# Patient Record
Sex: Male | Born: 1960 | Hispanic: No | Marital: Single | Smoking: Never smoker
Health system: Southern US, Community
[De-identification: ages and names within clinical notes are randomized; demographics above are authoritative.]

## PROBLEM LIST (undated history)

## (undated) DIAGNOSIS — I10 Essential (primary) hypertension: Secondary | ICD-10-CM

## (undated) DIAGNOSIS — F319 Bipolar disorder, unspecified: Secondary | ICD-10-CM

## (undated) DIAGNOSIS — F250 Schizoaffective disorder, bipolar type: Secondary | ICD-10-CM

## (undated) DIAGNOSIS — K759 Inflammatory liver disease, unspecified: Secondary | ICD-10-CM

## (undated) DIAGNOSIS — F32A Depression, unspecified: Secondary | ICD-10-CM

## (undated) DIAGNOSIS — F259 Schizoaffective disorder, unspecified: Secondary | ICD-10-CM

## (undated) DIAGNOSIS — F419 Anxiety disorder, unspecified: Secondary | ICD-10-CM

## (undated) DIAGNOSIS — F329 Major depressive disorder, single episode, unspecified: Secondary | ICD-10-CM

## (undated) DIAGNOSIS — E119 Type 2 diabetes mellitus without complications: Secondary | ICD-10-CM

## (undated) HISTORY — PX: WISDOM TOOTH EXTRACTION: SHX21

## (undated) HISTORY — PX: FINGER SURGERY: SHX640

---

## 2006-05-23 ENCOUNTER — Inpatient Hospital Stay (HOSPITAL_COMMUNITY): Admission: EM | Admit: 2006-05-23 | Discharge: 2006-06-01 | Payer: Self-pay | Admitting: *Deleted

## 2006-05-23 ENCOUNTER — Ambulatory Visit: Payer: Self-pay | Admitting: *Deleted

## 2010-06-06 ENCOUNTER — Inpatient Hospital Stay (HOSPITAL_COMMUNITY)
Admission: AD | Admit: 2010-06-06 | Discharge: 2010-06-18 | Payer: Self-pay | Source: Home / Self Care | Admitting: Psychiatry

## 2010-06-06 ENCOUNTER — Ambulatory Visit: Payer: Self-pay | Admitting: Psychiatry

## 2010-06-06 ENCOUNTER — Emergency Department (HOSPITAL_COMMUNITY): Admission: EM | Admit: 2010-06-06 | Discharge: 2010-06-06 | Payer: Self-pay | Admitting: Emergency Medicine

## 2010-09-21 ENCOUNTER — Inpatient Hospital Stay (HOSPITAL_COMMUNITY)
Admission: RE | Admit: 2010-09-21 | Discharge: 2010-09-30 | DRG: 885 | Disposition: A | Discharge status: Home / Self Care | Payer: PRIVATE HEALTH INSURANCE | Source: Ambulatory Visit | Attending: Psychiatry | Admitting: Psychiatry

## 2010-09-21 ENCOUNTER — Emergency Department (HOSPITAL_COMMUNITY)
Admission: EM | Admit: 2010-09-21 | Discharge: 2010-09-21 | Disposition: A | Discharge status: Other Acute Inpatient Hospital | Payer: Self-pay | Attending: Emergency Medicine | Admitting: Emergency Medicine

## 2010-09-21 DIAGNOSIS — Z818 Family history of other mental and behavioral disorders: Secondary | ICD-10-CM

## 2010-09-21 DIAGNOSIS — F3289 Other specified depressive episodes: Secondary | ICD-10-CM

## 2010-09-21 DIAGNOSIS — Z79899 Other long term (current) drug therapy: Secondary | ICD-10-CM | POA: Insufficient documentation

## 2010-09-21 DIAGNOSIS — R45851 Suicidal ideations: Secondary | ICD-10-CM | POA: Insufficient documentation

## 2010-09-21 DIAGNOSIS — F1910 Other psychoactive substance abuse, uncomplicated: Secondary | ICD-10-CM

## 2010-09-21 DIAGNOSIS — E119 Type 2 diabetes mellitus without complications: Secondary | ICD-10-CM | POA: Insufficient documentation

## 2010-09-21 DIAGNOSIS — R197 Diarrhea, unspecified: Secondary | ICD-10-CM

## 2010-09-21 DIAGNOSIS — Z5900 Homelessness unspecified: Secondary | ICD-10-CM

## 2010-09-21 DIAGNOSIS — E1149 Type 2 diabetes mellitus with other diabetic neurological complication: Secondary | ICD-10-CM

## 2010-09-21 DIAGNOSIS — R110 Nausea: Secondary | ICD-10-CM

## 2010-09-21 DIAGNOSIS — F29 Unspecified psychosis not due to a substance or known physiological condition: Secondary | ICD-10-CM

## 2010-09-21 DIAGNOSIS — B1920 Unspecified viral hepatitis C without hepatic coma: Secondary | ICD-10-CM

## 2010-09-21 DIAGNOSIS — E669 Obesity, unspecified: Secondary | ICD-10-CM

## 2010-09-21 DIAGNOSIS — Z8659 Personal history of other mental and behavioral disorders: Secondary | ICD-10-CM | POA: Insufficient documentation

## 2010-09-21 DIAGNOSIS — E1142 Type 2 diabetes mellitus with diabetic polyneuropathy: Secondary | ICD-10-CM

## 2010-09-21 DIAGNOSIS — F319 Bipolar disorder, unspecified: Secondary | ICD-10-CM | POA: Insufficient documentation

## 2010-09-21 DIAGNOSIS — F329 Major depressive disorder, single episode, unspecified: Secondary | ICD-10-CM

## 2010-09-21 DIAGNOSIS — F259 Schizoaffective disorder, unspecified: Principal | ICD-10-CM

## 2010-09-21 LAB — RAPID URINE DRUG SCREEN, HOSP PERFORMED
Benzodiazepines: NOT DETECTED
Tetrahydrocannabinol: POSITIVE — AB

## 2010-09-21 LAB — DIFFERENTIAL
Basophils Relative: 0 % (ref 0–1)
Lymphs Abs: 1.3 10*3/uL (ref 0.7–4.0)
Monocytes Relative: 9 % (ref 3–12)
Neutro Abs: 3.9 10*3/uL (ref 1.7–7.7)
Neutrophils Relative %: 68 % (ref 43–77)

## 2010-09-21 LAB — BASIC METABOLIC PANEL
BUN: 12 mg/dL (ref 6–23)
Chloride: 103 mEq/L (ref 96–112)
GFR calc non Af Amer: >60 mL/min (ref >60)
Potassium: 4 mEq/L (ref 3.5–5.1)
Sodium: 137 mEq/L (ref 135–145)

## 2010-09-21 LAB — CBC
Hemoglobin: 12.6 g/dL — ABNORMAL LOW (ref 13.0–17.0)
MCH: 29.2 pg (ref 26.0–34.0)
RBC: 4.32 MIL/uL (ref 4.22–5.81)
WBC: 5.8 10*3/uL (ref 4.0–10.5)

## 2010-09-21 LAB — ETHANOL: Alcohol, Ethyl (B): <5 mg/dL (ref 0–10)

## 2010-09-22 DIAGNOSIS — F329 Major depressive disorder, single episode, unspecified: Secondary | ICD-10-CM

## 2010-09-22 DIAGNOSIS — F3289 Other specified depressive episodes: Secondary | ICD-10-CM

## 2010-09-22 LAB — GLUCOSE, CAPILLARY
Glucose-Capillary: 223 mg/dL — ABNORMAL HIGH (ref 70–99)
Glucose-Capillary: 224 mg/dL — ABNORMAL HIGH (ref 70–99)
Glucose-Capillary: 227 mg/dL — ABNORMAL HIGH (ref 70–99)

## 2010-09-23 LAB — GLUCOSE, CAPILLARY
Glucose-Capillary: 246 mg/dL — ABNORMAL HIGH (ref 70–99)
Glucose-Capillary: 320 mg/dL — ABNORMAL HIGH (ref 70–99)

## 2010-09-23 LAB — HEMOGLOBIN A1C
Hgb A1c MFr Bld: 10.3 % — ABNORMAL HIGH (ref <5.7)
Mean Plasma Glucose: 249 mg/dL — ABNORMAL HIGH (ref <117)

## 2010-09-24 LAB — GLUCOSE, CAPILLARY
Glucose-Capillary: 227 mg/dL — ABNORMAL HIGH (ref 70–99)
Glucose-Capillary: 207 mg/dL — ABNORMAL HIGH (ref 70–99)
Glucose-Capillary: 187 mg/dL — ABNORMAL HIGH (ref 70–99)

## 2010-09-25 LAB — GLUCOSE, CAPILLARY: Glucose-Capillary: 122 mg/dL — ABNORMAL HIGH (ref 70–99)

## 2010-09-26 DIAGNOSIS — F329 Major depressive disorder, single episode, unspecified: Secondary | ICD-10-CM

## 2010-09-26 DIAGNOSIS — F3289 Other specified depressive episodes: Secondary | ICD-10-CM

## 2010-09-26 LAB — CBC
Hemoglobin: 12.5 g/dL — ABNORMAL LOW (ref 13.0–17.0)
MCH: 28.7 pg (ref 26.0–34.0)
MCHC: 33.4 g/dL (ref 30.0–36.0)
MCV: 86 fL (ref 78.0–100.0)
Platelets: 209 10*3/uL (ref 150–400)

## 2010-09-26 LAB — GLUCOSE, CAPILLARY
Glucose-Capillary: 189 mg/dL — ABNORMAL HIGH (ref 70–99)
Glucose-Capillary: 260 mg/dL — ABNORMAL HIGH (ref 70–99)

## 2010-09-26 LAB — BASIC METABOLIC PANEL
BUN: 9 mg/dL (ref 6–23)
CO2: 26 mEq/L (ref 19–32)
Calcium: 9 mg/dL (ref 8.4–10.5)
Creatinine, Ser: 0.95 mg/dL (ref 0.4–1.5)
GFR calc Af Amer: >60 mL/min (ref >60)

## 2010-09-27 LAB — GLUCOSE, CAPILLARY
Glucose-Capillary: 180 mg/dL — ABNORMAL HIGH (ref 70–99)
Glucose-Capillary: 165 mg/dL — ABNORMAL HIGH (ref 70–99)

## 2010-09-28 LAB — GLUCOSE, CAPILLARY: Glucose-Capillary: 80 mg/dL (ref 70–99)

## 2010-09-29 LAB — GLUCOSE, CAPILLARY: Glucose-Capillary: 165 mg/dL — ABNORMAL HIGH (ref 70–99)

## 2010-09-30 LAB — GLUCOSE, CAPILLARY
Glucose-Capillary: 196 mg/dL — ABNORMAL HIGH (ref 70–99)
Glucose-Capillary: 157 mg/dL — ABNORMAL HIGH (ref 70–99)

## 2010-10-03 NOTE — Discharge Summary (Signed)
[name]              ACCOUNT NO.:  [id number]  MEDICAL RECORD NO.:  [id number]           PATIENT TYPE:  I  LOCATION:  0504                          FACILITY:  [facility]  PHYSICIAN:  Marlis Edelson, DO        DATE OF BIRTH:  [birth year]  DATE OF ADMISSION:  09/21/2010 DATE OF DISCHARGE:  09/30/2010                              DISCHARGE SUMMARY   REASON FOR HOSPITALIZATION:  This was a 50 year old male that presented with depression, suicidal thoughts and psychotic symptoms.  Had had past multiple admissions for psychiatric illnesses.  FINAL DIAGNOSES:  Axis I:  Schizoaffective disorder, depressed, with psychosis.  Polysubstance abuse, in remission. Axis II:  Negative. Axis III:  A history of diabetes type 2 uncontrolled, hepatitis C untreated, diabetic neuropathy. Axis IV: Homelessness, financial problems and medical problems and access to care. Axis V: GAF is 55-60.  LABORATORY DATA:  CBC and differential was unremarkable, white count of 12.6, hematocrit 36.3.  Alcohol level was less than 5.  Glucose was elevated at 267.  SIGNIFICANT FINDINGS:  This is an alert, oriented male in x3, obese, endorsing suicidality with a plan to shoot himself.  Means were unavailable.  No history of any homicidality.  Speech was clear, coherent, goal-directed.  Mood was depressed.  Endorsed command and denigrating auditory hallucinations.  He appears to be of at least average intelligence.  HOSPITAL COURSE:  Our plan was to admit him to the adult milieu for 3-5 days for stabilization and monitor his blood sugars.  He continued to endorse hearing voices that were telling him he was no good and hurt yourself.  We had ordered Risperdal to control his psychotic symptoms and had trazodone for sleep.  The patient developed some GI symptoms, problems with nausea, vomiting, which he felt was the change in medications.  His blood sugars were elevated and we were monitoring his blood sugars with  sliding scale insulin.  Endorsing auditory hallucinations of command type to hurt himself.  He promised safety on the unit.  Lantus was initiated to monitor his blood sugars and we had Zofran and Imodium for his nausea and diarrhea.  The patient improved, was less nauseated, diarrhea had subsided.  His blood sugars were under better control.  He was doing well with his medications.  Denied any psychotic symptoms.  We were addressing placement and we continue with the Risperdal and trazodone.  He was beginning to feel better, attending groups, denying any suicidal or homicidal thoughts.  He was hoping to catch a train and go to [place] to stay with family.  CONDITION ON DISCHARGE:  The patient's GI symptoms had improved.  His diarrhea had stopped.  His heartburn was better with Protonix.  He denied any suicidal or homicidal thoughts or psychotic symptoms.  Sleep had improved.  His blood sugars were stabilizing.  Tolerating his medications and felt they were effective.  He was again feeling great. His appetite had improved.  The patient was fully alert and cooperative with casual dress, good eye contact and making futuristic plans for himself, asking appropriate questions.  Had plans for medical follow-up in  [place].  DISCHARGE MEDICATIONS: 1. Prozac 20 mg 2 tablets daily. 2. Protonix 40 mg daily. 3. Risperdal 1 mg taking 1 in the morning and 1-1/2 pills at bedtime. 4. Cogentin 1 mg daily. 5. Glipizide 10 mg daily. 6. Metformin 1000 mg 1 tablet the twice daily. 7. Trazodone 100 mg for sleep.  His follow-up appointments were at [facility], phone number [phone].  The patient had appointment on Monday at 5 p.m.     [name], N.P.   ______________________________ Marlis Edelson, DO    JO/MEDQ  D:  10/01/2010  T:  10/01/2010  Job:  [id number]  Electronically Signed by Limmie PatriciaP. on 10/02/2010 11:35:57 AM Electronically Signed by Marlis Edelson MD on  10/02/2010 08:30:30 PM

## 2010-10-08 LAB — GLUCOSE, CAPILLARY
Glucose-Capillary: 172 mg/dL — ABNORMAL HIGH (ref 70–99)
Glucose-Capillary: 196 mg/dL — ABNORMAL HIGH (ref 70–99)
Glucose-Capillary: 203 mg/dL — ABNORMAL HIGH (ref 70–99)
Glucose-Capillary: 204 mg/dL — ABNORMAL HIGH (ref 70–99)
Glucose-Capillary: 206 mg/dL — ABNORMAL HIGH (ref 70–99)
Glucose-Capillary: 216 mg/dL — ABNORMAL HIGH (ref 70–99)
Glucose-Capillary: 217 mg/dL — ABNORMAL HIGH (ref 70–99)
Glucose-Capillary: 220 mg/dL — ABNORMAL HIGH (ref 70–99)
Glucose-Capillary: 221 mg/dL — ABNORMAL HIGH (ref 70–99)
Glucose-Capillary: 232 mg/dL — ABNORMAL HIGH (ref 70–99)
Glucose-Capillary: 233 mg/dL — ABNORMAL HIGH (ref 70–99)
Glucose-Capillary: 237 mg/dL — ABNORMAL HIGH (ref 70–99)
Glucose-Capillary: 253 mg/dL — ABNORMAL HIGH (ref 70–99)
Glucose-Capillary: 258 mg/dL — ABNORMAL HIGH (ref 70–99)
Glucose-Capillary: 261 mg/dL — ABNORMAL HIGH (ref 70–99)
Glucose-Capillary: 282 mg/dL — ABNORMAL HIGH (ref 70–99)
Glucose-Capillary: 286 mg/dL — ABNORMAL HIGH (ref 70–99)
Glucose-Capillary: 309 mg/dL — ABNORMAL HIGH (ref 70–99)
Glucose-Capillary: 309 mg/dL — ABNORMAL HIGH (ref 70–99)
Glucose-Capillary: 311 mg/dL — ABNORMAL HIGH (ref 70–99)
Glucose-Capillary: 318 mg/dL — ABNORMAL HIGH (ref 70–99)
Glucose-Capillary: 318 mg/dL — ABNORMAL HIGH (ref 70–99)
Glucose-Capillary: 320 mg/dL — ABNORMAL HIGH (ref 70–99)
Glucose-Capillary: 336 mg/dL — ABNORMAL HIGH (ref 70–99)
Glucose-Capillary: 353 mg/dL — ABNORMAL HIGH (ref 70–99)
Glucose-Capillary: 271 mg/dL — ABNORMAL HIGH (ref 70–99)
Glucose-Capillary: 79 mg/dL (ref 70–99)

## 2010-10-08 LAB — DIFFERENTIAL
Basophils Absolute: 0 10*3/uL (ref 0.0–0.1)
Basophils Relative: 1 % (ref 0–1)
Eosinophils Absolute: 0.1 10*3/uL (ref 0.0–0.7)
Eosinophils Relative: 2 % (ref 0–5)
Monocytes Absolute: 0.4 10*3/uL (ref 0.1–1.0)
Neutro Abs: 2.8 10*3/uL (ref 1.7–7.7)

## 2010-10-08 LAB — CBC
HCT: 37.9 % — ABNORMAL LOW (ref 39.0–52.0)
MCH: 28.8 pg (ref 26.0–34.0)
MCHC: 33.5 g/dL (ref 30.0–36.0)
RDW: 12.6 % (ref 11.5–15.5)

## 2010-10-08 LAB — URINALYSIS, ROUTINE W REFLEX MICROSCOPIC
Bilirubin Urine: NEGATIVE
Hgb urine dipstick: NEGATIVE
Nitrite: NEGATIVE
Specific Gravity, Urine: 1.027 (ref 1.005–1.030)
pH: 5 (ref 5.0–8.0)

## 2010-10-08 LAB — HIV ANTIBODY (ROUTINE TESTING W REFLEX): HIV: NONREACTIVE

## 2010-10-08 LAB — BASIC METABOLIC PANEL
Chloride: 98 mEq/L (ref 96–112)
Creatinine, Ser: 0.89 mg/dL (ref 0.4–1.5)
GFR calc Af Amer: >60 mL/min (ref >60)

## 2010-10-08 LAB — HEPATITIS PANEL, ACUTE: Hep B C IgM: NEGATIVE

## 2010-10-08 LAB — LIPID PANEL
Cholesterol: 234 mg/dL — ABNORMAL HIGH (ref 0–200)
LDL Cholesterol: 155 mg/dL — ABNORMAL HIGH (ref 0–99)
Triglycerides: 118 mg/dL (ref <150)
VLDL: 24 mg/dL (ref 0–40)

## 2010-10-08 LAB — HEMOGLOBIN A1C
Hgb A1c MFr Bld: 8.7 % — ABNORMAL HIGH (ref <5.7)
Mean Plasma Glucose: 203 mg/dL — ABNORMAL HIGH (ref <117)

## 2010-10-08 LAB — HEPATIC FUNCTION PANEL
AST: 15 U/L (ref 0–37)
Albumin: 3.4 g/dL — ABNORMAL LOW (ref 3.5–5.2)
Alkaline Phosphatase: 59 U/L (ref 39–117)
Total Protein: 7.4 g/dL (ref 6.0–8.3)

## 2010-10-08 LAB — ETHANOL: Alcohol, Ethyl (B): <5 mg/dL (ref 0–10)

## 2010-11-05 NOTE — H&P (Signed)
NAMECALLOWAY, ANDRUS              ACCOUNT NO.:  [id number]  MEDICAL RECORD NO.:  [id number]           PATIENT TYPE:  I  LOCATION:  0505                          FACILITY:  [facility]  PHYSICIAN:  Verne Spurr, PA      DATE OF BIRTH:  07-24-1961  DATE OF ADMISSION:  09/21/2010 DATE OF DISCHARGE:                      PSYCHIATRIC ADMISSION ASSESSMENT   The patient is a 50 year old African American male who presented to the [facility] emergency room complaining of depression with suicidal ideation and psychosis. The patient's past medical history is significant for multiple hospital admissions for psychiatric issues including admission for substance abuse and depression, overdose with Flexeril, attempting to shoot himself as well as cutting.  He has  been seen primarily at  [facility] in [place], [place] as well as [facility] as recently as in November of this year.  SOCIAL HISTORY:  He reports himself as married, a high school graduate and is currently trying to pursue getting disability.  He was living with a cousin here in [place], but says he can no longer return there as they are using drugs.  He has a history of jail time, last in 2005 including violation of a protective order then.  Currently has no legal issues.  FAMILY HISTORY:  Significant for bipolar in a paternal aunt; a half- sister with schizoaffective disorder.  ALCOHOL AND DRUG HISTORY:  He denies any alcohol use in the last 15 years.  He says he last smoked pot 3 weeks ago.  He says his last use of cocaine was 3 months ago.  Medical care provider is the diabetic clinic at [facility] in [place].  CURRENT MEDICAL PROBLEMS:  Include diabetes, hepatitis C and diabetic neuropathy.  MEDICATIONS:  Were reported as: 1. Prozac 60 mg once a day. 2. Trazodone 100 mg at bedtime. 3. Vistaril 1 mg once a day. 4. Cogentin  1 mg once a day. 5. Metformin 1000 mg twice a  day. 6. Actos 45 mg q.a.m. 7. Glipizide 10 mg q.a.m.   Physical examination was done in the emergency room and noted to be allergic to Wellbutrin which causes swelling and hives.  PHYSICAL EXAMINATION:  Reveals that he was slightly hypertensive but is otherwise unremarkable.  Significant laboratory results included a positive urine drug screen that was positive for cannabis.  CBC and differential was unremarkable with a white count of 12.6, hematocrit 36.3, alcohol level less than 5. Metabolic profile showed a glucose of 267 and the patient endorsed that he had taken all of his medication yesterday but did not overdose.  MENTAL STATUS EXAM:  The patient is alert and oriented x3, obese African American male who endorses suicidality with a plan to shoot himself; means are unavailable at this time.  No history of homicidality.  Speech is clear, coherent and goal directed.  Mood is depressed.  He does endorse command and denigrating  auditory hallucinations and no visual hallucinations.  Cognition:  He appears to be of at least average intelligence.  ASSESSMENT:  AXIS I:  Schizoaffective disorder, depressed with psychosis and polysubstance abuse in remission. AXIS II:  Negative.  AXIS III:  Diabetes type 2, uncontrolled,  hepatitis C untreated, diabetic neuropathy. AXIS IV:  Homelessness, financial problems and problems with access to care. AXIS V:  Current global assessment of functioning is 40.  PLAN:  The patient will be admitted for 3-5 days for stabilization, restart medication.          ______________________________ Verne Spurr, PA     NM/MEDQ  D:  09/22/2010  T:  09/22/2010  Job:  [id number]  Electronically Signed by Verne Spurr  on 11/05/2010 09:49:20 AM
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
## 2010-12-13 NOTE — H&P (Signed)
NAMEBREAKER, SPRINGER              ACCOUNT NO.:  [id number]   MEDICAL RECORD NO.:  [id number]          PATIENT TYPE:  IPS   LOCATION:  0405                          FACILITY:  [facility]   PHYSICIAN:  Jasmine Pang, M.D. DATE OF BIRTH:  27-May-1961   DATE OF ADMISSION:  05/23/2006  DATE OF DISCHARGE:                         PSYCHIATRIC ADMISSION ASSESSMENT   IDENTIFYING INFORMATION:  This is an involuntary admission to the services  of Dr. Milford Cage.  However, the involuntary status is basically due for  transportation issues.  This is a 50 year old African-American male.  He  presented to [facility] yesterday around 3:30 in the afternoon  complaining that he wanted to hurt himself.  He states that he has  depression over a relationship.  He has suicidal ideation with a plan to  shoot himself.  He stated that the symptoms occurred gradually three days  ago and became worse just prior to arrival.  He was found at home by a  relative with a gun in hand, threatening to shoot himself.  He is a poor  historian.  Today, he states that his depression kicked back in  approximately four months ago when he left [place].  He left [place] due to  the heat, although he had gotten his old job back down there.  His UDS was  positive for cocaine in the emergency room.  He states that, although he  began using cocaine when he was 18, he has had several periods where he has  been able to completely stop.  He states that he relapsed eight days ago.  He does not understand why he still has cocaine in his urine and he states  that, prior to his relapse eight days ago, he had been clean for 28 months.   PAST PSYCHIATRIC HISTORY:  He states he was admitted to [facility] in  2005, [facility] in 2000 and 2003.   SOCIAL HISTORY:  He graduated high school in 1980.  He works in Air traffic controller.  He is currently employed at [employer].  He has never married.  He does  have a daughter who is  54.  He is currently living with his mother since he  moved back from [place] four months ago.   FAMILY HISTORY:  He states he has a half-sister on his father's side who  lives in [place] but who is in and out of the psychiatric ward quite a bit  and, on his mother's side, his maternal aunt was bipolar.  She has passed.   ALCOHOL/DRUG HISTORY:  He denies using cocaine except for a brief relapse  eight days ago.   PRIMARY CARE PHYSICIAN:  Dr. Scherrie Gerlach in [place].   MEDICAL HISTORY:  He is followed for diabetes mellitus.   MEDICATIONS:  He is currently prescribed Prozac 20 mg p.o. q.d., Seroquel 50  mg q.h.s., metformin 500 mg p.o. b.i.d., Actos 15 mg p.o. q.d. and glipizide  10 mg p.o. q.d.   ALLERGIES:  No known drug allergies.   PHYSICAL EXAMINATION:  He was medically cleared at [facility].  Essentially, this  is a  well-developed, well-nourished, African-American male in no acute  distress.   LABORATORY DATA:  His UDS was positive for cocaine.  His glucose was  elevated at 317 on admission.  His urinalysis was negative for leukocyte  esterase.   The remainder of his physical examination was unremarkable.  Well-documented  at [facility].  On admission to our unit, his vital signs showed that he is 68.5  inches tall.  He weighs 218 pounds.  Temperature is 96.8, blood pressure  125/80, pulse 61, respirations 16.   MENTAL STATUS EXAM:  Today, he is alert and oriented x3.  He is casually  dressed but appropriately groomed and nourished.  He has fair eye contact.  His speech is a normal rate, rhythm and tone.  His mood is somewhat  irritable.  His affect has a normal range.  Thought processes are clear,  rational and goal-oriented.  Concentration and memory are intact.  Judgment  and insight are fair.  Intelligence is at least average.  He is till  purporting to have suicidal and homicidal ideation.  He states that this is  due to auditory hallucinations telling him to hurt himself  and his  girlfriend.   DIAGNOSES:  AXIS I:  Cocaine abuse.  Depressive disorder not otherwise  specified.  Rule out bipolar disorder.  AXIS II:  Deferred.  AXIS III:  Diabetes.  AXIS IV:  Problems with primary support group.  AXIS V:  30.   PLAN:  To admit for safety and stabilization.  Will adjust his medications.  Will add Wellbutrin.  Continue the Prozac and the low dose Seroquel.  He  reports that his tongue felt thick when he was given 150 mg one time.  We  will identify counseling in [place].      [name], P.A.-C.      Jasmine Pang, M.D.  Electronically Signed    MD/MEDQ  D:  05/24/2006  T:  05/25/2006  Job:  [id number]

## 2011-01-07 ENCOUNTER — Emergency Department (HOSPITAL_COMMUNITY)
Admission: EM | Admit: 2011-01-07 | Discharge: 2011-01-08 | Disposition: A | Discharge status: Home / Self Care | Payer: Self-pay | Attending: Emergency Medicine | Admitting: Emergency Medicine

## 2011-01-07 DIAGNOSIS — R45850 Homicidal ideations: Secondary | ICD-10-CM | POA: Insufficient documentation

## 2011-01-07 DIAGNOSIS — F319 Bipolar disorder, unspecified: Secondary | ICD-10-CM | POA: Insufficient documentation

## 2011-01-07 DIAGNOSIS — F341 Dysthymic disorder: Secondary | ICD-10-CM | POA: Insufficient documentation

## 2011-01-07 DIAGNOSIS — R45851 Suicidal ideations: Secondary | ICD-10-CM | POA: Insufficient documentation

## 2011-01-07 DIAGNOSIS — Z8659 Personal history of other mental and behavioral disorders: Secondary | ICD-10-CM | POA: Insufficient documentation

## 2011-01-07 DIAGNOSIS — Z79899 Other long term (current) drug therapy: Secondary | ICD-10-CM | POA: Insufficient documentation

## 2011-01-07 DIAGNOSIS — E119 Type 2 diabetes mellitus without complications: Secondary | ICD-10-CM | POA: Insufficient documentation

## 2011-01-07 DIAGNOSIS — F1410 Cocaine abuse, uncomplicated: Secondary | ICD-10-CM | POA: Insufficient documentation

## 2011-01-07 LAB — COMPREHENSIVE METABOLIC PANEL
ALT: 19 U/L (ref 0–53)
AST: 19 U/L (ref 0–37)
Alkaline Phosphatase: 58 U/L (ref 39–117)
CO2: 28 mEq/L (ref 19–32)
GFR calc Af Amer: >60 mL/min (ref >60)
GFR calc non Af Amer: >60 mL/min (ref >60)
Glucose, Bld: 176 mg/dL — ABNORMAL HIGH (ref 70–99)
Potassium: 4.1 mEq/L (ref 3.5–5.1)
Sodium: 138 mEq/L (ref 135–145)

## 2011-01-07 LAB — RAPID URINE DRUG SCREEN, HOSP PERFORMED
Barbiturates: NOT DETECTED
Benzodiazepines: NOT DETECTED
Tetrahydrocannabinol: NOT DETECTED

## 2011-01-07 LAB — CBC
Hemoglobin: 12.6 g/dL — ABNORMAL LOW (ref 13.0–17.0)
Platelets: 233 10*3/uL (ref 150–400)
RBC: 4.41 MIL/uL (ref 4.22–5.81)
WBC: 9.7 10*3/uL (ref 4.0–10.5)

## 2011-01-08 ENCOUNTER — Inpatient Hospital Stay (HOSPITAL_COMMUNITY)
Admission: AD | Admit: 2011-01-08 | Discharge: 2011-01-13 | DRG: 885 | Disposition: A | Discharge status: Home / Self Care | Payer: PRIVATE HEALTH INSURANCE | Source: Ambulatory Visit | Attending: Psychiatry | Admitting: Psychiatry

## 2011-01-08 DIAGNOSIS — F1410 Cocaine abuse, uncomplicated: Secondary | ICD-10-CM

## 2011-01-08 DIAGNOSIS — B1920 Unspecified viral hepatitis C without hepatic coma: Secondary | ICD-10-CM

## 2011-01-08 DIAGNOSIS — F259 Schizoaffective disorder, unspecified: Secondary | ICD-10-CM

## 2011-01-08 DIAGNOSIS — F1910 Other psychoactive substance abuse, uncomplicated: Secondary | ICD-10-CM

## 2011-01-08 DIAGNOSIS — E1142 Type 2 diabetes mellitus with diabetic polyneuropathy: Secondary | ICD-10-CM

## 2011-01-08 DIAGNOSIS — Z6379 Other stressful life events affecting family and household: Secondary | ICD-10-CM

## 2011-01-08 DIAGNOSIS — R45851 Suicidal ideations: Secondary | ICD-10-CM

## 2011-01-08 DIAGNOSIS — Z5900 Homelessness unspecified: Secondary | ICD-10-CM

## 2011-01-08 DIAGNOSIS — F3289 Other specified depressive episodes: Secondary | ICD-10-CM

## 2011-01-08 DIAGNOSIS — F29 Unspecified psychosis not due to a substance or known physiological condition: Secondary | ICD-10-CM

## 2011-01-08 DIAGNOSIS — E1149 Type 2 diabetes mellitus with other diabetic neurological complication: Secondary | ICD-10-CM

## 2011-01-08 DIAGNOSIS — F1110 Opioid abuse, uncomplicated: Secondary | ICD-10-CM

## 2011-01-08 DIAGNOSIS — R45850 Homicidal ideations: Secondary | ICD-10-CM

## 2011-01-08 DIAGNOSIS — F329 Major depressive disorder, single episode, unspecified: Secondary | ICD-10-CM

## 2011-01-08 DIAGNOSIS — Z9283 Personal history of failed moderate sedation: Secondary | ICD-10-CM

## 2011-01-08 LAB — GLUCOSE, CAPILLARY: Glucose-Capillary: 150 mg/dL — ABNORMAL HIGH (ref 70–99)

## 2011-01-10 LAB — GLUCOSE, CAPILLARY
Glucose-Capillary: 234 mg/dL — ABNORMAL HIGH (ref 70–99)
Glucose-Capillary: 180 mg/dL — ABNORMAL HIGH (ref 70–99)

## 2011-01-11 LAB — GLUCOSE, CAPILLARY
Glucose-Capillary: 214 mg/dL — ABNORMAL HIGH (ref 70–99)
Glucose-Capillary: 188 mg/dL — ABNORMAL HIGH (ref 70–99)

## 2011-01-12 LAB — GLUCOSE, CAPILLARY
Glucose-Capillary: 195 mg/dL — ABNORMAL HIGH (ref 70–99)
Glucose-Capillary: 220 mg/dL — ABNORMAL HIGH (ref 70–99)

## 2011-02-04 NOTE — H&P (Signed)
NAMEGIANCARLO, [name]              ACCOUNT NO.:  [id number]  MEDICAL RECORD NO.:  [id number]  LOCATION:  0302                          FACILITY:  [facility]  PHYSICIAN:  Eulogio Ditch, MD DATE OF BIRTH:  April 22, 1961  DATE OF ADMISSION:  01/08/2011 DATE OF DISCHARGE:                      PSYCHIATRIC ADMISSION ASSESSMENT   This is a voluntary admission to the services of Dr. Rogers Blocker.  HISTORY OF PRESENT ILLNESS:  This is a 50 year old, married [place] American male.  He presented to the [facility] ED reporting that the last couple of days have been real bad for him, and he was thinking about hurting himself.  He stated that he wanted to shoot himself and states his father took the gun away.  He denied homicidal ideation. However, later on in the assessment, this changes to that he was going to shoot a guy with whom he was mad and kill him for messing with his family and then shoot himself in the head.  He states he has been struggling with vertigo and some other "hard things" lately.  He stated that his brother-in-law took the gun away from him, and he no longer has access to it.  His urine drug screen is positive for cocaine and opiates.  He states this was a one-time relapse.  He had no measurable alcohol.  His glucose was 176.  [facility].  He was most recently with [name] February 25 to September 30, 2010, and also back in November, November 10 to November 22.  SOCIAL HISTORY:  He is a high Garment/textile technologist in 1980.  He has been married once.  He has one daughter, age 58.  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  FAMILY HISTORY:  He states his sister abuses alcohol.  ALCOHOL AND DRUG HISTORY:  He states he has been using cocaine since his 40s, once every 3 months, 1-2 lines.  PRIMARY CARE PROVIDER:  None.  PSYCHIATRIC CARE:  None.  MEDICAL  PROBLEMS:  He has type 2 diabetes, and he currently has an ear infection.  MEDICATIONS: 1. He was started on amoxicillin 500 mg p.o. t.i.d. in the emergency     room. 2. He was prescribed Vicodin 5/500 two tablets twice a day as needed     p.r.n. for vertigo and ear pain.  That, again, was today. 3. Actos 45 mg 1 tablet p.o. daily. 4. Trazodone 100 mg at h.s. 5. Metformin 1000 mg b.i.d. 6. Glipizide XL 10 mg p.o. q.a.m. 7. Fluoxetine 40 mg p.o. daily. 8. Cogentin 1 mg p.o. daily. 9. He was discharged on Risperdal.  He does not mention that, but I     will reorder 2 mg at h.s.  DRUG ALLERGIES:  He says he is allergic to [facility].  He says that his tongue swells.  POSITIVE PHYSICAL FINDINGS:  He was medically cleared in the ED at [facility] as already stated.  His vital signs showed that he was afebrile, 97.4.  His blood pressure ranged from 111/68 to 124/89.  His pulse was 79-100, and respirations were 16-18.  UDS was positive for opiates and cocaine.  His glucose was elevated at 178.  He had no other remarkable findings.  Apparently, during his evaluation, he was noted to have an ear infections, especially the left side, and he was started on amoxicillin.  MENTAL STATUS EXAM:  He was examined in his room in bed.  It was 7:00 p.m.  He appears to be appropriately groomed, dressed and nourished. His speech is not pressured.  His mood is irritable.  Thought processes are clear, rational and goal-oriented.  He states he is not here for detox, that he is here because of his suicidal ideation.  When asked if he was still homicidal, he said, "yes."  He is not having any auditory or visual hallucinations.  His judgment and insight are fair.  His concentration and memory are superficially intact.  His intelligence is at least average.  DIAGNOSES:  Axis I:  Schizoaffective disorder, depressed with psychosis; polysubstance abuse; cocaine and perhaps the opiates. Axis II:   Deferred. Axis III:  Type 2 diabetes, uncontrolled; history for hepatitis C and diabetic neuropathy. Axis IV:  Issues with family discord, primary support and financial.  He may indeed be homeless again. Axis V:  40.  PLAN:  Admit for safety and stabilization.  His meds will be adjusted as indicated.  As already stated, we will restart his Risperdal.  The case manager will get to the bottom of his placement issues and employment issues.     [name], P.A.-C.   ______________________________ Eulogio Ditch, MD    MD/MEDQ  D:  01/08/2011  T:  01/08/2011  Job:  [id number]  Electronically Signed by Jaci Lazier ADAMS P.A.-C. on 02/01/2011 09:48:01 AM Electronically Signed by Eulogio Ditch  on 02/04/2011 11:20:50 AM

## 2011-02-18 NOTE — Discharge Summary (Signed)
[name], [name]              ACCOUNT NO.:  [id number]  MEDICAL RECORD NO.:  [id number]  LOCATION:  0302                          FACILITY:  [facility]  PHYSICIAN:  Eulogio Ditch, MD DATE OF BIRTH:  06-18-1961  DATE OF ADMISSION:  01/08/2011 DATE OF DISCHARGE:  01/13/2011                              DISCHARGE SUMMARY   IDENTIFYING INFORMATION:  This is a 50 year old African American male. This is a voluntary admission.  HISTORY OF PRESENT ILLNESS:  [name] presented with suicidal thoughts and plan to shoot himself.  Denied homicidal thoughts initially and later said he was also having thoughts of shooting a guy with whom he was quite angry and had thoughts of killing him and then subsequently shooting himself in the head.  He reported struggling with vertigo and admitted to relapsing on cocaine and opiates.  MEDICAL EVALUATION AND DIAGNOSTIC STUDIES:  He was medically evaluated in the emergency room where he presented afebrile with blood pressure 111/68, pulse 79 and respirations 16.  Normally-developed African American male with no acute neurologic findings.  Urine drug screen positive for opiates and cocaine.  CBC normal.  CHRONIC MEDICAL CONDITIONS:  Included type 2 diabetes uncontrolled for which he received irregular medical care.  History of hepatitis C and endorsed diabetic neuropathy in his lower extremities.  Was diagnosed with a ear infection in the emergency room and started on amoxicillin there.  COURSE OF HOSPITALIZATION:  He was admitted to our dual diagnosis unit and initially presented appropriately groomed and dressed with non pressured speech, irritable mood but clear, rational and goal-directed thinking.  He did endorse having homicidal thoughts.  Denied any hallucinations.  Concentration and memory were intact.  Intelligence average and cognition appeared intact.  He was initially given a working diagnosis of schizoaffective disorder, depressed type  with psychosis and polysubstance abuse.  He was initially started on Risperdal which he had taken previously at 2 mg p.o. q.h.s. and he was also started on metformin 500 mg b.i.d., placed on a diabetic diet and CBG's were checked regularly.  CBG's ranged during his stay from 130 mg/dL to 213 mg/dL throughout his stay.  He was provided with trazodone 100 mg p.o. q.h.s. and he slept well, tolerated the Risperdal well and did not require detox from alcohol or other substances.  He was started on Claritin 10 mg daily for perennial allergies symptoms.  By the 14th he was participating well in group.  He was interested in going to the [facility] which our case manager offered him and was accepted ultimately at [facility] in [facility] for residential treatment.  By the 18th he was in full contact with reality with no dangerous ideas and his sleep was good, appetite good.  Gauging his depression, moderate at a level of 5, clearly and convincingly denying any homicidal thoughts and felt ready for discharge.  He was discharged to [facility].  He continued to be mildly dizzy throughout his stay which was attributed to the vertigo which was also believed to be related to his ear infection.  This gradually improved. There were no falls and he required no assistance with gait or daily activities.  DISCHARGE/PLAN:  He was  picked up by [facility] on June 18 at 7:30 a.m. and was given instructions about a carbohydrate controlled diet.  DISCHARGE DIAGNOSIS:  AXIS I: Schizoaffective disorder depressed, polysubstance abuse. AXIS II:  No diagnosis. AXIS III:  Diabetes mellitus type 2, stabilized; hepatitis C by history and ear infection NOS. AXIS IV: Issues with family discord and homelessness. AXIS V: Current 55, past year not known.  DISCHARGE MEDICATIONS: 1. Loratadine 10 mg daily. 2. Risperdal 2 mg q.h.s. 3. Actos 45 mg daily. 4. Cogentin 1 mg daily. 5.  Fluoxetine 20 mg daily. 6. Glipizide 10 mg XL q.a.m. with morning meal. 7. Metformin 1000 mg twice daily. 8. He was instructed to discontinue the trazodone. 9. Finished an adequate course of amoxicillin. 10.Was instructed to stop hydrocodone.     Margaret A. Lorin Picket, N.P.   ______________________________ Eulogio Ditch, MD    MAS/MEDQ  D:  02/06/2011  T:  02/06/2011  Job:  [id number]  Electronically Signed by Kari Baars N.P. on 02/07/2011 09:05:42 AM Electronically Signed by Eulogio Ditch  on 02/18/2011 09:34:16 AM

## 2012-03-08 ENCOUNTER — Emergency Department (HOSPITAL_COMMUNITY)
Admission: EM | Admit: 2012-03-08 | Discharge: 2012-03-10 | Disposition: A | Discharge status: Other Acute Inpatient Hospital | Payer: Medicaid Other | Attending: Emergency Medicine | Admitting: Emergency Medicine

## 2012-03-08 ENCOUNTER — Encounter (HOSPITAL_COMMUNITY): Payer: Self-pay | Admitting: Emergency Medicine

## 2012-03-08 DIAGNOSIS — R51 Headache: Secondary | ICD-10-CM | POA: Insufficient documentation

## 2012-03-08 DIAGNOSIS — F319 Bipolar disorder, unspecified: Secondary | ICD-10-CM | POA: Insufficient documentation

## 2012-03-08 DIAGNOSIS — I10 Essential (primary) hypertension: Secondary | ICD-10-CM | POA: Insufficient documentation

## 2012-03-08 DIAGNOSIS — R45851 Suicidal ideations: Secondary | ICD-10-CM | POA: Insufficient documentation

## 2012-03-08 DIAGNOSIS — F209 Schizophrenia, unspecified: Secondary | ICD-10-CM | POA: Insufficient documentation

## 2012-03-08 DIAGNOSIS — E119 Type 2 diabetes mellitus without complications: Secondary | ICD-10-CM | POA: Insufficient documentation

## 2012-03-08 DIAGNOSIS — F17200 Nicotine dependence, unspecified, uncomplicated: Secondary | ICD-10-CM | POA: Insufficient documentation

## 2012-03-08 HISTORY — DX: Bipolar disorder, unspecified: F31.9

## 2012-03-08 HISTORY — DX: Schizoaffective disorder, unspecified: F25.9

## 2012-03-08 HISTORY — DX: Schizoaffective disorder, bipolar type: F25.0

## 2012-03-08 HISTORY — DX: Essential (primary) hypertension: I10

## 2012-03-08 LAB — GLUCOSE, CAPILLARY
Glucose-Capillary: 526 mg/dL — ABNORMAL HIGH (ref 70–99)
Glucose-Capillary: 336 mg/dL — ABNORMAL HIGH (ref 70–99)
Glucose-Capillary: 233 mg/dL — ABNORMAL HIGH (ref 70–99)
Glucose-Capillary: 262 mg/dL — ABNORMAL HIGH (ref 70–99)

## 2012-03-08 LAB — RAPID URINE DRUG SCREEN, HOSP PERFORMED
Amphetamines: NOT DETECTED
Benzodiazepines: NOT DETECTED
Cocaine: NOT DETECTED
Opiates: NOT DETECTED
Tetrahydrocannabinol: NOT DETECTED

## 2012-03-08 LAB — COMPREHENSIVE METABOLIC PANEL
ALT: 15 U/L (ref 0–53)
CO2: 26 mEq/L (ref 19–32)
Calcium: 9.1 mg/dL (ref 8.4–10.5)
Creatinine, Ser: 0.8 mg/dL (ref 0.50–1.35)
GFR calc Af Amer: >90 mL/min (ref >90)
GFR calc non Af Amer: >90 mL/min (ref >90)
Glucose, Bld: 574 mg/dL (ref 70–99)
Sodium: 132 mEq/L — ABNORMAL LOW (ref 135–145)
Total Protein: 6.9 g/dL (ref 6.0–8.3)

## 2012-03-08 LAB — CBC
HCT: 37 % — ABNORMAL LOW (ref 39.0–52.0)
Hemoglobin: 12.5 g/dL — ABNORMAL LOW (ref 13.0–17.0)
RBC: 4.33 MIL/uL (ref 4.22–5.81)
WBC: 4.5 10*3/uL (ref 4.0–10.5)

## 2012-03-08 LAB — ETHANOL: Alcohol, Ethyl (B): <11 mg/dL (ref 0–11)

## 2012-03-08 MED ORDER — INSULIN ASPART 100 UNIT/ML ~~LOC~~ SOLN: 7.0000 [IU] | Freq: Three times a day (TID) | SUBCUTANEOUS | Status: DC

## 2012-03-08 MED ORDER — SODIUM CHLORIDE 0.9 % IV BOLUS (SEPSIS)
1000.0000 mL | Freq: Once | INTRAVENOUS | Status: AC
  Administered 2012-03-08: 1000 mL via INTRAVENOUS

## 2012-03-08 MED ORDER — TRAZODONE HCL 100 MG PO TABS
100.0000 mg | ORAL_TABLET | Freq: Every day | ORAL | Status: DC
  Administered 2012-03-08 – 2012-03-09 (×2): 100 mg via ORAL
  Filled 2012-03-08 (×2): qty 1

## 2012-03-08 MED ORDER — INSULIN GLARGINE 100 UNIT/ML ~~LOC~~ SOLN
20.0000 [IU] | Freq: Every day | SUBCUTANEOUS | Status: DC
  Administered 2012-03-08 – 2012-03-09 (×2): 20 [IU] via SUBCUTANEOUS
  Filled 2012-03-08 (×2): qty 1

## 2012-03-08 MED ORDER — METFORMIN HCL 500 MG PO TABS
1000.0000 mg | ORAL_TABLET | Freq: Two times a day (BID) | ORAL | Status: DC
  Administered 2012-03-08 – 2012-03-10 (×5): 1000 mg via ORAL
  Filled 2012-03-08 (×4): qty 2
  Filled 2012-03-08: qty 1
  Filled 2012-03-08: qty 2

## 2012-03-08 MED ORDER — RISPERIDONE 0.5 MG PO TABS
2.0000 mg | ORAL_TABLET | Freq: Once | ORAL | Status: AC
  Administered 2012-03-08: 2 mg via ORAL
  Filled 2012-03-08: qty 4

## 2012-03-08 MED ORDER — FLUOXETINE HCL 20 MG PO CAPS: 80.0000 mg | ORAL_CAPSULE | Freq: Every day | ORAL | Status: DC

## 2012-03-08 MED ORDER — INSULIN ASPART 100 UNIT/ML ~~LOC~~ SOLN
16.0000 [IU] | Freq: Once | SUBCUTANEOUS | Status: AC
  Administered 2012-03-08: 16 [IU] via SUBCUTANEOUS
  Filled 2012-03-08: qty 1

## 2012-03-08 MED ORDER — FLUOXETINE HCL 20 MG PO CAPS
60.0000 mg | ORAL_CAPSULE | Freq: Every day | ORAL | Status: DC
  Administered 2012-03-08 – 2012-03-10 (×3): 60 mg via ORAL
  Filled 2012-03-08 (×3): qty 3

## 2012-03-08 MED ORDER — INSULIN ASPART 100 UNIT/ML ~~LOC~~ SOLN
0.0000 [IU] | Freq: Three times a day (TID) | SUBCUTANEOUS | Status: DC
  Administered 2012-03-08 – 2012-03-09 (×3): 5 [IU] via SUBCUTANEOUS
  Administered 2012-03-09: 8 [IU] via SUBCUTANEOUS
  Filled 2012-03-08 (×3): qty 1

## 2012-03-08 MED ORDER — INSULIN ASPART 100 UNIT/ML ~~LOC~~ SOLN: 14.0000 [IU] | Freq: Once | SUBCUTANEOUS | Status: DC

## 2012-03-08 MED ORDER — RISPERIDONE 0.5 MG PO TABS: 3.0000 mg | ORAL_TABLET | Freq: Every day | ORAL | Status: DC

## 2012-03-08 MED ORDER — SODIUM CHLORIDE 0.9 % IV BOLUS (SEPSIS): 1000.0000 mL | Freq: Once | INTRAVENOUS | Status: DC

## 2012-03-08 NOTE — ED Notes (Addendum)
Pt states "I have been dealing with the passing of my grandmother and having a hard time with other family members in moving her stuff. I just feel like hurting myself." Pt states he has been taking his medications daily as prescribed but feels they aren't working. Admits to having SI in past. Pt states "I was going to shoot myself last night but my cousin took the gun" Pt reports he has not been able to sleep in the past 3 days.

## 2012-03-08 NOTE — ED Notes (Signed)
Panic value reported to MD. Report called to [facility] on Pod C

## 2012-03-08 NOTE — ED Notes (Signed)
Notified RN of CBG [id number]

## 2012-03-08 NOTE — BH Assessment (Signed)
Assessment Note   [name] is an 51 y.o. male that presents to [facility] reporting SI with an attempt last night.  Pt stated he got a gun from his cousins house while he was visiting last night and wanted to shoot himself, but his cousin took the gun from ho=im.  Pt had a previous attempt last year this time and was admitted to [facility].  Pt continues to endorse SI and can't contract for safety.  Pt also endorses HI and stated voices tell him to hurt himself and others.  Pt stated last night, he wanted to kill his brother-in-law, but now states the voices tell him to hurt "anyone."  Pt stated the SI and auditory hallucinations have been worsening and he takes his meds as prescribed, but they are not helping.  Pt stated he started feeling worse after the death of his grandmother 3 months ago.  He stated that his family has been arguing over his grandmother's possessions and this upset him.  He was living with his mother and moved to [place] 3 weeks ago and is now in a homeless shelter.  Pt has a history of inpatient hospitalizations for SI and SA (pt denies current SA).  Pt receives his meds from [facility].  Pt endorses sx of depression and anxiety.  Completed assessment, assessment notification and faxed to [facility] to run for possible admission.  Updated ED staff.  Axis I: Schizoaffective Disorder Axis II: Deferred Axis III:  Past Medical History  Diagnosis Date  . Diabetes mellitus   . Bipolar affective disorder   . Schizophrenia, schizo-affective   . Hypertension    Axis IV: economic problems, housing problems, other psychosocial or environmental problems, problems related to social environment and problems with primary support group Axis V: 11-20 some danger of hurting self or others possible OR occasionally fails to maintain minimal personal hygiene OR gross impairment in communication  Past Medical History:  Past Medical History  Diagnosis Date  . Diabetes mellitus   . Bipolar affective disorder    . Schizophrenia, schizo-affective   . Hypertension     No past surgical history on file.  Family History: No family history on file.  Social History:  reports that he has been smoking Cigarettes.  He does not have any smokeless tobacco history on file. He reports that he drinks alcohol. He reports that he uses illicit drugs (Marijuana).  Additional Social History:  Alcohol / Drug Use Pain Medications: na Prescriptions: see list Over the Counter: see list History of alcohol / drug use?: Yes (Denies current use) Longest period of sobriety (when/how long): unknown Negative Consequences of Use:  (pt denies) Withdrawal Symptoms:  (na)  CIWA: CIWA-Ar BP: 139/86 mmHg Pulse Rate: 64  COWS:    Allergies:  Allergies  Allergen Reactions  . Wellbutrin (Bupropion) Swelling    Home Medications:  (Not in a hospital admission)  OB/GYN Status:  No LMP for male patient.  General Assessment Data Location of Assessment: [facility] ED Living Arrangements: Other (Comment) (Homeless shelter) Can pt return to current living arrangement?: Yes Admission Status: Voluntary Is patient capable of signing voluntary admission?: Yes Transfer from: Acute Hospital Referral Source: Self/Family/Friend  Education Status Is patient currently in school?: No  Risk to self Suicidal Ideation: Yes-Currently Present Suicidal Intent: Yes-Currently Present Is patient at risk for suicide?: Yes Suicidal Plan?: Yes-Currently Present Specify Current Suicidal Plan: Had gun to shoot self last night Access to Means: No What has been your use of drugs/alcohol within the  last 12 months?: Denies current use Previous Attempts/Gestures: Yes How many times?: 1  (Had gun to shoot self last year) Other Self Harm Risks: Pt denies Triggers for Past Attempts: Family contact Intentional Self Injurious Behavior: None (Pt denies) Family Suicide History: No Recent stressful life event(s): Conflict (Comment);Other (Comment)  (Grandmother recently passed, conflict with family about her) Persecutory voices/beliefs?: No Depression: Yes Depression Symptoms: Despondent;Insomnia;Loss of interest in usual pleasures;Feeling worthless/self pity;Feeling angry/irritable Substance abuse history and/or treatment for substance abuse?: Yes Suicide prevention information given to non-admitted patients: Not applicable  Risk to Others Homicidal Ideation: Yes-Currently Present Thoughts of Harm to Others: Yes-Currently Present Comment - Thoughts of Harm to Others: Pt reported has thoughts of harming anyone, last night, it was his brother-in-law Current Homicidal Intent: Yes-Currently Present Current Homicidal Plan: No Access to Homicidal Means: No Identified Victim: Brother-in-las last night, "anyone" currently History of harm to others?: No Assessment of Violence: None Noted Violent Behavior Description: na - pt calm, cooperative Does patient have access to weapons?: No Criminal Charges Pending?: No Does patient have a court date: Yes Court Date:  (na)  Psychosis Hallucinations: Auditory;With command (Hears voices telling him to hurt himself and others) Delusions: None noted  Mental Status Report Appear/Hygiene: Disheveled Eye Contact: Fair Motor Activity: Unremarkable Speech: Logical/coherent;Soft Level of Consciousness: Quiet/awake Mood: Depressed Affect: Appropriate to circumstance Anxiety Level: Panic Attacks Panic attack frequency: "Not very often" Most recent panic attack: Yesterday Thought Processes: Coherent;Relevant Judgement: Impaired Orientation: Person;Place;Time;Situation Obsessive Compulsive Thoughts/Behaviors: None  Cognitive Functioning Concentration: Decreased Memory: Recent Intact;Remote Intact IQ: Average Insight: Poor Impulse Control: Fair Appetite: Fair Weight Loss: 0  Weight Gain: 0  Sleep: Decreased Total Hours of Sleep:  (Reports has not slept in several days) Vegetative  Symptoms: None  ADLScreening [facility]) Patient's cognitive ability adequate to safely complete daily activities?: Yes Patient able to express need for assistance with ADLs?: Yes Independently performs ADLs?: Yes  Abuse/Neglect [facility]) Physical Abuse: Denies Verbal Abuse: Denies Sexual Abuse: Denies  Prior Inpatient Therapy Prior Inpatient Therapy: Yes Prior Therapy Dates: 2004, 2008, 2010, 2011, 2012 Prior Therapy Facilty/Provider(s): [facility], [facility] [facility], [place], [facility] Reason for Treatment: SI/depression/SA  Prior Outpatient Therapy Prior Outpatient Therapy: Yes Prior Therapy Dates: 2004, 2012 - Current Prior Therapy Facilty/Provider(s): [employer] Reason for Treatment: SA, med mgnt  ADL Screening (condition at time of admission) Patient's cognitive ability adequate to safely complete daily activities?: Yes Patient able to express need for assistance with ADLs?: Yes Independently performs ADLs?: Yes Weakness of Legs: None Weakness of Arms/Hands: None  Home Assistive Devices/Equipment Home Assistive Devices/Equipment: None    Abuse/Neglect Assessment (Assessment to be complete while patient is alone) Physical Abuse: Denies Verbal Abuse: Denies Sexual Abuse: Denies Exploitation of patient/patient's resources: Denies Self-Neglect: Denies Values / Beliefs Cultural Requests During Hospitalization: None Spiritual Requests During Hospitalization: None Consults Spiritual Care Consult Needed: No Social Work Consult Needed: No Merchant navy officer (For Healthcare) Advance Directive: Patient does not have advance directive;Patient would not like information    Additional Information 1:1 In Past 12 Months?: No CIRT Risk: No Elopement Risk: No Does patient have medical clearance?: Yes     Disposition:  Disposition Disposition of Patient: Referred to;Inpatient treatment program Type of inpatient treatment program:  Adult Patient referred to: Other (Comment) ([facility]ending [facility])  On Site Evaluation by:   Reviewed with Physician:  Valene Bors, Rennis Harding 03/08/2012 4:36 PM

## 2012-03-08 NOTE — ED Provider Notes (Addendum)
History  This chart was scribed for Joseph Roots, MD by Bennett Scrape. This patient was seen in room D35C/D35C and the patient's care was started at 8:27AM.  CSN: [id number]  Arrival date & time 03/08/12  0818   First MD Initiated Contact with Patient 03/08/12 0827      Chief Complaint  Patient presents with  . Medical Clearance     The history is provided by the patient. No language interpreter was used.    [name] is a 51 y.o. male with a h/o schizophrenia and bipolar disorder who presents to the Emergency Department complaining of 4 days of gradual onset, gradually worsening, constant depression with associated SI and 3 days of sleep depravation. He states that started after his grandmother passed away one month ago and his family began to bicker over her possessions. He states that his plan was to shot himself last night but reports that his cousin took the gun away from him. He reports prior episodes of similar depression. He reports that he has been taking psychiatric medications as prescribed without improvement. His last admission for psychiatric  Issues was over 3 months ago. He also c/o HA for the past 4 or 5 days but denies fever, CP, SOB, cough, abdominal pain and sore throat as associated symptoms. He denies any recent marijuana or cocaine use. He has a h/o diabetes and reports that his CBG level has been elevated. He reports that he has been taking insulin and eating and drinking regularly. He is a current everyday smoker and occasional alcohol user.  Moved to [place] 3 weeks ago. From [place], [place].  Dr. Doreatha Martin is PCP.  Past Medical History  Diagnosis Date  . Diabetes mellitus   . Bipolar affective disorder   . Schizophrenia, schizo-affective   . Hypertension     No past surgical history on file.  No family history on file.  History  Substance Use Topics  . Smoking status: Current Everyday Smoker    Types: Cigarettes  . Smokeless tobacco: Not on  file  . Alcohol Use: Yes      Review of Systems  Constitutional: Negative for fever and chills.  HENT: Negative for congestion and sore throat.   Respiratory: Negative for cough and shortness of breath.   Cardiovascular: Negative for chest pain.  Gastrointestinal: Negative for nausea, vomiting, abdominal pain and diarrhea.  Psychiatric/Behavioral: Positive for suicidal ideas.  All other systems reviewed and are negative.    Allergies  Wellbutrin  Home Medications  No current outpatient prescriptions on file.  Triage Vitals: BP 134/70  Pulse 66  Temp 98.1 F (36.7 C) (Oral)  Resp 18  SpO2 99%  Physical Exam  Nursing note and vitals reviewed. Constitutional: He is oriented to person, place, and time. He appears well-developed and well-nourished. No distress.  HENT:  Head: Normocephalic and atraumatic.  Eyes: Conjunctivae and EOM are normal.  Neck: Neck supple. No tracheal deviation present.  Cardiovascular: Normal rate, regular rhythm and normal heart sounds.   Pulmonary/Chest: Effort normal and breath sounds normal. No respiratory distress.  Abdominal: Soft. Bowel sounds are normal. There is no tenderness.  Musculoskeletal: Normal range of motion. He exhibits no edema.  Neurological: He is alert and oriented to person, place, and time. No cranial nerve deficit.  Skin: Skin is warm and dry.  Psychiatric: He has a normal mood and affect. His behavior is normal.    ED Course  Procedures (including critical care time)  DIAGNOSTIC STUDIES: Oxygen Saturation  is 99% on room air, normal by my interpretation.    COORDINATION OF CARE: 8:39AM-Discussed treatment plan which includes ACT evaluation with pt at bedside and pt agreed to plan.  Results for orders placed during the hospital encounter of 03/08/12  COMPREHENSIVE METABOLIC PANEL      Component Value Range   Sodium 132 (*) 135 - 145 mEq/L   Potassium 4.2  3.5 - 5.1 mEq/L   Chloride 96  96 - 112 mEq/L   CO2 26   19 - 32 mEq/L   Glucose, Bld 574 (*) 70 - 99 mg/dL   BUN 14  6 - 23 mg/dL   Creatinine, Ser 1.61  0.50 - 1.35 mg/dL   Calcium 9.1  8.4 - 09.6 mg/dL   Total Protein 6.9  6.0 - 8.3 g/dL   Albumin 3.2 (*) 3.5 - 5.2 g/dL   AST 14  0 - 37 U/L   ALT 15  0 - 53 U/L   Alkaline Phosphatase 75  39 - 117 U/L   Total Bilirubin 0.3  0.3 - 1.2 mg/dL   GFR calc non Af Amer >90  >90 mL/min   GFR calc Af Amer >90  >90 mL/min  CBC      Component Value Range   WBC 4.5  4.0 - 10.5 K/uL   RBC 4.33  4.22 - 5.81 MIL/uL   Hemoglobin 12.5 (*) 13.0 - 17.0 g/dL   HCT 04.5 (*) 40.9 - 81.1 %   MCV 85.5  78.0 - 100.0 fL   MCH 28.9  26.0 - 34.0 pg   MCHC 33.8  30.0 - 36.0 g/dL   RDW 91.4  78.2 - 95.6 %   Platelets 187  150 - 400 K/uL  ETHANOL      Component Value Range   Alcohol, Ethyl (B) <11  0 - 11 mg/dL  URINE RAPID DRUG SCREEN (HOSP PERFORMED)      Component Value Range   Opiates NONE DETECTED  NONE DETECTED   Cocaine NONE DETECTED  NONE DETECTED   Benzodiazepines NONE DETECTED  NONE DETECTED   Amphetamines NONE DETECTED  NONE DETECTED   Tetrahydrocannabinol NONE DETECTED  NONE DETECTED   Barbiturates NONE DETECTED  NONE DETECTED  GLUCOSE, CAPILLARY      Component Value Range   Glucose-Capillary 526 (*) 70 - 99 mg/dL   Comment 1 Notify RN         MDM  I personally performed the services described in this documentation, which was scribed in my presence. The recorded information has been reviewed and considered. Joseph Roots, MD   Reviewed nursing notes and prior charts for additional history.   Labs sent, will get psychiatrist eval/consult.   bs high, hco3 normal.   Iv ns bolus. novolog subcut.        Joseph Roots, MD 03/08/12 2130  Joseph Roots, MD 03/08/12 1009

## 2012-03-08 NOTE — ED Notes (Signed)
Dinner Ordered 

## 2012-03-09 LAB — GLUCOSE, CAPILLARY: Glucose-Capillary: 215 mg/dL — ABNORMAL HIGH (ref 70–99)

## 2012-03-09 MED ORDER — ACETAMINOPHEN 325 MG PO TABS
650.0000 mg | ORAL_TABLET | ORAL | Status: DC | PRN
  Administered 2012-03-09 – 2012-03-10 (×2): 650 mg via ORAL
  Filled 2012-03-09 (×2): qty 2

## 2012-03-09 MED ORDER — INSULIN ASPART 100 UNIT/ML ~~LOC~~ SOLN
5.0000 [IU] | Freq: Three times a day (TID) | SUBCUTANEOUS | Status: DC
  Administered 2012-03-10: 11 [IU] via SUBCUTANEOUS
  Administered 2012-03-10: 7 [IU] via SUBCUTANEOUS
  Administered 2012-03-10: 8 [IU] via SUBCUTANEOUS
  Filled 2012-03-09: qty 1
  Filled 2012-03-09: qty 11
  Filled 2012-03-09: qty 1

## 2012-03-09 MED ORDER — ACETAMINOPHEN 325 MG PO TABS
650.0000 mg | ORAL_TABLET | Freq: Once | ORAL | Status: AC
  Administered 2012-03-09: 650 mg via ORAL
  Filled 2012-03-09: qty 2

## 2012-03-09 NOTE — ED Notes (Signed)
Pt slept well overnight and says he feels better this morning.

## 2012-03-09 NOTE — ED Notes (Signed)
Pt lying in bed.Pt says he is feeling sad and doesn't want to talk.

## 2012-03-09 NOTE — ED Notes (Signed)
Patient lying in bed with eyes open  States he needs something for a headache.

## 2012-03-10 ENCOUNTER — Inpatient Hospital Stay (HOSPITAL_COMMUNITY)
Admission: AD | Admit: 2012-03-10 | Discharge: 2012-03-17 | DRG: 885 | Disposition: A | Discharge status: Home / Self Care | Payer: Medicaid Other | Source: Ambulatory Visit | Attending: Psychiatry | Admitting: Psychiatry

## 2012-03-10 ENCOUNTER — Encounter (HOSPITAL_COMMUNITY): Payer: Self-pay

## 2012-03-10 DIAGNOSIS — F319 Bipolar disorder, unspecified: Secondary | ICD-10-CM

## 2012-03-10 DIAGNOSIS — F1210 Cannabis abuse, uncomplicated: Secondary | ICD-10-CM | POA: Diagnosis present

## 2012-03-10 DIAGNOSIS — E119 Type 2 diabetes mellitus without complications: Secondary | ICD-10-CM | POA: Diagnosis present

## 2012-03-10 DIAGNOSIS — Z79899 Other long term (current) drug therapy: Secondary | ICD-10-CM

## 2012-03-10 DIAGNOSIS — F251 Schizoaffective disorder, depressive type: Secondary | ICD-10-CM

## 2012-03-10 DIAGNOSIS — Z794 Long term (current) use of insulin: Secondary | ICD-10-CM

## 2012-03-10 DIAGNOSIS — F1410 Cocaine abuse, uncomplicated: Secondary | ICD-10-CM | POA: Diagnosis present

## 2012-03-10 DIAGNOSIS — F259 Schizoaffective disorder, unspecified: Principal | ICD-10-CM | POA: Diagnosis present

## 2012-03-10 DIAGNOSIS — I10 Essential (primary) hypertension: Secondary | ICD-10-CM | POA: Diagnosis present

## 2012-03-10 HISTORY — DX: Bipolar disorder, unspecified: F31.9

## 2012-03-10 HISTORY — DX: Type 2 diabetes mellitus without complications: E11.9

## 2012-03-10 LAB — GLUCOSE, CAPILLARY
Glucose-Capillary: 245 mg/dL — ABNORMAL HIGH (ref 70–99)
Glucose-Capillary: 144 mg/dL — ABNORMAL HIGH (ref 70–99)
Glucose-Capillary: 191 mg/dL — ABNORMAL HIGH (ref 70–99)

## 2012-03-10 MED ORDER — MAGNESIUM HYDROXIDE 400 MG/5ML PO SUSP: 30.0000 mL | Freq: Every day | ORAL | Status: DC | PRN

## 2012-03-10 MED ORDER — ALUM & MAG HYDROXIDE-SIMETH 200-200-20 MG/5ML PO SUSP: 30.0000 mL | ORAL | Status: DC | PRN

## 2012-03-10 MED ORDER — ONDANSETRON HCL 8 MG PO TABS
4.0000 mg | ORAL_TABLET | Freq: Three times a day (TID) | ORAL | Status: DC | PRN
  Administered 2012-03-10: 4 mg via ORAL
  Filled 2012-03-10: qty 1

## 2012-03-10 MED ORDER — INSULIN GLARGINE 100 UNIT/ML ~~LOC~~ SOLN
20.0000 [IU] | Freq: Every day | SUBCUTANEOUS | Status: DC
  Administered 2012-03-11: 20 [IU] via SUBCUTANEOUS

## 2012-03-10 MED ORDER — TRAZODONE HCL 100 MG PO TABS
100.0000 mg | ORAL_TABLET | Freq: Every day | ORAL | Status: DC
  Filled 2012-03-10 (×7): qty 1
  Filled 2012-03-10: qty 14
  Filled 2012-03-10: qty 1

## 2012-03-10 MED ORDER — ONDANSETRON HCL 4 MG/2ML IJ SOLN
4.0000 mg | Freq: Three times a day (TID) | INTRAMUSCULAR | Status: DC | PRN
  Filled 2012-03-10: qty 2

## 2012-03-10 MED ORDER — METFORMIN HCL 500 MG PO TABS
1000.0000 mg | ORAL_TABLET | Freq: Two times a day (BID) | ORAL | Status: DC
  Administered 2012-03-11 (×2): 1000 mg via ORAL
  Filled 2012-03-10 (×6): qty 2

## 2012-03-10 MED ORDER — SALINE SPRAY 0.65 % NA SOLN
1.0000 | Freq: Once | NASAL | Status: AC
  Administered 2012-03-10: 1 via NASAL
  Filled 2012-03-10: qty 44

## 2012-03-10 MED ORDER — ACETAMINOPHEN 325 MG PO TABS
650.0000 mg | ORAL_TABLET | Freq: Four times a day (QID) | ORAL | Status: DC | PRN
  Administered 2012-03-11: 650 mg via ORAL

## 2012-03-10 NOTE — BH Assessment (Signed)
[facility] Assessment Progress Note      Pt has been accepted to Dr. Allena Katz, bed 403.02.  All paperwork completed and faxed to [facility].  Dr. And nursing staff notified of disposition and will prepare pt for tranfer.  Bed is available.  Transfer pending.

## 2012-03-10 NOTE — ED Notes (Signed)
Advised by day shift RN that [facility] requested that they will be ready to receive the patient at 2030.

## 2012-03-10 NOTE — BH Assessment (Signed)
Assessment Note   [name] is an 51 y.o. male.  Patient has been accepted to [facility] pending bed availability.  Pt was made aware of this.  He also was asked about whether he was still thinking about shooting himself.  Patient says "I still do off and on."  He still is having command hallucinations to harm others.  He does say that he does not want to harm others at this time.  On-coming clinician will continue to inquire into bed status. Previous Note: [name] is an 51 y.o. male that presents to [facility] reporting SI with an attempt last night. Pt stated he got a gun from his cousins house while he was visiting last night and wanted to shoot himself, but his cousin took the gun from ho=im. Pt had a previous attempt last year this time and was admitted to [facility]. Pt continues to endorse SI and can't contract for safety. Pt also endorses HI and stated voices tell him to hurt himself and others. Pt stated last night, he wanted to kill his brother-in-law, but now states the voices tell him to hurt "anyone." Pt stated the SI and auditory hallucinations have been worsening and he takes his meds as prescribed, but they are not helping. Pt stated he started feeling worse after the death of his grandmother 3 months ago. He stated that his family has been arguing over his grandmother's possessions and this upset him. He was living with his mother and moved to [place] 3 weeks ago and is now in a homeless shelter. Pt has a history of inpatient hospitalizations for SI and SA (pt denies current SA). Pt receives his meds from [facility]. Pt endorses sx of depression and anxiety. Completed assessment, assessment notification and faxed to [facility] to run for possible admission. Updated ED staff.  Axis I: Psychotic Disorder NOS Axis II: Deferred Axis III:  Past Medical History  Diagnosis Date  . Diabetes mellitus   . Bipolar affective disorder   . Schizophrenia, schizo-affective   . Hypertension    Axis IV: economic  problems, housing problems, occupational problems and problems with primary support group Axis V: 31-40 impairment in reality testing Past Medical History:  Past Medical History  Diagnosis Date  . Diabetes mellitus   . Bipolar affective disorder   . Schizophrenia, schizo-affective   . Hypertension     No past surgical history on file.  Family History: No family history on file.  Social History:  reports that he has been smoking Cigarettes.  He does not have any smokeless tobacco history on file. He reports that he drinks alcohol. He reports that he uses illicit drugs (Marijuana).  Additional Social History:  Alcohol / Drug Use Pain Medications: na Prescriptions: see list Over the Counter: see list History of alcohol / drug use?: Yes (Denies current use) Longest period of sobriety (when/how long): unknown Negative Consequences of Use:  (pt denies) Withdrawal Symptoms:  (na)  CIWA: CIWA-Ar BP: 135/81 mmHg Pulse Rate: 68  COWS:    Allergies:  Allergies  Allergen Reactions  . Wellbutrin (Bupropion) Swelling    Home Medications:  (Not in a hospital admission)  OB/GYN Status:  No LMP for male patient.  General Assessment Data Location of Assessment: [facility] ED Living Arrangements: Other (Comment) (Homeless shelter) Can pt return to current living arrangement?: Yes Admission Status: Voluntary Is patient capable of signing voluntary admission?: Yes Transfer from: Acute Hospital Referral Source: Self/Family/Friend  Education Status Is patient currently in school?: No  Risk  to self Suicidal Ideation: Yes-Currently Present Suicidal Intent: Yes-Currently Present Is patient at risk for suicide?: Yes Suicidal Plan?: Yes-Currently Present Specify Current Suicidal Plan: On 08/11 had a gun to shoot himself Access to Means: No What has been your use of drugs/alcohol within the last 12 months?: Denies current use Previous Attempts/Gestures: Yes How many times?: 1  (Had a gun to  shoot self last year) Other Self Harm Risks: Pt denies Triggers for Past Attempts: Family contact Intentional Self Injurious Behavior: None Family Suicide History: No Recent stressful life event(s): Conflict (Comment) (Grandmother died, conflict in family about her belongings) Persecutory voices/beliefs?: No Depression: Yes Depression Symptoms: Despondent;Insomnia;Loss of interest in usual pleasures;Feeling worthless/self pity Substance abuse history and/or treatment for substance abuse?: Yes Suicide prevention information given to non-admitted patients: Not applicable  Risk to Others Homicidal Ideation: Yes-Currently Present Thoughts of Harm to Others: No (Thoughts "not as much as before.") Comment - Thoughts of Harm to Others: Previous thoughts to harm anyone Current Homicidal Intent: Yes-Currently Present Current Homicidal Plan: No Access to Homicidal Means: No Identified Victim: Brother-in-law previously History of harm to others?: No Assessment of Violence: None Noted Violent Behavior Description: Pt calm and cooperative now Does patient have access to weapons?: No Criminal Charges Pending?: No Does patient have a court date: No Court Date:  (N/A)  Psychosis Hallucinations: Auditory (Still having some command hallucinations) Delusions: None noted  Mental Status Report Appear/Hygiene: Improved Eye Contact: Fair Motor Activity: Freedom of movement;Unremarkable Speech: Logical/coherent;Soft Level of Consciousness: Quiet/awake Mood: Depressed;Sad Affect: Appropriate to circumstance Anxiety Level: Panic Attacks Panic attack frequency: "Not often" Most recent panic attack: On 08/11 Thought Processes: Coherent;Relevant Judgement: Impaired Orientation: Person;Place;Time;Situation Obsessive Compulsive Thoughts/Behaviors: None  Cognitive Functioning Concentration: Decreased Memory: Recent Intact;Remote Intact IQ: Average Insight: Poor Impulse Control: Fair Appetite:  Fair Weight Loss: 0  Weight Gain: 0  Sleep: Decreased Total Hours of Sleep: 6  Vegetative Symptoms: None  ADLScreening [facility] Assessment Services) Patient's cognitive ability adequate to safely complete daily activities?: Yes Patient able to express need for assistance with ADLs?: Yes Independently performs ADLs?: Yes (appropriate for developmental age)  Abuse/Neglect [facility]) Physical Abuse: Denies Verbal Abuse: Denies Sexual Abuse: Denies  Prior Inpatient Therapy Prior Inpatient Therapy: Yes Prior Therapy Dates: 2004, 2008, 2010, 2011, 2012 Prior Therapy Facilty/Provider(s): [facility], [facility] , [facility], [place], [facility] Reason for Treatment: SI/depression/SA  Prior Outpatient Therapy Prior Outpatient Therapy: Yes Prior Therapy Dates: 2004, 2012 - Current Prior Therapy Facilty/Provider(s): [employer] [place] Reason for Treatment: SA, med mgnt  ADL Screening (condition at time of admission) Patient's cognitive ability adequate to safely complete daily activities?: Yes Patient able to express need for assistance with ADLs?: Yes Independently performs ADLs?: Yes (appropriate for developmental age) Weakness of Legs: None Weakness of Arms/Hands: None  Home Assistive Devices/Equipment Home Assistive Devices/Equipment: None    Abuse/Neglect Assessment (Assessment to be complete while patient is alone) Physical Abuse: Denies Verbal Abuse: Denies Sexual Abuse: Denies Exploitation of patient/patient's resources: Denies Self-Neglect: Denies Values / Beliefs Cultural Requests During Hospitalization: None Spiritual Requests During Hospitalization: None Consults Spiritual Care Consult Needed: No Social Work Consult Needed: No Merchant navy officer (For Healthcare) Advance Directive: Patient does not have advance directive;Patient would not like information    Additional Information 1:1 In Past 12 Months?: No CIRT Risk: No Elopement Risk: No Does patient have  medical clearance?: Yes     Disposition:  Disposition Disposition of Patient: Referred to;Inpatient treatment program Type of inpatient treatment program: Adult Patient referred  to:  (Pending bed availability at [facility].)  On Site Evaluation by:   Reviewed with Physician:     Beatriz Stallion Ray 03/10/2012 4:06 AM

## 2012-03-11 ENCOUNTER — Encounter (HOSPITAL_COMMUNITY): Payer: Self-pay | Admitting: Internal Medicine

## 2012-03-11 DIAGNOSIS — E119 Type 2 diabetes mellitus without complications: Secondary | ICD-10-CM

## 2012-03-11 DIAGNOSIS — I10 Essential (primary) hypertension: Secondary | ICD-10-CM

## 2012-03-11 DIAGNOSIS — F1410 Cocaine abuse, uncomplicated: Secondary | ICD-10-CM

## 2012-03-11 DIAGNOSIS — F319 Bipolar disorder, unspecified: Secondary | ICD-10-CM

## 2012-03-11 DIAGNOSIS — F259 Schizoaffective disorder, unspecified: Principal | ICD-10-CM

## 2012-03-11 DIAGNOSIS — F1210 Cannabis abuse, uncomplicated: Secondary | ICD-10-CM

## 2012-03-11 HISTORY — DX: Type 2 diabetes mellitus without complications: E11.9

## 2012-03-11 HISTORY — DX: Bipolar disorder, unspecified: F31.9

## 2012-03-11 LAB — URINALYSIS, ROUTINE W REFLEX MICROSCOPIC
Leukocytes, UA: NEGATIVE
Nitrite: NEGATIVE
Protein, ur: NEGATIVE mg/dL
Specific Gravity, Urine: 1.036 — ABNORMAL HIGH (ref 1.005–1.030)
Urobilinogen, UA: 0.2 mg/dL (ref 0.0–1.0)

## 2012-03-11 LAB — BASIC METABOLIC PANEL
Calcium: 10 mg/dL (ref 8.4–10.5)
GFR calc Af Amer: >90 mL/min (ref >90)
GFR calc non Af Amer: >90 mL/min (ref >90)
Glucose, Bld: 257 mg/dL — ABNORMAL HIGH (ref 70–99)
Potassium: 4.2 mEq/L (ref 3.5–5.1)
Sodium: 130 mEq/L — ABNORMAL LOW (ref 135–145)

## 2012-03-11 LAB — URINE MICROSCOPIC-ADD ON

## 2012-03-11 LAB — GLUCOSE, CAPILLARY
Glucose-Capillary: 212 mg/dL — ABNORMAL HIGH (ref 70–99)
Glucose-Capillary: 267 mg/dL — ABNORMAL HIGH (ref 70–99)
Glucose-Capillary: 330 mg/dL — ABNORMAL HIGH (ref 70–99)
Glucose-Capillary: 317 mg/dL — ABNORMAL HIGH (ref 70–99)

## 2012-03-11 MED ORDER — BENZTROPINE MESYLATE 1 MG PO TABS
1.0000 mg | ORAL_TABLET | Freq: Every day | ORAL | Status: DC
  Administered 2012-03-12 – 2012-03-16 (×5): 1 mg via ORAL
  Filled 2012-03-11 (×4): qty 1
  Filled 2012-03-11: qty 14
  Filled 2012-03-11 (×2): qty 1

## 2012-03-11 MED ORDER — METFORMIN HCL 500 MG PO TABS
1500.0000 mg | ORAL_TABLET | Freq: Two times a day (BID) | ORAL | Status: DC
  Administered 2012-03-12 – 2012-03-16 (×10): 1500 mg via ORAL
  Filled 2012-03-11: qty 3
  Filled 2012-03-11: qty 84
  Filled 2012-03-11 (×10): qty 3
  Filled 2012-03-11: qty 84
  Filled 2012-03-11 (×2): qty 3

## 2012-03-11 MED ORDER — INSULIN ASPART 100 UNIT/ML ~~LOC~~ SOLN
0.0000 [IU] | Freq: Three times a day (TID) | SUBCUTANEOUS | Status: DC
  Administered 2012-03-11: 11 [IU] via SUBCUTANEOUS
  Administered 2012-03-12: 5 [IU] via SUBCUTANEOUS
  Administered 2012-03-12: 3 [IU] via SUBCUTANEOUS
  Administered 2012-03-12: 5 [IU] via SUBCUTANEOUS

## 2012-03-11 MED ORDER — FLUOXETINE HCL 20 MG PO CAPS
60.0000 mg | ORAL_CAPSULE | Freq: Every day | ORAL | Status: DC
  Administered 2012-03-11: 60 mg via ORAL
  Filled 2012-03-11 (×3): qty 3

## 2012-03-11 MED ORDER — INSULIN GLARGINE 100 UNIT/ML ~~LOC~~ SOLN
22.0000 [IU] | Freq: Every day | SUBCUTANEOUS | Status: DC
  Administered 2012-03-11 – 2012-03-16 (×6): 22 [IU] via SUBCUTANEOUS
  Filled 2012-03-11: qty 0.22

## 2012-03-11 MED ORDER — INSULIN ASPART 100 UNIT/ML ~~LOC~~ SOLN
0.0000 [IU] | Freq: Three times a day (TID) | SUBCUTANEOUS | Status: DC
  Administered 2012-03-11: 8 [IU] via SUBCUTANEOUS
  Administered 2012-03-11: 5 [IU] via SUBCUTANEOUS

## 2012-03-11 MED ORDER — RISPERIDONE 1 MG PO TABS
1.0000 mg | ORAL_TABLET | Freq: Every day | ORAL | Status: DC
  Administered 2012-03-11 – 2012-03-16 (×6): 1 mg via ORAL
  Filled 2012-03-11 (×2): qty 1
  Filled 2012-03-11: qty 14
  Filled 2012-03-11 (×6): qty 1

## 2012-03-11 MED ORDER — FLUOXETINE HCL 20 MG PO CAPS
40.0000 mg | ORAL_CAPSULE | Freq: Every day | ORAL | Status: DC
  Administered 2012-03-12 – 2012-03-16 (×5): 40 mg via ORAL
  Filled 2012-03-11: qty 28
  Filled 2012-03-11 (×6): qty 2

## 2012-03-11 NOTE — [facility]HH Suicide Risk Assessment (Signed)
Suicide Risk Assessment  Admission Assessment     Demographic factors:  Assessment Details Time of Assessment: Admission Information Obtained From: Patient Current Mental Status:  Current Mental Status:  (denies) Loss Factors:  Loss Factors: Loss of significant relationship;Decline in physical health Historical Factors:  Historical Factors: Prior suicide attempts Risk Reduction Factors:  Risk Reduction Factors: Living with another person, especially a relative  CLINICAL FACTORS:   Alcohol/Substance Abuse/Dependencies More than one psychiatric diagnosis Previous Psychiatric Diagnoses and Treatments Medical Diagnoses and Treatments/Surgeries  COGNITIVE FEATURES THAT CONTRIBUTE TO RISK:  Note Noted.  Diagnosis:  Axis I:  Schizoaffective Disorder - Depressive Type.  Cannabis Abuse.  Cocaine Abuse.  The patient was seen today and reports the following:   ADL's: Intact.  Sleep: The patient reports to having difficulty initiating and maintaining sleep last night. Appetite: The patient reports a decreased appetite.   Mild>(1-10) >Severe  Hopelessness (1-10): 10  Depression (1-10): 9  Anxiety (1-10): 9-10   Suicidal Ideation: The patient reports suicidal ideations today but with no plan or intent.  Plan: No  Intent: No  Means: No   Homicidal Ideation: The patient denies any homicidal ideations today.  Plan: No  Intent: No.  Means: No   General Appearance/Behavior: The patient was friendly and cooperative today with this provider.  Eye Contact: Good.  Speech: Appropriate in rate and volume with no pressuring noted today.  Motor Behavior: wnl.  Level of Consciousness: Alert and Oriented x 3.  Mental Status: Alert and Oriented x 3.  Mood: Appears moderately depressed.  Affect: Moderately Constricted.  Anxiety Level: Severe anxiety reported today.  Thought Process: wnl Thought Content: The patient denies any auditory hallucinations today. He reports visual hallucinations  occuring but denies any delusional thinking.  Perception: wnl  Judgment: Fair.  Insight: Fair.  Cognition: Oriented to person, place and time.   Current Medications:    . benztropine  1 mg Oral Daily  . FLUoxetine  40 mg Oral Daily  . insulin aspart  0-15 Units Subcutaneous TID WC  . insulin glargine  20 Units Subcutaneous QHS  . metFORMIN  1,000 mg Oral BID AC  . risperiDONE  1 mg Oral Daily  . traZODone  100 mg Oral QHS  . DISCONTD: FLUoxetine  60 mg Oral Daily  . DISCONTD: insulin aspart  0-15 Units Subcutaneous TID WC   Review of Systems:  Neurological: The patient does report a headache today. He denies any seizures or dizziness.  G.I.: The patient denies any constipation or G.I. Upset today.  Musculoskeletal: The patient denies any muscle or skeletal difficulties.   Time was spent today discussing with the patient his current symptoms. The patient states that he is having difficulty initiating and maintaining sleep as well as reports a decreased appetite.  The patient reports severe feelings of sadness, anhedonia and depressed mood and reports suicidal ideations today with no plan or intent.  He denies any homicidal ideations. He also reports severe anxiety symptoms today. The patient denies any auditory hallucinations or delusional thinking but does report some visual hallucinations today.  The patient denies any symptoms of substance withdrawal.  Treatment Plan Summary:  1. Daily contact with patient to assess and evaluate symptoms and progress in treatment.  2. Medication management  3. The patient will deny suicidal ideations or homicidal ideations for 48 hours prior to discharge and have a depression and anxiety rating of 3 or less. The patient will also deny any auditory or visual hallucinations or delusional  thinking.  4. The patient will deny any symptoms of substance withdrawal at time of discharge.   Plan:  1. Will restart the medication Risperdal at 1 mgs po q am for  visual hallucinations.  2. Will restart the medication Cogentin at 1 mg po q am for EPS.  3. Will continue but decrease the medication Prozac to 40 mgs po q am for depression and anxiety.  4. Will start the medication Trazodone at 100 mgs po qhs for sleep. 5. Laboratory Studies reviewed.  6. Will continue to monitor.   SUICIDE RISK:   Mild:  Suicidal ideation of limited frequency, intensity, duration, and specificity.  There are no identifiable plans, no associated intent, mild dysphoria and related symptoms, good self-control (both objective and subjective assessment), few other risk factors, and identifiable protective factors, including available and accessible social support.   RANDY READLING 03/11/2012, 4:38 PM

## 2012-03-11 NOTE — Tx Team (Signed)
Interdisciplinary Treatment Plan Update (Adult) Date: 03/11/2012  Time Reviewed: 3:05 PM  Progress in Treatment: Attending groups: Yes Participating in groups: Yes Taking medication as prescribed: Yes Tolerating medication: Yes Family/Significant othe contact made: No Patient understands diagnosis: Yes Discussing patient identified problems/goals with staff: Yes Medical problems stabilized or resolved: Yes Denies suicidal/homicidal ideation: Yes Issues/concerns per patient self-inventory: None identified Other: N/A New problem(s) identified: None Identified Reason for Continuation of Hospitalization: Anxiety Depression Hallucinations Medication stabilization Interventions implemented related to continuation of hospitalization: mood stabilization, medication monitoring and adjustment, group therapy and psycho education, safety checks q 15 mins Additional comments: N/A Estimated length of stay:  Discharge Plan: SW is assessing for appropriate referrals.  New goal(s): N/A Review of initial/current patient goals per problem list:  1. Goal(s): Reduce depressive symptoms  Met: No  Target date: by discharge  As evidenced by: Reducing depression from a 10 to a 3 as reported by pt.  2. Goal (s): Eliminate Suicidal Ideation  Met: No  Target date: by discharge  As evidenced by: Eliminate suicidal ideation.  3. Goal(s): Reduce Psychosis  Met: No  Target date: by discharge  As evidenced by: Reduce psychotic symptoms to baseline, as reported by pt.  4. Goal(s):  Address follow-up with Rehab Facility  Met: Yes  Target date: by discharge  As evidenced by:  Pt scheduled for admission to [facility] on Wednesday 8-21.  Pt to arrive between 8am and 9am. Attendees: Patient: [name]  03/11/2012  Family:    Physician: Franchot Gallo, MD 03/11/2012 3:05 PM   Nursing:    Case Manager: Clarice Pole, LCASA 03/11/2012 3:05 PM   Counselor: Veto Kemps, MT-BC 03/11/2012 3:05 PM    Other:  03/11/2012 3:05 PM   Other:    Other:    Other:    Scribe for Treatment Team:  Clarice Pole, LCASA 03/11/2012 3:05 PM

## 2012-03-11 NOTE — Consult Note (Signed)
[facility] Medical Consultation  [name] WUJ:[id number] DOB: 05/04/1961 DOA: 03/10/2012 PCP: Sheila Oats, MD   Requesting physician: Dr Allena Katz Date of consultation: 03/11/12 Reason for consultation: Diabetes management.  Impression/Recommendations Principal Problem:  *Schizoaffective disorder, depressive type Active Problems:  Cocaine abuse  Cannabis abuse  #1 diabetes mellitus2 Patient's had diabetes for 13 years. States his last hemoglobin A1c was 10 range. CBGs noted have ranged from 109- 330. Will check a hemoglobin A1c. Check a UA with cultures and sensitivities. Check a basic metabolic profile. Patient was on 7 units NovoLog meal coverage which has just been started today. Will increase patient's Lantus to 22 units daily and increase Glucophage to 1500 mg twice daily. Continue SSI. We'll monitor for now. Will also consult with diabetic coordinating for further evaluation and management.  #2 hypertension Well-controlled. Monitor.  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  I will followup again tomorrow. Please contact me if I can be of assistance in the meanwhile. Thank you for this consultation.  Chief Complaint:   HPI:  [name] is a 51 yo AAM with history of type 2 diabetes x13 years, schizophrenia, bipolar disorder, hypertension, heart murmur per patient who was admitted to the behavioral health center on 03/08/2012 secondary to recurrent major depressive disorder with suicidal ideations. We will called to consult on patient for management of his diabetes. Patient denies any fever, no chills, no chest pain, no shortness of breath. Patient does endorse headache a few days ago which has since resolved. Patient states that had 3 semi-soft stools which are mushy in nature. Patient does endorse some nausea which has since resolved. Patient denies any abdominal pain. Patient denies any weakness patient denies any dysuria  and no cough.  Review of Systems:  Time point review of systems none as per history of present illness otherwise negative.  Past Medical History  Diagnosis Date  . Diabetes mellitus   . Bipolar affective disorder   . Schizophrenia, schizo-affective   . Hypertension    History reviewed. No pertinent past surgical history. Social History:  reports that he has been smoking Cigarettes.  He does not have any smokeless tobacco history on file. He reports that he drinks alcohol. He reports that he uses illicit drugs (Marijuana).  Allergies  Allergen Reactions  . Wellbutrin (Bupropion) Swelling   History reviewed. No pertinent family history.  Prior to Admission medications   Medication Sig Start Date End Date Taking? Authorizing Provider  FLUoxetine (PROZAC) 20 MG tablet Take 60 mg by mouth daily.   Yes Historical Provider, MD  insulin aspart (NOVOLOG) 100 UNIT/ML injection Inject 7-14 Units into the skin 3 (three) times daily before meals.   Yes Historical Provider, MD  insulin glargine (LANTUS) 100 UNIT/ML injection Inject 20 Units into the skin at bedtime.   Yes Historical Provider, MD  metFORMIN (GLUCOPHAGE) 500 MG tablet Take 1,000 mg by mouth 2 (two) times daily with a meal.   Yes Historical Provider, MD  risperiDONE (RISPERDAL) 1 MG tablet Take 1 mg by mouth daily.   Yes Historical Provider, MD  traZODone (DESYREL) 100 MG tablet Take 100 mg by mouth at bedtime.   Yes Historical Provider, MD   Physical Exam: Blood pressure 126/85, pulse 79, temperature 98 F (36.7 C), temperature source Oral, resp. rate 16, height 5\' 7"  (1.702 m), weight 97.07 kg (214 lb). Filed Vitals:   03/10/12 2214 03/10/12 2216 03/11/12 0907 03/11/12 0908  BP: 122/81 117/73 119/84 126/85  Pulse: 68  83 65 79  Temp: 98.3 F (36.8 C)  98 F (36.7 C)   TempSrc: Oral     Resp: 16  16   Height: 5\' 7"  (1.702 m)     Weight: 97.07 kg (214 lb)        General:  WDWN in NAD.   Eyes: Pupils equal round and  reactive to light and accommodation. Extraocular movements intact.  ENT: Oropharynx is clear, no lesions, no exudates.  Neck: Supple with no lymphadenopathy  Cardiovascular: Regular rate rhythm no murmurs rubs or gallops  Respiratory: Clear to auscultation bilaterally  Abdomen: Soft, nontender, nondistended, positive bowel sounds  Skin: No rashes noted  Musculoskeletal: 5/5 BUE strength, 5/5 BLE strength  Psychiatric: Depressed mood. Flat affect  Neurologic: Alert and oriented x3. Cranial nerves II through XII are grossly intact. No focal deficits.  Labs on Admission:  Basic Metabolic Panel:  Lab 03/08/12 1610  NA 132*  K 4.2  CL 96  CO2 26  GLUCOSE 574*  BUN 14  CREATININE 0.80  CALCIUM 9.1  MG --  PHOS --   Liver Function Tests:  Lab 03/08/12 0845  AST 14  ALT 15  ALKPHOS 75  BILITOT 0.3  PROT 6.9  ALBUMIN 3.2*   No results found for this basename: LIPASE:5,AMYLASE:5 in the last 168 hours No results found for this basename: AMMONIA:5 in the last 168 hours CBC:  Lab 03/08/12 0845  WBC 4.5  NEUTROABS --  HGB 12.5*  HCT 37.0*  MCV 85.5  PLT 187   Cardiac Enzymes: No results found for this basename: CKTOTAL:5,CKMB:5,CKMBINDEX:5,TROPONINI:5 in the last 168 hours BNP: No components found with this basename: POCBNP:5 CBG:  Lab 03/11/12 1712 03/11/12 1149 03/11/12 0614 03/10/12 2306 03/10/12 1717  GLUCAP 330* 267* 212* 191* 109*    Radiological Exams on Admission: No results found.  EKG: None  Time spent: 60 mins  [facility] Pager [phone]  If 7PM-7AM, please contact night-coverage [email or web address] Password [facility] 03/11/2012, 7:05 PM

## 2012-03-11 NOTE — Discharge Planning (Signed)
03/11/2012  SW met with [name] in discharge planning group.  SW found [name] to need contacts made on their behalf to Rehab programs as he has relapsed June 2013 and wants to get clean.  Pt recalled going to [facility] previously and doing well in their program.  Pt. Report last use of cocaine on March 05, 2012 and marijuana about 5-6 days ago.  SW found [name] has history of going to [facility] and [facility] in the past.  SW will continue to assess for referrals.  Met with pt in treatment team.  Pt stated depression and suicidal thoughts led to coming to hospital.  Pt rated depression at 9/10 had a few suicidal thoughts today.  Pt endorsed feeling hopeless at 10/10. Anxiety is 9-10/10.  Pt. stated earlier this morning he was hearing voices.      Clarice Pole, LCASA 03/11/2012, 3:04 PM
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
## 2012-03-11 NOTE — Progress Notes (Signed)
Psychoeducational Group Note  Date:  03/11/2012 Time:  1100  Group Topic/Focus:  Self Esteem Action Plan:   The focus of this group is to help patients create a plan to continue to build self-esteem after discharge.  Participation Level:  Active  Participation Quality:  Appropriate, Sharing and Supportive  Affect:  Appropriate  Cognitive:  Appropriate  Insight:  Good  Engagement in Group:  Good  Additional Comments:  Pt will be more appropriate on the 300 hall.  Isla Pence M 03/11/2012, 12:13 PM

## 2012-03-11 NOTE — Progress Notes (Signed)
D: Patient declined self inventory; Patient admits to SI off and on and denies HI and Auditory hallucinations but admits to some visual hallucinations of seeing shadows or a reflection on the floor; patient reports that his sleep was poor because he could not stay asleep A: offered emotional support; encouraged patient to attend groups; medication administration/physician orders; monitored q 15 minutes; encouraged to express feeling of SI and/or concerns with staff R: Patient is cooperative but depressed and does attend groups; taking medications as prescribed and tolerating medications; interaction with staff and peers is appropriate; set goal to talk to staff 1:1 when having feelings of SI

## 2012-03-11 NOTE — Progress Notes (Signed)
D: pt. Admits to passive  SI especially when he is alone.Pt contracts for safety. Pt. Attended [name]. Affect blunted & mood sad.Pt refused trazedone @[facility]  -as he stated that he sleeps OK.CBG @ HS =317.A: Pt supported & encouraged. Continues on 15 minute checks.pt safety maintained.

## 2012-03-11 NOTE — Tx Team (Signed)
Initial Interdisciplinary Treatment Plan  PATIENT STRENGTHS: (choose at least two) Ability for insight Average or above average intelligence  PATIENT STRESSORS: Health problems Loss of grandmother*   PROBLEM LIST: Problem List/Patient Goals Date to be addressed Date deferred Reason deferred Estimated date of resolution  Psychosis 03/10/12                                                      DISCHARGE CRITERIA:  Ability to meet basic life and health needs Adequate post-discharge living arrangements Improved stabilization in mood, thinking, and/or behavior Medical problems require only outpatient monitoring Motivation to continue treatment in a less acute level of care Need for constant or close observation no longer present Reduction of life-threatening or endangering symptoms to within safe limits Safe-care adequate arrangements made Verbal commitment to aftercare and medication compliance  PRELIMINARY DISCHARGE PLAN: Referrals indicated:  placement  PATIENT/FAMIILY INVOLVEMENT: This treatment plan has been presented to and reviewed with the patient, [name], and/or family member,.  The patient and family have been given the opportunity to ask questions and make suggestions.  [name] 03/11/2012, 12:17 AM

## 2012-03-11 NOTE — Progress Notes (Signed)
Psychoeducational Group Note  Date:  03/11/2012 Time:  2000  Group Topic/Focus:  karaoke  Participation Level:  Active  Participation Quality:  Appropriate  Affect:  Appropriate  Cognitive:  Alert  Insight:  Good  Engagement in Group:  Good  Additional Comments:  Pt attended and enjoyed karaoke this evening.  Kaleen Odea R 03/11/2012, 8:58 PM

## 2012-03-11 NOTE — Progress Notes (Signed)
D: Patient in room and resting with eyes closed on approach.  Writer woke patient up for trazodone and patient.  Interaction minimal due to patient stating he was tired and would like to go back to sleep.  PAtient denies SI/HI and denies AVH. A: Staff to monitor Q 15 mins for safety.  Lantus administered per orders however medication had to be sent over from [facility] pharmacy. Patient offered encouragement and support R: Patient refused trazodone because he states he was already extremely tired tonight. Patient resting and remains safe on the unit.

## 2012-03-11 NOTE — Progress Notes (Signed)
[facility] Group Notes:  (Counselor/Nursing/MHT/Case Management/Adjunct)  03/11/2012 3:19 PM  Type of Therapy:  Music Therapy  Participation Level:  Did Not Attend   Veto Kemps 03/11/2012, 3:19 PM

## 2012-03-12 LAB — GLUCOSE, CAPILLARY: Glucose-Capillary: 246 mg/dL — ABNORMAL HIGH (ref 70–99)

## 2012-03-12 LAB — URINE CULTURE: Culture: NO GROWTH

## 2012-03-12 LAB — T4, FREE: Free T4: 1.16 ng/dL (ref 0.80–1.80)

## 2012-03-12 LAB — HEMOGLOBIN A1C: Hgb A1c MFr Bld: 10.9 % — ABNORMAL HIGH (ref <5.7)

## 2012-03-12 MED ORDER — INSULIN ASPART 100 UNIT/ML ~~LOC~~ SOLN
0.0000 [IU] | Freq: Three times a day (TID) | SUBCUTANEOUS | Status: DC
  Administered 2012-03-13: 11 [IU] via SUBCUTANEOUS
  Administered 2012-03-13: 7 [IU] via SUBCUTANEOUS
  Administered 2012-03-14: 3 [IU] via SUBCUTANEOUS
  Administered 2012-03-14: 7 [IU] via SUBCUTANEOUS
  Administered 2012-03-14: 4 [IU] via SUBCUTANEOUS
  Administered 2012-03-15: 7 [IU] via SUBCUTANEOUS
  Administered 2012-03-15: 4 [IU] via SUBCUTANEOUS
  Administered 2012-03-15: 3 [IU] via SUBCUTANEOUS
  Administered 2012-03-16: 7 [IU] via SUBCUTANEOUS
  Administered 2012-03-16: 4 [IU] via SUBCUTANEOUS
  Administered 2012-03-16: 7 [IU] via SUBCUTANEOUS

## 2012-03-12 MED ORDER — INSULIN ASPART 100 UNIT/ML ~~LOC~~ SOLN
7.0000 [IU] | Freq: Three times a day (TID) | SUBCUTANEOUS | Status: DC
  Administered 2012-03-12 – 2012-03-13 (×5): 7 [IU] via SUBCUTANEOUS
  Administered 2012-03-14 (×2): via SUBCUTANEOUS
  Administered 2012-03-14 – 2012-03-16 (×7): 7 [IU] via SUBCUTANEOUS
  Filled 2012-03-12: qty 10

## 2012-03-12 NOTE — Progress Notes (Signed)
[facility] MD Progress Note  03/12/2012 6:36 PM  Diagnosis:  Axis I: Schizoaffective Disorder - Depressive Type.  Cannabis Abuse.  Cocaine Abuse.   The patient was seen today and reports the following:   ADL's: Intact.  Sleep: The patient reports to having ongoing difficulty initiating and maintaining sleep last night but according to staff he slept 6.25 hours. Appetite: The patient reports a good appetite today.   Mild>(1-10) >Severe  Hopelessness (1-10): 8  Depression (1-10): 9  Anxiety (1-10): 0   Suicidal Ideation: The patient reports suicidal ideations today as "on and off" but with no plan or intent.  Plan: No  Intent: No  Means: No   Homicidal Ideation: The patient denies any homicidal ideations today.  Plan: No  Intent: No.  Means: No   General Appearance/Behavior: The patient was once again friendly and cooperative today with this provider.  Eye Contact: Good.  Speech: Appropriate in rate and volume with no pressuring noted today.  Motor Behavior: wnl.  Level of Consciousness: Alert and Oriented x 3.  Mental Status: Alert and Oriented x 3.  Mood: Appears moderately depressed.  Affect: Moderately constricted.  Anxiety Level: No anxiety reported today.  Thought Process: wnl  Thought Content:  The patient denies any auditory or visual hallucinations today.  He also denies any delusional thinking.  Perception: wnl  Judgment: Fair to Good.  Insight: Fair to Good.  Cognition: Oriented to person, place and time.  Sleep:  Number of Hours: 6.25    Vital Signs:Blood pressure 131/90, pulse 73, temperature 98 F (36.7 C), temperature source Oral, resp. rate 16, height 5\' 7"  (1.702 m), weight 97.07 kg (214 lb).  Current Medications: Current Facility-Administered Medications  Medication Dose Route Frequency Provider Last Rate Last Dose  . acetaminophen (TYLENOL) tablet 650 mg  650 mg Oral Q6H PRN Jorje Guild, PA-C   650 mg at 03/11/12 0646  . alum & mag hydroxide-simeth  (MAALOX/MYLANTA) 200-200-20 MG/5ML suspension 30 mL  30 mL Oral Q4H PRN Jorje Guild, PA-C      . benztropine (COGENTIN) tablet 1 mg  1 mg Oral Daily Curlene Labrum Aarini Slee, MD   1 mg at 03/12/12 0834  . FLUoxetine (PROZAC) capsule 40 mg  40 mg Oral Daily Curlene Labrum Rainey Kahrs, MD   40 mg at 03/12/12 0834  . insulin aspart (novoLOG) injection 0-20 Units  0-20 Units Subcutaneous TID WC Rodolph Bong, MD      . insulin aspart (novoLOG) injection 7 Units  7 Units Subcutaneous TID WC Rodolph Bong, MD   7 Units at 03/12/12 1707  . insulin glargine (LANTUS) injection 22 Units  22 Units Subcutaneous QHS Rodolph Bong, MD   22 Units at 03/11/12 2152  . magnesium hydroxide (MILK OF MAGNESIA) suspension 30 mL  30 mL Oral Daily PRN Jorje Guild, PA-C      . metFORMIN (GLUCOPHAGE) tablet 1,500 mg  1,500 mg Oral BID AC Rodolph Bong, MD   1,500 mg at 03/12/12 1708  . risperiDONE (RISPERDAL) tablet 1 mg  1 mg Oral Daily Curlene Labrum Teondre Jarosz, MD   1 mg at 03/12/12 0834  . traZODone (DESYREL) tablet 100 mg  100 mg Oral QHS Curlene Labrum Moxie Kalil, MD      . DISCONTD: insulin aspart (novoLOG) injection 0-15 Units  0-15 Units Subcutaneous TID WC Ronny Bacon, MD   3 Units at 03/12/12 1707  . DISCONTD: insulin glargine (LANTUS) injection 20 Units  20 Units Subcutaneous QHS Curlene Labrum  Shambhavi Salley, MD   20 Units at 03/11/12 0111  . DISCONTD: metFORMIN (GLUCOPHAGE) tablet 1,000 mg  1,000 mg Oral BID AC Curlene Labrum Idara Woodside, MD   1,000 mg at 03/11/12 1726   Lab Results:  Results for orders placed during the hospital encounter of 03/10/12 (from the past 48 hour(s))  GLUCOSE, CAPILLARY     Status: Abnormal   Collection Time   03/10/12 11:06 PM      Component Value Range Comment   Glucose-Capillary 191 (*) 70 - 99 mg/dL   GLUCOSE, CAPILLARY     Status: Abnormal   Collection Time   03/11/12  6:14 AM      Component Value Range Comment   Glucose-Capillary 212 (*) 70 - 99 mg/dL   GLUCOSE, CAPILLARY     Status: Abnormal   Collection Time     03/11/12 11:49 AM      Component Value Range Comment   Glucose-Capillary 267 (*) 70 - 99 mg/dL   GLUCOSE, CAPILLARY     Status: Abnormal   Collection Time   03/11/12  5:12 PM      Component Value Range Comment   Glucose-Capillary 330 (*) 70 - 99 mg/dL   URINALYSIS, ROUTINE W REFLEX MICROSCOPIC     Status: Abnormal   Collection Time   03/11/12  7:49 PM      Component Value Range Comment   Color, Urine YELLOW  YELLOW    APPearance CLEAR  CLEAR    Specific Gravity, Urine 1.036 (*) 1.005 - 1.030    pH 5.0  5.0 - 8.0    Glucose, UA >1000 (*) NEGATIVE mg/dL    Hgb urine dipstick NEGATIVE  NEGATIVE    Bilirubin Urine NEGATIVE  NEGATIVE    Ketones, ur NEGATIVE  NEGATIVE mg/dL    Protein, ur NEGATIVE  NEGATIVE mg/dL    Urobilinogen, UA 0.2  0.0 - 1.0 mg/dL    Nitrite NEGATIVE  NEGATIVE    Leukocytes, UA NEGATIVE  NEGATIVE   URINE MICROSCOPIC-ADD ON     Status: Abnormal   Collection Time   03/11/12  7:49 PM      Component Value Range Comment   Squamous Epithelial / LPF RARE  RARE    WBC, UA 0-2  <3 WBC/hpf    RBC / HPF 0-2  <3 RBC/hpf    Bacteria, UA FEW (*) RARE    Urine-Other MUCOUS PRESENT     TSH     Status: Normal   Collection Time   03/11/12  8:04 PM      Component Value Range Comment   TSH 1.896  0.350 - 4.500 uIU/mL   T3, FREE     Status: Normal   Collection Time   03/11/12  8:04 PM      Component Value Range Comment   T3, Free 2.7  2.3 - 4.2 pg/mL   T4, FREE     Status: Normal   Collection Time   03/11/12  8:04 PM      Component Value Range Comment   Free T4 1.16  0.80 - 1.80 ng/dL   HEMOGLOBIN Z6X     Status: Abnormal   Collection Time   03/11/12  8:04 PM      Component Value Range Comment   Hemoglobin A1C 10.9 (*) <5.7 %    Mean Plasma Glucose 266 (*) <117 mg/dL   BASIC METABOLIC PANEL     Status: Abnormal   Collection Time   03/11/12  8:04 PM  Component Value Range Comment   Sodium 130 (*) 135 - 145 mEq/L    Potassium 4.2  3.5 - 5.1 mEq/L    Chloride 91 (*)  96 - 112 mEq/L    CO2 28  19 - 32 mEq/L    Glucose, Bld 257 (*) 70 - 99 mg/dL    BUN 12  6 - 23 mg/dL    Creatinine, Ser 1.61  0.50 - 1.35 mg/dL    Calcium 09.6  8.4 - 10.5 mg/dL    GFR calc non Af Amer >90  >90 mL/min    GFR calc Af Amer >90  >90 mL/min   GLUCOSE, CAPILLARY     Status: Abnormal   Collection Time   03/11/12  9:49 PM      Component Value Range Comment   Glucose-Capillary 317 (*) 70 - 99 mg/dL   GLUCOSE, CAPILLARY     Status: Abnormal   Collection Time   03/12/12  6:38 AM      Component Value Range Comment   Glucose-Capillary 246 (*) 70 - 99 mg/dL   GLUCOSE, CAPILLARY     Status: Abnormal   Collection Time   03/12/12 11:56 AM      Component Value Range Comment   Glucose-Capillary 210 (*) 70 - 99 mg/dL   GLUCOSE, CAPILLARY     Status: Abnormal   Collection Time   03/12/12  5:04 PM      Component Value Range Comment   Glucose-Capillary 189 (*) 70 - 99 mg/dL    Comment 1 Notify RN      Physical Findings: AIMS: Facial and Oral Movements Muscles of Facial Expression: None, normal Lips and Perioral Area: None, normal Jaw: None, normal Tongue: None, normal,Extremity Movements Upper (arms, wrists, hands, fingers): None, normal Lower (legs, knees, ankles, toes): None, normal, Trunk Movements Neck, shoulders, hips: None, normal, Overall Severity Severity of abnormal movements (highest score from questions above): None, normal Incapacitation due to abnormal movements: None, normal Patient's awareness of abnormal movements (rate only patient's report): No Awareness, Dental Status Current problems with teeth and/or dentures?: No Does patient usually wear dentures?: No   Review of Systems:  Neurological: The patient does report another headache today. He denies any seizures or dizziness.  G.I.: The patient denies any constipation or G.I. Upset today.  Musculoskeletal: The patient denies any muscle or skeletal difficulties.   Time was spent today discussing with the  patient his current symptoms. The patient states that he is having ongoing difficulty initiating and maintaining sleep but according to staff he slept 6.25 hours last night.  The patient reports a good appetite. The patient reports severe feelings of sadness, anhedonia and depressed mood and reports suicidal ideations today as "on and off" but with no plan or intent. He denies any homicidal ideations or anxiety symptoms today.  The patient denies any current auditory or visual hallucinations or delusional thinking. The patient denies any symptoms of substance withdrawal today.   The patient's blood sugars are under better control today and are being monitored by the Hospitalist Service.  Treatment Plan Summary:  1. Daily contact with patient to assess and evaluate symptoms and progress in treatment.  2. Medication management  3. The patient will deny suicidal ideations or homicidal ideations for 48 hours prior to discharge and have a depression and anxiety rating of 3 or less. The patient will also deny any auditory or visual hallucinations or delusional thinking.  4. The patient will deny any symptoms of substance  withdrawal at time of discharge.   Plan:  1. Will continue the medication Risperdal at 1 mgs po q am for visual hallucinations.  2. Will continue the medication Cogentin at 1 mg po q am for EPS.  3. Will continue the medication Prozac at 40 mgs po q am for depression and anxiety.  4. Will continue the medication Trazodone at 100 mgs po qhs for sleep.  5. Laboratory Studies reviewed.  6. Will continue to monitor.  7. The patient's diabetes will continue to be monitored by the Hospitalist Service.  Paisley Grajeda 03/12/2012, 6:36 PM

## 2012-03-12 NOTE — B[facility] Counselor (Signed)
Adult Comprehensive Assessment  Patient ID: [name], male   DOB: 01/11/1961, 51 y.o.   MRN: [id number]  Information Source: Information source: Patient  Current Stressors:  Educational / Learning stressors: no issues reported Employment / Job issues: unemployed, appeal for disability Family Relationships: limited  contact with family Surveyor, quantity / Lack of resources (include bankruptcy): recently obtained [employer] / Lack of housing: homeless since leaving [place] 3 weeks ago Physical health (include injuries & life threatening diseases): diabetes Social relationships: lacks positive support Substance abuse: relapsed in June, using cocaine when he can get it or afford it. Bereavement / Loss: no issues reported  Living/Environment/Situation:  Living Arrangements: Other (Comment) Living conditions (as described by patient or guardian): living in homeless shelter [place] house How long has patient lived in current situation?: 3 weeks What is atmosphere in current home:  (temporary)  Family History:  Marital status: Divorced Divorced, when?: 1 year ago What types of issues is patient dealing with in the relationship?: n/a Does patient have children?: Yes How many children?: 1  (daughter age 99) How is patient's relationship with their children?: pretty good, has contact about 1-2 times per week  Childhood History:  By whom was/is the patient raised?: Mother (and grandmother) Description of patient's relationship with caregiver when they were a child: good Patient's description of current relationship with people who raised him/her: grandmother-deceased, mother-good Does patient have siblings?: Yes Number of Siblings: 1  (sister) Description of patient's current relationship with siblings: good Did patient suffer any verbal/emotional/physical/sexual abuse as a child?: No Did patient suffer from severe childhood neglect?: No Has patient ever been sexually  abused/assaulted/raped as an adolescent or adult?: No Was the patient ever a victim of a crime or a disaster?: No Witnessed domestic violence?: No Has patient been effected by domestic violence as an adult?: No  Education:  Highest grade of school patient has completed: graduated high school Currently a Consulting civil engineer?: No Learning disability?: No  Employment/Work Situation:   Employment situation: Unemployed Patient's job has been impacted by current illness: No What is the longest time patient has a held a job?: 13 years Where was the patient employed at that time?: [employer] Has patient ever been in the [employer]?: No Has patient ever served in Buyer, retail?: No  Financial Resources:   Financial resources: Medicaid Does patient have a Lawyer or guardian?: No  Alcohol/Substance Abuse:   What has been your use of drugs/alcohol within the last 12 months?: cocaine, relapsed in June, uses when he can get or can afford If attempted suicide, did drugs/alcohol play a role in this?: No Alcohol/Substance Abuse Treatment Hx: Past Tx, Inpatient If yes, describe treatment: [facility]-Inpatient 30 day Has alcohol/substance abuse ever caused legal problems?: No  Social Support System:   Conservation officer, nature Support System: Passenger transport manager Support System: NA, AA,  family Type of faith/religion: none How does patient's faith help to cope with current illness?: n/a  Leisure/Recreation:   Leisure and Hobbies: watch tv  Strengths/Needs:   What things does the patient do well?: caring person In what areas does patient struggle / problems for patient: using drugs  Discharge Plan:   Does patient have access to transportation?: No Plan for no access to transportation at discharge: bus Will patient be returning to same living situation after discharge?: No Plan for living situation after discharge: wants to go to a long term treatment program Currently receiving community mental  health services: No If no, would patient like referral for services  when discharged?: Yes (What county?) Does patient have financial barriers related to discharge medications?: No  Summary/Recommendations:   Summary and Recommendations (to be completed by the evaluator): Patient is a 51 year old African American male with diagnosis of Affective Disorder. Patient was admitted after getting a gun and threatening to shoot himself. He was also hearing command hallucinations to harm other people. Recent stressor is grandmother's death and family conflict. Patient is currently homeless and using cocaine. Patient would benefit from crisis stabilization, medication evaluation , group therapy and psycho-education groups to work on coping skills, and case manager for referrals.  Sami Roes, Aram Beecham. 03/12/2012

## 2012-03-12 NOTE — Progress Notes (Signed)
[facility] PROGRESS NOTE  [name] ZOX:[id number] DOB: 26-Sep-1960 DOA: 03/10/2012 PCP: Sheila Oats, MD  Assessment/Plan: Principal Problem:  *Schizoaffective disorder, depressive type Active Problems:  Cocaine abuse  Cannabis abuse  Diabetes mellitus  HTN (hypertension)  Bipolar disorder  #1 Uncontrolled diabetes mellitus2  Patient's had diabetes for 13 years. States his last hemoglobin A1c was 10 range. CBGs noted have ranged from 189- 317. Hemoglobin A1c =10.9. UA  Unremarkable, no proteinuria. Patient was on 7 units NovoLog meal coverage which has just been started yesterday however was discontinued around midnight for unknown reason. Will resume meal coverage insulin and change SSI to resistant scale. Continue Lantus to 22 units daily and Glucophage to 1500 mg twice daily.Will monitor for now. Diabetic coordinator ff. #2 hypertension  Well-controlled. Monitor.  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  I will followup again tomorrow. Please contact me if I can be of assistance in the meanwhile. Thank you for this consultation.      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 We will called to consult on patient for management of his diabetes. Patient denies any fever, no chills, no chest pain, no shortness of breath. Patient does endorse headache a few days ago which has since resolved. Patient states that had 3 semi-soft stools which are mushy in nature. Patient does endorse some nausea which has since resolved. Patient denies any abdominal pain. Patient denies any weakness patient  denies any dysuria and no cough.   Consultants:  None  Procedures:  None  Antibiotics:  None  HPI/Subjective: No complaints.  Objective: Filed Vitals:   03/11/12 0907 03/11/12 0908 03/12/12 0700 03/12/12 0701  BP: 119/84 126/85 138/85 131/90  Pulse: 65 79 62 73  Temp: 98 F (36.7 C)  98 F (36.7 C) 98 F (36.7 C)  TempSrc:   Oral Oral  Resp: 16  16 16   Height:      Weight:       No intake or output data in the 24 hours ending 03/12/12 1747 Filed Weights   03/10/12 2214  Weight: 97.07 kg (214 lb)    Exam: General: Alert, awake, oriented x3, in no acute distress. HEENT: No bruits, no goiter. Heart: Regular rate and rhythm, without murmurs, rubs, gallops. Lungs: Clear to auscultation bilaterally. Abdomen: Soft, nontender, nondistended, positive bowel sounds. Extremities: No clubbing cyanosis or edema with positive pedal pulses. Neuro: Grossly intact, nonfocal.  Data Reviewed: Basic Metabolic Panel:  Lab 03/11/12 8119 03/08/12 0845  NA 130* 132*  K 4.2 4.2  CL 91* 96  CO2 28 26  GLUCOSE 257* 574*  BUN 12 14  CREATININE 0.86 0.80  CALCIUM 10.0 9.1  MG -- --  PHOS -- --   Liver Function Tests:  Lab 03/08/12 0845  AST 14  ALT 15  ALKPHOS 75  BILITOT 0.3  PROT 6.9  ALBUMIN 3.2*   No results found for this basename: LIPASE:5,AMYLASE:5 in the last 168 hours No results found for this basename: AMMONIA:5 in the last 168 hours CBC:  Lab 03/08/12 0845  WBC 4.5  NEUTROABS --  HGB 12.5*  HCT 37.0*  MCV 85.5  PLT 187   Cardiac Enzymes:  No results found for this basename: CKTOTAL:5,CKMB:5,CKMBINDEX:5,TROPONINI:5 in the last 168 hours BNP (last 3 results) No results found for this basename: PROBNP:3 in the last 8760 hours CBG:  Lab 03/12/12 1704 03/12/12 1156 03/12/12 0638 03/11/12 2149 03/11/12 1712  GLUCAP 189* 210* 246* 317* 330*    No results found for this or any previous visit (from the past 240 hour(s)).   Studies: No results  found.  Scheduled Meds:   . benztropine  1 mg Oral Daily  . FLUoxetine  40 mg Oral Daily  . insulin aspart  0-20 Units Subcutaneous TID WC  . insulin aspart  7 Units Subcutaneous TID WC  . insulin glargine  22 Units Subcutaneous QHS  . metFORMIN  1,500 mg Oral BID AC  . risperiDONE  1 mg Oral Daily  . traZODone  100 mg Oral QHS  . DISCONTD: insulin aspart  0-15 Units Subcutaneous TID WC  . DISCONTD: insulin glargine  20 Units Subcutaneous QHS  . DISCONTD: metFORMIN  1,000 mg Oral BID AC   Continuous Infusions:   Principal Problem:  *Schizoaffective disorder, depressive type Active Problems:  Cocaine abuse  Cannabis abuse  Diabetes mellitus  HTN (hypertension)  Bipolar disorder    Time spent: > 30 mins    [facility]  [facility] Pager [phone]. If 8PM-8AM, please contact night-coverage at [email or web address], password [facility] 03/12/2012, 5:47 PM  LOS: 2 days

## 2012-03-12 NOTE — Progress Notes (Addendum)
[name] is seen OOB UAL on the [address] hall today....tolerated well. He is cooperative and pleasant and articualtes his thoughts and feelings appropriately. He completed his self inventory  This morning and on it he wrote he cont to have " off and on " feelings of SI, but he verbally contracts with this nurse  For safety. He states his depression and hopelessness are rated " 9/10" and that his DC plan is to " take meds".              A He attends his groups and is engaged in his POC. HE requests his insulin meal coverage 7 units be restarted. Dr. Janee Morn was paged and this nurse received order to restart and this was done.      R Safety is in place and POC moves forward with therapeutic relationship fostered PD RN [facility]

## 2012-03-12 NOTE — Progress Notes (Signed)
[facility] Adult Inpatient Family/Significant Other Suicide Prevention Education  Suicide Prevention Education:  Patient Refusal for Family/Significant Other Suicide Prevention Education: The patient [name] has refused to provide written consent for family/significant other to be provided Family/Significant Other Suicide Prevention Education during admission and/or prior to discharge.  Physician notified.  HartisAram Beecham 03/12/2012, 2:43 PM

## 2012-03-12 NOTE — Progress Notes (Signed)
[facility] Group Notes:  (Counselor/Nursing/MHT/Case Management/Adjunct)  03/12/2012 2:23 PM  Type of Therapy:  Group Therapy  Participation Level:  Did Not Attend    [name] 03/12/2012, 2:23 PM

## 2012-03-12 NOTE — Progress Notes (Signed)
Inpatient Diabetes Program Recommendations  AACE/ADA: New Consensus Statement on Inpatient Glycemic Control (2013)  Target Ranges:  Prepandial:   less than 140 mg/dL      Peak postprandial:   less than 180 mg/dL (1-2 hours)      Critically ill patients:  140 - 180 mg/dL   Reason for Visit: Hyperglycemia - Uncontrolled DM  This is a voluntary admission for [name] who presents to the [facility] reporting a 4 day history of increasing symptoms of depression and suicidal ideation. These symptoms have increased over the last week. He does have a history of schizophrenia and bipolar disorder.He also states that he used 40$ worth of cocaine on 8/9, but states he does not drink. He uses THC 2 x a year  Patient's had diabetes for 13 years. States his last hemoglobin A1c was 10 range. CBGs noted have ranged from 109- 330. Will check a hemoglobin A1c. Patient was on 7 units NovoLog meal coverage which has just been started.    Results for [name], [name] (MRN [id number]) as of 03/12/2012 16:54  Ref. Range 03/11/2012 06:14 03/11/2012 11:49 03/11/2012 17:12 03/11/2012 21:49 03/12/2012 06:38 03/12/2012 11:56  Glucose-Capillary Latest Range: 70-99 mg/dL 409 (H) 811 (H) 914 (H) 317 (H) 246 (H) 210 (H)  Results for [name], [name] (MRN [id number]) as of 03/12/2012 16:54  Ref. Range 03/11/2012 20:04  Hemoglobin A1C Latest Range: <5.7 % 10.9 (H)    Inpatient Diabetes Program Recommendations Insulin - Basal: Titrate up Lantus until am CBG is < 180 mg/dL Correction (SSI): Increase correction insulin to Novolog resistant tidwc and hs Insulin - Meal Coverage: Increase meal coverage insulin to 8 units tidwc if pt is eating well  Will continue to follow.

## 2012-03-12 NOTE — Discharge Planning (Signed)
Pt seen in tx team.  Confirmed that he plans to go to [facility] rehab on Tues.  CM plans to contact Cathleen Fears re: [name]'s belongings at [facility]. Possible transfer to [address] hall.

## 2012-03-12 NOTE — Progress Notes (Signed)
Pt laying in bed resting with eyes closed. Respirations even and unlabored. No distress noted.  

## 2012-03-12 NOTE — Progress Notes (Signed)
03/12/2012         Time: 0930      Group Topic/Focus: The focus of the group is on enhancing the patients' ability to cope with stressors by understanding what coping is, why it is important, the negative effects of stress and developing healthier coping skills. Patients practice [place] and discuss how exercise can be used as a healthy coping strategy.  Participation Level: Minimal  Participation Quality: Drowsy  Affect: Blunted  Cognitive: Alert  Additional Comments: Patient slept through group, reporting his medication makes him drowsy. Patient provided with Friday workbook for unit programming.    Tavita Eastham 03/12/2012 11:41 AM

## 2012-03-13 DIAGNOSIS — F319 Bipolar disorder, unspecified: Secondary | ICD-10-CM

## 2012-03-13 LAB — GLUCOSE, CAPILLARY
Glucose-Capillary: 89 mg/dL (ref 70–99)
Glucose-Capillary: 193 mg/dL — ABNORMAL HIGH (ref 70–99)

## 2012-03-13 NOTE — Progress Notes (Signed)
Psychoeducational Group Note  Date:  03/13/2012 Time:  2000  Group Topic/Focus:  Wrap-Up Group:   The focus of this group is to help patients review their daily goal of treatment and discuss progress on daily workbooks.  Participation Level:  Active  Participation Quality:  Appropriate  Affect:  Appropriate  Cognitive:  Appropriate  Insight:  Limited  Engagement in Group:  Limited  Additional Comments:    Terrill Alperin A 03/13/2012, 5:08 AM

## 2012-03-13 NOTE — Progress Notes (Signed)
Psychoeducational Group Note  Date:  03/13/2012 Time:1000am  Group Topic/Focus:  Identifying Needs:   The focus of this group is to help patients identify their personal needs that have been historically problematic and identify healthy behaviors to address their needs.  Participation Level:  Did Not Attend  Participation Quality:  Affect:   Cognitive: Insight: Additional Comments:   Valente David 03/13/2012,11:08 AM

## 2012-03-13 NOTE — Progress Notes (Signed)
[facility] Group Notes:  (Counselor/Nursing/MHT/Case Management/Adjunct)  03/13/2012 6:30 PM  Type of Therapy:  Group Therapy  Participation Level:  Did Not Attend  Summary of Progress/Problems: Pt did not attend group  Berlin Hun, MSW, LCSW 03/13/2012, 6:30 PM

## 2012-03-13 NOTE — Progress Notes (Signed)
Psychoeducational Group Note  Date:  03/13/2012 Time:  2000  Group Topic/Focus:  Wrap-Up Group:   The focus of this group is to help patients review their daily goal of treatment and discuss progress on daily workbooks.  Participation Level:  Active  Participation Quality:  Appropriate  Affect:  Appropriate  Cognitive:  Appropriate  Insight:  Good  Engagement in Group:  Good  Additional Comments:  Patient attended and participated in group tonight. He reported that he had a good day. He attended group and his doctor increased his diabetic medication today to the level he is accustom to. Patient advised that he cope by eating the right thing.   Lita Mains [facility] 03/13/2012, 10:29 PM

## 2012-03-13 NOTE — Progress Notes (Signed)
Patient's labs and CBGs have been reviewed and patient CBGs seem to be stable and improved on current regimen of Glucophage 1500 mg twice daily and Lantus 22 units daily as well as meal coverage NovoLog of 7 units 3 times daily with meals. Continue sliding scale insulin. Nothing further to add at this time. Will sign off. Call with questions. Thank you for including [name] in the treatment of [name].

## 2012-03-13 NOTE — Progress Notes (Signed)
Patient ID: [name], male   DOB: [birth year], 51 y.o.   MRN: [id number] D. The patient was pleasant but with minimal interaction in the milieu. Spent a lot of time isolating in his room. Observed talking to self in his room and in his bathroom. A. Engaged in 1:1 to assess for auditory hallucinations. Encouraged to attend evening group. Reviewed medications. R. The patient denies hearing voices, although he appears distracted and responding to auditory hallucinations. Attended and minimally participated in group. Refused HS Trazodone. Stated he didn't need it. Administered his own Lantus injection.

## 2012-03-13 NOTE — Progress Notes (Signed)
[facility] MD Progress Note                                         03/13/2012    [name] 09-30-1960    [id number] Hospital day #3  1. Schizoaffective disorder, depressive type   2. Cannabis abuse   3. Cocaine abuse   4. Diabetes mellitus   5. Bipolar disorder   6. HTN (hypertension)   The patient was seen today and reports the following:  Sleep: alright Appetite: increasing  Mild>(1-10) >Severe  Depression (1-10): 9 Anxiety (1-10):  9 Hopelessness (1-10): 8  Suicidal Ideation: the patient reports suicidal ideation "off and on." but can contract for safety on the unit. Plan: None Intent: None Means:  None  Homicidal Ideation: the patient denies homicidal ideation. Plan: None Intent: None Means: None  Eye Contact:  good General Appearance: neat Behavior:  Cooperative  Motor Behavior: normal Speech: clear  Mental Status:  Orientation x 3 Level of Consciousness:   alert Mood: depressed Affect: anxious   Thought Process: linear Thought Content: says the AH/VH are decreasing Perception: intact  Judgment: fair Insight: fair Cognition: at least average  VS: height is 5\' 7"  (1.702 m) and weight is 97.07 kg (214 lb). His oral temperature is 97.9 F (36.6 C). His blood pressure is 146/92 and his pulse is 73. His respiration is 18.  Current Medication:   . benztropine  1 mg Oral Daily  . FLUoxetine  40 mg Oral Daily  . insulin aspart  0-20 Units Subcutaneous TID WC  . insulin aspart  7 Units Subcutaneous TID WC  . insulin glargine  22 Units Subcutaneous QHS  . metFORMIN  1,500 mg Oral BID AC  . risperiDONE  1 mg Oral Daily  . traZODone  100 mg Oral QHS    Lab results: Results for orders placed during the hospital encounter of 03/10/12 (from the past 48 hour(s))  GLUCOSE, CAPILLARY     Status: Abnormal   Collection Time   03/11/12  9:49 PM      Component Value Range Comment   Glucose-Capillary 317 (*) 70 - 99 mg/dL   GLUCOSE, CAPILLARY     Status:  Abnormal   Collection Time   03/12/12  6:38 AM      Component Value Range Comment   Glucose-Capillary 246 (*) 70 - 99 mg/dL   GLUCOSE, CAPILLARY     Status: Abnormal   Collection Time   03/12/12 11:56 AM      Component Value Range Comment   Glucose-Capillary 210 (*) 70 - 99 mg/dL   GLUCOSE, CAPILLARY     Status: Abnormal   Collection Time   03/12/12  5:04 PM      Component Value Range Comment   Glucose-Capillary 189 (*) 70 - 99 mg/dL    Comment 1 Notify RN     GLUCOSE, CAPILLARY     Status: Abnormal   Collection Time   03/12/12  8:32 PM      Component Value Range Comment   Glucose-Capillary 186 (*) 70 - 99 mg/dL   GLUCOSE, CAPILLARY     Status: Abnormal   Collection Time   03/13/12  6:42 AM      Component Value Range Comment   Glucose-Capillary 210 (*) 70 - 99 mg/dL    Comment 1 Notify RN     GLUCOSE, CAPILLARY  Status: Normal   Collection Time   03/13/12 11:47 AM      Component Value Range Comment   Glucose-Capillary 89  70 - 99 mg/dL    Comment 1 Notify RN     GLUCOSE, CAPILLARY     Status: Abnormal   Collection Time   03/13/12  5:05 PM      Component Value Range Comment   Glucose-Capillary 288 (*) 70 - 99 mg/dL    Comment 1 Notify RN     GLUCOSE, CAPILLARY     Status: Abnormal   Collection Time   03/13/12  8:53 PM      Component Value Range Comment   Glucose-Capillary 193 (*) 70 - 99 mg/dL    Comment 1 Notify RN       No results found for this or any previous visit for  Last 48 hours.   ROS:    Constitutional: WDWN Adult in NAD   GI: Negative for N,V,D,C   Neuro: Negative for dizziness, blurred vision, visual changes, headaches   Resp: Negative for wheezing, SOB, cough   Cardio: Negative for CP, diaphoresis, fatigue   MSK: Negative for joint pain, swelling, DROM, or ambulatory difficulties.  Time was spent with the patient discussing the current symptoms and the response to treatment. Micheil states that he heard another patient on the [address] talking about a  residential treatment facility in [place] that will take MCD/MCR.  He is interested in going there. Treatment Plan Summary:  1. Daily contact with patient to assess and evaluate symptoms and progress in treatment.  2. Medication management  3. The patient will deny suicidal ideations or homicidal ideations for 48 hours prior to discharge and have a depression and anxiety rating of 3 or less. The patient will also deny any auditory or visual hallucinations or delusional thinking.  4. The patient will deny any symptoms of substance withdrawal at time of discharge.  Plan:  1. Will continue the medication Risperdal at 1 mgs po q am for visual hallucinations.  2. Will continue the medication Cogentin at 1 mg po q am for EPS.  3. Will continue the medication Prozac at 40 mgs po q am for depression and anxiety.  4. Will continue the medication Trazodone at 100 mgs po qhs for sleep.  5. Laboratory Studies reviewed.  6. Will continue to monitor.  7. The patient's diabetes will continue to be monitored by the Hospitalist Service.  8. He is instructed to speak with the CM about this in the AM to discuss his options upon discharge. Rona Ravens. Sony Schlarb [facility] 03/13/2012

## 2012-03-13 NOTE — Progress Notes (Addendum)
Patient ID: [name], male   DOB: [birth year], 51 y.o.   MRN: [id number] 03-13-12 @ 1206pm: nursing shift note:  D: rn was talking with the pt about medications, particularly his insulin and diabetes. A: had pt review his medications so rn could assess his knowledge. R: pt was very familiar with his medications and his insulin. rn will continue to educate and monitor pt. He remains safe.

## 2012-03-14 LAB — GLUCOSE, CAPILLARY
Glucose-Capillary: 146 mg/dL — ABNORMAL HIGH (ref 70–99)
Glucose-Capillary: 140 mg/dL — ABNORMAL HIGH (ref 70–99)

## 2012-03-14 NOTE — Progress Notes (Signed)
Patient ID: [name], male   DOB: 09-12-1960, 51 y.o.   MRN: [id number] D. The patient spent more time out of his room this evening interacting in the milieu. Stated he went to several groups today and was feeling better. He is trying to concentrated on taking better care of himself and his diabetes. Denied any auditory hallucinations. A. Reviewed medications and diabetic diet with patient. R. Pt. Stated he is refusing his Trazodone because he feels the medication increases his blood sugar at night. Reports he slept well without the trazodone.

## 2012-03-14 NOTE — Progress Notes (Signed)
Patient ID: [name], male   DOB: [birth year], 51 y.o.   MRN: [id number] 03-14-12 nursing shift note: D pt wrote on his inventory sheet he was having some passive si. A: rn discussed this with the pt and he stated the passive si was remote. R he did verbally contract and rn advised him if he needed to talk or discuss any issues to come to the rn. Will monitor pt and pt remains safe.

## 2012-03-14 NOTE — Progress Notes (Signed)
Psychoeducational Group Note  Date:  03/14/2012 Time:  1000  Group Topic/Focus:  Spirituality:   The focus of this group is to discuss how one's spirituality can aide in recovery.  Participation Level:  Active  Participation Quality:  Appropriate and Attentive  Affect:  Appropriate  Cognitive:  Alert, Appropriate and Oriented  Insight:  Limited  Engagement in Group:  Limited  Additional Comments:  Patient participated in group but seemed to focus on only one aspect of the topic (talked repeatedly about "his sins"). Patient wasn't redirectable and didn't have much insight as to how his spirituality could help him but he remained appropriate and attentive throughout the group.  Para March, Yashas Camilli MCCOLLUM 03/14/2012, 11:07 AM

## 2012-03-14 NOTE — Progress Notes (Signed)
[facility] MD Progress Note                                         03/14/2012    [name] 06-15-1961    [id number] Hospital day #4  1. Schizoaffective disorder, depressive type   2. Cannabis abuse   3. Cocaine abuse   4. Diabetes mellitus   5. Bipolar disorder   6. HTN (hypertension)   The patient was seen today and reports the following:  Sleep: pretty alright  Appetite: improving  Mild (1-10) Severe  Depression (1-10): 8 Anxiety (1-10): 7 Hopelessness (1-10): 8  Suicidal Ideation: The patient denies suicidal ideation. Plan: None Intent: None Means: None  Homicidal Ideation: The patient denies homicidal ideation. Plan: None Intent: None Means: None  Eye Contact:  good General Appearance: casual Behavior:  Cooperative  Motor Behavior: normal Speech: clear and coherent  Mental Status:  Orientation x 3 Level of Consciousness:   alert Mood: depressed Affect: congruent   Thought Process: linear Thought Content: denies AH/VJ Perception: intact  Judgment: fair Insight: fair Cognition: average  VS: height is 5\' 7"  (1.702 m) and weight is 97.07 kg (214 lb). His oral temperature is 98.4 F (36.9 C). His blood pressure is 129/95 and his pulse is 84. His respiration is 18.  Current Medication:  . benztropine  1 mg Oral Daily  . FLUoxetine  40 mg Oral Daily  . insulin aspart  0-20 Units Subcutaneous TID WC  . insulin aspart  7 Units Subcutaneous TID WC  . insulin glargine  22 Units Subcutaneous QHS  . metFORMIN  1,500 mg Oral BID AC  . risperiDONE  1 mg Oral Daily  . traZODone  100 mg Oral QHS    Lab results:  Results for orders placed during the hospital encounter of 03/10/12 (from the past 48 hour(s))  GLUCOSE, CAPILLARY     Status: Abnormal   Collection Time   03/12/12  5:04 PM      Component Value Range Comment   Glucose-Capillary 189 (*) 70 - 99 mg/dL    Comment 1 Notify RN     GLUCOSE, CAPILLARY     Status: Abnormal   Collection Time   03/12/12   8:32 PM      Component Value Range Comment   Glucose-Capillary 186 (*) 70 - 99 mg/dL   GLUCOSE, CAPILLARY     Status: Abnormal   Collection Time   03/13/12  6:42 AM      Component Value Range Comment   Glucose-Capillary 210 (*) 70 - 99 mg/dL    Comment 1 Notify RN     GLUCOSE, CAPILLARY     Status: Normal   Collection Time   03/13/12 11:47 AM      Component Value Range Comment   Glucose-Capillary 89  70 - 99 mg/dL    Comment 1 Notify RN     GLUCOSE, CAPILLARY     Status: Abnormal   Collection Time   03/13/12  5:05 PM      Component Value Range Comment   Glucose-Capillary 288 (*) 70 - 99 mg/dL    Comment 1 Notify RN     GLUCOSE, CAPILLARY     Status: Abnormal   Collection Time   03/13/12  8:53 PM      Component Value Range Comment   Glucose-Capillary 193 (*) 70 - 99 mg/dL  Comment 1 Notify RN     GLUCOSE, CAPILLARY     Status: Abnormal   Collection Time   03/14/12  6:32 AM      Component Value Range Comment   Glucose-Capillary 186 (*) 70 - 99 mg/dL    Comment 1 Notify RN     GLUCOSE, CAPILLARY     Status: Abnormal   Collection Time   03/14/12 11:48 AM      Component Value Range Comment   Glucose-Capillary 146 (*) 70 - 99 mg/dL      No results found for this or any previous visit for  Last 48 hours.   ROS:    Constitutional: WDWN Adult in NAD   GI: Negative for N,V,D,C   Neuro: Negative for dizziness, blurred vision, visual changes, headaches   Resp: Negative for wheezing, SOB, cough   Cardio: Negative for CP, diaphoresis, fatigue   MSK: Negative for joint pain, swelling, DROM, or ambulatory difficulties.  Time was spent with the patient discussing the current symptoms and the response to treatment.   Treatment Summary:  Treatment Plan: 1. Continue current plan of care with no changes at this time.  Rona Ravens. Ninah Moccio [facility] 03/14/2012

## 2012-03-14 NOTE — Progress Notes (Signed)
Patient ID: [name], male   DOB: Nov 18, 1960, 51 y.o.   MRN: [id number]   [facility] Group Notes:  (Counselor/Nursing/MHT/Case Management/Adjunct)  03/14/2012 11 AM  Type of Therapy:  Aftercare Planning, Group Therapy, Dance/Movement Therapy   Participation Level:  Active  Participation Quality:  Appropriate  Affect:  Appropriate  Cognitive:  Appropriate  Insight:  Good  Engagement in Group:  Good  Engagement in Therapy:  Good  Modes of Intervention:  Clarification, Problem-solving, Role-play, Socialization and Support  Summary of Progress/Problems: After Care: Pt. attended and participated in aftercare planning group. Pt. accepted information on suicide prevention, warning signs to look for with suicide and crisis line numbers to use. The pt. agreed to call crisis line numbers if having warning signs or having thoughts of suicide. Therapist asked group members to share current mood based on a weather forecast and pt shared that he was feeling "dreary." Pt will attend [facility] on Wednesday but wanted a more open treatment center. Therapist processed options with pt and he concluded that [facility] was the best decision because his current residence is not safe.  Counseling:  Therapist and group members discussed healthy support systems, different types of support, and how to ask for help. Therapists modeled healthy and unhealthy supports and group members discussed the differences.   Cassidi Long 03/14/2012. 11:44 AM

## 2012-03-14 NOTE — Progress Notes (Signed)
Psychoeducational Group Note  Date:  03/14/2012 Time:  2000  Group Topic/Focus:  Goals Group:   The focus of this group is to help patients establish daily goals to achieve during treatment and discuss how the patient can incorporate goal setting into their daily lives to aide in recovery.  Participation Level:  Did Not Attend  Participation Quality:  Appropriate  Affect:  Appropriate  Cognitive:  Appropriate  Insight:  None  Engagement in Group:  None  Additional Comments:  Patient did not attend group and remained in bed. It was later learned that patient had complaints of a headache.  Lyndee Hensen 03/14/2012, 9:03 PM

## 2012-03-15 LAB — GLUCOSE, CAPILLARY: Glucose-Capillary: 159 mg/dL — ABNORMAL HIGH (ref 70–99)

## 2012-03-15 NOTE — Progress Notes (Signed)
Patient ID: [name], male   DOB: 10/29/1960, 51 y.o.   MRN: [id number] D. The patient remained in bed the early part of the evening and did not attend evening wrap up group. Denied any auditory hallucinations. Is a bit guarded and looks preoccupied. May be responding to internal stimuli.  A. Q 15 minute safety checks maintained. CBG and insulin administration reviewed with patient. R. Skilled at giving himself insulin injection. Knowledgeable regarding his diabetic care.

## 2012-03-15 NOTE — Progress Notes (Signed)
Patient is less isolative; sitting in day room.  He has some interaction with others; minimal interaction with staff.  Patient rates his depression as a 5 today; he denies any SI/HI/AVH today.  He attends some groups on the 400 hall, however, he has been encouraged to attend groups on the 300 hall for substance abuse counseling.  He has been accepted at [facility] and will need to be discharged Wednesday by 0600 to meet their van by 0800.  His discharge instructions will be completed Tuesday evening for an early morning departure on Wednesday.  Patient has continues to improve with his treatment here.  Continue to monitor medication management and MD orders.  Collaborate with treatment team members and POC regarding patient.  Safety checks continued every 15 minutes.  Encourage patient to attend groups on the 300 hall.  Reassure and support patient.  He has been pleasant and calm; his behavior has been appropriate.

## 2012-03-15 NOTE — Progress Notes (Signed)
03/15/2012         Time: 0930      Group Topic/Focus: The focus of this group is on promoting emotional and psychological well-being through the process of creative expression, relaxation, socialization, fun and enjoyment.  Participation Level: Active  Participation Quality: Attentive  Affect: Blunted  Cognitive: Alert   Additional Comments: Patient talked about depression and anxiety being a big part of his life, but reports he wouldn't have his life be any other way because it has made him who he is.    Eyvonne Burchfield 03/15/2012 10:09 AM

## 2012-03-15 NOTE — Discharge Planning (Signed)
03/15/2012  SW met with [name] in discharge planning group.  SW found Lekendrick Alpern to have adequate transportation upon discharge via Bus to [facility].  SW found Rykar Lebleu to need contacts made on their behalf to [facility] regarding how to obtain his clothing.  Cathleen Fears [phone], agreed to pick clothing up for pt. From [facility].  SW found Romuald Mccaslin to rate his depression at 5/10 and anxiety at 0/10 today.  Pt expressed no SI, HI, or hopelessness.  Pt. Requested concurrent aftercare plan and expressed interest in [facility] in [place], [place].  SW made referral to [facility] after confirming bedspace available.  SW will continue to assess for referrals.  Clarice Pole, LCASA 03/15/2012, 10:00 AM

## 2012-03-15 NOTE — Progress Notes (Signed)
D: At med window, asking for HS meds on approach. Appears blunted and depressed. Calm, and cooperative with assessment. No acute distress noted. States he has had a good day. Feels like his mood is improved and he denies any AVH for a day now. States he has been attending group and finds them beneficial. Refused Trazodone. Offered no questions or concerns. Denies Si/HI/VH and contracts for safety.  A: Safety has been maintained with Q15 minute observation. Support and encouragement provided. POC and medications for the shift reviewed and understanding verbalized. Meds given per MD order.   R: Pt remains safe. He is blunted and depressedt on approach. He is compliant with meds and treatment goals. He offers no questions or concerns. Will continue Q15 min observation and continue current POC.

## 2012-03-15 NOTE — Progress Notes (Signed)
[facility] MD Progress Note                                         03/15/2012    [name] 12-10-1960    [id number] Hospital day #5  1. Schizoaffective disorder, depressive type   2. Cannabis abuse   3. Cocaine abuse   4. Diabetes mellitus   5. Bipolar disorder   6. HTN (hypertension)    The patient was seen today and reports the following:  Sleep:  Poor sleep as his roommate is up and down not sleeping.  Appetite: no problem  Mild (1-10) Severe  Depression (1-10):   5        Anxiety (1-10):    0 Hopelessness (1-10): 0   Suicidal Ideation: The patient denies suicidal ideation. Plan: None Intent: None Means: None  Homicidal Ideation: The patient denies homicidal ideation. Plan: None Intent: None Means: None  Eye Contact:  good General Appearance: neat  Behavior:  Cooperative   Motor Behavior: normal Speech:  normal  Mental Status:  Orientation x  3 Level of Consciousness:   alert Mood: euthymic Affect: congruent   Thought Process:  normal Thought Content: denies Perception: intact  Judgment: fair Insight: improving Cognition:  average  VS: height is 5\' 7"  (1.702 m) and weight is 97.07 kg (214 lb). His oral temperature is 98.4 F (36.9 C). His blood pressure is 129/95 and his pulse is 84. His respiration is 18.  Current Medication:  . benztropine  1 mg Oral Daily  . FLUoxetine  40 mg Oral Daily  . insulin aspart  0-20 Units Subcutaneous TID WC  . insulin aspart  7 Units Subcutaneous TID WC  . insulin glargine  22 Units Subcutaneous QHS  . metFORMIN  1,500 mg Oral BID AC  . risperiDONE  1 mg Oral Daily  . traZODone  100 mg Oral QHS    Lab results:  Results for orders placed during the hospital encounter of 03/10/12 (from the past 48 hour(s))  GLUCOSE, CAPILLARY     Status: Abnormal   Collection Time   03/13/12  5:05 PM      Component Value Range Comment   Glucose-Capillary 288 (*) 70 - 99 mg/dL    Comment 1 Notify RN     GLUCOSE, CAPILLARY      Status: Abnormal   Collection Time   03/13/12  8:53 PM      Component Value Range Comment   Glucose-Capillary 193 (*) 70 - 99 mg/dL    Comment 1 Notify RN     GLUCOSE, CAPILLARY     Status: Abnormal   Collection Time   03/14/12  6:32 AM      Component Value Range Comment   Glucose-Capillary 186 (*) 70 - 99 mg/dL    Comment 1 Notify RN     GLUCOSE, CAPILLARY     Status: Abnormal   Collection Time   03/14/12 11:48 AM      Component Value Range Comment   Glucose-Capillary 146 (*) 70 - 99 mg/dL   GLUCOSE, CAPILLARY     Status: Abnormal   Collection Time   03/14/12  4:48 PM      Component Value Range Comment   Glucose-Capillary 235 (*) 70 - 99 mg/dL    Comment 1 Notify RN     GLUCOSE, CAPILLARY     Status: Abnormal   Collection  Time   03/14/12  8:35 PM      Component Value Range Comment   Glucose-Capillary 140 (*) 70 - 99 mg/dL    Comment 1 Notify RN     GLUCOSE, CAPILLARY     Status: Abnormal   Collection Time   03/15/12  6:04 AM      Component Value Range Comment   Glucose-Capillary 152 (*) 70 - 99 mg/dL   GLUCOSE, CAPILLARY     Status: Abnormal   Collection Time   03/15/12 11:48 AM      Component Value Range Comment   Glucose-Capillary 138 (*) 70 - 99 mg/dL    Comment 1 Notify RN        No results found for this or any previous visit for  Last 48 hours.   ROS:    Constitutional: WDWN Adult in NAD   GI: Negative for N,V,D,C   Neuro: Negative for dizziness, blurred vision, visual changes, headaches   Resp: Negative for wheezing, SOB, cough   Cardio: Negative for CP, diaphoresis, fatigue   MSK: Negative for joint pain, swelling, DROM, or ambulatory difficulties.  Time was spent with the patient discussing the current symptoms and the response to treatment. The patient and I discussed his desire to look at going into long term residential rehab in [place].  He feels that getting away from this area may be more conducive for him to have a good recovery.   Treatment  Summary: 1. Admit for crisis management and stabilization. 2. Medication management to reduce current symptoms to base line and improve the     patient's overall level of functioning 3. Treat health problems as indicated. 4. Develop treatment plan to decrease risk of relapse upon discharge and the need for     readmission. 5. Psycho-social education regarding relapse prevention and self care. 6. Health care follow up as needed for medical problems. 7. Restart home medications where appropriate.  Treatment Plan: 1. The patient will discuss with the CM his interest in going into [facility] in [place]. 2. He is scheduled to go to [facility] on Wednesday if [facility] is not an option. 3. Continue current plan of care. 4. Will follow.  Rona Ravens. Tamyra Fojtik [facility] 03/15/2012

## 2012-03-16 LAB — GLUCOSE, CAPILLARY

## 2012-03-16 MED ORDER — METFORMIN HCL 500 MG PO TABS: 1500.0000 mg | ORAL_TABLET | Freq: Two times a day (BID) | ORAL | Status: DC

## 2012-03-16 MED ORDER — INSULIN GLARGINE 100 UNIT/ML ~~LOC~~ SOLN: 22.0000 [IU] | Freq: Every day | SUBCUTANEOUS | Status: DC

## 2012-03-16 MED ORDER — INSULIN ASPART 100 UNIT/ML ~~LOC~~ SOLN: 7.0000 [IU] | Freq: Three times a day (TID) | SUBCUTANEOUS | Status: DC

## 2012-03-16 MED ORDER — RISPERIDONE 1 MG PO TABS: 1.0000 mg | ORAL_TABLET | Freq: Every day | ORAL | Status: DC

## 2012-03-16 MED ORDER — TRAZODONE HCL 100 MG PO TABS: 100.0000 mg | ORAL_TABLET | Freq: Every day | ORAL | Status: DC

## 2012-03-16 MED ORDER — FLUOXETINE HCL 40 MG PO CAPS: 40.0000 mg | ORAL_CAPSULE | Freq: Every day | ORAL | Status: DC

## 2012-03-16 MED ORDER — BENZTROPINE MESYLATE 1 MG PO TABS: 1.0000 mg | ORAL_TABLET | Freq: Every day | ORAL | Status: DC

## 2012-03-16 NOTE — Progress Notes (Signed)
Patient ID: [name], male   DOB: [birth year], 51 y.o.   MRN: [id number] D: Pt.denies lethality and A/V/H's or other problems.  Pt. Signed discharge papers, in anticipation of AM discharge. A: Pt. Is at his baseline and is ready for AM discharge. R: Pt. Continues to be monitored through the night, every 15 minutes.

## 2012-03-16 NOTE — Progress Notes (Signed)
Psychoeducational Group Note  Date:  03/16/2012 Time:  1100  Group Topic/Focus:  Recovery Goals:   The focus of this group is to identify appropriate goals for recovery and establish a plan to achieve them.  Participation Level:  Minimal  Participation Quality:  Inattentive  Affect:  Flat  Cognitive:  Appropriate  Insight:  Limited  Engagement in Group:  Limited  Additional Comments:  Pt attended group but appeared to be tired and sleepy, had to be awaken several times.  Jeury Mcnab E 03/16/2012, 2:52 PM

## 2012-03-16 NOTE — Progress Notes (Signed)
[facility] Group Notes:  (Counselor/Nursing/MHT/Case Management/Adjunct)  03/16/2012 8:00PM  Type of Therapy:  Group Therapy  Participation Level:  Active  Participation Quality:  Appropriate  Affect:  Appropriate  Cognitive:  Alert and Oriented  Insight:  Good  Engagement in Group:  Good  Engagement in Therapy:  Good  Modes of Intervention:  Clarification, Problem-solving and Support  Summary of Progress/Problems: Pt reported that he had a good day on today. Pt stated that he he is happy because he will be leaving in the morning. Pt verbalized that he is going to facility where he can help with his mental health issues and 12 steps. Pt verbalized that he has learned to be responsible for his own actions. Pt verbalized that he has a lot of coping strategies in his tool box, he just needs to sharpen them up.   Gumecindo Hopkin, Randal Buba 03/16/2012, 8:58 PM

## 2012-03-16 NOTE — Discharge Planning (Signed)
03/16/2012  SW met with [name] in discharge planning group.  SW found [name] to have adequate transportation upon discharge via bus to [facility] for inpatient rehab.  SW found [name] to rate his depression at 5/10 and anxiety high as he is uncertain of when his things will arrive from [facility] and he is a little anxious because he is going to a recovery program in the morning.  SW will continue to assess for referrals.  Clarice Pole, LCASA 03/16/2012, 2:39 PM

## 2012-03-16 NOTE — [facility] Suicide Risk Assessment (Signed)
Suicide Risk Assessment  Discharge Assessment     Demographic factors:  Male  Current Mental Status Per Nursing Assessment::   On Admission:   (denies) At Discharge: Time was spent today discussing with the patient his current symptoms. The patient states that he is now sleeping well and reports a good appetite. The patient denies any significant feelings of sadness, anhedonia or depressed mood and adamantly denies any suicidal ideations today.  He denies any homicidal ideations or anxiety symptoms today. The patient denies any current auditory or visual hallucinations or delusional thinking. The patient denies any symptoms of substance withdrawal today.   Time was spend discussing with the patient the plan for him to discharge early in the morning to enter the [facility] facility for further treatment of his substance abuse issues.  The patient agreed with this plan and states he feels ready for discharge.    Current Mental Status Per Physician:  Diagnosis:  Axis I: Schizoaffective Disorder - Depressive Type.  Cannabis Abuse.  Cocaine Abuse.   The patient was seen today and reports the following:   ADL's: Intact.  Sleep: The patient reports to having ongoing difficulty initiating and maintaining sleep last night but according to staff he slept 6.25 hours.  Appetite: The patient reports a good appetite today.   Mild>(1-10) >Severe  Hopelessness (1-10): 0  Depression (1-10): 0  Anxiety (1-10): 0   Suicidal Ideation: The patient adamantly denies any suicidal ideations today.  Plan: No  Intent: No  Means: No   Homicidal Ideation: The patient adamantly denies any homicidal ideations today.  Plan: No  Intent: No.  Means: No   General Appearance/Behavior: The patient was once again friendly and cooperative today with this provider.  Eye Contact: Good.  Speech: Appropriate in rate and volume with no pressuring noted today.  Motor Behavior: wnl.  Level of Consciousness: Alert and  Oriented x 3.  Mental Status: Alert and Oriented x 3.  Mood: Appears moderately depressed.  Affect: Moderately constricted.  Anxiety Level: No anxiety reported today.  Thought Process: wnl  Thought Content: The patient denies any auditory or visual hallucinations today. He also denies any delusional thinking.  Perception: wnl  Judgment: Fair to Good.  Insight: Fair to Good.  Cognition: Oriented to person, place and time.   Loss Factors: Loss of significant relationship;Decline in physical health  Historical Factors: Prior suicide attempts  Long History of Mental Illness and Substance Abuse,  Risk Reduction Factors:   Good Access to [employer],  Good Insight into Illnesses.  Continued Clinical Symptoms:  Alcohol/Substance Abuse/Dependencies More than one psychiatric diagnosis Previous Psychiatric Diagnoses and Treatments Medical Diagnoses and Treatments/Surgeries  Discharge Diagnoses:   AXIS I:   Schizoaffective Disorder - Depressive Type.    Cannabis Abuse.    Cocaine Abuse.  AXIS II:   Deferred. AXIS III:   1.  Diabetes Mellitus. AXIS IV:   Chronic Mental Illness.  Long History of Substance Abuse.  Limited Primary Support.  Homelessness.  Corporate treasurer.  AXIS V:   GAF at time of admission approximately 35.  GAF at time of discharge approximately 55.  Cognitive Features That Contribute To Risk:  None Noted.    Physical Findings:  AIMS: Facial and Oral Movements  Muscles of Facial Expression: None, normal  Lips and Perioral Area: None, normal  Jaw: None, normal  Tongue: None, normal,Extremity Movements  Upper (arms, wrists, hands, fingers): None, normal  Lower (legs, knees, ankles, toes): None, normal, Trunk Movements  Neck,  shoulders, hips: None, normal, Overall Severity  Severity of abnormal movements (highest score from questions above): None, normal  Incapacitation due to abnormal movements: None, normal  Patient's awareness of abnormal movements  (rate only patient's report): No Awareness, Dental Status  Current problems with teeth and/or dentures?: No  Does patient usually wear dentures?: No   Current Medications:    . benztropine  1 mg Oral Daily  . FLUoxetine  40 mg Oral Daily  . insulin aspart  0-20 Units Subcutaneous TID WC  . insulin aspart  7 Units Subcutaneous TID WC  . insulin glargine  22 Units Subcutaneous QHS  . metFORMIN  1,500 mg Oral BID AC  . risperiDONE  1 mg Oral Daily  . traZODone  100 mg Oral QHS   Review of Systems:  Neurological: The patient does report another headache today. He denies any seizures or dizziness.  G.I.: The patient denies any constipation or G.I. Upset today.  Musculoskeletal: The patient denies any muscle or skeletal difficulties.   Time was spent today discussing with the patient his current symptoms. The patient states that he is now sleeping well and reports a good appetite. The patient denies any significant feelings of sadness, anhedonia or depressed mood and adamantly denies any suicidal ideations today.  He denies any homicidal ideations or anxiety symptoms today. The patient denies any current auditory or visual hallucinations or delusional thinking. The patient denies any symptoms of substance withdrawal today.   Time was spend discussing with the patient the plan for him to discharge early in the morning to enter the [facility] facility for further treatment of his substance abuse issues.  The patient agreed with this plan and states he feels ready for discharge.  Treatment Plan Summary:  1. Daily contact with patient to assess and evaluate symptoms and progress in treatment.  2. Medication management  3. The patient will deny suicidal ideations or homicidal ideations for 48 hours prior to discharge and have a depression and anxiety rating of 3 or less. The patient will also deny any auditory or visual hallucinations or delusional thinking.  4. The patient will deny any symptoms of  substance withdrawal at time of discharge.   Plan:  1. Will continue the medication Risperdal at 1 mgs po q am for visual hallucinations.  2. Will continue the medication Cogentin at 1 mg po q am for EPS.  3. Will continue the medication Prozac at 40 mgs po q am for depression and anxiety.  4. Will continue the medication Trazodone at 100 mgs po qhs for sleep.  5. Laboratory Studies reviewed.  6. Will continue to monitor.  7. The patient's diabetes will continue to be monitored by the Hospitalist Service. 8. The patient will be discharged in the morning to enter the [facility] for longer term treatment of his substance abuse issues.  Suicide Risk:  Minimal: No identifiable suicidal ideation.  Patients presenting with no risk factors but with morbid ruminations; may be classified as minimal risk based on the severity of the depressive symptoms  Plan Of Care/Follow-up recommendations:  Activity:  As tolerated. Diet:  Diabetic Health Healthy Diet. Other:  Please take all medications only as directed and keep all scheduled follow up appointments.  Please abstain from any use of alcohol or illicit drugs.  Oliviarose Punch 03/16/2012, 4:31 PM

## 2012-03-16 NOTE — Progress Notes (Addendum)
On patient's self inventory sheet, has poor sleep, improving appetite, normal energy level, improving attention span.  Rated depression and hopelessness #5.  Denied withdrawals.  Denied SI.  Has experienced lightheadedness, pain, headaches, blurred vision in past 24 hours.  Pain goal #5, worst pain #5.  After discharge, plans to take meds.  Has placement questions for staff.   Does have discharge plans.  No problems taking meds after discharge. Patient's CBG was 154 at 1200.  Novolog 4 units sliding scale and 7 units meal coverage, total 11 units Novolog patient received at 1200 per MD order.  Patient has attended groups today.  Patient has been cooperative and pleasant. Patient's CBG at 1700 was 245.  Novolog 14 units given to patient per MD order.  Patient has attended groups this afternoon.   Patient has been cooperative and pleasant.

## 2012-03-16 NOTE — Progress Notes (Signed)
Psychoeducational Group Note  Date:  03/16/2012 Time:  2000  Group Topic/Focus:  Wrap-Up Group:   The focus of this group is to help patients review their daily goal of treatment and discuss progress on daily workbooks.  Participation Level:  Active  Participation Quality:  Appropriate  Affect:  Appropriate  Cognitive:  Appropriate  Insight:  Good  Engagement in Group:  Good  Additional Comments:  Patient attended,however, did not participated in group tonight  Scot Dock 03/16/2012, 12:29 AM

## 2012-03-17 NOTE — Progress Notes (Signed)
Pt denies SI/HI/AVH. Pt given discharge instructions, prescriptions, samples, and bus pass (including directions on which bus to take). Pt also given medications that were in his locker. Pt states receipt of all belongings.

## 2012-03-19 NOTE — Progress Notes (Signed)
After Visit Summary (AVS) Faxed to: 03/19/2012 Psychiatric Admission Assessment Note Faxed to: 03/19/2012 Suicide Risk Assessment-Discharge Assessment Faxed to: 03/19/2012  Faxed to: [facility], [phone]  Emilio Aspen, 03/19/2012, 11:52AM

## 2012-04-11 NOTE — Discharge Summary (Signed)
Physician Discharge Summary Note  Patient:  [name] is an 51 y.o., male MRN:  [id number] DOB:  [birth year] Patient phone:  [phone] (home)  Patient address:   [address] [place] [address]   Date of Admission:  03/10/2012 Date of Discharge: 03/17/2012  Discharge Diagnoses: Principal Problem:  *Schizoaffective disorder, depressive type Active Problems:  Cocaine abuse  Cannabis abuse  Diabetes mellitus  HTN (hypertension)  Bipolar disorder  Axis Diagnosis:  AXIS I: Schizoaffective Disorder - Depressive Type.  Cannabis Abuse.  Cocaine Abuse.  AXIS II: Deferred.  AXIS III: 1. Diabetes Mellitus.  AXIS IV: Chronic Mental Illness. Long History of Substance Abuse. Limited Primary Support. Homelessness. Corporate treasurer.  AXIS V: GAF at time of admission approximately 35. GAF at time of discharge approximately 55.   Level of Care:   Inpatient hospitalization.   Reason for Admission:  This is a voluntary admission for [name] who presents to the [facility] reporting a 4 day history of increasing symptoms of depression and suicidal ideation. These symptoms have increased over the last week. He does have a history of schizophrenia and bipolar disorder.He also states that he used 40$ worth of cocaine on 8/9, but states he does not drink. He uses THC 2 x a year.  Hospital Course:   The patient attended treatment team meeting this am and met with treatment team members. The patient's symptoms, treatment plan and response to treatment was discussed. The patient endorsed that their symptoms have improved. The patient also stated that they felt stable for discharge.  They reported that from this hospital stay they had learned many coping skills.  In other to maintain their psychiatric stability, they will continue psychiatric care on an outpatient basis. They will follow-up as outlined below.  In addition they were instructed  to take all your medications as prescribed by their  mental healthcare provider and to report any adverse effects and or reactions from your medicines to their outpatient provider promptly.  The patient is also instructed and cautioned to not engage in alcohol and or illegal drug use while on prescription medicines.  In the event of worsening symptoms the patient is instructed to call the crisis hotline, 911 and or go to the nearest ED for appropriate evaluation and treatment of symptoms.   Also while a patient in this hospital, the patient received medication management for his psychiatric symptoms. They were ordered and received as outlined below:    Medication List     As of 04/11/2012  8:30 PM    STOP taking these medications         FLUoxetine 20 MG tablet   Commonly known as: PROZAC      TAKE these medications      Indication    benztropine 1 MG tablet   Commonly known as: COGENTIN   Take 1 tablet (1 mg total) by mouth daily. For EPS.       FLUoxetine 40 MG capsule   Commonly known as: PROZAC   Take 1 capsule (40 mg total) by mouth daily. For depression and anxiety.       insulin aspart 100 UNIT/ML injection   Commonly known as: novoLOG   Inject 7-14 Units into the skin 3 (three) times daily before meals. For blood sugar control.       insulin glargine 100 UNIT/ML injection   Commonly known as: LANTUS   Inject 22 Units into the skin at bedtime. For blood sugar control.  metFORMIN 500 MG tablet   Commonly known as: GLUCOPHAGE   Take 3 tablets (1,500 mg total) by mouth 2 (two) times daily before a meal. For blood sugar control.       risperiDONE 1 MG tablet   Commonly known as: RISPERDAL   Take 1 tablet (1 mg total) by mouth daily. For psychosis.       traZODone 100 MG tablet   Commonly known as: DESYREL   Take 1 tablet (100 mg total) by mouth at bedtime. For sleep.        They were also enrolled in group counseling sessions and activities in which they participated actively.       Follow-up Information     Follow up with [facility]  on 03/17/2012. (Pt scheduled for admission on 03-17-12 at  8 sharp.)    Contact information:   [address] [place], [place] [address] Phone: [phone] Fax: [phone]         Upon discharge, patient adamantly denies suicidal, homicidal ideations, auditory, visual hallucinations and or delusional thinking. They left [facility] with all personal belongings via personal transportation in no apparent distress.  Consults:  Please see the patient's electronic medical record for more details.   Significant Diagnostic Studies:  Please see the patient's electronic medical record for more details.   Discharge Vitals:   Blood pressure 136/87, pulse 71, temperature 97.9 F (36.6 C), temperature source Oral, resp. rate 20, height 5\' 7"  (1.702 m), weight 97.07 kg (214 lb)..  Mental Status Exam: Demographic factors:  Male  Current Mental Status Per Nursing Assessment::  On Admission: (denies)  At Discharge: Time was spent today discussing with the patient his current symptoms. The patient states that he is now sleeping well and reports a good appetite. The patient denies any significant feelings of sadness, anhedonia or depressed mood and adamantly denies any suicidal ideations today. He denies any homicidal ideations or anxiety symptoms today. The patient denies any current auditory or visual hallucinations or delusional thinking. The patient denies any symptoms of substance withdrawal today.  Time was spend discussing with the patient the plan for him to discharge early in the morning to enter the [facility] facility for further treatment of his substance abuse issues. The patient agreed with this plan and states he feels ready for discharge.  Current Mental Status Per Physician:  Diagnosis:  Axis I: Schizoaffective Disorder - Depressive Type.  Cannabis Abuse.  Cocaine Abuse.   The patient was seen today and reports the following:   ADL's: Intact.  Sleep:  The patient reports to having ongoing difficulty initiating and maintaining sleep last night but according to staff he slept 6.25 hours.  Appetite: The patient reports a good appetite today.  Mild>(1-10) >Severe  Hopelessness (1-10): 0  Depression (1-10): 0  Anxiety (1-10): 0  Suicidal Ideation: The patient adamantly denies any suicidal ideations today.  Plan: No  Intent: No  Means: No  Homicidal Ideation: The patient adamantly denies any homicidal ideations today.  Plan: No  Intent: No.  Means: No  General Appearance/Behavior: The patient was once again friendly and cooperative today with this provider.  Eye Contact: Good.  Speech: Appropriate in rate and volume with no pressuring noted today.  Motor Behavior: wnl.  Level of Consciousness: Alert and Oriented x 3.  Mental Status: Alert and Oriented x 3.  Mood: Appears moderately depressed.  Affect: Moderately constricted.  Anxiety Level: No anxiety reported today.  Thought Process: wnl  Thought Content:  The patient denies any auditory or visual hallucinations today. He also denies any delusional thinking.  Perception: wnl  Judgment: Fair to Good.  Insight: Fair to Good.  Cognition: Oriented to person, place and time.  Loss Factors:  Loss of significant relationship;Decline in physical health  Historical Factors:  Prior suicide attempts Long History of Mental Illness and Substance Abuse,  Risk Reduction Factors:  Good Access to [employer], Good Insight into Illnesses.  Continued Clinical Symptoms:  Alcohol/Substance Abuse/Dependencies  More than one psychiatric diagnosis  Previous Psychiatric Diagnoses and Treatments  Medical Diagnoses and Treatments/Surgeries  Discharge Diagnoses:  AXIS I: Schizoaffective Disorder - Depressive Type.  Cannabis Abuse.  Cocaine Abuse.  AXIS II: Deferred.  AXIS III: 1. Diabetes Mellitus.  AXIS IV: Chronic Mental Illness. Long History of Substance Abuse. Limited Primary Support.  Homelessness. Corporate treasurer.  AXIS V: GAF at time of admission approximately 35. GAF at time of discharge approximately 55.  Cognitive Features That Contribute To Risk:  None Noted.  Physical Findings:  AIMS: Facial and Oral Movements  Muscles of Facial Expression: None, normal  Lips and Perioral Area: None, normal  Jaw: None, normal  Tongue: None, normal,Extremity Movements  Upper (arms, wrists, hands, fingers): None, normal  Lower (legs, knees, ankles, toes): None, normal, Trunk Movements  Neck, shoulders, hips: None, normal, Overall Severity  Severity of abnormal movements (highest score from questions above): None, normal  Incapacitation due to abnormal movements: None, normal  Patient's awareness of abnormal movements (rate only patient's report): No Awareness, Dental Status  Current problems with teeth and/or dentures?: No  Does patient usually wear dentures?: No   Current Medications:   .  benztropine  1 mg  Oral  Daily   .  FLUoxetine  40 mg  Oral  Daily   .  insulin aspart  0-20 Units  Subcutaneous  TID WC   .  insulin aspart  7 Units  Subcutaneous  TID WC   .  insulin glargine  22 Units  Subcutaneous  QHS   .  metFORMIN  1,500 mg  Oral  BID AC   .  risperiDONE  1 mg  Oral  Daily   .  traZODone  100 mg  Oral  QHS    Review of Systems:  Neurological: The patient does report another headache today. He denies any seizures or dizziness.  G.I.: The patient denies any constipation or G.I. Upset today.  Musculoskeletal: The patient denies any muscle or skeletal difficulties.  Time was spent today discussing with the patient his current symptoms. The patient states that he is now sleeping well and reports a good appetite. The patient denies any significant feelings of sadness, anhedonia or depressed mood and adamantly denies any suicidal ideations today. He denies any homicidal ideations or anxiety symptoms today. The patient denies any current auditory or visual  hallucinations or delusional thinking. The patient denies any symptoms of substance withdrawal today.  Time was spend discussing with the patient the plan for him to discharge early in the morning to enter the [facility] facility for further treatment of his substance abuse issues. The patient agreed with this plan and states he feels ready for discharge.   Treatment Plan Summary:  1. Daily contact with patient to assess and evaluate symptoms and progress in treatment.  2. Medication management  3. The patient will deny suicidal ideations or homicidal ideations for 48 hours prior to discharge and have a depression and anxiety rating of 3 or less. The  patient will also deny any auditory or visual hallucinations or delusional thinking.  4. The patient will deny any symptoms of substance withdrawal at time of discharge.   Plan:  1. Will continue the medication Risperdal at 1 mgs po q am for visual hallucinations.  2. Will continue the medication Cogentin at 1 mg po q am for EPS.  3. Will continue the medication Prozac at 40 mgs po q am for depression and anxiety.  4. Will continue the medication Trazodone at 100 mgs po qhs for sleep.  5. Laboratory Studies reviewed.  6. Will continue to monitor.  7. The patient's diabetes will continue to be monitored by the Hospitalist Service.  8. The patient will be discharged in the morning to enter the [facility] for longer term treatment of his substance abuse issues.   Suicide Risk:  Minimal: No identifiable suicidal ideation. Patients presenting with no risk factors but with morbid ruminations; may be classified as minimal risk based on the severity of the depressive symptoms   Plan Of Care/Follow-up recommendations:  Activity: As tolerated.  Diet: Diabetic Health Healthy Diet.  Other: Please take all medications only as directed and keep all scheduled follow up appointments. Please abstain from any use of alcohol or illicit  drugs.  Discharge destination:  Home  Is patient on multiple antipsychotic therapies at discharge:  No  Has Patient had three or more failed trials of antipsychotic monotherapy by history: N/A Recommended Plan for Multiple Antipsychotic Therapies: N/A Discharge Orders    Future Orders Please Complete By Expires   Diet - low sodium heart healthy      Increase activity slowly      Discharge instructions      Comments:   Please take all medications only as directed and keep all scheduled follow up appointments arranged after you leave [facility].  Also abstain from any use of alcohol or illicit drugs.       Medication List     As of 04/11/2012  8:30 PM    STOP taking these medications         FLUoxetine 20 MG tablet   Commonly known as: PROZAC      TAKE these medications      Indication    benztropine 1 MG tablet   Commonly known as: COGENTIN   Take 1 tablet (1 mg total) by mouth daily. For EPS.       FLUoxetine 40 MG capsule   Commonly known as: PROZAC   Take 1 capsule (40 mg total) by mouth daily. For depression and anxiety.       insulin aspart 100 UNIT/ML injection   Commonly known as: novoLOG   Inject 7-14 Units into the skin 3 (three) times daily before meals. For blood sugar control.       insulin glargine 100 UNIT/ML injection   Commonly known as: LANTUS   Inject 22 Units into the skin at bedtime. For blood sugar control.       metFORMIN 500 MG tablet   Commonly known as: GLUCOPHAGE   Take 3 tablets (1,500 mg total) by mouth 2 (two) times daily before a meal. For blood sugar control.       risperiDONE 1 MG tablet   Commonly known as: RISPERDAL   Take 1 tablet (1 mg total) by mouth daily. For psychosis.       traZODone 100 MG tablet   Commonly known as: DESYREL   Take 1 tablet (100 mg total) by  mouth at bedtime. For sleep.            Follow-up Information    Follow up with [facility]  on 03/17/2012. (Pt scheduled for admission on 03-17-12 at  8  sharp.)    Contact information:   [address] [place], [place] [address] Phone: [phone] Fax: [phone]         Follow-up recommendations:   Activities: Resume typical activities Diet: Resume typical diet Other: Follow up with outpatient provider and report any side effects to out patient prescriber.  Comments:  Take all your medications as prescribed by your mental healthcare provider. Report any adverse effects and or reactions from your medicines to your outpatient provider promptly. Patient is instructed and cautioned to not engage in alcohol and or illegal drug use while on prescription medicines. In the event of worsening symptoms, patient is instructed to call the crisis hotline, 911 and or go to the nearest ED for appropriate evaluation and treatment of symptoms.  Signed: Franchot Gallo 04/11/2012 8:30 PM

## 2012-06-11 ENCOUNTER — Encounter (HOSPITAL_COMMUNITY): Payer: Self-pay | Admitting: *Deleted

## 2012-06-11 ENCOUNTER — Emergency Department (HOSPITAL_COMMUNITY)
Admission: EM | Admit: 2012-06-11 | Discharge: 2012-06-14 | Disposition: A | Discharge status: Other Acute Inpatient Hospital | Payer: Medicare Other | Attending: Emergency Medicine | Admitting: Emergency Medicine

## 2012-06-11 DIAGNOSIS — F259 Schizoaffective disorder, unspecified: Secondary | ICD-10-CM | POA: Insufficient documentation

## 2012-06-11 DIAGNOSIS — F3289 Other specified depressive episodes: Secondary | ICD-10-CM | POA: Insufficient documentation

## 2012-06-11 DIAGNOSIS — F319 Bipolar disorder, unspecified: Secondary | ICD-10-CM | POA: Insufficient documentation

## 2012-06-11 DIAGNOSIS — F17200 Nicotine dependence, unspecified, uncomplicated: Secondary | ICD-10-CM | POA: Insufficient documentation

## 2012-06-11 DIAGNOSIS — Z794 Long term (current) use of insulin: Secondary | ICD-10-CM | POA: Insufficient documentation

## 2012-06-11 DIAGNOSIS — F32A Depression, unspecified: Secondary | ICD-10-CM

## 2012-06-11 DIAGNOSIS — I10 Essential (primary) hypertension: Secondary | ICD-10-CM | POA: Insufficient documentation

## 2012-06-11 DIAGNOSIS — F1420 Cocaine dependence, uncomplicated: Secondary | ICD-10-CM | POA: Insufficient documentation

## 2012-06-11 DIAGNOSIS — E119 Type 2 diabetes mellitus without complications: Secondary | ICD-10-CM | POA: Insufficient documentation

## 2012-06-11 DIAGNOSIS — Z79899 Other long term (current) drug therapy: Secondary | ICD-10-CM | POA: Insufficient documentation

## 2012-06-11 DIAGNOSIS — F329 Major depressive disorder, single episode, unspecified: Secondary | ICD-10-CM | POA: Insufficient documentation

## 2012-06-11 DIAGNOSIS — IMO0001 Reserved for inherently not codable concepts without codable children: Secondary | ICD-10-CM | POA: Insufficient documentation

## 2012-06-11 LAB — COMPREHENSIVE METABOLIC PANEL
Albumin: 3.8 g/dL (ref 3.5–5.2)
BUN: 6 mg/dL (ref 6–23)
Creatinine, Ser: 0.85 mg/dL (ref 0.50–1.35)
Total Protein: 7.4 g/dL (ref 6.0–8.3)

## 2012-06-11 LAB — RAPID URINE DRUG SCREEN, HOSP PERFORMED
Benzodiazepines: NOT DETECTED
Cocaine: POSITIVE — AB
Opiates: NOT DETECTED
Tetrahydrocannabinol: NOT DETECTED

## 2012-06-11 LAB — CBC WITH DIFFERENTIAL/PLATELET
Basophils Relative: 0 % (ref 0–1)
Eosinophils Absolute: 0.1 10*3/uL (ref 0.0–0.7)
HCT: 35.4 % — ABNORMAL LOW (ref 39.0–52.0)
Hemoglobin: 12.3 g/dL — ABNORMAL LOW (ref 13.0–17.0)
MCH: 28.9 pg (ref 26.0–34.0)
MCHC: 34.7 g/dL (ref 30.0–36.0)
Monocytes Absolute: 0.6 10*3/uL (ref 0.1–1.0)
Monocytes Relative: 8 % (ref 3–12)
Neutrophils Relative %: 64 % (ref 43–77)
RDW: 12.8 % (ref 11.5–15.5)

## 2012-06-11 LAB — GLUCOSE, CAPILLARY: Glucose-Capillary: 289 mg/dL — ABNORMAL HIGH (ref 70–99)

## 2012-06-11 LAB — ETHANOL: Alcohol, Ethyl (B): <11 mg/dL (ref 0–11)

## 2012-06-11 MED ORDER — ACETAMINOPHEN 325 MG PO TABS
650.0000 mg | ORAL_TABLET | ORAL | Status: DC | PRN
  Administered 2012-06-13: 650 mg via ORAL
  Filled 2012-06-11: qty 2

## 2012-06-11 MED ORDER — METFORMIN HCL 500 MG PO TABS
1500.0000 mg | ORAL_TABLET | Freq: Two times a day (BID) | ORAL | Status: DC
  Administered 2012-06-12 – 2012-06-14 (×5): 1500 mg via ORAL
  Filled 2012-06-11 (×7): qty 3

## 2012-06-11 MED ORDER — IBUPROFEN 600 MG PO TABS: 600.0000 mg | ORAL_TABLET | Freq: Three times a day (TID) | ORAL | Status: DC | PRN

## 2012-06-11 MED ORDER — LORAZEPAM 1 MG PO TABS
1.0000 mg | ORAL_TABLET | Freq: Three times a day (TID) | ORAL | Status: DC | PRN
  Administered 2012-06-14: 1 mg via ORAL
  Filled 2012-06-11: qty 1

## 2012-06-11 MED ORDER — FLUOXETINE HCL 20 MG PO CAPS
40.0000 mg | ORAL_CAPSULE | Freq: Every day | ORAL | Status: DC
  Administered 2012-06-11 – 2012-06-14 (×4): 40 mg via ORAL
  Filled 2012-06-11 (×4): qty 2

## 2012-06-11 MED ORDER — INSULIN GLARGINE 100 UNIT/ML ~~LOC~~ SOLN
22.0000 [IU] | Freq: Once | SUBCUTANEOUS | Status: AC
  Administered 2012-06-11: 22 [IU] via SUBCUTANEOUS
  Filled 2012-06-11: qty 1

## 2012-06-11 NOTE — ED Notes (Signed)
Pt here with c/o being suicidal. States has hx of multiple attempts and "I was going to shoot myself today with a gun, but my cousin took the gun away from me."

## 2012-06-11 NOTE — ED Provider Notes (Signed)
History     CSN: [id number]  Arrival date & time 06/11/12  1622   First MD Initiated Contact with Patient 06/11/12 1752      Chief Complaint  Patient presents with  . Suicidal    (Consider location/radiation/quality/duration/timing/severity/associated sxs/prior treatment) HPI Comments: Patient presents to the emergency department with suicidal ideations. He states she's had a history of depression in the past and has attempted suicide in the past. He states he's been increasingly depressed and suicidal over the last 34 days. He has thoughts of shooting himself with a gun. He denies any physical complaints other then he's had some ongoing pain in his right shoulder which she's had in the past. He denies any injury to his shoulder. 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 He denies any chest pain or shortness of breath. He does have a history of recent cocaine use but denies any other drug use. Denies alcohol use.   Past Medical History  Diagnosis Date  . Diabetes mellitus   . Bipolar affective disorder   . Schizophrenia, schizo-affective   . Hypertension   . Diabetes mellitus 03/11/2012  . Bipolar disorder 03/11/2012    History reviewed. No pertinent past surgical history.  History reviewed. No pertinent family history.  History  Substance Use Topics  . Smoking status: Current Every Day Smoker    Types: Cigarettes  . Smokeless tobacco: Not on file  . Alcohol Use: Yes      Review of Systems  Constitutional: Negative for fever, chills, diaphoresis and fatigue.  HENT: Negative for congestion, rhinorrhea and sneezing.   Eyes: Negative.   Respiratory: Negative for cough, chest tightness and shortness of breath.   Cardiovascular: Negative for chest pain and leg swelling.  Gastrointestinal: Negative for nausea, vomiting, abdominal pain, diarrhea and blood in stool.  Genitourinary: Negative for frequency, hematuria, flank  pain and difficulty urinating.  Musculoskeletal: Positive for myalgias. Negative for back pain and arthralgias.  Skin: Negative for rash.  Neurological: Negative for dizziness, speech difficulty, weakness, numbness and headaches.  Psychiatric/Behavioral: Positive for suicidal ideas.    Allergies  Review of patient's allergies indicates no known allergies.  Home Medications   Current Outpatient Rx  Name  Route  Sig  Dispense  Refill  . FLUOXETINE HCL 40 MG PO CAPS   Oral   Take 1 capsule (40 mg total) by mouth daily. For depression and anxiety.   30 capsule   0   . IBUPROFEN 200 MG PO TABS   Oral   Take 400 mg by mouth every 6 (six) hours as needed. Pain         . INSULIN ASPART 100 UNIT/ML [place] SOLN   Subcutaneous   Inject 7-14 Units into the skin 3 (three) times daily before meals. For blood sugar control.         . INSULIN GLARGINE 100 UNIT/ML [place] SOLN   Subcutaneous   Inject 22 Units into the skin at bedtime. For blood sugar control.   10 mL   0   . METFORMIN HCL 500 MG PO TABS   Oral   Take 3 tablets (1,500 mg total) by mouth 2 (two) times daily before a meal. For blood sugar control.   180 tablet   0   . TRAZODONE HCL 100 MG PO TABS   Oral   Take 100 mg by mouth at bedtime.         Marland Kitchen  RISPERIDONE 1 MG PO TABS   Oral   Take 1 tablet (1 mg total) by mouth daily. For psychosis.   30 tablet   0     BP 123/81  Pulse 78  Temp 98.3 F (36.8 C) (Oral)  Resp 18  Ht 5\' 9"  (1.753 m)  Wt 195 lb (88.451 kg)  BMI 28.80 kg/m2  SpO2 99%  Physical Exam  Constitutional: He is oriented to person, place, and time. He appears well-developed and well-nourished.  HENT:  Head: Normocephalic and atraumatic.  Eyes: Pupils are equal, round, and reactive to light.  Neck: Normal range of motion. Neck supple.  Cardiovascular: Normal rate, regular rhythm and normal heart sounds.   Pulmonary/Chest: Effort normal and breath sounds normal. No respiratory distress. He has no  wheezes. He has no rales. He exhibits no tenderness.  Abdominal: Soft. Bowel sounds are normal. There is no tenderness. There is no rebound and no guarding.  Musculoskeletal: Normal range of motion. He exhibits no edema.       Mild pain with ROM or right shoulder.  No edema/effusion.  No signs of infection.  NV intact  Lymphadenopathy:    He has no cervical adenopathy.  Neurological: He is alert and oriented to person, place, and time.  Skin: Skin is warm and dry. No rash noted.  Psychiatric: He has a normal mood and affect.    ED Course  Procedures (including critical care time)  Results for orders placed during the hospital encounter of 06/11/12  URINE RAPID DRUG SCREEN (HOSP PERFORMED)      Component Value Range   Opiates NONE DETECTED  NONE DETECTED   Cocaine POSITIVE (*) NONE DETECTED   Benzodiazepines NONE DETECTED  NONE DETECTED   Amphetamines NONE DETECTED  NONE DETECTED   Tetrahydrocannabinol NONE DETECTED  NONE DETECTED   Barbiturates NONE DETECTED  NONE DETECTED  CBC WITH DIFFERENTIAL      Component Value Range   WBC 7.2  4.0 - 10.5 K/uL   RBC 4.25  4.22 - 5.81 MIL/uL   Hemoglobin 12.3 (*) 13.0 - 17.0 g/dL   HCT 11.9 (*) 14.7 - 82.9 %   MCV 83.3  78.0 - 100.0 fL   MCH 28.9  26.0 - 34.0 pg   MCHC 34.7  30.0 - 36.0 g/dL   RDW 56.2  13.0 - 86.5 %   Platelets 233  150 - 400 K/uL   Neutrophils Relative 64  43 - 77 %   Neutro Abs 4.6  1.7 - 7.7 K/uL   Lymphocytes Relative 28  12 - 46 %   Lymphs Abs 2.0  0.7 - 4.0 K/uL   Monocytes Relative 8  3 - 12 %   Monocytes Absolute 0.6  0.1 - 1.0 K/uL   Eosinophils Relative 1  0 - 5 %   Eosinophils Absolute 0.1  0.0 - 0.7 K/uL   Basophils Relative 0  0 - 1 %   Basophils Absolute 0.0  0.0 - 0.1 K/uL  COMPREHENSIVE METABOLIC PANEL      Component Value Range   Sodium 140  135 - 145 mEq/L   Potassium 3.6  3.5 - 5.1 mEq/L   Chloride 101  96 - 112 mEq/L   CO2 28  19 - 32 mEq/L   Glucose, Bld 251 (*) 70 - 99 mg/dL   BUN 6  6 -  23 mg/dL   Creatinine, Ser 7.84  0.50 - 1.35 mg/dL   Calcium 9.7  8.4 - 69.6  mg/dL   Total Protein 7.4  6.0 - 8.3 g/dL   Albumin 3.8  3.5 - 5.2 g/dL   AST 20  0 - 37 U/L   ALT 16  0 - 53 U/L   Alkaline Phosphatase 62  39 - 117 U/L   Total Bilirubin 0.6  0.3 - 1.2 mg/dL   GFR calc non Af Amer >90  >90 mL/min   GFR calc Af Amer >90  >90 mL/min  ETHANOL      Component Value Range   Alcohol, Ethyl (B) <11  0 - 11 mg/dL  GLUCOSE, CAPILLARY      Component Value Range   Glucose-Capillary 289 (*) 70 - 99 mg/dL   No results found.    1. Depression       MDM  Pt now is denying SI to the nurses/ACT.  Awaiting telepsych evaluation        Rolan Bucco, MD 06/11/12 [phone]
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
## 2012-06-12 LAB — GLUCOSE, CAPILLARY
Glucose-Capillary: 216 mg/dL — ABNORMAL HIGH (ref 70–99)
Glucose-Capillary: 171 mg/dL — ABNORMAL HIGH (ref 70–99)
Glucose-Capillary: 167 mg/dL — ABNORMAL HIGH (ref 70–99)
Glucose-Capillary: 158 mg/dL — ABNORMAL HIGH (ref 70–99)

## 2012-06-12 NOTE — ED Notes (Signed)
Up tot he bathroom to shower and changes scrubs 

## 2012-06-12 NOTE — ED Provider Notes (Signed)
Pt states "I still down and out", states he doesn't feel good. States his last admission was 1 year ago and he ran out of his medications a few days ago.  He had expressed SI by shooting himself. He has been seen by ACT and has referral to [facility].   Ward Givens, MD 06/12/12 [phone]

## 2012-06-12 NOTE — ED Notes (Signed)
MD at bedside. 

## 2012-06-13 LAB — GLUCOSE, CAPILLARY: Glucose-Capillary: 250 mg/dL — ABNORMAL HIGH (ref 70–99)

## 2012-06-13 NOTE — ED Notes (Signed)
Up to the bathroom 

## 2012-06-13 NOTE — ED Notes (Signed)
Dr wentz into see 

## 2012-06-13 NOTE — ED Notes (Signed)
Pt is sleeping with respirations even and unlabored

## 2012-06-13 NOTE — ED Provider Notes (Addendum)
[name] is a 51 y.o. male who is here for "racing thoughts about hurting myself". He has been staying with various family members. He uses cocaine. He wants treatment, for drug abuse. He complains of a headache. He has been seen by ACT, and referred to the behavioral health hospital. I will have a CT reevaluate him today.  Flint Melter, MD 06/13/12 [phone]

## 2012-06-14 LAB — GLUCOSE, CAPILLARY

## 2012-06-14 NOTE — ED Notes (Signed)
Pt discharged via ambulance to [facility] 

## 2013-02-03 ENCOUNTER — Encounter (HOSPITAL_COMMUNITY): Payer: Self-pay

## 2013-02-03 ENCOUNTER — Emergency Department (HOSPITAL_COMMUNITY)
Admission: EM | Admit: 2013-02-03 | Discharge: 2013-02-03 | Disposition: A | Discharge status: Home / Self Care | Payer: Medicare Other | Attending: Emergency Medicine | Admitting: Emergency Medicine

## 2013-02-03 ENCOUNTER — Emergency Department (HOSPITAL_COMMUNITY): Payer: Medicare Other

## 2013-02-03 DIAGNOSIS — R0602 Shortness of breath: Secondary | ICD-10-CM | POA: Insufficient documentation

## 2013-02-03 DIAGNOSIS — F259 Schizoaffective disorder, unspecified: Secondary | ICD-10-CM | POA: Insufficient documentation

## 2013-02-03 DIAGNOSIS — Z794 Long term (current) use of insulin: Secondary | ICD-10-CM | POA: Insufficient documentation

## 2013-02-03 DIAGNOSIS — E1169 Type 2 diabetes mellitus with other specified complication: Secondary | ICD-10-CM | POA: Insufficient documentation

## 2013-02-03 DIAGNOSIS — R739 Hyperglycemia, unspecified: Secondary | ICD-10-CM

## 2013-02-03 DIAGNOSIS — R109 Unspecified abdominal pain: Secondary | ICD-10-CM | POA: Insufficient documentation

## 2013-02-03 DIAGNOSIS — F319 Bipolar disorder, unspecified: Secondary | ICD-10-CM | POA: Insufficient documentation

## 2013-02-03 DIAGNOSIS — I10 Essential (primary) hypertension: Secondary | ICD-10-CM | POA: Insufficient documentation

## 2013-02-03 DIAGNOSIS — Z79899 Other long term (current) drug therapy: Secondary | ICD-10-CM | POA: Insufficient documentation

## 2013-02-03 LAB — CBC WITH DIFFERENTIAL/PLATELET
HCT: 34.5 % — ABNORMAL LOW (ref 39.0–52.0)
Hemoglobin: 12.2 g/dL — ABNORMAL LOW (ref 13.0–17.0)
Lymphocytes Relative: 26 % (ref 12–46)
Monocytes Absolute: 0.4 10*3/uL (ref 0.1–1.0)
Monocytes Relative: 6 % (ref 3–12)
Neutro Abs: 3.9 10*3/uL (ref 1.7–7.7)
WBC: 5.9 10*3/uL (ref 4.0–10.5)

## 2013-02-03 LAB — GLUCOSE, CAPILLARY: Glucose-Capillary: 429 mg/dL — ABNORMAL HIGH (ref 70–99)

## 2013-02-03 LAB — URINALYSIS, ROUTINE W REFLEX MICROSCOPIC
Bilirubin Urine: NEGATIVE
Glucose, UA: >1000 mg/dL — AB
Ketones, ur: NEGATIVE mg/dL
Leukocytes, UA: NEGATIVE
pH: 5 (ref 5.0–8.0)

## 2013-02-03 LAB — BASIC METABOLIC PANEL
BUN: 14 mg/dL (ref 6–23)
CO2: 20 mEq/L (ref 19–32)
Chloride: 91 mEq/L — ABNORMAL LOW (ref 96–112)
Creatinine, Ser: 0.92 mg/dL (ref 0.50–1.35)
Glucose, Bld: 778 mg/dL (ref 70–99)

## 2013-02-03 LAB — URINE MICROSCOPIC-ADD ON

## 2013-02-03 MED ORDER — INSULIN ASPART 100 UNIT/ML ~~LOC~~ SOLN
10.0000 [IU] | Freq: Once | SUBCUTANEOUS | Status: AC
  Administered 2013-02-03: 10 [IU] via SUBCUTANEOUS
  Filled 2013-02-03: qty 1

## 2013-02-03 MED ORDER — ONDANSETRON HCL 4 MG/2ML IJ SOLN
4.0000 mg | Freq: Once | INTRAMUSCULAR | Status: AC
  Administered 2013-02-03: 4 mg via INTRAVENOUS
  Filled 2013-02-03: qty 2

## 2013-02-03 MED ORDER — SODIUM CHLORIDE 0.9 % IV SOLN
1000.0000 mL | INTRAVENOUS | Status: DC
  Administered 2013-02-03: 1000 mL via INTRAVENOUS

## 2013-02-03 MED ORDER — INSULIN ASPART 100 UNIT/ML ~~LOC~~ SOLN: 7.0000 [IU] | Freq: Three times a day (TID) | SUBCUTANEOUS | Status: DC

## 2013-02-03 MED ORDER — INSULIN GLARGINE 100 UNIT/ML ~~LOC~~ SOLN: 22.0000 [IU] | Freq: Every day | SUBCUTANEOUS | Status: DC

## 2013-02-03 MED ORDER — SODIUM CHLORIDE 0.9 % IV BOLUS (SEPSIS)
1000.0000 mL | Freq: Once | INTRAVENOUS | Status: AC
  Administered 2013-02-03: 1000 mL via INTRAVENOUS

## 2013-02-03 MED ORDER — MORPHINE SULFATE 4 MG/ML IJ SOLN
4.0000 mg | Freq: Once | INTRAMUSCULAR | Status: AC
  Administered 2013-02-03: 4 mg via INTRAVENOUS
  Filled 2013-02-03: qty 1

## 2013-02-03 MED ORDER — METOCLOPRAMIDE HCL 5 MG/ML IJ SOLN
10.0000 mg | Freq: Once | INTRAMUSCULAR | Status: AC
  Administered 2013-02-03: 10 mg via INTRAVENOUS
  Filled 2013-02-03: qty 2

## 2013-02-03 MED ORDER — METFORMIN HCL 500 MG PO TABS: 1000.0000 mg | ORAL_TABLET | Freq: Two times a day (BID) | ORAL | Status: DC

## 2013-02-03 MED ORDER — SODIUM CHLORIDE 0.9 % IV SOLN
1000.0000 mL | Freq: Once | INTRAVENOUS | Status: AC
  Administered 2013-02-03: 1000 mL via INTRAVENOUS

## 2013-02-03 NOTE — ED Notes (Addendum)
Pt states his sugar last week was in the 700's and yesterday it was 500's. Pt has not checked his sugar yet today. Pt states he has been having general body aches, urinary frequency, and SOB. Pt states he ran out of meds about 6 days ago after moving from [place] to [place].

## 2013-02-03 NOTE — ED Notes (Signed)
CBG of 429 completed

## 2013-02-03 NOTE — ED Provider Notes (Signed)
Medical screening examination/treatment/procedure(s) were performed by non-physician practitioner and as supervising physician I was immediately available for consultation/collaboration.   Charles B. Bernette Mayers, MD 02/03/13 1527

## 2013-02-03 NOTE — ED Provider Notes (Signed)
History    CSN: [id number] Arrival date & time 02/03/13  9528  First MD Initiated Contact with Patient 02/03/13 [phone]     Chief Complaint  Patient presents with  . Hyperglycemia   (Consider location/radiation/quality/duration/timing/severity/associated sxs/prior Treatment) HPI Comments: Patient is a 52 year old male with a past medical history of diabetes, bipolar disorder, hypertension who presents with hyperglycemia for the past 2 days. Patient reports running out of his insulin 4 days ago and has been "running high sugars." Patient states his BG has been in the 700's and today is in the 500's. He reports mild abdominal discomfort and SOB. No aggravating/alleviating factors.   Patient is a 52 y.o. male presenting with hyperglycemia.  Hyperglycemia Associated symptoms: shortness of breath    Past Medical History  Diagnosis Date  . Diabetes mellitus   . Bipolar affective disorder   . Schizophrenia, schizo-affective   . Hypertension   . Diabetes mellitus 03/11/2012  . Bipolar disorder 03/11/2012   Past Surgical History  Procedure Laterality Date  . Finger surgery Right     3rd and 4th digits.   History reviewed. No pertinent family history. History  Substance Use Topics  . Smoking status: Never Smoker   . Smokeless tobacco: Not on file  . Alcohol Use: No    Review of Systems  Constitutional:       Hyperglycemia  Respiratory: Positive for shortness of breath.   All other systems reviewed and are negative.    Allergies  Review of patient's allergies indicates no known allergies.  Home Medications   Current Outpatient Rx  Name  Route  Sig  Dispense  Refill  . FLUoxetine (PROZAC) 40 MG capsule   Oral   Take 1 capsule (40 mg total) by mouth daily. For depression and anxiety.   30 capsule   0   . insulin aspart ([place]OV[place]) 100 UNIT/ML injection   Subcutaneous   Inject 7-14 Units into the skin 3 (three) times daily before meals. For blood sugar control.          . insulin glargine (LANTUS) 100 UNIT/ML injection   Subcutaneous   Inject 22 Units into the skin at bedtime. For blood sugar control.   10 mL   0   . metFORMIN (GLUCOPHAGE) 500 MG tablet   Oral   Take 1,000 mg by mouth 2 (two) times daily with a meal.         . risperiDONE (RISPERDAL) 1 MG tablet   Oral   Take 1 tablet (1 mg total) by mouth daily. For psychosis.   30 tablet   0    BP 123/59  Pulse 65  Temp(Src) 97.4 F (36.3 C) (Oral)  Resp 20  SpO2 98% Physical Exam  Nursing note and vitals reviewed. Constitutional: He is oriented to person, place, and time. He appears well-developed and well-nourished. No distress.  HENT:  Head: Normocephalic and atraumatic.  Eyes: Conjunctivae are normal.  Neck: Normal range of motion.  Cardiovascular: Normal rate and regular rhythm.  Exam reveals no gallop and no friction rub.   No murmur heard. Pulmonary/Chest: Effort normal and breath sounds normal. He has no wheezes. He has no rales. He exhibits no tenderness.  Abdominal: Soft. He exhibits no distension. There is no tenderness. There is no rebound and no guarding.  Musculoskeletal: Normal range of motion.  Neurological: He is alert and oriented to person, place, and time. Coordination normal.  Speech is goal-oriented. Moves limbs without ataxia.   Skin:  Skin is warm and dry.  Psychiatric: He has a normal mood and affect. His behavior is normal.    ED Course  Procedures (including critical care time) Labs Reviewed  CBC WITH DIFFERENTIAL - Abnormal; Notable for the following:    RBC 4.16 (*)    Hemoglobin 12.2 (*)    HCT 34.5 (*)    All other components within normal limits  BASIC METABOLIC PANEL - Abnormal; Notable for the following:    Sodium 125 (*)    Chloride 91 (*)    Glucose, Bld 778 (*)    All other components within normal limits  URINALYSIS, ROUTINE W REFLEX MICROSCOPIC - Abnormal; Notable for the following:    Specific Gravity, Urine 1.031 (*)    Glucose, UA  >1000 (*)    All other components within normal limits  GLUCOSE, CAPILLARY - Abnormal; Notable for the following:    Glucose-Capillary >600 (*)    All other components within normal limits  GLUCOSE, CAPILLARY - Abnormal; Notable for the following:    Glucose-Capillary 429 (*)    All other components within normal limits  GLUCOSE, CAPILLARY - Abnormal; Notable for the following:    Glucose-Capillary 290 (*)    All other components within normal limits  URINE MICROSCOPIC-ADD ON   Dg Chest 2 View  02/03/2013   *RADIOLOGY REPORT*  Clinical Data: Shortness of breath  CHEST - 2 VIEW  Comparison: None.  Findings: Lungs clear.  Heart size and pulmonary vascularity are normal.  No adenopathy.  No bone lesions.  IMPRESSION: No edema or consolidation.   Original Report Authenticated By: Bretta Bang, M.D.   1. Hyperglycemia without ketosis     MDM  11:33 AM Labs show BG of 778. Patient given fluids. Anion gap of 14. Chest xray shows no acute changes. Vitals stable and patient afebrile.   3:24 PM Patient's glucose is trending down. Patient feeling better. Patient will be discharged with insulin. Patient will follow up with PCP. Patient instructed to return to the ED with worsening or concerning symptoms.   Emilia Beck, PA-C 02/03/13 1526

## 2013-02-03 NOTE — ED Notes (Signed)
Given turkey sandwich and sprite zero.  

## 2013-02-03 NOTE — ED Notes (Signed)
Performed cbg on patient.  Glucometer read "hi"  Nurse haley-informed.

## 2013-02-26 ENCOUNTER — Emergency Department (HOSPITAL_COMMUNITY)
Admission: EM | Admit: 2013-02-26 | Discharge: 2013-02-27 | Disposition: A | Discharge status: Other Acute Inpatient Hospital | Payer: Medicare Other | Attending: Emergency Medicine | Admitting: Emergency Medicine

## 2013-02-26 ENCOUNTER — Emergency Department (HOSPITAL_COMMUNITY): Payer: Medicare Other

## 2013-02-26 ENCOUNTER — Encounter (HOSPITAL_COMMUNITY): Payer: Self-pay | Admitting: Emergency Medicine

## 2013-02-26 DIAGNOSIS — F411 Generalized anxiety disorder: Secondary | ICD-10-CM | POA: Insufficient documentation

## 2013-02-26 DIAGNOSIS — R079 Chest pain, unspecified: Secondary | ICD-10-CM | POA: Diagnosis present

## 2013-02-26 DIAGNOSIS — E119 Type 2 diabetes mellitus without complications: Secondary | ICD-10-CM | POA: Diagnosis not present

## 2013-02-26 DIAGNOSIS — Z794 Long term (current) use of insulin: Secondary | ICD-10-CM | POA: Insufficient documentation

## 2013-02-26 DIAGNOSIS — Z79899 Other long term (current) drug therapy: Secondary | ICD-10-CM | POA: Diagnosis not present

## 2013-02-26 DIAGNOSIS — F259 Schizoaffective disorder, unspecified: Secondary | ICD-10-CM | POA: Diagnosis not present

## 2013-02-26 DIAGNOSIS — F319 Bipolar disorder, unspecified: Secondary | ICD-10-CM | POA: Insufficient documentation

## 2013-02-26 DIAGNOSIS — R45851 Suicidal ideations: Secondary | ICD-10-CM | POA: Insufficient documentation

## 2013-02-26 DIAGNOSIS — I10 Essential (primary) hypertension: Secondary | ICD-10-CM | POA: Diagnosis not present

## 2013-02-26 HISTORY — DX: Major depressive disorder, single episode, unspecified: F32.9

## 2013-02-26 HISTORY — DX: Depression, unspecified: F32.A

## 2013-02-26 HISTORY — DX: Anxiety disorder, unspecified: F41.9

## 2013-02-26 LAB — CBC
HCT: 36.8 % — ABNORMAL LOW (ref 39.0–52.0)
Hemoglobin: 12.5 g/dL — ABNORMAL LOW (ref 13.0–17.0)
MCH: 28.9 pg (ref 26.0–34.0)
MCHC: 34 g/dL (ref 30.0–36.0)
MCV: 85 fL (ref 78.0–100.0)
Platelets: 218 10*3/uL (ref 150–400)
RBC: 4.33 MIL/uL (ref 4.22–5.81)
RDW: 12.8 % (ref 11.5–15.5)
WBC: 8 10*3/uL (ref 4.0–10.5)

## 2013-02-26 LAB — POCT I-STAT TROPONIN I

## 2013-02-26 LAB — BASIC METABOLIC PANEL
BUN: 15 mg/dL (ref 6–23)
CO2: 25 mEq/L (ref 19–32)
Calcium: 10 mg/dL (ref 8.4–10.5)
Chloride: 99 mEq/L (ref 96–112)
Creatinine, Ser: 0.98 mg/dL (ref 0.50–1.35)
GFR calc Af Amer: >90 mL/min (ref >90)
GFR calc non Af Amer: >90 mL/min (ref >90)
Glucose, Bld: 324 mg/dL — ABNORMAL HIGH (ref 70–99)
Potassium: 4.1 mEq/L (ref 3.5–5.1)
Sodium: 139 mEq/L (ref 135–145)

## 2013-02-26 LAB — RAPID URINE DRUG SCREEN, HOSP PERFORMED
Amphetamines: NOT DETECTED
Barbiturates: NOT DETECTED
Benzodiazepines: NOT DETECTED
Cocaine: POSITIVE — AB
Opiates: NOT DETECTED
Tetrahydrocannabinol: NOT DETECTED

## 2013-02-26 LAB — GLUCOSE, CAPILLARY
Glucose-Capillary: 141 mg/dL — ABNORMAL HIGH (ref 70–99)
Glucose-Capillary: 144 mg/dL — ABNORMAL HIGH (ref 70–99)

## 2013-02-26 LAB — ETHANOL: Alcohol, Ethyl (B): <11 mg/dL (ref 0–11)

## 2013-02-26 MED ORDER — INSULIN ASPART 100 UNIT/ML ~~LOC~~ SOLN: 7.0000 [IU] | Freq: Three times a day (TID) | SUBCUTANEOUS | Status: DC

## 2013-02-26 MED ORDER — TRAZODONE HCL 50 MG PO TABS: 50.0000 mg | ORAL_TABLET | Freq: Every day | ORAL | Status: DC

## 2013-02-26 MED ORDER — INSULIN ASPART 100 UNIT/ML ~~LOC~~ SOLN
4.0000 [IU] | Freq: Once | SUBCUTANEOUS | Status: AC
  Administered 2013-02-26: 4 [IU] via SUBCUTANEOUS
  Filled 2013-02-26: qty 1

## 2013-02-26 MED ORDER — ZOLPIDEM TARTRATE 5 MG PO TABS: 5.0000 mg | ORAL_TABLET | Freq: Every evening | ORAL | Status: DC | PRN

## 2013-02-26 MED ORDER — RISPERIDONE 0.5 MG PO TABS
1.0000 mg | ORAL_TABLET | Freq: Every day | ORAL | Status: DC
  Administered 2013-02-26 – 2013-02-27 (×2): 1 mg via ORAL
  Filled 2013-02-26 (×2): qty 2

## 2013-02-26 MED ORDER — ONDANSETRON HCL 4 MG PO TABS: 4.0000 mg | ORAL_TABLET | Freq: Three times a day (TID) | ORAL | Status: DC | PRN

## 2013-02-26 MED ORDER — ALUM & MAG HYDROXIDE-SIMETH 200-200-20 MG/5ML PO SUSP: 30.0000 mL | ORAL | Status: DC | PRN

## 2013-02-26 MED ORDER — INSULIN ASPART 100 UNIT/ML ~~LOC~~ SOLN: 8.0000 [IU] | Freq: Once | SUBCUTANEOUS | Status: DC

## 2013-02-26 MED ORDER — METFORMIN HCL 500 MG PO TABS
1000.0000 mg | ORAL_TABLET | Freq: Two times a day (BID) | ORAL | Status: DC
  Administered 2013-02-26 – 2013-02-27 (×2): 1000 mg via ORAL
  Filled 2013-02-26 (×2): qty 2

## 2013-02-26 MED ORDER — IBUPROFEN 400 MG PO TABS: 600.0000 mg | ORAL_TABLET | Freq: Three times a day (TID) | ORAL | Status: DC | PRN

## 2013-02-26 MED ORDER — ACETAMINOPHEN 325 MG PO TABS: 650.0000 mg | ORAL_TABLET | ORAL | Status: DC | PRN

## 2013-02-26 MED ORDER — INSULIN DETEMIR 100 UNIT/ML ~~LOC~~ SOLN
30.0000 [IU] | Freq: Every day | SUBCUTANEOUS | Status: DC
  Administered 2013-02-26: 30 [IU] via SUBCUTANEOUS
  Filled 2013-02-26 (×3): qty 0.3

## 2013-02-26 MED ORDER — LORAZEPAM 1 MG PO TABS: 1.0000 mg | ORAL_TABLET | Freq: Three times a day (TID) | ORAL | Status: DC | PRN

## 2013-02-26 MED ORDER — FLUOXETINE HCL 20 MG PO CAPS
40.0000 mg | ORAL_CAPSULE | Freq: Every day | ORAL | Status: DC
  Administered 2013-02-26 – 2013-02-27 (×2): 40 mg via ORAL
  Filled 2013-02-26 (×2): qty 2

## 2013-02-26 MED ORDER — NICOTINE 21 MG/24HR TD PT24: 21.0000 mg | MEDICATED_PATCH | Freq: Every day | TRANSDERMAL | Status: DC

## 2013-02-26 MED ORDER — INSULIN ASPART 100 UNIT/ML ~~LOC~~ SOLN
0.0000 [IU] | Freq: Three times a day (TID) | SUBCUTANEOUS | Status: DC
  Administered 2013-02-26: 3 [IU] via SUBCUTANEOUS
  Administered 2013-02-26 – 2013-02-27 (×2): 1 [IU] via SUBCUTANEOUS
  Filled 2013-02-26 (×3): qty 1

## 2013-02-26 MED ORDER — DIVALPROEX SODIUM 250 MG PO DR TAB
250.0000 mg | DELAYED_RELEASE_TABLET | Freq: Three times a day (TID) | ORAL | Status: DC
  Administered 2013-02-26 – 2013-02-27 (×2): 250 mg via ORAL
  Filled 2013-02-26 (×2): qty 1
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
## 2013-02-26 NOTE — ED Notes (Signed)
Pt states that "don't feel good, dizzy, maybe hungry." Pt notified that meal tray had been ordered for him and would arrive shortly. NAD noted at this time. Pt laying in bed with sitter at bedside.

## 2013-02-26 NOTE — ED Notes (Signed)
Report taken from [place], [place]. Pt moved to Pod C. Pt placed in camera room. Verified with charge RN that sitter had been called for. Air cabin crew would be sent as soon as available. Pt ambulatory to room. NAD noted. Pt denies pain. Pt states he is tired and just wants to sleep.

## 2013-02-26 NOTE — ED Notes (Signed)
New EKG given to Dr Preston Fleeting

## 2013-02-26 NOTE — ED Notes (Addendum)
Pt questioned about when he takes his insulin. Pt states usually with meals. Pt told his glucose 144, pt states he does not want to take the novolog injection at this time. Pt states "that's pretty good." Pt states again that he does not want to take novolog at this time. Pt states he will take his levemir at bedtime tonight as ordered.

## 2013-02-26 NOTE — ED Notes (Signed)
CBG [address], [place] notified

## 2013-02-26 NOTE — ED Provider Notes (Signed)
Date: 02/26/2013  Rate: 104  Rhythm: sinus tachycardia  QRS Axis: normal  Intervals: QT prolonged  ST/T Wave abnormalities: early repolarization  Conduction Disutrbances:none  Narrative Interpretation: Sinus tachycardia, early repolarization, borderline prolonged QT interval. No prior ECG available for comparison.  Old EKG Reviewed: none available  Medical screening examination/treatment/procedure(s) were performed by non-physician practitioner and as supervising physician I was immediately available for consultation/collaboration.   Dione Booze, MD 02/26/13 [phone]

## 2013-02-26 NOTE — ED Notes (Addendum)
ASA 324mg  and 2 NTG given by EMS.  CBG 236

## 2013-02-26 NOTE — ED Notes (Signed)
Security called to want PT . Pt in blue scrubs.

## 2013-02-26 NOTE — [facility] Counselor (Signed)
[name], ACT counselor at [facility], submitted Pt for admission to [facility]. Per Berneice Heinrich, [facility] there is not an appropriate bed available. Pt will be considered for admission when a bed is available and until then placement should be pursued at other facilities.  [name], LPC, [facility] Assessment Counselor

## 2013-02-26 NOTE — ED Notes (Signed)
Called pharmacy and verified that pts medication would be sent to ER shortly

## 2013-02-26 NOTE — ED Notes (Signed)
Pt started experiencing chest pain this morning playing basketball. Pt states his pain has resolved. Pt also expressed suicidal ideation with a plan to shoot himself due to loss of father on June 10th.
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
## 2013-02-27 DIAGNOSIS — R45851 Suicidal ideations: Secondary | ICD-10-CM | POA: Diagnosis not present

## 2013-02-27 NOTE — ED Notes (Signed)
MD at bedside. 

## 2013-02-27 NOTE — BH Assessment (Signed)
[facility] Assessment Progress Note   Patient has been accepted to [facility] according to nurse Earlie Lou there. She called at 00:30 to notify that Dr. Harlow Asa accepted patient to [facility] psychiatric inpatient service. Dr. Benjiman Core ([facility]) completed IVC papers. Patient will go via Sheriff's dept transport. Patient will go to [facility] at [facility]. Nurse call report to [phone].

## 2013-02-27 NOTE — ED Provider Notes (Signed)
Patient accepted to [facility] by Dr. Regino Schultze.  BP 94/61  Pulse 71  Temp(Src) 98.5 F (36.9 C) (Oral)  Resp 14  SpO2 99%   Glynn Octave, MD 02/27/13 1145

## 2013-02-27 NOTE — ED Notes (Signed)
Patient is alert and orientedx4.  Patient was explained discharge instructions and they understood them with no questions.  Patient is being transported to [place] by Deputy Amanda Scott. 

## 2013-02-27 NOTE — ED Provider Notes (Signed)
Medical screening examination/treatment/procedure(s) were performed by non-physician practitioner and as supervising physician I was immediately available for consultation/collaboration.   Rolan Bucco, MD 02/27/13 [phone]

## 2013-02-27 NOTE — ED Notes (Signed)
Patient is resting comfortably, with sitter at the bedside. 

## 2013-02-27 NOTE — ED Notes (Signed)
Patient is resting comfortably, sitter at the bedside. 

## 2013-02-27 NOTE — ED Notes (Signed)
I spoke with Deputy Lorin Picket and she advised it would be 1030 or 1100 before they could transport the patient to [facility].

## 2013-06-30 ENCOUNTER — Encounter (HOSPITAL_COMMUNITY): Payer: Self-pay | Admitting: Emergency Medicine

## 2013-06-30 ENCOUNTER — Encounter (HOSPITAL_COMMUNITY): Payer: Self-pay

## 2013-06-30 ENCOUNTER — Inpatient Hospital Stay (HOSPITAL_COMMUNITY)
Admission: AD | Admit: 2013-06-30 | Discharge: 2013-07-08 | DRG: 885 | Disposition: A | Discharge status: Home / Self Care | Payer: Medicaid Other | Source: Intra-hospital | Attending: Psychiatry | Admitting: Psychiatry

## 2013-06-30 ENCOUNTER — Emergency Department (HOSPITAL_COMMUNITY)
Admission: EM | Admit: 2013-06-30 | Discharge: 2013-06-30 | Disposition: A | Discharge status: Other Acute Inpatient Hospital | Payer: Medicare Other | Attending: Emergency Medicine | Admitting: Emergency Medicine

## 2013-06-30 DIAGNOSIS — F319 Bipolar disorder, unspecified: Secondary | ICD-10-CM | POA: Insufficient documentation

## 2013-06-30 DIAGNOSIS — I10 Essential (primary) hypertension: Secondary | ICD-10-CM | POA: Insufficient documentation

## 2013-06-30 DIAGNOSIS — R45851 Suicidal ideations: Secondary | ICD-10-CM

## 2013-06-30 DIAGNOSIS — B1920 Unspecified viral hepatitis C without hepatic coma: Secondary | ICD-10-CM | POA: Diagnosis present

## 2013-06-30 DIAGNOSIS — Z79899 Other long term (current) drug therapy: Secondary | ICD-10-CM | POA: Insufficient documentation

## 2013-06-30 DIAGNOSIS — F1410 Cocaine abuse, uncomplicated: Secondary | ICD-10-CM | POA: Diagnosis present

## 2013-06-30 DIAGNOSIS — F1414 Cocaine abuse with cocaine-induced mood disorder: Secondary | ICD-10-CM

## 2013-06-30 DIAGNOSIS — Z794 Long term (current) use of insulin: Secondary | ICD-10-CM | POA: Insufficient documentation

## 2013-06-30 DIAGNOSIS — F1994 Other psychoactive substance use, unspecified with psychoactive substance-induced mood disorder: Secondary | ICD-10-CM | POA: Diagnosis present

## 2013-06-30 DIAGNOSIS — E119 Type 2 diabetes mellitus without complications: Secondary | ICD-10-CM | POA: Insufficient documentation

## 2013-06-30 DIAGNOSIS — F1210 Cannabis abuse, uncomplicated: Secondary | ICD-10-CM

## 2013-06-30 DIAGNOSIS — F259 Schizoaffective disorder, unspecified: Principal | ICD-10-CM | POA: Diagnosis present

## 2013-06-30 DIAGNOSIS — F251 Schizoaffective disorder, depressive type: Secondary | ICD-10-CM

## 2013-06-30 DIAGNOSIS — K759 Inflammatory liver disease, unspecified: Secondary | ICD-10-CM

## 2013-06-30 DIAGNOSIS — F411 Generalized anxiety disorder: Secondary | ICD-10-CM | POA: Insufficient documentation

## 2013-06-30 DIAGNOSIS — R45850 Homicidal ideations: Secondary | ICD-10-CM | POA: Insufficient documentation

## 2013-06-30 DIAGNOSIS — R4589 Other symptoms and signs involving emotional state: Secondary | ICD-10-CM

## 2013-06-30 HISTORY — DX: Inflammatory liver disease, unspecified: K75.9

## 2013-06-30 LAB — CBC
HCT: 39.2 % (ref 39.0–52.0)
Hemoglobin: 13.8 g/dL (ref 13.0–17.0)
MCH: 30.4 pg (ref 26.0–34.0)
MCV: 86.3 fL (ref 78.0–100.0)
RBC: 4.54 MIL/uL (ref 4.22–5.81)

## 2013-06-30 LAB — COMPREHENSIVE METABOLIC PANEL
CO2: 24 mEq/L (ref 19–32)
Calcium: 9.8 mg/dL (ref 8.4–10.5)
Creatinine, Ser: 1.12 mg/dL (ref 0.50–1.35)
GFR calc Af Amer: 86 mL/min — ABNORMAL LOW (ref >90)
GFR calc non Af Amer: 74 mL/min — ABNORMAL LOW (ref >90)
Glucose, Bld: 451 mg/dL — ABNORMAL HIGH (ref 70–99)
Total Bilirubin: 1 mg/dL (ref 0.3–1.2)

## 2013-06-30 LAB — GLUCOSE, CAPILLARY: Glucose-Capillary: 246 mg/dL — ABNORMAL HIGH (ref 70–99)

## 2013-06-30 LAB — ACETAMINOPHEN LEVEL: Acetaminophen (Tylenol), Serum: <15 ug/mL (ref 10–30)

## 2013-06-30 LAB — RAPID URINE DRUG SCREEN, HOSP PERFORMED
Amphetamines: NOT DETECTED
Barbiturates: NOT DETECTED

## 2013-06-30 LAB — VALPROIC ACID LEVEL: Valproic Acid Lvl: <10 ug/mL — ABNORMAL LOW (ref 50.0–100.0)

## 2013-06-30 LAB — SALICYLATE LEVEL: Salicylate Lvl: <2 mg/dL — ABNORMAL LOW (ref 2.8–20.0)

## 2013-06-30 MED ORDER — INSULIN ASPART 100 UNIT/ML ~~LOC~~ SOLN
7.0000 [IU] | Freq: Three times a day (TID) | SUBCUTANEOUS | Status: DC
  Administered 2013-06-30: 10 [IU] via SUBCUTANEOUS

## 2013-06-30 MED ORDER — DIVALPROEX SODIUM 250 MG PO DR TAB
250.0000 mg | DELAYED_RELEASE_TABLET | Freq: Three times a day (TID) | ORAL | Status: DC
  Administered 2013-06-30: 250 mg via ORAL
  Filled 2013-06-30: qty 1

## 2013-06-30 MED ORDER — FLUOXETINE HCL 20 MG PO CAPS
40.0000 mg | ORAL_CAPSULE | Freq: Every day | ORAL | Status: DC
  Filled 2013-06-30: qty 2

## 2013-06-30 MED ORDER — TRAZODONE HCL 50 MG PO TABS
50.0000 mg | ORAL_TABLET | Freq: Every day | ORAL | Status: DC
Start: 2013-06-30 — End: 2013-06-30

## 2013-06-30 MED ORDER — METFORMIN HCL 500 MG PO TABS: 1000.0000 mg | ORAL_TABLET | Freq: Two times a day (BID) | ORAL | Status: DC

## 2013-06-30 MED ORDER — RISPERIDONE 0.5 MG PO TABS
1.0000 mg | ORAL_TABLET | Freq: Every day | ORAL | Status: DC
  Administered 2013-06-30: 1 mg via ORAL
  Filled 2013-06-30: qty 2

## 2013-06-30 MED ORDER — METFORMIN HCL 500 MG PO TABS
1000.0000 mg | ORAL_TABLET | Freq: Two times a day (BID) | ORAL | Status: DC
  Administered 2013-06-30: 1000 mg via ORAL
  Filled 2013-06-30: qty 2

## 2013-06-30 MED ORDER — INSULIN ASPART 100 UNIT/ML ~~LOC~~ SOLN
7.0000 [IU] | Freq: Three times a day (TID) | SUBCUTANEOUS | Status: DC
  Filled 2013-06-30: qty 1

## 2013-06-30 MED ORDER — INSULIN DETEMIR 100 UNIT/ML ~~LOC~~ SOLN
30.0000 [IU] | Freq: Every day | SUBCUTANEOUS | Status: DC
  Filled 2013-06-30: qty 0.3
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
## 2013-06-30 NOTE — ED Notes (Signed)
Ron with house coverage aware of pt and sitter will come at 3, Consulting civil engineer aware

## 2013-06-30 NOTE — ED Notes (Signed)
Dinner ordered. Pt given Sprite.

## 2013-06-30 NOTE — ED Notes (Signed)
Relieved sitter for lunch.

## 2013-06-30 NOTE — ED Notes (Signed)
Checked pt CBG was 246
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
## 2013-06-30 NOTE — ED Notes (Signed)
Belongings checked off and placed in bins.  Cell phone, license, debit card, and cards given to security.

## 2013-06-30 NOTE — BH Assessment (Signed)
[facility] Assessment Progress Note  At 19:48 I spoke to EDP Dr Anitra Lauth in anticipation of TTS assessment scheduled for 20:00.  Doylene Canning, MA Triage Specialist 06/30/2013 @ 19:52

## 2013-07-01 DIAGNOSIS — F1410 Cocaine abuse, uncomplicated: Secondary | ICD-10-CM

## 2013-07-01 DIAGNOSIS — F322 Major depressive disorder, single episode, severe without psychotic features: Secondary | ICD-10-CM

## 2013-07-01 DIAGNOSIS — R45851 Suicidal ideations: Secondary | ICD-10-CM

## 2013-07-01 DIAGNOSIS — R45850 Homicidal ideations: Secondary | ICD-10-CM

## 2013-07-01 LAB — GLUCOSE, CAPILLARY
Glucose-Capillary: 253 mg/dL — ABNORMAL HIGH (ref 70–99)
Glucose-Capillary: 248 mg/dL — ABNORMAL HIGH (ref 70–99)
Glucose-Capillary: 303 mg/dL — ABNORMAL HIGH (ref 70–99)

## 2013-07-01 MED ORDER — TRAZODONE HCL 50 MG PO TABS: 50.0000 mg | ORAL_TABLET | Freq: Every evening | ORAL | Status: DC | PRN

## 2013-07-01 MED ORDER — INSULIN ASPART 100 UNIT/ML ~~LOC~~ SOLN
0.0000 [IU] | Freq: Three times a day (TID) | SUBCUTANEOUS | Status: DC
  Administered 2013-07-01 – 2013-07-02 (×3): 5 [IU] via SUBCUTANEOUS
  Administered 2013-07-02: 3 [IU] via SUBCUTANEOUS
  Administered 2013-07-02 – 2013-07-03 (×2): 5 [IU] via SUBCUTANEOUS
  Administered 2013-07-03: 2 [IU] via SUBCUTANEOUS
  Administered 2013-07-03 – 2013-07-04 (×2): 3 [IU] via SUBCUTANEOUS
  Administered 2013-07-04: 5 [IU] via SUBCUTANEOUS
  Administered 2013-07-04 – 2013-07-05 (×4): 3 [IU] via SUBCUTANEOUS
  Administered 2013-07-06: 8 [IU] via SUBCUTANEOUS
  Administered 2013-07-06 (×2): 3 [IU] via SUBCUTANEOUS
  Administered 2013-07-07 (×2): 5 [IU] via SUBCUTANEOUS
  Administered 2013-07-07: 8 [IU] via SUBCUTANEOUS
  Administered 2013-07-08: 5 [IU] via SUBCUTANEOUS

## 2013-07-01 MED ORDER — ACETAMINOPHEN 325 MG PO TABS: 650.0000 mg | ORAL_TABLET | Freq: Four times a day (QID) | ORAL | Status: DC | PRN

## 2013-07-01 MED ORDER — ALUM & MAG HYDROXIDE-SIMETH 200-200-20 MG/5ML PO SUSP: 30.0000 mL | ORAL | Status: DC | PRN

## 2013-07-01 MED ORDER — MAGNESIUM HYDROXIDE 400 MG/5ML PO SUSP: 30.0000 mL | Freq: Every day | ORAL | Status: DC | PRN

## 2013-07-01 MED ORDER — INSULIN ASPART 100 UNIT/ML ~~LOC~~ SOLN
4.0000 [IU] | Freq: Three times a day (TID) | SUBCUTANEOUS | Status: DC
  Administered 2013-07-01 – 2013-07-08 (×18): 4 [IU] via SUBCUTANEOUS

## 2013-07-01 MED ORDER — INSULIN DETEMIR 100 UNIT/ML ~~LOC~~ SOLN
40.0000 [IU] | Freq: Every day | SUBCUTANEOUS | Status: DC
  Administered 2013-07-01 – 2013-07-07 (×8): 40 [IU] via SUBCUTANEOUS

## 2013-07-01 MED ORDER — METFORMIN HCL 500 MG PO TABS
1000.0000 mg | ORAL_TABLET | Freq: Two times a day (BID) | ORAL | Status: DC
  Administered 2013-07-01 – 2013-07-08 (×15): 1000 mg via ORAL
  Filled 2013-07-01 (×19): qty 2

## 2013-07-01 NOTE — [facility] Group Notes (Signed)
[facility] LCSW Aftercare Discharge Planning Group Note   07/01/2013 1:13 PM    Participation Quality:  Appropraite  Mood/Affect:  Appropriate  Depression Rating:  10  Anxiety Rating:  10  Thoughts of Suicide:  Yes  Will you contract for safety?   Yes Current AVH:  No  Plan for Discharge/Comments:  Patient attended discharge planning group and actively participated in group. He is requesting referral for residential treatment. CSW provided all participants with daily workbook.   Transportation Means: Patient has transportation.   Supports:  Patient has a support system.   [name], Joesph July

## 2013-07-01 NOTE — B[facility] Counselor (Signed)
Adult Comprehensive Assessment  Patient ID: Deniz Hannan, male   DOB: January 17, 1961, 52 y.o.   MRN: [id number]  Information Source: Information source: Patient  Current Stressors:  Educational / Learning stressors: None Employment / Job issues: None -  patient is disabled Family Relationships: None Surveyor, quantity / Lack of resources (include bankruptcy): Making it okay on [employer] / Lack of housing: Patient has a place to live but does not want to return there Physical health (include injuries & life threatening diseases): Diabetes, Hep C Social relationships: None Substance abuse: Abuses crack cocaine and THC  Living/Environment/Situation:  Living Arrangements: Other relatives Living conditions (as described by patient or guardian): fair How long has patient lived in current situation?: Six months What is atmosphere in current home: Chaotic  Family History:  Marital status: Divorced Divorced, when?: One year What types of issues is patient dealing with in the relationship?: None Does patient have children?: Yes How many children?: 1 How is patient's relationship with their children?: Good relationship with adult daughter  Childhood History:  By whom was/is the patient raised?: Mother Additional childhood history information: Good childhood Description of patient's relationship with caregiver when they were a child: Good relationship Patient's description of current relationship with people who raised him/her: Very good relationship Does patient have siblings?: Yes Number of Siblings: 1 Description of patient's current relationship with siblings: Good relationship with sister Did patient suffer from severe childhood neglect?: No Has patient ever been sexually abused/assaulted/raped as an adolescent or adult?: No Was the patient ever a victim of a crime or a disaster?: No Witnessed domestic violence?: No Has patient been effected by domestic violence as an adult?:  No  Education:  Currently a Consulting civil engineer?: No Learning disability?: No  Employment/Work Situation:   Employment situation: On disability Why is patient on disability: Mental Health How long has patient been on disability: One year Patient's job has been impacted by current illness: No What is the longest time patient has a held a job?: 13 years 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 Has patient ever been in the [employer]?: No Has patient ever served in Buyer, retail?: No  Financial Resources:   Surveyor, quantity resources: Administrator, Civil Service SSDI;Medicaid;Medicare Does patient have a representative payee or guardian?: No  Alcohol/Substance Abuse:   If attempted suicide, did drugs/alcohol play a role in this?: No Alcohol/Substance Abuse Treatment Hx: Past Tx, Inpatient If yes, describe treatment: [facility] Has alcohol/substance abuse ever caused legal problems?: No  Social Support System:   Forensic psychologist System: None Describe Community Support System: N/A Type of faith/religion: None How does patient's faith help to cope with current illness?: N/A  Leisure/Recreation:   Leisure and Hobbies: Watch TV  Strengths/Needs:   What things does the patient do well?: Kind to others In what areas does patient struggle / problems for patient: Drug addiction and depression  Discharge Plan:   Does patient have access to transportation?: Yes Will patient be returning to same living situation after discharge?: No Plan for living situation after discharge: Requesting referral to residential treatment Currently receiving community mental health services: No If no, would patient like referral for services when discharged?: Yes (What county?) [facility]) Does patient have financial barriers related to discharge medications?: No  Summary/Recommendations:  Kahner Yanik is a 52 year old African American male admitted with Schizoaffective Disorder.  He will  benefit from crisis stabilization, evaluation for medication, psycho-education groups for coping skills development, group therapy and case management for discharge  planning.     Margrit Minner, Joesph July. 07/01/2013
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
## 2013-07-01 NOTE — [facility] Group Notes (Signed)
[facility] LCSW Group Therapy  Feelings Around Relapse 1:15 -2:30        07/01/2013  2:26 PM   Type of Therapy:  Group Therapy  Participation Level:   Did not attend group.   [name] 2:26 PM
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
## 2013-07-01 NOTE — Tx Team (Signed)
Initial Interdisciplinary Treatment Plan  PATIENT STRENGTHS: (choose at least two) Ability for insight Average or above average intelligence Financial means General fund of knowledge Motivation for treatment/growth Supportive family/friends  PATIENT STRESSORS: Loss of brother by murder   PROBLEM LIST: Problem List/Patient Goals Date to be addressed Date deferred Reason deferred Estimated date of resolution  HI- towards his brother's murder 12/5     SI 12/5     Depression 12/5     Anxiety 12/5     Relapsed on cocaine and THC 12/5                              DISCHARGE CRITERIA:  Ability to meet basic life and health needs Improved stabilization in mood, thinking, and/or behavior Medical problems require only outpatient monitoring Motivation to continue treatment in a less acute level of care Need for constant or close observation no longer present Reduction of life-threatening or endangering symptoms to within safe limits Verbal commitment to aftercare and medication compliance  PRELIMINARY DISCHARGE PLAN: Attend PHP/IOP  PATIENT/FAMIILY INVOLVEMENT: This treatment plan has been presented to and reviewed with the patient, [name].  The patient and family have been given the opportunity to ask questions and make suggestions.  Krishauna Schatzman A 07/01/2013, 5:46 AM

## 2013-07-01 NOTE — Care Management Utilization Note (Signed)
Per State Regulation 482.30  The chart was reviewed for necessity with respect to the patient's Admission/ Duration of stay. 06/30/13  Next Review Date: 07/03/13  Lacinda Axon, RN, BSN
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
## 2013-07-01 NOTE — [facility] Suicide Risk Assessment (Signed)
Suicide Risk Assessment  Admission Assessment     Nursing information obtained from:    Demographic factors:    Current Mental Status:    Loss Factors:    Historical Factors:    Risk Reduction Factors:     CLINICAL FACTORS:   Severe Anxiety and/or Agitation Depression:   Anhedonia Hopelessness Impulsivity Insomnia Recent sense of peace/wellbeing Severe Alcohol/Substance Abuse/Dependencies Schizophrenia:   Command hallucinatons Depressive state Less than 19 years old Paranoid or undifferentiated type Currently Psychotic Unstable or Poor Therapeutic Relationship Previous Psychiatric Diagnoses and Treatments Medical Diagnoses and Treatments/Surgeries  COGNITIVE FEATURES THAT CONTRIBUTE TO RISK:  Closed-mindedness Loss of executive function Polarized thinking Thought constriction (tunnel vision)    SUICIDE RISK:   Moderate:  Frequent suicidal ideation with limited intensity, and duration, some specificity in terms of plans, no associated intent, good self-control, limited dysphoria/symptomatology, some risk factors present, and identifiable protective factors, including available and accessible social support.  PLAN OF CARE: Admit for crisis stabilization, safety monitoring and medication management.  I certify that inpatient services furnished can reasonably be expected to improve the patient's condition.  Nehemiah Settle., M.D. 07/01/2013, 12:25 PM

## 2013-07-02 DIAGNOSIS — F1994 Other psychoactive substance use, unspecified with psychoactive substance-induced mood disorder: Secondary | ICD-10-CM

## 2013-07-02 DIAGNOSIS — F411 Generalized anxiety disorder: Secondary | ICD-10-CM

## 2013-07-02 DIAGNOSIS — F1910 Other psychoactive substance abuse, uncomplicated: Secondary | ICD-10-CM

## 2013-07-02 DIAGNOSIS — F259 Schizoaffective disorder, unspecified: Principal | ICD-10-CM

## 2013-07-02 LAB — GLUCOSE, CAPILLARY
Glucose-Capillary: 192 mg/dL — ABNORMAL HIGH (ref 70–99)
Glucose-Capillary: 216 mg/dL — ABNORMAL HIGH (ref 70–99)
Glucose-Capillary: 304 mg/dL — ABNORMAL HIGH (ref 70–99)

## 2013-07-02 MED ORDER — QUETIAPINE FUMARATE 100 MG PO TABS
100.0000 mg | ORAL_TABLET | Freq: Every day | ORAL | Status: DC
  Administered 2013-07-02 – 2013-07-03 (×2): 100 mg via ORAL
  Filled 2013-07-02 (×5): qty 1

## 2013-07-02 MED ORDER — IBUPROFEN 800 MG PO TABS
800.0000 mg | ORAL_TABLET | Freq: Four times a day (QID) | ORAL | Status: DC | PRN
  Administered 2013-07-04: 800 mg via ORAL
  Filled 2013-07-02: qty 1

## 2013-07-02 MED ORDER — DULOXETINE HCL 30 MG PO CPEP
30.0000 mg | ORAL_CAPSULE | Freq: Every day | ORAL | Status: DC
  Administered 2013-07-02 – 2013-07-04 (×3): 30 mg via ORAL
  Filled 2013-07-02 (×6): qty 1

## 2013-07-02 NOTE — Progress Notes (Signed)
Adult Psychoeducational Group Note  Date:  07/02/2013 Time:  2:28 PM  Group Topic/Focus:  Healthy Communication:   The focus of this group is to discuss communication, barriers to communication, as well as healthy ways to communicate with others.  Participation Level:  Did Not Attend    Additional Comments:  Pts are encouraged to attend all group sessions. Pt stayed in bed to sleep.   Tora Perches N 07/02/2013, 2:28 PM

## 2013-07-02 NOTE — Progress Notes (Signed)
D) Pt rates his depression and hopelessness both at a 10. Has thoughts of SI. Has attended some of the groups today. Affect is flat and mood depressed. Eye contact is poor.

## 2013-07-02 NOTE — Progress Notes (Signed)
Patient has been asleep and isolative to his room this evening. MHT informed patient of medication due and he came to window to receive his insulin. Patient is guarded and very abrupt when Clinical research associate spoke with him. Patient c/o feeling bad, reports that he feels like he is coming down with the flu and vitals were taken temp- 97.6, bp standing- 125/71. Patient reports abdominal pain and was offered maalox or tylenol and he refused both. Writer asked if he felt suicidal/homicidal and he reports passive si and verbally contracts, reports hi towards someone but did not care to tell writer the name of the person. Writer spoke with patient briefly on being angry/bitter and how it can effect ones health. Writer encouraged patient to attempt to let his anger go. Patient was receptive and returned to his room after his medication. Safety maintained on unit with 15 min checks.

## 2013-07-02 NOTE — Progress Notes (Signed)
[facility] MD Progress Note  07/02/2013 4:19 PM [name]  MRN:  [id number] Subjective:  Sleep and appetite are poor, experiencing aches and pains all over--withdrawal symptoms--ibuprofen ordered, depression continues with suicidal ideations, depression and mood stabilizers started with anxiety medications Diagnosis:   DSM5:  Substance/Addictive Disorders:  Cannabis Use Disorder - Severe (304.30) Depressive Disorders:  Major Depressive Disorder - Severe (296.23)  Axis I: Anxiety Disorder NOS, Schizoaffective Disorder, Substance Abuse and Substance Induced Mood Disorder Axis II: Deferred Axis III:  Past Medical History  Diagnosis Date  . Diabetes mellitus   . Bipolar affective disorder   . Schizophrenia, schizo-affective   . Hypertension   . Diabetes mellitus 03/11/2012  . Bipolar disorder 03/11/2012  . Anxiety   . Depression   . Hepatitis 06/30/2013    Type C   Axis IV: economic problems, housing problems, other psychosocial or environmental problems, problems related to social environment and problems with primary support group Axis V: 41-50 serious symptoms  ADL's:  Intact  Sleep: Poor  Appetite:  Poor  Suicidal Ideation:  Plan:  overdose Intent:  yes Means:  yes Homicidal Ideation:  Denies  AEB (as evidenced by):  Psychiatric Specialty Exam: Review of Systems  Constitutional: Positive for malaise/fatigue.  HENT: Negative.   Eyes: Negative.   Cardiovascular: Negative.   Gastrointestinal: Negative.   Genitourinary: Negative.   Musculoskeletal: Positive for myalgias.  Skin: Negative.   Neurological: Negative.   Endo/Heme/Allergies: Negative.   Psychiatric/Behavioral: Positive for depression, suicidal ideas and substance abuse. The patient is nervous/anxious.     Blood pressure 127/87, pulse 68, temperature 98.1 F (36.7 C), resp. rate 10, height 5' 8.75" (1.746 m), weight 98.884 kg (218 lb).Body mass index is 32.44 kg/(m^2).  General Appearance: Casual  Eye  Contact::  Fair  Speech:  Normal Rate  Volume:  Normal  Mood:  Anxious and Depressed  Affect:  Depressed  Thought Process:  Coherent  Orientation:  Full (Time, Place, and Person)  Thought Content:  WDL  Suicidal Thoughts:  Yes.  with intent/plan  Homicidal Thoughts:  No  Memory:  Immediate;   Fair Recent;   Fair Remote;   Fair  Judgement:  Poor  Insight:  Fair  Psychomotor Activity:  Decreased  Concentration:  Fair  Recall:  Fair  Akathisia:  No  Handed:  Right  AIMS (if indicated):     Assets:  Resilience Social Support  Sleep:  Number of Hours: 4.5   Current Medications: Current Facility-Administered Medications  Medication Dose Route Frequency Provider Last Rate Last Dose  . alum & mag hydroxide-simeth (MAALOX/MYLANTA) 200-200-20 MG/5ML suspension 30 mL  30 mL Oral Q4H PRN Kristeen Mans, NP      . DULoxetine (CYMBALTA) DR capsule 30 mg  30 mg Oral Daily Nanine Means, NP      . ibuprofen (ADVIL,MOTRIN) tablet 800 mg  800 mg Oral Q6H PRN Nanine Means, NP      . insulin aspart (novoLOG) injection 0-15 Units  0-15 Units Subcutaneous TID WC Kristeen Mans, NP   5 Units at 07/02/13 1157  . insulin aspart (novoLOG) injection 4 Units  4 Units Subcutaneous TID WC Kristeen Mans, NP   4 Units at 07/02/13 1156  . insulin detemir (LEVEMIR) injection 40 Units  40 Units Subcutaneous QHS Kristeen Mans, NP   40 Units at 07/01/13 2149  . magnesium hydroxide (MILK OF MAGNESIA) suspension 30 mL  30 mL Oral Daily PRN Kristeen Mans, NP      .  metFORMIN (GLUCOPHAGE) tablet 1,000 mg  1,000 mg Oral BID WC Kristeen Mans, NP   1,000 mg at 07/02/13 0840  . traZODone (DESYREL) tablet 50 mg  50 mg Oral QHS PRN Kristeen Mans, NP        Lab Results:  Results for orders placed during the hospital encounter of 06/30/13 (from the past 48 hour(s))  GLUCOSE, CAPILLARY     Status: Abnormal   Collection Time    07/01/13  1:10 AM      Result Value Range   Glucose-Capillary 253 (*) 70 - 99 mg/dL  GLUCOSE,  CAPILLARY     Status: Abnormal   Collection Time    07/01/13  6:26 AM      Result Value Range   Glucose-Capillary 248 (*) 70 - 99 mg/dL  GLUCOSE, CAPILLARY     Status: Abnormal   Collection Time    07/01/13 11:37 AM      Result Value Range   Glucose-Capillary 243 (*) 70 - 99 mg/dL  GLUCOSE, CAPILLARY     Status: Abnormal   Collection Time    07/01/13  4:57 PM      Result Value Range   Glucose-Capillary 245 (*) 70 - 99 mg/dL   Comment 1 Notify RN    GLUCOSE, CAPILLARY     Status: Abnormal   Collection Time    07/01/13  6:45 PM      Result Value Range   Glucose-Capillary 252 (*) 70 - 99 mg/dL  GLUCOSE, CAPILLARY     Status: Abnormal   Collection Time    07/01/13  8:57 PM      Result Value Range   Glucose-Capillary 303 (*) 70 - 99 mg/dL   Comment 1 Documented in Chart    GLUCOSE, CAPILLARY     Status: Abnormal   Collection Time    07/02/13  6:25 AM      Result Value Range   Glucose-Capillary 192 (*) 70 - 99 mg/dL   Comment 1 Documented in Chart    GLUCOSE, CAPILLARY     Status: Abnormal   Collection Time    07/02/13 11:42 AM      Result Value Range   Glucose-Capillary 230 (*) 70 - 99 mg/dL   Comment 1 Notify RN      Physical Findings: AIMS: Facial and Oral Movements Muscles of Facial Expression: None, normal Lips and Perioral Area: None, normal Jaw: None, normal Tongue: None, normal,Extremity Movements Upper (arms, wrists, hands, fingers): None, normal Lower (legs, knees, ankles, toes): None, normal, Trunk Movements Neck, shoulders, hips: None, normal, Overall Severity Severity of abnormal movements (highest score from questions above): None, normal Incapacitation due to abnormal movements: None, normal Patient's awareness of abnormal movements (rate only patient's report): No Awareness, Dental Status Current problems with teeth and/or dentures?: No Does patient usually wear dentures?: No  CIWA:    COWS:     Treatment Plan Summary: Daily contact with patient to  assess and evaluate symptoms and progress in treatment Medication management  Plan:  Review of chart, vital signs, medications, and notes. 1-Individual and group therapy 2-Medication management for depression and anxiety:  Medications reviewed with the patient and Wellbutrin started for depression, Vistaril for anxiety, and ibuprofen for his aches and pains 3-Coping skills for depression, anxiety, and death of his brother 4-Continue crisis stabilization and management 5-Address health issues--monitoring vital signs, stable 6-Treatment plan in progress to prevent relapse of depression, substance abuse, and anxiety  Medical Decision Making Problem Points:  Established problem, stable/improving (1) and Review of psycho-social stressors (1) Data Points:  Review of new medications or change in dosage (2)  I certify that inpatient services furnished can reasonably be expected to improve the patient's condition.   Nanine Means, PMH-NP 07/02/2013, 4:19 PM I agreed with findings and treatment plan of this patient

## 2013-07-02 NOTE — Progress Notes (Signed)
Writer observed patient lying in bed resting and did not attend group this evening. Patient has been isolative to his room and minimal interaction and conversation with Clinical research associate. Patient voiced no complaints when writer asked how his day has been and informed him of hs medication change and he was fine with it. Patient is very guarded with poor eye contact. Patient reports passive si and verbally contracts and still has hi towards someone, denies auditory or visual hallucinations. Safety maintained on unit with 15 min checks.

## 2013-07-02 NOTE — [facility] Group Notes (Signed)
[facility] Group Notes: (Clinical Social Work)   07/02/2013      Type of Therapy:  Group Therapy   Participation Level:  Did Not Attend    Luv Mish Grossman-Orr, LCSW 07/02/2013, 4:23 PM     

## 2013-07-02 NOTE — Progress Notes (Signed)
Adult Psychoeducational Group Note  Date:  07/02/2013 Time:  8:00 pm  Group Topic/Focus:  Wrap-Up Group:   The focus of this group is to help patients review their daily goal of treatment and discuss progress on daily workbooks.  Participation Level:  Did Not Attend   Modena Nunnery 07/02/2013, 11:27 PM

## 2013-07-02 NOTE — [facility] Group Notes (Signed)
[facility] Group Notes:  (Nursing/MHT/Case Management/Adjunct)  Date:  07/02/2013  Time:  2:47 PM  Type of Therapy:  Nurse Education  Participation Level:  Minimal  Participation Quality:  Appropriate  Affect:  Appropriate  Cognitive:  Alert  Insight:  Appropriate  Engagement in Group:  Limited  Modes of Intervention:  Education  Summary of Progress/Problems:pt had limited conversation but has been attending groups  Nicole Cella 07/02/2013, 2:47 PM

## 2013-07-03 DIAGNOSIS — F332 Major depressive disorder, recurrent severe without psychotic features: Secondary | ICD-10-CM

## 2013-07-03 LAB — GLUCOSE, CAPILLARY: Glucose-Capillary: 127 mg/dL — ABNORMAL HIGH (ref 70–99)

## 2013-07-03 NOTE — Progress Notes (Signed)
Adult Psychoeducational Group Note  Date:  07/03/2013 Time:  08:00pm Group Topic/Focus:  Wrap-Up Group:   The focus of this group is to help patients review their daily goal of treatment and discuss progress on daily workbooks.  Participation Level:  Did Not Attend  Participation Quality:    Affect:    Cognitive:    Insight:   Engagement in Group:   Modes of Intervention:    Additional Comments:  Pt did not attend group  Shelly Bombard D 07/03/2013, 9:31 PM

## 2013-07-03 NOTE — Progress Notes (Signed)
D) Pt has spend a good part of the day sleeping. Does come out for meals, snacks and for his medications. Otherwise stays in his room. Affect is flat and Pt appears suspicious. Will answer questions only with the briefest answer. Poor eye contact. Did not fill out his self inventory. Pt verbally denies SI and HI. A) Given encouragement to come out of the room and participate with his peers. Attemtps made at engaging Pt in a 1:1 without success. R) Has been with drawn to his room much of the day.

## 2013-07-03 NOTE — Progress Notes (Signed)
[facility] MD Progress Note  07/03/2013 10:48 AM [name]  MRN:  [id number] Subjective:  Patient stated his sleep and appetite are poor, depression 10/10 with anxiety and suicidal ideations.  Appears to say exactly what he needs to say to stay, despite his actions and interactions on the units contraindicating his voiced depression and SI. Diagnosis:   DSM5:  Axis I: Major Depression, Recurrent severe and Schizoaffective Disorder Axis II: Deferred Axis III:  Past Medical History  Diagnosis Date  . Diabetes mellitus   . Bipolar affective disorder   . Schizophrenia, schizo-affective   . Hypertension   . Diabetes mellitus 03/11/2012  . Bipolar disorder 03/11/2012  . Anxiety   . Depression   . Hepatitis 06/30/2013    Type C   Axis IV: other psychosocial or environmental problems, problems related to social environment and problems with primary support group Axis V: 41-50 serious symptoms  ADL's:  Intact  Sleep: Poor  Appetite:  Poor  Suicidal Ideation:  Plan:  none Intent:  yes Means:  none Homicidal Ideation:  Denies   Psychiatric Specialty Exam: Review of Systems  Constitutional: Negative.   HENT: Negative.   Eyes: Negative.   Respiratory: Negative.   Cardiovascular: Negative.   Gastrointestinal: Negative.   Genitourinary: Negative.   Musculoskeletal: Negative.   Skin: Negative.   Neurological: Negative.   Endo/Heme/Allergies: Negative.     Blood pressure 132/89, pulse 85, temperature 97.6 F (36.4 C), temperature source Oral, resp. rate 16, height 5' 8.75" (1.746 m), weight 98.884 kg (218 lb).Body mass index is 32.44 kg/(m^2).  General Appearance: Casual  Eye Contact::  Fair  Speech:  Normal Rate  Volume:  Normal  Mood:  Depressed  Affect:  Congruent  Thought Process:  Coherent  Orientation:  Full (Time, Place, and Person)  Thought Content:  WDL  Suicidal Thoughts:  Yes.  with intent/plan  Homicidal Thoughts:  No  Memory:  Immediate;   Fair Recent;    Fair Remote;   Fair  Judgement:  Fair  Insight:  Fair  Psychomotor Activity:  Decreased  Concentration:  Fair  Recall:  Fair  Akathisia:  No  Handed:  Right  AIMS (if indicated):     Assets:  Physical Health Resilience  Sleep:  Number of Hours: 6.75   Current Medications: Current Facility-Administered Medications  Medication Dose Route Frequency Provider Last Rate Last Dose  . alum & mag hydroxide-simeth (MAALOX/MYLANTA) 200-200-20 MG/5ML suspension 30 mL  30 mL Oral Q4H PRN Kristeen Mans, NP      . DULoxetine (CYMBALTA) DR capsule 30 mg  30 mg Oral Daily Nanine Means, NP   30 mg at 07/03/13 0819  . ibuprofen (ADVIL,MOTRIN) tablet 800 mg  800 mg Oral Q6H PRN Nanine Means, NP      . insulin aspart (novoLOG) injection 0-15 Units  0-15 Units Subcutaneous TID WC Kristeen Mans, NP   5 Units at 07/03/13 [phone]  . insulin aspart (novoLOG) injection 4 Units  4 Units Subcutaneous TID WC Kristeen Mans, NP   4 Units at 07/02/13 1725  . insulin detemir (LEVEMIR) injection 40 Units  40 Units Subcutaneous QHS Kristeen Mans, NP   40 Units at 07/02/13 2215  . magnesium hydroxide (MILK OF MAGNESIA) suspension 30 mL  30 mL Oral Daily PRN Kristeen Mans, NP      . metFORMIN (GLUCOPHAGE) tablet 1,000 mg  1,000 mg Oral BID WC Kristeen Mans, NP   1,000 mg at 07/03/13 0819  .  QUEtiapine (SEROQUEL) tablet 100 mg  100 mg Oral QHS Nanine Means, NP   100 mg at 07/02/13 2215    Lab Results:  Results for orders placed during the hospital encounter of 06/30/13 (from the past 48 hour(s))  GLUCOSE, CAPILLARY     Status: Abnormal   Collection Time    07/01/13 11:37 AM      Result Value Range   Glucose-Capillary 243 (*) 70 - 99 mg/dL  GLUCOSE, CAPILLARY     Status: Abnormal   Collection Time    07/01/13  4:57 PM      Result Value Range   Glucose-Capillary 245 (*) 70 - 99 mg/dL   Comment 1 Notify RN    GLUCOSE, CAPILLARY     Status: Abnormal   Collection Time    07/01/13  6:45 PM      Result Value Range    Glucose-Capillary 252 (*) 70 - 99 mg/dL  GLUCOSE, CAPILLARY     Status: Abnormal   Collection Time    07/01/13  8:57 PM      Result Value Range   Glucose-Capillary 303 (*) 70 - 99 mg/dL   Comment 1 Documented in Chart    GLUCOSE, CAPILLARY     Status: Abnormal   Collection Time    07/02/13  6:25 AM      Result Value Range   Glucose-Capillary 192 (*) 70 - 99 mg/dL   Comment 1 Documented in Chart    GLUCOSE, CAPILLARY     Status: Abnormal   Collection Time    07/02/13 11:42 AM      Result Value Range   Glucose-Capillary 230 (*) 70 - 99 mg/dL   Comment 1 Notify RN    GLUCOSE, CAPILLARY     Status: Abnormal   Collection Time    07/02/13  5:06 PM      Result Value Range   Glucose-Capillary 216 (*) 70 - 99 mg/dL   Comment 1 Notify RN    GLUCOSE, CAPILLARY     Status: Abnormal   Collection Time    07/02/13  8:43 PM      Result Value Range   Glucose-Capillary 304 (*) 70 - 99 mg/dL  GLUCOSE, CAPILLARY     Status: Abnormal   Collection Time    07/03/13  6:22 AM      Result Value Range   Glucose-Capillary 208 (*) 70 - 99 mg/dL   Comment 1 Notify RN      Physical Findings: AIMS: Facial and Oral Movements Muscles of Facial Expression: None, normal Lips and Perioral Area: None, normal Jaw: None, normal Tongue: None, normal,Extremity Movements Upper (arms, wrists, hands, fingers): None, normal Lower (legs, knees, ankles, toes): None, normal, Trunk Movements Neck, shoulders, hips: None, normal, Overall Severity Severity of abnormal movements (highest score from questions above): None, normal Incapacitation due to abnormal movements: None, normal Patient's awareness of abnormal movements (rate only patient's report): No Awareness, Dental Status Current problems with teeth and/or dentures?: No Does patient usually wear dentures?: No  CIWA:    COWS:     Treatment Plan Summary: Daily contact with patient to assess and evaluate symptoms and progress in treatment Medication  management  Plan:  Review of chart, vital signs, medications, and notes. 1-Individual and group therapy 2-Medication management for depression and anxiety:  Medications reviewed with the patient and he stated no untoward effects, no changes made 3-Coping skills for depression, anxiety, and bereavement 4-Continue crisis stabilization and management 5-Address health issues--monitoring vital  signs, stable 6-Treatment plan in progress to prevent relapse of depression and anxiety  Medical Decision Making Problem Points:  Established problem, stable/improving (1) and Review of psycho-social stressors (1) Data Points:  Review of medication regiment & side effects (2)  I certify that inpatient services furnished can reasonably be expected to improve the patient's condition.   Nanine Means, PMH-NP 07/03/2013, 10:48 AM I agreed with findings and treatment plan of this patient

## 2013-07-03 NOTE — Progress Notes (Signed)
Psychoeducational Group Note  Date: 07/03/2013 Time: 1015  Group Topic/Focus:  Making Healthy Choices:   The focus of this group is to help patients identify negative/unhealthy choices they were using prior to admission and identify positive/healthier coping strategies to replace them upon discharge.  Participation Level:  Active  Participation Quality:  Appropriate  Affect:  Depressed  Cognitive:  Alert  Insight:  Engaged  Engagement in Group:  Engaged  Additional Comments:    07/03/2013,7:46 PM Giavonna Pflum, Joie Bimler

## 2013-07-03 NOTE — Progress Notes (Signed)
Psychoeducational Group Note  Date: 07/03/2013 Time:  0930  Group Topic/Focus:  Gratefulness:  The focus of this group is to help patients identify what two things they are most grateful for in their lives. What helps ground them and to center them on their work to their recovery.  Participation Level: Did not attend  Edmar Blankenburg A  

## 2013-07-03 NOTE — Progress Notes (Signed)
Writer has observed patient lying in bed resting and sleeping most of the evening, he did not attend group nor does he want to speak with writer and gives short, brief responses as if he does not care to be bothered. Patient is compliant with medications. Patient is isolative to his room. Patient remains safe with 15 min checks.

## 2013-07-03 NOTE — Progress Notes (Signed)
Adult Psychoeducational Group Note  Date:  07/03/2013 Time:  1:59 PM  Group Topic/Focus:  Therapeutic Activity  Participation Level:  Did Not Attend   Additional Comments:  Pts are encouraged to attend all group sessions. Pt stayed in bed to sleep.   Tora Perches N 07/03/2013, 1:59 PM

## 2013-07-03 NOTE — [facility] Group Notes (Signed)
[facility] Group Notes: (Clinical Social Work)   07/03/2013      Type of Therapy:  Group Therapy   Participation Level:  Did Not Attend    Ambrose Mantle, LCSW 07/03/2013, 4:34 PM

## 2013-07-04 DIAGNOSIS — F1210 Cannabis abuse, uncomplicated: Secondary | ICD-10-CM

## 2013-07-04 LAB — GLUCOSE, CAPILLARY
Glucose-Capillary: 185 mg/dL — ABNORMAL HIGH (ref 70–99)
Glucose-Capillary: 154 mg/dL — ABNORMAL HIGH (ref 70–99)

## 2013-07-04 MED ORDER — QUETIAPINE FUMARATE 200 MG PO TABS
200.0000 mg | ORAL_TABLET | Freq: Every day | ORAL | Status: DC
  Administered 2013-07-04: 200 mg via ORAL
  Filled 2013-07-04 (×2): qty 1

## 2013-07-04 MED ORDER — DULOXETINE HCL 20 MG PO CPEP
40.0000 mg | ORAL_CAPSULE | Freq: Every day | ORAL | Status: DC
  Administered 2013-07-05 – 2013-07-08 (×4): 40 mg via ORAL
  Filled 2013-07-04 (×3): qty 2
  Filled 2013-07-04: qty 28
  Filled 2013-07-04: qty 2
  Filled 2013-07-04: qty 28
  Filled 2013-07-04: qty 2

## 2013-07-04 NOTE — Clinical Social Work Note (Signed)
Let pt know [facility] is not an option as he does not have [facility].  Unconcerned by this news.  Overheard me talking to his roommate, and expressed interest in going to [facility] [place] or [place].  Plans to call them in pursuit of a bed.  His only question was how he would get to either place.  Told him I could not guarnatee it, burt could inquire about bus or train ticket.

## 2013-07-04 NOTE — [facility] Group Notes (Signed)
[facility] LCSW Group Therapy  07/04/2013 1:15 PM   Type of Therapy:  Group Therapy  Participation Level:  Did Not Attend - pt was in their room sleeping  Juvon Teater Horton, LCSW 07/04/2013 3:02 PM   

## 2013-07-04 NOTE — Progress Notes (Signed)
D: Pt is flat in affect and depressed in mood. Pt is positive for SI/HI/AVH. Pt contracts for safety. Pt reports hallucinations of blurred vision that is not medically r/t . Reports voices telling him to hurt himself. Pt did not attend group this evening.  A: Writer encouraged pt to go to group. Pt was informed of scheduled medication for this evening. Writer administered scheduled medications to pt. Continued support and availability as needed was extended to this pt. Staff continue to monitor pt with q35min checks.  R: No adverse drug reactions noted. Pt receptive to treatment. Pt remains safe at this time.

## 2013-07-04 NOTE — Progress Notes (Signed)
Patient ID: [name], male   DOB: 01-30-1961, 52 y.o.   MRN: [id number] PER STATE REGULATIONS 482.30  THIS CHART WAS REVIEWED FOR MEDICAL NECESSITY WITH RESPECT TO THE PATIENT'S ADMISSION/ DURATION OF STAY.  NEXT REVIEW DATE: 07/07/2013  Willa Rough, RN, BSN CASE MANAGER

## 2013-07-04 NOTE — Tx Team (Signed)
Interdisciplinary Treatment Plan Update (Adult)  Date: 07/04/2013  Time Reviewed:  9:45 AM  Progress in Treatment: Attending groups: Minimally Participating in groups:  No Taking medication as prescribed:  Yes Tolerating medication:  Yes Family/Significant othe contact made: CSW assessing  Patient understands diagnosis:  Yes  AEB asking for help with psychosis, depression, SI, HI Discussing patient identified problems/goals with staff:  Yes  See initial care plan Medical problems stabilized or resolved:  Yes Denies suicidal/homicidal ideation: No, but contracts for safety Issues/concerns per patient self-inventory:  Yes Other:  New problem(s) identified: N/A  Discharge Plan or Barriers: CSW assessing for appropriate referrals.  Reason for Continuation of Hospitalization: Anxiety Depression Medication Stabilization  Comments: N/A  Estimated length of stay: 3-5 days  For review of initial/current patient goals, please see plan of care.  [name] rated his depression, hopeless and anxiety all10's. Has experienced diarrhea and agitation in past 24 hours. SI, contracts for safety. Has experienced pain, dizziness, blurred vision in past 24 hours. Pain goal 10, worst pain 10. "Kill myself. Want to kill myself and others. Need to be on the right medications." Patient stated he is SI and HI to man who killed brother. No plan to hurt self and others here at [facility]. Voices to kill myself and others. Visions of shadows and others. 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   Attendees: Patient:     Family:     Physician:  Dr. Javier Glazier 07/04/2013 10:43 AM   Nursing:   Neill Loft, RN 07/04/2013 10:43 AM   Clinical Social Worker:  [name], LCSW 07/04/2013 10:43 AM   Other: Verne Spurr, PA 07/04/2013 10:43 AM   Other:  Elizbeth Squires, care coordination 07/04/2013 10:43 AM   Other:  Linton Rump, RN 07/04/2013 10:43 AM   Other:  Quintella Reichert, RN 07/04/2013 10:43 AM   Other:    Other:     Other:    Other:    Other:    Other:     Scribe for Treatment Team:   Carmina Miller, 07/04/2013 10:43 AM

## 2013-07-04 NOTE — Progress Notes (Signed)
Pt did not attend group on account of him sleeping. He was encouraged to come to group earlier in the evening by Clinical research associate.

## 2013-07-04 NOTE — Progress Notes (Signed)
Patient ID: [name], male   DOB: 1960/09/18, 52 y.o.   MRN: [id number] [facility] MD Progress Note  07/04/2013 2:47 PM Drystan Reader  MRN:  [id number]  Subjective:  Patient is seen and chart reviewed. Patient was found participating in group therapies and compliant with his medication. Patient reported he continued to be depressed, anxious and having suicidal thoughts. Patient reported his medication is making him jittery. Patient reported he has been disabled over one year and has been using cocaine marijuana and wants to be involved with this attention substance abuse treatment upon discharged from the inpatient hospitalization. Patient has distance of sleep and appetite, depression 10/10 with anxiety.    Diagnosis:   DSM5:  Axis I: Major Depression, Recurrent severe and Schizoaffective Disorder Axis II: Deferred Axis III:  Past Medical History  Diagnosis Date  . Diabetes mellitus   . Bipolar affective disorder   . Schizophrenia, schizo-affective   . Hypertension   . Diabetes mellitus 03/11/2012  . Bipolar disorder 03/11/2012  . Anxiety   . Depression   . Hepatitis 06/30/2013    Type C   Axis IV: other psychosocial or environmental problems, problems related to social environment and problems with primary support group Axis V: 41-50 serious symptoms  ADL's:  Intact  Sleep: Poor  Appetite:  Poor  Suicidal Ideation:  Plan:  none Intent:  yes Means:  none Homicidal Ideation:  Denies   Psychiatric Specialty Exam: Review of Systems  Constitutional: Negative.   HENT: Negative.   Eyes: Negative.   Respiratory: Negative.   Cardiovascular: Negative.   Gastrointestinal: Negative.   Genitourinary: Negative.   Musculoskeletal: Negative.   Skin: Negative.   Neurological: Negative.   Endo/Heme/Allergies: Negative.     Blood pressure 138/90, pulse 75, temperature 98 F (36.7 C), temperature source Oral, resp. rate 16, height 5' 8.75" (1.746 m), weight 98.884 kg (218 lb).Body  mass index is 32.44 kg/(m^2).  General Appearance: Casual  Eye Contact::  Fair  Speech:  Normal Rate  Volume:  Normal  Mood:  Depressed  Affect:  Congruent  Thought Process:  Coherent  Orientation:  Full (Time, Place, and Person)  Thought Content:  WDL  Suicidal Thoughts:  Yes.  with intent/plan  Homicidal Thoughts:  No  Memory:  Immediate;   Fair Recent;   Fair Remote;   Fair  Judgement:  Fair  Insight:  Fair  Psychomotor Activity:  Decreased  Concentration:  Fair  Recall:  Fair  Akathisia:  No  Handed:  Right  AIMS (if indicated):     Assets:  Physical Health Resilience  Sleep:  Number of Hours: 6.75   Current Medications: Current Facility-Administered Medications  Medication Dose Route Frequency Provider Last Rate Last Dose  . alum & mag hydroxide-simeth (MAALOX/MYLANTA) 200-200-20 MG/5ML suspension 30 mL  30 mL Oral Q4H PRN Kristeen Mans, NP      . DULoxetine (CYMBALTA) DR capsule 30 mg  30 mg Oral Daily Nanine Means, NP   30 mg at 07/04/13 0834  . ibuprofen (ADVIL,MOTRIN) tablet 800 mg  800 mg Oral Q6H PRN Nanine Means, NP   800 mg at 07/04/13 0841  . insulin aspart (novoLOG) injection 0-15 Units  0-15 Units Subcutaneous TID WC Kristeen Mans, NP   5 Units at 07/04/13 1158  . insulin aspart (novoLOG) injection 4 Units  4 Units Subcutaneous TID WC Kristeen Mans, NP   4 Units at 07/04/13 1159  . insulin detemir (LEVEMIR) injection 40 Units  40 Units  Subcutaneous QHS Kristeen Mans, NP   40 Units at 07/03/13 2137  . magnesium hydroxide (MILK OF MAGNESIA) suspension 30 mL  30 mL Oral Daily PRN Kristeen Mans, NP      . metFORMIN (GLUCOPHAGE) tablet 1,000 mg  1,000 mg Oral BID WC Kristeen Mans, NP   1,000 mg at 07/04/13 0834  . QUEtiapine (SEROQUEL) tablet 100 mg  100 mg Oral QHS Nanine Means, NP   100 mg at 07/03/13 2137    Lab Results:  Results for orders placed during the hospital encounter of 06/30/13 (from the past 48 hour(s))  GLUCOSE, CAPILLARY     Status: Abnormal    Collection Time    07/02/13  5:06 PM      Result Value Range   Glucose-Capillary 216 (*) 70 - 99 mg/dL   Comment 1 Notify RN    GLUCOSE, CAPILLARY     Status: Abnormal   Collection Time    07/02/13  8:43 PM      Result Value Range   Glucose-Capillary 304 (*) 70 - 99 mg/dL  GLUCOSE, CAPILLARY     Status: Abnormal   Collection Time    07/03/13  6:22 AM      Result Value Range   Glucose-Capillary 208 (*) 70 - 99 mg/dL   Comment 1 Notify RN    GLUCOSE, CAPILLARY     Status: Abnormal   Collection Time    07/03/13 11:27 AM      Result Value Range   Glucose-Capillary 127 (*) 70 - 99 mg/dL  GLUCOSE, CAPILLARY     Status: Abnormal   Collection Time    07/03/13  5:12 PM      Result Value Range   Glucose-Capillary 166 (*) 70 - 99 mg/dL   Comment 1 Notify RN    GLUCOSE, CAPILLARY     Status: Abnormal   Collection Time    07/03/13  8:52 PM      Result Value Range   Glucose-Capillary 178 (*) 70 - 99 mg/dL  GLUCOSE, CAPILLARY     Status: Abnormal   Collection Time    07/04/13  6:10 AM      Result Value Range   Glucose-Capillary 185 (*) 70 - 99 mg/dL  GLUCOSE, CAPILLARY     Status: Abnormal   Collection Time    07/04/13 11:50 AM      Result Value Range   Glucose-Capillary 138 (*) 70 - 99 mg/dL   Comment 1 Notify RN      Physical Findings: AIMS: Facial and Oral Movements Muscles of Facial Expression: None, normal Lips and Perioral Area: None, normal Jaw: None, normal Tongue: None, normal,Extremity Movements Upper (arms, wrists, hands, fingers): None, normal Lower (legs, knees, ankles, toes): None, normal, Trunk Movements Neck, shoulders, hips: None, normal, Overall Severity Severity of abnormal movements (highest score from questions above): None, normal Incapacitation due to abnormal movements: None, normal Patient's awareness of abnormal movements (rate only patient's report): No Awareness, Dental Status Current problems with teeth and/or dentures?: No Does patient usually  wear dentures?: No  CIWA:  CIWA-Ar Total: 6 COWS:  COWS Total Score: 2  Treatment Plan Summary: Daily contact with patient to assess and evaluate symptoms and progress in treatment Medication management  Plan:  Review of chart, vital signs, medications, and notes. 1-Individual and group therapy 2-Medication management for depression and anxiety:  Medications reviewed with the patient and he stated no untoward effects,  Increase Cymbalta 40 mg per day for  better symptom control and increase Seroquel to 200 mg at bedtime  3-Coping skills for depression, anxiety, and bereavement 4-Continue crisis stabilization and management 5-Address health issues--monitoring vital signs, stable 6-Treatment plan in progress to prevent relapse of depression and anxiety  Medical Decision Making Problem Points:  Established problem, stable/improving (1) and Review of psycho-social stressors (1) Data Points:  Review of medication regiment & side effects (2)  I certify that inpatient services furnished can reasonably be expected to improve the patient's condition.   Nehemiah Settle., M.D.  07/04/2013, 2:47 PM
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
## 2013-07-04 NOTE — Progress Notes (Signed)
Adult Psychoeducational Group Note  Date:  07/04/2013 Time:  11:00 AM  Group Topic/Focus:  Dimensions of Wellness:   The focus of this group is to introduce the topic of wellness and discuss the role each dimension of wellness plays in total health.  Participation Level:  Minimal  Participation Quality:  Appropriate  Affect:  Blunted  Cognitive:  Alert  Insight: Limited  Engagement in Group:  Limited  Modes of Intervention:  Education  Additional Comments: Patient attended group but did not participate in the activity.  Merleen Milliner 07/04/2013, 3:21 PM

## 2013-07-04 NOTE — Progress Notes (Signed)
Recreation Therapy Notes  Date: 12.08.2014 Time: 3:00pm Location: [address]  Group Topic: Wellness  Goal Area(s) Addresses:  Patient will define components of whole wellness. Patient will verbalize benefit of whole wellness.  Behavioral Response: Did not attend.   Marykay Lex Shanikka Wonders, LRT/CTRS  Birdena Kingma L 07/04/2013 4:12 PM

## 2013-07-05 LAB — GLUCOSE, CAPILLARY: Glucose-Capillary: 166 mg/dL — ABNORMAL HIGH (ref 70–99)

## 2013-07-05 MED ORDER — QUETIAPINE FUMARATE 300 MG PO TABS
300.0000 mg | ORAL_TABLET | Freq: Every day | ORAL | Status: DC
  Administered 2013-07-05 – 2013-07-07 (×3): 300 mg via ORAL
  Filled 2013-07-05: qty 1
  Filled 2013-07-05: qty 14
  Filled 2013-07-05: qty 1
  Filled 2013-07-05: qty 14
  Filled 2013-07-05 (×2): qty 1

## 2013-07-05 NOTE — Progress Notes (Signed)
Recreation Therapy Notes  Animal-Assisted Activity/Therapy (AAA/T) Program Checklist/Progress Notes Patient Eligibility Criteria Checklist & Daily Group note for Rec Tx Intervention  Date: 12.09.2014 Time: 2:45pm Location: [address]    AAA/T Program Assumption of Risk Form signed by Patient/ or Parent Legal Guardian yes  Patient is free of allergies or sever asthma  yes  Patient reports no fear of animals yes  Patient reports no history of cruelty to animals yes   Patient understands his/her participation is voluntary yes.  Patient washes hands before animal contact yes  Patient washes hands after animal contact yes  Behavioral Response: Did not attend.   Marykay Lex Janese Radabaugh, LRT/CTRS  Deagen Krass L 07/05/2013 4:12 PM

## 2013-07-05 NOTE — Clinical Social Work Note (Addendum)
CSW met with pt individually at this time.  CSW explained that pt wouldn't be able to go to [facility] due to his insurance being out of a different county, other than [place].  Pt is interested in going straight to long term treatment from here.  Pt was interested in [facility].  CSW will refer pt to [facility] today and monitor this referral.  CSW also requested an outfit from the clothes closet.  CSW will provide pt with this today.  No further needs voiced by pt at this time.    [name], LCSW 07/05/2013  10:40 AM
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
## 2013-07-05 NOTE — Progress Notes (Signed)
The focus of this group is to educate the patient on the purpose and policies of crisis stabilization and provide a format to answer questions about their admission.  The group details unit policies and expectations of patients while admitted. Patient attended group but forwarded little information.

## 2013-07-05 NOTE — Progress Notes (Addendum)
D:  Patient's self inventory sheet, patient has poor sleep, poor appetite, low energy level, poor attention.  Rated depression, hopeless and anxiety 10.  Has experienced diarrhea and agitation in past 24 hours.  SI, contracts for safety.  Has experienced lightheadedness, pain, dizziness, blurred vision in past 24 hours.  Pain goal 10, worst pain 0.  Would like to "stop thinking about killing myself."  No discharge plans.  Needs financial assistance to purchase medications after discharge. A:  Medications administered per MD orders.  Emotional support and encouragement given patient. R:  SI and HI off/on, contracts for safety.  Voices to hurt himself, continues to see shadows.  Will continue to monitor patient for safety with 15 minute checks.  Safety maintained. Patient did not get out of bed and go to breakfast this morning.  Insulin given after patient did get out of bed and eat.  CBG 166 this morning, novolog 7 units sq  given per MD order.  1200 CBG 177, novolog 7 units sq given per MD order.

## 2013-07-05 NOTE — [facility] Group Notes (Signed)
[facility] LCSW Group Therapy  07/05/2013 1:15 PM   Type of Therapy:  Group Therapy  Participation Level:  Did Not Attend - pt states that his stomach hurts  [name], LCSW 07/05/2013 3:19 PM

## 2013-07-05 NOTE — Progress Notes (Signed)
Adult Psychoeducational Group Note  Date:  07/05/2013 Time:  6:30 PM  Group Topic/Focus:  Recovery Goals:   The focus of this group is to identify appropriate goals for recovery and establish a plan to achieve them.  Participation Level:  Minimal  Participation Quality:  Appropriate  Affect:  Appropriate  Cognitive:  Appropriate  Insight: Good  Engagement in Group:  Engaged and Improving  Modes of Intervention:  Discussion  Additional Comments:  Pt attended recovery group this morning. Pt discussed appropriate goals for recovery and coping skills to use once discharge.  Aasir Daigler A 07/05/2013, 6:30 PM

## 2013-07-05 NOTE — [facility] Suicide Risk Assessment (Signed)
[facility] Adult Inpatient Family/Significant Other Suicide Prevention Education  Suicide Prevention Education:   Patient Refusal for Family/Significant Other Suicide Prevention Education: The patient has refused to provide written consent for family/significant other to be provided Family/Significant Other Suicide Prevention Education during admission and/or prior to discharge.  Physician notified.  CSW provided suicide prevention information with patient.    The suicide prevention education provided includes the following:  Suicide risk factors  Suicide prevention and interventions  National Suicide Hotline telephone number  [facility] assessment telephone number  [facility] Emergency Assistance 911  [facility] and/or Residential Mobile Crisis Unit telephone number   [name], [place] 07/05/2013 10:18 AM

## 2013-07-06 LAB — GLUCOSE, CAPILLARY
Glucose-Capillary: 177 mg/dL — ABNORMAL HIGH (ref 70–99)
Glucose-Capillary: 186 mg/dL — ABNORMAL HIGH (ref 70–99)
Glucose-Capillary: 294 mg/dL — ABNORMAL HIGH (ref 70–99)
Glucose-Capillary: 208 mg/dL — ABNORMAL HIGH (ref 70–99)
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
## 2013-07-06 NOTE — Progress Notes (Signed)
Did not attend Group 

## 2013-07-06 NOTE — Progress Notes (Signed)
Recreation Therapy Notes  Date: 12.10.2014 Time: 3:00pm Location: [address]  Group Topic: Leisure Education  Goal Area(s) Addresses:  Patient will identify positive leisure activities.  Patient will identify one positive benefit of participation in leisure activities.   Behavioral Response:  Did not attend.  Marykay Lex [place], LRT/CTRS  Rudean Icenhour L 07/06/2013 3:59 PM

## 2013-07-06 NOTE — Clinical Social Work Note (Signed)
Writer met with patient who was agreeable to follow up with [facility] in [place].  He asked for phone number to call.  He advised Clinical research associate they have a two week waiting list.  He shared he will go to [facility] to live at discharge.

## 2013-07-06 NOTE — Progress Notes (Signed)
D: Pt has passive SI, but contracts for safety Pt denies HI/AVH. Pt is pleasant and cooperative. Pt lying in bed most of the night, pt did come to med window to medications.   A: Pt was offered support and encouragement. Pt was given scheduled medications. Pt was encourage to attend groups. Q 15 minute checks were done for safety.   R: Pt is taking medication. Pt has no complaints at this time.Pt receptive to treatment and safety maintained on unit.

## 2013-07-06 NOTE — Progress Notes (Signed)
Patient ID: [name], male   DOB: February 12, 1961, 52 y.o.   MRN: [id number]   D: Patient presents with anxious affect and depressed mood. Patient denies A/V hallucinations. Patient is passive for SI but contracts for safety and is also positive for HI. Writer inquired about patient homicidal ideation and whom it was directed toward. Patient stated, "I don't wanna get into all that," and walked away. Patient complains of pain in his ear at a 10/10 but refuses any medication or intervention.   A: Writer gave encouragement and support to patient but patient forwards very little.  R: Patient is minimal with Clinical research associate. Patient is seen periodically in the milieu. Q15 minute safety checks are maintained.

## 2013-07-06 NOTE — Progress Notes (Signed)
Patient ID: [name], male   DOB: 1961/05/21, 52 y.o.   MRN: [id number] [facility] MD Progress Note  07/06/2013 2:22 PM [name]  MRN:  [id number]  Subjective:  Patient complains feeling anxious and jittery since he has been thinking about his brother who was monitored in [place] not done him a month and half ago. Patient reported has been compliant with his medication and has no reported adverse effects. Patient reported medication is working fine and has been a participating in unit therapeutic activities and Engineer, maintenance (IT). Patient has a disturbance of sleep and ongoing anxiety. Patient rates depression 8/10 with passive suicidal ideations and some homicidal ideations but much less--only against the person who killed his brother.  Diagnosis:   DSM5:  Depressive Disorders:  Major Depressive Disorder - Severe (296.23)  Axis I: Major Depression, Recurrent severe, Schizoaffective Disorder and Substance Abuse Axis II: Deferred Axis III:  Past Medical History  Diagnosis Date  . Diabetes mellitus   . Bipolar affective disorder   . Schizophrenia, schizo-affective   . Hypertension   . Diabetes mellitus 03/11/2012  . Bipolar disorder 03/11/2012  . Anxiety   . Depression   . Hepatitis 06/30/2013    Type C   Axis IV: economic problems, housing problems, other psychosocial or environmental problems, problems related to social environment and problems with primary support group Axis V: 41-50 serious symptoms  ADL's:  Intact  Sleep: Poor  Appetite:  Poor  Suicidal Ideation:  Plan:  vague Intent:  none Means:  none Homicidal Ideation:  Denies  Psychiatric Specialty Exam: Review of Systems  Constitutional: Negative.   HENT: Negative.   Eyes: Negative.   Respiratory: Negative.   Cardiovascular: Negative.   Gastrointestinal: Negative.   Genitourinary: Negative.   Musculoskeletal: Negative.   Skin: Negative.   Neurological: Negative.   Endo/Heme/Allergies: Negative.    Psychiatric/Behavioral: Positive for depression, suicidal ideas and substance abuse.    Blood pressure 128/79, pulse 71, temperature 97.4 F (36.3 C), temperature source Oral, resp. rate 16, height 5' 8.75" (1.746 m), weight 98.884 kg (218 lb).Body mass index is 32.44 kg/(m^2).  General Appearance: Casual  Eye Contact::  Fair  Speech:  Normal Rate  Volume:  Normal  Mood:  Depressed  Affect:  Congruent  Thought Process:  Coherent  Orientation:  Full (Time, Place, and Person)  Thought Content:  WDL  Suicidal Thoughts:  Yes.  without intent/plan  Homicidal Thoughts:  Yes.  without intent/plan  Memory:  Immediate;   Fair Recent;   Fair Remote;   Fair  Judgement:  Fair  Insight:  Fair  Psychomotor Activity:  Decreased  Concentration:  Fair  Recall:  Fair  Akathisia:  No  Handed:  Right  AIMS (if indicated):     Assets:  Resilience  Sleep:  Number of Hours: 6.5   Current Medications: Current Facility-Administered Medications  Medication Dose Route Frequency Provider Last Rate Last Dose  . alum & mag hydroxide-simeth (MAALOX/MYLANTA) 200-200-20 MG/5ML suspension 30 mL  30 mL Oral Q4H PRN Kristeen Mans, NP      . DULoxetine (CYMBALTA) DR capsule 40 mg  40 mg Oral Daily Nehemiah Settle, MD   40 mg at 07/06/13 0853  . ibuprofen (ADVIL,MOTRIN) tablet 800 mg  800 mg Oral Q6H PRN Nanine Means, NP   800 mg at 07/04/13 0841  . insulin aspart (novoLOG) injection 0-15 Units  0-15 Units Subcutaneous TID WC Kristeen Mans, NP   8 Units at 07/06/13 1202  .  insulin aspart (novoLOG) injection 4 Units  4 Units Subcutaneous TID WC Kristeen Mans, NP   4 Units at 07/06/13 1201  . insulin detemir (LEVEMIR) injection 40 Units  40 Units Subcutaneous QHS Kristeen Mans, NP   40 Units at 07/05/13 2211  . magnesium hydroxide (MILK OF MAGNESIA) suspension 30 mL  30 mL Oral Daily PRN Kristeen Mans, NP      . metFORMIN (GLUCOPHAGE) tablet 1,000 mg  1,000 mg Oral BID WC Kristeen Mans, NP   1,000 mg at  07/06/13 0853  . QUEtiapine (SEROQUEL) tablet 300 mg  300 mg Oral QHS Nanine Means, NP   300 mg at 07/05/13 2212    Lab Results:  Results for orders placed during the hospital encounter of 06/30/13 (from the past 48 hour(s))  GLUCOSE, CAPILLARY     Status: Abnormal   Collection Time    07/04/13  4:39 PM      Result Value Range   Glucose-Capillary 154 (*) 70 - 99 mg/dL   Comment 1 Notify RN    GLUCOSE, CAPILLARY     Status: Abnormal   Collection Time    07/04/13  8:51 PM      Result Value Range   Glucose-Capillary 202 (*) 70 - 99 mg/dL   Comment 1 Notify RN    GLUCOSE, CAPILLARY     Status: Abnormal   Collection Time    07/05/13  6:31 AM      Result Value Range   Glucose-Capillary 166 (*) 70 - 99 mg/dL   Comment 1 Notify RN    GLUCOSE, CAPILLARY     Status: Abnormal   Collection Time    07/05/13 11:36 AM      Result Value Range   Glucose-Capillary 177 (*) 70 - 99 mg/dL   Comment 1 Notify RN    GLUCOSE, CAPILLARY     Status: Abnormal   Collection Time    07/05/13  5:20 PM      Result Value Range   Glucose-Capillary 173 (*) 70 - 99 mg/dL  GLUCOSE, CAPILLARY     Status: Abnormal   Collection Time    07/05/13  8:48 PM      Result Value Range   Glucose-Capillary 204 (*) 70 - 99 mg/dL  GLUCOSE, CAPILLARY     Status: Abnormal   Collection Time    07/06/13  6:20 AM      Result Value Range   Glucose-Capillary 186 (*) 70 - 99 mg/dL  GLUCOSE, CAPILLARY     Status: Abnormal   Collection Time    07/06/13 11:46 AM      Result Value Range   Glucose-Capillary 294 (*) 70 - 99 mg/dL   Comment 1 Notify RN      Physical Findings: AIMS: Facial and Oral Movements Muscles of Facial Expression: None, normal Lips and Perioral Area: None, normal Jaw: None, normal Tongue: None, normal,Extremity Movements Upper (arms, wrists, hands, fingers): None, normal Lower (legs, knees, ankles, toes): None, normal, Trunk Movements Neck, shoulders, hips: None, normal, Overall Severity Severity of  abnormal movements (highest score from questions above): None, normal Incapacitation due to abnormal movements: None, normal Patient's awareness of abnormal movements (rate only patient's report): No Awareness, Dental Status Current problems with teeth and/or dentures?: No Does patient usually wear dentures?: No  CIWA:  CIWA-Ar Total: 4 COWS:  COWS Total Score: 3  Treatment Plan Summary: Daily contact with patient to assess and evaluate symptoms and progress in treatment Medication  management  Plan:  Review of chart, vital signs, medications, and notes. 1-Individual and group therapy 2-Medication management for depression and anxiety:  Medications reviewed with the patient and his Seroquel increased to help his sleep issues 3-Coping skills for depression, anxiety, and grief 4-Continue crisis stabilization and management 5-Address health issues--monitoring vital signs, stable 6-Treatment plan in progress to prevent relapse of depression and anxiety  Medical Decision Making Problem Points:  Established problem, stable/improving (1) and Review of psycho-social stressors (1) Data Points:  Review of medication regiment & side effects (2)  I certify that inpatient services furnished can reasonably be expected to improve the patient's condition.    Nehemiah Settle., MD 07/06/2013 2:22 PM

## 2013-07-06 NOTE — [facility] Group Notes (Signed)
[facility] LCSW Aftercare Discharge Planning Group Note   07/06/2013 3:08 PM    Participation Quality:  Appropraite  Mood/Affect:  Appropriate  Depression Rating:  10  Anxiety Rating:  10  Thoughts of Suicide: Yes  Will you contract for safety?   Yes  Current AVH:  No  Plan for Discharge/Comments:  Patient attended discharge planning group and actively participated in group.  He advised he wants to get into a residential program but was not accepted at [facility].  Patient is considering other options. CSW provided all participants with daily workbook.   Transportation Means: Patient has transportation.   Supports:  Patient has a support system.   Vee Bahe, Joesph July

## 2013-07-06 NOTE — Progress Notes (Signed)
Adult Psychoeducational Group Note  Date:  07/05/2013 Time:  8:00 pm  Group Topic/Focus:  Wrap-Up Group:   The focus of this group is to help patients review their daily goal of treatment and discuss progress on daily workbooks.  Participation Level:  Did Not Attend  Modena Nunnery 07/06/2013, 12:14 AM

## 2013-07-07 LAB — GLUCOSE, CAPILLARY
Glucose-Capillary: 204 mg/dL — ABNORMAL HIGH (ref 70–99)
Glucose-Capillary: 222 mg/dL — ABNORMAL HIGH (ref 70–99)

## 2013-07-07 MED ORDER — DULOXETINE HCL 20 MG PO CPEP: 40.0000 mg | ORAL_CAPSULE | Freq: Every day | ORAL | Status: DC

## 2013-07-07 MED ORDER — METFORMIN HCL 1000 MG PO TABS: 1000.0000 mg | ORAL_TABLET | Freq: Two times a day (BID) | ORAL | Status: DC

## 2013-07-07 MED ORDER — INSULIN ASPART 100 UNIT/ML FLEXPEN: 3.0000 [IU] | PEN_INJECTOR | Freq: Three times a day (TID) | SUBCUTANEOUS | Status: DC

## 2013-07-07 MED ORDER — INSULIN DETEMIR 100 UNIT/ML ~~LOC~~ SOLN: 40.0000 [IU] | Freq: Every day | SUBCUTANEOUS | Status: DC

## 2013-07-07 MED ORDER — QUETIAPINE FUMARATE 300 MG PO TABS: 300.0000 mg | ORAL_TABLET | Freq: Every day | ORAL | Status: DC

## 2013-07-07 NOTE — Clinical Social Work Note (Signed)
Call from [facility] asking for additional clinics as they are considering patient for admission today.  Clinicals faxed and Melissa advised patient accepted for admission today.  Patient and MD advised.

## 2013-07-07 NOTE — Progress Notes (Signed)
[facility] MD Progress Note  07/07/2013 2:23 PM [name]  MRN:  [id number]  Subjective:  Patient complains feeling anxious and jittery since he has been thinking about his brother who was monitored in [place] not done him a month and half ago. Patient reported has been compliant with his medication and has no reported adverse effects. Patient reported medication is working fine and has been a participating in unit therapeutic activities and Engineer, maintenance (IT). Patient has a disturbance of sleep and ongoing anxiety. Patient rates depression 8/10 with passive suicidal ideations and some homicidal ideations but much less--only against the person who killed his brother.  Diagnosis:   DSM5:  Depressive Disorders:  Major Depressive Disorder - Severe (296.23)  Axis I: Major Depression, Recurrent severe, Schizoaffective Disorder and Substance Abuse Axis II: Deferred Axis III:  Past Medical History  Diagnosis Date  . Diabetes mellitus   . Bipolar affective disorder   . Schizophrenia, schizo-affective   . Hypertension   . Diabetes mellitus 03/11/2012  . Bipolar disorder 03/11/2012  . Anxiety   . Depression   . Hepatitis 06/30/2013    Type C   Axis IV: economic problems, housing problems, other psychosocial or environmental problems, problems related to social environment and problems with primary support group Axis V: 41-50 serious symptoms  ADL's:  Intact  Sleep: Poor  Appetite:  Poor  Suicidal Ideation:  Plan:  vague Intent:  none Means:  none Homicidal Ideation:  Denies  Psychiatric Specialty Exam: Review of Systems  Constitutional: Negative.   HENT: Negative.   Eyes: Negative.   Respiratory: Negative.   Cardiovascular: Negative.   Gastrointestinal: Negative.   Genitourinary: Negative.   Musculoskeletal: Negative.   Skin: Negative.   Neurological: Negative.   Endo/Heme/Allergies: Negative.   Psychiatric/Behavioral: Positive for depression, suicidal ideas and  substance abuse.    Blood pressure 119/84, pulse 90, temperature 97.8 F (36.6 C), temperature source Oral, resp. rate 15, height 5' 8.75" (1.746 m), weight 98.884 kg (218 lb).Body mass index is 32.44 kg/(m^2).  General Appearance: Casual  Eye Contact::  Fair  Speech:  Normal Rate  Volume:  Normal  Mood:  Depressed  Affect:  Congruent  Thought Process:  Coherent  Orientation:  Full (Time, Place, and Person)  Thought Content:  WDL  Suicidal Thoughts:  Yes.  without intent/plan  Homicidal Thoughts:  Yes.  without intent/plan  Memory:  Immediate;   Fair Recent;   Fair Remote;   Fair  Judgement:  Fair  Insight:  Fair  Psychomotor Activity:  Decreased  Concentration:  Fair  Recall:  Fair  Akathisia:  No  Handed:  Right  AIMS (if indicated):     Assets:  Resilience  Sleep:  Number of Hours: 6.75   Current Medications: Current Facility-Administered Medications  Medication Dose Route Frequency Provider Last Rate Last Dose  . alum & mag hydroxide-simeth (MAALOX/MYLANTA) 200-200-20 MG/5ML suspension 30 mL  30 mL Oral Q4H PRN Kristeen Mans, NP      . DULoxetine (CYMBALTA) DR capsule 40 mg  40 mg Oral Daily Nehemiah Settle, MD   40 mg at 07/07/13 0825  . ibuprofen (ADVIL,MOTRIN) tablet 800 mg  800 mg Oral Q6H PRN Nanine Means, NP   800 mg at 07/04/13 0841  . insulin aspart (novoLOG) injection 0-15 Units  0-15 Units Subcutaneous TID WC Kristeen Mans, NP   5 Units at 07/07/13 1156  . insulin aspart (novoLOG) injection 4 Units  4 Units Subcutaneous TID WC Drenda Freeze E  Link Snuffer, NP   4 Units at 07/07/13 1155  . insulin detemir (LEVEMIR) injection 40 Units  40 Units Subcutaneous QHS Kristeen Mans, NP   40 Units at 07/06/13 2204  . magnesium hydroxide (MILK OF MAGNESIA) suspension 30 mL  30 mL Oral Daily PRN Kristeen Mans, NP      . metFORMIN (GLUCOPHAGE) tablet 1,000 mg  1,000 mg Oral BID WC Kristeen Mans, NP   1,000 mg at 07/07/13 0825  . QUEtiapine (SEROQUEL) tablet 300 mg  300 mg Oral QHS  Nanine Means, NP   300 mg at 07/06/13 2200    Lab Results:  Results for orders placed during the hospital encounter of 06/30/13 (from the past 48 hour(s))  GLUCOSE, CAPILLARY     Status: Abnormal   Collection Time    07/05/13  5:20 PM      Result Value Range   Glucose-Capillary 173 (*) 70 - 99 mg/dL  GLUCOSE, CAPILLARY     Status: Abnormal   Collection Time    07/05/13  8:48 PM      Result Value Range   Glucose-Capillary 204 (*) 70 - 99 mg/dL  GLUCOSE, CAPILLARY     Status: Abnormal   Collection Time    07/06/13  6:20 AM      Result Value Range   Glucose-Capillary 186 (*) 70 - 99 mg/dL  GLUCOSE, CAPILLARY     Status: Abnormal   Collection Time    07/06/13 11:46 AM      Result Value Range   Glucose-Capillary 294 (*) 70 - 99 mg/dL   Comment 1 Notify RN    GLUCOSE, CAPILLARY     Status: Abnormal   Collection Time    07/06/13  5:20 PM      Result Value Range   Glucose-Capillary 177 (*) 70 - 99 mg/dL   Comment 1 Notify RN    GLUCOSE, CAPILLARY     Status: Abnormal   Collection Time    07/06/13  8:46 PM      Result Value Range   Glucose-Capillary 208 (*) 70 - 99 mg/dL   Comment 1 Notify RN    GLUCOSE, CAPILLARY     Status: Abnormal   Collection Time    07/07/13  6:04 AM      Result Value Range   Glucose-Capillary 204 (*) 70 - 99 mg/dL  GLUCOSE, CAPILLARY     Status: Abnormal   Collection Time    07/07/13 11:51 AM      Result Value Range   Glucose-Capillary 222 (*) 70 - 99 mg/dL    Physical Findings: AIMS: Facial and Oral Movements Muscles of Facial Expression: None, normal Lips and Perioral Area: None, normal Jaw: None, normal Tongue: None, normal,Extremity Movements Upper (arms, wrists, hands, fingers): None, normal Lower (legs, knees, ankles, toes): None, normal, Trunk Movements Neck, shoulders, hips: None, normal, Overall Severity Severity of abnormal movements (highest score from questions above): None, normal Incapacitation due to abnormal movements: None,  normal Patient's awareness of abnormal movements (rate only patient's report): No Awareness, Dental Status Current problems with teeth and/or dentures?: No Does patient usually wear dentures?: No  CIWA:  CIWA-Ar Total: 4 COWS:  COWS Total Score: 3  Treatment Plan Summary: Daily contact with patient to assess and evaluate symptoms and progress in treatment Medication management  Plan:  Review of chart, vital signs, medications, and notes. 1-Individual and group therapy 2-Medication management for depression and anxiety:   Medications reviewed with the patient and  his Seroquel 300 mg at bedtime and Cymbalta 40 mg a day time  3-Coping skills for depression, anxiety, and grief 4-Continue crisis stabilization and management 5-Address health issues--monitoring vital signs, stable 6-Treatment plan in progress to prevent relapse of depression and anxiety 7. Disposition plans are in progress and may be discharged tomorrow to [facility] if he can contract for safety and continue to show clinical improvement.  Medical Decision Making Problem Points:  Established problem, stable/improving (1) and Review of psycho-social stressors (1) Data Points:  Review of medication regiment & side effects (2)  I certify that inpatient services furnished can reasonably be expected to improve the patient's condition.    Nehemiah Settle., MD 07/07/2013 2:23 PM

## 2013-07-07 NOTE — Progress Notes (Signed)
Adult Psychoeducational Group Note  Date:  07/07/2013 Time:  2:13 PM  Group Topic/Focus:  Self Esteem Action Plan:   The focus of this group is to help patients create a plan to continue to build self-esteem after discharge.  Participation Level:  Did Not Attend  Additional Comments:   [name] 07/07/2013, 2:13 PM

## 2013-07-07 NOTE — Progress Notes (Signed)
Patient ID: [name], male   DOB: June 15, 1961, 52 y.o.   MRN: [id number]  [facility] 9:00 AM  The focus of this group is to educate the patient on the purpose and policies of crisis stabilization and provide a format to answer questions about their admission.  The group details unit policies and expectations of patients while admitted.  Patient did not attend.

## 2013-07-07 NOTE — Progress Notes (Signed)
Didn't attend group 

## 2013-07-07 NOTE — Progress Notes (Signed)
[facility] Adult Case Management Discharge Plan :  Will you be returning to the same living situation after discharge: No. At discharge, do you have transportation home?:Yes,  [facility] to transport patient to facility Do you have the ability to pay for your medications:Yes,  Patient needs assistance with medications.  Release of information consent forms completed and in the chart;  Patient's signature needed at discharge.  Patient to Follow up at: Follow-up Information   Follow up with [facility] On 07/07/2013. (Today at 3:00 PM.  [facility] will pick patient up for transport)    Contact information:   [address] [place], [place]  [address]  [phone]      Patient denies SI/HI:   Patient no longer endorsing SI/HI or other thoughts of self harm.  Safety Planning and Suicide Prevention discussed: .Reviewed with all patients during discharge planning group   [name], Joesph July 07/07/2013, 1:11 PM

## 2013-07-07 NOTE — [facility] Group Notes (Signed)
[facility] LCSW Group Therapy  07/07/2013  1:15 PM   Type of Therapy:  Group Therapy  Participation Level:  Active  Participation Quality:  Attentive, Sharing and Supportive  Affect:  Depressed and Flat  Cognitive:  Alert and Oriented  Insight:  Developing/Improving, Engaged and Supportive  Engagement in Therapy:  Developing/Improving, Engaged and Supportive  Modes of Intervention:  Activity, Clarification, Confrontation, Discussion, Education, Exploration, Limit-setting, Orientation, Problem-solving, Rapport Building, Dance movement psychotherapist, Socialization and Support  Summary of Progress/Problems: Patient was attentive and engaged with speaker from [employer].  Patient was attentive to speaker while they shared their story of dealing with mental health and overcoming it.  Patient expressed interest in their programs and services and received information on their agency.  Patient processed ways they can relate to the speaker. Pt came to group late but listened to group discussion.    [name], LCSW 07/07/2013 2:35 PM

## 2013-07-07 NOTE — Progress Notes (Signed)
Adult Psychoeducational Group Note  Date:  07/07/2013 Time:  1:43 PM  Group Topic/Focus:  Therapeutic Activity  Participation Level:  Did Not Attend  Additional Comments:  Pt was in bed asleep.  [name] 07/07/2013, 1:43 PM

## 2013-07-07 NOTE — Progress Notes (Signed)
Patient ID: [name], male   DOB: Dec 23, 1960, 52 y.o.   MRN: [id number]  D: Patient presents with flat affect and depressed mood. Pt. Denies SI/HI and A/V Hallucinations to Clinical research associate. Writer was able to get a Engineer, manufacturing for safety from patient to come and speak with staff if patient began to feel HI or SI. Patient is minimal and   Patient does not report any pain or discomfort at this time. Patient did not fill out daily inventory sheet.  A: Support and encouragement provided to the patient to go to groups. Scheduled medications were given to patient per physician orders.  R: Patient does not attend groups and is only seen in the milieu during snacks, medications, and meeting with treatment team one on one. Patient is guarded and minimal with Clinical research associate. Q15 minute checks are maintained for safety.

## 2013-07-07 NOTE — Progress Notes (Signed)
Patient ID: [name], male   DOB: 27-Feb-1961, 52 y.o.   MRN: [id number] PER STATE REGULATIONS 482.30  THIS CHART WAS REVIEWED FOR MEDICAL NECESSITY WITH RESPECT TO THE PATIENT'S ADMISSION/ DURATION OF STAY.  NEXT REVIEW DATE: 07/10/2013  Willa Rough, RN, BSN CASE MANAGER

## 2013-07-07 NOTE — Progress Notes (Signed)
D: Pt is flat in affect and depressed in mood. Pt is minimal with Clinical research associate. Pt reports an improvement in mood. Pt is isolative and was not present within the milieu.  A: Writer administered scheduled and prn medications to pt. Continued support and availability as needed was extended to this pt. Staff continue to monitor pt with q29min checks.  R: No adverse drug reactions noted. Pt receptive to treatment. Pt remains safe at this time.

## 2013-07-08 LAB — GLUCOSE, CAPILLARY: Glucose-Capillary: 232 mg/dL — ABNORMAL HIGH (ref 70–99)

## 2013-07-08 NOTE — Discharge Summary (Signed)
Physician Discharge Summary Note  Patient:  [name] is an 52 y.o., male MRN:  [id number] DOB:  Jul 27, 1961 Patient phone:  [phone] (home)  Patient address:   [address]  [place] [address],   Date of Admission:  06/30/2013 Date of Discharge: 12/12/201  Reason for Admission:  Depression with suicidal ideation and cocaine abuse  Discharge Diagnoses: Principal Problem:   Schizoaffective disorder, depressive type Active Problems:   Cocaine abuse with cocaine-induced mood disorder  ROS  DSM5: Schizophrenia Disorders:  Obsessive-Compulsive Disorders:  Trauma-Stressor Disorders:  Substance/Addictive Disorders: Cocaine abuse  Depressive Disorders: Major Depressive Disorder - Severe (296.23)  AXIS I: MDD severe w/o psychotic features  AXIS II: Deferred  AXIS III:  Past Medical History   Diagnosis  Date   .  Diabetes mellitus    .  Bipolar affective disorder    .  Schizophrenia, schizo-affective    .  Hypertension    .  Diabetes mellitus  03/11/2012   .  Bipolar disorder  03/11/2012   .  Anxiety    .  Depression    .  Hepatitis  06/30/2013     Type C    AXIS IV: housing problems, problems related to legal system/crime and problems with access to health care services  AXIS V: 51-60 moderate symptoms   Level of Care:  OP  Hospital Course:           [name] was admitted to the adult unit from the ED where he presented reporting depression with suicidal ideation and cocaine abuse He was evaluated and his symptoms were identified. Medication management was discussed and initiated. He was oriented to the unit and encouraged to participate in unit programming. Medical problems were identified and treated appropriately. Home medication was restarted as needed.        The patient was evaluated each day by a clinical provider to ascertain the patient's response to treatment.  Improvement was noted by the patient's report of decreasing symptoms, improved sleep  and appetite, affect, medication tolerance, behavior, and participation in unit programming.  [name] was asked each day to complete a self inventory noting mood, mental status, pain, new symptoms, anxiety and concerns.         He responded well to medication and being in a therapeutic and supportive environment. Positive and appropriate behavior was noted and the patient was motivated for recovery. [name] worked with the treatment team and case manager to develop a discharge plan with appropriate goals. Coping skills, problem solving as well as relaxation therapies were also part of the unit programming.         By the day of discharge [name] was in much improved condition than upon admission.  Symptoms were reported as significantly decreased or resolved completely. The patient denied SI/HI and voiced no AVH. He was motivated to continue taking medication with a goal of continued improvement in mental health.          [name] was discharged home with a plan to follow up as noted below.  Consults:  None  Significant Diagnostic Studies:  labs: CBC, CMP, UA, UDS  Discharge Vitals:   Blood pressure 130/88, pulse 81, temperature 97.9 F (36.6 C), temperature source Oral, resp. rate 16, height 5' 8.75" (1.746 m), weight 98.884 kg (218 lb). Body mass index is 32.44 kg/(m^2). Lab Results:   Results for orders placed during the hospital encounter of 06/30/13 (from the past 72 hour(s))  GLUCOSE, CAPILLARY  Status: Abnormal   Collection Time    07/05/13 11:36 AM      Result Value Range   Glucose-Capillary 177 (*) 70 - 99 mg/dL   Comment 1 Notify RN    GLUCOSE, CAPILLARY     Status: Abnormal   Collection Time    07/05/13  5:20 PM      Result Value Range   Glucose-Capillary 173 (*) 70 - 99 mg/dL  GLUCOSE, CAPILLARY     Status: Abnormal   Collection Time    07/05/13  8:48 PM      Result Value Range   Glucose-Capillary 204 (*) 70 - 99 mg/dL  GLUCOSE, CAPILLARY     Status: Abnormal    Collection Time    07/06/13  6:20 AM      Result Value Range   Glucose-Capillary 186 (*) 70 - 99 mg/dL  GLUCOSE, CAPILLARY     Status: Abnormal   Collection Time    07/06/13 11:46 AM      Result Value Range   Glucose-Capillary 294 (*) 70 - 99 mg/dL   Comment 1 Notify RN    GLUCOSE, CAPILLARY     Status: Abnormal   Collection Time    07/06/13  5:20 PM      Result Value Range   Glucose-Capillary 177 (*) 70 - 99 mg/dL   Comment 1 Notify RN    GLUCOSE, CAPILLARY     Status: Abnormal   Collection Time    07/06/13  8:46 PM      Result Value Range   Glucose-Capillary 208 (*) 70 - 99 mg/dL   Comment 1 Notify RN    GLUCOSE, CAPILLARY     Status: Abnormal   Collection Time    07/07/13  6:04 AM      Result Value Range   Glucose-Capillary 204 (*) 70 - 99 mg/dL  GLUCOSE, CAPILLARY     Status: Abnormal   Collection Time    07/07/13 11:51 AM      Result Value Range   Glucose-Capillary 222 (*) 70 - 99 mg/dL  GLUCOSE, CAPILLARY     Status: Abnormal   Collection Time    07/07/13  5:00 PM      Result Value Range   Glucose-Capillary 256 (*) 70 - 99 mg/dL  GLUCOSE, CAPILLARY     Status: Abnormal   Collection Time    07/07/13  8:58 PM      Result Value Range   Glucose-Capillary 238 (*) 70 - 99 mg/dL  GLUCOSE, CAPILLARY     Status: Abnormal   Collection Time    07/08/13  5:56 AM      Result Value Range   Glucose-Capillary 232 (*) 70 - 99 mg/dL    Physical Findings: AIMS: Facial and Oral Movements Muscles of Facial Expression: None, normal Lips and Perioral Area: None, normal Jaw: None, normal Tongue: None, normal,Extremity Movements Upper (arms, wrists, hands, fingers): None, normal Lower (legs, knees, ankles, toes): None, normal, Trunk Movements Neck, shoulders, hips: None, normal, Overall Severity Severity of abnormal movements (highest score from questions above): None, normal Incapacitation due to abnormal movements: None, normal Patient's awareness of abnormal movements  (rate only patient's report): No Awareness, Dental Status Current problems with teeth and/or dentures?: No Does patient usually wear dentures?: No  CIWA:  CIWA-Ar Total: 4 COWS:  COWS Total Score: 3  Psychiatric Specialty Exam: See Psychiatric Specialty Exam and Suicide Risk Assessment completed by Attending Physician prior to discharge.  Discharge destination:  [facility]  Is patient on multiple antipsychotic therapies at discharge:  No   Has Patient had three or more failed trials of antipsychotic monotherapy by history:  No  Recommended Plan for Multiple Antipsychotic Therapies: NA       Follow-up Information   Follow up with [facility] On 07/07/2013. (Today at 3:00 PM.  [facility] will pick patient up for transport)    Contact information:   [address] [place], [place]  [address]  [phone]      Follow-up recommendations:   Activities: Resume activity as tolerated. Diet: Heart healthy low sodium diet Tests: Follow up testing will be determined by your out patient provider.  Comments:    Total Discharge Time:  Greater than 30 minutes.  Signed: Rona Ravens. [place]burn RPAC 9:48 PM 07/10/2013  Patient was seen for psychiatric evaluation, suicide risk assessment and case discussed with physician extender and formulated treatment plan and also developed disposition plan after discussed with the treatment team. Reviewed the information documented and agree with the treatment plan.  Jovanni Rash,JANARDHAHA R. 07/11/2013 2:33 PM

## 2013-07-08 NOTE — Progress Notes (Signed)
[facility] Adult Case Management Discharge Plan :  Will you be returning to the same living situation after discharge: No. Patient is relocating to [facility]. At discharge, do you have transportation home?:Yes,  Patient assisted with bus ticket to [facility] Do you have the ability to pay for your medications:Yes,  Patient has Medicare and Medicaid but assisted with indigent meds due to not have any money until the first of the months.  Release of information consent forms completed and in the chart;  Patient's signature needed at discharge.  Patient to Follow up at: Follow-up Information   Follow up with [facility] On 07/08/2013. (Patient was unable to get a bed with [facility] - do not fax information)    Contact information:   [address] [place], [place]  [address]  [phone]      Follow up with [facility] On 08/04/2013. (Thursday, August 04, 2013 at 12:00 Noon.  Please bring photo ID and insurance cards.  If you are unable to keep apppointment, please notify withing 24 hours or they will not allow you to reschedule)    Contact information:   [address]  [place], [place]  [address]   [phone]      Follow up with [facility]. (No beds available - Do not fax information)    Contact information:   [place], [place]      Patient denies SI/HI: Patient no longer endorsing SI/HI or other thoughts of self harm.  Safety Planning and Suicide Prevention discussed: .Reviewed with all patients during discharge planning group    Diamon Reddinger, Joesph July 07/08/2013, 10:41 AM

## 2013-07-08 NOTE — BHH Suicide Risk Assessment (Signed)
Suicide Risk Assessment  Discharge Assessment     Demographic Factors:  Male, Adolescent or young adult, Low socioeconomic status and Unemployed  Mental Status Per Nursing Assessment::   On Admission:     Current Mental Status by Physician: Mental Status Examination: Patient appeared as per his stated age, casually dressed, and fairly groomed, and maintaining good eye contact. Patient has good mood and his affect was constricted. He has normal rate, rhythm, and volume of speech. His thought process is linear and goal directed. Patient has denied suicidal, homicidal ideations, intentions or plans. Patient has no evidence of auditory or visual hallucinations, delusions, and paranoia. Patient has fair insight judgment and impulse control.  Loss Factors: Decrease in vocational status and Financial problems/change in socioeconomic status  Historical Factors: Prior suicide attempts, Family history of mental illness or substance abuse and Impulsivity  Risk Reduction Factors:   Responsible for children under 11 years of age, Sense of responsibility to family, Religious beliefs about death, Positive social support, Positive therapeutic relationship and Positive coping skills or problem solving skills  Continued Clinical Symptoms:  Alcohol/Substance Abuse/Dependencies Schizophrenia:   Depressive state More than one psychiatric diagnosis Unstable or Poor Therapeutic Relationship Previous Psychiatric Diagnoses and Treatments Medical Diagnoses and Treatments/Surgeries  Cognitive Features That Contribute To Risk:  Polarized thinking    Suicide Risk:  Minimal: No identifiable suicidal ideation.  Patients presenting with no risk factors but with morbid ruminations; may be classified as minimal risk based on the severity of the depressive symptoms  Discharge Diagnoses:   AXIS I:  Schizoaffective Disorder, Substance Abuse and Substance Induced Mood Disorder AXIS II:  Deferred AXIS III:   Past  Medical History  Diagnosis Date  . Diabetes mellitus   . Bipolar affective disorder   . Schizophrenia, schizo-affective   . Hypertension   . Diabetes mellitus 03/11/2012  . Bipolar disorder 03/11/2012  . Anxiety   . Depression   . Hepatitis 06/30/2013    Type C   AXIS IV:  economic problems, housing problems, occupational problems, other psychosocial or environmental problems, problems related to social environment and problems with primary support group AXIS V:  61-70 mild symptoms  Plan Of Care/Follow-up recommendations:  Activity:  As tolerated Diet:  Regular  Is patient on multiple antipsychotic therapies at discharge:  No   Has Patient had three or more failed trials of antipsychotic monotherapy by history:  No  Recommended Plan for Multiple Antipsychotic Therapies: NA  Nehemiah Settle., M.D. 07/08/2013, 11:45 AM

## 2013-07-08 NOTE — Progress Notes (Signed)
Adult Psychoeducational Group Note  Date:  07/08/2013 Time:  11:02 AM  Group Topic/Focus:  Stages of Change:   The focus of this group is to explain the stages of change and help patients identify changes they want to make upon discharge.  Participation Level:  Minimal  Participation Quality:  Appropriate  Affect:  Flat  Cognitive:  Appropriate  Insight: Good  Engagement in Group:  Lacking  Modes of Intervention:  Discussion and Education    Roanna Banning, [place] N 07/08/2013, 11:02 AM

## 2013-07-08 NOTE — Progress Notes (Signed)
Discharge Note: Discharge instructions/prescriptions/medication samples/train and bus pass given to patient. Patient verbalized understanding of discharge instructions and prescriptions. Returned belongings to patient. Denies SI/HI/AVH. Patient d/c without incident to the lobby and transported by [employer] bus.

## 2013-07-08 NOTE — Progress Notes (Signed)
D: Pt in bed resting with eyes closed. Respirations even and unlabored. Pt appears to be in no signs of distress at this time. A: Q15min checks remains for this pt. R: Pt remains safe at this time.   

## 2013-07-08 NOTE — BHH Group Notes (Signed)
[facility] LCSW Aftercare Discharge Planning Group Note   07/08/2013 10:45 AM    Participation Quality:  Appropraite  Mood/Affect:  Appropriate  Depression Rating:  2  Anxiety Rating:  2  Thoughts of Suicide:  No  Will you contract for safety?   NA  Current AVH:  No  Plan for Discharge/Comments:  Patient attended discharge planning group and actively participated in group. Patient reports doing well and ready to discharge today.  He will follow up with [facility]. CSW provided all participants with daily workbook.   Transportation Means: Patient has transportation.   Supports:  Patient has a support system.   Lysa Livengood, Joesph July

## 2013-07-08 NOTE — Tx Team (Signed)
Interdisciplinary Treatment Plan Update   Date Reviewed:  07/08/2013  Time Reviewed:  9:36 AM  Progress in Treatment:   Attending groups: Yes Participating in groups: Yes Taking medication as prescribed: Yes  Tolerating medication: Yes Family/Significant other contact made: No, patient declined collateral contact Patient understands diagnosis: Yes  Discussing patient identified problems/goals with staff: Yes Medical problems stabilized or resolved: Yes Denies suicidal/homicidal ideation: Yes Patient has not harmed self or others: Yes  For review of initial/current patient goals, please see plan of care.  Estimated Length of Stay:  Discharge today  Reasons for Continued Hospitalization:   New Problems/Goals identified:    Discharge Plan or Barriers:   Home with outpatient follow up in [facility] in [place]  Additional Comments:  N/A   Attendees:  Patient:   [name] 07/08/2013 9:36 AM   Signature: Mervyn Gay, MD 07/08/2013 9:36 AM  Signature:  Lamount Cranker, RN  07/08/2013 9:36 AM  Signature:  Harold Barban, RN 07/08/2013 9:36 AM  Signature: Verne Spurr, PA  07/08/2013 9:36 AM  Signature:   07/08/2013 9:36 AM  Signature:  Juline Patch, LCSW 07/08/2013 9:36 AM  Signature:  [name], LCSW 07/08/2013 9:36 AM  Signature: 07/08/2013 9:36 AM  Signature:   07/08/2013 9:36 AM  Signature:  07/08/2013  9:36 AM  Signature:   Onnie Boer, RN [facility] 07/08/2013  9:36 AM  Signature:  07/08/2013  9:36 AM    Scribe for Treatment Team:   Juline Patch,  07/08/2013 9:36 AM

## 2013-07-13 NOTE — Progress Notes (Signed)
Patient Discharge Instructions:  After Visit Summary (AVS):   Faxed to:  07/13/13 Discharge Summary Note:   Faxed to:  07/13/13 Psychiatric Admission Assessment Note:   Faxed to:  07/13/13 Suicide Risk Assessment - Discharge Assessment:   Faxed to:  07/13/13 Faxed/Sent to the Next Level Care provider:  07/13/13 Faxed to [facility] @ [phone] No documentation was faxed to [facility] or [facility] for [facility].  Per the SW the patient will not be going to these facilities for follow up.  Jerelene Redden, 07/13/2013, 4:17 PM

## 2013-11-08 ENCOUNTER — Emergency Department (HOSPITAL_COMMUNITY)
Admission: EM | Admit: 2013-11-08 | Discharge: 2013-11-09 | Disposition: A | Discharge status: Home / Self Care | Payer: Medicare Other | Attending: Emergency Medicine | Admitting: Emergency Medicine

## 2013-11-08 ENCOUNTER — Encounter (HOSPITAL_COMMUNITY): Payer: Self-pay | Admitting: Emergency Medicine

## 2013-11-08 DIAGNOSIS — F1910 Other psychoactive substance abuse, uncomplicated: Secondary | ICD-10-CM | POA: Insufficient documentation

## 2013-11-08 DIAGNOSIS — K759 Inflammatory liver disease, unspecified: Secondary | ICD-10-CM | POA: Insufficient documentation

## 2013-11-08 DIAGNOSIS — F411 Generalized anxiety disorder: Secondary | ICD-10-CM | POA: Insufficient documentation

## 2013-11-08 DIAGNOSIS — R45851 Suicidal ideations: Secondary | ICD-10-CM

## 2013-11-08 DIAGNOSIS — E119 Type 2 diabetes mellitus without complications: Secondary | ICD-10-CM | POA: Insufficient documentation

## 2013-11-08 DIAGNOSIS — F319 Bipolar disorder, unspecified: Secondary | ICD-10-CM | POA: Insufficient documentation

## 2013-11-08 DIAGNOSIS — F333 Major depressive disorder, recurrent, severe with psychotic symptoms: Secondary | ICD-10-CM

## 2013-11-08 DIAGNOSIS — R4689 Other symptoms and signs involving appearance and behavior: Secondary | ICD-10-CM

## 2013-11-08 DIAGNOSIS — I10 Essential (primary) hypertension: Secondary | ICD-10-CM | POA: Insufficient documentation

## 2013-11-08 DIAGNOSIS — F3289 Other specified depressive episodes: Secondary | ICD-10-CM | POA: Insufficient documentation

## 2013-11-08 DIAGNOSIS — F259 Schizoaffective disorder, unspecified: Secondary | ICD-10-CM | POA: Insufficient documentation

## 2013-11-08 DIAGNOSIS — R4589 Other symptoms and signs involving emotional state: Secondary | ICD-10-CM

## 2013-11-08 DIAGNOSIS — F329 Major depressive disorder, single episode, unspecified: Secondary | ICD-10-CM | POA: Insufficient documentation

## 2013-11-08 LAB — COMPREHENSIVE METABOLIC PANEL
ALK PHOS: 68 U/L (ref 39–117)
ALT: 34 U/L (ref 0–53)
AST: 22 U/L (ref 0–37)
Albumin: 3.8 g/dL (ref 3.5–5.2)
BILIRUBIN TOTAL: 0.4 mg/dL (ref 0.3–1.2)
BUN: 14 mg/dL (ref 6–23)
CO2: 22 mEq/L (ref 19–32)
Calcium: 9.6 mg/dL (ref 8.4–10.5)
Chloride: 93 mEq/L — ABNORMAL LOW (ref 96–112)
Creatinine, Ser: 0.9 mg/dL (ref 0.50–1.35)
GFR calc non Af Amer: >90 mL/min (ref >90)
GLUCOSE: 364 mg/dL — AB (ref 70–99)
POTASSIUM: 4.3 meq/L (ref 3.7–5.3)
SODIUM: 133 meq/L — AB (ref 137–147)
TOTAL PROTEIN: 8 g/dL (ref 6.0–8.3)

## 2013-11-08 LAB — SALICYLATE LEVEL: Salicylate Lvl: <2 mg/dL — ABNORMAL LOW (ref 2.8–20.0)

## 2013-11-08 LAB — CBC
HCT: 38.7 % — ABNORMAL LOW (ref 39.0–52.0)
Hemoglobin: 13.2 g/dL (ref 13.0–17.0)
MCH: 29 pg (ref 26.0–34.0)
MCHC: 34.1 g/dL (ref 30.0–36.0)
MCV: 85.1 fL (ref 78.0–100.0)
Platelets: 258 10*3/uL (ref 150–400)
RBC: 4.55 MIL/uL (ref 4.22–5.81)
RDW: 12.4 % (ref 11.5–15.5)
WBC: 8.3 10*3/uL (ref 4.0–10.5)

## 2013-11-08 LAB — ACETAMINOPHEN LEVEL: Acetaminophen (Tylenol), Serum: <15 ug/mL (ref 10–30)

## 2013-11-08 LAB — ETHANOL: Alcohol, Ethyl (B): <11 mg/dL (ref 0–11)

## 2013-11-08 MED ORDER — DULOXETINE HCL 20 MG PO CPEP
40.0000 mg | ORAL_CAPSULE | Freq: Every day | ORAL | Status: DC
  Administered 2013-11-09: 40 mg via ORAL
  Filled 2013-11-08: qty 2

## 2013-11-08 MED ORDER — METFORMIN HCL 500 MG PO TABS
1000.0000 mg | ORAL_TABLET | Freq: Two times a day (BID) | ORAL | Status: DC
  Administered 2013-11-09 (×2): 1000 mg via ORAL
  Filled 2013-11-08 (×3): qty 2

## 2013-11-08 MED ORDER — INSULIN ASPART 100 UNIT/ML FLEXPEN: 3.0000 [IU] | PEN_INJECTOR | Freq: Three times a day (TID) | SUBCUTANEOUS | Status: DC

## 2013-11-08 MED ORDER — INSULIN ASPART 100 UNIT/ML ~~LOC~~ SOLN
0.0000 [IU] | Freq: Three times a day (TID) | SUBCUTANEOUS | Status: DC
  Administered 2013-11-09: 2 [IU] via SUBCUTANEOUS
  Administered 2013-11-09: 3 [IU] via SUBCUTANEOUS
  Administered 2013-11-09: 9 [IU] via SUBCUTANEOUS
  Administered 2013-11-09: 3 [IU] via SUBCUTANEOUS
  Filled 2013-11-08 (×4): qty 1

## 2013-11-08 MED ORDER — INSULIN DETEMIR 100 UNIT/ML ~~LOC~~ SOLN
40.0000 [IU] | Freq: Every day | SUBCUTANEOUS | Status: DC
  Administered 2013-11-08: 40 [IU] via SUBCUTANEOUS
  Filled 2013-11-08 (×3): qty 0.4

## 2013-11-08 MED ORDER — QUETIAPINE FUMARATE 300 MG PO TABS
300.0000 mg | ORAL_TABLET | Freq: Every day | ORAL | Status: DC
  Administered 2013-11-08: 300 mg via ORAL
  Filled 2013-11-08: qty 1

## 2013-11-08 NOTE — ED Notes (Signed)
Pt has been having thoughts of suicide for 3 days.  Was going to shoot self, but cousin took gun and then today was trying to walk out into traffic.  Denies taking anything to harm self.

## 2013-11-08 NOTE — ED Provider Notes (Signed)
CSN: [id number]     Arrival date & time 11/08/13  1722 History  This chart was scribed for non-physician practitioner, Roxy Horsemanobert Onetha Gaffey, PA-C,working with Juliet RudeNathan R. Rubin PayorPickering, MD, by Karle PlumberJennifer Tensley, ED Scribe.  This patient was seen in room TR05C/TR05C and the patient's care was started at 7:00 PM.  Chief Complaint  Patient presents with  . Suicidal   The history is provided by the patient. No language interpreter was used.   HPI Comments:  Joseph LefortReginald Vasquez is a 53 y.o. obese male with h/o DM who presents to the Emergency Department complaining of suicidal ideations that started approximately three days ago secondary to his stepmother passing away recently. Pt states he has h/o suicide attempts of overdosing on medication and trying to shoot himself. He states he tried to shoot himself today. He reports he has a slight HA at the moment but denies any other pain. Pt reports auditory hallucinations. He denies homicidal ideations.   Past Medical History  Diagnosis Date  . Diabetes mellitus   . Bipolar affective disorder   . Schizophrenia, schizo-affective   . Hypertension   . Diabetes mellitus 03/11/2012  . Bipolar disorder 03/11/2012  . Anxiety   . Depression   . Hepatitis 06/30/2013    Type C   Past Surgical History  Procedure Laterality Date  . Finger surgery    . Wisdom tooth extraction     No family history on file. History  Substance Use Topics  . Smoking status: Never Smoker   . Smokeless tobacco: Never Used  . Alcohol Use: No    Review of Systems  Psychiatric/Behavioral: Positive for suicidal ideas and hallucinations (auditory).    Allergies  Review of patient's allergies indicates no known allergies.  Home Medications   Prior to Admission medications   Medication Sig Start Date End Date Taking? Authorizing Provider  DULoxetine (CYMBALTA) 20 MG capsule Take 2 capsules (40 mg total) by mouth daily. For depression 07/07/13   Sanjuana KavaAgnes I Nwoko, NP  insulin aspart (NOVOLOG  FLEXPEN) 100 UNIT/ML SOPN FlexPen Inject 3-10 Units into the skin 3 (three) times daily with meals. Per sliding scale: For diabetes management 07/07/13   Sanjuana KavaAgnes I Nwoko, NP  insulin detemir (LEVEMIR) 100 UNIT/ML injection Inject 0.4 mLs (40 Units total) into the skin at bedtime. For diabetes control 07/07/13   Sanjuana KavaAgnes I Nwoko, NP  metFORMIN (GLUCOPHAGE) 1000 MG tablet Take 1 tablet (1,000 mg total) by mouth 2 (two) times daily with a meal. For diabetes control 07/07/13   Sanjuana KavaAgnes I Nwoko, NP  QUEtiapine (SEROQUEL) 300 MG tablet Take 1 tablet (300 mg total) by mouth at bedtime. For mood control 07/07/13   Sanjuana KavaAgnes I Nwoko, NP   Triage Vitals: BP 107/71  Pulse 92  Temp(Src) 97.9 F (36.6 C) (Oral)  Resp 22  Ht 5\' 9"  (1.753 m)  Wt 240 lb (108.863 kg)  BMI 35.43 kg/m2  SpO2 96% Physical Exam  Nursing note and vitals reviewed. Constitutional: He is oriented to person, place, and time. He appears well-developed and well-nourished.  HENT:  Head: Normocephalic and atraumatic.  Eyes: EOM are normal.  Neck: Normal range of motion.  Cardiovascular: Normal rate, regular rhythm and normal heart sounds.  Exam reveals no gallop and no friction rub.   No murmur heard. Pulmonary/Chest: Effort normal and breath sounds normal. No respiratory distress. He has no wheezes. He has no rales. He exhibits no tenderness.  Musculoskeletal: Normal range of motion.  Neurological: He is alert and oriented to person,  place, and time.  Skin: Skin is warm and dry.  Psychiatric:  Withdrawn, flat affect.    ED Course  Procedures (including critical care time) DIAGNOSTIC STUDIES: Oxygen Saturation is 96% on RA, normal by my interpretation.   COORDINATION OF CARE: 7:05 PM- Will order standard medical clearance labs. Pt verbalizes understanding and agrees to plan.  Medications  DULoxetine (CYMBALTA) DR capsule 40 mg (not administered)  insulin aspart (NOVOLOG) FlexPen 3-10 Units (not administered)  insulin detemir  (LEVEMIR) injection 40 Units (not administered)  metFORMIN (GLUCOPHAGE) tablet 1,000 mg (not administered)  QUEtiapine (SEROQUEL) tablet 300 mg (not administered)   Results for orders placed during the hospital encounter of 11/08/13  ACETAMINOPHEN LEVEL      Result Value Ref Range   Acetaminophen (Tylenol), Serum <15.0  10 - 30 ug/mL  CBC      Result Value Ref Range   WBC 8.3  4.0 - 10.5 K/uL   RBC 4.55  4.22 - 5.81 MIL/uL   Hemoglobin 13.2  13.0 - 17.0 g/dL   HCT 91.438.7 (*) 78.239.0 - 95.652.0 %   MCV 85.1  78.0 - 100.0 fL   MCH 29.0  26.0 - 34.0 pg   MCHC 34.1  30.0 - 36.0 g/dL   RDW 21.312.4  08.611.5 - 57.815.5 %   Platelets 258  150 - 400 K/uL  COMPREHENSIVE METABOLIC PANEL      Result Value Ref Range   Sodium 133 (*) 137 - 147 mEq/L   Potassium 4.3  3.7 - 5.3 mEq/L   Chloride 93 (*) 96 - 112 mEq/L   CO2 22  19 - 32 mEq/L   Glucose, Bld 364 (*) 70 - 99 mg/dL   BUN 14  6 - 23 mg/dL   Creatinine, Ser 4.690.90  0.50 - 1.35 mg/dL   Calcium 9.6  8.4 - 62.910.5 mg/dL   Total Protein 8.0  6.0 - 8.3 g/dL   Albumin 3.8  3.5 - 5.2 g/dL   AST 22  0 - 37 U/L   ALT 34  0 - 53 U/L   Alkaline Phosphatase 68  39 - 117 U/L   Total Bilirubin 0.4  0.3 - 1.2 mg/dL   GFR calc non Af Amer >90  >90 mL/min   GFR calc Af Amer >90  >90 mL/min  ETHANOL      Result Value Ref Range   Alcohol, Ethyl (B) <11  0 - 11 mg/dL  SALICYLATE LEVEL      Result Value Ref Range   Salicylate Lvl <2.0 (*) 2.8 - 20.0 mg/dL  URINE RAPID DRUG SCREEN (HOSP PERFORMED)      Result Value Ref Range   Opiates NONE DETECTED  NONE DETECTED   Cocaine NONE DETECTED  NONE DETECTED   Benzodiazepines NONE DETECTED  NONE DETECTED   Amphetamines NONE DETECTED  NONE DETECTED   Tetrahydrocannabinol POSITIVE (*) NONE DETECTED   Barbiturates NONE DETECTED  NONE DETECTED   No results found.   Imaging Review No results found.   EKG Interpretation None      MDM   Final diagnoses:  Suicidal behavior    Patient with SI. Also concern for HI.   Move to pod C.  TTS evaluation.  I personally performed the services described in this documentation, which was scribed in my presence. The recorded information has been reviewed and is accurate.    Roxy Horsemanobert Carolynne Schuchard, PA-C 11/09/13 [phone]

## 2013-11-08 NOTE — ED Notes (Signed)
Pt was wanded by triage and valuables collected and bagged.  Sitter at bedside

## 2013-11-08 NOTE — ED Notes (Signed)
[facility] and stated someone will be there soon.  Charge Kristi aware.  Pt being changed into blue scrubs.  Paged security

## 2013-11-09 ENCOUNTER — Inpatient Hospital Stay (HOSPITAL_COMMUNITY)
Admission: AD | Admit: 2013-11-09 | Discharge: 2013-11-17 | DRG: 885 | Disposition: A | Discharge status: Home / Self Care | Payer: Medicaid Other | Source: Intra-hospital | Attending: Psychiatry | Admitting: Psychiatry

## 2013-11-09 ENCOUNTER — Encounter (HOSPITAL_COMMUNITY): Payer: Self-pay | Admitting: Behavioral Health

## 2013-11-09 ENCOUNTER — Encounter (HOSPITAL_COMMUNITY): Payer: Self-pay | Admitting: Registered Nurse

## 2013-11-09 DIAGNOSIS — R45851 Suicidal ideations: Secondary | ICD-10-CM

## 2013-11-09 DIAGNOSIS — F411 Generalized anxiety disorder: Secondary | ICD-10-CM | POA: Diagnosis present

## 2013-11-09 DIAGNOSIS — B1920 Unspecified viral hepatitis C without hepatic coma: Secondary | ICD-10-CM | POA: Diagnosis present

## 2013-11-09 DIAGNOSIS — F1410 Cocaine abuse, uncomplicated: Secondary | ICD-10-CM

## 2013-11-09 DIAGNOSIS — F4310 Post-traumatic stress disorder, unspecified: Secondary | ICD-10-CM | POA: Diagnosis present

## 2013-11-09 DIAGNOSIS — F333 Major depressive disorder, recurrent, severe with psychotic symptoms: Secondary | ICD-10-CM | POA: Diagnosis present

## 2013-11-09 DIAGNOSIS — F259 Schizoaffective disorder, unspecified: Principal | ICD-10-CM | POA: Diagnosis present

## 2013-11-09 DIAGNOSIS — Z794 Long term (current) use of insulin: Secondary | ICD-10-CM

## 2013-11-09 DIAGNOSIS — F1414 Cocaine abuse with cocaine-induced mood disorder: Secondary | ICD-10-CM

## 2013-11-09 DIAGNOSIS — I10 Essential (primary) hypertension: Secondary | ICD-10-CM | POA: Diagnosis present

## 2013-11-09 DIAGNOSIS — F1210 Cannabis abuse, uncomplicated: Secondary | ICD-10-CM | POA: Diagnosis present

## 2013-11-09 DIAGNOSIS — R45850 Homicidal ideations: Secondary | ICD-10-CM

## 2013-11-09 DIAGNOSIS — F251 Schizoaffective disorder, depressive type: Secondary | ICD-10-CM

## 2013-11-09 DIAGNOSIS — E119 Type 2 diabetes mellitus without complications: Secondary | ICD-10-CM | POA: Diagnosis present

## 2013-11-09 LAB — GLUCOSE, CAPILLARY: GLUCOSE-CAPILLARY: 284 mg/dL — AB (ref 70–99)

## 2013-11-09 LAB — RAPID URINE DRUG SCREEN, HOSP PERFORMED
Amphetamines: NOT DETECTED
BARBITURATES: NOT DETECTED
Benzodiazepines: NOT DETECTED
COCAINE: NOT DETECTED
Opiates: NOT DETECTED
TETRAHYDROCANNABINOL: POSITIVE — AB

## 2013-11-09 LAB — CBG MONITORING, ED
GLUCOSE-CAPILLARY: 363 mg/dL — AB (ref 70–99)
Glucose-Capillary: 291 mg/dL — ABNORMAL HIGH (ref 70–99)
Glucose-Capillary: 221 mg/dL — ABNORMAL HIGH (ref 70–99)
Glucose-Capillary: 184 mg/dL — ABNORMAL HIGH (ref 70–99)
Glucose-Capillary: 206 mg/dL — ABNORMAL HIGH (ref 70–99)

## 2013-11-09 MED ORDER — INSULIN ASPART 100 UNIT/ML ~~LOC~~ SOLN
0.0000 [IU] | Freq: Three times a day (TID) | SUBCUTANEOUS | Status: DC
  Administered 2013-11-10: 5 [IU] via SUBCUTANEOUS
  Administered 2013-11-10: 9 [IU] via SUBCUTANEOUS

## 2013-11-09 MED ORDER — ACETAMINOPHEN 325 MG PO TABS
650.0000 mg | ORAL_TABLET | Freq: Four times a day (QID) | ORAL | Status: DC | PRN
Start: 2013-11-09 — End: 2013-11-17

## 2013-11-09 MED ORDER — METFORMIN HCL 500 MG PO TABS
1000.0000 mg | ORAL_TABLET | Freq: Two times a day (BID) | ORAL | Status: DC
  Administered 2013-11-10 – 2013-11-17 (×15): 1000 mg via ORAL
  Filled 2013-11-09 (×6): qty 2
  Filled 2013-11-09: qty 28
  Filled 2013-11-09 (×7): qty 2
  Filled 2013-11-09: qty 28
  Filled 2013-11-09 (×4): qty 2

## 2013-11-09 MED ORDER — MAGNESIUM HYDROXIDE 400 MG/5ML PO SUSP: 30.0000 mL | Freq: Every day | ORAL | Status: DC | PRN

## 2013-11-09 MED ORDER — DULOXETINE HCL 20 MG PO CPEP
40.0000 mg | ORAL_CAPSULE | Freq: Every day | ORAL | Status: DC
  Administered 2013-11-10 – 2013-11-11 (×2): 40 mg via ORAL
  Filled 2013-11-09 (×4): qty 2

## 2013-11-09 MED ORDER — QUETIAPINE FUMARATE 300 MG PO TABS
300.0000 mg | ORAL_TABLET | Freq: Every day | ORAL | Status: DC
  Administered 2013-11-09 – 2013-11-10 (×2): 300 mg via ORAL
  Filled 2013-11-09 (×4): qty 1

## 2013-11-09 MED ORDER — ALUM & MAG HYDROXIDE-SIMETH 200-200-20 MG/5ML PO SUSP: 30.0000 mL | ORAL | Status: DC | PRN

## 2013-11-09 MED ORDER — INSULIN DETEMIR 100 UNIT/ML ~~LOC~~ SOLN
40.0000 [IU] | Freq: Every day | SUBCUTANEOUS | Status: DC
  Administered 2013-11-09 – 2013-11-11 (×3): 40 [IU] via SUBCUTANEOUS

## 2013-11-09 NOTE — Tx Team (Signed)
Initial Interdisciplinary Treatment Plan  PATIENT STRENGTHS: (choose at least two) Ability for insight Capable of independent living Motivation for treatment/growth  PATIENT STRESSORS: Loss of a friend   PROBLEM LIST: Problem List/Patient Goals Date to be addressed Date deferred Reason deferred Estimated date of resolution  SI thoughts 11/09/13     Auditory hallucinations  11/09/13                                                DISCHARGE CRITERIA:  Ability to meet basic life and health needs Adequate post-discharge living arrangements Improved stabilization in mood, thinking, and/or behavior Verbal commitment to aftercare and medication compliance  PRELIMINARY DISCHARGE PLAN: Attend aftercare/continuing care group Attend PHP/IOP  PATIENT/FAMIILY INVOLVEMENT: This treatment plan has been presented to and reviewed with the patient, [name], and/or family member.  The patient and family have been given the opportunity to ask questions and make suggestions.  [name] [name] 11/09/2013, 5:39 PM

## 2013-11-09 NOTE — Progress Notes (Signed)
The focus of this group is to help patients review their daily goal of treatment and discuss progress on daily workbooks. Pt attended the evening group session and responded to all discussion prompts from the Writer. Pt shared that today was a good day, the highlight of which was being transferred here. "I need to be here, so I'm glad I was able to come." Pt's only additional need from Nursing Staff this evening was to receive towels, which were given to him following group. Pt shared that upon discharge he planned to stay on his medication in order to stay well. Pt appeared tired in group and frequently closed his eyes. "I'm feeling really sleepy."

## 2013-11-09 NOTE — [facility] Counselor (Signed)
[name], pt [facility] to bed 402-1. [facility]. Originals placed in pt's chart.  [name] [name], [place] Assessment Counselor

## 2013-11-09 NOTE — Progress Notes (Signed)
Per ac, patient anticipated to have a 400 hall bed later today. Support paperwork to be completed by tts/csw  when bed assigned. RN to call report once room number has been assigned.   Byrd HesselbachKristen Khalfani Weideman, LCSW [phone]  ED CSW 11/09/2013 1119am

## 2013-11-09 NOTE — Consult Note (Signed)
[facility] Face-to-Face Psychiatry Consult   Reason for Consult:  Suicidal ideation and depression Referring Physician:  EDP  [name] is an 53 y.o. male. Total Time spent with patient: 45 minutes  Assessment: AXIS I:  Major Depression, Recurrent severe and Substance Abuse AXIS II:  Deferred AXIS III:   Past Medical History  Diagnosis Date  . Diabetes mellitus   . Bipolar affective disorder   . Schizophrenia, schizo-affective   . Hypertension   . Diabetes mellitus 03/11/2012  . Bipolar disorder 03/11/2012  . Anxiety   . Depression   . Hepatitis 06/30/2013    Type C   AXIS IV:  other psychosocial or environmental problems and problems with primary support group AXIS V:  11-20 some danger of hurting self or others possible OR occasionally fails to maintain minimal personal hygiene OR gross impairment in communication  Plan:  Recommend psychiatric Inpatient admission when medically cleared.  Subjective:   [name] is a 53 y.o. male patient admitted with Major Depression, Recurrent severe and Substance Abuse.  HPI:  Patient states "I want to kill my self.  I've been feeling like this for 3-4 days.  My mom died 67 weeks ago (step mother but really close like a mother)."  Patient states that 8-9 months ago prior suicide attempt to shoot himself and overdose related to separation from his wife.  "Divorced now but it still messes with my head."  Patient states that he feels helpless.   Patient states that he is also hearing voices that are telling him to kill him self and other. Patient is endorsing suicidal ideation with plan to shoot himself (a friend did remove the gun from house).  Patient is also endorsing auditory hallucinations (voices telling him to kill himself and other).  Patient denies paranoia and homicidal ideation.  HPI Elements:   Location:  Worsening depression. Quality:  auditory hallucinations telling to kill self and others. Severity:  wanting to shoot  himself. Timing:  Long history of depression.  Recent episoide worsening over the last 3-4 days.  Review of Systems  Constitutional: Negative for weight loss and diaphoresis.  Musculoskeletal: Negative.   Neurological: Negative for dizziness, tremors and weakness.  Psychiatric/Behavioral: Positive for depression, suicidal ideas, hallucinations (auditory) and substance abuse (THC and prior history of cocaine). Negative for memory loss. The patient has insomnia. The patient is not nervous/anxious.     Denies family history of mental illness or substance abuse. Past Psychiatric History: Past Medical History  Diagnosis Date  . Diabetes mellitus   . Bipolar affective disorder   . Schizophrenia, schizo-affective   . Hypertension   . Diabetes mellitus 03/11/2012  . Bipolar disorder 03/11/2012  . Anxiety   . Depression   . Hepatitis 06/30/2013    Type C    reports that he has never smoked. He has never used smokeless tobacco. He reports that he uses illicit drugs (Marijuana and Cocaine). He reports that he does not drink alcohol. No family history on file.         Allergies:  No Known Allergies  ACT Assessment Complete:  Yes:    Educational Status    Risk to Self: Risk to self Is patient at risk for suicide?: Yes Substance abuse history and/or treatment for substance abuse?: No  Risk to Others:    Abuse:    Prior Inpatient Therapy:    Prior Outpatient Therapy:    Additional Information:     Objective: Blood pressure 100/58, pulse 70, temperature 97.7  F (36.5 C), temperature source Oral, resp. rate 18, height 5' 9"  (1.753 m), weight 108.863 kg (240 lb), SpO2 78.00%.Body mass index is 35.43 kg/(m^2). Results for orders placed during the hospital encounter of 11/08/13 (from the past 72 hour(s))  ACETAMINOPHEN LEVEL     Status: None   Collection Time    11/08/13  5:41 PM      Result Value Ref Range   Acetaminophen (Tylenol), Serum <15.0  10 - 30 ug/mL   Comment:             THERAPEUTIC CONCENTRATIONS VARY     SIGNIFICANTLY. A RANGE OF 10-30     ug/mL MAY BE AN EFFECTIVE     CONCENTRATION FOR MANY PATIENTS.     HOWEVER, SOME ARE BEST TREATED     AT CONCENTRATIONS OUTSIDE THIS     RANGE.     ACETAMINOPHEN CONCENTRATIONS     >150 ug/mL AT 4 HOURS AFTER     INGESTION AND >50 ug/mL AT 12     HOURS AFTER INGESTION ARE     OFTEN ASSOCIATED WITH TOXIC     REACTIONS.  CBC     Status: Abnormal   Collection Time    11/08/13  5:41 PM      Result Value Ref Range   WBC 8.3  4.0 - 10.5 K/uL   RBC 4.55  4.22 - 5.81 MIL/uL   Hemoglobin 13.2  13.0 - 17.0 g/dL   HCT 38.7 (*) 39.0 - 52.0 %   MCV 85.1  78.0 - 100.0 fL   MCH 29.0  26.0 - 34.0 pg   MCHC 34.1  30.0 - 36.0 g/dL   RDW 12.4  11.5 - 15.5 %   Platelets 258  150 - 400 K/uL  COMPREHENSIVE METABOLIC PANEL     Status: Abnormal   Collection Time    11/08/13  5:41 PM      Result Value Ref Range   Sodium 133 (*) 137 - 147 mEq/L   Potassium 4.3  3.7 - 5.3 mEq/L   Chloride 93 (*) 96 - 112 mEq/L   CO2 22  19 - 32 mEq/L   Glucose, Bld 364 (*) 70 - 99 mg/dL   BUN 14  6 - 23 mg/dL   Creatinine, Ser 0.90  0.50 - 1.35 mg/dL   Calcium 9.6  8.4 - 10.5 mg/dL   Total Protein 8.0  6.0 - 8.3 g/dL   Albumin 3.8  3.5 - 5.2 g/dL   AST 22  0 - 37 U/L   ALT 34  0 - 53 U/L   Alkaline Phosphatase 68  39 - 117 U/L   Total Bilirubin 0.4  0.3 - 1.2 mg/dL   GFR calc non Af Amer >90  >90 mL/min   GFR calc Af Amer >90  >90 mL/min   Comment: (NOTE)     The eGFR has been calculated using the CKD EPI equation.     This calculation has not been validated in all clinical situations.     eGFR's persistently <90 mL/min signify possible Chronic Kidney     Disease.  ETHANOL     Status: None   Collection Time    11/08/13  5:41 PM      Result Value Ref Range   Alcohol, Ethyl (B) <11  0 - 11 mg/dL   Comment:            LOWEST DETECTABLE LIMIT FOR     SERUM ALCOHOL IS 11 mg/dL  FOR MEDICAL PURPOSES ONLY  SALICYLATE LEVEL      Status: Abnormal   Collection Time    11/08/13  5:41 PM      Result Value Ref Range   Salicylate Lvl <6.3 (*) 2.8 - 20.0 mg/dL  CBG MONITORING, ED     Status: Abnormal   Collection Time    11/08/13 11:05 PM      Result Value Ref Range   Glucose-Capillary 291 (*) 70 - 99 mg/dL  URINE RAPID DRUG SCREEN (HOSP PERFORMED)     Status: Abnormal   Collection Time    11/09/13 12:26 AM      Result Value Ref Range   Opiates NONE DETECTED  NONE DETECTED   Cocaine NONE DETECTED  NONE DETECTED   Benzodiazepines NONE DETECTED  NONE DETECTED   Amphetamines NONE DETECTED  NONE DETECTED   Tetrahydrocannabinol POSITIVE (*) NONE DETECTED   Barbiturates NONE DETECTED  NONE DETECTED   Comment:            DRUG SCREEN FOR MEDICAL PURPOSES     ONLY.  IF CONFIRMATION IS NEEDED     FOR ANY PURPOSE, NOTIFY LAB     WITHIN 5 DAYS.                LOWEST DETECTABLE LIMITS     FOR URINE DRUG SCREEN     Drug Class       Cutoff (ng/mL)     Amphetamine      1000     Barbiturate      200     Benzodiazepine   845     Tricyclics       364     Opiates          300     Cocaine          300     THC              50  CBG MONITORING, ED     Status: Abnormal   Collection Time    11/09/13  3:21 AM      Result Value Ref Range   Glucose-Capillary 363 (*) 70 - 99 mg/dL   Comment 1 Documented in Chart    CBG MONITORING, ED     Status: Abnormal   Collection Time    11/09/13  7:39 AM      Result Value Ref Range   Glucose-Capillary 221 (*) 70 - 99 mg/dL   Labs are reviewed and are pertinent for UDS positive for THC.  Glucose capillary, CBC, CMET abnormal values.  Medications reviewed and no changes made.   Current Facility-Administered Medications  Medication Dose Route Frequency Provider Last Rate Last Dose  . DULoxetine (CYMBALTA) DR capsule 40 mg  40 mg Oral Daily Montine Circle, PA-C   40 mg at 11/09/13 1014  . insulin aspart (novoLOG) injection 0-9 Units  0-9 Units Subcutaneous TID WC Nathan R. Alvino Chapel, MD    3 Units at 11/09/13 0751  . insulin detemir (LEVEMIR) injection 40 Units  40 Units Subcutaneous QHS Montine Circle, PA-C   40 Units at 11/08/13 2300  . metFORMIN (GLUCOPHAGE) tablet 1,000 mg  1,000 mg Oral BID WC Montine Circle, PA-C   1,000 mg at 11/09/13 6803  . QUEtiapine (SEROQUEL) tablet 300 mg  300 mg Oral QHS Montine Circle, PA-C   300 mg at 11/08/13 2300   Current Outpatient Prescriptions  Medication Sig Dispense Refill  . DULoxetine (CYMBALTA) 20 MG capsule  Take 2 capsules (40 mg total) by mouth daily. For depression  60 capsule  0  . insulin aspart (NOVOLOG FLEXPEN) 100 UNIT/ML SOPN FlexPen Inject 3-10 Units into the skin 3 (three) times daily with meals. Per sliding scale: For diabetes management      . insulin detemir (LEVEMIR) 100 UNIT/ML injection Inject 0.4 mLs (40 Units total) into the skin at bedtime. For diabetes control  10 mL  12  . metFORMIN (GLUCOPHAGE) 1000 MG tablet Take 1 tablet (1,000 mg total) by mouth 2 (two) times daily with a meal. For diabetes control      . QUEtiapine (SEROQUEL) 300 MG tablet Take 1 tablet (300 mg total) by mouth at bedtime. For mood control  30 tablet  0    Psychiatric Specialty Exam:     Blood pressure 100/58, pulse 70, temperature 97.7 F (36.5 C), temperature source Oral, resp. rate 18, height 5' 9"  (1.753 m), weight 108.863 kg (240 lb), SpO2 78.00%.Body mass index is 35.43 kg/(m^2).  General Appearance: Casual  Eye Contact::  Good  Speech:  Clear and Coherent and Normal Rate  Volume:  Normal  Mood:  Depressed and Hopeless  Affect:  Depressed and Flat  Thought Process:  Circumstantial and Goal Directed  Orientation:  Full (Time, Place, and Person)  Thought Content:  Hallucinations: Auditory Command:  to kill self and other  Suicidal Thoughts:  Yes.  with intent/plan  Homicidal Thoughts:  No  Memory:  Immediate;   Good Recent;   Good Remote;   Good  Judgement:  Impaired  Insight:  Lacking  Psychomotor Activity:  Decreased   Concentration:  Fair  Recall:  Good  Fund of Knowledge:Good  Language: Good  Akathisia:  No  Handed:  Right  AIMS (if indicated):     Assets:  Communication Skills Desire for Improvement Housing  Sleep:      Musculoskeletal: Strength & Muscle Tone: within normal limits Gait & Station: normal Patient leans: N/A  Treatment Plan Summary: Daily contact with patient to assess and evaluate symptoms and progress in treatment Medication management Inpatient treatment recommended.  Patient has been accepted to [facility] Letitia Libra, RN [facility]) will call with room/bed number after discharges to rooms.    Joseph Pucciarelli, FNP-BC 11/09/2013 10:28 AM

## 2013-11-09 NOTE — Consult Note (Signed)
Face to face evaluation and I agree with this note 

## 2013-11-09 NOTE — Progress Notes (Signed)
Patient ID: Andre LefortReginald Plagge, male   DOB: 08/12/1960, 53 y.o.   MRN: [id number] D: pt. In dayroom watching TV, reports depression at "10" of 10. Pt. Reports "voices say hurt myself"  A: Writer introduces self to client, reviews medications and administration schedule. Writer encourages group. Staff will monitor q8715min for safety. R: pt. Is safe on the unit, attends group. Pt. Contracts for safety.

## 2013-11-09 NOTE — Progress Notes (Signed)
Admission: Pt reports that his close friend recently passed away. Pt reports he started hearing voices telling him to harm himself and others. Pt stated that he was having on and off self harm thoughts. Pt contracts for safety at this time. Pt stated that he have a hx of using cocaine. Pt last used cocaine 7 months ago. Pt reports that he haven't had any alcohol and has remained sober for the last 15 years.

## 2013-11-09 NOTE — ED Notes (Signed)
Patient given a snack 

## 2013-11-09 NOTE — ED Notes (Signed)
Report received from Paul, RN

## 2013-11-10 ENCOUNTER — Encounter (HOSPITAL_COMMUNITY): Payer: Self-pay | Admitting: Psychiatry

## 2013-11-10 DIAGNOSIS — F259 Schizoaffective disorder, unspecified: Principal | ICD-10-CM

## 2013-11-10 DIAGNOSIS — F1210 Cannabis abuse, uncomplicated: Secondary | ICD-10-CM

## 2013-11-10 LAB — GLUCOSE, CAPILLARY
GLUCOSE-CAPILLARY: 360 mg/dL — AB (ref 70–99)
GLUCOSE-CAPILLARY: 306 mg/dL — AB (ref 70–99)
Glucose-Capillary: 253 mg/dL — ABNORMAL HIGH (ref 70–99)
Glucose-Capillary: 317 mg/dL — ABNORMAL HIGH (ref 70–99)

## 2013-11-10 MED ORDER — INSULIN ASPART 100 UNIT/ML ~~LOC~~ SOLN
0.0000 [IU] | Freq: Three times a day (TID) | SUBCUTANEOUS | Status: DC
  Administered 2013-11-10: 11 [IU] via SUBCUTANEOUS
  Administered 2013-11-11 (×2): 8 [IU] via SUBCUTANEOUS
  Administered 2013-11-11: 11 [IU] via SUBCUTANEOUS
  Administered 2013-11-12 (×2): 5 [IU] via SUBCUTANEOUS
  Administered 2013-11-12: 11 [IU] via SUBCUTANEOUS
  Administered 2013-11-13 (×2): 5 [IU] via SUBCUTANEOUS
  Administered 2013-11-13: 8 [IU] via SUBCUTANEOUS
  Administered 2013-11-14: 3 [IU] via SUBCUTANEOUS
  Administered 2013-11-14: 8 [IU] via SUBCUTANEOUS
  Administered 2013-11-14: 3 [IU] via SUBCUTANEOUS
  Administered 2013-11-15: 5 [IU] via SUBCUTANEOUS
  Administered 2013-11-15: 11 [IU] via SUBCUTANEOUS
  Administered 2013-11-15: 2 [IU] via SUBCUTANEOUS
  Administered 2013-11-16 (×2): 5 [IU] via SUBCUTANEOUS
  Administered 2013-11-16: 8 [IU] via SUBCUTANEOUS
  Administered 2013-11-17: 5 [IU] via SUBCUTANEOUS

## 2013-11-10 MED ORDER — INSULIN ASPART 100 UNIT/ML ~~LOC~~ SOLN
4.0000 [IU] | Freq: Three times a day (TID) | SUBCUTANEOUS | Status: DC
  Administered 2013-11-10 – 2013-11-11 (×2): 4 [IU] via SUBCUTANEOUS

## 2013-11-10 NOTE — [facility] Counselor (Signed)
Adult Psychosocial Assessment Update Interdisciplinary Team  [facility] admissions/discharges:  Admissions Discharges  Date:  06/30/13 Date: 09/08/12  Date:  03/10/12 Date: 03/17/12  Date:  01/08/11 Date: 01/13/11  Date: 09/21/10 Date: 09/30/2010  Date: 06/06/2010 Date:   Changes since the last Psychosocial Assessment (including adherence to outpatient mental health and/or substance abuse treatment, situational issues contributing to decompensation and/or relapse). [name] reports that [facility], but does not know the reason why.   [place] go [place], and [facility] [place] [facility], then stayed with his cousin.  He does not want to go back to his cousin's due to cousin's substance use issues.  [name] states that he would like to go to a residential treatment facility.  He suggested trying for [facility] again or [facility] [place] [facility] [place].  He endorses smoking THC a month ago.  He denies any other substance/alcohol use.  He indicates that his support system consisted of the AA/NA meetings he attends in [place].                Discharge Plan 1. Will you be returning to the same living situation after discharge?   Yes: No:      If no, what is your plan?    No, pt would like go to a residential treatment facility.          2. Would you like a referral for services when you are discharged? Yes:     If yes, for what services?  No:       Yes, residential treatment facility.         Summary and Recommendations (to be completed by the evaluator) Reginal is a 53 YO AA male who presents with voices telling him to harm himself and others.  He is homeless and unemployment.  Would like a referral to a residential treatment facility.  He can benefit from crisis stabilization, therapeutic milieu, medication management, and referral for services.                         Signature:  Simona Huhina Yang, 11/10/2013 9:15  AM

## 2013-11-10 NOTE — Progress Notes (Signed)
Patient ID: Joseph LefortReginald Vasquez, male   DOB: 07/30/1960, 53 y.o.   MRN: [id number] D: patient presents with flat affect; depressed mood. He has been withdrawn and isolative to room.  Patient has minimal interaction with staff and others.  He rates his depression as a 10.  He denies any SI/HI/AVH at this time.  Patient states that he would like to go to long term treatment upon discharge.  He is compliant with his meds.  His CBGs have been running in the 300s at 1200 and 1700.  A: continue to monitor medication management and MD orders.  Safety checks completed every 15 minutes per protocol.  R: patient remains isolative with minimal interaction with others.

## 2013-11-10 NOTE — [facility] Suicide Risk Assessment (Signed)
[facility] INPATIENT:  Family/Significant Other Suicide Prevention Education  Suicide Prevention Education:  Patient Refusal for Family/Significant Other Suicide Prevention Education: The patient [name] has refused to provide written consent for family/significant other to be provided Family/Significant Other Suicide Prevention Education during admission and/or prior to discharge.  Physician notified.  Simona Huhina Joab Carden 11/10/2013, 3:31 PM

## 2013-11-10 NOTE — [facility] Group Notes (Signed)
[facility] LCSW Group Therapy  11/10/2013 1:15 PM   Type of Therapy:  Group Therapy  Participation Level:  Did Not Attend - pt sleeping in his room  Cashis Rill Horton, LCSW 11/10/2013 2:09 PM  
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
## 2013-11-10 NOTE — [facility] Suicide Risk Assessment (Signed)
   Nursing information obtained from:  Patient Demographic factors:  Male;Low socioeconomic status Current Mental Status:  Suicidal ideation indicated by patient Loss Factors:  Financial problems / change in socioeconomic status Historical Factors:  Family history of mental illness or substance abuse Risk Reduction Factors:  Living with another person, especially a relative;Positive social support;Positive therapeutic relationship Total Time spent with patient: 20 minutes  CLINICAL FACTORS:   Depression:   Anhedonia Comorbid alcohol abuse/dependence Hopelessness Impulsivity Insomnia Severe Alcohol/Substance Abuse/Dependencies Currently Psychotic  Psychiatric Specialty Exam: Physical Exam  Review of Systems  Constitutional: Negative.   HENT: Negative.   Eyes: Negative.   Respiratory: Negative.   Cardiovascular: Negative.   Gastrointestinal: Negative.   Genitourinary: Negative.   Musculoskeletal: Negative.   Skin: Negative.   Neurological: Negative.   Endo/Heme/Allergies: Negative.   Psychiatric/Behavioral: Positive for depression, suicidal ideas, hallucinations and substance abuse. The patient is nervous/anxious and has insomnia.     Blood pressure 116/77, pulse 85, temperature 97.6 F (36.4 C), temperature source Oral, resp. rate 20, height 5\' 9"  (1.753 m), weight 108.863 kg (240 lb).Body mass index is 35.43 kg/(m^2).  General Appearance: Disheveled  Eye Contact::  Minimal  Speech:  Clear and Coherent  Volume:  Decreased  Mood:  Depressed and Hopeless  Affect:  Flat  Thought Process:  Disorganized  Orientation:  Full (Time, Place, and Person)  Thought Content:  Delusions and Hallucinations: Auditory  Suicidal Thoughts:  Yes.  without intent/plan  Homicidal Thoughts:  No  Memory:  Immediate;   Fair Recent;   Fair Remote;   Fair  Judgement:  Poor  Insight:  Lacking  Psychomotor Activity:  Decreased  Concentration:  Fair  Recall:  [employer]Fair  Fund of Knowledge:Fair   Language: Good  Akathisia:  No  Handed:  Right  AIMS (if indicated):     Assets:  Communication Skills Desire for Improvement Physical Health  Sleep:  Number of Hours: 6.75   Musculoskeletal: Strength & Muscle Tone: within normal limits Gait & Station: normal Patient leans: N/A  COGNITIVE FEATURES THAT CONTRIBUTE TO RISK:  Closed-mindedness Polarized thinking    SUICIDE RISK:   Mild:  Suicidal ideation of limited frequency, intensity, duration, and specificity.  There are no identifiable plans, no associated intent, mild dysphoria and related symptoms, good self-control (both objective and subjective assessment), few other risk factors, and identifiable protective factors, including available and accessible social support.  PLAN OF CARE:1. Admit for crisis management and stabilization. 2. Medication management to reduce current symptoms to base line and improve the     patient's overall level of functioning 3. Treat health problems as indicated. 4. Develop treatment plan to decrease risk of relapse upon discharge and the need for     readmission. 5. Psycho-social education regarding relapse prevention and self care. 6. Health care follow up as needed for medical problems. 7. Restart home medications where appropriate.   I certify that inpatient services furnished can reasonably be expected to improve the patient's condition.  Thedore MinsMojeed Levette Paulick, MD 11/10/2013, 10:06 AM

## 2013-11-11 DIAGNOSIS — R45851 Suicidal ideations: Secondary | ICD-10-CM

## 2013-11-11 LAB — GLUCOSE, CAPILLARY
Glucose-Capillary: 273 mg/dL — ABNORMAL HIGH (ref 70–99)
Glucose-Capillary: 345 mg/dL — ABNORMAL HIGH (ref 70–99)
Glucose-Capillary: 277 mg/dL — ABNORMAL HIGH (ref 70–99)
Glucose-Capillary: 339 mg/dL — ABNORMAL HIGH (ref 70–99)

## 2013-11-11 MED ORDER — BENZTROPINE MESYLATE 0.5 MG PO TABS: 0.5000 mg | ORAL_TABLET | Freq: Two times a day (BID) | ORAL | Status: DC

## 2013-11-11 MED ORDER — BENZTROPINE MESYLATE 0.5 MG PO TABS
0.5000 mg | ORAL_TABLET | Freq: Two times a day (BID) | ORAL | Status: DC
  Administered 2013-11-11 – 2013-11-17 (×12): 0.5 mg via ORAL
  Filled 2013-11-11: qty 14
  Filled 2013-11-11 (×10): qty 1
  Filled 2013-11-11: qty 14
  Filled 2013-11-11 (×5): qty 1

## 2013-11-11 MED ORDER — HALOPERIDOL 5 MG PO TABS
5.0000 mg | ORAL_TABLET | Freq: Two times a day (BID) | ORAL | Status: DC
  Administered 2013-11-11 – 2013-11-17 (×13): 5 mg via ORAL
  Filled 2013-11-11: qty 14
  Filled 2013-11-11 (×6): qty 1
  Filled 2013-11-11: qty 14
  Filled 2013-11-11 (×9): qty 1

## 2013-11-11 MED ORDER — FLUOXETINE HCL 20 MG PO CAPS
20.0000 mg | ORAL_CAPSULE | Freq: Every day | ORAL | Status: DC
  Administered 2013-11-11 – 2013-11-14 (×4): 20 mg via ORAL
  Filled 2013-11-11 (×5): qty 1

## 2013-11-11 MED ORDER — BUSPIRONE HCL 10 MG PO TABS
10.0000 mg | ORAL_TABLET | Freq: Three times a day (TID) | ORAL | Status: DC
  Administered 2013-11-11 – 2013-11-17 (×18): 10 mg via ORAL
  Filled 2013-11-11 (×3): qty 1
  Filled 2013-11-11: qty 21
  Filled 2013-11-11 (×11): qty 1
  Filled 2013-11-11: qty 21
  Filled 2013-11-11: qty 1
  Filled 2013-11-11: qty 21
  Filled 2013-11-11 (×6): qty 1

## 2013-11-11 MED ORDER — INSULIN ASPART 100 UNIT/ML ~~LOC~~ SOLN
6.0000 [IU] | Freq: Three times a day (TID) | SUBCUTANEOUS | Status: DC
  Administered 2013-11-11 – 2013-11-16 (×16): 6 [IU] via SUBCUTANEOUS

## 2013-11-11 NOTE — Care Management Utilization Note (Signed)
   Per State Regulation 482.30  This chart was reviewed for necessity with respect to the patient's Admission/ Duration of stay.  Next review date: 11/14/13  Jeanell Mangan Morrison RN, BSN 

## 2013-11-11 NOTE — Progress Notes (Signed)
Patient ID: Joseph LefortReginald Vasquez, male   DOB: 02/06/1961, 53 y.o.   MRN: [id number] D. Patient presents with depressed, irritable mood, affect blunted. He reports his mood remains depressed, and continues to endorse vague suicidal thoughts. He is able to contract for safety. Patient has been isolative to room throughout most of shift and not forthcoming with Clinical research associatewriter. He did report increased anxiety today. Pt completed self inventory and rates mood at 10/10 on depression scale, 10 being worst depression 1 being least. A. Medications given as ordered. Support and encouragement provided. Discussed above information with Dr. Jannifer FranklinAkintayo and treatment team. R. Patient in no acute distress at this time. No further voiced concerns at this time. Will continue to monitor q 15 minutes for safety.

## 2013-11-11 NOTE — [facility] Group Notes (Signed)
[facility] LCSW Aftercare Discharge Planning Group Note   11/11/2013 10:30 AM  Participation Quality:  Active  Mood/Affect:  Flat  Depression Rating:    Anxiety Rating:    Thoughts of Suicide:  No Will you contract for safety?   NA  Current AVH:  No  Plan for Discharge/Comments:  [name] reports that he did not have a good night sleep and kept on tossing and turning.  He also reports having aches and pain all over his body this morning.  [facility] yesterday.  Will hopefully hear from them today.  If [facility] does not [facility], [name] would like to be connected to shelters in [place] or [address] [place].    Transportation Means: Unknown  Supports:  Unknown  Simona Huhina Denesia Donelan

## 2013-11-11 NOTE — Progress Notes (Signed)
[facility] Group Notes:  (Nursing/MHT/Case Management/Adjunct)  Date:  11/11/2013  Time:  8:00 p.m.   Type of Therapy:  Psychoeducational Skills  Participation Level:  Minimal  Participation Quality:  Attentive  Affect:  Flat  Cognitive:  Appropriate  Insight:  Lacking  Engagement in Group:  Resistant  Modes of Intervention:  Education  Summary of Progress/Problems: The patient arrived late for group this evening, but was appropriate. The patient mentioned that he felt sluggish at the outset of his day and felt progressively better as the day wore on. He also mentioned that his medication was changed today and that might have accounted for his sluggishness. As a goal for tomorrow, he intends to attend all of the groups.   Westly PamBenjamin S Tylin Force 11/11/2013, 9:34 PM

## 2013-11-11 NOTE — Progress Notes (Signed)
D   Pt in bed resting with eyes closed   No distress noted A  Will continue to monitor Q 15 min R   Pt safe at present

## 2013-11-11 NOTE — ED Provider Notes (Signed)
Medical screening examination/treatment/procedure(s) were performed by non-physician practitioner and as supervising physician I was immediately available for consultation/collaboration.   EKG Interpretation None       Juliet RudeNathan R. Rubin PayorPickering, MD 11/11/13 1102

## 2013-11-11 NOTE — Progress Notes (Signed)
Inpatient Diabetes Program Recommendations  AACE/ADA: New Consensus Statement on Inpatient Glycemic Control (2013)  Target Ranges:  Prepandial:   less than 140 mg/dL      Peak postprandial:   less than 180 mg/dL (1-2 hours)      Critically ill patients:  140 - 180 mg/dL   Reason for Visit: Diabetes Coordinator consult     Note: Received Diabetes Coordinator consult.  CBGs greater than 180 mg/dl.   Noted that Novolog meal coverage was increased to 6 units TID today at 1200.  Recommend increasing Levemir to 45 units daily.  Titrate as needed if blood sugars continue to be greater than 180 mg/dl.  Smith MinceKendra Njeri Vicente RN BSN CDE

## 2013-11-11 NOTE — Tx Team (Signed)
Date:11/11/2013  Progress in Treatment:  Attending groups: Yes  Participating in groups: Yes  Taking medication as prescribed: Yes  Tolerating medication: Yes  Family/Significant othe contact made: no contact yet. Working to get consent from patient Patient understands diagnosis: Yes, patient making efforts to resume his medication and going into long term treatment for SA Discussing patient identified problems/goals with staff: Yes  Medical problems stabilized or resolved: Yes  Denies suicidal/homicidal ideation: Yes Patient has not harmed self or Others: Yes   New problem(s) identified: none  Discharge Plan or Barriers:  Patient reports he is interested in following up with [facility] for long term treatment. He will also follow up with [facility] after treatment or if not accepted. Patient reports he has been homeless and open [facility], [facility], and [facility].  Additional comments: n/a   Reason for Continuation of Hospitalization:  Depression Medication Stabilization Withdrawal Suicidal ideation/safety   Estimated length of stay: 3-5 days     For review of initial/current patient goals, please see plan of care.  Attendees:  Attendees:  Patient:    Family:    Physician: Dr. Jannifer FranklinAkintayo, MD  11/11/2013 10:01 AM  Nursing: Onnie BoerJennifer Clark, RN Case manager     Clinical Social Worker Mordecai RasmussenHannah Aikam Hellickson, LCSW, MSW    Other: Simona Huhina Yang, MSW,    Other: Mardella LaymanLindsey, RN   Other: Maren ReamerBrittanyRN    Other: Vernona RiegerLaura NP     Mordecai RasmussenHannah Zondra Lawlor, LCSW, MSW   11/11/2013

## 2013-11-11 NOTE — Progress Notes (Addendum)
[facility] MD Progress Note  11/11/2013 10:26 AM [name]  MRN:  [id number] Subjective:   Patient states "I have been feeling so jittery. I still want to shoot myself and others. So many people have been messing with me. Some people broke into my Aunt's house and I think I know them. I hear voices telling me I'm worthless. I have been on Seroquel for a few months and I honestly don't see any improvement. Yesterday I even saw something on the floor moving around."   Objective:  Patient is visible on the unit and is attending the scheduled groups. Rates his depression at and anxiety at ten. [name] is endorsing increased symptoms of depression and psychosis. The patient expresses suicidal and homicidal thoughts. He is able to contract for safety in the hospital. Patient endorses numerous stressors including recent deaths and feeling people have been trying to cause him pain. His CBG's have been elevated with patient admitting to drinking soda and juice everyday. Patient was encouraged to increase his water intake and make careful meal selections. He is complaint with his medications. Patient's medications will be adjusted to address his psychiatric symptoms. The patient does appear very depressed during his assessment today. His UDS was positive for marijuana on admission.   Diagnosis:   DSM5: Total Time spent with patient: 30 minutes  AXIS I: Schizoaffective disorder, depressive type  Cannabis abuse AXIS II: Deferred  AXIS III:  Past Medical History   Diagnosis  Date   .  Diabetes mellitus    .  Bipolar affective disorder    .  Schizophrenia, schizo-affective    .  Hypertension    .  Diabetes mellitus  03/11/2012   .  Bipolar disorder  03/11/2012   .  Anxiety    .  Depression    .  Hepatitis  06/30/2013     Type C    AXIS IV: housing problems, problems related to legal system/crime and problems with access to health care services  AXIS V: 41-50 serious symptoms  ADL's:  Intact  Sleep:  Fair  Appetite:  Good  Suicidal Ideation:  SI to shoot himself Homicidal Ideation:  HI to shoot others but no specific target  AEB (as evidenced by):  Psychiatric Specialty Exam: Physical Exam  Review of Systems  Constitutional: Negative.   HENT: Negative.   Eyes: Negative.   Respiratory: Negative.   Cardiovascular: Negative.   Gastrointestinal: Negative.   Genitourinary: Negative.   Musculoskeletal: Negative.   Skin: Negative.   Neurological: Negative.   Endo/Heme/Allergies: Negative.   Psychiatric/Behavioral: Positive for depression, suicidal ideas, hallucinations and substance abuse. Negative for memory loss. The patient is nervous/anxious and has insomnia.     Blood pressure 120/81, pulse 80, temperature 98.3 F (36.8 C), temperature source Oral, resp. rate 16, height 5\' 9"  (1.753 m), weight 108.863 kg (240 lb).Body mass index is 35.43 kg/(m^2).  General Appearance: Disheveled  Eye Solicitor::  Fair  Speech:  Clear and Coherent  Volume:  Normal  Mood:  Anxious and Dysphoric  Affect:  Blunt  Thought Process:  Circumstantial  Orientation:  Full (Time, Place, and Person)  Thought Content:  Delusions and Hallucinations: Auditory  Suicidal Thoughts:  Yes.  with intent/plan  Homicidal Thoughts:  Yes.  with intent/plan  Memory:  Immediate;   Fair Recent;   Fair Remote;   Fair  Judgement:  Poor  Insight:  Lacking  Psychomotor Activity:  Decreased  Concentration:  Fair  Recall:  [employer] of  Knowledge:Fair  Language: Good  Akathisia:  No  Handed:  Right  AIMS (if indicated):     Assets:  Communication Skills Desire for Improvement Physical Health  Sleep:  Number of Hours: 6.5   Musculoskeletal: Strength & Muscle Tone: within normal limits Gait & Station: normal Patient leans: N/A  Current Medications: Current Facility-Administered Medications  Medication Dose Route Frequency Provider Last Rate Last Dose  . acetaminophen (TYLENOL) tablet 650 mg  650 mg Oral  Q6H PRN Shuvon Rankin, NP      . alum & mag hydroxide-simeth (MAALOX/MYLANTA) 200-200-20 MG/5ML suspension 30 mL  30 mL Oral Q4H PRN Shuvon Rankin, NP      . busPIRone (BUSPAR) tablet 10 mg  10 mg Oral TID Fransisca KaufmannLaura Davis, NP      . FLUoxetine (PROZAC) capsule 20 mg  20 mg Oral Daily Fransisca KaufmannLaura Davis, NP      . haloperidol (HALDOL) tablet 5 mg  5 mg Oral BID Fransisca KaufmannLaura Davis, NP      . insulin aspart (novoLOG) injection 0-15 Units  0-15 Units Subcutaneous TID WC Fransisca KaufmannLaura Davis, NP   8 Units at 11/11/13 [phone]  . insulin aspart (novoLOG) injection 4 Units  4 Units Subcutaneous TID WC Fransisca KaufmannLaura Davis, NP   4 Units at 11/11/13 [phone]  . insulin detemir (LEVEMIR) injection 40 Units  40 Units Subcutaneous QHS Shuvon Rankin, NP   40 Units at 11/10/13 2157  . magnesium hydroxide (MILK OF MAGNESIA) suspension 30 mL  30 mL Oral Daily PRN Shuvon Rankin, NP      . metFORMIN (GLUCOPHAGE) tablet 1,000 mg  1,000 mg Oral BID WC Shuvon Rankin, NP   1,000 mg at 11/11/13 0818    Lab Results:  Results for orders placed during the hospital encounter of 11/09/13 (from the past 48 hour(s))  GLUCOSE, CAPILLARY     Status: Abnormal   Collection Time    11/09/13  8:45 PM      Result Value Ref Range   Glucose-Capillary 284 (*) 70 - 99 mg/dL   Comment 1 Notify RN    GLUCOSE, CAPILLARY     Status: Abnormal   Collection Time    11/10/13  6:15 AM      Result Value Ref Range   Glucose-Capillary 253 (*) 70 - 99 mg/dL   Comment 1 Notify RN    GLUCOSE, CAPILLARY     Status: Abnormal   Collection Time    11/10/13 11:57 AM      Result Value Ref Range   Glucose-Capillary 360 (*) 70 - 99 mg/dL  GLUCOSE, CAPILLARY     Status: Abnormal   Collection Time    11/10/13  4:40 PM      Result Value Ref Range   Glucose-Capillary 317 (*) 70 - 99 mg/dL   Comment 1 Documented in Chart     Comment 2 Notify RN    GLUCOSE, CAPILLARY     Status: Abnormal   Collection Time    11/10/13  9:52 PM      Result Value Ref Range   Glucose-Capillary 306 (*) 70 -  99 mg/dL  GLUCOSE, CAPILLARY     Status: Abnormal   Collection Time    11/11/13  6:19 AM      Result Value Ref Range   Glucose-Capillary 273 (*) 70 - 99 mg/dL   Comment 1 Notify RN      Physical Findings: AIMS: Facial and Oral Movements Muscles of Facial Expression: None, normal Lips and Perioral Area: None,  normal Jaw: None, normal Tongue: None, normal,Extremity Movements Upper (arms, wrists, hands, fingers): None, normal Lower (legs, knees, ankles, toes): None, normal, Trunk Movements Neck, shoulders, hips: None, normal, Overall Severity Severity of abnormal movements (highest score from questions above): None, normal Incapacitation due to abnormal movements: None, normal Patient's awareness of abnormal movements (rate only patient's report): No Awareness, Dental Status Current problems with teeth and/or dentures?: No Does patient usually wear dentures?: No  CIWA:    COWS:     Treatment Plan Summary: Daily contact with patient to assess and evaluate symptoms and progress in treatment Medication management  Plan: 1. Continue crisis management and stabilization.  2. Medication management: D/C Seroquel and Cymbalta. Start Buspar 10 mg TID to decrease anxiety, Prozac 20 mg daily for depression, Haldol 5 mg BID for psychosis, Cogentin 0.5 mg BID for EPS prevention.  3. Encouraged patient to attend groups and participate in group counseling sessions and activities.  4. Discharge plan in progress.  5. Continue current treatment plan.  6. Address health issues: Vitals reviewed and stable. Continue Moderate Novolog SSI, Metformin 1,000 mg BID, Levemir 40 mg hs, and Novolog 6 units meal coverage for management of Type 2 Diabetes. CBG values have been elevated in the 200's.   Medical Decision Making Problem Points:  Established problem, worsening (2), Review of last therapy session (1) and Review of psycho-social stressors (1) Data Points:  Review or order clinical lab tests  (1) Review of medication regiment & side effects (2) Review of new medications or change in dosage (2)  I certify that inpatient services furnished can reasonably be expected to improve the patient's condition.   Fransisca KaufmannLaura Davis NP-C 11/11/2013, 10:26 AM  Patient seen, evaluated and I agree with notes by Nurse Practitioner. Thedore MinsMojeed Gabrian Hoque, MD

## 2013-11-11 NOTE — [facility] Group Notes (Signed)
[facility] LCSW Group Therapy  11/11/2013 11:30 AM  Type of Therapy:  Group Therapy   Participation Level:  Did not attend.    Modes of Intervention:  Discussion and Socialization   Summary of Progress/Problems:  Chaplain was here to lead a group on themes of hope and courage.   [name]   11/11/2013  11:30 AM

## 2013-11-12 DIAGNOSIS — R45850 Homicidal ideations: Secondary | ICD-10-CM

## 2013-11-12 LAB — GLUCOSE, CAPILLARY
GLUCOSE-CAPILLARY: 242 mg/dL — AB (ref 70–99)
GLUCOSE-CAPILLARY: 250 mg/dL — AB (ref 70–99)
Glucose-Capillary: 219 mg/dL — ABNORMAL HIGH (ref 70–99)
Glucose-Capillary: 315 mg/dL — ABNORMAL HIGH (ref 70–99)

## 2013-11-12 MED ORDER — INSULIN DETEMIR 100 UNIT/ML ~~LOC~~ SOLN
45.0000 [IU] | Freq: Every day | SUBCUTANEOUS | Status: DC
  Administered 2013-11-12 – 2013-11-16 (×5): 45 [IU] via SUBCUTANEOUS

## 2013-11-12 MED ORDER — TRAZODONE HCL 100 MG PO TABS
100.0000 mg | ORAL_TABLET | Freq: Every day | ORAL | Status: DC
  Administered 2013-11-12 – 2013-11-13 (×2): 100 mg via ORAL
  Filled 2013-11-12 (×4): qty 1

## 2013-11-12 NOTE — [facility] Group Notes (Signed)
[facility] LCSW Group Therapy  11/12/2013 12:50 PM  Type of Therapy:  Group Therapy  Participation Level:  Minimal  Participation Quality:  Attentive  Affect:  Depressed  Cognitive:  Alert and Oriented  Insight:  Improving  Engagement in Therapy:  Improving  Modes of Intervention:  Discussion, Exploration, Problem-solving, Rapport Building, Socialization and Support  Summary of Progress/Problems: The main focus of today's process group was to identify the patient's current support system and decide on other supports that can be put in place. An emphasis was placed on using counselor, doctor, therapy groups, 12-step groups, and problem-specific support groups to expand supports, as well as doing something different than has been done before.   [name] was observed initially to be in a reserved mood through out group as he refrained from providing his perspective towards supports within his life. As the discussion continued his affect and mood brightened as he identified his sister and his mother to be positive supports in his life. [name] provided the example of his sister attempting to assist him in receiving disability and being by his side throughout all of the paperwork and additional requirements. He shared that he is grateful for his family although he recognizes that he must change as well for the better in addition to receiving support from others.  Joseph KhanGregory C Pickett Jr. 11/12/2013, 12:50 PM

## 2013-11-12 NOTE — Progress Notes (Signed)
Patient ID: [name], male   DOB: [birth year], 53 y.o.   MRN: [id number] [facility] MD Progress Note  11/12/2013 4:54 PM [name]  MRN:  [id number] Subjective:   Patient states "I am feeling a little suicidal today. Not sleeping at night. Having AH off and on when alone".  Objective:  Patient is visible on the unit and is attending the scheduled groups. Rates his depression at and anxiety at #9. [name] is still endorsing symptoms of depression and psychosis. The patient expresses suicidal thoughts. He is able to contract for safety in the hospital. He says he is tossing and turning in bed all night long.  Diagnosis:   DSM5: Total Time spent with patient: 30 minutes  AXIS I: Schizoaffective disorder, depressive type  Cannabis abuse AXIS II: Deferred  AXIS III:  Past Medical History   Diagnosis  Date   .  Diabetes mellitus    .  Bipolar affective disorder    .  Schizophrenia, schizo-affective    .  Hypertension    .  Diabetes mellitus  03/11/2012   .  Bipolar disorder  03/11/2012   .  Anxiety    .  Depression    .  Hepatitis  06/30/2013     Type C    AXIS IV: housing problems, problems related to legal system/crime and problems with access to health care services  AXIS V: 41-50 serious symptoms  ADL's:  Intact  Sleep: Fair  Appetite:  Good  Suicidal Ideation:  SI to shoot himself Homicidal Ideation:  HI to shoot others but no specific target  AEB (as evidenced by):  Psychiatric Specialty Exam: Physical Exam  Review of Systems  Constitutional: Negative.   HENT: Negative.   Eyes: Negative.   Respiratory: Negative.   Cardiovascular: Negative.   Gastrointestinal: Negative.   Genitourinary: Negative.   Musculoskeletal: Negative.   Skin: Negative.   Neurological: Negative.   Endo/Heme/Allergies: Negative.   Psychiatric/Behavioral: Positive for depression, suicidal ideas, hallucinations and substance abuse. Negative for memory loss. The patient is nervous/anxious  and has insomnia.     Blood pressure 143/92, pulse 60, temperature 98.1 F (36.7 C), temperature source Oral, resp. rate 16, height 5\' 9"  (1.753 m), weight 108.863 kg (240 lb).Body mass index is 35.43 kg/(m^2).  General Appearance: Disheveled  Eye Solicitor::  Fair  Speech:  Clear and Coherent  Volume:  Normal  Mood:  Anxious and Dysphoric  Affect:  Blunt  Thought Process:  Circumstantial  Orientation:  Full (Time, Place, and Person)  Thought Content:  Delusions and Hallucinations: Auditory  Suicidal Thoughts:  Yes.  with intent/plan  Homicidal Thoughts:  Yes.  with intent/plan  Memory:  Immediate;   Fair Recent;   Fair Remote;   Fair  Judgement:  Poor  Insight:  Lacking  Psychomotor Activity:  Decreased  Concentration:  Fair  Recall:  [employer] of Knowledge:Fair  Language: Good  Akathisia:  No  Handed:  Right  AIMS (if indicated):     Assets:  Communication Skills Desire for Improvement Physical Health  Sleep:  Number of Hours: 6.25   Musculoskeletal: Strength & Muscle Tone: within normal limits Gait & Station: normal Patient leans: N/A  Current Medications: Current Facility-Administered Medications  Medication Dose Route Frequency Provider Last Rate Last Dose  . acetaminophen (TYLENOL) tablet 650 mg  650 mg Oral Q6H PRN Shuvon Rankin, NP      . alum & mag hydroxide-simeth (MAALOX/MYLANTA) 200-200-20 MG/5ML suspension 30 mL  30 mL Oral  Q4H PRN Shuvon Rankin, NP      . benztropine (COGENTIN) tablet 0.5 mg  0.5 mg Oral BID Fransisca KaufmannLaura Davis, NP   0.5 mg at 11/12/13 0810  . busPIRone (BUSPAR) tablet 10 mg  10 mg Oral TID Fransisca KaufmannLaura Davis, NP   10 mg at 11/12/13 1153  . FLUoxetine (PROZAC) capsule 20 mg  20 mg Oral Daily Fransisca KaufmannLaura Davis, NP   20 mg at 11/12/13 0810  . haloperidol (HALDOL) tablet 5 mg  5 mg Oral BID Fransisca KaufmannLaura Davis, NP   5 mg at 11/12/13 0810  . insulin aspart (novoLOG) injection 0-15 Units  0-15 Units Subcutaneous TID WC Fransisca KaufmannLaura Davis, NP   11 Units at 11/12/13 1154  . insulin  aspart (novoLOG) injection 6 Units  6 Units Subcutaneous TID WC Fransisca KaufmannLaura Davis, NP   6 Units at 11/12/13 1154  . insulin detemir (LEVEMIR) injection 45 Units  45 Units Subcutaneous QHS Vinay P Saranga, MD      . magnesium hydroxide (MILK OF MAGNESIA) suspension 30 mL  30 mL Oral Daily PRN Shuvon Rankin, NP      . metFORMIN (GLUCOPHAGE) tablet 1,000 mg  1,000 mg Oral BID WC Shuvon Rankin, NP   1,000 mg at 11/12/13 0809  . traZODone (DESYREL) tablet 100 mg  100 mg Oral QHS Sanjuana KavaAgnes I Chyanna Flock, NP        Lab Results:  Results for orders placed during the hospital encounter of 11/09/13 (from the past 48 hour(s))  GLUCOSE, CAPILLARY     Status: Abnormal   Collection Time    11/10/13  9:52 PM      Result Value Ref Range   Glucose-Capillary 306 (*) 70 - 99 mg/dL  GLUCOSE, CAPILLARY     Status: Abnormal   Collection Time    11/11/13  6:19 AM      Result Value Ref Range   Glucose-Capillary 273 (*) 70 - 99 mg/dL   Comment 1 Notify RN    GLUCOSE, CAPILLARY     Status: Abnormal   Collection Time    11/11/13 12:02 PM      Result Value Ref Range   Glucose-Capillary 345 (*) 70 - 99 mg/dL  GLUCOSE, CAPILLARY     Status: Abnormal   Collection Time    11/11/13  5:00 PM      Result Value Ref Range   Glucose-Capillary 277 (*) 70 - 99 mg/dL  GLUCOSE, CAPILLARY     Status: Abnormal   Collection Time    11/11/13  9:11 PM      Result Value Ref Range   Glucose-Capillary 339 (*) 70 - 99 mg/dL   Comment 1 Notify RN    GLUCOSE, CAPILLARY     Status: Abnormal   Collection Time    11/12/13  6:07 AM      Result Value Ref Range   Glucose-Capillary 219 (*) 70 - 99 mg/dL  GLUCOSE, CAPILLARY     Status: Abnormal   Collection Time    11/12/13 11:50 AM      Result Value Ref Range   Glucose-Capillary 315 (*) 70 - 99 mg/dL    Physical Findings: AIMS: Facial and Oral Movements Muscles of Facial Expression: None, normal Lips and Perioral Area: None, normal Jaw: None, normal Tongue: None, normal,Extremity  Movements Upper (arms, wrists, hands, fingers): None, normal Lower (legs, knees, ankles, toes): None, normal, Trunk Movements Neck, shoulders, hips: None, normal, Overall Severity Severity of abnormal movements (highest score from questions above): None, normal Incapacitation due  to abnormal movements: None, normal Patient's awareness of abnormal movements (rate only patient's report): No Awareness, Dental Status Current problems with teeth and/or dentures?: No Does patient usually wear dentures?: No  CIWA:  CIWA-Ar Total: 6 COWS:  COWS Total Score: 7  Treatment Plan Summary: Daily contact with patient to assess and evaluate symptoms and progress in treatment Medication management  Plan: 1. Continue crisis management and stabilization.  2. Medication management: Continue Buspar 10 mg TID to decrease anxiety, Prozac 20 mg daily for depression, Haldol 5 mg BID for psychosis, Cogentin 0.5 mg BID for EPS prevention, add Trazodone 100 mg  For sleep.  3. Encouraged patient to attend groups and participate in group counseling sessions and activities.  4. Discharge plan in progress.  5. Continue current treatment plan.  6. Address health issues: Vitals reviewed and stable. Continue Moderate Novolog SSI, Metformin 1,000 mg BID, Levemir 40 mg hs, and Novolog 6 units meal coverage for management of Type 2 Diabetes. CBG values have been elevated in the 200's.   Medical Decision Making Problem Points:  Established problem, worsening (2), Review of last therapy session (1) and Review of psycho-social stressors (1) Data Points:  Review or order clinical lab tests (1) Review of medication regiment & side effects (2) Review of new medications or change in dosage (2)  I certify that inpatient services furnished can reasonably be expected to improve the patient's condition.   Sanjuana Kavagnes I Parker Wherley NP-C 11/12/2013, 4:54 PM

## 2013-11-12 NOTE — Progress Notes (Signed)
Patient ID: [name], male   DOB: 1960-09-17, 53 y.o.   MRN: [id number] D. The patient had a depressed mood and flat affect. Interacting appropriately in the milieu. He stated that his medication is making him very tired and he was having difficulty staying awake during the day. A. Awakened and encouraged to attend evening group. Met with patient after group to assess. Reviewed CBG results and administered HS Insulin. R. The patient arrived late to group. Participated in group discussion. Denied suicidal ideation. Is able to verbally contract for safety.

## 2013-11-12 NOTE — Progress Notes (Signed)
Patient ID: [name], male   DOB: Aug 24, 1960, 53 y.o.   MRN: [id number] D. The patient somewhat irritable this evening. Continues to c/o poor sleep. Reports he is still experiencing auditory hallucinations. Has some insight regarding his medication. Stated that he knows he needs to keep taking it. Shared that in the past when he started feeling better he would stop taking his medication, " And that is when the walls come tumbling down". His plan to prevent relapse is to stay on his medication. A. Met with patient to assess. Verbal support and encouragement provided. Reviewed and discussed HS sleep medication with the patient. Monitored CBG. R. The patient attended and actively participated in evening group. Some passive suicidal ideation without a plan. The patient is able to contract for safety.

## 2013-11-12 NOTE — Progress Notes (Signed)
Patient ID: Joseph LefortReginald Vasquez, male   DOB: 02/26/1961, 53 y.o.   MRN: [id number] D. Patient presents with depressed, irritable mood, affect blunted again today. [name] continues to report poor sleep, depressed mood, and auditory hallucinations.  [name] also states he continues to have vague SI , denies any plan and contracts for safety. Joseph BashReginald is not forthcoming with staff, but he states '' I'm tired, I didn't sleep last night I've been up all night and the doctor didn't give me anything to help me sleep . I used to take Seroquel 300mg . Patient has been isolative to room throughout most of shift,. Minimal interaction with peers.   Pt completed self inventory and rates mood at 10/10 on depression scale, 10 being worst depression 1 being least again today. A. Medications given as ordered. Support and encouragement provided. Discussed above information with Dr. Tawni Vasquez. R. Patient in no acute distress at this time. No further voiced concerns at this time. Will continue to monitor q 15 minutes for safety.

## 2013-11-12 NOTE — Progress Notes (Signed)
Patient ID: Andre LefortReginald Hiser, male   DOB: 06/06/1961, 53 y.o.   MRN: [id number] Psychoeducational Group Note  Date:  11/12/2013 Time:0920am  Group Topic/Focus:  Identifying Needs:   The focus of this group is to help patients identify their personal needs that have been historically problematic and identify healthy behaviors to address their needs.  Participation Level:  Active  Participation Quality:  Appropriate  Affect:  Appropriate  Cognitive:  Appropriate  Insight:  Supportive  Engagement in Group:  Supportive  Additional Comments:  Healthy coping skills.   Valente DavidWeaver, Sharian Delia Brooks 11/12/2013,12:24 PM

## 2013-11-12 NOTE — Progress Notes (Signed)
Patient ID: Joseph LefortReginald Both, male   DOB: 02/27/1961, 53 y.o.   MRN: [id number] Psychoeducational Group Note  Date:  11/12/2013 Time:0900am  Group Topic/Focus:  Identifying Needs:   The focus of this group is to help patients identify their personal needs that have been historically problematic and identify healthy behaviors to address their needs.  Participation Level:  Active  Participation Quality:  Appropriate  Affect:  Appropriate  Cognitive:  Appropriate  Insight:  Supportive  Engagement in Group:  Supportive  Additional Comments:  Inventory group   Valente DavidWeaver, Ranette Luckadoo Brooks 11/12/2013,12:24 PM

## 2013-11-12 NOTE — Progress Notes (Signed)
[facility] Group Notes:  (Nursing/MHT/Case Management/Adjunct)  Date:  11/12/2013  Time:  8:00 p.m.   Type of Therapy:  Psychoeducational Skills  Participation Level:  Active  Participation Quality:  Attentive  Affect:  Flat  Cognitive:  Appropriate  Insight:  Good  Engagement in Group:  Engaged  Modes of Intervention:  Education  Summary of Progress/Problems: The patient shared in group that he had a good day despite having a poor night's sleep last evening. He states that he spent some time speaking with his peers about "recovery" and some of his suggestions. The patient admitted to occasionally stopping his medications once he begins to feel well, but this leads to negative consequences. As for the theme for the day, his coping skills will include taking his medication as prescribed and attending meetings.   Westly PamBenjamin S Manilla Strieter 11/12/2013, 9:07 PM

## 2013-11-13 LAB — GLUCOSE, CAPILLARY
GLUCOSE-CAPILLARY: 275 mg/dL — AB (ref 70–99)
Glucose-Capillary: 233 mg/dL — ABNORMAL HIGH (ref 70–99)
Glucose-Capillary: 237 mg/dL — ABNORMAL HIGH (ref 70–99)
Glucose-Capillary: 171 mg/dL — ABNORMAL HIGH (ref 70–99)

## 2013-11-13 NOTE — [facility] Group Notes (Signed)
[facility] LCSW Group Therapy  11/13/2013 11:15 AM  Type of Therapy:  Group Therapy  Participation Level:  Active  Participation Quality:  Appropriate  Affect:  Flat  Cognitive:  Alert and Oriented  Insight:  Developing/Improving  Engagement in Therapy:  Developing/Improving  Modes of Intervention:  Clarification, Discussion, Rapport Building, Socialization and Support  Summary of Progress/Problems: Group focus today was on self care. There was discussion on what individual group members saw as self care and patient were then given opportunity to identify an area they could focus on upon discharge. [name] shared that he is looking forward to re-experiencing sobriety.  [name] shared that he acknowledges his tendency to isolate and thus plans to attend either an AA or NA meeting at least once daily to avoid isolation.   [name]

## 2013-11-13 NOTE — Progress Notes (Signed)
Adult Psychoeducational Group Note  Date:  11/13/2013 Time:  9:40 PM  Group Topic/Focus:  Wrap-Up Group:   The focus of this group is to help patients review their daily goal of treatment and discuss progress on daily workbooks.  Participation Level:  Active  Participation Quality:  Appropriate  Affect:  Appropriate  Cognitive:  Appropriate  Insight: Appropriate  Engagement in Group:  Engaged  Modes of Intervention:  Discussion  Additional Comments: The patient expressed that he learn from group to care about your appearance and have a positive attitude.  Laneta Simmersrthur L Zaydon Kinser 11/13/2013, 9:40 PM

## 2013-11-13 NOTE — Progress Notes (Signed)
Patient ID: Joseph LefortReginald Vasquez, male   DOB: 08/08/1960, 53 y.o.   MRN: [id number] [facility]  11/13/2013 2:04 PM Joseph LefortReginald Vasquez  MRN:  [id number] Subjective:   Pt reports poor sleep. Pt reports continued SI, but no current plan/intent. Pt reports AH stating "I'm worthless, I should hurt myself". His mood is "lousy". He reports decreased appetite, and does not feel that his meds are effective.  Objective:  Patient is visible on the unit and is attending the scheduled groups. Rates his depression at and anxiety at #9. [name] is still endorsing symptoms of depression and psychosis. The patient expresses suicidal thoughts. He is able to contract for safety in the hospital. He says he is tossing and turning in bed all night long.  Diagnosis:   DSM5: Total Time spent with patient: 15 minutes  AXIS I: Schizoaffective disorder, depressive type  Cannabis abuse AXIS II: Deferred  AXIS III:  Past Medical History   Diagnosis  Date   .  Diabetes mellitus    .  Bipolar affective disorder    .  Schizophrenia, schizo-affective    .  Hypertension    .  Diabetes mellitus  03/11/2012   .  Bipolar disorder  03/11/2012   .  Anxiety    .  Depression    .  Hepatitis  06/30/2013     Type C    AXIS IV: housing problems, problems related to legal system/crime and problems with access to health care services  AXIS V: 41-50 serious symptoms  ADL's:  Intact  Sleep: Fair  Appetite:  Good  Suicidal Ideation:  SI, no plan Homicidal Ideation:  denies AEB (as evidenced by):  Psychiatric Specialty Exam: Physical Exam  Review of Systems  Constitutional: Negative.   HENT: Negative.   Eyes: Negative.   Respiratory: Negative.   Cardiovascular: Negative.   Gastrointestinal: Negative.   Genitourinary: Negative.   Musculoskeletal: Negative.   Skin: Negative.   Neurological: Negative.   Endo/Heme/Allergies: Negative.   Psychiatric/Behavioral: Positive for depression, suicidal ideas, hallucinations  and substance abuse. Negative for memory loss. The patient is nervous/anxious and has insomnia.     Blood pressure 143/92, pulse 60, temperature 98.1 F (36.7 C), temperature source Oral, resp. rate 16, height 5\' 9"  (1.753 m), weight 108.863 kg (240 lb).Body mass index is 35.43 kg/(m^2).  General Appearance: Disheveled  Eye SolicitorContact::  Fair  Speech:  Clear and Coherent  Volume:  Normal  Mood:  Anxious and Dysphoric  Affect:  Blunt  Thought Process:  Circumstantial  Orientation:  Full (Time, Place, and Person)  Thought Content:  Delusions and Hallucinations: Auditory  Suicidal Thoughts:  Yes.  without intent/plan  Homicidal Thoughts:  No  Memory:  Immediate;   Fair Recent;   Fair Remote;   Fair  Judgement:  Poor  Insight:  Lacking  Psychomotor Activity:  Decreased  Concentration:  Fair  Recall:  FiservFair  Fund of Knowledge:Fair  Language: Good  Akathisia:  No  Handed:  Right  AIMS (if indicated):     Assets:  Communication Skills Desire for Improvement Physical Health  Sleep:  Number of Hours: 6.5   Musculoskeletal: Strength & Muscle Tone: within normal limits Gait & Station: normal Patient leans: N/A  Current Medications: Current Facility-Administered Medications  Medication Dose Route Frequency Provider Last Rate Last Dose  . acetaminophen (TYLENOL) tablet 650 mg  650 mg Oral Q6H PRN Shuvon Rankin, NP      . alum & mag hydroxide-simeth (MAALOX/MYLANTA) 200-200-20 MG/5ML  suspension 30 mL  30 mL Oral Q4H PRN Shuvon Rankin, NP      . benztropine (COGENTIN) tablet 0.5 mg  0.5 mg Oral BID Laura Davis, NP   0.5 mg at 11/13/13 0803  . busPIRone (BUSPAR) tablet 10 mg  10 mg Oral TID Fransisca KaufmannLaura DaviFransisca Kaufmanns, NP   10 mg at 11/13/13 1203  . FLUoxetine (PROZAC) capsule 20 mg  20 mg Oral Daily Fransisca KaufmannLaura Davis, NP   20 mg at 11/13/13 [id number]  . haloperidol (HALDOL) tablet 5 mg  5 mg Oral BID Fransisca KaufmannLaura Davis, NP   5 mg at 11/13/13 0803  . insulin aspart (novoLOG) injection 0-15 Units  0-15 Units Subcutaneous  TID WC Fransisca KaufmannLaura Davis, NP   5 Units at 11/13/13 1206  . insulin aspart (novoLOG) injection 6 Units  6 Units Subcutaneous TID WC Fransisca KaufmannLaura Davis, NP   6 Units at 11/13/13 1206  . insulin detemir (LEVEMIR) injection 45 Units  45 Units Subcutaneous QHS Caprice KluverVinay P Kaleo Condrey, MD   45 Units at 11/12/13 2127  . magnesium hydroxide (MILK OF MAGNESIA) suspension 30 mL  30 mL Oral Daily PRN Shuvon Rankin, NP      . metFORMIN (GLUCOPHAGE) tablet 1,000 mg  1,000 mg Oral BID WC Shuvon Rankin, NP   1,000 mg at 11/13/13 0803  . traZODone (DESYREL) tablet 100 mg  100 mg Oral QHS Sanjuana KavaAgnes I Nwoko, NP   100 mg at 11/12/13 2128    Lab Results:  Results for orders placed during the hospital encounter of 11/09/13 (from the past 48 hour(s))  GLUCOSE, CAPILLARY     Status: Abnormal   Collection Time    11/11/13  5:00 PM      Result Value Ref Range   Glucose-Capillary 277 (*) 70 - 99 mg/dL  GLUCOSE, CAPILLARY     Status: Abnormal   Collection Time    11/11/13  9:11 PM      Result Value Ref Range   Glucose-Capillary 339 (*) 70 - 99 mg/dL   Comment 1 Notify RN    GLUCOSE, CAPILLARY     Status: Abnormal   Collection Time    11/12/13  6:07 AM      Result Value Ref Range   Glucose-Capillary 219 (*) 70 - 99 mg/dL  GLUCOSE, CAPILLARY     Status: Abnormal   Collection Time    11/12/13 11:50 AM      Result Value Ref Range   Glucose-Capillary 315 (*) 70 - 99 mg/dL  GLUCOSE, CAPILLARY     Status: Abnormal   Collection Time    11/12/13  4:55 PM      Result Value Ref Range   Glucose-Capillary 242 (*) 70 - 99 mg/dL  GLUCOSE, CAPILLARY     Status: Abnormal   Collection Time    11/12/13  8:59 PM      Result Value Ref Range   Glucose-Capillary 250 (*) 70 - 99 mg/dL  GLUCOSE, CAPILLARY     Status: Abnormal   Collection Time    11/13/13  6:18 AM      Result Value Ref Range   Glucose-Capillary 233 (*) 70 - 99 mg/dL  GLUCOSE, CAPILLARY     Status: Abnormal   Collection Time    11/13/13 11:48 AM      Result Value Ref Range    Glucose-Capillary 237 (*) 70 - 99 mg/dL    Physical Findings: AIMS: Facial and Oral Movements Muscles of Facial Expression: None, normal Lips and Perioral Area: None, normal  Jaw: None, normal Tongue: None, normal,Extremity Movements Upper (arms, wrists, hands, fingers): None, normal Lower (legs, knees, ankles, toes): None, normal, Trunk Movements Neck, shoulders, hips: None, normal, Overall Severity Severity of abnormal movements (highest score from questions above): None, normal Incapacitation due to abnormal movements: None, normal Patient's awareness of abnormal movements (rate only patient's report): No Awareness, Dental Status Current problems with teeth and/or dentures?: No Does patient usually wear dentures?: No  CIWA:  CIWA-Ar Total: 6 COWS:  COWS Total Score: 7  Treatment Plan Summary: Daily contact with patient to assess and evaluate symptoms and progress in treatment Medication management  Plan: 1. Continue crisis management and stabilization.  2. Medication management: Continue Buspar 10 mg TID to decrease anxiety, Prozac 20 mg daily for depression, Haldol 5 mg BID for psychosis, Cogentin 0.5 mg BID for EPS prevention, Trazodone 100 mg  For sleep.  3. Encouraged patient to attend groups and participate in group counseling sessions and activities.  4. Discharge plan in progress.  5. Continue current treatment plan.  6. Address health issues: Vitals reviewed and stable. Continue Moderate Novolog SSI, Metformin 1,000 mg BID, Levemir 40 mg hs, and Novolog 6 units meal coverage for management of Type 2 Diabetes. CBG values have been elevated in the 200's.   Medical Decision Making Problem Points:  Established problem, worsening (2), Review of last therapy session (1) and Review of psycho-social stressors (1) Data Points:  Review or order clinical lab tests (1) Review of medication regiment & side effects (2) Review of new medications or change in dosage (2)  I certify  that inpatient services furnished can reasonably be expected to improve the patient's condition.   Caprice Kluver MD  11/13/2013, 2:04 PM

## 2013-11-13 NOTE — Progress Notes (Signed)
Patient ID: Joseph LefortReginald Vasquez, male   DOB: 05/06/1961, 53 y.o.   MRN: [id number] D. Patient presents with depressed, irritable mood, affect blunted again today. [name] continues to report poor disrupted sleep, but reports some improvements with trazodone. [name] also states he continues to have vague SI , denies any plan and contracts for safety. Joseph BashReginald remains irritable, quiet, but is cooperative with staff and unit programming. Minimal interaction with peers. Pt completed self inventory and rates mood at 10/10 on depression scale, 10 being worst depression 1 being least again today. Pt reports little improvement with mood and depression. [name] also continues to have elevated CBG, despite education from staff regarding diabetic diet. A. Medications given as ordered. Support and encouragement provided. Discussed above information with Dr. Tawni CarnesSaranga. R. Patient in no acute distress at this time. No further voiced concerns at this time. Will continue to monitor q 15 minutes for safety.

## 2013-11-13 NOTE — [facility] Group Notes (Signed)
[facility] Group Notes:  (Nursing/MHT/Case Management/Adjunct)  Date:  11/13/2013  Time:  0930  Type of Therapy:  Psychoeducational Skills--Healthy Support Systems  Participation Level:  Minimal  Participation Quality:  Resistant  Affect:  Flat and Irritable  Cognitive:  Appropriate  Insight:  Limited  Engagement in Group:  Limited  Modes of Intervention:  Discussion, Education and Exploration  Summary of Progress/Problems: Focus of this group was on healthy support systems, assisting patients in identifying prior unhealthy supports, to replace with healthy ones.   Malva LimesLinsey Nyeemah Jennette 11/13/2013, 10:53 AM

## 2013-11-13 NOTE — [facility] Group Notes (Signed)
[facility] Group Notes:  (Nursing/MHT/Case Management/Adjunct)  Date:  11/13/2013  Time:  10:52 AM  Type of Therapy:  Psychoeducational Skills-Self Inventory Review with RN   Participation Level:  Minimal  Participation Quality:  Resistant  Affect:  Flat and Irritable  Cognitive:  Appropriate  Insight:  Limited  Engagement in Group:  Limited  Modes of Intervention:  Discussion, Education and Exploration  Summary of Progress/Problems: self inventory review with RN  [name] 11/13/2013, 10:52 AM

## 2013-11-13 NOTE — Progress Notes (Signed)
Adult Psychoeducational Group Note  Date:  11/13/2013 Time:  4:20 PM  Group Topic/Focus:  Managing Feelings:   The focus of this group is to identify what feelings patients have difficulty handling and develop a plan to handle them in a healthier way upon discharge.  Participation Level:  Active  Participation Quality:  Appropriate  Affect:  Appropriate  Cognitive:  Alert and Appropriate  Insight: Appropriate and Good  Engagement in Group:  Engaged and Supportive  Modes of Intervention:  Activity and Support  Additional Comments:  PT did well in group   Arcola Freshour R Gil Ingwersen 11/13/2013, 4:20 PM

## 2013-11-14 LAB — GLUCOSE, CAPILLARY
GLUCOSE-CAPILLARY: 177 mg/dL — AB (ref 70–99)
GLUCOSE-CAPILLARY: 264 mg/dL — AB (ref 70–99)
Glucose-Capillary: 251 mg/dL — ABNORMAL HIGH (ref 70–99)
Glucose-Capillary: 211 mg/dL — ABNORMAL HIGH (ref 70–99)

## 2013-11-14 MED ORDER — TRAZODONE HCL 50 MG PO TABS
50.0000 mg | ORAL_TABLET | Freq: Every evening | ORAL | Status: DC | PRN
  Administered 2013-11-15: 50 mg via ORAL
  Filled 2013-11-14: qty 7

## 2013-11-14 MED ORDER — PRAZOSIN HCL 5 MG PO CAPS
5.0000 mg | ORAL_CAPSULE | Freq: Every day | ORAL | Status: DC
  Administered 2013-11-14 – 2013-11-16 (×3): 5 mg via ORAL
  Filled 2013-11-14 (×4): qty 1
  Filled 2013-11-14: qty 7

## 2013-11-14 MED ORDER — FLUOXETINE HCL 20 MG PO CAPS
40.0000 mg | ORAL_CAPSULE | Freq: Every day | ORAL | Status: DC
  Administered 2013-11-15 – 2013-11-17 (×3): 40 mg via ORAL
  Filled 2013-11-14 (×4): qty 2
  Filled 2013-11-14: qty 14

## 2013-11-14 NOTE — [facility] Group Notes (Signed)
[facility] LCSW Group Therapy  11/14/2013 1:15 pm  Type of Therapy: Process Group Therapy  Participation Level:  Active  Participation Quality:  Appropriate  Affect:  Flat  Cognitive:  Oriented  Insight:  Improving  Engagement in Group:  Limited  Engagement in Therapy:  Limited  Modes of Intervention:  Activity, Clarification, Education, Problem-solving and Support  Summary of Progress/Problems: Today's group addressed the issue of overcoming obstacles.  Patients were asked to identify their biggest obstacle post d/c that stands in the way of their on-going success, and then problem solve as to how to manage this.  [name] was silent through most of group, but appeared to be taking it all in.  He stated that his biggest challenge is when he gets his check at the beginning of the month.  "I can do OK for 1 or 2 months, but than that 3rd month I blow it all immediately on cocaine."  He confirmed that is his current situation.  Others suggested a payee, but he was unenthusiastic about this solution.  Is focused on getting into rehab.  [facility] found out that [facility] is not an option.  Ida Rogueorth, Brittnay Pigman B 11/14/2013   3:23 PM

## 2013-11-14 NOTE — Progress Notes (Signed)
NSG shift assessment. 7a-7p.  D: Affect blunted, mood depressed, behavior appropriate. Pt is quiet and somewhat guarded. Reports poor sleep and wants something other that Trazodone. States that racing thoughts and nightmares keep him awake. Complained of pain in left side, but did not want any intervention. Does not know why his left side hurts. Attends groups and participates. Cooperative with staff and is getting along well with peers.  A: Observed pt interacting in group and in the milieu: Support and encouragement offered. Safety maintained with observations every 15 minutes.  R:  Reports that he feels like hurting himself off and on today, but contracts for safety and continues to follow the treatment plan, working on learning new coping skills.

## 2013-11-14 NOTE — Progress Notes (Signed)
D:  Patient up and present in the milieu, but quiet and with minimal interaction.  His appetite is good.  He is interacting some with peers.  Denies suicidal thoughts at this time.   A:  Medications given as prescribed.  Offered support and encouragement.   R:  Cooperative with staff.  Interacting some with peers.  Tolerating medications.  Safety is maintained on the unit.

## 2013-11-14 NOTE — Progress Notes (Signed)
The focus of this group is to help patients review their daily goal of treatment and discuss progress on daily workbooks.  Pt did not attend the evening group session. 

## 2013-11-14 NOTE — [facility] Group Notes (Signed)
[facility]   11/14/2013 11:02 AM  Participation Quality:  Engaged  Mood/Affect:  Depressed  Depression Rating:  10  Anxiety Rating:  10  Thoughts of Suicide:  Yes Will you contract for safety?   Yes  Current AVH:  Yes  Plan for Discharge/Comments:  [name] presents as depressed, despondent.  States [place] was staying in [place] previous to this, "near my mother,"  But does not plan to return there.  Hopes to get into long term treatment from here.  Stated that someone had talked to him about [facility] on Fri, and he is hoping to get in there.  His plan after that would be to get into another treatment center from there.  His UDS was positive only for Cannabis.  CSW to follow up with him on other options.   Transportation Means: unk  Supports: unk   Ida Rogueodney B Govani Radloff

## 2013-11-14 NOTE — Progress Notes (Signed)
Patient ID: [name], male   DOB: Nov 04, 1960, 53 y.o.   MRN: [id number] D. The patient has a depressed mood and flat affect. He interacts appropriately in the milieu. He reported an improvement in his sleeping, but shares that he still wakes up frequently throughout the night. A. Met with patient to assess. Discussed some strategies to improve sleep. Reviewed Insulin dosage and diet with the patient. Administered HS medication. R. The patient attended and participated in evening group. Stated that he is still experiencing auditory hallucinations. Admits to occasionally having some passive suicidal thoughts, but denies any at present. Verbally contracts for safety. CBG=171.

## 2013-11-14 NOTE — Care Management Utilization Note (Signed)
   Per State Regulation 482.30  This chart was reviewed for necessity with respect to the patient's Admission/ Duration of stay.  Next review date: 11/17/13  Erynne Kealey Morrison RN, BSN 

## 2013-11-14 NOTE — Progress Notes (Signed)
Patient ID: Joseph LefortReginald Vasquez, male   DOB: 09/23/1960, 53 y.o.   MRN: [id number] [facility]  11/14/2013 10:43 AM [name]  MRN:  [id number] Subjective: "I have not been sleeping since I came here due to racing thoughts, nightmares and vivid dreams.'' Pt reports poor sleep. Pt reports continued SI, but no current plan/intent. Pt reports AH stating "I'm worthless, I should hurt myself". His mood is "lousy". He reports decreased appetite, and does not feel that his meds are effective.  Objective:  Patient is seen and chart is reviewed. He is endorsing ongoing depression characterized by feeling hopeless, worthless, having difficulty sleeping, anxious, fatigue and lack of motivation. He rates his depression and anxiety at 8/10. He reports hearing voices telling him to hurt himself and saying that he is worthless. He continues to expresses suicidal thoughts but has no specific plan and able to contract for safety. He is compliant with his medications but requesting to be placed on a medication that will help him with nightmares and sleep. Diagnosis:   DSM5: Total Time spent with patient: 20 minutes  AXIS I: Schizoaffective disorder, depressive type  Cannabis abuse AXIS II: Deferred  AXIS III:  Past Medical History   Diagnosis  Date   .  Diabetes mellitus    .  Hypertension    .  Diabetes mellitus  03/11/2012   .  Hepatitis  06/30/2013     Type C    AXIS IV: housing problems, problems related to legal system/crime and problems with access to health care services  AXIS V: 41-50 moderate symptoms  ADL's:  Intact  Sleep: Fair  Appetite:  Good  Suicidal Ideation:  SI, no plan Homicidal Ideation:  denies AEB (as evidenced by):  Psychiatric Specialty Exam: Physical Exam  Review of Systems  Constitutional: Negative.   HENT: Negative.   Eyes: Negative.   Respiratory: Negative.   Cardiovascular: Negative.   Gastrointestinal: Negative.   Genitourinary: Negative.    Musculoskeletal: Negative.   Skin: Negative.   Neurological: Negative.   Endo/Heme/Allergies: Negative.   Psychiatric/Behavioral: Positive for depression, suicidal ideas, hallucinations and substance abuse. Negative for memory loss. The patient is nervous/anxious and has insomnia.     Blood pressure 123/84, pulse 71, temperature 97.8 F (36.6 C), temperature source Oral, resp. rate 16, height 5\' 9"  (1.753 m), weight 108.863 kg (240 lb).Body mass index is 35.43 kg/(m^2).  General Appearance: Disheveled  Eye SolicitorContact::  Fair  Speech:  Clear and Coherent  Volume:  Normal  Mood:  Anxious and Dysphoric  Affect:  Blunt  Thought Process:  Circumstantial  Orientation:  Full (Time, Place, and Person)  Thought Content:  Delusions and Hallucinations: Auditory  Suicidal Thoughts:  Yes.  without intent/plan  Homicidal Thoughts:  No  Memory:  Immediate;   Fair Recent;   Fair Remote;   Fair  Judgement:  Poor  Insight:  Lacking  Psychomotor Activity:  Decreased  Concentration:  Fair  Recall:  [employer]  Fund of Knowledge:Fair  Language: Good  Akathisia:  No  Handed:  Right  AIMS (if indicated):     Assets:  Communication Skills Desire for Improvement Physical Health  Sleep:  Number of Hours: 6.75   Musculoskeletal: Strength & Muscle Tone: within normal limits Gait & Station: normal Patient leans: N/A  Current Medications: Current Facility-Administered Medications  Medication Dose Route Frequency Provider Last Rate Last Dose  . acetaminophen (TYLENOL) tablet 650 mg  650 mg Oral Q6H PRN Shuvon Rankin, NP      .  alum & mag hydroxide-simeth (MAALOX/MYLANTA) 200-200-20 MG/5ML suspension 30 mL  30 mL Oral Q4H PRN Shuvon Rankin, NP      . benztropine (COGENTIN) tablet 0.5 mg  0.5 mg Oral BID Fransisca Kaufmann, NP   0.5 mg at 11/14/13 0805  . busPIRone (BUSPAR) tablet 10 mg  10 mg Oral TID Fransisca Kaufmann, NP   10 mg at 11/14/13 0805  . [START ON 11/15/2013] FLUoxetine (PROZAC) capsule 40 mg  40 mg Oral  Daily Zahari Fazzino      . haloperidol (HALDOL) tablet 5 mg  5 mg Oral BID Fransisca Kaufmann, NP   5 mg at 11/14/13 0805  . insulin aspart (novoLOG) injection 0-15 Units  0-15 Units Subcutaneous TID WC Fransisca Kaufmann, NP   8 Units at 11/14/13 0640  . insulin aspart (novoLOG) injection 6 Units  6 Units Subcutaneous TID WC Fransisca Kaufmann, NP   6 Units at 11/14/13 [phone]  . insulin detemir (LEVEMIR) injection 45 Units  45 Units Subcutaneous QHS Caprice Kluver, MD   45 Units at 11/13/13 2126  . magnesium hydroxide (MILK OF MAGNESIA) suspension 30 mL  30 mL Oral Daily PRN Shuvon Rankin, NP      . metFORMIN (GLUCOPHAGE) tablet 1,000 mg  1,000 mg Oral BID WC Shuvon Rankin, NP   1,000 mg at 11/14/13 0805  . prazosin (MINIPRESS) capsule 5 mg  5 mg Oral QHS Callista Hoh      . traZODone (DESYREL) tablet 50 mg  50 mg Oral QHS PRN Deeksha Cotrell        Lab Results:  Results for orders placed during the hospital encounter of 11/09/13 (from the past 48 hour(s))  GLUCOSE, CAPILLARY     Status: Abnormal   Collection Time    11/12/13 11:50 AM      Result Value Ref Range   Glucose-Capillary 315 (*) 70 - 99 mg/dL  GLUCOSE, CAPILLARY     Status: Abnormal   Collection Time    11/12/13  4:55 PM      Result Value Ref Range   Glucose-Capillary 242 (*) 70 - 99 mg/dL  GLUCOSE, CAPILLARY     Status: Abnormal   Collection Time    11/12/13  8:59 PM      Result Value Ref Range   Glucose-Capillary 250 (*) 70 - 99 mg/dL  GLUCOSE, CAPILLARY     Status: Abnormal   Collection Time    11/13/13  6:18 AM      Result Value Ref Range   Glucose-Capillary 233 (*) 70 - 99 mg/dL  GLUCOSE, CAPILLARY     Status: Abnormal   Collection Time    11/13/13 11:48 AM      Result Value Ref Range   Glucose-Capillary 237 (*) 70 - 99 mg/dL  GLUCOSE, CAPILLARY     Status: Abnormal   Collection Time    11/13/13  4:56 PM      Result Value Ref Range   Glucose-Capillary 275 (*) 70 - 99 mg/dL   Comment 1 Notify RN     Comment 2 Documented in  Chart    GLUCOSE, CAPILLARY     Status: Abnormal   Collection Time    11/13/13  9:01 PM      Result Value Ref Range   Glucose-Capillary 171 (*) 70 - 99 mg/dL  GLUCOSE, CAPILLARY     Status: Abnormal   Collection Time    11/14/13  5:51 AM      Result Value Ref Range  Glucose-Capillary 251 (*) 70 - 99 mg/dL   Comment 1 Notify RN      Physical Findings: AIMS: Facial and Oral Movements Muscles of Facial Expression: None, normal Lips and Perioral Area: None, normal Jaw: None, normal Tongue: None, normal,Extremity Movements Upper (arms, wrists, hands, fingers): None, normal Lower (legs, knees, ankles, toes): None, normal, Trunk Movements Neck, shoulders, hips: None, normal, Overall Severity Severity of abnormal movements (highest score from questions above): None, normal Incapacitation due to abnormal movements: None, normal Patient's awareness of abnormal movements (rate only patient's report): No Awareness, Dental Status Current problems with teeth and/or dentures?: No Does patient usually wear dentures?: No  CIWA:  CIWA-Ar Total: 6 COWS:  COWS Total Score: 7  Treatment Plan Summary: Daily contact with patient to assess and evaluate symptoms and progress in treatment Medication management  Plan: 1. Continue crisis management and stabilization.  2. Medication management:  -Continue Buspar 10 mg TID to decrease anxiety. -Increase Prozac to 40 mg daily for depression. - Continue Haldol 5 mg BID for psychosis. -Continue Cogentin 0.5 mg BID for EPS prevention -Initiate Prazosin 5mg  po qhs for insomnia/nightmares -Decrease Trazodone to 50 mg  qhs as needed for sleep.  3. Encouraged patient to attend groups and participate in group counseling sessions and activities.  4. Discharge plan in progress.  5. Continue current treatment plan.  6. Address health issues: Vitals reviewed and stable. Continue Moderate Novolog SSI, Metformin 1,000 mg BID, Levemir 40 mg hs, and Novolog 6 units  meal coverage for management of Type 2 Diabetes. CBG values have been elevated in the 200's.   Medical Decision Making Problem Points:  Established problem, improving (1), Review of last therapy session (1) and Review of psycho-social stressors (1) Data Points:  Review or order clinical lab tests (1) Review of medication regiment & side effects (2) Review of new medications or change in dosage (2)  I certify that inpatient services furnished can reasonably be expected to improve the patient's condition.   Thedore MinsMojeed Aadvika Konen MD  11/14/2013, 10:43 AM

## 2013-11-15 LAB — GLUCOSE, CAPILLARY
GLUCOSE-CAPILLARY: 226 mg/dL — AB (ref 70–99)
GLUCOSE-CAPILLARY: 130 mg/dL — AB (ref 70–99)
GLUCOSE-CAPILLARY: 198 mg/dL — AB (ref 70–99)
Glucose-Capillary: 306 mg/dL — ABNORMAL HIGH (ref 70–99)

## 2013-11-15 NOTE — Progress Notes (Signed)
Patient ID: Joseph LefortReginald Wildeman, male   DOB: 07/04/1961, 53 y.o.   MRN: [id number]  [facility]  11/15/2013 1:11 PM Joseph LefortReginald Beasley  MRN:  [id number] Subjective:  Patient states "I still have some trouble getting to sleep. I worry about everything. The voices still tell me to hurt myself and that I'm worthless. I'm very depressed. I hope that I will be able to stay on my medication after discharge."  Objective:  Patient is seen and chart is reviewed. He is endorsing ongoing depression characterized by feeling hopeless, worthless, having difficulty sleeping, anxious, fatigue and lack of motivation. He rates his depression at ten and endorses passive SI with no plan. He reports hearing voices telling him to hurt himself and saying that he is worthless. He continues to expresses suicidal thoughts but has no specific plan and able to contract for safety. He is compliant with his medications and denies any adverse effects. Despite patient endorsing severe symptoms his affect today seems brighter and he is actively attending groups. [facility] is interested in [facility] to [facility].   Diagnosis:   DSM5: Total Time spent with patient: 20 minutes  AXIS I: Schizoaffective disorder, depressive type  Cannabis abuse AXIS II: Deferred  AXIS III:  Past Medical History   Diagnosis  Date   .  Diabetes mellitus    .  Hypertension    .  Diabetes mellitus  03/11/2012   .  Hepatitis  06/30/2013     Type C    AXIS IV: housing problems, problems related to legal system/crime and problems with access to health care services  AXIS V: 41-50 moderate symptoms  ADL's:  Intact  Sleep: Fair-6.75 hours documented last night  Appetite:  Good  Suicidal Ideation:  Passive SI with no plan Homicidal Ideation:  Denies AEB (as evidenced by):  Psychiatric Specialty Exam: Physical Exam  Review of Systems  Constitutional: Negative.   HENT: Negative.   Eyes: Negative.    Respiratory: Negative.   Cardiovascular: Negative.   Gastrointestinal: Negative.   Genitourinary: Negative.   Musculoskeletal: Negative.   Skin: Negative.   Neurological: Negative.   Endo/Heme/Allergies: Negative.   Psychiatric/Behavioral: Positive for depression, suicidal ideas, hallucinations and substance abuse. Negative for memory loss. The patient is nervous/anxious and has insomnia.     Blood pressure 118/81, pulse 101, temperature 98.2 F (36.8 C), temperature source Oral, resp. rate 20, height 5\' 9"  (1.753 m), weight 108.863 kg (240 lb).Body mass index is 35.43 kg/(m^2).  General Appearance: Casual  Eye Contact::  Fair  Speech:  Clear and Coherent  Volume:  Normal  Mood:  Depressed  Affect:  Blunt  Thought Process:  Circumstantial  Orientation:  Full (Time, Place, and Person)  Thought Content:  Delusions and Hallucinations: Auditory  Suicidal Thoughts:  Yes.  without intent/plan  Homicidal Thoughts:  No  Memory:  Immediate;   Fair Recent;   Fair Remote;   Fair  Judgement:  Poor  Insight:  Lacking  Psychomotor Activity:  Decreased  Concentration:  Fair  Recall:  [employer]Fair  Fund of Knowledge:Fair  Language: Good  Akathisia:  No  Handed:  Right  AIMS (if indicated):     Assets:  Communication Skills Desire for Improvement Physical Health Resilience  Sleep:  Number of Hours: 6.75   Musculoskeletal: Strength & Muscle Tone: within normal limits Gait & Station: normal Patient leans: N/A  Current Medications: Current Facility-Administered Medications  Medication Dose Route Frequency Provider Last Rate  Last Dose  . acetaminophen (TYLENOL) tablet 650 mg  650 mg Oral Q6H PRN Shuvon Rankin, NP      . alum & mag hydroxide-simeth (MAALOX/MYLANTA) 200-200-20 MG/5ML suspension 30 mL  30 mL Oral Q4H PRN Shuvon Rankin, NP      . benztropine (COGENTIN) tablet 0.5 mg  0.5 mg Oral BID Fransisca Kaufmann, NP   0.5 mg at 11/15/13 0804  . busPIRone (BUSPAR) tablet 10 mg  10 mg Oral TID  Fransisca Kaufmann, NP   10 mg at 11/15/13 1206  . FLUoxetine (PROZAC) capsule 40 mg  40 mg Oral Daily Maha Fischel   40 mg at 11/15/13 0804  . haloperidol (HALDOL) tablet 5 mg  5 mg Oral BID Fransisca Kaufmann, NP   5 mg at 11/15/13 0805  . insulin aspart (novoLOG) injection 0-15 Units  0-15 Units Subcutaneous TID WC Fransisca Kaufmann, NP   2 Units at 11/15/13 1207  . insulin aspart (novoLOG) injection 6 Units  6 Units Subcutaneous TID WC Fransisca Kaufmann, NP   6 Units at 11/15/13 1206  . insulin detemir (LEVEMIR) injection 45 Units  45 Units Subcutaneous QHS Caprice Kluver, MD   45 Units at 11/14/13 2118  . magnesium hydroxide (MILK OF MAGNESIA) suspension 30 mL  30 mL Oral Daily PRN Shuvon Rankin, NP      . metFORMIN (GLUCOPHAGE) tablet 1,000 mg  1,000 mg Oral BID WC Shuvon Rankin, NP   1,000 mg at 11/15/13 0805  . prazosin (MINIPRESS) capsule 5 mg  5 mg Oral QHS Daryle Amis   5 mg at 11/14/13 2118  . traZODone (DESYREL) tablet 50 mg  50 mg Oral QHS PRN Ukiah Trawick        Lab Results:  Results for orders placed during the hospital encounter of 11/09/13 (from the past 48 hour(s))  GLUCOSE, CAPILLARY     Status: Abnormal   Collection Time    11/13/13  4:56 PM      Result Value Ref Range   Glucose-Capillary 275 (*) 70 - 99 mg/dL   Comment 1 Notify RN     Comment 2 Documented in Chart    GLUCOSE, CAPILLARY     Status: Abnormal   Collection Time    11/13/13  9:01 PM      Result Value Ref Range   Glucose-Capillary 171 (*) 70 - 99 mg/dL  GLUCOSE, CAPILLARY     Status: Abnormal   Collection Time    11/14/13  5:51 AM      Result Value Ref Range   Glucose-Capillary 251 (*) 70 - 99 mg/dL   Comment 1 Notify RN    GLUCOSE, CAPILLARY     Status: Abnormal   Collection Time    11/14/13 12:04 PM      Result Value Ref Range   Glucose-Capillary 177 (*) 70 - 99 mg/dL  GLUCOSE, CAPILLARY     Status: Abnormal   Collection Time    11/14/13  4:57 PM      Result Value Ref Range   Glucose-Capillary 264 (*) 70 -  99 mg/dL  GLUCOSE, CAPILLARY     Status: Abnormal   Collection Time    11/14/13  8:45 PM      Result Value Ref Range   Glucose-Capillary 211 (*) 70 - 99 mg/dL   Comment 1 Notify RN    GLUCOSE, CAPILLARY     Status: Abnormal   Collection Time    11/15/13  6:14 AM  Result Value Ref Range   Glucose-Capillary 226 (*) 70 - 99 mg/dL   Comment 1 Notify RN    GLUCOSE, CAPILLARY     Status: Abnormal   Collection Time    11/15/13 11:58 AM      Result Value Ref Range   Glucose-Capillary 130 (*) 70 - 99 mg/dL   Comment 1 Documented in Chart     Comment 2 Notify RN      Physical Findings: AIMS: Facial and Oral Movements Muscles of Facial Expression: None, normal Lips and Perioral Area: None, normal Jaw: None, normal Tongue: None, normal,Extremity Movements Upper (arms, wrists, hands, fingers): None, normal Lower (legs, knees, ankles, toes): None, normal, Trunk Movements Neck, shoulders, hips: None, normal, Overall Severity Severity of abnormal movements (highest score from questions above): None, normal Incapacitation due to abnormal movements: None, normal Patient's awareness of abnormal movements (rate only patient's report): No Awareness, Dental Status Current problems with teeth and/or dentures?: No Does patient usually wear dentures?: No  CIWA:  CIWA-Ar Total: 6 COWS:  COWS Total Score: 7  Treatment Plan Summary: Daily contact with patient to assess and evaluate symptoms and progress in treatment Medication management  Plan: 1. Continue crisis management and stabilization.  2. Medication management:  -Continue Buspar 10 mg TID to decrease anxiety. -Continue Prozac to 40 mg daily for depression. - Continue Haldol 5 mg BID for psychosis. -Continue Cogentin 0.5 mg BID for EPS prevention -Continue Prazosin 5mg  po qhs for insomnia/nightmares -Continue Trazodone to 50 mg qhs as needed for sleep.  3. Encouraged patient to attend groups and participate in group counseling  sessions and activities.  4. Discharge plan in progress.  5. Continue current treatment plan.  6. Address health issues: Vitals reviewed and stable. Continue Moderate Novolog SSI, Metformin 1,000 mg BID, Levemir 45 mg hs, and Novolog 6 units meal coverage for management of Type 2 Diabetes.   Medical Decision Making Problem Points:  Established problem, improving (1), Review of last therapy session (1) and Review of psycho-social stressors (1) Data Points:  Review or order clinical lab tests (1) Review of medication regiment & side effects (2) Review of new medications or change in dosage (2)  I certify that inpatient services furnished can reasonably be expected to improve the patient's condition.   Fransisca KaufmannLaura Davis NP-C  11/15/2013, 1:11 PM  Patient seen, evaluated and I agree with notes by Nurse Practitioner. Thedore MinsMojeed Tomeeka Plaugher, MD

## 2013-11-15 NOTE — [facility] Group Notes (Signed)
[facility] Group Notes:  (Nursing/MHT/Case Management/Adjunct)  Date:  11/15/2013  Time:  10:12 AM  Type of Therapy:  Psychoeducational Skills  Participation Level:  Minimal  Participation Quality:  Appropriate  Affect:  Appropriate  Cognitive:  Alert and Appropriate  Insight:  Appropriate  Engagement in Group:  Engaged  Modes of Intervention:  Clarification and Problem-solving  Summary of Progress/Problems: Morning Wellness; minimal participation d/t seeing the physician  Helyn NumbersJennifer Hundley Katrisha Segall 11/15/2013, 10:12 AM

## 2013-11-15 NOTE — Progress Notes (Signed)
[facility] Group Notes:  (Nursing/MHT/Case Management/Adjunct)  Date:  11/15/2013  Time:  10:50 PM  Type of Therapy:  Psychoeducational Skills  Participation Level:  None  Participation Quality:  None  Affect:    Cognitive:  Lacking  Insight:  None  Engagement in Group:  None  Modes of Intervention:    Summary of Progress/Problems: Patient did not participate in group. Patient stayed quiet during the entire time.   Jamas LavJohn W Mikaelah Trostle 11/15/2013, 10:50 PM

## 2013-11-15 NOTE — Progress Notes (Signed)
Patient ID: Joseph LefortReginald Vasquez, male   DOB: 10/14/1960, 53 y.o.   MRN: [id number] D: pt. In bed, eyes closed, respirations even. A: Writer observed for s/s of distress. Staff will monitor q815min for safety. R: pt. Is safe on the unit, respirations unlabored, no distress noted.

## 2013-11-15 NOTE — [facility] Counselor (Signed)
[facility] LCSW Group Therapy  11/15/2013 , 12:22 PM   Type of Therapy:  Group Therapy  Participation Level:  Active  Participation Quality:  Attentive  Affect:  Appropriate  Cognitive:  Alert  Insight:  Improving  Engagement in Therapy:  Minimal  Modes of Intervention:  Discussion, Exploration and Socialization  Summary of Progress/Problems: Today's group focused on the term Diagnosis.  Participants were asked to define the term, and then pronounce whether it is a negative, positive or neutral term.  [name] sat quietly throughout group.  He appeared to be engaged, and when asked directly about diagnosis, shared that his is Schizophrenia.  Stated that his prognosis improves "when I don't use drugs and take my medication."  Ida Rogueorth, Kalyn Dimattia B 11/15/2013 , 12:22 PM

## 2013-11-15 NOTE — Progress Notes (Signed)
Patient ID: Joseph LefortReginald Fong, male   DOB: 01/09/1961, 53 y.o.   MRN: [id number]  D: Patient has flat affect on approach today. Still continues to report "10" for depression and hopelessness. Reports passive SI on and off but contracts here in hospital. States he hears voices but they are muffled. Reports poor sleep but previous shift reports he slept well.  A: Staff will monitor on q 15 minute checks, follow treatment plan,  And give med as needed. R: Cooperative on unit.

## 2013-11-15 NOTE — Progress Notes (Signed)
D: pt stated he is having passive si. Pt contracts for safety and stated he will let staff know when he is having active thoughts. Pt stated he is hearing voices and they are telling him to hurt himself. Pt stated he is trying to ignore them. Pt is appropriate on unit. minimal contact. Brief eye contact A: support and encouragement given. scheduled medications given. q 15 min safety checks

## 2013-11-16 LAB — GLUCOSE, CAPILLARY
GLUCOSE-CAPILLARY: 288 mg/dL — AB (ref 70–99)
Glucose-Capillary: 205 mg/dL — ABNORMAL HIGH (ref 70–99)
Glucose-Capillary: 236 mg/dL — ABNORMAL HIGH (ref 70–99)
Glucose-Capillary: 184 mg/dL — ABNORMAL HIGH (ref 70–99)

## 2013-11-16 MED ORDER — INSULIN ASPART 100 UNIT/ML ~~LOC~~ SOLN
8.0000 [IU] | Freq: Three times a day (TID) | SUBCUTANEOUS | Status: DC
  Administered 2013-11-16 – 2013-11-17 (×2): 8 [IU] via SUBCUTANEOUS

## 2013-11-16 NOTE — Progress Notes (Signed)
Patient endorsed a good day. He denied SI/Hi and denied Hallucinations. Mood and affect appropriate. Writer encouraged and supported patient. Q 15 minute check continues as ordered to maintain safety.

## 2013-11-16 NOTE — Progress Notes (Addendum)
Patient ID: Joseph LefortReginald Vasquez, male   DOB: 06/04/1961, 53 y.o.   MRN: [id number]  D: Pt. Denies HI and visual Hallucinations. Patient rates his depression and hopelessness at 10/10 for the day. Patient reports passive SI but contracts for safety. Patient reports that he is hearing voices telling him to kill himself. Patient does not report any pain or discomfort at this time. Patient reports that he slept poor and his energy is low today.  A: Support and encouragement provided to the patient. Scheduled medications given to patient per physician's orders.  R: Patient is receptive and cooperative but minimal. Patient is seen in the milieu and speaking with staff. Q15 minute checks are maintained for safety.

## 2013-11-16 NOTE — Tx Team (Signed)
  Interdisciplinary Treatment Plan Update   Date Reviewed: 11/16/2013 Time Reviewed:  3:24 PM  Progress in Treatment:   Attending groups: Yes Participating in groups: Yes Taking medication as prescribed: Yes  Tolerating medication: Yes Family/Significant other contact made: No, pt refused consent.   Patient understands diagnosis: Yes  Discussing patient identified problems/goals with staff: Yes Medical problems stabilized or resolved: Yes Denies suicidal/homicidal ideation: Yes Patient has not harmed self or others: Yes For review of initial/current patient goals, please see plan of care.  Estimated Length of Stay:  D/C tomorrow  Reasons for Continued Hospitalization: N/A  New Problems/Goals identified:  N/A  Discharge Plan or Barriers:   [name] [place] and stay [facility].  [facility] at 3pm.    Additional Comments:  N/A  Attendees:  Signature: Thedore MinsMojeed Akintayo, MD  08/22/2013 8:10 AM   Signature: Richelle Itood Keelin Neville, LCSW  08/22/2013 8:10 AM   Signature: Fransisca KaufmannLaura Davis, NP  08/22/2013 8:10 AM   Signature: Joslyn Devonaroline Beaudry, RN  08/22/2013 8:10 AM   Signature: Liborio NixonPatrice White, RN  08/22/2013 8:10 AM   Signature:  08/22/2013 8:10 AM   Signature:  08/22/2013 8:10 AM   Signature:    Signature:    Signature:    Signature:    Signature:    Signature:      Simona Huhina Yang MSW Intern  3:24 PM 11/16/2013

## 2013-11-16 NOTE — Progress Notes (Signed)
[facility] Discharge [facility] :  Will you be returning to the same living situation after discharge: No.  [place] plans to [facility].   At discharge, do you have transportation home?:Yes,  [place] [facility].  Ticket will be provided by [facility].   Do you have the ability to pay for your medications:Yes,  MCD/MCR  Release of information consent forms completed and in the chart;  Patient's signature needed at discharge.  Patient to Follow up at: Follow-up Information   [facility]  On 11/21/2013. (Monday at 3pm.  Go see the physican assistant, Ms. Walker, for your hospital follow-up appointment.  Please arrive by 2:45pm and bring your photo ID, medication list, and insurance card.  )    Contact information:   [address]  [place], [place] [address] [phone]      Patient denies SI/HI:   Yes,  Yes    Safety Planning and Suicide Prevention discussed:  Yes,  Yes  Simona Huhina Yang 11/16/2013, 3:28 PM

## 2013-11-16 NOTE — Progress Notes (Signed)
Adult Psychoeducational Group Note  Date:  11/16/2013 Time:  10:59 AM  Group Topic/Focus:  Crisis Planning:   The purpose of this group is to help patients create a crisis plan for use upon discharge or in the future, as needed.  Participation Level:  Active  Participation Quality:  Appropriate  Affect:  Appropriate  Cognitive:  Appropriate  Insight: Appropriate  Engagement in Group:  Engaged  Modes of Intervention:  Discussion  Additional Comments:  Pt attended group this morning. Pt was appropriate and participate in group.

## 2013-11-16 NOTE — Progress Notes (Signed)
Patient ID: [name], male   DOB: 08/03/1960, 53 y.o.   MRN: [id number]  [facility] Progress Note  11/16/2013 10:43 AM [name]  MRN:  [id number] Subjective:  Patient states "My mind is clearing up. I feel better now that I now where I am going. I am feeling less depressed. I am not hearing any voices today."  Objective:  Patient is seen and chart is reviewed. The patient reports decreasing symptoms of depression and psychosis. [name] feels that his current medication regimen has started working for him. He is now feeling more hopeful about his future since finding out discharge is scheduled to [facility] tomorrow. Patient remains complaint with medications and denies adverse effects. [name] is attending groups and interacting well with peers. The patient makes his needs known to staff.   Diagnosis:   DSM5: Total Time spent with patient: 15 minutes  AXIS I: Schizoaffective disorder, depressive type  Cannabis abuse AXIS II: Deferred  AXIS III:  Past Medical History   Diagnosis  Date   .  Diabetes mellitus    .  Hypertension    .  Diabetes mellitus  03/11/2012   .  Hepatitis  06/30/2013     Type C    AXIS IV: housing problems, problems related to legal system/crime and problems with access to health care services  AXIS V: 41-50 moderate symptoms  ADL's:  Intact  Sleep: Good  Appetite:  Good  Suicidal Ideation:  Denies  Homicidal Ideation:  Denies AEB (as evidenced by):  Psychiatric Specialty Exam: Physical Exam  Review of Systems  Constitutional: Negative.   HENT: Negative.   Eyes: Negative.   Respiratory: Negative.   Cardiovascular: Negative.   Gastrointestinal: Negative.   Genitourinary: Negative.   Musculoskeletal: Negative.   Skin: Negative.   Neurological: Negative.   Endo/Heme/Allergies: Negative.   Psychiatric/Behavioral: Positive for depression and substance abuse. Negative for suicidal ideas, hallucinations and memory loss. The patient  is nervous/anxious. The patient does not have insomnia.     Blood pressure 126/88, pulse 95, temperature 98.1 F (36.7 C), temperature source Oral, resp. rate 18, height 5\' 9"  (1.753 m), weight 108.863 kg (240 lb).Body mass index is 35.43 kg/(m^2).  General Appearance: Casual  Eye Contact::  Fair  Speech:  Clear and Coherent  Volume:  Normal  Mood:  Anxious  Affect:  Full Range  Thought Process:  Goal Directed and Intact  Orientation:  Full (Time, Place, and Person)  Thought Content:  WDL  Suicidal Thoughts:  No  Homicidal Thoughts:  No  Memory:  Immediate;   Fair Recent;   Fair Remote;   Fair  Judgement:  Poor  Insight:  Lacking  Psychomotor Activity:  Normal  Concentration:  Fair  Recall:  [employer]  Fund of Knowledge:Fair  Language: Good  Akathisia:  No  Handed:  Right  AIMS (if indicated):     Assets:  Communication Skills Desire for Improvement Physical Health Resilience  Sleep:  Number of Hours: 6.75   Musculoskeletal: Strength & Muscle Tone: within normal limits Gait & Station: normal Patient leans: N/A  Current Medications: Current Facility-Administered Medications  Medication Dose Route Frequency Provider Last Rate Last Dose  . acetaminophen (TYLENOL) tablet 650 mg  650 mg Oral Q6H PRN Shuvon Rankin, NP      . alum & mag hydroxide-simeth (MAALOX/MYLANTA) 200-200-20 MG/5ML suspension 30 mL  30 mL Oral Q4H PRN Shuvon Rankin, NP      . benztropine (COGENTIN) tablet 0.5 mg  0.5  mg Oral BID Fransisca KaufmannLaura Davis, NP   0.5 mg at 11/16/13 0813  . busPIRone (BUSPAR) tablet 10 mg  10 mg Oral TID Fransisca KaufmannLaura Davis, NP   10 mg at 11/16/13 0813  . FLUoxetine (PROZAC) capsule 40 mg  40 mg Oral Daily Doretha Goding   40 mg at 11/16/13 0813  . haloperidol (HALDOL) tablet 5 mg  5 mg Oral BID Fransisca KaufmannLaura Davis, NP   5 mg at 11/16/13 0813  . insulin aspart (novoLOG) injection 0-15 Units  0-15 Units Subcutaneous TID WC Fransisca KaufmannLaura Davis, NP   8 Units at 11/16/13 [phone]  . insulin aspart (novoLOG) injection 6  Units  6 Units Subcutaneous TID WC Fransisca KaufmannLaura Davis, NP   6 Units at 11/16/13 [phone]  . insulin detemir (LEVEMIR) injection 45 Units  45 Units Subcutaneous QHS Caprice KluverVinay P Saranga, MD   45 Units at 11/15/13 2120  . magnesium hydroxide (MILK OF MAGNESIA) suspension 30 mL  30 mL Oral Daily PRN Shuvon Rankin, NP      . metFORMIN (GLUCOPHAGE) tablet 1,000 mg  1,000 mg Oral BID WC Shuvon Rankin, NP   1,000 mg at 11/16/13 0813  . prazosin (MINIPRESS) capsule 5 mg  5 mg Oral QHS Cire Clute   5 mg at 11/15/13 2119  . traZODone (DESYREL) tablet 50 mg  50 mg Oral QHS PRN Freman Lapage   50 mg at 11/15/13 2119    Lab Results:  Results for orders placed during the hospital encounter of 11/09/13 (from the past 48 hour(s))  GLUCOSE, CAPILLARY     Status: Abnormal   Collection Time    11/14/13 12:04 PM      Result Value Ref Range   Glucose-Capillary 177 (*) 70 - 99 mg/dL  GLUCOSE, CAPILLARY     Status: Abnormal   Collection Time    11/14/13  4:57 PM      Result Value Ref Range   Glucose-Capillary 264 (*) 70 - 99 mg/dL  GLUCOSE, CAPILLARY     Status: Abnormal   Collection Time    11/14/13  8:45 PM      Result Value Ref Range   Glucose-Capillary 211 (*) 70 - 99 mg/dL   Comment 1 Notify RN    GLUCOSE, CAPILLARY     Status: Abnormal   Collection Time    11/15/13  6:14 AM      Result Value Ref Range   Glucose-Capillary 226 (*) 70 - 99 mg/dL   Comment 1 Notify RN    GLUCOSE, CAPILLARY     Status: Abnormal   Collection Time    11/15/13 11:58 AM      Result Value Ref Range   Glucose-Capillary 130 (*) 70 - 99 mg/dL   Comment 1 Documented in Chart     Comment 2 Notify RN    GLUCOSE, CAPILLARY     Status: Abnormal   Collection Time    11/15/13  4:49 PM      Result Value Ref Range   Glucose-Capillary 306 (*) 70 - 99 mg/dL  GLUCOSE, CAPILLARY     Status: Abnormal   Collection Time    11/15/13  9:06 PM      Result Value Ref Range   Glucose-Capillary 198 (*) 70 - 99 mg/dL   Comment 1 Notify RN     GLUCOSE, CAPILLARY     Status: Abnormal   Collection Time    11/16/13  6:06 AM      Result Value Ref Range   Glucose-Capillary 288 (*)  70 - 99 mg/dL    Physical Findings: AIMS: Facial and Oral Movements Muscles of Facial Expression: None, normal Lips and Perioral Area: None, normal Jaw: None, normal Tongue: None, normal,Extremity Movements Upper (arms, wrists, hands, fingers): None, normal Lower (legs, knees, ankles, toes): None, normal, Trunk Movements Neck, shoulders, hips: None, normal, Overall Severity Severity of abnormal movements (highest score from questions above): None, normal Incapacitation due to abnormal movements: None, normal Patient's awareness of abnormal movements (rate only patient's report): No Awareness, Dental Status Current problems with teeth and/or dentures?: No Does patient usually wear dentures?: No  CIWA:  CIWA-Ar Total: 6 COWS:  COWS Total Score: 7  Treatment Plan Summary: Daily contact with patient to assess and evaluate symptoms and progress in treatment Medication management  Plan: 1. Continue crisis management and stabilization.  2. Medication management:  -Continue Buspar 10 mg TID to decrease anxiety. -Continue Prozac to 40 mg daily for depression. - Continue Haldol 5 mg BID for psychosis. -Continue Cogentin 0.5 mg BID for EPS prevention -Continue Prazosin 5mg  po qhs for insomnia/nightmares -Continue Trazodone to 50 mg qhs as needed for sleep.  3. Encouraged patient to attend groups and participate in group counseling sessions and activities.  4. Discharge plan in progress. Anticipate d/c tomorrow.  5. Continue current treatment plan.  6. Address health issues: Vitals reviewed and stable. Continue Moderate Novolog SSI, Metformin 1,000 mg BID, Levemir 45 mg hs, and Novolog 6 units meal coverage for management of Type 2 Diabetes.   Medical Decision Making Problem Points:  Established problem, improving (1), Review of last therapy session  (1) and Review of psycho-social stressors (1) Data Points:  Review or order clinical lab tests (1) Review of medication regiment & side effects (2)  I certify that inpatient services furnished can reasonably be expected to improve the patient's condition.   Fransisca Kaufmann NP-C  11/16/2013, 10:43 AM  Patient seen, evaluated and I agree with notes by Nurse Practitioner. Thedore Mins, MD

## 2013-11-16 NOTE — [facility] Group Notes (Signed)
[facility]   11/16/2013 11:02 AM  Participation Quality:  Engaged  Mood/Affect:  Flat  Depression Rating:  6  Anxiety Rating:  "manageable"  Thoughts of Suicide:  No Will you contract for safety?   NA  Current AVH:  No  Plan for Discharge/Comments:  As anticipated, [name] is much better today after hearing that we could get [place] transportation [place] [facility].  He denies SI, HI, AVH and states any other symptoms are manageable.  He will leave on bus tomorrow at 11:00 to [place] [facility].  We will make a follow up appointment for [facility] [place] [facility] [place] [facility].  Transportation Means: see above  Supports: family [limited]  Ida Rogueodney B Tito Ausmus

## 2013-11-16 NOTE — [facility] Group Notes (Signed)
[facility]  11/16/2013  1:08 PM  Type of Therapy:  [employer] Presentation   Participation Level:  Did not attend.    Modes of Intervention:  Discussion, Education and Socialization   Summary of Progress/Problems:  Onalee HuaDavid from [employer] came to present his recovery story and play the guitar.  Simona Huhina Yang   11/16/2013  1:08 PM

## 2013-11-17 LAB — GLUCOSE, CAPILLARY: GLUCOSE-CAPILLARY: 245 mg/dL — AB (ref 70–99)

## 2013-11-17 MED ORDER — PRAZOSIN HCL 5 MG PO CAPS: 5.0000 mg | ORAL_CAPSULE | Freq: Every day | ORAL | Status: DC

## 2013-11-17 MED ORDER — INSULIN ASPART 100 UNIT/ML FLEXPEN: 3.0000 [IU] | PEN_INJECTOR | Freq: Three times a day (TID) | SUBCUTANEOUS | Status: DC

## 2013-11-17 MED ORDER — FLUOXETINE HCL 40 MG PO CAPS: 40.0000 mg | ORAL_CAPSULE | Freq: Every day | ORAL | Status: DC

## 2013-11-17 MED ORDER — HALOPERIDOL 5 MG PO TABS: 5.0000 mg | ORAL_TABLET | Freq: Two times a day (BID) | ORAL | Status: DC

## 2013-11-17 MED ORDER — BENZTROPINE MESYLATE 0.5 MG PO TABS: 0.5000 mg | ORAL_TABLET | Freq: Two times a day (BID) | ORAL | Status: DC

## 2013-11-17 MED ORDER — BUSPIRONE HCL 10 MG PO TABS: 10.0000 mg | ORAL_TABLET | Freq: Three times a day (TID) | ORAL | Status: DC

## 2013-11-17 MED ORDER — TRAZODONE HCL 50 MG PO TABS: 50.0000 mg | ORAL_TABLET | Freq: Every evening | ORAL | Status: DC | PRN

## 2013-11-17 NOTE — B[facility] Group Notes (Signed)
[facility] Group Notes:  (Nursing/MHT/Case Management/Adjunct)  Date:  11/17/2013  Time:  10:57 AM  Type of Therapy:  Nurse Education  Participation Level:  None  Participation Quality:  Appropriate  Affect:  Flat  Cognitive:  Appropriate  Insight:  None  Engagement in Group:  None  Modes of Intervention:  Discussion  Summary of Progress/Problems:  Jilian West E Ortha Metts 11/17/2013, 10:57 AM

## 2013-11-17 NOTE — Progress Notes (Signed)
Patient ID: Joseph LefortReginald Vasquez, male   DOB: 10/21/1960, 53 y.o.   MRN: [id number]  Pt. Denies SI/HI and A/V hallucinations. Patient rated his depression and hopelessness at 5/10 for the day. Belongings returned to patient at time of discharge. Patient denies any pain or discomfort. Discharge instructions and medications were reviewed with patient. Patient verbalized understanding of both medications and discharge instructions. Q15 minute safety checks maintained until discharge. No distress noted upon discharge.

## 2013-11-17 NOTE — Discharge Summary (Signed)
Physician Discharge Summary Note  Patient:  Joseph LefortReginald Vasquez is an 53 y.o., male MRN:  [id number] DOB:  06/23/1961 Patient phone:  [phone] (home)  Patient address:   [address]  [place] [id number],  Total Time spent with patient: 20 minutes  Date of Admission:  11/09/2013 Date of Discharge: 11/17/13  Reason for Admission:    Discharge Diagnoses: Principal Problem:   Schizoaffective disorder, depressive type Active Problems:   Cannabis abuse   MDD (major depressive disorder), recurrent, severe, with psychosis   Psychiatric Specialty Exam: Physical Exam  Psychiatric: He has a normal mood and affect. His speech is normal and behavior is normal. Judgment and thought content normal. Cognition and memory are normal.    Review of Systems  Constitutional: Negative.   HENT: Negative.   Eyes: Negative.   Respiratory: Negative.   Cardiovascular: Negative.   Gastrointestinal: Negative.   Genitourinary: Negative.   Musculoskeletal: Negative.   Skin: Negative.   Neurological: Negative.   Endo/Heme/Allergies: Negative.   Psychiatric/Behavioral: Negative.     Blood pressure 128/88, pulse 90, temperature 98 F (36.7 C), temperature source Oral, resp. rate 18, height 5\' 9"  (1.753 m), weight 108.863 kg (240 lb).Body mass index is 35.43 kg/(m^2).  General Appearance: Fairly Groomed  Patent attorneyye Contact::  Good  Speech:  Clear and Coherent and Normal Rate  Volume:  Normal  Mood:  Euthymic  Affect:  Appropriate  Thought Process:  Goal Directed  Orientation:  Full (Time, Place, and Person)  Thought Content:  Negative  Suicidal Thoughts:  No  Homicidal Thoughts:  No  Memory:  Immediate;   Good Recent;   Fair Remote;   Fair  Judgement:  Fair  Insight:  Fair  Psychomotor Activity:  Normal  Concentration:  Fair  Recall:  [employer]Fair  Fund of Knowledge:Fair  Language: Fair  Akathisia:  No  Handed:  Right  AIMS (if indicated):     Assets:  Communication Skills Desire for  Improvement Physical Health  Sleep:  Number of Hours: 6.75    Past Psychiatric History: See H&P Diagnosis:  Hospitalizations:  Outpatient Care:  Substance Abuse Care:  Self-Mutilation:  Suicidal Attempts:  Violent Behaviors:   Musculoskeletal: Strength & Muscle Tone: within normal limits Gait & Station: normal Patient leans: N/A  DSM5:  AXIS I: Schizoaffective disorder, depressive type  Cannabis use disorder  AXIS II: Deferred  AXIS III:  Past Medical History   Diagnosis  Date   .  Diabetes mellitus    .  Hypertension    .  Diabetes mellitus  03/11/2012   .  Hepatitis  06/30/2013     Type C   AXIS IV: other psychosocial or environmental problems and problems related to social environment  AXIS V: 61-70 mild symptoms   Level of Care:  OP  Hospital Course: Joseph LefortReginald Vasquez is a 53 year old male who [facility] with thoughts of suicide over the last few days with plan to shoot self or walk into traffic. Patient reported that his stepmother passed away recently, which prompted an increase in his depression. Patient has [facility] for [facility]. [name] states today during his psychiatric assessment "I am here for suicidal thoughts. My best friend died two weeks ago and my stepmother four weeks ago. I still want to kill myself. I[place]. I hear voices to hurt myself and others. I feel paranoid about everything." [name] was cooperative with his  admission assessment. The patient reported in the ED to the ER PA that he had actually tried to shoot himself prior to admission. Patient does have a history of overdosing on medication and trying to shoot himself. [name] appears depressed and withdrawn today. Rates his depression at ten.    Patient was admitted to the 400 hall for further assessment and medication management. He reported having taken Cymbalta and Seroquel prior to admission. However, the patient  did not feel the combination was helping him. After his psychiatric admission assessment the patient was started on Buspar 10 mg TID for anxiety, Prozac 40 mg daily for depression, Haldol 5 mg BID for psychosis, and Minipress 5 mg hs for PTSD symptoms. His medications for Type 2 Diabetes were continued including Novolog SSI, meal coverage insulin, and Levemir at bedtime. For several days the patient rated symptoms of depression, psychosis and anxiety very high. The patient was compliant with medications and attended the scheduled groups. Patient showed insight during group when he stated that his prognosis gets better when "I don't use drugs and take my medications." There were no episodes of seclusion or restraint. [name] gradually reported a reduction of his psychiatric symptoms. He became more hopeful about his future after finding out [facility]. Patient [place] [facility]. Patient was found stable to discharge by the treatment team. He was provided with prescriptions and a seven day supply of psychotropic medications. The patient adamantly denied thoughts of suicide or homicide prior to his discharge.   Consults:  psychiatry  Significant Diagnostic Studies:  Admission labs reviewed, CBG monitoring   Discharge Vitals:   Blood pressure 128/88, pulse 90, temperature 98 F (36.7 C), temperature source Oral, resp. rate 18, height 5\' 9"  (1.753 m), weight 108.863 kg (240 lb). Body mass index is 35.43 kg/(m^2). Lab Results:   Results for orders placed during the hospital encounter of 11/09/13 (from the past 72 hour(s))  GLUCOSE, CAPILLARY     Status: Abnormal   Collection Time    11/14/13 12:04 PM      Result Value Ref Range   Glucose-Capillary 177 (*) 70 - 99 mg/dL  GLUCOSE, CAPILLARY     Status: Abnormal   Collection Time    11/14/13  4:57 PM      Result Value Ref Range   Glucose-Capillary 264 (*) 70 - 99  mg/dL  GLUCOSE, CAPILLARY     Status: Abnormal   Collection Time    11/14/13  8:45 PM      Result Value Ref Range   Glucose-Capillary 211 (*) 70 - 99 mg/dL   Comment 1 Notify RN    GLUCOSE, CAPILLARY     Status: Abnormal   Collection Time    11/15/13  6:14 AM      Result Value Ref Range   Glucose-Capillary 226 (*) 70 - 99 mg/dL   Comment 1 Notify RN    GLUCOSE, CAPILLARY     Status: Abnormal   Collection Time    11/15/13 11:58 AM      Result Value Ref Range   Glucose-Capillary 130 (*) 70 - 99 mg/dL   Comment 1 Documented in Chart     Comment 2 Notify RN    GLUCOSE, CAPILLARY     Status: Abnormal   Collection Time    11/15/13  4:49 PM      Result Value Ref Range   Glucose-Capillary 306 (*)  70 - 99 mg/dL  GLUCOSE, CAPILLARY     Status: Abnormal   Collection Time    11/15/13  9:06 PM      Result Value Ref Range   Glucose-Capillary 198 (*) 70 - 99 mg/dL   Comment 1 Notify RN    GLUCOSE, CAPILLARY     Status: Abnormal   Collection Time    11/16/13  6:06 AM      Result Value Ref Range   Glucose-Capillary 288 (*) 70 - 99 mg/dL  GLUCOSE, CAPILLARY     Status: Abnormal   Collection Time    11/16/13 12:00 PM      Result Value Ref Range   Glucose-Capillary 205 (*) 70 - 99 mg/dL   Comment 1 Notify RN    GLUCOSE, CAPILLARY     Status: Abnormal   Collection Time    11/16/13  5:00 PM      Result Value Ref Range   Glucose-Capillary 236 (*) 70 - 99 mg/dL  GLUCOSE, CAPILLARY     Status: Abnormal   Collection Time    11/16/13  9:01 PM      Result Value Ref Range   Glucose-Capillary 184 (*) 70 - 99 mg/dL   Comment 1 Notify RN    GLUCOSE, CAPILLARY     Status: Abnormal   Collection Time    11/17/13  6:13 AM      Result Value Ref Range   Glucose-Capillary 245 (*) 70 - 99 mg/dL    Physical Findings: AIMS: Facial and Oral Movements Muscles of Facial Expression: None, normal Lips and Perioral Area: None, normal Jaw: None, normal Tongue: None, normal,Extremity Movements Upper  (arms, wrists, hands, fingers): None, normal Lower (legs, knees, ankles, toes): None, normal, Trunk Movements Neck, shoulders, hips: None, normal, Overall Severity Severity of abnormal movements (highest score from questions above): None, normal Incapacitation due to abnormal movements: None, normal Patient's awareness of abnormal movements (rate only patient's report): No Awareness, Dental Status Current problems with teeth and/or dentures?: No Does patient usually wear dentures?: No  CIWA:  CIWA-Ar Total: 6 COWS:  COWS Total Score: 7  Psychiatric Specialty Exam: See Psychiatric Specialty Exam and Suicide Risk Assessment completed by Attending Physician prior to discharge.  Discharge destination:  Home  Is patient on multiple antipsychotic therapies at discharge:  No   Has Patient had three or more failed trials of antipsychotic monotherapy by history:  No  Recommended Plan for Multiple Antipsychotic Therapies: NA  Discharge Orders   Future Orders Complete By Expires   Discharge instructions  As directed        Medication List    STOP taking these medications       DULoxetine 20 MG capsule  Commonly known as:  CYMBALTA     QUEtiapine 300 MG tablet  Commonly known as:  SEROQUEL      TAKE these medications     Indication   benztropine 0.5 MG tablet  Commonly known as:  COGENTIN  Take 1 tablet (0.5 mg total) by mouth 2 (two) times daily.   Indication:  Extrapyramidal Reaction caused by Medications     busPIRone 10 MG tablet  Commonly known as:  BUSPAR  Take 1 tablet (10 mg total) by mouth 3 (three) times daily.   Indication:  Symptoms of Feeling Anxious     FLUoxetine 40 MG capsule  Commonly known as:  PROZAC  Take 1 capsule (40 mg total) by mouth daily.   Indication:  Depression  haloperidol 5 MG tablet  Commonly known as:  HALDOL  Take 1 tablet (5 mg total) by mouth 2 (two) times daily.   Indication:  Psychosis     insulin aspart 100 UNIT/ML FlexPen   Commonly known as:  NOVOLOG FLEXPEN  Inject 3-10 Units into the skin 3 (three) times daily with meals. Per sliding scale: For diabetes management   Indication:  Type 2 Diabetes     insulin detemir 100 UNIT/ML injection  Commonly known as:  LEVEMIR  Inject 0.4 mLs (40 Units total) into the skin at bedtime. For diabetes control   Indication:  Type 2 Diabetes     metFORMIN 1000 MG tablet  Commonly known as:  GLUCOPHAGE  Take 1 tablet (1,000 mg total) by mouth 2 (two) times daily with a meal. For diabetes control   Indication:  Type 2 Diabetes     prazosin 5 MG capsule  Commonly known as:  MINIPRESS  Take 1 capsule (5 mg total) by mouth at bedtime.   Indication:  Nightmares/Insomnia     traZODone 50 MG tablet  Commonly known as:  DESYREL  Take 1 tablet (50 mg total) by mouth at bedtime as needed for sleep.   Indication:  Trouble Sleeping           Follow-up Information   Follow up with [facility]  On 11/21/2013. (Monday at 3pm.  Go see the physican assistant, Ms. Walker, for your hospital follow-up appointment.  Please arrive by 2:45pm and bring your photo ID, medication list, and insurance card.  )    Contact information:   [address] [place], [place] [address] [phone]      Follow-up recommendations:   Activity: as tolerated  Diet: health  Tests: routine  Other: patient to keep his after care appointment   Comments:   Take all your medications as prescribed by your mental healthcare provider.  Report any adverse effects and or reactions from your medicines to your outpatient provider promptly.  Patient is instructed and cautioned to not engage in alcohol and or illegal drug use while on prescription medicines.  In the event of worsening symptoms, patient is instructed to call the crisis hotline, 911 and or go to the nearest ED for appropriate evaluation and treatment of symptoms.  Follow-up with your primary care provider for your other medical  issues, concerns and or health care needs.   Total Discharge Time:  Greater than 30 minutes.  Signed: Fransisca Kaufmann NP-C 11/17/2013, 9:18 AM  Patient seen, evaluated and I agree with notes by Nurse Practitioner. Thedore Mins, MD

## 2013-11-17 NOTE — [facility] Suicide Risk Assessment (Signed)
   Demographic Factors:  Male, Low socioeconomic status and Unemployed  Total Time spent with patient: 20 minutes  Psychiatric Specialty Exam: Physical Exam  Psychiatric: He has a normal mood and affect. His speech is normal and behavior is normal. Judgment and thought content normal. Cognition and memory are normal.    Review of Systems  Constitutional: Negative.   HENT: Negative.   Eyes: Negative.   Respiratory: Negative.   Cardiovascular: Negative.   Gastrointestinal: Negative.   Genitourinary: Negative.   Musculoskeletal: Negative.   Skin: Negative.   Neurological: Negative.   Endo/Heme/Allergies: Negative.   Psychiatric/Behavioral: Negative.     Blood pressure 128/88, pulse 90, temperature 98 F (36.7 C), temperature source Oral, resp. rate 18, height 5\' 9"  (1.753 m), weight 108.863 kg (240 lb).Body mass index is 35.43 kg/(m^2).  General Appearance: Fairly Groomed  Patent attorneyye Contact::  Good  Speech:  Clear and Coherent and Normal Rate  Volume:  Normal  Mood:  Euthymic  Affect:  Appropriate  Thought Process:  Goal Directed  Orientation:  Full (Time, Place, and Person)  Thought Content:  Negative  Suicidal Thoughts:  No  Homicidal Thoughts:  No  Memory:  Immediate;   Good Recent;   Fair Remote;   Fair  Judgement:  Fair  Insight:  Fair  Psychomotor Activity:  Normal  Concentration:  Fair  Recall:  [employer]Fair  Fund of Knowledge:Fair  Language: Fair  Akathisia:  No  Handed:  Right  AIMS (if indicated):     Assets:  Communication Skills Desire for Improvement Physical Health  Sleep:  Number of Hours: 6.75    Musculoskeletal: Strength & Muscle Tone: within normal limits Gait & Station: normal Patient leans: N/A   Mental Status Per Nursing Assessment::   On Admission:  Suicidal ideation indicated by patient  Current Mental Status by Physician: patient denies suicide ideation, intent or plan  Loss Factors: Financial problems/change in socioeconomic  status  Historical Factors: Impulsivity  Risk Reduction Factors:   Sense of responsibility to family  Continued Clinical Symptoms:  Alcohol/Substance Abuse/Dependencies  Cognitive Features That Contribute To Risk:  Closed-mindedness    Suicide Risk:  Minimal: No identifiable suicidal ideation.  Patients presenting with no risk factors but with morbid ruminations; may be classified as minimal risk based on the severity of the depressive symptoms  Discharge Diagnoses:   AXIS I:  Schizoaffective disorder, depressive type              Cannabis use disorder AXIS II:  Deferred AXIS III:   Past Medical History  Diagnosis Date  . Diabetes mellitus   . Hypertension   . Diabetes mellitus 03/11/2012  . Hepatitis 06/30/2013    Type C   AXIS IV:  other psychosocial or environmental problems and problems related to social environment AXIS V:  61-70 mild symptoms  Plan Of Care/Follow-up recommendations:  Activity:  as tolerated Diet:  health Tests:  routine Other:  patient to keep his after care appointment  Is patient on multiple antipsychotic therapies at discharge:  No   Has Patient had three or more failed trials of antipsychotic monotherapy by history:  No  Recommended Plan for Multiple Antipsychotic Therapies: NA    Thedore MinsMojeed Olinda Nola, MD 11/17/2013, 9:39 AM

## 2013-11-22 NOTE — Progress Notes (Signed)
Patient Discharge Instructions:  After Visit Summary (AVS):   Faxed to:  11/22/13 Discharge Summary Note:   Faxed to:  11/22/13 Psychiatric Admission Assessment Note:   Faxed to:  11/22/13 Suicide Risk Assessment - Discharge Assessment:   Faxed to:  11/22/13 Faxed/Sent to the Next Level Care provider:  11/22/13 [facility]ax[facility] to [facility] @ [phone]  Jerelene ReddenSheena E [place], 11/22/2013, 3:08 PM

## 2013-12-02 ENCOUNTER — Encounter (HOSPITAL_COMMUNITY): Payer: Self-pay | Admitting: Emergency Medicine

## 2013-12-02 ENCOUNTER — Emergency Department (HOSPITAL_COMMUNITY)
Admission: EM | Admit: 2013-12-02 | Discharge: 2013-12-02 | Disposition: A | Discharge status: Home / Self Care | Payer: Medicare Other | Attending: Emergency Medicine | Admitting: Emergency Medicine

## 2013-12-02 DIAGNOSIS — F319 Bipolar disorder, unspecified: Secondary | ICD-10-CM | POA: Insufficient documentation

## 2013-12-02 DIAGNOSIS — E119 Type 2 diabetes mellitus without complications: Secondary | ICD-10-CM | POA: Insufficient documentation

## 2013-12-02 DIAGNOSIS — F411 Generalized anxiety disorder: Secondary | ICD-10-CM | POA: Insufficient documentation

## 2013-12-02 DIAGNOSIS — Z8619 Personal history of other infectious and parasitic diseases: Secondary | ICD-10-CM | POA: Insufficient documentation

## 2013-12-02 DIAGNOSIS — F259 Schizoaffective disorder, unspecified: Secondary | ICD-10-CM | POA: Insufficient documentation

## 2013-12-02 DIAGNOSIS — Z79899 Other long term (current) drug therapy: Secondary | ICD-10-CM | POA: Insufficient documentation

## 2013-12-02 DIAGNOSIS — R739 Hyperglycemia, unspecified: Secondary | ICD-10-CM

## 2013-12-02 DIAGNOSIS — I10 Essential (primary) hypertension: Secondary | ICD-10-CM | POA: Insufficient documentation

## 2013-12-02 DIAGNOSIS — Z794 Long term (current) use of insulin: Secondary | ICD-10-CM | POA: Insufficient documentation

## 2013-12-02 LAB — I-STAT CHEM 8, ED
BUN: 16 mg/dL (ref 6–23)
CALCIUM ION: 1.22 mmol/L (ref 1.12–1.23)
Chloride: 93 mEq/L — ABNORMAL LOW (ref 96–112)
Creatinine, Ser: 1 mg/dL (ref 0.50–1.35)
GLUCOSE: 634 mg/dL — AB (ref 70–99)
HCT: 44 % (ref 39.0–52.0)
Hemoglobin: 15 g/dL (ref 13.0–17.0)
Potassium: 4.2 mEq/L (ref 3.7–5.3)
Sodium: 133 mEq/L — ABNORMAL LOW (ref 137–147)
TCO2: 25 mmol/L (ref 0–100)

## 2013-12-02 LAB — CBG MONITORING, ED
Glucose-Capillary: 589 mg/dL (ref 70–99)
Glucose-Capillary: 343 mg/dL — ABNORMAL HIGH (ref 70–99)

## 2013-12-02 MED ORDER — INSULIN ASPART 100 UNIT/ML ~~LOC~~ SOLN
10.0000 [IU] | Freq: Once | SUBCUTANEOUS | Status: AC
  Administered 2013-12-02: 10 [IU] via INTRAVENOUS
  Filled 2013-12-02: qty 1

## 2013-12-02 MED ORDER — SODIUM CHLORIDE 0.9 % IV BOLUS (SEPSIS)
1000.0000 mL | Freq: Once | INTRAVENOUS | Status: AC
  Administered 2013-12-02: 1000 mL via INTRAVENOUS

## 2013-12-02 MED ORDER — INSULIN ASPART 100 UNIT/ML ~~LOC~~ SOLN
6.0000 [IU] | Freq: Once | SUBCUTANEOUS | Status: AC
  Administered 2013-12-02: 6 [IU] via INTRAVENOUS
  Filled 2013-12-02: qty 1

## 2013-12-02 MED ORDER — SODIUM CHLORIDE 0.9 % IV SOLN
INTRAVENOUS | Status: DC
  Administered 2013-12-02: 100 mL/h via INTRAVENOUS

## 2013-12-02 NOTE — ED Notes (Signed)
[facility] Per pt no meds for 1 week. sts [facility] checked CBG and was reading high and EMS got 552.

## 2013-12-02 NOTE — ED Notes (Addendum)
PT uses sliding scale lantus in AM and PM daily. PT reports he has not taken his lantus this AM. PT states he has been out of lantus for 5 days b/c his script ran out and he didn't have a refill

## 2013-12-02 NOTE — ED Provider Notes (Signed)
CSN: [id number]     Arrival date & time 12/02/13  1053 History   First MD Initiated Contact with Patient 12/02/13 1115     Chief Complaint  Patient presents with  . Hyperglycemia     (Consider location/radiation/quality/duration/timing/severity/associated sxs/prior Treatment) HPI Patient reports he moved to [facility] about 3 weeks ago. He staying at [facility] because he is homeless. He states he ran out of his insulin and his metformin about a week ago. He states he feels fine. He has mild thirst but denies polyuria. He states he had nocturia x3 last night which is usual. He denies weight loss, dizziness, or lightheadedness. He was seen today at [facility] and his blood sugar was noted to be high. He was sent to the ED for treatment. He states that he has medication he can pickup through the [facility] agency. He has to be there however before 4 PM.  PCP and Mental Health [facility]  Past Medical History  Diagnosis Date  . Diabetes mellitus   . Bipolar affective disorder   . Schizophrenia, schizo-affective   . Hypertension   . Diabetes mellitus 03/11/2012  . Bipolar disorder 03/11/2012  . Anxiety   . Depression   . Hepatitis 06/30/2013    Type C   Past Surgical History  Procedure Laterality Date  . Finger surgery    . Wisdom tooth extraction     History reviewed. No pertinent family history. History  Substance Use Topics  . Smoking status: Never Smoker   . Smokeless tobacco: Never Used  . Alcohol Use: No  homeless Moved to [place] 3 weeks ago from [place], [place] On disability for mental health per patient No family in [place].   Review of Systems  All other systems reviewed and are negative.     Allergies  Wellbutrin  Home Medications   Prior to Admission medications   Medication Sig Start Date End Date Taking? Authorizing Provider  benztropine (COGENTIN) 0.5 MG tablet Take 1 tablet (0.5 mg total) by mouth 2 (two) times daily. 11/17/13  Yes Fransisca Kaufmann, NP  busPIRone (BUSPAR) 10 MG  tablet Take 1 tablet (10 mg total) by mouth 3 (three) times daily. 11/17/13  Yes Fransisca Kaufmann, NP  FLUoxetine (PROZAC) 40 MG capsule Take 1 capsule (40 mg total) by mouth daily. 11/17/13  Yes Fransisca Kaufmann, NP  haloperidol (HALDOL) 5 MG tablet Take 1 tablet (5 mg total) by mouth 2 (two) times daily. 11/17/13  Yes Fransisca Kaufmann, NP  insulin aspart (NOVOLOG FLEXPEN) 100 UNIT/ML FlexPen Inject 3-10 Units into the skin 3 (three) times daily with meals. Per sliding scale: For diabetes management 11/17/13  Yes Fransisca Kaufmann, NP  insulin detemir (LEVEMIR) 100 UNIT/ML injection Inject 0.4 mLs (40 Units total) into the skin at bedtime. For diabetes control 07/07/13  Yes Sanjuana Kava, NP  metFORMIN (GLUCOPHAGE) 1000 MG tablet Take 1 tablet (1,000 mg total) by mouth 2 (two) times daily with a meal. For diabetes control 07/07/13  Yes Sanjuana Kava, NP  prazosin (MINIPRESS) 5 MG capsule Take 1 capsule (5 mg total) by mouth at bedtime. 11/17/13  Yes Fransisca Kaufmann, NP  QUEtiapine (SEROQUEL) 300 MG tablet Take 300 mg by mouth at bedtime.   Yes Historical Provider, MD  traZODone (DESYREL) 50 MG tablet Take 1 tablet (50 mg total) by mouth at bedtime as needed for sleep. 11/17/13  Yes Fransisca Kaufmann, NP   BP 124/76  Pulse 70  Temp(Src) 98.3 F (36.8 C)  Resp 18  SpO2  100%  Vital signs normal   Physical Exam  Nursing note and vitals reviewed. Constitutional: He is oriented to person, place, and time. He appears well-developed and well-nourished.  Non-toxic appearance. He does not appear ill. No distress.  HENT:  Head: Normocephalic and atraumatic.  Right Ear: External ear normal.  Left Ear: External ear normal.  Nose: Nose normal. No mucosal edema or rhinorrhea.  Mouth/Throat: Mucous membranes are normal. No dental abscesses or uvula swelling.  Tongue mildly dry  Eyes: Conjunctivae and EOM are normal. Pupils are equal, round, and reactive to light.  Neck: Normal range of motion and full passive range of motion without  pain. Neck supple.  Cardiovascular: Normal rate, regular rhythm and normal heart sounds.  Exam reveals no gallop and no friction rub.   No murmur heard. Pulmonary/Chest: Effort normal and breath sounds normal. No respiratory distress. He has no wheezes. He has no rhonchi. He has no rales. He exhibits no tenderness and no crepitus.  Abdominal: Soft. Normal appearance and bowel sounds are normal. He exhibits no distension. There is no tenderness. There is no rebound and no guarding.  Musculoskeletal: Normal range of motion. He exhibits no edema and no tenderness.  Moves all extremities well.   Neurological: He is alert and oriented to person, place, and time. He has normal strength. No cranial nerve deficit.  Skin: Skin is warm, dry and intact. No rash noted. No erythema. No pallor.  Psychiatric: He has a normal mood and affect. His speech is normal and behavior is normal. His mood appears not anxious.    ED Course  Procedures (including critical care time)  Medications  0.9 %  sodium chloride infusion (100 mL/hr Intravenous New Bag/Given 12/02/13 1326)  insulin aspart (novoLOG) injection 6 Units (not administered)  sodium chloride 0.9 % bolus 1,000 mL (0 mLs Intravenous Stopped 12/02/13 1247)  insulin aspart (novoLOG) injection 10 Units (10 Units Intravenous Given 12/02/13 1242)  sodium chloride 0.9 % bolus 1,000 mL (0 mLs Intravenous Stopped 12/02/13 1322)      Labs Review Results for orders placed during the hospital encounter of 12/02/13  CBG MONITORING, ED      Result Value Ref Range   Glucose-Capillary 589 (*) 70 - 99 mg/dL   Comment 1 Notify RN    I-STAT CHEM 8, ED      Result Value Ref Range   Sodium 133 (*) 137 - 147 mEq/L   Potassium 4.2  3.7 - 5.3 mEq/L   Chloride 93 (*) 96 - 112 mEq/L   BUN 16  6 - 23 mg/dL   Creatinine, Ser 7.821.00  0.50 - 1.35 mg/dL   Glucose, Bld [id number] (*) 70 - 99 mg/dL   Calcium, Ion 2.131.22  0.861.12 - 1.23 mmol/L   TCO2 25  0 - 100 mmol/L   Hemoglobin 15.0  13.0  - 17.0 g/dL   HCT 57.844.0  46.939.0 - 62.952.0 %   Comment NOTIFIED PHYSICIAN    CBG MONITORING, ED      Result Value Ref Range   Glucose-Capillary 343 (*) 70 - 99 mg/dL   Laboratory interpretation all normal except for being hyperglycemia with treatment .    Imaging Review No results found.   EKG Interpretation None      MDM   patient given IV fluids and insulin for his hyperglycemia without acidosis. Patient has no worrisome precipitating event such as infection. His hyperglycemia is basically from running out of his medications. He however has his prescriptions that  can be picked up this afternoon    Final diagnoses:  Hyperglycemia   Plan discharge  Devoria AlbeIva Dan Scearce, MD, [facility]      Apurva Reily L Sareena Odeh, MD 12/02/13 1352

## 2013-12-02 NOTE — ED Notes (Signed)
PT requested bus pass d/t lack of transportation. Retrieved pass for PT

## 2013-12-02 NOTE — Discharge Instructions (Signed)
Go to pick up your medications this afternoon. [facility].

## 2013-12-02 NOTE — ED Notes (Signed)
PT asked if he was getting more fluid b/c he felt ready to go. I told PT we were running his blood tests and giving him insulin

## 2013-12-02 NOTE — ED Notes (Signed)
Mia, RN notified of abnormal lab test results

## 2015-01-14 ENCOUNTER — Encounter (HOSPITAL_COMMUNITY): Payer: Self-pay | Admitting: Emergency Medicine

## 2015-01-14 ENCOUNTER — Emergency Department (HOSPITAL_COMMUNITY)
Admission: EM | Admit: 2015-01-14 | Discharge: 2015-01-15 | Disposition: A | Discharge status: Home / Self Care | Payer: Medicare Other | Attending: Emergency Medicine | Admitting: Emergency Medicine

## 2015-01-14 DIAGNOSIS — F419 Anxiety disorder, unspecified: Secondary | ICD-10-CM | POA: Insufficient documentation

## 2015-01-14 DIAGNOSIS — E119 Type 2 diabetes mellitus without complications: Secondary | ICD-10-CM | POA: Insufficient documentation

## 2015-01-14 DIAGNOSIS — R45851 Suicidal ideations: Secondary | ICD-10-CM

## 2015-01-14 DIAGNOSIS — Z8619 Personal history of other infectious and parasitic diseases: Secondary | ICD-10-CM | POA: Insufficient documentation

## 2015-01-14 DIAGNOSIS — F329 Major depressive disorder, single episode, unspecified: Secondary | ICD-10-CM | POA: Diagnosis not present

## 2015-01-14 DIAGNOSIS — Z794 Long term (current) use of insulin: Secondary | ICD-10-CM | POA: Insufficient documentation

## 2015-01-14 DIAGNOSIS — F1414 Cocaine abuse with cocaine-induced mood disorder: Secondary | ICD-10-CM

## 2015-01-14 DIAGNOSIS — I10 Essential (primary) hypertension: Secondary | ICD-10-CM | POA: Insufficient documentation

## 2015-01-14 DIAGNOSIS — F259 Schizoaffective disorder, unspecified: Secondary | ICD-10-CM | POA: Insufficient documentation

## 2015-01-14 DIAGNOSIS — F32A Depression, unspecified: Secondary | ICD-10-CM

## 2015-01-14 DIAGNOSIS — F1210 Cannabis abuse, uncomplicated: Secondary | ICD-10-CM

## 2015-01-14 DIAGNOSIS — Z79899 Other long term (current) drug therapy: Secondary | ICD-10-CM | POA: Insufficient documentation

## 2015-01-14 DIAGNOSIS — F1410 Cocaine abuse, uncomplicated: Secondary | ICD-10-CM | POA: Diagnosis not present

## 2015-01-14 LAB — COMPREHENSIVE METABOLIC PANEL
ALT: 19 U/L (ref 17–63)
AST: 23 U/L (ref 15–41)
Albumin: 4.3 g/dL (ref 3.5–5.0)
Alkaline Phosphatase: 70 U/L (ref 38–126)
Anion gap: 14 (ref 5–15)
BILIRUBIN TOTAL: 0.7 mg/dL (ref 0.3–1.2)
BUN: 15 mg/dL (ref 6–20)
CALCIUM: 10 mg/dL (ref 8.9–10.3)
CHLORIDE: 101 mmol/L (ref 101–111)
CO2: 25 mmol/L (ref 22–32)
Creatinine, Ser: 1.02 mg/dL (ref 0.61–1.24)
GFR calc non Af Amer: >60 mL/min (ref >60)
Glucose, Bld: 266 mg/dL — ABNORMAL HIGH (ref 65–99)
POTASSIUM: 4 mmol/L (ref 3.5–5.1)
Sodium: 140 mmol/L (ref 135–145)
Total Protein: 8.5 g/dL — ABNORMAL HIGH (ref 6.5–8.1)

## 2015-01-14 LAB — CBC
HCT: 39.5 % (ref 39.0–52.0)
Hemoglobin: 12.9 g/dL — ABNORMAL LOW (ref 13.0–17.0)
MCH: 28.1 pg (ref 26.0–34.0)
MCHC: 32.7 g/dL (ref 30.0–36.0)
MCV: 86.1 fL (ref 78.0–100.0)
Platelets: 279 10*3/uL (ref 150–400)
RBC: 4.59 MIL/uL (ref 4.22–5.81)
RDW: 13 % (ref 11.5–15.5)
WBC: 9.2 10*3/uL (ref 4.0–10.5)

## 2015-01-14 LAB — CBG MONITORING, ED
GLUCOSE-CAPILLARY: 150 mg/dL — AB (ref 65–99)
Glucose-Capillary: 182 mg/dL — ABNORMAL HIGH (ref 65–99)
Glucose-Capillary: 201 mg/dL — ABNORMAL HIGH (ref 65–99)

## 2015-01-14 LAB — RAPID URINE DRUG SCREEN, HOSP PERFORMED
Amphetamines: NOT DETECTED
BARBITURATES: NOT DETECTED
BENZODIAZEPINES: NOT DETECTED
COCAINE: POSITIVE — AB
OPIATES: NOT DETECTED
TETRAHYDROCANNABINOL: POSITIVE — AB

## 2015-01-14 LAB — ACETAMINOPHEN LEVEL: Acetaminophen (Tylenol), Serum: <10 ug/mL — ABNORMAL LOW (ref 10–30)

## 2015-01-14 LAB — ETHANOL: Alcohol, Ethyl (B): <5 mg/dL (ref <5)

## 2015-01-14 LAB — SALICYLATE LEVEL

## 2015-01-14 MED ORDER — ALUM & MAG HYDROXIDE-SIMETH 200-200-20 MG/5ML PO SUSP: 30.0000 mL | ORAL | Status: DC | PRN

## 2015-01-14 MED ORDER — NICOTINE 21 MG/24HR TD PT24: 21.0000 mg | MEDICATED_PATCH | Freq: Every day | TRANSDERMAL | Status: DC | PRN

## 2015-01-14 MED ORDER — LORAZEPAM 1 MG PO TABS
1.0000 mg | ORAL_TABLET | Freq: Three times a day (TID) | ORAL | Status: DC | PRN
  Administered 2015-01-14: 1 mg via ORAL
  Filled 2015-01-14: qty 1

## 2015-01-14 MED ORDER — INSULIN DETEMIR 100 UNIT/ML ~~LOC~~ SOLN
40.0000 [IU] | Freq: Every day | SUBCUTANEOUS | Status: DC
  Administered 2015-01-14: 40 [IU] via SUBCUTANEOUS
  Filled 2015-01-14 (×3): qty 0.4

## 2015-01-14 MED ORDER — METFORMIN HCL 500 MG PO TABS
1000.0000 mg | ORAL_TABLET | Freq: Two times a day (BID) | ORAL | Status: DC
  Administered 2015-01-14 – 2015-01-15 (×3): 1000 mg via ORAL
  Filled 2015-01-14 (×6): qty 2

## 2015-01-14 MED ORDER — IBUPROFEN 200 MG PO TABS
400.0000 mg | ORAL_TABLET | Freq: Three times a day (TID) | ORAL | Status: DC | PRN
  Administered 2015-01-14 – 2015-01-15 (×2): 400 mg via ORAL
  Filled 2015-01-14 (×3): qty 2

## 2015-01-14 MED ORDER — FLUOXETINE HCL 20 MG PO CAPS
40.0000 mg | ORAL_CAPSULE | Freq: Every day | ORAL | Status: DC
  Administered 2015-01-14 – 2015-01-15 (×2): 40 mg via ORAL
  Filled 2015-01-14 (×2): qty 2

## 2015-01-14 MED ORDER — ASPIRIN-ACETAMINOPHEN-CAFFEINE 250-250-65 MG PO TABS
1.0000 | ORAL_TABLET | Freq: Four times a day (QID) | ORAL | Status: DC | PRN
  Filled 2015-01-14: qty 1

## 2015-01-14 MED ORDER — BUPROPION HCL ER (SR) 150 MG PO TB12
150.0000 mg | ORAL_TABLET | Freq: Every day | ORAL | Status: DC
  Administered 2015-01-14 – 2015-01-15 (×2): 150 mg via ORAL
  Filled 2015-01-14 (×2): qty 1

## 2015-01-14 MED ORDER — INSULIN ASPART 100 UNIT/ML ~~LOC~~ SOLN
3.0000 [IU] | Freq: Three times a day (TID) | SUBCUTANEOUS | Status: DC
  Filled 2015-01-14: qty 0.1

## 2015-01-14 MED ORDER — INSULIN ASPART 100 UNIT/ML ~~LOC~~ SOLN
0.0000 [IU] | Freq: Three times a day (TID) | SUBCUTANEOUS | Status: DC
  Administered 2015-01-14: 3 [IU] via SUBCUTANEOUS
  Administered 2015-01-15: 1 [IU] via SUBCUTANEOUS
  Filled 2015-01-14 (×3): qty 1

## 2015-01-14 MED ORDER — GLIPIZIDE 10 MG PO TABS
10.0000 mg | ORAL_TABLET | Freq: Two times a day (BID) | ORAL | Status: DC
  Administered 2015-01-14 – 2015-01-15 (×3): 10 mg via ORAL
  Filled 2015-01-14 (×5): qty 1

## 2015-01-14 MED ORDER — ACETAMINOPHEN 325 MG PO TABS: 650.0000 mg | ORAL_TABLET | ORAL | Status: DC | PRN

## 2015-01-14 MED ORDER — ONDANSETRON HCL 4 MG PO TABS: 4.0000 mg | ORAL_TABLET | Freq: Three times a day (TID) | ORAL | Status: DC | PRN

## 2015-01-14 NOTE — ED Notes (Signed)
Report received from Janie Rambo RN. Pt. Sleeping, respirations regular and unlabored. Will continue to monitor for safety via security cameras and Q 15 minute checks. 

## 2015-01-14 NOTE — ED Notes (Signed)
Patient stated that he is feeling nauseous and would like something. RN notified

## 2015-01-14 NOTE — BH Assessment (Signed)
BHH Assessment Progress Note      Spoke to [facility] at [facility] to schedule TTS consult.  Pt is in the hallway and will need to be moved to the conference room for privacy.  Amber will notify when pt has been relocated.

## 2015-01-14 NOTE — ED Notes (Signed)
Pt arrived to the ED with a complaint of suicidal thoughts. Pt states he has been on medication but it is not working.  Pt states he is a patient at [facility] in [place].  Pt states it has been a month since he has seen them.  Pt is complaining of a headache that has been present for 22 days.  Pt states he has been having suicidal thoughts for the same period of time.  Pt states he wants to shoot himself with a firearm but does not own a firearm.

## 2015-01-14 NOTE — ED Notes (Signed)
Pt. Noted sleeping in room. No complaints or concerns voiced. No distress or abnormal behavior noted. Will continue to monitor with security cameras. Q 15 minute rounds continue. 

## 2015-01-14 NOTE — ED Provider Notes (Signed)
CSN: [id number]     Arrival date & time 01/14/15  0436 History   First MD Initiated Contact with Patient 01/14/15 [phone]     Chief Complaint  Patient presents with  . Suicidal      HPI  Pt was seen at 0740. Per pt, c/o gradual onset and worsening of persistent depression and SI for the past 1 week. Pt states his plan is to "shoot myself." States he is a pt of [facility] in [place], and his "meds aren't working." Denies SA, no HI, no hallucinations.   Past Medical History  Diagnosis Date  . Diabetes mellitus   . Bipolar affective disorder   . Schizophrenia, schizo-affective   . Hypertension   . Diabetes mellitus 03/11/2012  . Bipolar disorder 03/11/2012  . Anxiety   . Depression   . Hepatitis 06/30/2013    Type C   Past Surgical History  Procedure Laterality Date  . Finger surgery    . Wisdom tooth extraction      History  Substance Use Topics  . Smoking status: Never Smoker   . Smokeless tobacco: Never Used  . Alcohol Use: No    Review of Systems ROS: Statement: All systems negative except as marked or noted in the HPI; Constitutional: Negative for fever and chills. ; ; Eyes: Negative for eye pain, redness and discharge. ; ; ENMT: Negative for ear pain, hoarseness, nasal congestion, sinus pressure and sore throat. ; ; Cardiovascular: Negative for chest pain, palpitations, diaphoresis, dyspnea and peripheral edema. ; ; Respiratory: Negative for cough, wheezing and stridor. ; ; Gastrointestinal: Negative for nausea, vomiting, diarrhea, abdominal pain, blood in stool, hematemesis, jaundice and rectal bleeding. . ; ; Genitourinary: Negative for dysuria, flank pain and hematuria. ; ; Musculoskeletal: Negative for back pain and neck pain. Negative for swelling and trauma.; ; Skin: Negative for pruritus, rash, abrasions, blisters, bruising and skin lesion.; ; Neuro: Negative for headache, lightheadedness and neck stiffness. Negative for weakness, altered level of consciousness , altered  mental status, extremity weakness, paresthesias, involuntary movement, seizure and syncope.; Psych:  +SI. No SA, no HI, no hallucinations.      Allergies  Wellbutrin  Home Medications   Prior to Admission medications   Medication Sig Start Date End Date Taking? Authorizing Provider  aspirin-acetaminophen-caffeine (EXCEDRIN MIGRAINE) [phone] MG per tablet Take 1 tablet by mouth every 6 (six) hours as needed for headache.   Yes Historical Provider, MD  buPROPion (WELLBUTRIN SR) 150 MG 12 hr tablet Take 150 mg by mouth daily.   Yes Historical Provider, MD  FLUoxetine (PROZAC) 40 MG capsule Take 1 capsule (40 mg total) by mouth daily. 11/17/13  Yes Thermon Leyland, NP  glipiZIDE (GLUCOTROL) 10 MG tablet Take 10 mg by mouth 2 (two) times daily before a meal.   Yes Historical Provider, MD  insulin aspart (NOVOLOG FLEXPEN) 100 UNIT/ML FlexPen Inject 3-10 Units into the skin 3 (three) times daily with meals. Per sliding scale: For diabetes management 11/17/13  Yes Thermon Leyland, NP  insulin detemir (LEVEMIR) 100 UNIT/ML injection Inject 0.4 mLs (40 Units total) into the skin at bedtime. For diabetes control Patient taking differently: Inject 42 Units into the skin at bedtime. For diabetes control 07/07/13  Yes Sanjuana Kava, NP  metFORMIN (GLUCOPHAGE) 1000 MG tablet Take 1 tablet (1,000 mg total) by mouth 2 (two) times daily with a meal. For diabetes control 07/07/13  Yes Sanjuana Kava, NP   BP 129/85 mmHg  Pulse 73  Temp(Src) 98.3 F (36.8 C) (Oral)  Resp 19  Ht  (1.753 m)  Wt 225 lb (102.059 kg)  BMI 33.21 kg/m2  SpO2 99% Physical Exam  0745: Physical examination:  Nursing notes reviewed; Vital signs and O2 SAT reviewed;  Constitutional: Well developed, Well nourished, Well hydrated, In no acute distress; Head:  Normocephalic, atraumatic; Eyes: EOMI, PERRL, No scleral icterus; ENMT: Mouth and pharynx normal, Mucous membranes moist; Neck: Supple, Full range of motion, No lymphadenopathy;  Cardiovascular: Regular rate and rhythm, No gallop; Respiratory: Breath sounds clear & equal bilaterally, No wheezes.  Speaking full sentences with ease, Normal respiratory effort/excursion; Chest: Nontender, Movement normal; Abdomen: Soft, Nontender, Nondistended, Normal bowel sounds;; Extremities: Pulses normal, No tenderness, No edema, No calf edema or asymmetry.; Neuro: AA&Ox3, Major CN grossly intact.  Speech clear. No gross focal motor or sensory deficits in extremities.; Skin: Color normal, Warm, Dry.; Psych:  Endorses SI, no psychosis.    ED Course  Procedures     EKG Interpretation None      MDM  MDM Reviewed: previous chart, nursing note and vitals Reviewed previous: labs Interpretation: labs      Results for orders placed or performed during the hospital encounter of 01/14/15  Acetaminophen level  Result Value Ref Range   Acetaminophen (Tylenol), Serum <10 (L) 10 - 30 ug/mL  CBC  Result Value Ref Range   WBC 9.2 4.0 - 10.5 K/uL   RBC 4.59 4.22 - 5.81 MIL/uL   Hemoglobin 12.9 (L) 13.0 - 17.0 g/dL   HCT 16.1 09.6 - 04.5 %   MCV 86.1 78.0 - 100.0 fL   MCH 28.1 26.0 - 34.0 pg   MCHC 32.7 30.0 - 36.0 g/dL   RDW 40.9 81.1 - 91.4 %   Platelets 279 150 - 400 K/uL  Comprehensive metabolic panel  Result Value Ref Range   Sodium 140 135 - 145 mmol/L   Potassium 4.0 3.5 - 5.1 mmol/L   Chloride 101 101 - 111 mmol/L   CO2 25 22 - 32 mmol/L   Glucose, Bld 266 (H) 65 - 99 mg/dL   BUN 15 6 - 20 mg/dL   Creatinine, Ser 7.82 0.61 - 1.24 mg/dL   Calcium 95.6 8.9 - 21.3 mg/dL   Total Protein 8.5 (H) 6.5 - 8.1 g/dL   Albumin 4.3 3.5 - 5.0 g/dL   AST 23 15 - 41 U/L   ALT 19 17 - 63 U/L   Alkaline Phosphatase 70 38 - 126 U/L   Total Bilirubin 0.7 0.3 - 1.2 mg/dL   GFR calc non Af Amer >60 >60 mL/min   GFR calc Af Amer >60 >60 mL/min   Anion gap 14 5 - 15  Ethanol (ETOH)  Result Value Ref Range   Alcohol, Ethyl (B) <5 <5 mg/dL  Salicylate level  Result Value Ref Range    Salicylate Lvl <4.0 2.8 - 30.0 mg/dL  Urine rapid drug screen (hosp performed)not at [facility]  Result Value Ref Range   Opiates NONE DETECTED NONE DETECTED   Cocaine POSITIVE (A) NONE DETECTED   Benzodiazepines NONE DETECTED NONE DETECTED   Amphetamines NONE DETECTED NONE DETECTED   Tetrahydrocannabinol POSITIVE (A) NONE DETECTED   Barbiturates NONE DETECTED NONE DETECTED    0800:  TTS to eval.   1600:  TTS has evaluated pt: will have Psych APP/MD evaluate pt further. Holding orders written.   Samuel Jester, DO 01/14/15 [phone]
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
## 2015-01-14 NOTE — ED Notes (Signed)
Pt ambulatory w/o difficulty from ED to room 34.

## 2015-01-14 NOTE — ED Notes (Signed)
Bed: [facility] Expected date:  Expected time:  Means of arrival:  Comments: Tr6

## 2015-01-14 NOTE — ED Notes (Signed)
Pt. Alert and oriented in no distress denies VH and pain.  Pt. States he has SI w/o plan and HI toward other people.Pt. Instructed to come to me with problems or concerns.Will continue to monitor for safety via security cameras and Q 15 minute checks.

## 2015-01-15 ENCOUNTER — Encounter (HOSPITAL_COMMUNITY): Payer: Self-pay | Admitting: *Deleted

## 2015-01-15 ENCOUNTER — Inpatient Hospital Stay (HOSPITAL_COMMUNITY): Admit: 2015-01-15 | Payer: Self-pay

## 2015-01-15 ENCOUNTER — Inpatient Hospital Stay (HOSPITAL_COMMUNITY)
Admission: AD | Admit: 2015-01-15 | Discharge: 2015-01-23 | DRG: 885 | Disposition: A | Discharge status: Home / Self Care | Payer: Medicare Other | Source: Intra-hospital | Attending: Psychiatry | Admitting: Psychiatry

## 2015-01-15 DIAGNOSIS — R45851 Suicidal ideations: Secondary | ICD-10-CM | POA: Diagnosis present

## 2015-01-15 DIAGNOSIS — G4700 Insomnia, unspecified: Secondary | ICD-10-CM | POA: Diagnosis present

## 2015-01-15 DIAGNOSIS — F1220 Cannabis dependence, uncomplicated: Secondary | ICD-10-CM | POA: Diagnosis present

## 2015-01-15 DIAGNOSIS — R51 Headache: Secondary | ICD-10-CM | POA: Diagnosis present

## 2015-01-15 DIAGNOSIS — R519 Headache, unspecified: Secondary | ICD-10-CM

## 2015-01-15 DIAGNOSIS — Z833 Family history of diabetes mellitus: Secondary | ICD-10-CM | POA: Diagnosis not present

## 2015-01-15 DIAGNOSIS — Z634 Disappearance and death of family member: Secondary | ICD-10-CM

## 2015-01-15 DIAGNOSIS — F329 Major depressive disorder, single episode, unspecified: Secondary | ICD-10-CM | POA: Diagnosis not present

## 2015-01-15 DIAGNOSIS — E119 Type 2 diabetes mellitus without complications: Secondary | ICD-10-CM

## 2015-01-15 DIAGNOSIS — F251 Schizoaffective disorder, depressive type: Secondary | ICD-10-CM | POA: Diagnosis present

## 2015-01-15 DIAGNOSIS — I10 Essential (primary) hypertension: Secondary | ICD-10-CM | POA: Diagnosis present

## 2015-01-15 DIAGNOSIS — E785 Hyperlipidemia, unspecified: Secondary | ICD-10-CM | POA: Clinically undetermined

## 2015-01-15 DIAGNOSIS — F411 Generalized anxiety disorder: Secondary | ICD-10-CM | POA: Diagnosis present

## 2015-01-15 DIAGNOSIS — F1420 Cocaine dependence, uncomplicated: Secondary | ICD-10-CM | POA: Diagnosis present

## 2015-01-15 DIAGNOSIS — E221 Hyperprolactinemia: Secondary | ICD-10-CM | POA: Clinically undetermined

## 2015-01-15 DIAGNOSIS — F1414 Cocaine abuse with cocaine-induced mood disorder: Secondary | ICD-10-CM | POA: Diagnosis not present

## 2015-01-15 LAB — CBG MONITORING, ED
GLUCOSE-CAPILLARY: 87 mg/dL (ref 65–99)
Glucose-Capillary: 136 mg/dL — ABNORMAL HIGH (ref 65–99)

## 2015-01-15 LAB — GLUCOSE, CAPILLARY
GLUCOSE-CAPILLARY: 153 mg/dL — AB (ref 65–99)
Glucose-Capillary: 134 mg/dL — ABNORMAL HIGH (ref 65–99)

## 2015-01-15 MED ORDER — ONDANSETRON HCL 4 MG PO TABS: 4.0000 mg | ORAL_TABLET | Freq: Three times a day (TID) | ORAL | Status: DC | PRN

## 2015-01-15 MED ORDER — TRAZODONE HCL 50 MG PO TABS: 50.0000 mg | ORAL_TABLET | Freq: Every evening | ORAL | Status: DC | PRN

## 2015-01-15 MED ORDER — INSULIN ASPART 100 UNIT/ML ~~LOC~~ SOLN
0.0000 [IU] | Freq: Three times a day (TID) | SUBCUTANEOUS | Status: DC
  Administered 2015-01-15: 2 [IU] via SUBCUTANEOUS
  Administered 2015-01-16 (×2): 1 [IU] via SUBCUTANEOUS
  Administered 2015-01-17 – 2015-01-18 (×2): 2 [IU] via SUBCUTANEOUS
  Administered 2015-01-18 – 2015-01-19 (×3): 3 [IU] via SUBCUTANEOUS
  Administered 2015-01-19: 2 [IU] via SUBCUTANEOUS
  Administered 2015-01-20: 3 [IU] via SUBCUTANEOUS
  Administered 2015-01-20: 7 [IU] via SUBCUTANEOUS
  Administered 2015-01-20: 2 [IU] via SUBCUTANEOUS
  Administered 2015-01-21: 1 [IU] via SUBCUTANEOUS
  Administered 2015-01-21: 2 [IU] via SUBCUTANEOUS
  Administered 2015-01-21: 3 [IU] via SUBCUTANEOUS
  Administered 2015-01-22: 1 [IU] via SUBCUTANEOUS
  Administered 2015-01-22: 2 [IU] via SUBCUTANEOUS

## 2015-01-15 MED ORDER — INSULIN DETEMIR 100 UNIT/ML ~~LOC~~ SOLN
40.0000 [IU] | Freq: Every day | SUBCUTANEOUS | Status: DC
  Administered 2015-01-15 – 2015-01-22 (×8): 40 [IU] via SUBCUTANEOUS

## 2015-01-15 MED ORDER — FLUOXETINE HCL 20 MG PO CAPS
40.0000 mg | ORAL_CAPSULE | Freq: Every day | ORAL | Status: DC
  Administered 2015-01-16: 40 mg via ORAL
  Filled 2015-01-15 (×3): qty 2

## 2015-01-15 MED ORDER — GLIPIZIDE 10 MG PO TABS
10.0000 mg | ORAL_TABLET | Freq: Two times a day (BID) | ORAL | Status: DC
  Administered 2015-01-15 – 2015-01-23 (×16): 10 mg via ORAL
  Filled 2015-01-15 (×4): qty 1
  Filled 2015-01-15: qty 2
  Filled 2015-01-15 (×13): qty 1
  Filled 2015-01-15: qty 2
  Filled 2015-01-15: qty 1

## 2015-01-15 MED ORDER — ACETAMINOPHEN 325 MG PO TABS
650.0000 mg | ORAL_TABLET | ORAL | Status: DC | PRN
  Filled 2015-01-15 (×3): qty 2

## 2015-01-15 MED ORDER — IBUPROFEN 400 MG PO TABS
400.0000 mg | ORAL_TABLET | Freq: Three times a day (TID) | ORAL | Status: DC | PRN
  Administered 2015-01-15 – 2015-01-18 (×2): 400 mg via ORAL
  Filled 2015-01-15 (×3): qty 1

## 2015-01-15 MED ORDER — METFORMIN HCL 500 MG PO TABS
1000.0000 mg | ORAL_TABLET | Freq: Two times a day (BID) | ORAL | Status: DC
  Administered 2015-01-15 – 2015-01-21 (×12): 1000 mg via ORAL
  Administered 2015-01-21: 17:00:00 via ORAL
  Administered 2015-01-22 – 2015-01-23 (×3): 1000 mg via ORAL
  Filled 2015-01-15 (×20): qty 2

## 2015-01-15 MED ORDER — NICOTINE 21 MG/24HR TD PT24: 21.0000 mg | MEDICATED_PATCH | Freq: Every day | TRANSDERMAL | Status: DC | PRN

## 2015-01-15 MED ORDER — ALUM & MAG HYDROXIDE-SIMETH 200-200-20 MG/5ML PO SUSP
30.0000 mL | ORAL | Status: DC | PRN
  Administered 2015-01-20 (×2): 30 mL via ORAL
  Filled 2015-01-15 (×2): qty 30

## 2015-01-15 MED ORDER — BUPROPION HCL ER (SR) 150 MG PO TB12
150.0000 mg | ORAL_TABLET | Freq: Every day | ORAL | Status: DC
  Administered 2015-01-16: 150 mg via ORAL
  Filled 2015-01-15 (×3): qty 1

## 2015-01-15 NOTE — ED Notes (Signed)
Pt. Noted sleeping in room. No complaints or concerns voiced. No distress or abnormal behavior noted. Will continue to monitor with security cameras. Q 15 minute rounds continue. 

## 2015-01-15 NOTE — Progress Notes (Signed)
Medicaid [place] access pcp is [facility]  [address] [place], [place] [address] [phone]

## 2015-01-15 NOTE — Tx Team (Signed)
Initial Interdisciplinary Treatment Plan   PATIENT STRESSORS: Health problems Loss of brother 2 weeks ago Substance abuse   PATIENT STRENGTHS: Ability for insight Average or above average intelligence General fund of knowledge Supportive family/friends   PROBLEM LIST: Problem List/Patient Goals Date to be addressed Date deferred Reason deferred Estimated date of resolution  Depression  01/15/15     si thoughts 01/15/15                                                DISCHARGE CRITERIA:  Ability to meet basic life and health needs Adequate post-discharge living arrangements Improved stabilization in mood, thinking, and/or behavior Medical problems require only outpatient monitoring Motivation to continue treatment in a less acute level of care Need for constant or close observation no longer present Reduction of life-threatening or endangering symptoms to within safe limits Safe-care adequate arrangements made Verbal commitment to aftercare and medication compliance Withdrawal symptoms are absent or subacute and managed without 24-hour nursing intervention  PRELIMINARY DISCHARGE PLAN: Outpatient therapy  PATIENT/FAMIILY INVOLVEMENT: This treatment plan has been presented to and reviewed with the patient, [name], and/or family member, .  The patient and family have been given the opportunity to ask questions and make suggestions.  [name] 01/15/2015, 7:10 PM

## 2015-01-15 NOTE — Progress Notes (Signed)
D:Patient in the hallway on approach.  Patient states he is new to the unit.  Patient states he is still depressed.  Patient states he has passive SI but verbally contracts for safety.  Patient denies HI.  Patient states he has auditory hallucination but states they are mumbles and he cannot make out what they are saying.  Patient did not engage in much conversation with Clinical research associate. A: Staff to monitor Q 15 mins for safety.  Encouragement and support offered.  Scheduled medications administered per orders. R: Patient remains safe on the unit.  Patient attended group tonight.  Patient visible on the unit and interacting with peers.  Patient taking administered medications.
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
## 2015-01-15 NOTE — Progress Notes (Signed)
CSW seeking inpatient Psych bed(s) for patient.   Referred to: [facility], [facility], [facility], [facility], [facility], [facility], [facility], [facility] (message left- will fax if availability)    Kariem Wolfson, MSW, LCSWA [phone] 

## 2015-01-15 NOTE — Progress Notes (Signed)
The focus of this group is to help patients review their daily goal of treatment and discuss progress on daily workbooks Pt attended the evening group session and responded to all discussion prompts from the Writer. Pt shared that today was a good day on the unit, the highlight of which was arriving here. Pt shared that his only goal going forward was "just to get better any way I can." Pt reported having everything he needed this first night and was encouraged to let staff know if he had questions or needed supplies. Pt's affect was appropriate.

## 2015-01-15 NOTE — ED Notes (Signed)
Pt is ready for discharge.  I told him it is too early for ibuprofen for his headache.  Encouraged fluids and gave emotional support.
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
## 2015-01-15 NOTE — ED Notes (Addendum)
Pt complains of hopelessness, he states "I just dont want to live any more"  He does however contract for safety.  He stated that since his brothers death he has become more and more depressed.  He states he no longer wants to hurt anyone else, just himself. He complains of an ongoing headache for several days with no relief from ibuprofen.  Pt was encouraged to drink plenty of water.  He remains safe on floor with fifteen minute checks in place. Pt also denies AVH.

## 2015-01-16 ENCOUNTER — Encounter (HOSPITAL_COMMUNITY): Payer: Self-pay | Admitting: Psychiatry

## 2015-01-16 DIAGNOSIS — Z634 Disappearance and death of family member: Secondary | ICD-10-CM

## 2015-01-16 DIAGNOSIS — F1220 Cannabis dependence, uncomplicated: Secondary | ICD-10-CM

## 2015-01-16 DIAGNOSIS — F1420 Cocaine dependence, uncomplicated: Secondary | ICD-10-CM

## 2015-01-16 LAB — GLUCOSE, CAPILLARY
Glucose-Capillary: 135 mg/dL — ABNORMAL HIGH (ref 65–99)
Glucose-Capillary: 117 mg/dL — ABNORMAL HIGH (ref 65–99)
Glucose-Capillary: 150 mg/dL — ABNORMAL HIGH (ref 65–99)
Glucose-Capillary: 171 mg/dL — ABNORMAL HIGH (ref 65–99)

## 2015-01-16 MED ORDER — FLUTICASONE PROPIONATE 50 MCG/ACT NA SUSP
2.0000 | Freq: Every day | NASAL | Status: DC
  Administered 2015-01-16 – 2015-01-23 (×8): 2 via NASAL
  Filled 2015-01-16: qty 16

## 2015-01-16 MED ORDER — NAPROXEN 375 MG PO TABS
375.0000 mg | ORAL_TABLET | Freq: Three times a day (TID) | ORAL | Status: DC
  Administered 2015-01-16 – 2015-01-23 (×22): 375 mg via ORAL
  Filled 2015-01-16 (×26): qty 1

## 2015-01-16 MED ORDER — BUPROPION HCL ER (SR) 100 MG PO TB12
100.0000 mg | ORAL_TABLET | Freq: Every day | ORAL | Status: DC
  Filled 2015-01-16: qty 1

## 2015-01-16 MED ORDER — LAMOTRIGINE 25 MG PO TABS
25.0000 mg | ORAL_TABLET | Freq: Every day | ORAL | Status: DC
  Administered 2015-01-16 – 2015-01-20 (×5): 25 mg via ORAL
  Filled 2015-01-16 (×8): qty 1

## 2015-01-16 MED ORDER — HALOPERIDOL 5 MG PO TABS
5.0000 mg | ORAL_TABLET | Freq: Every day | ORAL | Status: DC
  Administered 2015-01-16: 5 mg via ORAL
  Filled 2015-01-16 (×3): qty 1

## 2015-01-16 MED ORDER — HYDROCERIN EX CREA
TOPICAL_CREAM | Freq: Two times a day (BID) | CUTANEOUS | Status: DC
  Administered 2015-01-16 – 2015-01-23 (×6): via TOPICAL
  Filled 2015-01-16: qty 113

## 2015-01-16 MED ORDER — BENZTROPINE MESYLATE 0.5 MG PO TABS
0.5000 mg | ORAL_TABLET | Freq: Every day | ORAL | Status: DC
  Administered 2015-01-16 – 2015-01-18 (×3): 0.5 mg via ORAL
  Filled 2015-01-16 (×4): qty 1

## 2015-01-16 MED ORDER — LORATADINE 10 MG PO TABS
10.0000 mg | ORAL_TABLET | Freq: Every day | ORAL | Status: DC
  Administered 2015-01-16 – 2015-01-23 (×8): 10 mg via ORAL
  Filled 2015-01-16 (×10): qty 1

## 2015-01-16 NOTE — [facility] Group Notes (Signed)
[facility] LCSW Group Therapy  01/16/2015 , 1:52 PM   Type of Therapy:  Group Therapy  Participation Level:  Active  Participation Quality:  Attentive  Affect:  Appropriate  Cognitive:  Alert  Insight:  Improving  Engagement in Therapy:  Engaged  Modes of Intervention:  Discussion, Exploration and Socialization  Summary of Progress/Problems: Today's group focused on the term Diagnosis.  Participants were asked to define the term, and then pronounce whether it is a negative, positive or neutral term.  [name] was engaged throughout, and was willing to participate when called upon.  Stated that his health related to diabetes is a stressor, that his mental health is a stressor as are family relationships.  Cited spending time with his grandchildren both as a distraction from usual stress, but also as a destressor as it helps him to relax when with laughing children.  [name] 01/16/2015 , 1:52 PM

## 2015-01-16 NOTE — Progress Notes (Signed)
D:Patient in his room in bed on approach.  Patient states he had a good day but states he continues to have a headache.  Patient refused medications that he had available for a headache.  Patient states he continues to be depressed.  Patient states he is passive SI but verbally contracts for safety.  Patient denies HI.  Patient states he is having auditory hallucinations talking him to harm himself.  Patient verbally contracts for safety. A: Staff to monitor Q 15 mins for safety.  Encouragement and support offered.  Scheduled medications administered per orders. R: Patient remains safe on the unit.  Patient attended group tonight.  Patient minimally visible on the unit.  Patient taking administered medications.

## 2015-01-16 NOTE — [facility] Suicide Risk Assessment (Signed)
[facility] Admission Suicide Risk Assessment   Nursing information obtained from:  Patient Demographic factors:  Male, Unemployed Current Mental Status:  Suicidal ideation indicated by patient, Suicide plan, Belief that plan would result in death Loss Factors:  Loss of significant relationship Historical Factors:  Prior suicide attempts Risk Reduction Factors:  NA Total Time spent with patient: 30 minutes Principal Problem: Schizoaffective disorder, depressive type Diagnosis:   Patient Active Problem List   Diagnosis Date Noted  . Schizoaffective disorder, depressive type [F25.1] 01/16/2015  . Bereavement [Z63.4] 01/16/2015  . Cocaine use disorder, severe, dependence [F14.20] 01/16/2015  . Cannabis use disorder, moderate, dependence [F12.20] 01/16/2015  . Suicidal ideation [R45.851] 11/09/2013  . Diabetes mellitus [E11.9] 03/11/2012  . HTN (hypertension) [I10] 03/11/2012     Continued Clinical Symptoms:    The "Alcohol Use Disorders Identification Test", Guidelines for Use in Primary Care, Second Edition.  World Science writer [facility]). Score between 0-7:  no or low risk or alcohol related problems. Score between 8-15:  moderate risk of alcohol related problems. Score between 16-19:  high risk of alcohol related problems. Score 20 or above:  warrants further diagnostic evaluation for alcohol dependence and treatment.   CLINICAL FACTORS:   Alcohol/Substance Abuse/Dependencies Previous Psychiatric Diagnoses and Treatments Medical Diagnoses and Treatments/Surgeries   Musculoskeletal: Strength & Muscle Tone: within normal limits Gait & Station: normal Patient leans: N/A  Psychiatric Specialty Exam: Physical Exam  Review of Systems  Psychiatric/Behavioral: Positive for depression, suicidal ideas, hallucinations and substance abuse. The patient is nervous/anxious.   All other systems reviewed and are negative.   Blood pressure 104/41, pulse 88, temperature 97.6 F (36.4 C),  temperature source Oral, resp. rate 14, height 5' 7.58" (1.717 m), weight 97.297 kg (214 lb 8 oz).Body mass index is 33 kg/(m^2).            Please see H&P.                                              COGNITIVE FEATURES THAT CONTRIBUTE TO RISK:  Closed-mindedness, Polarized thinking and Thought constriction (tunnel vision)    SUICIDE RISK:   Moderate:  Frequent suicidal ideation with limited intensity, and duration, some specificity in terms of plans, no associated intent, good self-control, limited dysphoria/symptomatology, some risk factors present, and identifiable protective factors, including available and accessible social support.  PLAN OF CARE: Please see H&P.   Medical Decision Making:  Review of Psycho-Social Stressors (1), Review or order clinical lab tests (1), Review and summation of old records (2), Established Problem, Worsening (2), Review of Last Therapy Session (1), Review of Medication Regimen & Side Effects (2) and Review of New Medication or Change in Dosage (2)  I certify that inpatient services furnished can reasonably be expected to improve the patient's condition.   Ahsan Esterline MD 01/16/2015, 11:49 AM

## 2015-01-16 NOTE — Progress Notes (Signed)
Chaplain responding to spiritual care consult re: grief and loss.  Ryosuke's brother recently deceased.  Introduced spiritual care as resource.  Cain was seated on bed in room. He was welcoming of chaplain presence, acknowledged loss of brother, but appeared to minimize impact of grief, stating "I'm ok.  I'm doing better."  Chaplain provided brief support and empathic presence around loss and worked to build therapeutic rapport in this initial encounter.  Will continue to follow pt and offer support around grief as able.    Belva Crome MDiv

## 2015-01-16 NOTE — BHH Counselor (Signed)
Adult Comprehensive Assessment  Patient ID: Cicero Noy, male DOB: June 18, 1961, 54 y.o. MRN: [id number]  Information Source: Information source: Patient  Current Stressors:  Employment / Job issues: None - patient is disabled Surveyor, quantity / Lack of resources (include bankruptcy): Fixed income Housing / Lack of housing: Plans on returning to [place] to stay with mom Physical health (include injuries & life threatening diseases): Diabetes, Hep C Social relationships: None Substance abuse: Abuses crack cocaine and THC Bereavement:  States his brother was just killed in MVA in [place] when he was visiting there last month  Living/Environment/Situation:  Living Arrangements: When he left here a year ago, went to [facility] for approx. 2 months.  From there returned to his mother's in [place].  Recently came to [place] to relocate, but ran into multiple challenges.  Cannot get services here without transferring [facility] from [facility].  Also found out his roommate was using alcohol and drugs, and consequently pt relapsed as well.  Plans to return to his mom's at d/c. Living conditions (as described by patient or guardian): fair How long has patient lived in current situation?: On and off all my life What is atmosphere in current home: Supportive, comfortable  Family History:  Marital status: Divorced Divorced, when?: "Couple of years" What types of issues is patient dealing with in the relationship?: None Does patient have children?: Yes How many children?: 1 How is patient's relationship with their children?: Good relationship with adult daughter  Childhood History:  By whom was/is the patient raised?: Mother Additional childhood history information: Good childhood Description of patient's relationship with caregiver when they were a child: Good relationship Patient's description of current relationship with people who raised him/her: Very good relationship Does patient have  siblings?: Yes Number of Siblings: 1 Description of patient's current relationship with siblings: Good relationship with sister Did patient suffer from severe childhood neglect?: No Has patient ever been sexually abused/assaulted/raped as an adolescent or adult?: No Was the patient ever a victim of a crime or a disaster?: No Witnessed domestic violence?: No Has patient been effected by domestic violence as an adult?: No  Education:  Currently a Consulting civil engineer?: No Learning disability?: No  Employment/Work Situation:  Employment situation: On disability Why is patient on disability: Mental Health How long has patient been on disability: One year Patient's job has been impacted by current illness: No What is the longest time patient has a held a job?: 13 years 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 Has patient ever been in the [employer]?: No Has patient ever served in Buyer, retail?: No  Financial Resources:  Surveyor, quantity resources: Administrator, Civil Service SSDI;Medicaid;Medicare Does patient have a representative payee or guardian?: No  Alcohol/Substance Abuse:  If attempted suicide, did drugs/alcohol play a role in this?: No Alcohol/Substance Abuse Treatment Hx: Past Tx, Inpatient If yes, describe treatment: [facility], [facility] Has alcohol/substance abuse ever caused legal problems?: No  Social Support System:  Conservation officer, nature Support System: Fair Museum/gallery exhibitions officer System: Mother Type of faith/religion: None How does patient's faith help to cope with current illness?: N/A  Leisure/Recreation:  Leisure and Hobbies: Watch TV  Strengths/Needs:  What things does the patient do well?: Kind to others In what areas does patient struggle / problems for patient: Drug addiction and depression  Discharge Plan:  Does patient have access to transportation?: Yes Will patient be returning to same living situation after discharge?: No Plan  for living situation after discharge: Return to [place] with  mother Currently receiving community mental health services: No If no, would patient like referral for services when discharged?: Yes (What county?) [place] Does patient have financial barriers related to discharge medications?: No  Summary/Recommendations: Girish Levit is a 54 year old African American male admitted with Schizoaffective Disorder.As with previous admissions, he was positive for cocaine and cannabis, of course stating that he only recently relapsed.  Once stabilized here, he plans to return to [place] and [facility]  He will benefit from crisis stabilization, evaluation for medication, psycho-education groups for coping skills development, group therapy and case management for discharge planning.     Daryel Gerald 01/16/2015

## 2015-01-16 NOTE — Plan of Care (Signed)
Problem: Ineffective individual coping Goal: STG: Patient will remain free from self harm Outcome: Progressing Pt endorsed SI with initial plan to shoot self, when assessed. Verbally contracted for safety, agreed to come to staff with urge to harm self to promote safety. No gestures or incident of self injurious behavior to note thus far this shift.

## 2015-01-16 NOTE — Progress Notes (Signed)
D: Pt presents with flat affect and depressed mood. Pt reports positive AH, states the "voices are telling me to hurt myself", endorsed SI but contracts for safety. Verbally agreed to come to staff with urge to harm self to promote safety. Pt rated his depression 10/10, hopelessness 10/10 anxiety 10/10. PRN antianxiety medication need was discussed with pt as an option for his reported anxiety level, but he declined; stated "I ok for now, will let you know if it gets worse". Pt's goal for today is "Not hurting myself" which he plans to do by "nothing". Chaplain met with pt for grief related issues as pt recently lost his brother. Pt attended all scheduled groups on unit. Pt has been engaged with others but minimally. A: Verbal education done on ordered medications prior to administration. 1:1 contact made with pt to address needs and concerns. Emotional support and availability offered. Pt encouraged to voice needs throughout this shift. Q 15 minutes checks remains effective for pt's safety as ordered.  R: Pt compliant with current treatment regimen. Denied adverse medication reactions when assessed. Continue POC.

## 2015-01-16 NOTE — Progress Notes (Signed)
Patient ID: [name], male   DOB: [birth year], 54 y.o.   MRN: [id number] PER STATE REGULATIONS 482.30  THIS CHART WAS REVIEWED FOR MEDICAL NECESSITY WITH RESPECT TO THE PATIENT'S ADMISSION/DURATION OF STAY.  NEXT REVIEW DATE: 01/19/15  Loura Halt, RN, BSN CASE MANAGER

## 2015-01-16 NOTE — BHH Group Notes (Signed)
B[facility] Group Notes:  (Nursing/ Recovery)  Date:  01/16/2015  Time:  0930  Type of Therapy:  Psychoeducational Skills  Participation Level:  Active  Participation Quality:  Appropriate, Attentive, Sharing and Supportive  Affect:  Flat  Cognitive:  Alert, Appropriate and Oriented  Insight:  Appropriate and Good  Engagement in Group:  Engaged and Supportive  Modes of Intervention:  Discussion, Education, Exploration, Orientation and Support  Summary of Progress/Problems: Pt attended and was engaged in group.  Ouida Sills, Lincoln Maxin 01/16/2015, 0930

## 2015-01-16 NOTE — Tx Team (Signed)
Interdisciplinary Treatment Plan Update (Adult)  Date:  01/16/2015   Time Reviewed:  8:23 AM   Progress in Treatment: Attending groups: Yes. Participating in groups:  Yes. Taking medication as prescribed:  Yes. Tolerating medication:  Yes. Family/Significant othe contact made:  No Patient understands diagnosis:  Yes  As evidenced by seeking help with SI secondary to depression, and psychosis Discussing patient identified problems/goals with staff:  Yes, see initial care plan. Medical problems stabilized or resolved:  Yes. Denies suicidal/homicidal ideation: Yes. Issues/concerns per patient self-inventory:  No. Other:  New problem(s) identified:  Discharge Plan or Barriers:  Return to [place], follow up outpt  Reason for Continuation of Hospitalization: Depression Hallucinations Medication stabilization  Comments:  Pt arrived to the ED with a complaint of suicidal thoughts. Pt states he has been on medication but it is not working. Pt states he is a patient at [facility] [place]. Pt states it has been a month since he has seen them. Pt is complaining of a headache that has been present for 22 days. Pt states he has been having suicidal thoughts for the same period of time. Pt states he wants to shoot himself with a firearm but does not own a firearm.  Estimated length of stay:  4-5 days  New goal(s):  Review of initial/current patient goals per problem list:     Attendees: Patient:  01/16/2015 8:23 AM   Family:   01/16/2015 8:23 AM   Physician:  Jomarie Longs, MD 01/16/2015 8:23 AM   Nursing:   Lendell Caprice, RN 01/16/2015 8:23 AM   CSW:    Daryel Gerald, LCSW   01/16/2015 8:23 AM   Other:  01/16/2015 8:23 AM   Other:   01/16/2015 8:23 AM   Other:  Onnie Boer, Nurse CM 01/16/2015 8:23 AM   Other:  Leisa Lenz, [facility] 01/16/2015 8:23 AM   Other:  Tomasita Morrow, P4CC  01/16/2015 8:23 AM   Other:  01/16/2015 8:23 AM   Other:  01/16/2015 8:23 AM   Other:   01/16/2015 8:23 AM   Other:  01/16/2015 8:23 AM   Other:  01/16/2015 8:23 AM   Other:   01/16/2015 8:23 AM    Scribe for Treatment Team:   Ida Rogue, 01/16/2015 8:23 AM

## 2015-01-16 NOTE — H&P (Addendum)
Psychiatric Admission Assessment Adult  Patient Identification: [name] MRN:  [id number] Date of Evaluation:  01/16/2015 Chief Complaint: Patient states " I feel suicidal since my brother died 2 weeks ago.'    Principal Diagnosis: Schizoaffective disorder, depressive type Diagnosis:   Patient Active Problem List   Diagnosis Date Noted  . Schizoaffective disorder, depressive type [F25.1] 01/16/2015  . Bereavement [Z63.4] 01/16/2015  . Cocaine use disorder, severe, dependence [F14.20] 01/16/2015  . Cannabis use disorder, moderate, dependence [F12.20] 01/16/2015  . Suicidal ideation [R45.851] 11/09/2013  . Diabetes mellitus [E11.9] 03/11/2012  . HTN (hypertension) [I10] 03/11/2012       History of Present Illness:: [name] is a 54 y.o.  AA male who is divorced , on SSD , presented to  [facility] seeking support for depression and suicidal ideation. Patient with hx of Schizoaffective do as well as polysubstance abuse - per initial notes in EHR "  Reported  that his brother was killed in a car accident a couple of weeks ago and that he feels tremendous guilt because he wasn't able to save him. He also stated that he has been feeling suicidal for about a week and that he wants to end his life by shooting himself, but that he gave his gun to a friend. Pt also reported thoughts about choking his girlfriend who has not been supportive.'   Patient seen this AM. Patient appeared to be depressed , focussed on his brother's death and its effect on his mental health. Pt reports that he was living in [place] until a few weeks ago,relocated here , and now has trouble transferring his insurance here. Pt reports having been on prozac as well as wellbutrin , but reports no effect from both. Pt also reports being on Risperidone in the past - but his psychiatrist in [place] took him off it , since it was affecting his blood sugar level - since he has a hx of DM.  Patient today reports sadness,  hopelessness, tearfulness, guilt as well as SI . Patient had a plan to shoot self prior to admission, went to get a gun from his friend's house, but was not able to. Pt currently denies plan.Pt also had HI on admission towards girlfriend - but currently has decided to let it go .  Pt with AH asking him to hurt self or others. Pt also with anxiety attacks - when he feels restless, racing heart , as well as sweats .  Pt reports that he used to abuse a lot of cocaine - two years ago, but was sober for almost 2 years - relapsed 2 weeks ago due to his brother's death. Pt denies alcohol abuse, tobacco use or other illicit drugs.  Pt has been to several mental health facilities in the past - [facility] [place] , [place] (1999).  Pt reports 3 suicide attempts in the past - OD X2, tried to shoot with a gun , but gun jammed .  Pt also with c/o headache - has pressure BL temporal side - positive for tenderness- mild - denies blurry vision - pt was treated for sinusitis recently - but headache persists.    Elements:  Location:  depression , psychosis. Quality:  see above. Severity:  severe. Timing:  acute. Duration:  past 2-3 weeks. Context:  hx of schizoaffective disorder, death in family, cocaine abuse. Associated Signs/Symptoms: Depression Symptoms:  depressed mood, anhedonia, fatigue, feelings of worthlessness/guilt, difficulty concentrating, hopelessness, suicidal thoughts with specific plan, anxiety, loss of energy/fatigue, (Hypo) Manic Symptoms:  Hallucinations, Anxiety Symptoms:  anxiety attacks Psychotic Symptoms:  Hallucinations: Auditory PTSD Symptoms: Negative Total Time spent with patient: 1 hour  Past Medical History:  Past Medical History  Diagnosis Date  . Diabetes mellitus   . Bipolar affective disorder   . Schizophrenia, schizo-affective   . Hypertension   . Diabetes mellitus 03/11/2012  . Bipolar disorder 03/11/2012  . Anxiety   . Depression   . Hepatitis 06/30/2013     Type C    Past Surgical History  Procedure Laterality Date  . Finger surgery    . Wisdom tooth extraction     Family History:  Family History  Problem Relation Age of Onset  . Diabetes Mother   . Bipolar disorder Sister    Social History:  History  Alcohol Use No     History  Drug Use  . Yes  . Special: Marijuana, Cocaine    Comment: 4 days ago last THC and cocoaine use... relapsed after 2 yrs     History   Social History  . Marital Status: Single    Spouse Name: N/A  . Number of Children: N/A  . Years of Education: N/A   Social History Main Topics  . Smoking status: Never Smoker   . Smokeless tobacco: Never Used  . Alcohol Use: No  . Drug Use: Yes    Special: Marijuana, Cocaine     Comment: 4 days ago last THC and cocoaine use... relapsed after 2 yrs   . Sexual Activity: Yes    Birth Control/ Protection: Condom     Comment: marijuana 25 days ago   Other Topics Concern  . None   Social History Narrative   Additional Social History:    History of alcohol / drug use?: Yes Longest period of sobriety (when/how long): 17 yrs          Patient was born in [place]. Patient graduated HS. Pt was married , but divorced now - has a one daughter who is 23 y old. Pt used to work in [employer] in the past , is currently on SSD. Pt has a mother (adopted mother - he was adopted at 2 weeks) in [place]. Pt was in prison for a year in the past- relational issues.           Musculoskeletal: Strength & Muscle Tone: within normal limits Gait & Station: normal Patient leans: N/A  Psychiatric Specialty Exam: Physical Exam  Constitutional: He is oriented to person, place, and time. He appears well-developed and well-nourished.  HENT:  Head: Normocephalic and atraumatic.  Eyes: Conjunctivae and EOM are normal. Pupils are equal, round, and reactive to light.  Neck: Normal range of motion. Neck supple. No tracheal deviation present. No thyromegaly  present.  Cardiovascular: Normal rate and regular rhythm.   Respiratory: Effort normal and breath sounds normal.  GI: Soft. Bowel sounds are normal.  Musculoskeletal: Normal range of motion.  Neurological: He is alert and oriented to person, place, and time.  Skin: Skin is warm.  Psychiatric: His speech is normal. His mood appears anxious. He is actively hallucinating. Cognition and memory are normal. He expresses impulsivity. He exhibits a depressed mood. He expresses suicidal ideation.    Review of Systems  Neurological: Positive for headaches.  Psychiatric/Behavioral: Positive for depression, suicidal ideas, hallucinations and substance abuse. The patient is nervous/anxious.   All other systems reviewed and are negative.   Blood pressure 131/91, pulse 69, temperature 97.9 F (36.6 C), temperature source Oral, resp. rate  18, height 5' 7.58" (1.717 m), weight 97.297 kg (214 lb 8 oz).Body mass index is 33 kg/(m^2).  General Appearance: Fairly Groomed  Patent attorney::  Fair  Speech:  Normal Rate  Volume:  Decreased  Mood:  Anxious, Depressed and Hopeless  Affect:  Restricted  Thought Process:  Linear  Orientation:  Full (Time, Place, and Person)  Thought Content:  Hallucinations: Auditory Command:  asking him to kill self and others and Rumination  Suicidal Thoughts:  Yes.  without intent/plan  Homicidal Thoughts:  was HI towards GF on admission  Memory:  Immediate;   Fair Recent;   Fair Remote;   Fair  Judgement:  Impaired  Insight:  Shallow  Psychomotor Activity:  Decreased  Concentration:  Poor  Recall:  [employer] of Knowledge:Fair  Language: Fair  Akathisia:  No  Handed:  Right  AIMS (if indicated):     Assets:  Communication Skills Desire for Improvement Social Support  ADL's:  Intact  Cognition: WNL  Sleep:  Number of Hours: 6.5   Risk to Self: Is patient at risk for suicide?: Yes Risk to Others:  yes Prior Inpatient Therapy:  yes- see above Prior Outpatient  Therapy:  Yes- did go to [facility]  Alcohol Screening: 1. How often do you have a drink containing alcohol?: Never 9. Have you or someone else been injured as a result of your drinking?: No 10. Has a relative or friend or a doctor or another health worker been concerned about your drinking or suggested you cut down?:  (Pt reports not drinking in 17 yrs)  Allergies:   Allergies  Allergen Reactions  . Wellbutrin [Bupropion] Swelling and Hives    Other reaction(s): Unknown   Lab Results:  Results for orders placed or performed during the hospital encounter of 01/15/15 (from the past 48 hour(s))  Glucose, capillary     Status: Abnormal   Collection Time: 01/15/15  6:30 PM  Result Value Ref Range   Glucose-Capillary 153 (H) 65 - 99 mg/dL  Glucose, capillary     Status: Abnormal   Collection Time: 01/15/15  8:35 PM  Result Value Ref Range   Glucose-Capillary 134 (H) 65 - 99 mg/dL   Comment 1 Notify RN   Glucose, capillary     Status: Abnormal   Collection Time: 01/16/15  5:59 AM  Result Value Ref Range   Glucose-Capillary 135 (H) 65 - 99 mg/dL   Comment 1 Notify RN   Glucose, capillary     Status: Abnormal   Collection Time: 01/16/15 11:37 AM  Result Value Ref Range   Glucose-Capillary 117 (H) 65 - 99 mg/dL   Comment 1 Notify RN    Comment 2 Document in Chart    Current Medications: Current Facility-Administered Medications  Medication Dose Route Frequency Provider Last Rate Last Dose  . acetaminophen (TYLENOL) tablet 650 mg  650 mg Oral Q4H PRN Jimmy Footman, MD      . alum & mag hydroxide-simeth (MAALOX/MYLANTA) 200-200-20 MG/5ML suspension 30 mL  30 mL Oral PRN Jimmy Footman, MD      . benztropine (COGENTIN) tablet 0.5 mg  0.5 mg Oral QHS Uchechukwu Dhawan, MD      . fluticasone (FLONASE) 50 MCG/ACT nasal spray 2 spray  2 spray Each Nare Daily Jomarie Longs, MD   2 spray at 01/16/15 1137  . glipiZIDE (GLUCOTROL) tablet 10 mg  10 mg Oral BID AC Jimmy Footman, MD   10 mg at 01/16/15 1848  . haloperidol (  HALDOL) tablet 5 mg  5 mg Oral QHS Lisabeth Mian, MD      . hydrocerin (EUCERIN) cream   Topical BID Jomarie Longs, MD      . ibuprofen (ADVIL,MOTRIN) tablet 400 mg  400 mg Oral Q8H PRN Jimmy Footman, MD   400 mg at 01/15/15 2051  . insulin aspart (novoLOG) injection 0-9 Units  0-9 Units Subcutaneous TID WC Jimmy Footman, MD   1 Units at 01/16/15 [phone]  . insulin detemir (LEVEMIR) injection 40 Units  40 Units Subcutaneous QHS Jimmy Footman, MD   40 Units at 01/15/15 2201  . lamoTRIgine (LAMICTAL) tablet 25 mg  25 mg Oral Daily Jomarie Longs, MD   25 mg at 01/16/15 1137  . loratadine (CLARITIN) tablet 10 mg  10 mg Oral Daily Jomarie Longs, MD   10 mg at 01/16/15 1138  . metFORMIN (GLUCOPHAGE) tablet 1,000 mg  1,000 mg Oral BID WC Jimmy Footman, MD   1,000 mg at 01/16/15 0805  . naproxen (NAPROSYN) tablet 375 mg  375 mg Oral TID WC Jomarie Longs, MD   375 mg at 01/16/15 1138  . nicotine (NICODERM CQ - dosed in mg/24 hours) patch 21 mg  21 mg Transdermal Daily PRN Jimmy Footman, MD      . ondansetron [facility]) tablet 4 mg  4 mg Oral Q8H PRN Jimmy Footman, MD      . traZODone (DESYREL) tablet 50 mg  50 mg Oral QHS PRN Jimmy Footman, MD       PTA Medications: Prescriptions prior to admission  Medication Sig Dispense Refill Last Dose  . aspirin-acetaminophen-caffeine (EXCEDRIN MIGRAINE) 250-250-65 MG per tablet Take 1 tablet by mouth every 6 (six) hours as needed for headache.   01/13/2015  . buPROPion (WELLBUTRIN SR) 150 MG 12 hr tablet Take 150 mg by mouth 2 (two) times daily.   01/13/2015  . FLUoxetine (PROZAC) 40 MG capsule Take 40 mg by mouth daily.   01/13/2015  . glipiZIDE (GLUCOTROL) 10 MG tablet Take 10 mg by mouth 2 (two) times daily before a meal.   01/13/2015  . insulin aspart (NOVOLOG FLEXPEN) 100 UNIT/ML FlexPen Inject 3-10 Units into the skin  3 (three) times daily with meals. Per sliding scale: for diabetes management   01/13/2015  . insulin detemir (LEVEMIR) 100 UNIT/ML injection Inject 42 Units into the skin at bedtime.   01/13/2015  . metFORMIN (GLUCOPHAGE) 1000 MG tablet Take 1,000 mg by mouth 2 (two) times daily with a meal.   01/13/2015    Previous Psychotropic Medications: Yes -wellbutrin , prozac, haldol, trazodone , prozac  Substance Abuse History in the last 12 months:  Yes.  cocaine - was sober for 2 years - relapsed again 2 weeks ago 2/2 death of brother    Consequences of Substance Abuse: Medical Consequences:  recent admission could have been contributed by substance abuse  Results for orders placed or performed during the hospital encounter of 01/15/15 (from the past 72 hour(s))  Glucose, capillary     Status: Abnormal   Collection Time: 01/15/15  6:30 PM  Result Value Ref Range   Glucose-Capillary 153 (H) 65 - 99 mg/dL  Glucose, capillary     Status: Abnormal   Collection Time: 01/15/15  8:35 PM  Result Value Ref Range   Glucose-Capillary 134 (H) 65 - 99 mg/dL   Comment 1 Notify RN   Glucose, capillary     Status: Abnormal   Collection Time: 01/16/15  5:59 AM  Result Value Ref  Range   Glucose-Capillary 135 (H) 65 - 99 mg/dL   Comment 1 Notify RN   Glucose, capillary     Status: Abnormal   Collection Time: 01/16/15 11:37 AM  Result Value Ref Range   Glucose-Capillary 117 (H) 65 - 99 mg/dL   Comment 1 Notify RN    Comment 2 Document in Chart     Observation Level/Precautions:  15 minute checks  Laboratory:  .Reveiwed CBC, CMP,UDS,BAL- Will order TSH, Lipid panel,Hba1c,EKG for qtc, PL  Psychotherapy:  Individual and group therapy   Medications:  AS BELOW  Consultations:  Chaplain, Child psychotherapist  Discharge Concerns:  Stability and safety       Psychological Evaluations: No   Treatment Plan Summary: Daily contact with patient to assess and evaluate symptoms and progress in treatment and Medication  management   Patient will benefit from inpatient treatment and stabilization.  Estimated length of stay is 5-7 days.  Reviewed past medical records,treatment plan.    Will start a trial of Haldol 5 mg po qhs for psychosis, mood sx. Will start Cogentin 0.5 mg po qhs for EPS. Will add Lamictal 25 mg po daily for mood lability, depression. Will add Trazodone 50 mg po qhs prn for sleep.  Patient with suicidal ideation ,has plan to shoot self - will be observed on the unit.   Continue Metformin 1000 mg po bid ,Levemir 40 units Spencer qhs, Glipizide 10 mg po bid for DM.  Will order Hba1c,CBG monitoring.  Will start Naproxen 375 mg po tid for headaches. Restart Flonase- nasal spray - once daily . Will add Claritine 10 mg po daily for allergies.  Will provide substance abuse counseling.   Will continue to monitor vitals ,medication compliance and treatment side effects while patient is here.  Will monitor for medical issues as well as call consult as needed CSW will start working on disposition.  Patient to participate in therapeutic milieu .       Medical Decision Making:  Review of Psycho-Social Stressors (1), Review or order clinical lab tests (1), Review and summation of old records (2), Established Problem, Worsening (2), Review of Last Therapy Session (1), Review of Medication Regimen & Side Effects (2) and Review of New Medication or Change in Dosage (2)  I certify that inpatient services furnished can reasonably be expected to improve the patient's condition.   Libi Corso MD 6/21/201612:24 PM

## 2015-01-17 LAB — LIPID PANEL
Cholesterol: 242 mg/dL — ABNORMAL HIGH (ref 0–200)
HDL: 41 mg/dL (ref >40)
LDL Cholesterol: 170 mg/dL — ABNORMAL HIGH (ref 0–99)
Total CHOL/HDL Ratio: 5.9 RATIO
Triglycerides: 156 mg/dL — ABNORMAL HIGH (ref <150)
VLDL: 31 mg/dL (ref 0–40)

## 2015-01-17 LAB — GLUCOSE, CAPILLARY
GLUCOSE-CAPILLARY: 111 mg/dL — AB (ref 65–99)
Glucose-Capillary: 153 mg/dL — ABNORMAL HIGH (ref 65–99)
Glucose-Capillary: 93 mg/dL (ref 65–99)
Glucose-Capillary: 149 mg/dL — ABNORMAL HIGH (ref 65–99)

## 2015-01-17 LAB — TSH: TSH: 1.906 u[IU]/mL (ref 0.350–4.500)

## 2015-01-17 MED ORDER — SIMVASTATIN 5 MG PO TABS
ORAL_TABLET | ORAL | Status: AC
  Filled 2015-01-17: qty 2

## 2015-01-17 MED ORDER — HALOPERIDOL 5 MG PO TABS
10.0000 mg | ORAL_TABLET | Freq: Every day | ORAL | Status: DC
  Administered 2015-01-17: 10 mg via ORAL
  Filled 2015-01-17 (×3): qty 2

## 2015-01-17 MED ORDER — SIMVASTATIN 10 MG PO TABS
10.0000 mg | ORAL_TABLET | Freq: Every day | ORAL | Status: DC
  Administered 2015-01-17 – 2015-01-22 (×6): 10 mg via ORAL
  Filled 2015-01-17 (×7): qty 1

## 2015-01-17 MED ORDER — TRAZODONE HCL 100 MG PO TABS
100.0000 mg | ORAL_TABLET | Freq: Every day | ORAL | Status: DC
  Administered 2015-01-17 – 2015-01-18 (×2): 100 mg via ORAL
  Filled 2015-01-17 (×4): qty 1

## 2015-01-17 NOTE — Progress Notes (Signed)
[facility] MD Progress Note  01/17/2015 5:07 PM [name]  MRN:  [id number] Subjective: Patient states " I am still having a headache. I also hear voices , feel anxious and have racing thoughts.'  Objective: Patient seen and chart reviewed.Discussed patient with treatment team. [name] is a 54 y.o. AA male who is divorced , on SSD , presented to [facility] seeking support for depression and suicidal ideation. Patient with hx of Schizoaffective do as well as polysubstance abuse - per initial notes in EHR " Reported that his brother was killed in a car accident a couple of weeks ago and that he feels tremendous guilt because he wasn't able to save him. He also stated that he has been feeling suicidal for about a week and that he wants to end his life by shooting himself, but that he gave his gun to a friend. Pt also reported thoughts about choking his girlfriend who has not been supportive.'  Pt today continues to appear depressed , anxious , as well as frustrated by his headaches. Pt reports having a recent hx of sinusitis, not fully resolved on medications- his headaches started with that . Pt was restarted on medications as well as given pain management. Discussed to give some more time . Pt also has AH , also endorses SI with no plan. Pt continues to have sleep issues as well as racing thoughts. Pt encouraged to take medications.        Principal Problem: Schizoaffective disorder, depressive type Diagnosis:   Patient Active Problem List   Diagnosis Date Noted  . Schizoaffective disorder, depressive type [F25.1] 01/16/2015  . Bereavement [Z63.4] 01/16/2015  . Cocaine use disorder, severe, dependence [F14.20] 01/16/2015  . Cannabis use disorder, moderate, dependence [F12.20] 01/16/2015  . Suicidal ideation [R45.851] 11/09/2013  . Diabetes mellitus [E11.9] 03/11/2012  . HTN (hypertension) [I10] 03/11/2012   Total Time spent with patient: 30 minutes   Past Medical History:  Past  Medical History  Diagnosis Date  . Diabetes mellitus   . Bipolar affective disorder   . Schizophrenia, schizo-affective   . Hypertension   . Diabetes mellitus 03/11/2012  . Bipolar disorder 03/11/2012  . Anxiety   . Depression   . 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 Finger surgery    . Wisdom tooth extraction     Family History:  Family History  Problem Relation Age of Onset  . Diabetes Mother   . Bipolar disorder Sister    Social History:  History  Alcohol Use No     History  Drug Use  . Yes  . Special: Marijuana, Cocaine    Comment: 4 days ago last THC and cocoaine use... relapsed after 2 yrs     History   Social History  . Marital Status: Single    Spouse Name: N/A  . Number of Children: N/A  . Years of Education: N/A   Social History Main Topics  . Smoking status: Never Smoker   . Smokeless tobacco: Never Used  . Alcohol Use: No  . Drug Use: Yes    Special: Marijuana, Cocaine     Comment: 4 days ago last THC and cocoaine use... relapsed after 2 yrs   . Sexual Activity: Yes    Birth Control/ Protection: Condom     Comment: marijuana 25 days ago   Other Topics Concern  . None   Social History Narrative   Additional History:  Sleep: Poor  Appetite:  Fair     Musculoskeletal: Strength & Muscle Tone: within normal limits Gait & Station: normal Patient leans: N/A   Psychiatric Specialty Exam: Physical Exam  Review of Systems  Neurological: Positive for headaches.  Psychiatric/Behavioral: Positive for depression, suicidal ideas, hallucinations and substance abuse. The patient is nervous/anxious.     Blood pressure 138/79, pulse 77, temperature 98.1 F (36.7 C), temperature source Oral, resp. rate 16, height 5' 7.58" (1.717 m), weight 97.297 kg (214 lb 8 oz).Body mass index is 33 kg/(m^2).  General Appearance: Fairly Groomed  Patent attorney::  Fair  Speech:  Clear and Coherent  Volume:   Normal  Mood:  Dysphoric  Affect:  Appropriate  Thought Process:  Coherent  Orientation:  Full (Time, Place, and Person)  Thought Content:  Hallucinations: Auditory  Suicidal Thoughts:  Yes.  without intent/plan  Homicidal Thoughts:  No  Memory:  Immediate;   Fair Recent;   Fair Remote;   Fair  Judgement:  Impaired  Insight:  Shallow  Psychomotor Activity:  Decreased  Concentration:  Poor  Recall:  [employer] of Knowledge:Fair  Language: Fair  Akathisia:  No  Handed:  Right  AIMS (if indicated):     Assets:  Communication Skills Desire for Improvement  ADL's:  Intact  Cognition: WNL  Sleep:  Number of Hours: 6.5     Current Medications: Current Facility-Administered Medications  Medication Dose Route Frequency Provider Last Rate Last Dose  . acetaminophen (TYLENOL) tablet 650 mg  650 mg Oral Q4H PRN Jimmy Footman, MD      . alum & mag hydroxide-simeth (MAALOX/MYLANTA) 200-200-20 MG/5ML suspension 30 mL  30 mL Oral PRN Jimmy Footman, MD      . benztropine (COGENTIN) tablet 0.5 mg  0.5 mg Oral QHS Jomarie Longs, MD   0.5 mg at 01/16/15 2132  . fluticasone (FLONASE) 50 MCG/ACT nasal spray 2 spray  2 spray Each Nare Daily Jomarie Longs, MD   2 spray at 01/17/15 1610  . glipiZIDE (GLUCOTROL) tablet 10 mg  10 mg Oral BID AC Jimmy Footman, MD   10 mg at 01/17/15 0636  . haloperidol (HALDOL) tablet 10 mg  10 mg Oral QHS Roger Kettles, MD      . hydrocerin (EUCERIN) cream   Topical BID Jomarie Longs, MD      . ibuprofen (ADVIL,MOTRIN) tablet 400 mg  400 mg Oral Q8H PRN Jimmy Footman, MD   400 mg at 01/15/15 2051  . insulin aspart (novoLOG) injection 0-9 Units  0-9 Units Subcutaneous TID WC Jimmy Footman, MD   2 Units at 01/17/15 [phone]  . insulin detemir (LEVEMIR) injection 40 Units  40 Units Subcutaneous QHS Jimmy Footman, MD   40 Units at 01/16/15 2133  . lamoTRIgine (LAMICTAL) tablet 25 mg  25 mg Oral Daily  Jomarie Longs, MD   25 mg at 01/17/15 0811  . loratadine (CLARITIN) tablet 10 mg  10 mg Oral Daily Jomarie Longs, MD   10 mg at 01/17/15 0811  . metFORMIN (GLUCOPHAGE) tablet 1,000 mg  1,000 mg Oral BID WC Jimmy Footman, MD   1,000 mg at 01/17/15 0811  . naproxen (NAPROSYN) tablet 375 mg  375 mg Oral TID WC Jomarie Longs, MD   375 mg at 01/17/15 1205  . nicotine (NICODERM CQ - dosed in mg/24 hours) patch 21 mg  21 mg Transdermal Daily PRN Jimmy Footman, MD      . ondansetron [facility]) tablet 4 mg  4 mg  Oral Q8H PRN Jimmy Footman, MD      . simvastatin (ZOCOR) tablet 10 mg  10 mg Oral q1800 Jomarie Longs, MD      . traZODone (DESYREL) tablet 100 mg  100 mg Oral QHS Jomarie Longs, MD        Lab Results:  Results for orders placed or performed during the hospital encounter of 01/15/15 (from the past 48 hour(s))  Glucose, capillary     Status: Abnormal   Collection Time: 01/15/15  6:30 PM  Result Value Ref Range   Glucose-Capillary 153 (H) 65 - 99 mg/dL  Glucose, capillary     Status: Abnormal   Collection Time: 01/15/15  8:35 PM  Result Value Ref Range   Glucose-Capillary 134 (H) 65 - 99 mg/dL   Comment 1 Notify RN   Glucose, capillary     Status: Abnormal   Collection Time: 01/16/15  5:59 AM  Result Value Ref Range   Glucose-Capillary 135 (H) 65 - 99 mg/dL   Comment 1 Notify RN   Glucose, capillary     Status: Abnormal   Collection Time: 01/16/15 11:37 AM  Result Value Ref Range   Glucose-Capillary 117 (H) 65 - 99 mg/dL   Comment 1 Notify RN    Comment 2 Document in Chart   Glucose, capillary     Status: Abnormal   Collection Time: 01/16/15  4:53 PM  Result Value Ref Range   Glucose-Capillary 150 (H) 65 - 99 mg/dL  Glucose, capillary     Status: Abnormal   Collection Time: 01/16/15  9:07 PM  Result Value Ref Range   Glucose-Capillary 171 (H) 65 - 99 mg/dL   Comment 1 Notify RN   Glucose, capillary     Status: Abnormal   Collection Time:  01/17/15  6:00 AM  Result Value Ref Range   Glucose-Capillary 153 (H) 65 - 99 mg/dL   Comment 1 Notify RN   TSH     Status: None   Collection Time: 01/17/15  6:25 AM  Result Value Ref Range   TSH 1.906 0.350 - 4.500 uIU/mL    Comment: Performed at [facility]  Lipid panel     Status: Abnormal   Collection Time: 01/17/15  6:25 AM  Result Value Ref Range   Cholesterol 242 (H) 0 - 200 mg/dL   Triglycerides 258 (H) <150 mg/dL   HDL 41 >52 mg/dL   Total CHOL/HDL Ratio 5.9 RATIO   VLDL 31 0 - 40 mg/dL   LDL Cholesterol 778 (H) 0 - 99 mg/dL    Comment:        Total Cholesterol/HDL:CHD Risk Coronary Heart Disease Risk Table                     Men   Women  1/2 Average Risk   3.4   3.3  Average Risk       5.0   4.4  2 X Average Risk   9.6   7.1  3 X Average Risk  23.4   11.0        Use the calculated Patient Ratio above and the CHD Risk Table to determine the patient's CHD Risk.        ATP III CLASSIFICATION (LDL):  <100     mg/dL   Optimal  242-353  mg/dL   Near or Above                    Optimal  130-159  mg/dL   Borderline  109-604  mg/dL   High  >540     mg/dL   Very High Performed at [facility]   Glucose, capillary     Status: None   Collection Time: 01/17/15 11:56 AM  Result Value Ref Range   Glucose-Capillary 93 65 - 99 mg/dL   Comment 1 Notify RN     Physical Findings: AIMS: Facial and Oral Movements Muscles of Facial Expression: None, normal Lips and Perioral Area: None, normal Jaw: None, normal Tongue: None, normal,Extremity Movements Upper (arms, wrists, hands, fingers): None, normal Lower (legs, knees, ankles, toes): None, normal, Trunk Movements Neck, shoulders, hips: None, normal, Overall Severity Severity of abnormal movements (highest score from questions above): None, normal Incapacitation due to abnormal movements: None, normal Patient's awareness of abnormal movements (rate only patient's report): No Awareness, Dental  Status Current problems with teeth and/or dentures?: No Does patient usually wear dentures?: No  CIWA:    COWS:     Assessment: Patient continues to endorse depression , AH as well as SI , denies plan. Will continue treatment.   Treatment Plan Summary: Daily contact with patient to assess and evaluate symptoms and progress in treatment and Medication management  Will increase Haldol to 10 mg po qhs for psychosis, mood sx. Will increase Cogentin 0.5 mg po qhs for EPS. Will continue Lamictal 25 mg po daily for mood lability, depression. Will increase Trazodone to 100 mg po qhs prn for sleep. TSH reviewed- wnl.  Patient with suicidal ideation ,no plan  will be observed on the unit.   Continue Metformin 1000 mg po bid ,Levemir 40 units [place] qhs, Glipizide 10 mg po bid for DM.  Will order Hba1c-RESULT PENDING  Will start Naproxen 375 mg po tid for headaches. Will add Tylenol prn for pain. Restarted Flonase- nasal spray - once daily . Will continue  Claritine 10 mg po daily for allergies. Could get CT scan head if headaches persists.  Will continue  substance abuse counseling.   Will continue to monitor vitals ,medication compliance and treatment side effects while patient is here.  Will monitor for medical issues as well as call consult as needed CSW will start working on disposition.  Patient to participate in therapeutic milieu .   Medical Decision Making:  Review of Psycho-Social Stressors (1), Review or order clinical lab tests (1), Established Problem, Worsening (2), Review of Last Therapy Session (1), Review of Medication Regimen & Side Effects (2) and Review of New Medication or Change in Dosage (2)     Alexxander Kurt MD 01/17/2015, 5:07 PM

## 2015-01-17 NOTE — Plan of Care (Signed)
Problem: Diagnosis: Increased Risk For Suicide Attempt Goal: STG-Patient Will Comply With Medication Regime Outcome: Progressing Pt has been compliant with all scheduled medications this shift.    Problem: Alteration in mood & ability to function due to Goal: STG-Patient will attend groups Outcome: Progressing Pt attended evening group on 01/17/15.

## 2015-01-17 NOTE — Progress Notes (Signed)
Patient ID: [name], male   DOB: 1960-11-02, 54 y.o.   MRN: [id number] D: Patient states that he is having constant thoughts about suicide. Also reports voices telling him to harm himself.  Verbally contracts for safety. Rates depression, hopelessness and anxiety as 10 on a 1 to 10 scale. Affect flat and depressed.   A: Patient given emotional support from RN. Patient given medications per MD orders. Patient encouraged to attend groups and unit activities. Patient encouraged to come to staff with any questions or concerns.  R: Patient remains cooperative and appropriate on unit. Will continue to monitor patient for safety.

## 2015-01-17 NOTE — Progress Notes (Signed)
D: Pt has depressed affect and mood.  Pt reports he is "a little better."  Pt reports his goal was to "attend the groups, stop being myself up, being depressed and stuff."  Pt reports he attended groups today.  Pt reports SI, stating "it's a little better than it was yesterday."  Pt verbally contracts for safety with Probation officer.  Pt denies HI, denies hallucinations.  Pt has been visible in milieu interacting with peers and staff appropriately.  Pt attended evening group.   A: Introduced self to pt.  Met with pt 1:1 and provided support and encouragement.  Actively listened to pt.  Pt reported pain from a headache of 8/10.  PRN medication for pain offered to pt, pt refused.  Medications administered per order.   R: Pt is compliant with scheduled medications.  Pt verbally contracts for safety.  He reports he will notify staff of needs and concerns.  Will continue to monitor and assess.

## 2015-01-17 NOTE — [facility] Group Notes (Signed)
[facility] LCSW Aftercare Discharge Planning Group Note   01/17/2015 11:00 AM  Participation Quality:  Engaged  Mood/Affect:  Flat  Depression Rating:  9  Anxiety Rating:  9  Thoughts of Suicide:  Yes Will you contract for safety?   Yes  Current AVH:  No  Plan for Discharge/Comments:  "I feel jittery.  My blood pressure is low.  My depression and anxiety are really high.  States the d/c plan is still the same-return to [place] with mother and follow up [facility].  Appears to be settling in for extended stay.  Staying until payday?  Transportation Means: train to [facility]: mother  [place], [name]

## 2015-01-17 NOTE — Progress Notes (Signed)
Adult Psychoeducational Group Note  Date:  01/17/2015 Time:  9:31 PM  Group Topic/Focus:  Wrap-Up Group:   The focus of this group is to help patients review their daily goal of treatment and discuss progress on daily workbooks.  Participation Level:  Active  Participation Quality:  Appropriate  Affect:  Appropriate  Cognitive:  Appropriate  Insight: Appropriate  Engagement in Group:  Engaged  Modes of Intervention:  Discussion  Additional Comments: The patient expressed that attended group today.The patient also said that Recovery is about feeling better.  Octavio Manns 01/17/2015, 9:31 PM

## 2015-01-17 NOTE — [facility] Group Notes (Signed)
[facility] [employer] Group Therapy  01/17/2015 , 1:00 PM    Type of Therapy:  [employer] Presentation  Participation Level:  Active  Participation Quality:  Attentive  Affect:  Blunted  Cognitive:  Oriented  Insight:  Limited  Engagement in Therapy:  Engaged  Modes of Intervention:  Discussion, Education and Socialization  Summary of Progress/Problems:  Onalee Hua from [employer] came to present his recovery story and play the guitar.  Came briefly.  Left.  Did not return.  Daryel Gerald B 01/17/2015 , 1:00 PM

## 2015-01-18 ENCOUNTER — Inpatient Hospital Stay (HOSPITAL_COMMUNITY)
Admission: AD | Admit: 2015-01-18 | Discharge: 2015-01-18 | Disposition: A | Discharge status: Home / Self Care | Payer: Medicare Other | Source: Intra-hospital | Attending: Psychiatry | Admitting: Psychiatry

## 2015-01-18 DIAGNOSIS — E785 Hyperlipidemia, unspecified: Secondary | ICD-10-CM | POA: Clinically undetermined

## 2015-01-18 DIAGNOSIS — E221 Hyperprolactinemia: Secondary | ICD-10-CM | POA: Clinically undetermined

## 2015-01-18 LAB — GLUCOSE, CAPILLARY
GLUCOSE-CAPILLARY: 230 mg/dL — AB (ref 65–99)
Glucose-Capillary: 185 mg/dL — ABNORMAL HIGH (ref 65–99)
Glucose-Capillary: 109 mg/dL — ABNORMAL HIGH (ref 65–99)
Glucose-Capillary: 172 mg/dL — ABNORMAL HIGH (ref 65–99)

## 2015-01-18 LAB — HEMOGLOBIN A1C
Hgb A1c MFr Bld: 11.9 % — ABNORMAL HIGH (ref 4.8–5.6)
MEAN PLASMA GLUCOSE: 295 mg/dL

## 2015-01-18 LAB — PROLACTIN: PROLACTIN: 26.5 ng/mL — AB (ref 4.0–15.2)

## 2015-01-18 MED ORDER — HALOPERIDOL 5 MG PO TABS
15.0000 mg | ORAL_TABLET | Freq: Every day | ORAL | Status: DC
  Filled 2015-01-18: qty 3

## 2015-01-18 MED ORDER — HALOPERIDOL 5 MG PO TABS
5.0000 mg | ORAL_TABLET | Freq: Every day | ORAL | Status: DC
  Administered 2015-01-18: 5 mg via ORAL
  Filled 2015-01-18 (×2): qty 1

## 2015-01-18 MED ORDER — ARIPIPRAZOLE 5 MG PO TABS
5.0000 mg | ORAL_TABLET | Freq: Every evening | ORAL | Status: DC
  Administered 2015-01-18: 5 mg via ORAL
  Filled 2015-01-18 (×2): qty 1

## 2015-01-18 NOTE — Progress Notes (Signed)
D: Pt has depressed affect and mood.  Pt reports he feels "a little better."  Pt reports his goal today was "try to attend all the groups, I went out to the hospital, they checked my head, it was good."  Pt reports SI "on and off," denies plan, verbally contracts for safety.  Pt denies HI, denies hallucinations.  Pt has been visible in milieu interacting with peers and staff appropriately.  Pt attended part of evening group.   A:  Met with pt 1:1 and provided support and encouragement.  Actively listened to pt.  Medications administered per order.  PRN medication administered for pain. R: Pt is compliant with medications.  Pt verbally contracts for safety.  Will continue to monitor and assess.

## 2015-01-18 NOTE — [facility] Group Notes (Signed)
[facility] LCSW Group Therapy  01/18/2015 3:46 PM   Finding Balance in Life. Today's group focused on defining balance in one's own words, identifying things that can knock one off balance, and exploring healthy ways to maintain balance in life. Group members were asked to provide an example of a time when they felt off balance, describe how they handled that situation, and process healthier ways to regain balance in the future. Group members were asked to share the most important tool for maintaining balance that they learned while at [facility] and how they plan to apply this method after discharge.   Type of Therapy:  Group Therapy  Participation Level:  Minimal  Participation Quality:  Appropriate  Affect:  Depressed  Cognitive:  Appropriate  Insight:  Developing/Improving  Engagement in Therapy:  Limited  Modes of Intervention:  Exploration, Discussion  Summary of Progress/Problems:  Patient initially present for group, identifies "skateboard" as example of balance in life as it requires balance to ride.  Left group w RN shortly after it started, did not return.  Joseph Genera, LCSW Clinical Social Worker   [name] 01/18/2015, 3:46 PM

## 2015-01-18 NOTE — Progress Notes (Addendum)
[facility] MD Progress Note  01/18/2015 11:05 AM [name]  MRN:  [id number] Subjective: Patient states " I still have a headache , it just does not go away. I feel the AH is still on and off asking me to kill myself and saying that I am worthless.'   Objective: Patient seen and chart reviewed.Discussed patient with treatment team. [name] is a 54 y.o. AA male who is divorced , on SSD , presented to [facility] seeking support for depression and suicidal ideation. Patient's brother was killed a few weeks ago in a car accident which made him relapse on cocaine from which he was sober for atleast 2 years .   Pt today appears to be improved than the past days , his affect is more reactive . He however continues to endorse psychosis , command AH asking him to kill self. Pt reports sleep as improved .  Appetite continues to be reduced . Pt with SI on and off , has been able to cope with it more. Pt with elevated PL level - discussed starting another antipsychotic like abilify versus adding it to his current regimen. Pt continues to struggle with headaches - constant , aggravated by going outside - likely due to the hot weather per patient, relieved to some extent by pain medications - however it always comes back. Pt states he never had a headache in his life until this most recent one started a few weeks ago, initially treated for sinusitis - but the headaches persisted. Discussed CT scan head- pt agrees with plan. Pt encouraged to attend groups , take medications . Pt denies any ADRs of medications.       Principal Problem: Schizoaffective disorder, depressive type, multiple episodes , currently in acute episode Diagnosis:   Patient Active Problem List   Diagnosis Date Noted  . Headache [R51] 01/18/2015  . Hyperlipidemia [E78.5] 01/18/2015  . Hyperprolactinemia [E22.1] 01/18/2015  . Schizoaffective disorder, depressive type [F25.1] 01/16/2015  . Bereavement [Z63.4] 01/16/2015  . Cocaine  use disorder, severe, dependence [F14.20] 01/16/2015  . Cannabis use disorder, moderate, dependence [F12.20] 01/16/2015  . Suicidal ideation [R45.851] 11/09/2013  . Diabetes mellitus [E11.9] 03/11/2012  . HTN (hypertension) [I10] 03/11/2012   Total Time spent with patient: 30 minutes   Past Medical History:  Past Medical History  Diagnosis Date  . Diabetes mellitus   . Bipolar affective disorder   . Schizophrenia, schizo-affective   . Hypertension   . Diabetes mellitus 03/11/2012  . Bipolar disorder 03/11/2012  . Anxiety   . Depression   . 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 Finger surgery    . Wisdom tooth extraction     Family History:  Family History  Problem Relation Age of Onset  . Diabetes Mother   . Bipolar disorder Sister    Social History:  History  Alcohol Use No     History  Drug Use  . Yes  . Special: Marijuana, Cocaine    Comment: 4 days ago last THC and cocoaine use... relapsed after 2 yrs     History   Social History  . Marital Status: Single    Spouse Name: N/A  . Number of Children: N/A  . Years of Education: N/A   Social History Main Topics  . Smoking status: Never Smoker   . Smokeless tobacco: Never Used  . Alcohol Use: No  . Drug Use: Yes    Special:  Marijuana, Cocaine     Comment: 4 days ago last THC and cocoaine use... relapsed after 2 yrs   . Sexual Activity: Yes    Birth Control/ Protection: Condom     Comment: marijuana 25 days ago   Other Topics Concern  . None   Social History Narrative   Additional History:    Sleep: Fair  Appetite:  Poor     Musculoskeletal: Strength & Muscle Tone: within normal limits Gait & Station: normal Patient leans: N/A   Psychiatric Specialty Exam: Physical Exam  Review of Systems  Neurological: Positive for headaches.  Psychiatric/Behavioral: Positive for depression, suicidal ideas, hallucinations and substance abuse. The patient  is nervous/anxious.   All other systems reviewed and are negative.   Blood pressure 102/78, pulse 78, temperature 97.5 F (36.4 C), temperature source Oral, resp. rate 18, height 5' 7.58" (1.717 m), weight 97.297 kg (214 lb 8 oz).Body mass index is 33 kg/(m^2).  General Appearance: Fairly Groomed  Patent attorney::  Fair  Speech:  Clear and Coherent  Volume:  Normal  Mood:  Depressed  Affect:  Restricted reactive  Thought Process:  Coherent  Orientation:  Full (Time, Place, and Person)  Thought Content:  Hallucinations: Auditory command - asking him to kill self, says he is worthless  Suicidal Thoughts:  Yes.  without intent/plan able to cope with it better than before  Homicidal Thoughts:  No  Memory:  Immediate;   Fair Recent;   Fair Remote;   Fair  Judgement:  Impaired  Insight:  Shallow  Psychomotor Activity:  Decreased  Concentration:  Poor  Recall:  [employer] of Knowledge:Fair  Language: Fair  Akathisia:  No  Handed:  Right  AIMS (if indicated):     Assets:  Communication Skills Desire for Improvement  ADL's:  Intact  Cognition: WNL  Sleep:  Number of Hours: 6.5     Current Medications: Current Facility-Administered Medications  Medication Dose Route Frequency Provider Last Rate Last Dose  . acetaminophen (TYLENOL) tablet 650 mg  650 mg Oral Q4H PRN Jimmy Footman, MD      . alum & mag hydroxide-simeth (MAALOX/MYLANTA) 200-200-20 MG/5ML suspension 30 mL  30 mL Oral PRN Jimmy Footman, MD      . ARIPiprazole (ABILIFY) tablet 5 mg  5 mg Oral QPM Rosebud Koenen, MD      . benztropine (COGENTIN) tablet 0.5 mg  0.5 mg Oral QHS Jomarie Longs, MD   0.5 mg at 01/17/15 2101  . fluticasone (FLONASE) 50 MCG/ACT nasal spray 2 spray  2 spray Each Nare Daily Jomarie Longs, MD   2 spray at 01/18/15 0955  . glipiZIDE (GLUCOTROL) tablet 10 mg  10 mg Oral BID AC Jimmy Footman, MD   10 mg at 01/18/15 1610  . haloperidol (HALDOL) tablet 5 mg  5 mg Oral  QHS Haakon Titsworth, MD      . hydrocerin (EUCERIN) cream   Topical BID Jomarie Longs, MD      . ibuprofen (ADVIL,MOTRIN) tablet 400 mg  400 mg Oral Q8H PRN Jimmy Footman, MD   400 mg at 01/15/15 2051  . insulin aspart (novoLOG) injection 0-9 Units  0-9 Units Subcutaneous TID WC Jimmy Footman, MD   2 Units at 01/18/15 [phone]  . insulin detemir (LEVEMIR) injection 40 Units  40 Units Subcutaneous QHS Jimmy Footman, MD   40 Units at 01/17/15 2102  . lamoTRIgine (LAMICTAL) tablet 25 mg  25 mg Oral Daily Jomarie Longs, MD   25 mg  at 01/18/15 0956  . loratadine (CLARITIN) tablet 10 mg  10 mg Oral Daily Jomarie Longs, MD   10 mg at 01/18/15 0956  . metFORMIN (GLUCOPHAGE) tablet 1,000 mg  1,000 mg Oral BID WC Jimmy Footman, MD   1,000 mg at 01/18/15 0956  . naproxen (NAPROSYN) tablet 375 mg  375 mg Oral TID WC Jomarie Longs, MD   375 mg at 01/18/15 1610  . nicotine (NICODERM CQ - dosed in mg/24 hours) patch 21 mg  21 mg Transdermal Daily PRN Jimmy Footman, MD      . ondansetron [facility]) tablet 4 mg  4 mg Oral Q8H PRN Jimmy Footman, MD      . simvastatin (ZOCOR) tablet 10 mg  10 mg Oral q1800 Jomarie Longs, MD   10 mg at 01/17/15 2059  . traZODone (DESYREL) tablet 100 mg  100 mg Oral QHS Jomarie Longs, MD   100 mg at 01/17/15 2101    Lab Results:  Results for orders placed or performed during the hospital encounter of 01/15/15 (from the past 48 hour(s))  Glucose, capillary     Status: Abnormal   Collection Time: 01/16/15 11:37 AM  Result Value Ref Range   Glucose-Capillary 117 (H) 65 - 99 mg/dL   Comment 1 Notify RN    Comment 2 Document in Chart   Glucose, capillary     Status: Abnormal   Collection Time: 01/16/15  4:53 PM  Result Value Ref Range   Glucose-Capillary 150 (H) 65 - 99 mg/dL  Glucose, capillary     Status: Abnormal   Collection Time: 01/16/15  9:07 PM  Result Value Ref Range   Glucose-Capillary 171 (H) 65 -  99 mg/dL   Comment 1 Notify RN   Glucose, capillary     Status: Abnormal   Collection Time: 01/17/15  6:00 AM  Result Value Ref Range   Glucose-Capillary 153 (H) 65 - 99 mg/dL   Comment 1 Notify RN   TSH     Status: None   Collection Time: 01/17/15  6:25 AM  Result Value Ref Range   TSH 1.906 0.350 - 4.500 uIU/mL    Comment: Performed at [facility]  Lipid panel     Status: Abnormal   Collection Time: 01/17/15  6:25 AM  Result Value Ref Range   Cholesterol 242 (H) 0 - 200 mg/dL   Triglycerides 960 (H) <150 mg/dL   HDL 41 >45 mg/dL   Total CHOL/HDL Ratio 5.9 RATIO   VLDL 31 0 - 40 mg/dL   LDL Cholesterol 409 (H) 0 - 99 mg/dL    Comment:        Total Cholesterol/HDL:CHD Risk Coronary Heart Disease Risk Table                     Men   Women  1/2 Average Risk   3.4   3.3  Average Risk       5.0   4.4  2 X Average Risk   9.6   7.1  3 X Average Risk  23.4   11.0        Use the calculated Patient Ratio above and the CHD Risk Table to determine the patient's CHD Risk.        ATP III CLASSIFICATION (LDL):  <100     mg/dL   Optimal  811-914  mg/dL   Near or Above  Optimal  130-159  mg/dL   Borderline  161-096  mg/dL   High  >045     mg/dL   Very High Performed at [facility]   Hemoglobin A1c     Status: Abnormal   Collection Time: 01/17/15  6:25 AM  Result Value Ref Range   Hgb A1c MFr Bld 11.9 (H) 4.8 - 5.6 %    Comment: (NOTE)         Pre-diabetes: 5.7 - 6.4         Diabetes: >6.4         Glycemic control for adults with diabetes: <7.0    Mean Plasma Glucose 295 mg/dL    Comment: (NOTE) Performed At: [facility] [address] [place], [place] [id number] Mila Homer MD NW:[id number] Performed at [facility]   Prolactin     Status: Abnormal   Collection Time: 01/17/15  6:25 AM  Result Value Ref Range   Prolactin 26.5 (H) 4.0 - 15.2 ng/mL    Comment: (NOTE) Performed At: [facility] [address] [place], [place] [id number] Mila Homer MD [id number] Performed at [facility]   Glucose, capillary     Status: None   Collection Time: 01/17/15 11:56 AM  Result Value Ref Range   Glucose-Capillary 93 65 - 99 mg/dL   Comment 1 Notify RN   Glucose, capillary     Status: Abnormal   Collection Time: 01/17/15  5:08 PM  Result Value Ref Range   Glucose-Capillary 149 (H) 65 - 99 mg/dL   Comment 1 Document in Chart   Glucose, capillary     Status: Abnormal   Collection Time: 01/17/15  8:42 PM  Result Value Ref Range   Glucose-Capillary 111 (H) 65 - 99 mg/dL  Glucose, capillary     Status: Abnormal   Collection Time: 01/18/15  6:15 AM  Result Value Ref Range   Glucose-Capillary 185 (H) 65 - 99 mg/dL    Physical Findings: AIMS: Facial and Oral Movements Muscles of Facial Expression: None, normal Lips and Perioral Area: None, normal Jaw: None, normal Tongue: None, normal,Extremity Movements Upper (arms, wrists, hands, fingers): None, normal Lower (legs, knees, ankles, toes): None, normal, Trunk Movements Neck, shoulders, hips: None, normal, Overall Severity Severity of abnormal movements (highest score from questions above): None, normal Incapacitation due to abnormal movements: None, normal Patient's awareness of abnormal movements (rate only patient's report): No Awareness, Dental Status Current problems with teeth and/or dentures?: No Does patient usually wear dentures?: No  CIWA:    COWS:     Assessment: Patient continues to endorse depression , AH as well as SI , denies plan. Will continue treatment.   Treatment Plan Summary: Daily contact with patient to assess and evaluate symptoms and progress in treatment and Medication management  Will reduce Haldol for psychosis, mood sx- due to PL being elevated. Will add Abilify 5 mg po qpm and increase dose slowly. Will continue Cogentin 0.5 mg po qhs for EPS. Will continue  Lamictal 25 mg po daily for mood lability, depression. Will continue Trazodone to 100 mg po qhs prn for sleep. TSH reviewed- wnl.  Patient with suicidal ideation ,no plan  will be observed on the unit.   Continue Metformin 1000 mg po bid ,Levemir 40 units [place] qhs, Glipizide 10 mg po bid for DM.  Will order Hba1c->11 - Diabetic consult.  Added Zocor 10 mg po qpm for hyperlipidemia - Dietician consult.  Will continue  Naproxen 375 mg po tid for headaches. Will continue Tylenol prn for pain. Restarted Flonase- nasal spray - once daily . Will continue  Claritine 10 mg po daily for allergies. - CT scan head - persistent headaches  Will continue  substance abuse counseling.   Will continue to monitor vitals ,medication compliance and treatment side effects while patient is here.  Will monitor for medical issues as well as call consult as needed CSW will start working on disposition.  Patient to participate in therapeutic milieu .   Medical Decision Making:  Review of Psycho-Social Stressors (1), Review or order clinical lab tests (1), Review of Last Therapy Session (1), Review of Medication Regimen & Side Effects (2) and Review of New Medication or Change in Dosage (2)     Velia Pamer MD 01/18/2015, 11:05 AM

## 2015-01-18 NOTE — Clinical Social Work Note (Signed)
Referral faxed to [facility] for SA tx at patient request.  Santa Genera, LCSW Clinical Social Worker

## 2015-01-18 NOTE — Progress Notes (Signed)
NUTRITION NOTE  Nutrition Education Note  RD consulted for nutrition education regarding a dyslipidemia.   Lipid Panel     Component Value Date/Time   CHOL 242* 01/17/2015 0625   TRIG 156* 01/17/2015 0625   HDL 41 01/17/2015 0625   CHOLHDL 5.9 01/17/2015 0625   VLDL 31 01/17/2015 0625   LDLCALC 170* 01/17/2015 0625    RD provided "High Cholesterol Nutrition Therapy" handout from the Academy of Nutrition and Dietetics. Reviewed patient's dietary recall. Provided examples on ways to decrease fat intake in diet. Discouraged intake of processed foods. Encouraged fresh fruits and vegetables as well as whole grain sources of carbohydrates to maximize fiber intake. Teach back method used. Pt reports he has had this type of education in the past.  Expect good compliance.  Body mass index is 33 kg/(m^2). Pt meets criteria for obesity based on current BMI.  Current diet order is Carb Modified, patient is consuming meals and snacks as desired. Labs and medications reviewed. No further nutrition interventions warranted at this time. RD contact information provided. If additional nutrition issues arise, please re-consult RD.   Trenton Gammon, RD, LDN Inpatient Clinical Dietitian Pager # [phone] After hours/weekend pager # [phone]

## 2015-01-18 NOTE — [facility] Group Notes (Signed)
[facility] Group Notes:  (Nursing/MHT/Case Management/Adjunct)  Date:  01/18/2015  Time:  4:52 PM  Type of Therapy:  Nurse Education  Participation Level:  Did Not Attend  Participation Quality:  Did not attend  Affect:  Did not attend  Cognitive:  did not attend  Insight:  None  Engagement in Group:  None  Modes of Intervention:  did not attend   Summary of Progress/Problems: The purpose of this group is to discuss the topic of Goals. Patient was invited to group but did not attend.

## 2015-01-18 NOTE — Progress Notes (Addendum)
[name] is seen OOB UAL on the 400 hall today..he tolerates this well. He is quiet.  He completes his daily assessment and on it he writes he denies SI  And he rates his depression, hopelessness and anxiety " 8/8/8", respectively,  and he says he understands abilify 5 mg po qhs will start tonight per MD order.    A Because of his continued c/o  headache, for which he says, he gets no relief, he went to [facility] CT for head CT  today    ( result was WNL)  .    R Safety is in place and poc cont.

## 2015-01-18 NOTE — Progress Notes (Signed)
Inpatient Diabetes Program Recommendations  AACE/ADA: New Consensus Statement on Inpatient Glycemic Control (2013)  Target Ranges:  Prepandial:   less than 140 mg/dL      Peak postprandial:   less than 180 mg/dL (1-2 hours)      Critically ill patients:  140 - 180 mg/dL   Reason for Visit: Diabetes Consult  Diabetes history: DM2 Outpatient Diabetes medications: Levemir 40 units QHS, metformin 1000 mg bid, glipizide 10 mg bid, Novolog s/s Current orders for Inpatient glycemic control: same as above + Novolog sensitive s/s  Results for TAGEN, NIX (MRN [id number]) as of 01/18/2015 15:25  Ref. Range 01/17/2015 11:56 01/17/2015 17:08 01/17/2015 20:42 01/18/2015 06:15 01/18/2015 11:56  Glucose-Capillary Latest Ref Range: 65-99 mg/dL 93 992 (H) 426 (H) 834 (H) 109 (H)  Results for SOLLIE, TIPPERY (MRN [id number]) as of 01/18/2015 15:25  Ref. Range 01/17/2015 06:25  Hemoglobin A1C Latest Ref Range: 4.8-5.6 % 11.9 (H)    Inpatient Diabetes Program Recommendations Insulin - Basal: Increase Lantus if FBS > 180 mg/dL Correction (SSI): Increase Novolog to moderate tidwc Insulin - Meal Coverage: Add meal coverage insulin - Novolog 3 units tidwc HgbA1C: 11.9% - uncontrolled  Note: Will continue to follow. Thank you. Ailene Ards, RD, LDN, CDE Inpatient Diabetes Coordinator [phone]

## 2015-01-19 LAB — GLUCOSE, CAPILLARY
GLUCOSE-CAPILLARY: 210 mg/dL — AB (ref 65–99)
GLUCOSE-CAPILLARY: 160 mg/dL — AB (ref 65–99)
Glucose-Capillary: 220 mg/dL — ABNORMAL HIGH (ref 65–99)
Glucose-Capillary: 192 mg/dL — ABNORMAL HIGH (ref 65–99)

## 2015-01-19 MED ORDER — BENZTROPINE MESYLATE 0.5 MG PO TABS
0.5000 mg | ORAL_TABLET | Freq: Two times a day (BID) | ORAL | Status: DC
  Administered 2015-01-19 – 2015-01-23 (×8): 0.5 mg via ORAL
  Filled 2015-01-19 (×10): qty 1

## 2015-01-19 MED ORDER — ARIPIPRAZOLE 5 MG PO TABS
5.0000 mg | ORAL_TABLET | Freq: Two times a day (BID) | ORAL | Status: DC
  Administered 2015-01-19 – 2015-01-23 (×8): 5 mg via ORAL
  Filled 2015-01-19 (×10): qty 1

## 2015-01-19 MED ORDER — AMITRIPTYLINE HCL 25 MG PO TABS
25.0000 mg | ORAL_TABLET | Freq: Every day | ORAL | Status: DC
  Administered 2015-01-19 – 2015-01-22 (×4): 25 mg via ORAL
  Filled 2015-01-19 (×5): qty 1

## 2015-01-19 NOTE — Progress Notes (Signed)
Patient ID: [name], male   DOB: [birth year], 54 y.o.   MRN: [id number] PER STATE REGULATIONS 482.30  THIS CHART WAS REVIEWED FOR MEDICAL NECESSITY WITH RESPECT TO THE PATIENT'S ADMISSION/ DURATION OF STAY.  NEXT REVIEW DATE: 01/23/2015   Willa Rough, RN, BSN CASE MANAGER

## 2015-01-19 NOTE — Plan of Care (Signed)
Problem: Alteration in mood & ability to function due to Goal: LTG-Pt reports reduction in suicidal thoughts (Patient reports reduction in suicidal thoughts and is able to verbalize a safety plan for whenever patient is feeling suicidal)  Outcome: Not Progressing Pt continues to report SI "on and off."  He denies having a plan and he verbally contracts for safety.

## 2015-01-19 NOTE — Progress Notes (Signed)
D: Pt presents depressed in affect and mood this evening. Pt is visible within the milieu but with minimal interactions with others. Pt endorses SI. Pt verbally contracts for safety. Pt denies any HI/AVH.  A: Writer administered scheduled and prn medications to pt. Writer reiterated the d/c of his Haldol and the initiation of Elavil. Indications verbalized to pt. Continued support and availability as needed was extended to this pt. Staff continue to monitor pt with q75min checks.  R: No adverse drug reactions noted. Pt is compliant with his current POC. Pt receptive to treatment. Pt remains safe at this time.

## 2015-01-19 NOTE — Progress Notes (Signed)
[name] has spent most of his time...resting quietly in his bed. His status has not changed, ie he remains sad, depressed and keeps to himself. He completes his morning assessment and on it he writes he has ahd SI today but he contracts for safety and he rates his depression, hopelessness and anxiety " 05/06/09", respectively.   A He remains hopeful that he will be able to be accepted at [facility] in [place].    R Safety is in place.

## 2015-01-19 NOTE — Progress Notes (Signed)
[facility] MD Progress Note  01/19/2015 12:25 PM [name]  MRN:  [id number] Subjective: Patient states " I still do not feel all that great , I still have a headache and the CT came back negative. I am [facility] to get in to [facility].'  Objective: Patient seen and chart reviewed.Discussed patient with treatment team. [name] is a 54 y.o. AA male who is divorced , on SSD , presented to [facility] seeking support for depression and suicidal ideation. Patient's brother was killed a few weeks ago in a car accident which made him relapse on cocaine from which he was sober for atleast 2 years .   Pt today appears irritable , distressed that his CT scan head was negative and that he continues to have headaches. Pt also with SI , intermittent , reports he has been coping with it. Pt continues to endorse AH asking him to kill self , states it is an improvement that he no longer hear AH asking him to kill some one else.  Pt with elevated PL level - on haldol/abilify cross taper. Pt encouraged to attend groups , take medications . Pt denies any ADRs of medications.      Principal Problem: Schizoaffective disorder, depressive type, multiple episodes , currently in acute episode Diagnosis:   Patient Active Problem List   Diagnosis Date Noted  . Headache [R51] 01/18/2015  . Hyperlipidemia [E78.5] 01/18/2015  . Hyperprolactinemia [E22.1] 01/18/2015  . Schizoaffective disorder, depressive type [F25.1] 01/16/2015  . Bereavement [Z63.4] 01/16/2015  . Cocaine use disorder, severe, dependence [F14.20] 01/16/2015  . Cannabis use disorder, moderate, dependence [F12.20] 01/16/2015  . Suicidal ideation [R45.851] 11/09/2013  . Diabetes mellitus [E11.9] 03/11/2012  . HTN (hypertension) [I10] 03/11/2012   Total Time spent with patient: 30 minutes   Past Medical History:  Past Medical History  Diagnosis Date  . Diabetes mellitus   . Bipolar affective disorder   . Schizophrenia, schizo-affective   .  Hypertension   . Diabetes mellitus 03/11/2012  . Bipolar disorder 03/11/2012  . Anxiety   . Depression   . 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 Finger surgery    . Wisdom tooth extraction     Family History:  Family History  Problem Relation Age of Onset  . Diabetes Mother   . Bipolar disorder Sister    Social History:  History  Alcohol Use No     History  Drug Use  . Yes  . Special: Marijuana, Cocaine    Comment: 4 days ago last THC and cocoaine use... relapsed after 2 yrs     History   Social History  . Marital Status: Single    Spouse Name: N/A  . Number of Children: N/A  . Years of Education: N/A   Social History Main Topics  . Smoking status: Never Smoker   . Smokeless tobacco: Never Used  . Alcohol Use: No  . Drug Use: Yes    Special: Marijuana, Cocaine     Comment: 4 days ago last THC and cocoaine use... relapsed after 2 yrs   . Sexual Activity: Yes    Birth Control/ Protection: Condom     Comment: marijuana 25 days ago   Other Topics Concern  . None   Social History Narrative   Additional History:    Sleep: Fair  Appetite:  Poor     Musculoskeletal: Strength & Muscle Tone: within normal limits Gait & Station:  normal Patient leans: N/A   Psychiatric Specialty Exam: Physical Exam  Review of Systems  Neurological: Positive for headaches.  Psychiatric/Behavioral: Positive for depression, suicidal ideas, hallucinations and substance abuse. The patient is nervous/anxious.   All other systems reviewed and are negative.   Blood pressure 120/76, pulse 92, temperature 98 F (36.7 C), temperature source Oral, resp. rate 20, height 5' 7.58" (1.717 m), weight 97.297 kg (214 lb 8 oz).Body mass index is 33 kg/(m^2).  General Appearance: Fairly Groomed  Patent attorney::  Fair  Speech:  Clear and Coherent  Volume:  Normal  Mood:  Depressed some improvement  Affect:  Restricted reactive  Thought  Process:  Coherent  Orientation:  Full (Time, Place, and Person)  Thought Content:  Hallucinations: Auditory command - asking him to kill self, says he is worthless  Suicidal Thoughts:  Yes.  without intent/plan able to cope with it better than before  Homicidal Thoughts:  No  Memory:  Immediate;   Fair Recent;   Fair Remote;   Fair  Judgement:  Impaired  Insight:  Shallow  Psychomotor Activity:  Decreased  Concentration:  Poor  Recall:  [employer] of Knowledge:Fair  Language: Fair  Akathisia:  No  Handed:  Right  AIMS (if indicated):     Assets:  Communication Skills Desire for Improvement  ADL's:  Intact  Cognition: WNL  Sleep:  Number of Hours: 6.5     Current Medications: Current Facility-Administered Medications  Medication Dose Route Frequency Provider Last Rate Last Dose  . acetaminophen (TYLENOL) tablet 650 mg  650 mg Oral Q4H PRN Jimmy Footman, MD      . alum & mag hydroxide-simeth (MAALOX/MYLANTA) 200-200-20 MG/5ML suspension 30 mL  30 mL Oral PRN Jimmy Footman, MD      . amitriptyline (ELAVIL) tablet 25 mg  25 mg Oral QHS Ridley Dileo, MD      . ARIPiprazole (ABILIFY) tablet 5 mg  5 mg Oral BID Nicklas Mcsweeney, MD      . benztropine (COGENTIN) tablet 0.5 mg  0.5 mg Oral BID Jomarie Longs, MD      . fluticasone (FLONASE) 50 MCG/ACT nasal spray 2 spray  2 spray Each Nare Daily Jomarie Longs, MD   2 spray at 01/19/15 0918  . glipiZIDE (GLUCOTROL) tablet 10 mg  10 mg Oral BID AC Jimmy Footman, MD   10 mg at 01/19/15 6578  . hydrocerin (EUCERIN) cream   Topical BID Jomarie Longs, MD      . ibuprofen (ADVIL,MOTRIN) tablet 400 mg  400 mg Oral Q8H PRN Jimmy Footman, MD   400 mg at 01/18/15 2111  . insulin aspart (novoLOG) injection 0-9 Units  0-9 Units Subcutaneous TID WC Jimmy Footman, MD   2 Units at 01/19/15 1212  . insulin detemir (LEVEMIR) injection 40 Units  40 Units Subcutaneous QHS Jimmy Footman, MD   40 Units at 01/18/15 2112  . lamoTRIgine (LAMICTAL) tablet 25 mg  25 mg Oral Daily Jomarie Longs, MD   25 mg at 01/19/15 0919  . loratadine (CLARITIN) tablet 10 mg  10 mg Oral Daily Jomarie Longs, MD   10 mg at 01/19/15 0919  . metFORMIN (GLUCOPHAGE) tablet 1,000 mg  1,000 mg Oral BID WC Jimmy Footman, MD   1,000 mg at 01/19/15 0919  . naproxen (NAPROSYN) tablet 375 mg  375 mg Oral TID WC Jomarie Longs, MD   375 mg at 01/19/15 0919  . nicotine (NICODERM CQ - dosed in mg/24 hours) patch 21  mg  21 mg Transdermal Daily PRN Jimmy Footman, MD      . ondansetron [facility]) tablet 4 mg  4 mg Oral Q8H PRN Jimmy Footman, MD      . simvastatin (ZOCOR) tablet 10 mg  10 mg Oral q1800 Jomarie Longs, MD   10 mg at 01/18/15 1711    Lab Results:  Results for orders placed or performed during the hospital encounter of 01/15/15 (from the past 48 hour(s))  Glucose, capillary     Status: Abnormal   Collection Time: 01/17/15  5:08 PM  Result Value Ref Range   Glucose-Capillary 149 (H) 65 - 99 mg/dL   Comment 1 Document in Chart   Glucose, capillary     Status: Abnormal   Collection Time: 01/17/15  8:42 PM  Result Value Ref Range   Glucose-Capillary 111 (H) 65 - 99 mg/dL  Glucose, capillary     Status: Abnormal   Collection Time: 01/18/15  6:15 AM  Result Value Ref Range   Glucose-Capillary 185 (H) 65 - 99 mg/dL  Glucose, capillary     Status: Abnormal   Collection Time: 01/18/15 11:56 AM  Result Value Ref Range   Glucose-Capillary 109 (H) 65 - 99 mg/dL   Comment 1 Notify RN    Comment 2 Document in Chart   Glucose, capillary     Status: Abnormal   Collection Time: 01/18/15  5:07 PM  Result Value Ref Range   Glucose-Capillary 230 (H) 65 - 99 mg/dL  Glucose, capillary     Status: Abnormal   Collection Time: 01/18/15  9:59 PM  Result Value Ref Range   Glucose-Capillary 172 (H) 65 - 99 mg/dL   Comment 1 Notify RN   Glucose, capillary      Status: Abnormal   Collection Time: 01/19/15  6:04 AM  Result Value Ref Range   Glucose-Capillary 220 (H) 65 - 99 mg/dL  Glucose, capillary     Status: Abnormal   Collection Time: 01/19/15 11:54 AM  Result Value Ref Range   Glucose-Capillary 192 (H) 65 - 99 mg/dL    Physical Findings: AIMS: Facial and Oral Movements Muscles of Facial Expression: None, normal Lips and Perioral Area: None, normal Jaw: None, normal Tongue: None, normal,Extremity Movements Upper (arms, wrists, hands, fingers): None, normal Lower (legs, knees, ankles, toes): None, normal, Trunk Movements Neck, shoulders, hips: None, normal, Overall Severity Severity of abnormal movements (highest score from questions above): None, normal Incapacitation due to abnormal movements: None, normal Patient's awareness of abnormal movements (rate only patient's report): No Awareness, Dental Status Current problems with teeth and/or dentures?: No Does patient usually wear dentures?: No  CIWA:    COWS:     Assessment: Patient continues to endorse depression , AH as well as SI , denies plan. Will continue treatment.   Treatment Plan Summary: Daily contact with patient to assess and evaluate symptoms and progress in treatment and Medication management  Taper off Haldol .Will increase Abilify to 5 mg po bid for psychosis, mood sx. Will continue Cogentin 0.5 mg po bid for EPS. Will continue Lamictal 25 mg po daily for mood lability, depression. Will discontinue Trazodone for lack of efficacy . Add Elavil 25 mg po qhs for sleep as well as headaches. TSH reviewed- wnl.  Patient with suicidal ideation ,no plan  will be observed on the unit.   Continue Metformin 1000 mg po bid ,Levemir 40 units Henriette qhs, Glipizide 10 mg po bid for DM.  Will order Hba1c->11 - Diabetic  consult.  Added Zocor 10 mg po qpm for hyperlipidemia - Dietician consult.  Will continue Naproxen 375 mg po tid for headaches. Will continue Tylenol prn for  pain. Restarted Flonase- nasal spray - once daily . Will continue  Claritine 10 mg po daily for allergies. - CT scan head - persistent headaches- negative.  Will continue  substance abuse counseling.Referral to [facility] .   Will continue to monitor vitals ,medication compliance and treatment side effects while patient is here.  Will monitor for medical issues as well as call consult as needed CSW will start working on disposition.  Patient to participate in therapeutic milieu .   Medical Decision Making:  Review of Psycho-Social Stressors (1), Review or order clinical lab tests (1), Review of Last Therapy Session (1), Review of Medication Regimen & Side Effects (2) and Review of New Medication or Change in Dosage (2)     Kailany Dinunzio MD 01/19/2015, 12:25 PM

## 2015-01-19 NOTE — Tx Team (Signed)
Interdisciplinary Treatment Plan Update (Adult)  Date:  01/19/2015   Time Reviewed:  10:21 AM   Progress in Treatment: Attending groups: Yes. Participating in groups:  Yes. Taking medication as prescribed:  Yes. Tolerating medication:  Yes. Family/Significant othe contact made:  No Patient understands diagnosis:  Yes  As evidenced by seeking help with SI secondary to depression, and psychosis Discussing patient identified problems/goals with staff:  Yes, see initial care plan. Medical problems stabilized or resolved:  Yes, head CT negative, persistent headaches Denies suicidal/homicidal ideation: Yes. Issues/concerns per patient self-inventory:  No. Other:  New problem(s) identified:  Discharge Plan or Barriers:  [facility] if possible, otherwise will return to [facility] and follow up OP  Reason for Continuation of Hospitalization: Depression Hallucinations Medication stabilization  Comments:  Pt arrived to the ED with a complaint of suicidal thoughts. Pt states he has been on medication but it is not working. Pt states he is a patient at [facility] in [place]. Pt states it has been a month since he has seen them. Pt is complaining of a headache that has been present for 22 days. Pt states he has been having suicidal thoughts for the same period of time. Pt states he wants to shoot himself with a firearm but does not own a firearm.  6/24:  Patient continues to be irritable, depressed.  Wants referral for long term SA treatment.  CT scan negative, continues to complain of headaches.  MD assessing for additional needs.   Estimated length of stay:  4-5 days  New goal(s):  REferral to [facility] for long term treatment  Review of initial/current patient goals per problem list:  See care plans   Attendees: Patient:  01/19/2015 10:21 AM   Family:   01/19/2015 10:21 AM   Physician:  Jomarie Longs, MD 01/19/2015 10:21 AM   Nursing:   Harrell Lark, RN;Christa D, RN 01/19/2015  10:21 AM   CSW:  Santa Genera, LCSW   01/19/2015 10:21 AM   Other: Ronda Fairly, LCSW 01/19/2015 10:21 AM   Other:   01/19/2015 10:21 AM   Other:  Onnie Boer, Nurse CM 01/19/2015 10:21 AM   Other:  Leisa Lenz, [facility] 01/19/2015 10:21 AM   Other:  Tomasita Morrow, P4CC  01/19/2015 10:21 AM   Other:  01/19/2015 10:21 AM   Other:  01/19/2015 10:21 AM   Other:  01/19/2015 10:21 AM   Other:  01/19/2015 10:21 AM   Other:  01/19/2015 10:21 AM   Other:   01/19/2015 10:21 AM    Scribe for Treatment Team:   Santa Genera, 01/19/2015 10:21 AM

## 2015-01-19 NOTE — [facility] Group Notes (Signed)
[facility] LCSW Aftercare Discharge Planning Group Note   01/19/2015 9:30 AM  Participation Quality:  Appropriate  Mood/Affect:  Depressed and Flat  Depression Rating:  10  Anxiety Rating:  9  Thoughts of Suicide:  Yes Will you contract for safety?   Yes  Current AVH:  No  Plan for Discharge/Comments:  [facility]; phone interview at 10 am  Transportation Means:  [facility] takes responsibility; will need transport to either train or plane [place]  Supports: None stated  Harrill, Julious Payer

## 2015-01-19 NOTE — [facility] Group Notes (Signed)
[facility] LCSW Group Therapy  01/19/2015 1:15 PM  Type of Therapy:  Group Therapy  Participation Level:  Active  Participation Quality:  Attentive and Sharing  Affect:  Flat  Cognitive:  Appropriate  Insight:  Developing/Improving  Engagement in Therapy:  Developing/Improving  Modes of Intervention:  Discussion, Exploration, Socialization and Support  Summary of Progress/Problems: Group session facilitated by [facility] Chaplain on subject of courage and what that meant to patients and their experiences with courage. Patient shared that courage for him means being strong. He shared his experiences with inpatient and outpatient treatment and the importance of needed support/accountability of inpatient. "If I can't go inpatient, it'll take a lot of courage for me to be successful outpatient."  [name], Julious Payer

## 2015-01-20 DIAGNOSIS — F251 Schizoaffective disorder, depressive type: Principal | ICD-10-CM

## 2015-01-20 DIAGNOSIS — R45851 Suicidal ideations: Secondary | ICD-10-CM

## 2015-01-20 LAB — GLUCOSE, CAPILLARY
GLUCOSE-CAPILLARY: 230 mg/dL — AB (ref 65–99)
GLUCOSE-CAPILLARY: 324 mg/dL — AB (ref 65–99)
Glucose-Capillary: 179 mg/dL — ABNORMAL HIGH (ref 65–99)
Glucose-Capillary: 187 mg/dL — ABNORMAL HIGH (ref 65–99)

## 2015-01-20 MED ORDER — LAMOTRIGINE 25 MG PO TABS
50.0000 mg | ORAL_TABLET | Freq: Every day | ORAL | Status: DC
  Administered 2015-01-21 – 2015-01-23 (×3): 50 mg via ORAL
  Filled 2015-01-20 (×5): qty 2

## 2015-01-20 NOTE — Progress Notes (Signed)
Patient ID: [name], male   DOB: Dec 28, 1960, 54 y.o.   MRN: [id number]  D: Patient pleasant on approach tonight. Reports his mood is better and presently denies any SI/HI or a/v hallucinations. He says he is awaiting placement and hopes to go to [facility] [place]. No complaints tonight A: Staff will monitor on q 15 minute checks, follow treatment plan, and give meds as ordered R: Cooperative on unit and attended wrap up group

## 2015-01-20 NOTE — [facility] Group Notes (Signed)
[facility] Group Notes:  (Clinical Social Work)  01/20/2015  11:15-12:00PM  Summary of Progress/Problems:   The main focus of today's process group was to discuss healthy coping skills that can help people to stay out of the hospital, particularly focused on problems with adhering to medication recommendations.  Ideas were discussed for what can help overcome potential barriers mentioned and patients processed feelings.  The patient said he uses a bedtime routine to keep healthy, going to bed around 9pm then asleep by 10pm, sleeping until 6am.  He also talked about staying on his medications for the last year, going to his appointments as something new to him  Type of Therapy:  Group Therapy - Process  Participation Level:  Active  Participation Quality:  Attentive  Affect:  Blunted and Depressed  Cognitive:  Appropriate  Insight:  Developing/Improving  Engagement in Therapy:  Engaged  Modes of Intervention:  Exploration, Discussion  Ambrose Mantle, LCSW 01/20/2015, 12:41 PM

## 2015-01-20 NOTE — Progress Notes (Signed)
Patient ID: [name], male   DOB: 1961/02/02, 54 y.o.   MRN: [id number] [facility] MD Progress Note  01/20/2015 12:26 PM [name]  MRN:  [id number] Subjective: Patient states " I still do not feel that well.'  Objective: Patient seen and chart reviewed.Discussed patient with treatment team. [name] is a 54 y.o. AA male who is divorced , on SSD , presented to [facility] seeking support for depression and suicidal ideation. Patient's brother was killed a few weeks ago in a car accident which made him relapse on cocaine from which he was sober for atleast 2 years .   Pt today appears guarded, didn't talk much . Remains guarded and somewhat irritable , distressed that his CT scan head was negative and that he continues to have headaches. Pt also with SI , intermittent , reports he has been coping with it. Pt continues to endorse AH asking him to kill self , states it is an improvement that he no longer hear AH asking him to kill some one else.  Pt with elevated PL level - on haldol/abilify cross taper. Pt encouraged to attend groups , take medications . Pt denies any ADRs of medications.      Principal Problem: Schizoaffective disorder, depressive type, multiple episodes , currently in acute episode Diagnosis:   Patient Active Problem List   Diagnosis Date Noted  . Headache [R51] 01/18/2015  . Hyperlipidemia [E78.5] 01/18/2015  . Hyperprolactinemia [E22.1] 01/18/2015  . Schizoaffective disorder, depressive type [F25.1] 01/16/2015  . Bereavement [Z63.4] 01/16/2015  . Cocaine use disorder, severe, dependence [F14.20] 01/16/2015  . Cannabis use disorder, moderate, dependence [F12.20] 01/16/2015  . Suicidal ideation [R45.851] 11/09/2013  . Diabetes mellitus [E11.9] 03/11/2012  . HTN (hypertension) [I10] 03/11/2012   Total Time spent with patient: 30 minutes   Past Medical History:  Past Medical History  Diagnosis Date  . Diabetes mellitus   . Bipolar affective disorder   .  Schizophrenia, schizo-affective   . Hypertension   . Diabetes mellitus 03/11/2012  . Bipolar disorder 03/11/2012  . Anxiety   . Depression   . 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 Finger surgery    . Wisdom tooth extraction     Family History:  Family History  Problem Relation Age of Onset  . Diabetes Mother   . Bipolar disorder Sister    Social History:  History  Alcohol Use No     History  Drug Use  . Yes  . Special: Marijuana, Cocaine    Comment: 4 days ago last THC and cocoaine use... relapsed after 2 yrs     History   Social History  . Marital Status: Single    Spouse Name: N/A  . Number of Children: N/A  . Years of Education: N/A   Social History Main Topics  . Smoking status: Never Smoker   . Smokeless tobacco: Never Used  . Alcohol Use: No  . Drug Use: Yes    Special: Marijuana, Cocaine     Comment: 4 days ago last THC and cocoaine use... relapsed after 2 yrs   . Sexual Activity: Yes    Birth Control/ Protection: Condom     Comment: marijuana 25 days ago   Other Topics Concern  . None   Social History Narrative   Additional History:    Sleep: Fair  Appetite:  Poor     Musculoskeletal: Strength & Muscle Tone: within normal limits  Gait & Station: normal Patient leans: N/A   Psychiatric Specialty Exam: Physical Exam  Review of Systems  Respiratory: Negative for cough.   Cardiovascular: Negative for chest pain.  Neurological: Positive for headaches.  Psychiatric/Behavioral: Positive for depression, suicidal ideas, hallucinations and substance abuse. The patient is nervous/anxious.   All other systems reviewed and are negative.   Blood pressure 149/98, pulse 89, temperature 98.2 F (36.8 C), temperature source Oral, resp. rate 19, height 5' 7.58" (1.717 m), weight 97.297 kg (214 lb 8 oz).Body mass index is 33 kg/(m^2).  General Appearance: Fairly Groomed  Patent attorney::  Fair  Speech:   Clear and Coherent  Volume:  Normal  Mood:  Depressed some improvement  Affect:  Restricted reactive  Thought Process:  Coherent  Orientation:  Full (Time, Place, and Person)  Thought Content:  Hallucinations: Auditory and guarded  Suicidal Thoughts:  Yes.  without intent/plan able to cope with it better than before  Homicidal Thoughts:  No  Memory:  Immediate;   Fair Recent;   Fair Remote;   Fair  Judgement:  Impaired  Insight:  Shallow  Psychomotor Activity:  Decreased  Concentration:  Poor  Recall:  [employer] of Knowledge:Fair  Language: Fair  Akathisia:  No  Handed:  Right  AIMS (if indicated):     Assets:  Communication Skills Desire for Improvement  ADL's:  Intact  Cognition: WNL  Sleep:  Number of Hours: 6.5     Current Medications: Current Facility-Administered Medications  Medication Dose Route Frequency Provider Last Rate Last Dose  . acetaminophen (TYLENOL) tablet 650 mg  650 mg Oral Q4H PRN Jimmy Footman, MD      . alum & mag hydroxide-simeth (MAALOX/MYLANTA) 200-200-20 MG/5ML suspension 30 mL  30 mL Oral PRN Jimmy Footman, MD   30 mL at 01/20/15 0111  . amitriptyline (ELAVIL) tablet 25 mg  25 mg Oral QHS Jomarie Longs, MD   25 mg at 01/19/15 2122  . ARIPiprazole (ABILIFY) tablet 5 mg  5 mg Oral BID Jomarie Longs, MD   5 mg at 01/20/15 0907  . benztropine (COGENTIN) tablet 0.5 mg  0.5 mg Oral BID Jomarie Longs, MD   0.5 mg at 01/20/15 0907  . fluticasone (FLONASE) 50 MCG/ACT nasal spray 2 spray  2 spray Each Nare Daily Jomarie Longs, MD   2 spray at 01/20/15 0906  . glipiZIDE (GLUCOTROL) tablet 10 mg  10 mg Oral BID AC Jimmy Footman, MD   10 mg at 01/20/15 0645  . hydrocerin (EUCERIN) cream   Topical BID Jomarie Longs, MD      . ibuprofen (ADVIL,MOTRIN) tablet 400 mg  400 mg Oral Q8H PRN Jimmy Footman, MD   400 mg at 01/18/15 2111  . insulin aspart (novoLOG) injection 0-9 Units  0-9 Units Subcutaneous TID WC  Jimmy Footman, MD   3 Units at 01/20/15 1206  . insulin detemir (LEVEMIR) injection 40 Units  40 Units Subcutaneous QHS Jimmy Footman, MD   40 Units at 01/19/15 2122  . lamoTRIgine (LAMICTAL) tablet 25 mg  25 mg Oral Daily Jomarie Longs, MD   25 mg at 01/20/15 0907  . loratadine (CLARITIN) tablet 10 mg  10 mg Oral Daily Jomarie Longs, MD   10 mg at 01/20/15 0907  . metFORMIN (GLUCOPHAGE) tablet 1,000 mg  1,000 mg Oral BID WC Jimmy Footman, MD   1,000 mg at 01/20/15 0906  . naproxen (NAPROSYN) tablet 375 mg  375 mg Oral TID WC Jomarie Longs, MD  375 mg at 01/20/15 1206  . nicotine (NICODERM CQ - dosed in mg/24 hours) patch 21 mg  21 mg Transdermal Daily PRN Jimmy Footman, MD      . ondansetron [facility]) tablet 4 mg  4 mg Oral Q8H PRN Jimmy Footman, MD      . simvastatin (ZOCOR) tablet 10 mg  10 mg Oral q1800 Jomarie Longs, MD   10 mg at 01/19/15 1720    Lab Results:  Results for orders placed or performed during the hospital encounter of 01/15/15 (from the past 48 hour(s))  Glucose, capillary     Status: Abnormal   Collection Time: 01/18/15  5:07 PM  Result Value Ref Range   Glucose-Capillary 230 (H) 65 - 99 mg/dL  Glucose, capillary     Status: Abnormal   Collection Time: 01/18/15  9:59 PM  Result Value Ref Range   Glucose-Capillary 172 (H) 65 - 99 mg/dL   Comment 1 Notify RN   Glucose, capillary     Status: Abnormal   Collection Time: 01/19/15  6:04 AM  Result Value Ref Range   Glucose-Capillary 220 (H) 65 - 99 mg/dL  Glucose, capillary     Status: Abnormal   Collection Time: 01/19/15 11:54 AM  Result Value Ref Range   Glucose-Capillary 192 (H) 65 - 99 mg/dL  Glucose, capillary     Status: Abnormal   Collection Time: 01/19/15  5:16 PM  Result Value Ref Range   Glucose-Capillary 210 (H) 65 - 99 mg/dL   Comment 1 Notify RN    Comment 2 Document in Chart   Glucose, capillary     Status: Abnormal   Collection Time:  01/19/15  8:43 PM  Result Value Ref Range   Glucose-Capillary 160 (H) 65 - 99 mg/dL   Comment 1 Notify RN   Glucose, capillary     Status: Abnormal   Collection Time: 01/20/15  6:09 AM  Result Value Ref Range   Glucose-Capillary 179 (H) 65 - 99 mg/dL   Comment 1 Notify RN   Glucose, capillary     Status: Abnormal   Collection Time: 01/20/15 11:52 AM  Result Value Ref Range   Glucose-Capillary 230 (H) 65 - 99 mg/dL    Physical Findings: AIMS: Facial and Oral Movements Muscles of Facial Expression: None, normal Lips and Perioral Area: None, normal Jaw: None, normal Tongue: None, normal,Extremity Movements Upper (arms, wrists, hands, fingers): None, normal Lower (legs, knees, ankles, toes): None, normal, Trunk Movements Neck, shoulders, hips: None, normal, Overall Severity Severity of abnormal movements (highest score from questions above): None, normal Incapacitation due to abnormal movements: None, normal Patient's awareness of abnormal movements (rate only patient's report): No Awareness, Dental Status Current problems with teeth and/or dentures?: No Does patient usually wear dentures?: No  CIWA:    COWS:     Assessment: Patient continues to endorse depression , AH as well as SI , denies plan. Will continue treatment.   Treatment Plan Summary: Daily contact with patient to assess and evaluate symptoms and progress in treatment and Medication management  Taper off Haldol .Will increase Abilify to 5 mg po bid for psychosis, mood sx. Will continue Cogentin 0.5 mg po bid for EPS. Will increase Lamictal 50 mg po daily for mood lability, depression. Will discontinue Trazodone for lack of efficacy . Add Elavil 25 mg po qhs for sleep as well as headaches. TSH reviewed- wnl.  Patient with suicidal ideation ,no plan  will be observed on the unit.   Continue  Metformin 1000 mg po bid ,Levemir 40 units [place] qhs, Glipizide 10 mg po bid for DM.  Will order Hba1c->11 - Diabetic  consult.  Added Zocor 10 mg po qpm for hyperlipidemia - Dietician consult.  Will continue Naproxen 375 mg po tid for headaches. Will continue Tylenol prn for pain. Restarted Flonase- nasal spray - once daily . Will continue  Claritine 10 mg po daily for allergies. - CT scan head - persistent headaches- negative.  Will continue  substance abuse counseling.Referral to [facility] .   Will continue to monitor vitals ,medication compliance and treatment side effects while patient is here.  Will monitor for medical issues as well as call consult as needed CSW will start working on disposition.  Patient to participate in therapeutic milieu .   Medical Decision Making:  Review of Psycho-Social Stressors (1), Review or order clinical lab tests (1), Review of Last Therapy Session (1), Review of Medication Regimen & Side Effects (2) and Review of New Medication or Change in Dosage (2)     Mivaan Corbitt MD 01/20/2015, 12:26 PM

## 2015-01-20 NOTE — Progress Notes (Signed)
[name] has spent a lot of his day in the dayroom today...watching TV and talking with staff. HE remains flat, distant and depressed. HE takes all emds as scheduled and his cbg's are monitored per MD order.   A He shares with this nurse that he is planning on going to [facility] in [place]...hopefully he will find out on Monday if / when he has been accepted. HE states he is familiar with their rehab program, he understands what it is about and he is engaged in getting and maintaining his sobriety. He requested and was given prn mylanta at 1719, for c/o heartburn.   R Safety is in place and poc cont.

## 2015-01-20 NOTE — [facility] Group Notes (Signed)
[facility] Group Notes:  (Nursing/MHT/Case Management/Adjunct)  Date:  01/20/2015  Time:  1:56 PM  Type of Therapy:  Psychoeducational Skills  Participation Level:  Active  Participation Quality:  Appropriate  Affect:  Appropriate  Cognitive:  Appropriate  Insight:  Appropriate  Engagement in Group:  Engaged  Modes of Intervention:  Discussion  Summary of Progress/Problems: Pt did attend self inventory group, pt reported that he was negative SI/HI, no AH/VH noted. Pt rated his depression as a 6, and his helplessness/hopelessness as a 7.     Pt reported concerns about a painful bump to his back, pt advised that the doctor will be made aware.   Jacquelyne Balint Shanta 01/20/2015, 1:56 PM

## 2015-01-21 LAB — GLUCOSE, CAPILLARY
GLUCOSE-CAPILLARY: 145 mg/dL — AB (ref 65–99)
Glucose-Capillary: 194 mg/dL — ABNORMAL HIGH (ref 65–99)
Glucose-Capillary: 233 mg/dL — ABNORMAL HIGH (ref 65–99)
Glucose-Capillary: 146 mg/dL — ABNORMAL HIGH (ref 65–99)

## 2015-01-21 NOTE — Progress Notes (Signed)
D: Pt presents blunted in affect and depressed in mood. Pt's affect brightens upon interaction. Pt actively participates in unit activities. Pt is currently negative for any SI/HI/AVH. Pt was guarded in interaction with Clinical research associate. Pt is compliant with his current POC.  A: Writer administered scheduled medications to pt, per MD orders. Continued support and availability as needed was extended to this pt. Support specifically verbalized as pt awaits for placement at [facility] in [place]. Staff continue to monitor pt with q91min checks.  R: No adverse drug reactions noted. Pt receptive to treatment. Pt remains safe at this time.

## 2015-01-21 NOTE — Progress Notes (Signed)
Inpatient Diabetes Program Recommendations  AACE/ADA: New Consensus Statement on Inpatient Glycemic Control (2013)  Target Ranges:  Prepandial:   less than 140 mg/dL      Peak postprandial:   less than 180 mg/dL (1-2 hours)      Critically ill patients:  140 - 180 mg/dL   Consider increasing Novolog to moderate scale and adding Meal coverage 3 units TID per Glycemic Control order set. Thank you  Piedad Climes BSN, RN,CDE Inpatient Diabetes Coordinator [phone] (team pager)

## 2015-01-21 NOTE — Progress Notes (Signed)
Patient was seen for c/o "hard lump on back."  Examined patient and palpated raised circular area about size of a quarter.  There was no redness noted.  It was painful upon touch.  No visible open area.  Will continue to monitor.

## 2015-01-21 NOTE — Progress Notes (Signed)
[name] is OOB UAL on the 500 hall today...tolerated well. He is more relaxed. He is present at his groups. Compliant with his meds.   A He is encouraged to stay focused on his recovery and pursue his admission to [facility]. He is positive about his sobriety.He denies SI.   R Safet in place.

## 2015-01-21 NOTE — Progress Notes (Signed)
Patient ID: [name], male   DOB: September 18, 1960, 54 y.o.   MRN: [id number] [facility] MD Progress Note  01/21/2015 11:26 AM [name]  MRN:  [id number] Subjective: Patient states " feels better, tolerating meds"  Objective: Patient seen and chart reviewed.Discussed patient with treatment team. [name] is a 54 y.o. AA male who is divorced , on SSD , presented to [facility] seeking support for depression and suicidal ideation. Patient's brother was killed a few weeks ago in a car accident which made him relapse on cocaine from which he was sober for atleast 2 years .   Pt today appears less guarded, still didn't talk much . Remains guarded but not irritable.  Pt also with SI , intermittent , reports he has been coping with it. Pt did not endorse hallucinations today. Pt with elevated PL level - on haldol/abilify cross taper. Pt encouraged to attend groups , take medications . Pt denies any ADRs of medications.      Principal Problem: Schizoaffective disorder, depressive type, multiple episodes , currently in acute episode Diagnosis:   Patient Active Problem List   Diagnosis Date Noted  . Headache [R51] 01/18/2015  . Hyperlipidemia [E78.5] 01/18/2015  . Hyperprolactinemia [E22.1] 01/18/2015  . Schizoaffective disorder, depressive type [F25.1] 01/16/2015  . Bereavement [Z63.4] 01/16/2015  . Cocaine use disorder, severe, dependence [F14.20] 01/16/2015  . Cannabis use disorder, moderate, dependence [F12.20] 01/16/2015  . Suicidal ideation [R45.851] 11/09/2013  . Diabetes mellitus [E11.9] 03/11/2012  . HTN (hypertension) [I10] 03/11/2012   Total Time spent with patient: 30 minutes   Past Medical History:  Past Medical History  Diagnosis Date  . Diabetes mellitus   . Bipolar affective disorder   . Schizophrenia, schizo-affective   . Hypertension   . Diabetes mellitus 03/11/2012  . Bipolar disorder 03/11/2012  . Anxiety   . Depression   . Hepatitis 06/30/2013    Type C    Past  Surgical History  Procedure Laterality Date  . Finger surgery    . Wisdom tooth extraction     Family History:  Family History  Problem Relation Age of Onset  . Diabetes Mother   . Bipolar disorder Sister    Social History:  History  Alcohol Use No     History  Drug Use  . Yes  . Special: Marijuana, Cocaine    Comment: 4 days ago last THC and cocoaine use... relapsed after 2 yrs     History   Social History  . Marital Status: Single    Spouse Name: N/A  . Number of Children: N/A  . Years of Education: N/A   Social History Main Topics  . Smoking status: Never Smoker   . Smokeless tobacco: Never Used  . Alcohol Use: No  . Drug Use: Yes    Special: Marijuana, Cocaine     Comment: 4 days ago last THC and cocoaine use... relapsed after 2 yrs   . Sexual Activity: Yes    Birth Control/ Protection: Condom     Comment: marijuana 25 days ago   Other Topics Concern  . None   Social History Narrative   Additional History:    Sleep: Fair  Appetite:  Poor     Musculoskeletal: Strength & Muscle Tone: within normal limits Gait & Station: normal Patient leans: N/A   Psychiatric Specialty Exam: Physical Exam  Review of Systems  Constitutional: Negative for fever.  Respiratory: Negative for cough.   Cardiovascular: Negative for chest pain.  Psychiatric/Behavioral: Positive for depression,  hallucinations and substance abuse. The patient is nervous/anxious.   All other systems reviewed and are negative.   Blood pressure 130/86, pulse 85, temperature 97.9 F (36.6 C), temperature source Oral, resp. rate 20, height 5' 7.58" (1.717 m), weight 97.297 kg (214 lb 8 oz).Body mass index is 33 kg/(m^2).  General Appearance: Fairly Groomed  Patent attorney::  Fair  Speech:  Clear and Coherent  Volume:  Normal  Mood:  Depressed some improvement  Affect:  Restricted   Thought Process:  Coherent  Orientation:  Full (Time, Place, and Person)  Thought Content: guarded   Suicidal Thoughts:  Yes.  without intent/plan able to cope with it better than before  Homicidal Thoughts:  No  Memory:  Immediate;   Fair Recent;   Fair Remote;   Fair  Judgement:  Impaired  Insight:  Shallow  Psychomotor Activity:  Decreased  Concentration:  Poor  Recall:  [employer] of Knowledge:Fair  Language: Fair  Akathisia:  No  Handed:  Right  AIMS (if indicated):     Assets:  Communication Skills Desire for Improvement  ADL's:  Intact  Cognition: WNL  Sleep:  Number of Hours: 6.5     Current Medications: Current Facility-Administered Medications  Medication Dose Route Frequency Provider Last Rate Last Dose  . acetaminophen (TYLENOL) tablet 650 mg  650 mg Oral Q4H PRN Jimmy Footman, MD      . alum & mag hydroxide-simeth (MAALOX/MYLANTA) 200-200-20 MG/5ML suspension 30 mL  30 mL Oral PRN Jimmy Footman, MD   30 mL at 01/20/15 1719  . amitriptyline (ELAVIL) tablet 25 mg  25 mg Oral QHS Jomarie Longs, MD   25 mg at 01/20/15 2141  . ARIPiprazole (ABILIFY) tablet 5 mg  5 mg Oral BID Jomarie Longs, MD   5 mg at 01/21/15 0900  . benztropine (COGENTIN) tablet 0.5 mg  0.5 mg Oral BID Jomarie Longs, MD   0.5 mg at 01/21/15 1200  . fluticasone (FLONASE) 50 MCG/ACT nasal spray 2 spray  2 spray Each Nare Daily Jomarie Longs, MD   2 spray at 01/21/15 0900  . glipiZIDE (GLUCOTROL) tablet 10 mg  10 mg Oral BID AC Jimmy Footman, MD   10 mg at 01/21/15 0626  . hydrocerin (EUCERIN) cream   Topical BID Jomarie Longs, MD      . ibuprofen (ADVIL,MOTRIN) tablet 400 mg  400 mg Oral Q8H PRN Jimmy Footman, MD   400 mg at 01/18/15 2111  . insulin aspart (novoLOG) injection 0-9 Units  0-9 Units Subcutaneous TID WC Jimmy Footman, MD   1 Units at 01/21/15 [phone]  . insulin detemir (LEVEMIR) injection 40 Units  40 Units Subcutaneous QHS Jimmy Footman, MD   40 Units at 01/20/15 2141  . lamoTRIgine (LAMICTAL) tablet 50 mg  50 mg  Oral Daily Thresa Ross, MD   50 mg at 01/21/15 0901  . loratadine (CLARITIN) tablet 10 mg  10 mg Oral Daily Saramma Eappen, MD   10 mg at 01/21/15 0900  . metFORMIN (GLUCOPHAGE) tablet 1,000 mg  1,000 mg Oral BID WC Jimmy Footman, MD   1,000 mg at 01/21/15 0900  . naproxen (NAPROSYN) tablet 375 mg  375 mg Oral TID WC Jomarie Longs, MD   375 mg at 01/21/15 0626  . nicotine (NICODERM CQ - dosed in mg/24 hours) patch 21 mg  21 mg Transdermal Daily PRN Jimmy Footman, MD      . ondansetron [facility]) tablet 4 mg  4 mg Oral Q8H PRN Sue Lush  Hernandez-Gonzalez, MD      . simvastatin (ZOCOR) tablet 10 mg  10 mg Oral q1800 Jomarie Longs, MD   10 mg at 01/20/15 1800    Lab Results:  Results for orders placed or performed during the hospital encounter of 01/15/15 (from the past 48 hour(s))  Glucose, capillary     Status: Abnormal   Collection Time: 01/19/15 11:54 AM  Result Value Ref Range   Glucose-Capillary 192 (H) 65 - 99 mg/dL  Glucose, capillary     Status: Abnormal   Collection Time: 01/19/15  5:16 PM  Result Value Ref Range   Glucose-Capillary 210 (H) 65 - 99 mg/dL   Comment 1 Notify RN    Comment 2 Document in Chart   Glucose, capillary     Status: Abnormal   Collection Time: 01/19/15  8:43 PM  Result Value Ref Range   Glucose-Capillary 160 (H) 65 - 99 mg/dL   Comment 1 Notify RN   Glucose, capillary     Status: Abnormal   Collection Time: 01/20/15  6:09 AM  Result Value Ref Range   Glucose-Capillary 179 (H) 65 - 99 mg/dL   Comment 1 Notify RN   Glucose, capillary     Status: Abnormal   Collection Time: 01/20/15 11:52 AM  Result Value Ref Range   Glucose-Capillary 230 (H) 65 - 99 mg/dL  Glucose, capillary     Status: Abnormal   Collection Time: 01/20/15  4:53 PM  Result Value Ref Range   Glucose-Capillary 324 (H) 65 - 99 mg/dL  Glucose, capillary     Status: Abnormal   Collection Time: 01/20/15  8:42 PM  Result Value Ref Range   Glucose-Capillary 187 (H)  65 - 99 mg/dL   Comment 1 Notify RN   Glucose, capillary     Status: Abnormal   Collection Time: 01/21/15  6:02 AM  Result Value Ref Range   Glucose-Capillary 145 (H) 65 - 99 mg/dL   Comment 1 Notify RN     Physical Findings: AIMS: Facial and Oral Movements Muscles of Facial Expression: None, normal Lips and Perioral Area: None, normal Jaw: None, normal Tongue: None, normal,Extremity Movements Upper (arms, wrists, hands, fingers): None, normal Lower (legs, knees, ankles, toes): None, normal, Trunk Movements Neck, shoulders, hips: None, normal, Overall Severity Severity of abnormal movements (highest score from questions above): None, normal Incapacitation due to abnormal movements: None, normal Patient's awareness of abnormal movements (rate only patient's report): No Awareness, Dental Status Current problems with teeth and/or dentures?: No Does patient usually wear dentures?: No  CIWA:    COWS:     Assessment: Patient continues to endorse depression , AH as well as SI , denies plan. Will continue treatment.   Treatment Plan Summary: Daily contact with patient to assess and evaluate symptoms and progress in treatment and Medication management   Abilify to 5 mg po bid for psychosis, mood sx. Was increased yesterday Will continue Cogentin 0.5 mg po bid for EPS.  Lamictal 50 mg po daily for mood lability, depression. Was increased yesterday with some improvement in mood Elavil 25 mg po qhs for sleep as well as headaches. TSH reviewed- wnl.  Patient with suicidal ideation ,no plan  will be observed on the unit.   Continue Metformin 1000 mg po bid ,Levemir 40 units [place] qhs, Glipizide 10 mg po bid for DM.  Will order Hba1c->11 - Diabetic consult.   Zocor 10 mg po qpm for hyperlipidemia - Dietician consult.  Will continue Naproxen 375  mg po tid for headaches. Will continue Tylenol prn for pain. Restarted Flonase- nasal spray - once daily . Will continue  Claritine 10 mg po  daily for allergies. - CT scan head - persistent headaches- negative.  Will continue  substance abuse counseling.Referral to [facility] .   Will continue to monitor vitals ,medication compliance and treatment side effects while patient is here.  Will monitor for medical issues as well as call consult as needed CSW will start working on disposition.  Patient to participate in therapeutic milieu .   Medical Decision Making:  Review of Psycho-Social Stressors (1), Review or order clinical lab tests (1), Review of Last Therapy Session (1), Review of Medication Regimen & Side Effects (2) and Review of New Medication or Change in Dosage (2)     Nayshawn Mesta MD 01/21/2015, 11:26 AM

## 2015-01-21 NOTE — [facility] Group Notes (Signed)
[facility] Group Notes:  (Clinical Social Work)  01/21/2015  [facility] Group Notes:  (Clinical Social Work)  01/21/2015  11:00AM-12:00PM  Summary of Progress/Problems:  The main focus of today's process group was to listen to a variety of genres of music and to identify that different types of music provoke different responses.  The patient then was able to identify personally what was soothing for them, as well as energizing.  Handouts were used to record feelings evoked, as well as how patient can personally use this knowledge in sleep habits, with depression, and with other symptoms.  The patient expressed understanding of concepts, as well as knowledge of how each type of music affected him/her and how this can be used at home as a wellness/recovery tool.  He requested music, was able to talk about how piece of music made him feel, talked about how his sponsor has told him to change the music he listens to in order to change his mindset.  He stated he has gotten away from his Higher Power and particularly enjoyed the [employer], can use that to regain that bond with his Higher Power.  Type of Therapy:  Music Therapy   Participation Level:  Active  Participation Quality:  Attentive and Sharing  Affect:  Blunted  Cognitive:  Oriented  Insight:  Engaged  Engagement in Therapy:  Engaged  Modes of Intervention:   Activity, Exploration  Ambrose Mantle, LCSW 01/21/2015

## 2015-01-22 LAB — GLUCOSE, CAPILLARY
GLUCOSE-CAPILLARY: 141 mg/dL — AB (ref 65–99)
GLUCOSE-CAPILLARY: 165 mg/dL — AB (ref 65–99)
Glucose-Capillary: 122 mg/dL — ABNORMAL HIGH (ref 65–99)
Glucose-Capillary: 156 mg/dL — ABNORMAL HIGH (ref 65–99)

## 2015-01-22 MED ORDER — INSULIN ASPART 100 UNIT/ML ~~LOC~~ SOLN
4.0000 [IU] | Freq: Three times a day (TID) | SUBCUTANEOUS | Status: DC
  Administered 2015-01-22 – 2015-01-23 (×2): 4 [IU] via SUBCUTANEOUS

## 2015-01-22 MED ORDER — INSULIN ASPART 100 UNIT/ML ~~LOC~~ SOLN
0.0000 [IU] | Freq: Three times a day (TID) | SUBCUTANEOUS | Status: DC
  Administered 2015-01-22 – 2015-01-23 (×2): 2 [IU] via SUBCUTANEOUS

## 2015-01-22 MED ORDER — DIPHENHYDRAMINE-ZINC ACETATE 2-0.1 % EX CREA
TOPICAL_CREAM | Freq: Three times a day (TID) | CUTANEOUS | Status: DC
  Administered 2015-01-22 – 2015-01-23 (×2): via TOPICAL
  Filled 2015-01-22 (×2): qty 28

## 2015-01-22 NOTE — Progress Notes (Signed)
D   Pt is visible on the milieu but his interactions with others is limited   He appears depressed and sad   His behavior is appropriate  He attends groups and is compliant with his medications and did not require additional medications A   Verbal  support given    Medications administered and effectiveness monitored   Q 15 min checks  Discussed diabetes and insulin administration with pt  Pt requested to administer own insulin R  Pt administered insulin effectively and is presently safe

## 2015-01-22 NOTE — Progress Notes (Signed)
D:  Patient's self inventory sheet, patient sleeps good, no sleep medication given.  Fair appetite, normal energy level, good concentration.  Rated depression, hopeless and anxiety #5.  Denied withdrawals.  Denied SI, which is occasionally off/on SI feelings.  Physical problems, right upper shoulder, pain medication does not help, but is "getting better".  Plans to go to group, and will talk to MD.  Does have discharge plans about problems with discharge. A:  Medications administered per MD orders.  Emotional support and encouragement given patient. R:  Denied SI and HI while talking to nurse this morning.  Denied A/V hallucinations.  Right upper shoulder has reddish area, size of quarter, which itches at times.  Stated he had a small area removed in the past which bothers him at times.

## 2015-01-22 NOTE — [facility] Group Notes (Signed)
[facility]   01/22/2015 8:45 AM  Participation Quality:  Alert, Appropriate and Oriented  Mood/Affect:  Calm  Depression Rating:  0  Anxiety Rating:  0  Thoughts of Suicide:  Pt denies SI/HI  Will you contract for safety?   Yes  Current AVH:  Pt denies  Plan for Discharge/Comments:  Pt attended discharge planning group and actively participated in group.  CSW provided pt with today's workbook.  Pt reports feeling stable to discharge.  Pt states that he has been calling [facility] himself daily and will continue to do that after discharge until they have a bed available; pt states that he's done this before and gone there so he is familiar with the process.  Pt plans to stay with his mom in [place] and [facility] and therapy until getting into [facility].  No further needs voiced by pt at this time.    Transportation Means: Pt reports access to transportation  Supports: No supports mentioned at this time  Reyes IvanChelsea Horton, LCSW 01/22/2015 10:59 AM

## 2015-01-22 NOTE — Plan of Care (Signed)
Problem: Consults Goal: Suicide Risk Patient Education (See Patient Education module for education specifics)  Outcome: Completed/Met Date Met:  01/22/15 Nurse discussed SI thoughts/ depression/coping skills with patient.        

## 2015-01-22 NOTE — Tx Team (Signed)
Interdisciplinary Treatment Plan Update (Adult)  Date: 01/22/2015  Time Reviewed:  9:45 AM  Progress in Treatment: Attending groups: Yes Participating in groups:  Yes Taking medication as prescribed:  Yes Tolerating medication:  Yes Family/Significant othe contact made: No, pt refused Patient understands diagnosis:  Yes Discussing patient identified problems/goals with staff:  Yes Medical problems stabilized or resolved:  Yes Denies suicidal/homicidal ideation: Yes Issues/concerns per patient self-inventory:  Yes Other:  New problem(s) identified: N/A  Discharge Plan or Barriers: Pt will follow up at [facility] in [place] for outpt care and return home.  He will continue to [facility] with [facility].    Reason for Continuation of Hospitalization: Anxiety Depression Medication Stabilization  Comments: N/A  Estimated length of stay: 1 day  For review of initial/current patient goals, please see plan of care.  Attendees: Patient:     Family:     Physician:  Dr. Elna Breslow 01/22/2015 11:45 AM   Nursing:   Lendell Caprice, RN 01/22/2015 11:45 AM   Clinical Social Worker:  [name], LCSW 01/22/2015 11:45 AM   Other: Quintella Reichert, RN 01/22/2015 11:45 AM   Other:  Dorothyann Peng, LCSW   Other:     Other:     Other:    Other:    Other:    Other:    Other:    Other:     Scribe for Treatment Team:   Carmina Miller, 01/22/2015 11:45 AM

## 2015-01-22 NOTE — Clinical Social Work Note (Signed)
CSW spoke with [name] at [facility] who verified that they did receive the referral and pt is on the wait list.  [name] states that pt has indeed been calling daily to check on bed availability and CSW states pt is to continue doing so after discharge.    [name], LCSW 01/22/2015  1:27 PM

## 2015-01-22 NOTE — Progress Notes (Signed)
Adult Psychoeducational Group Note  Date:  01/22/2015 Time:  2:25 PM  Group Topic/Focus:  Goals Group:   The focus of this group is to help patients establish daily goals to achieve during treatment and discuss how the patient can incorporate goal setting into their daily lives to aide in recovery.  Participation Level:  Active  Participation Quality:  Appropriate and Attentive  Affect:  Appropriate  Cognitive:  Appropriate  Insight: Appropriate  Engagement in Group:  Engaged  Modes of Intervention:  Discussion  Additional Comments:  Pt shared that SI and depression are the reasons why he is here at [facility]. Pt talked a lot about his goals of staying clean when he is discharged. Pt described that he has been in treatment 5 times for cocaine and marijuana use. Pt shared that he wants to stay positive and not hang around people who are a negative influence on him. Pt's goal today is to attend all groups and to get something out of talking to different people about their experiences.   Fara Olden O 01/22/2015, 2:25 PM

## 2015-01-22 NOTE — [facility] Suicide Risk Assessment (Signed)
[facility] Family/Significant Other Suicide Prevention Education  Suicide Prevention Education:   Patient Refusal for Family/Significant Other Suicide Prevention Education: The patient has refused to provide written consent for family/significant other to be provided Family/Significant Other Suicide Prevention Education during admission and/or prior to discharge.  Physician notified.  CSW provided suicide prevention information with patient.    The suicide prevention education provided includes the following:  Suicide risk factors  Suicide prevention and interventions  National Suicide Hotline telephone number  [facility] assessment telephone number  [facility] Emergency Assistance 911  [facility] and/or [facility] telephone number   Reyes IvanChelsea Horton, [place] 01/22/2015 12:22 PM

## 2015-01-22 NOTE — Progress Notes (Signed)
Patient ID: [name], male   DOB: 1961-07-09, 54 y.o.   MRN: [id number] [facility] MD Progress Note  01/22/2015 3:28 PM [name]  MRN:  [id number] Subjective: Patient states " I am all right.'  Objective: Patient seen and chart reviewed.Discussed patient with treatment team. [name] is a 54 y.o. AA male who is divorced , on SSD , presented to [facility] seeking support for depression and suicidal ideation. Patient's brother was killed a few weeks ago in a car accident which made him relapse on cocaine from which he was sober for atleast 2 years .   Pt today appears less guarded, less anxious . Pt today is more focussed on a rash that he has on his right shoulder- pt reports itching - had a surgery at that site in the past. Pt otherwise denies any new concerns - has been tolerating his medications well. Pt encouraged to attend groups , take medications . Pt denies any ADRs of medications.      Principal Problem: Schizoaffective disorder, depressive type, multiple episodes , currently in acute episode Diagnosis:   Patient Active Problem List   Diagnosis Date Noted  . Headache [R51] 01/18/2015  . Hyperlipidemia [E78.5] 01/18/2015  . Hyperprolactinemia [E22.1] 01/18/2015  . Schizoaffective disorder, depressive type [F25.1] 01/16/2015  . Bereavement [Z63.4] 01/16/2015  . Cocaine use disorder, severe, dependence [F14.20] 01/16/2015  . Cannabis use disorder, moderate, dependence [F12.20] 01/16/2015  . Suicidal ideation [R45.851] 11/09/2013  . Diabetes mellitus [E11.9] 03/11/2012  . HTN (hypertension) [I10] 03/11/2012   Total Time spent with patient: 30 minutes   Past Medical History:  Past Medical History  Diagnosis Date  . Diabetes mellitus   . Bipolar affective disorder   . Schizophrenia, schizo-affective   . Hypertension   . Diabetes mellitus 03/11/2012  . Bipolar disorder 03/11/2012  . Anxiety   . Depression   . 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 Finger surgery    . Wisdom tooth extraction     Family History:  Family History  Problem Relation Age of Onset  . Diabetes Mother   . Bipolar disorder Sister    Social History:  History  Alcohol Use No     History  Drug Use  . Yes  . Special: Marijuana, Cocaine    Comment: 4 days ago last THC and cocoaine use... relapsed after 2 yrs     History   Social History  . Marital Status: Single    Spouse Name: N/A  . Number of Children: N/A  . Years of Education: N/A   Social History Main Topics  . Smoking status: Never Smoker   . Smokeless tobacco: Never Used  . Alcohol Use: No  . Drug Use: Yes    Special: Marijuana, Cocaine     Comment: 4 days ago last THC and cocoaine use... relapsed after 2 yrs   . Sexual Activity: Yes    Birth Control/ Protection: Condom     Comment: marijuana 25 days ago   Other Topics Concern  . None   Social History Narrative   Additional History:    Sleep: Fair  Appetite:  Poor     Musculoskeletal: Strength & Muscle Tone: within normal limits Gait & Station: normal Patient leans: N/A   Psychiatric Specialty Exam: Physical Exam  Review of Systems  Constitutional: Negative for fever.  Respiratory: Negative for cough.   Cardiovascular: Negative for chest pain.  Skin:  Positive for rash.  Psychiatric/Behavioral: Positive for depression and substance abuse. Negative for hallucinations. The patient is nervous/anxious.   All other systems reviewed and are negative.   Blood pressure 134/89, pulse 82, temperature 97.4 F (36.3 C), temperature source Oral, resp. rate 20, height 5' 7.58" (1.717 m), weight 97.297 kg (214 lb 8 oz).Body mass index is 33 kg/(m^2).  General Appearance: Fairly Groomed  Patent attorney::  Fair  Speech:  Clear and Coherent  Volume:  Normal  Mood:  Depressed some improvement  Affect:  Congruent   Thought Process:  Coherent  Orientation:  Full (Time, Place, and Person)   Thought Content: guarded  Suicidal Thoughts:  No   Homicidal Thoughts:  No  Memory:  Immediate;   Fair Recent;   Fair Remote;   Fair  Judgement:  Impaired  Insight:  Shallow  Psychomotor Activity:  Normal  Concentration:  Fair  Recall:  [employer] of Knowledge:Fair  Language: Fair  Akathisia:  No  Handed:  Right  AIMS (if indicated):     Assets:  Communication Skills Desire for Improvement  ADL's:  Intact  Cognition: WNL  Sleep:  Number of Hours: 6.5     Current Medications: Current Facility-Administered Medications  Medication Dose Route Frequency Provider Last Rate Last Dose  . acetaminophen (TYLENOL) tablet 650 mg  650 mg Oral Q4H PRN Jimmy Footman, MD      . alum & mag hydroxide-simeth (MAALOX/MYLANTA) 200-200-20 MG/5ML suspension 30 mL  30 mL Oral PRN Jimmy Footman, MD   30 mL at 01/20/15 1719  . amitriptyline (ELAVIL) tablet 25 mg  25 mg Oral QHS Jomarie Longs, MD   25 mg at 01/21/15 2119  . ARIPiprazole (ABILIFY) tablet 5 mg  5 mg Oral BID Jomarie Longs, MD   5 mg at 01/22/15 0755  . benztropine (COGENTIN) tablet 0.5 mg  0.5 mg Oral BID Jomarie Longs, MD   0.5 mg at 01/22/15 0755  . fluticasone (FLONASE) 50 MCG/ACT nasal spray 2 spray  2 spray Each Nare Daily Jomarie Longs, MD   2 spray at 01/22/15 0754  . glipiZIDE (GLUCOTROL) tablet 10 mg  10 mg Oral BID AC Jimmy Footman, MD   10 mg at 01/22/15 1610  . hydrocerin (EUCERIN) cream   Topical BID Jomarie Longs, MD      . ibuprofen (ADVIL,MOTRIN) tablet 400 mg  400 mg Oral Q8H PRN Jimmy Footman, MD   400 mg at 01/18/15 2111  . insulin aspart (novoLOG) injection 0-15 Units  0-15 Units Subcutaneous TID WC Raynold Blankenbaker, MD      . insulin aspart (novoLOG) injection 4 Units  4 Units Subcutaneous TID WC Compton Brigance, MD      . insulin detemir (LEVEMIR) injection 40 Units  40 Units Subcutaneous QHS Jimmy Footman, MD   40 Units at 01/21/15 2118  . lamoTRIgine  (LAMICTAL) tablet 50 mg  50 mg Oral Daily Thresa Ross, MD   50 mg at 01/22/15 0755  . loratadine (CLARITIN) tablet 10 mg  10 mg Oral Daily Jomarie Longs, MD   10 mg at 01/22/15 0755  . metFORMIN (GLUCOPHAGE) tablet 1,000 mg  1,000 mg Oral BID WC Jimmy Footman, MD   1,000 mg at 01/22/15 0755  . naproxen (NAPROSYN) tablet 375 mg  375 mg Oral TID WC Jomarie Longs, MD   375 mg at 01/22/15 1156  . nicotine (NICODERM CQ - dosed in mg/24 hours) patch 21 mg  21 mg Transdermal Daily PRN Jimmy Footman, MD      .  ondansetron (ZOFRAN) tablet 4 mg  4 mg Oral Q8H PRN Jimmy Footman, MD      . simvastatin (ZOCOR) tablet 10 mg  10 mg Oral q1800 Jomarie Longs, MD   10 mg at 01/21/15 1752    Lab Results:  Results for orders placed or performed during the hospital encounter of 01/15/15 (from the past 48 hour(s))  Glucose, capillary     Status: Abnormal   Collection Time: 01/20/15  4:53 PM  Result Value Ref Range   Glucose-Capillary 324 (H) 65 - 99 mg/dL  Glucose, capillary     Status: Abnormal   Collection Time: 01/20/15  8:42 PM  Result Value Ref Range   Glucose-Capillary 187 (H) 65 - 99 mg/dL   Comment 1 Notify RN   Glucose, capillary     Status: Abnormal   Collection Time: 01/21/15  6:02 AM  Result Value Ref Range   Glucose-Capillary 145 (H) 65 - 99 mg/dL   Comment 1 Notify RN   Glucose, capillary     Status: Abnormal   Collection Time: 01/21/15 11:59 AM  Result Value Ref Range   Glucose-Capillary 194 (H) 65 - 99 mg/dL  Glucose, capillary     Status: Abnormal   Collection Time: 01/21/15  5:04 PM  Result Value Ref Range   Glucose-Capillary 233 (H) 65 - 99 mg/dL  Glucose, capillary     Status: Abnormal   Collection Time: 01/21/15  9:08 PM  Result Value Ref Range   Glucose-Capillary 146 (H) 65 - 99 mg/dL   Comment 1 Notify RN   Glucose, capillary     Status: Abnormal   Collection Time: 01/22/15  6:09 AM  Result Value Ref Range   Glucose-Capillary 141 (H)  65 - 99 mg/dL   Comment 1 Notify RN   Glucose, capillary     Status: Abnormal   Collection Time: 01/22/15 11:52 AM  Result Value Ref Range   Glucose-Capillary 165 (H) 65 - 99 mg/dL   Comment 1 Notify RN    Comment 2 Document in Chart     Physical Findings: AIMS: Facial and Oral Movements Muscles of Facial Expression: None, normal Lips and Perioral Area: None, normal Jaw: None, normal Tongue: None, normal,Extremity Movements Upper (arms, wrists, hands, fingers): None, normal Lower (legs, knees, ankles, toes): None, normal, Trunk Movements Neck, shoulders, hips: None, normal, Overall Severity Severity of abnormal movements (highest score from questions above): None, normal Incapacitation due to abnormal movements: None, normal Patient's awareness of abnormal movements (rate only patient's report): No Awareness, Dental Status Current problems with teeth and/or dentures?: No Does patient usually wear dentures?: No  CIWA:  CIWA-Ar Total: 1 COWS:  COWS Total Score: 2  Assessment: Patient with some improvement of his sx, reports anxiety as improving , denies any somatic sx.  Will continue treatment.   Treatment Plan Summary: Daily contact with patient to assess and evaluate symptoms and progress in treatment and Medication management  Will continue Abilify  5 mg po bid for psychosis, mood sx. Will continue Cogentin 0.5 mg po bid for EPS. Will continue Lamictal 50 mg po daily for mood lability, depression.  Will continue Elavil 25 mg po qhs for sleep as well as headaches. TSH reviewed- wnl   Continue Metformin 1000 mg po bid ,Levemir 40 units  qhs, Glipizide 10 mg po bid for DM.   Hba1c->11 - Diabetic consult note reviewed.   Zocor 10 mg po qpm for hyperlipidemia - Dietician consult.  Will continue Naproxen 375 mg  po tid for headaches. Will continue Tylenol prn for pain. Restarted Flonase- nasal spray - once daily . Will continue  Claritine 10 mg po daily for allergies. -  CT scan head - persistent headaches- negative.  Will continue  substance abuse counseling.Referral to [facility] .  Will continue to monitor vitals ,medication compliance and treatment side effects while patient is here.  Will monitor for medical issues as well as call consult as needed CSW will start working on disposition.  Patient to participate in therapeutic milieu .   Medical Decision Making:  Review of Psycho-Social Stressors (1), Review of Last Therapy Session (1) and Review of Medication Regimen & Side Effects (2)     Abad Manard MD 01/22/2015, 3:28 PM

## 2015-01-22 NOTE — [facility] Group Notes (Signed)
[facility]  01/22/2015 1:15 PM   Type of Therapy:  Group Therapy  Participation Level:  Did Not Attend  Reyes IvanChelsea Horton, LCSW 01/22/2015 3:16 PM

## 2015-01-22 NOTE — Plan of Care (Signed)
Problem: Diagnosis: Increased Risk For Suicide Attempt Goal: STG-Patient Will Comply With Medication Regime Outcome: Progressing Pt is complaint with her current medication regimen.

## 2015-01-23 LAB — GLUCOSE, CAPILLARY: Glucose-Capillary: 126 mg/dL — ABNORMAL HIGH (ref 65–99)

## 2015-01-23 MED ORDER — SIMVASTATIN 10 MG PO TABS: 10.0000 mg | ORAL_TABLET | Freq: Every day | ORAL | Status: DC

## 2015-01-23 MED ORDER — BENZTROPINE MESYLATE 0.5 MG PO TABS: 0.5000 mg | ORAL_TABLET | Freq: Two times a day (BID) | ORAL | Status: DC

## 2015-01-23 MED ORDER — HYDROCERIN EX CREA: 1.0000 "application " | TOPICAL_CREAM | Freq: Two times a day (BID) | CUTANEOUS | Status: DC

## 2015-01-23 MED ORDER — GLIPIZIDE 10 MG PO TABS: 10.0000 mg | ORAL_TABLET | Freq: Two times a day (BID) | ORAL | Status: DC

## 2015-01-23 MED ORDER — AMITRIPTYLINE HCL 25 MG PO TABS: 25.0000 mg | ORAL_TABLET | Freq: Every day | ORAL | Status: DC

## 2015-01-23 MED ORDER — LAMOTRIGINE 25 MG PO TABS: 50.0000 mg | ORAL_TABLET | Freq: Every day | ORAL | Status: DC

## 2015-01-23 MED ORDER — ARIPIPRAZOLE 5 MG PO TABS: 5.0000 mg | ORAL_TABLET | Freq: Two times a day (BID) | ORAL | Status: DC

## 2015-01-23 MED ORDER — LORATADINE 10 MG PO TABS: 10.0000 mg | ORAL_TABLET | Freq: Every day | ORAL | Status: DC

## 2015-01-23 MED ORDER — INSULIN DETEMIR 100 UNIT/ML ~~LOC~~ SOLN: 40.0000 [IU] | Freq: Every day | SUBCUTANEOUS | Status: DC

## 2015-01-23 MED ORDER — INSULIN ASPART 100 UNIT/ML FLEXPEN: 3.0000 [IU] | PEN_INJECTOR | Freq: Three times a day (TID) | SUBCUTANEOUS | Status: DC

## 2015-01-23 MED ORDER — FLUTICASONE PROPIONATE 50 MCG/ACT NA SUSP: 2.0000 | Freq: Every day | NASAL | Status: DC

## 2015-01-23 MED ORDER — NICOTINE 21 MG/24HR TD PT24: 21.0000 mg | MEDICATED_PATCH | Freq: Every day | TRANSDERMAL | Status: DC | PRN

## 2015-01-23 MED ORDER — METFORMIN HCL 1000 MG PO TABS: 1000.0000 mg | ORAL_TABLET | Freq: Two times a day (BID) | ORAL | Status: DC

## 2015-01-23 NOTE — [facility] Suicide Risk Assessment (Signed)
[facility]   Demographic Factors:  Male  Total Time spent with patient: 30 minutes  Musculoskeletal: Strength & Muscle Tone: within normal limits Gait & Station: normal Patient leans: N/A  Psychiatric Specialty Exam: Physical Exam  Review of Systems  Psychiatric/Behavioral: Positive for substance abuse.  All other systems reviewed and are negative.   Blood pressure 156/95, pulse 79, temperature 98.2 F (36.8 C), temperature source Oral, resp. rate 18, height 5' 7.58" (1.717 m), weight 97.297 kg (214 lb 8 oz).Body mass index is 33 kg/(m^2).  General Appearance: Casual  Eye Contact::  Fair  Speech:  Normal Rate409  Volume:  Normal  Mood:  Euthymic  Affect:  Appropriate  Thought Process:  Coherent  Orientation:  Full (Time, Place, and Person)  Thought Content:  WDL  Suicidal Thoughts:  No  Homicidal Thoughts:  No  Memory:  Immediate;   Fair Recent;   Fair Remote;   Fair  Judgement:  Fair  Insight:  Fair  Psychomotor Activity:  Normal  Concentration:  Fair  Recall:  [employer]  Fund of Knowledge:Fair  Language: Fair  Akathisia:  No  Handed:  Right  AIMS (if indicated):     Assets:  Communication Skills Desire for Improvement  Sleep:  Number of Hours: 6.75  Cognition: WNL  ADL's:  Intact   Have you used any form of tobacco in the last 30 days? (Cigarettes, Smokeless Tobacco, Cigars, and/or Pipes): Patient Refused Screening  Has this patient used any form of tobacco in the last 30 days? (Cigarettes, Smokeless Tobacco, Cigars, and/or Pipes) No  Mental Status Per Nursing Assessment::   On Admission:  Suicidal ideation indicated by patient, Suicide plan, Belief that plan would result in death  Current Mental Status by Physician: PATIENT DENIES SI/HI/AH/VH  Loss Factors: NA  Historical Factors: Impulsivity  Risk Reduction Factors:   Positive social support, Positive therapeutic relationship and Positive coping skills or problem solving  skills  Continued Clinical Symptoms:  Alcohol/Substance Abuse/Dependencies Previous Psychiatric Diagnoses and Treatments  Cognitive Features That Contribute To Risk:  None    Suicide Risk:  Minimal: No identifiable suicidal ideation.  Patients presenting with no risk factors but with morbid ruminations; may be classified as minimal risk based on the severity of the depressive symptoms  Principal Problem: Schizoaffective disorder, depressive type Discharge Diagnoses:  Patient Active Problem List   Diagnosis Date Noted  . Headache [R51] 01/18/2015  . Hyperlipidemia [E78.5] 01/18/2015  . Hyperprolactinemia [E22.1] 01/18/2015  . Schizoaffective disorder, depressive type [F25.1] 01/16/2015  . Bereavement [Z63.4] 01/16/2015  . Cocaine use disorder, severe, dependence [F14.20] 01/16/2015  . Cannabis use disorder, moderate, dependence [F12.20] 01/16/2015  . Suicidal ideation [R45.851] 11/09/2013  . Diabetes mellitus [E11.9] 03/11/2012  . HTN (hypertension) [I10] 03/11/2012    Follow-up Information    Follow up with [facility] On 01/25/2015.   Why:  Appointment scheduled on this date for intake at 8:30 am, for outpatient medication management and therapy.    Contact information:   [address] [place], [place] [address] Phone: [phone] Fax: [phone]      Follow up with [facility].   Why:  For long term inpatient treatment.  Call daily to check on bed availability   Contact information:   [address] [place], [place] [id number] Phone: [phone] Fax: [phone]      [facility]. Schedule an appointment as soon as possible for a visit in 1 day.   Why:  for elevated  BP, DM      Plan Of Care/Follow-up recommendations:  Activity:  No restrictions Diet:  carb modified, low calorie Tests:  none Other:  follow up with Primary care , [facility] for DM, elevated BP.  Is patient on multiple antipsychotic therapies at  discharge:  No   Has Patient had three or more failed trials of antipsychotic monotherapy by history:  No  Recommended Plan for Multiple Antipsychotic Therapies: NA    Jordanna Hendrie md 01/23/2015, 9:28 AM

## 2015-01-23 NOTE — Progress Notes (Signed)
Pt d/c with bus pass and train ticket. Medication samples and prescriptions given and reviewed. D/c instructions given and reviewed. Pt verbalizes understanding. Pt belongings returned from lock up.

## 2015-01-23 NOTE — Discharge Summary (Signed)
Physician Discharge Summary Note  Patient:  [name] is an 54 y.o., male MRN:  [id number] DOB:  07-19-1961 Patient phone:  [phone] (home)  Patient address:   [address]  [place] [address],  Total Time spent with patient: Greater than 30 minutes  Date of Admission:  01/15/2015  Date of Discharge: 01-23-15  Reason for Admission:  Mood stabilization traetment  Principal Problem: Schizoaffective disorder, depressive type  Discharge Diagnoses: Patient Active Problem List   Diagnosis Date Noted  . Headache [R51] 01/18/2015  . Hyperlipidemia [E78.5] 01/18/2015  . Hyperprolactinemia [E22.1] 01/18/2015  . Schizoaffective disorder, depressive type [F25.1] 01/16/2015  . Bereavement [Z63.4] 01/16/2015  . Cocaine use disorder, severe, dependence [F14.20] 01/16/2015  . Cannabis use disorder, moderate, dependence [F12.20] 01/16/2015  . Suicidal ideation [R45.851] 11/09/2013  . Diabetes mellitus [E11.9] 03/11/2012  . HTN (hypertension) [I10] 03/11/2012   Musculoskeletal: Strength & Muscle Tone: within normal limits Gait & Station: normal Patient leans: N/A  Psychiatric Specialty Exam: Physical Exam  Psychiatric: His speech is normal and behavior is normal. Judgment and thought content normal. His mood appears not anxious. His affect is not angry, not blunt, not labile and not inappropriate. Cognition and memory are normal. He does not exhibit a depressed mood.    Review of Systems  Constitutional: Negative.   HENT: Negative.   Eyes: Negative.   Respiratory: Negative.   Cardiovascular: Negative.   Gastrointestinal: Negative.   Genitourinary: Negative.   Musculoskeletal: Negative.   Skin: Negative.   Neurological: Negative.   Endo/Heme/Allergies: Negative.   Psychiatric/Behavioral: Positive for depression (stable), hallucinations (Hx of) and substance abuse (Cocaine use disorder). Negative for suicidal ideas and memory loss. The patient has insomnia (Stable).  The patient is not nervous/anxious.     Blood pressure 156/95, pulse 79, temperature 98.2 F (36.8 C), temperature source Oral, resp. rate 18, height 5' 7.58" (1.717 m), weight 97.297 kg (214 lb 8 oz).Body mass index is 33 kg/(m^2).  See Md's SRA   Have you used any form of tobacco in the last 30 days? (Cigarettes, Smokeless Tobacco, Cigars, and/or Pipes): Patient Refused Screening  Has this patient used any form of tobacco in the last 30 days? (Cigarettes, Smokeless Tobacco, Cigars, and/or Pipes): Declines this assessment upon discharge. Past Medical History:  Past Medical History  Diagnosis Date  . Diabetes mellitus   . Bipolar affective disorder   . Schizophrenia, schizo-affective   . Hypertension   . Diabetes mellitus 03/11/2012  . Bipolar disorder 03/11/2012  . Anxiety   . Depression   . 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 Finger surgery    . Wisdom tooth extraction     Family History:  Family History  Problem Relation Age of Onset  . Diabetes Mother   . Bipolar disorder Sister    Social History:  History  Alcohol Use No     History  Drug Use  . Yes  . Special: Marijuana, Cocaine    Comment: 4 days ago last THC and cocoaine use... relapsed after 2 yrs     History   Social History  . Marital Status: Single    Spouse Name: N/A  . Number of Children: N/A  . Years of Education: N/A   Social History Main Topics  . Smoking status: Never Smoker   . Smokeless tobacco: Never Used  . Alcohol Use: No  . Drug Use: Yes    Special:  Marijuana, Cocaine     Comment: 4 days ago last THC and cocoaine use... relapsed after 2 yrs   . Sexual Activity: Yes    Birth Control/ Protection: Condom     Comment: marijuana 25 days ago   Other Topics Concern  . None   Social History Narrative   Risk to Self: Is patient at risk for suicide?: Yes Risk to Others: No Prior Inpatient Therapy: Yes Prior Outpatient Therapy:  Yes  Level of Care:  OP  Hospital Course:  [name] is a 54 y.o. AA male who is divorced, on SSD, presented to [facility] seeking support for depression and suicidal ideation. Patient with hx of Schizoaffective disorder as well as polysubstance abuse. Reported that his brother was killed in a car accident a couple of weeks ago and that he feels tremendous guilt because he wasn't able to save him. He also stated that he has been feeling suicidal for about a week and that he wants to end his life by shooting himself, but that he gave his gun to a friend. Pt also reported thoughts about choking his girlfriend who has not been supportive. Patient appeared to be depressed, focussed on his brother's death and its effect on his mental health. Pt reports that he was living in [place] until a few weeks ago, relocated here , and now has trouble transferring his insurance here. Pt reports having been on prozac as well as wellbutrin , but reports no effect from both. Pt also reports being on Risperidone in the past - but his psychiatrist in [place] took him off it , since it was affecting his blood sugar level - since he has a hx of DM.  [name] was admitted to the adult unit for suicidal ideations related to worsening symptoms of Schizoaffective disorder - bipolar-type. He was also using drugs, however, does not need detox treatment as cocaine and or cannabis has no established detox protocols. Again, he was not presenting with any substance withdrawal symptoms. During his admission assessment, he was evaluated and his symptoms identified. Medication management was discussed and initiated targeting those presenting symptoms. He was oriented to the unit and encouraged to participate in the unit programming. His other presenting medical problems were identified and treated appropriately by resuming his pertinent home medications. He tolerated his treatment regimen without any significant adverse effects and or  reactions.  During [name]'s hospital stay, he was evaluated daily by a clinical provider to ascertain his response to his treatment regimen. As the day goes by, improvement was noted by the patient's report of decreasing symptoms, improved sleep, affect, medication tolerance, behavior & participation in the unit programming.  He was required on daily basis to complete a self inventory asssessment noting mood, mental status, pain, any new symptoms, anxiety and or concerns. His symptoms responded well to his treatment regimen. Also, being in a therapeutic and supportive environment assisted in his mood stability. [name] did present appropriate behavior & was motivated for recovery. He worked closely with the treatment team and case manager to develop a discharge plan with appropriate goals to maintain mood stability after discharge. Coping skills, problem solving as well as relaxation therapies were also part of his unit programming.  On this day of his discharge, [name] is in much improved condition than upon admission. His symptoms were reported as significantly decreased or resolved completely. Upon discharge, he adamantly denies any SI/HI & voiced no AVH. He was motivated to continue taking medication with a goal of  continued improvement in mental health. [name] was discharged home with a plan to follow up as noted below. He was medicated & discharged on; Amitriptyline 25 mg for insomnia, Abilify 5 mg for mood control, Cogentin 0.5 mg for EPS & Lamictal 25 mg for mood stabilization. [name] 3 days worth, supply [facility]. [facility] in no apparent distress. Transportation per train.  Consults:  psychiatry  Significant Diagnostic Studies:  labs: CBC with diff, CMP, UDS, toxicology tests, U/A, results reviewed, stable   Consults:  psychiatry  Significant Diagnostic Studies:  labs: CBC with diff, CMP,  UDS, toxicology tests, U/A, results reviewed, stable  Discharge Vitals:   Blood pressure 156/95, pulse 79, temperature 98.2 F (36.8 C), temperature source Oral, resp. rate 18, height 5' 7.58" (1.717 m), weight 97.297 kg (214 lb 8 oz). Body mass index is 33 kg/(m^2). Lab Results:   Results for orders placed or performed during the hospital encounter of 01/15/15 (from the past 72 hour(s))  Glucose, capillary     Status: Abnormal   Collection Time: 01/20/15 11:52 AM  Result Value Ref Range   Glucose-Capillary 230 (H) 65 - 99 mg/dL  Glucose, capillary     Status: Abnormal   Collection Time: 01/20/15  4:53 PM  Result Value Ref Range   Glucose-Capillary 324 (H) 65 - 99 mg/dL  Glucose, capillary     Status: Abnormal   Collection Time: 01/20/15  8:42 PM  Result Value Ref Range   Glucose-Capillary 187 (H) 65 - 99 mg/dL   Comment 1 Notify RN   Glucose, capillary     Status: Abnormal   Collection Time: 01/21/15  6:02 AM  Result Value Ref Range   Glucose-Capillary 145 (H) 65 - 99 mg/dL   Comment 1 Notify RN   Glucose, capillary     Status: Abnormal   Collection Time: 01/21/15 11:59 AM  Result Value Ref Range   Glucose-Capillary 194 (H) 65 - 99 mg/dL  Glucose, capillary     Status: Abnormal   Collection Time: 01/21/15  5:04 PM  Result Value Ref Range   Glucose-Capillary 233 (H) 65 - 99 mg/dL  Glucose, capillary     Status: Abnormal   Collection Time: 01/21/15  9:08 PM  Result Value Ref Range   Glucose-Capillary 146 (H) 65 - 99 mg/dL   Comment 1 Notify RN   Glucose, capillary     Status: Abnormal   Collection Time: 01/22/15  6:09 AM  Result Value Ref Range   Glucose-Capillary 141 (H) 65 - 99 mg/dL   Comment 1 Notify RN   Glucose, capillary     Status: Abnormal   Collection Time: 01/22/15 11:52 AM  Result Value Ref Range   Glucose-Capillary 165 (H) 65 - 99 mg/dL   Comment 1 Notify RN    Comment 2 Document in Chart   Glucose, capillary     Status: Abnormal   Collection Time:  01/22/15  4:28 PM  Result Value Ref Range   Glucose-Capillary 122 (H) 65 - 99 mg/dL  Glucose, capillary     Status: Abnormal   Collection Time: 01/22/15  8:36 PM  Result Value Ref Range   Glucose-Capillary 156 (H) 65 - 99 mg/dL   Comment 1 Notify RN   Glucose, capillary     Status: Abnormal   Collection Time: 01/23/15  6:22 AM  Result Value Ref Range   Glucose-Capillary 126 (H) 65 -  99 mg/dL   Comment 1 Notify RN    Physical Findings: AIMS: Facial and Oral Movements Muscles of Facial Expression: None, normal Lips and Perioral Area: None, normal Jaw: None, normal Tongue: None, normal,Extremity Movements Upper (arms, wrists, hands, fingers): None, normal Lower (legs, knees, ankles, toes): None, normal, Trunk Movements Neck, shoulders, hips: None, normal, Overall Severity Severity of abnormal movements (highest score from questions above): None, normal Incapacitation due to abnormal movements: None, normal Patient's awareness of abnormal movements (rate only patient's report): No Awareness, Dental Status Current problems with teeth and/or dentures?: No Does patient usually wear dentures?: No  CIWA:  CIWA-Ar Total: 1 COWS:  COWS Total Score: 2  See Psychiatric Specialty Exam and Suicide Risk Assessment completed by Attending Physician prior to discharge.  Discharge destination:  Home  Is patient on multiple antipsychotic therapies at discharge:  No   Has Patient had three or more failed trials of antipsychotic monotherapy by history:  No  Recommended Plan for Multiple Antipsychotic Therapies: NA    Medication List    STOP taking these medications        aspirin-acetaminophen-caffeine 250-250-65 MG per tablet  Commonly known as:  EXCEDRIN MIGRAINE     buPROPion 150 MG 12 hr tablet  Commonly known as:  WELLBUTRIN SR     FLUoxetine 40 MG capsule  Commonly known as:  PROZAC      TAKE these medications      Indication   amitriptyline 25 MG tablet  Commonly known as:   ELAVIL  Take 1 tablet (25 mg total) by mouth at bedtime. For insomnia   Indication:  Trouble Sleeping     ARIPiprazole 5 MG tablet  Commonly known as:  ABILIFY  Take 1 tablet (5 mg total) by mouth 2 (two) times daily. For mood control   Indication:  Mood control     benztropine 0.5 MG tablet  Commonly known as:  COGENTIN  Take 1 tablet (0.5 mg total) by mouth 2 (two) times daily. For prevention of EPS   Indication:  Extrapyramidal Reaction caused by Medications     fluticasone 50 MCG/ACT nasal spray  Commonly known as:  FLONASE  Place 2 sprays into both nostrils daily. For allergies   Indication:  Perennial Rhinitis, Hayfever     glipiZIDE 10 MG tablet  Commonly known as:  GLUCOTROL  Take 1 tablet (10 mg total) by mouth 2 (two) times daily before a meal. For diabetes management   Indication:  Type 2 Diabetes     hydrocerin Crea  Apply 1 application topically 2 (two) times daily. For dry skin   Indication:  Dry skin     insulin aspart 100 UNIT/ML FlexPen  Commonly known as:  NOVOLOG FLEXPEN  Inject 3-10 Units into the skin 3 (three) times daily with meals. Per sliding scale: For diabetes management   Indication:  Type 2 Diabetes     insulin detemir 100 UNIT/ML injection  Commonly known as:  LEVEMIR  Inject 0.4 mLs (40 Units total) into the skin at bedtime. For diabetes management   Indication:  Type 2 Diabetes     lamoTRIgine 25 MG tablet  Commonly known as:  LAMICTAL  Take 2 tablets (50 mg total) by mouth daily. For mood stabilization   Indication:  Mood stabilization     loratadine 10 MG tablet  Commonly known as:  CLARITIN  Take 1 tablet (10 mg total) by mouth daily. (May purchase from over the counter at yr local pharmacy): For  allergies   Indication:  Perennial Rhinitis, Hayfever     metFORMIN 1000 MG tablet  Commonly known as:  GLUCOPHAGE  Take 1 tablet (1,000 mg total) by mouth 2 (two) times daily with a meal. For diabetes management   Indication:  Type 2  Diabetes     nicotine 21 mg/24hr patch  Commonly known as:  NICODERM CQ - dosed in mg/24 hours  Place 1 patch (21 mg total) onto the skin daily as needed (smoking cessation).   Indication:  Nicotine Addiction     simvastatin 10 MG tablet  Commonly known as:  ZOCOR  Take 1 tablet (10 mg total) by mouth daily at 6 PM. For high cholsterol   Indication:  Inherited Homozygous Hypercholesterolemia       Follow-up Information    Follow up with [facility] On 01/25/2015.   Why:  Appointment scheduled on this date for intake at 8:30 am, for outpatient medication management and therapy.    Contact information:   [address] [place], [place] [address] Phone: [phone] Fax: [phone]      Follow up with [facility].   Why:  For long term inpatient treatment.  Call daily to check on bed availability   Contact information:   [address] [place], [place] [address] Phone: [phone] Fax: [phone]      Follow up with Primary care, [facility]. Schedule an appointment as soon as possible for a visit in 1 day.   Why:  for elevated BP, DM     Follow-up recommendations: Activity:  As tolerated Diet: As recommended by your primary care doctor. Keep all scheduled follow-up appointments as recommended.   Comments: Take all your medications as prescribed by your mental healthcare provider. Report any adverse effects and or reactions from your medicines to your outpatient provider promptly. Patient is instructed and cautioned to not engage in alcohol and or illegal drug use while on prescription medicines. In the event of worsening symptoms, patient is instructed to call the crisis hotline, 911 and or go to the nearest ED for appropriate evaluation and treatment of symptoms. Follow-up with your primary care provider for your other medical issues, concerns and or health care needs.   Total Discharge Time: Greater than 30 minutes  Signed: Sanjuana Kava,  PMHNP-BC 01/23/2015, 9:50 AM

## 2015-01-23 NOTE — Progress Notes (Signed)
The focus of this group is to help patients review their daily goal of treatment and discuss progress on daily workbooks. Pt attended the evening group session and responded to all discussion prompts from the Writer. Pt shared that today was a good day on the unit, the highlight of which was not feeling the headache that had troubled him in recent days. [name] also mentioned not feeling as irritable as he had in recent days. "I'm moving in the right direction." Pt reported having no additional needs from Nursing Staff this evening. His affect was appropriate.

## 2015-01-23 NOTE — Progress Notes (Signed)
  [facility] Adult Case Management Discharge Plan :  Will you be returning to the same living situation after discharge:  No. Patient plans to stay with family at discharge. At discharge, do you have transportation home?: Yes,  patient [place] Do you have the ability to pay for your medications: Yes,  patient will be provided with medication samples and prescriptions at discharge.  Release of information consent forms completed and in the chart;  Patient's signature needed at discharge.  Patient to Follow up at: Follow-up Information    Follow up with [facility] On 01/25/2015.   Why:  Appointment scheduled on this date for intake at 8:30 am, for outpatient medication management and therapy.    Contact information:   [address] [place], [place] [address] Phone: [phone] Fax: [phone]      Follow up with [facility].   Why:  For long term inpatient treatment.  Call daily to check on bed availability   Contact information:   [address] [place], [place] [address] Phone: [phone] Fax: [phone]      Patient denies SI/HI: Yes,  denies    Safety Planning and Suicide Prevention discussed: Yes,  with patient   Have you used any form of tobacco in the last 30 days? (Cigarettes, Smokeless Tobacco, Cigars, and/or Pipes): Patient Refused Screening  Has patient been referred to the Quitline?: Patient refused referral  [name], [place] CarboKristin Vasquez 01/23/2015, 9:23 AM

## 2015-01-23 NOTE — Clinical Social Work Note (Signed)
CSW called [facility] - no beds today however facility anticipates openings in near future.  Want patient to call daily to inquire about availability, will complete phone screen at that time.  Patient discharge instructions reflect this instruction.    Santa GeneraAnne Rever Pichette, LCSW Clinical Social Worker

## 2015-01-23 NOTE — [facility] Group Notes (Signed)
[facility] Group Notes:  (Nursing/MHT/Case Management/Adjunct)  Date:  01/23/2015  Time:  0915am  Type of Therapy:  Nurse Education  Participation Level:  Active  Participation Quality:  Attentive  Affect:  Blunted and Irritable  Cognitive:  Alert  Insight:  Appropriate  Engagement in Group:  Engaged  Modes of Intervention:  Discussion, Education and Support  Summary of Progress/Problems: Patient attended group, remained engaged and responded appropriately when prompted. Pt was irritable at times. Pt expressed concerns that [facility] in [facility] to catch a [facility]. RN caring for pt made aware of pt's concerns.  Lendell CapriceGuthrie, Adellyn Capek A 01/23/2015, 10:18 AM

## 2015-07-10 DIAGNOSIS — Z139 Encounter for screening, unspecified: Secondary | ICD-10-CM

## 2015-07-12 DIAGNOSIS — Z139 Encounter for screening, unspecified: Secondary | ICD-10-CM

## 2015-07-12 DIAGNOSIS — R739 Hyperglycemia, unspecified: Secondary | ICD-10-CM

## 2015-07-12 NOTE — Congregational Nurse Program (Signed)
Congregational Nurse Program Note  Date of Encounter: 07/10/2015  Past Medical History: Past Medical History  Diagnosis Date  . Diabetes mellitus   . Bipolar affective disorder   . Schizophrenia, schizo-affective   . Hypertension   . Diabetes mellitus 03/11/2012  . Bipolar disorder 03/11/2012  . Anxiety   . Depression   . Hepatitis 06/30/2013    Type C    Encounter Details:     CNP Questionnaire - 07/12/15 1433    Patient Demographics   Is this a new or existing patient? New   Patient is considered a/an Not Applicable   Patient Assistance   Patient's financial/insurance status Uninsured;Low Income   Patient referred to apply for the following financial assistance Not Applicable   Food insecurities addressed Provided food supplies   Transportation assistance No   Assistance securing medications No   Educational health offerings Not Applicable   Encounter Details   Primary purpose of visit Education/Health Concerns   Was an Emergency Department visit averted? Not Applicable   Does patient have a medical provider? No   Patient referred to Not Applicable   Was a mental health screening completed? (GAINS tool) No   Does patient have dental issues? No   Since previous encounter, have you referred patient for abnormal blood pressure that resulted in a new diagnosis or medication change? No   Since previous encounter, have you referred patient for abnormal blood glucose that resulted in a new diagnosis or medication change? No   For Abstraction Use Only   Does patient have insurance? No       Clinic visit for B/P check

## 2015-07-14 LAB — GLUCOSE, POCT (MANUAL RESULT ENTRY): POC Glucose: 334 mg/dl — AB (ref 70–99)

## 2015-07-14 NOTE — Congregational Nurse Program (Signed)
Congregational Nurse Program Note  Date of Encounter: 07/12/2015  Past Medical History: Past Medical History  Diagnosis Date  . Diabetes mellitus   . Bipolar affective disorder   . Schizophrenia, schizo-affective   . Hypertension   . Diabetes mellitus 03/11/2012  . Bipolar disorder 03/11/2012  . Anxiety   . Depression   . Hepatitis 06/30/2013    Type C    Encounter Details:     CNP Questionnaire - 07/12/15 1500    Patient Demographics   Is this a new or existing patient? Existing   Patient is considered a/an Not Applicable   Patient Assistance   Patient's financial/insurance status Uninsured;Low Income   Patient referred to apply for the following financial assistance [employer]   Food insecurities addressed Provided food supplies   Transportation assistance No   Assistance securing medications No   Educational health offerings Diabetes;Nutrition   Encounter Details   Primary purpose of visit Chronic Illness/Condition Visit   Was an Emergency Department visit averted? Not Applicable   Does patient have a medical provider? Yes   Patient referred to Clinic   Was a mental health screening completed? (GAINS tool) No   Does patient have dental issues? No   Since previous encounter, have you referred patient for abnormal blood pressure that resulted in a new diagnosis or medication change? No   Since previous encounter, have you referred patient for abnormal blood glucose that resulted in a new diagnosis or medication change? No   For Abstraction Use Only   Does patient have insurance? No     clinic visit.  Requesting blood sugar check.  CBG elevated.  States is "out of meds".  Instructed him to see Lavinia SharpsMary Ann Placey RN [facility] soon as possible.  Client agreed.  (Ms. Placey has been seeing him)

## 2015-07-24 DIAGNOSIS — R739 Hyperglycemia, unspecified: Secondary | ICD-10-CM

## 2015-07-26 DIAGNOSIS — R739 Hyperglycemia, unspecified: Secondary | ICD-10-CM

## 2015-08-01 ENCOUNTER — Emergency Department (HOSPITAL_COMMUNITY)
Admission: EM | Admit: 2015-08-01 | Discharge: 2015-08-01 | Disposition: A | Discharge status: Other Acute Inpatient Hospital | Payer: Medicare Other | Attending: Emergency Medicine | Admitting: Emergency Medicine

## 2015-08-01 ENCOUNTER — Encounter (HOSPITAL_COMMUNITY): Payer: Self-pay

## 2015-08-01 ENCOUNTER — Inpatient Hospital Stay (HOSPITAL_COMMUNITY)
Admission: AD | Admit: 2015-08-01 | Discharge: 2015-08-10 | DRG: 885 | Disposition: A | Discharge status: Home / Self Care | Payer: Medicare Other | Source: Intra-hospital | Attending: Psychiatry | Admitting: Psychiatry

## 2015-08-01 ENCOUNTER — Encounter (HOSPITAL_COMMUNITY): Payer: Self-pay | Admitting: *Deleted

## 2015-08-01 DIAGNOSIS — F1210 Cannabis abuse, uncomplicated: Secondary | ICD-10-CM | POA: Insufficient documentation

## 2015-08-01 DIAGNOSIS — F419 Anxiety disorder, unspecified: Secondary | ICD-10-CM | POA: Diagnosis present

## 2015-08-01 DIAGNOSIS — F1420 Cocaine dependence, uncomplicated: Secondary | ICD-10-CM | POA: Diagnosis not present

## 2015-08-01 DIAGNOSIS — F1220 Cannabis dependence, uncomplicated: Secondary | ICD-10-CM | POA: Diagnosis present

## 2015-08-01 DIAGNOSIS — Z79899 Other long term (current) drug therapy: Secondary | ICD-10-CM | POA: Insufficient documentation

## 2015-08-01 DIAGNOSIS — F250 Schizoaffective disorder, bipolar type: Secondary | ICD-10-CM | POA: Diagnosis present

## 2015-08-01 DIAGNOSIS — Z794 Long term (current) use of insulin: Secondary | ICD-10-CM | POA: Diagnosis not present

## 2015-08-01 DIAGNOSIS — E785 Hyperlipidemia, unspecified: Secondary | ICD-10-CM | POA: Diagnosis present

## 2015-08-01 DIAGNOSIS — I10 Essential (primary) hypertension: Secondary | ICD-10-CM | POA: Diagnosis present

## 2015-08-01 DIAGNOSIS — F319 Bipolar disorder, unspecified: Secondary | ICD-10-CM | POA: Diagnosis not present

## 2015-08-01 DIAGNOSIS — F1410 Cocaine abuse, uncomplicated: Secondary | ICD-10-CM | POA: Insufficient documentation

## 2015-08-01 DIAGNOSIS — G4700 Insomnia, unspecified: Secondary | ICD-10-CM | POA: Diagnosis present

## 2015-08-01 DIAGNOSIS — F209 Schizophrenia, unspecified: Secondary | ICD-10-CM | POA: Insufficient documentation

## 2015-08-01 DIAGNOSIS — Z833 Family history of diabetes mellitus: Secondary | ICD-10-CM

## 2015-08-01 DIAGNOSIS — Z8619 Personal history of other infectious and parasitic diseases: Secondary | ICD-10-CM | POA: Diagnosis not present

## 2015-08-01 DIAGNOSIS — R112 Nausea with vomiting, unspecified: Secondary | ICD-10-CM | POA: Diagnosis present

## 2015-08-01 DIAGNOSIS — R45851 Suicidal ideations: Secondary | ICD-10-CM

## 2015-08-01 DIAGNOSIS — E221 Hyperprolactinemia: Secondary | ICD-10-CM | POA: Diagnosis present

## 2015-08-01 DIAGNOSIS — E119 Type 2 diabetes mellitus without complications: Secondary | ICD-10-CM | POA: Insufficient documentation

## 2015-08-01 DIAGNOSIS — E1142 Type 2 diabetes mellitus with diabetic polyneuropathy: Secondary | ICD-10-CM | POA: Diagnosis present

## 2015-08-01 DIAGNOSIS — R45850 Homicidal ideations: Secondary | ICD-10-CM | POA: Diagnosis present

## 2015-08-01 DIAGNOSIS — B1920 Unspecified viral hepatitis C without hepatic coma: Secondary | ICD-10-CM | POA: Diagnosis present

## 2015-08-01 LAB — CBC
HCT: 37.8 % — ABNORMAL LOW (ref 39.0–52.0)
Hemoglobin: 12.5 g/dL — ABNORMAL LOW (ref 13.0–17.0)
MCH: 28.7 pg (ref 26.0–34.0)
MCHC: 33.1 g/dL (ref 30.0–36.0)
MCV: 86.7 fL (ref 78.0–100.0)
Platelets: 221 10*3/uL (ref 150–400)
RBC: 4.36 MIL/uL (ref 4.22–5.81)
RDW: 12.7 % (ref 11.5–15.5)
WBC: 8.6 10*3/uL (ref 4.0–10.5)

## 2015-08-01 LAB — COMPREHENSIVE METABOLIC PANEL
ALBUMIN: 3.7 g/dL (ref 3.5–5.0)
ALK PHOS: 58 U/L (ref 38–126)
ALT: 24 U/L (ref 17–63)
ANION GAP: 14 (ref 5–15)
AST: 38 U/L (ref 15–41)
BILIRUBIN TOTAL: 1.2 mg/dL (ref 0.3–1.2)
BUN: 16 mg/dL (ref 6–20)
CALCIUM: 9.5 mg/dL (ref 8.9–10.3)
CO2: 22 mmol/L (ref 22–32)
Chloride: 102 mmol/L (ref 101–111)
Creatinine, Ser: 1.23 mg/dL (ref 0.61–1.24)
GFR calc non Af Amer: >60 mL/min (ref >60)
GLUCOSE: 298 mg/dL — AB (ref 65–99)
Potassium: 4 mmol/L (ref 3.5–5.1)
Sodium: 138 mmol/L (ref 135–145)
TOTAL PROTEIN: 7.5 g/dL (ref 6.5–8.1)

## 2015-08-01 LAB — SALICYLATE LEVEL: Salicylate Lvl: <4 mg/dL (ref 2.8–30.0)

## 2015-08-01 LAB — ACETAMINOPHEN LEVEL

## 2015-08-01 LAB — RAPID URINE DRUG SCREEN, HOSP PERFORMED
Amphetamines: NOT DETECTED
BARBITURATES: NOT DETECTED
Benzodiazepines: NOT DETECTED
COCAINE: POSITIVE — AB
Opiates: NOT DETECTED
Tetrahydrocannabinol: POSITIVE — AB

## 2015-08-01 LAB — CBG MONITORING, ED: Glucose-Capillary: 305 mg/dL — ABNORMAL HIGH (ref 65–99)

## 2015-08-01 LAB — ETHANOL: Alcohol, Ethyl (B): <5 mg/dL (ref <5)

## 2015-08-01 LAB — GLUCOSE, CAPILLARY: GLUCOSE-CAPILLARY: 342 mg/dL — AB (ref 65–99)

## 2015-08-01 MED ORDER — INSULIN DETEMIR 100 UNIT/ML ~~LOC~~ SOLN
40.0000 [IU] | Freq: Every day | SUBCUTANEOUS | Status: DC
  Administered 2015-08-01 – 2015-08-09 (×9): 40 [IU] via SUBCUTANEOUS

## 2015-08-01 MED ORDER — ALUM & MAG HYDROXIDE-SIMETH 200-200-20 MG/5ML PO SUSP: 30.0000 mL | ORAL | Status: DC | PRN

## 2015-08-01 MED ORDER — HYDROXYZINE HCL 25 MG PO TABS: 25.0000 mg | ORAL_TABLET | Freq: Four times a day (QID) | ORAL | Status: DC | PRN

## 2015-08-01 MED ORDER — ARIPIPRAZOLE 10 MG PO TABS: 5.0000 mg | ORAL_TABLET | Freq: Two times a day (BID) | ORAL | Status: DC

## 2015-08-01 MED ORDER — MAGNESIUM HYDROXIDE 400 MG/5ML PO SUSP: 30.0000 mL | Freq: Every day | ORAL | Status: DC | PRN

## 2015-08-01 MED ORDER — GLIPIZIDE 10 MG PO TABS
10.0000 mg | ORAL_TABLET | Freq: Two times a day (BID) | ORAL | Status: DC
  Filled 2015-08-01: qty 1

## 2015-08-01 MED ORDER — ARIPIPRAZOLE 5 MG PO TABS
5.0000 mg | ORAL_TABLET | Freq: Two times a day (BID) | ORAL | Status: DC
  Administered 2015-08-02: 5 mg via ORAL
  Filled 2015-08-01 (×7): qty 1

## 2015-08-01 MED ORDER — INSULIN DETEMIR 100 UNIT/ML ~~LOC~~ SOLN: 40.0000 [IU] | Freq: Every day | SUBCUTANEOUS | Status: DC

## 2015-08-01 MED ORDER — BENZTROPINE MESYLATE 0.5 MG PO TABS
0.5000 mg | ORAL_TABLET | Freq: Two times a day (BID) | ORAL | Status: DC
  Administered 2015-08-02: 0.5 mg via ORAL
  Filled 2015-08-01 (×7): qty 1

## 2015-08-01 MED ORDER — METFORMIN HCL 500 MG PO TABS
1000.0000 mg | ORAL_TABLET | Freq: Two times a day (BID) | ORAL | Status: DC
  Administered 2015-08-01: 1000 mg via ORAL
  Filled 2015-08-01: qty 2

## 2015-08-01 MED ORDER — GLIPIZIDE 10 MG PO TABS: 10.0000 mg | ORAL_TABLET | Freq: Two times a day (BID) | ORAL | Status: DC

## 2015-08-01 MED ORDER — INSULIN ASPART 100 UNIT/ML ~~LOC~~ SOLN: 0.0000 [IU] | Freq: Every day | SUBCUTANEOUS | Status: DC

## 2015-08-01 MED ORDER — GLIPIZIDE 10 MG PO TABS
10.0000 mg | ORAL_TABLET | Freq: Two times a day (BID) | ORAL | Status: DC
  Administered 2015-08-02 – 2015-08-10 (×17): 10 mg via ORAL
  Filled 2015-08-01 (×21): qty 1

## 2015-08-01 MED ORDER — AMITRIPTYLINE HCL 25 MG PO TABS
25.0000 mg | ORAL_TABLET | Freq: Every day | ORAL | Status: DC
  Filled 2015-08-01 (×4): qty 1

## 2015-08-01 MED ORDER — ACETAMINOPHEN 325 MG PO TABS
650.0000 mg | ORAL_TABLET | Freq: Four times a day (QID) | ORAL | Status: DC | PRN
Start: 2015-08-01 — End: 2015-08-10

## 2015-08-01 MED ORDER — INSULIN ASPART 100 UNIT/ML ~~LOC~~ SOLN
0.0000 [IU] | Freq: Three times a day (TID) | SUBCUTANEOUS | Status: DC
  Administered 2015-08-02: 4 [IU] via SUBCUTANEOUS
  Administered 2015-08-02: 11 [IU] via SUBCUTANEOUS
  Administered 2015-08-03: 3 [IU] via SUBCUTANEOUS
  Administered 2015-08-03 – 2015-08-04 (×3): 7 [IU] via SUBCUTANEOUS
  Administered 2015-08-04 – 2015-08-05 (×3): 4 [IU] via SUBCUTANEOUS
  Administered 2015-08-05: 6 [IU] via SUBCUTANEOUS
  Administered 2015-08-06 – 2015-08-07 (×4): 4 [IU] via SUBCUTANEOUS
  Administered 2015-08-07: 3 [IU] via SUBCUTANEOUS
  Administered 2015-08-07: 4 [IU] via SUBCUTANEOUS
  Administered 2015-08-08 (×2): 3 [IU] via SUBCUTANEOUS
  Administered 2015-08-08 – 2015-08-09 (×2): 4 [IU] via SUBCUTANEOUS
  Administered 2015-08-09: 7 [IU] via SUBCUTANEOUS
  Administered 2015-08-09 – 2015-08-10 (×2): 3 [IU] via SUBCUTANEOUS

## 2015-08-01 MED ORDER — METFORMIN HCL 500 MG PO TABS
1000.0000 mg | ORAL_TABLET | Freq: Two times a day (BID) | ORAL | Status: DC
  Administered 2015-08-02 – 2015-08-10 (×17): 1000 mg via ORAL
  Filled 2015-08-01 (×21): qty 2

## 2015-08-01 MED ORDER — BENZTROPINE MESYLATE 1 MG PO TABS: 0.5000 mg | ORAL_TABLET | Freq: Two times a day (BID) | ORAL | Status: DC

## 2015-08-01 NOTE — ED Notes (Signed)
[facility]Kim [facility]  Pt to be transported after dinner tray delivered and medication given.

## 2015-08-01 NOTE — BH Assessment (Addendum)
Tele Assessment Note   Joseph LefortReginald Vasquez is an 55 y.o. male. Pt reports SI with a plan to "take extra pills." Pt reports HI with a plan to "kill gang members." Pt reports a diagnosis of Schizophrenia. Pt reports hearing voices that tell him to harm himself and harm others. Pt denies previous hospitalization and current mental health treatment. Pt states he was receiving mental health treatment in [place]. Pt is currently homeless. Pt denies abuse. Pt reports occasional marijuana and powder cocaine use. Pt denies alcohol use.   Writer consulted with May, NP. Per May, NP Pt meets inpatient criteria. TTS to seek placement.  Diagnosis:  F33.2 MDD, Severe  Past Medical History:  Past Medical History  Diagnosis Date  . Diabetes mellitus   . Bipolar affective disorder (HCC)   . Schizophrenia, schizo-affective (HCC)   . Hypertension   . Diabetes mellitus (HCC) 03/11/2012  . Bipolar disorder (HCC) 03/11/2012  . Anxiety   . Depression   . 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 Finger surgery    . Wisdom tooth extraction      Family History:  Family History  Problem Relation Age of Onset  . Diabetes Mother   . Bipolar disorder Sister     Social History:  reports that he has never smoked. He has never used smokeless tobacco. He reports that he uses illicit drugs (Marijuana and Cocaine). He reports that he does not drink alcohol.  Additional Social History:     CIWA: CIWA-Ar BP: 146/90 mmHg Pulse Rate: 75 COWS:    PATIENT STRENGTHS: (choose at least two) Average or above average intelligence Communication skills  Allergies:  Allergies  Allergen Reactions  . Wellbutrin [Bupropion] Swelling and Hives    Other reaction(s): Unknown    Home Medications:  (Not in a hospital admission)  OB/GYN Status:  No LMP for male patient.  General Assessment Data [facility] TTS Assessment: In system Is  this a Tele or Face-to-Face Assessment?: Tele Assessment Is this an Initial Assessment or a Re-assessment for this encounter?: Initial Assessment Marital status: Single Maiden name: NA Is patient pregnant?: No Pregnancy Status: No Living Arrangements: Other (Comment) (homeless) Can pt return to current living arrangement?: Yes Admission Status: Voluntary Is patient capable of signing voluntary admission?: Yes Referral Source: Self/Family/Friend Insurance type: Medicare     Crisis Care Plan Living Arrangements: Other (Comment) (homeless) Legal Guardian: Other: (self) Name of Psychiatrist: NA Name of Therapist: NA  Education Status Is patient currently in school?: No Current Grade: NA Highest grade of school patient has completed: 12 Name of school: NA Contact person: NA  Risk to self with the past 6 months Suicidal Ideation: Yes-Currently Present Has patient been a risk to self within the past 6 months prior to admission? : Yes Suicidal Intent: Yes-Currently Present Has patient had any suicidal intent within the past 6 months prior to admission? : Yes Is patient at risk for suicide?: Yes Suicidal Plan?: Yes-Currently Present Has patient had any suicidal plan within the past 6 months prior to admission? : Yes Specify Current Suicidal Plan: to take extra pills Access to Means: Yes Specify Access to Suicidal Means: access to pills What has been your use of drugs/alcohol within the last 12 months?: marijuana, cocaine Previous Attempts/Gestures: Yes How many times?: 2 Other Self Harm Risks: nA Triggers for Past Attempts: Unknown Intentional Self Injurious Behavior: None Family Suicide History: No  Recent stressful life event(s): Loss (Comment) (loss of sister and aunt) Persecutory voices/beliefs?: Yes Depression: Yes Depression Symptoms: Despondent, Insomnia, Tearfulness, Isolating, Guilt, Fatigue, Loss of interest in usual pleasures, Feeling worthless/self pity, Feeling  angry/irritable Substance abuse history and/or treatment for substance abuse?: No Suicide prevention information given to non-admitted patients: Not applicable  Risk to Others within the past 6 months Homicidal Ideation: Yes-Currently Present Does patient have any lifetime risk of violence toward others beyond the six months prior to admission? : No Thoughts of Harm to Others: Yes-Currently Present Comment - Thoughts of Harm to Others: "I want to snap on people" Current Homicidal Intent: Yes-Currently Present Current Homicidal Plan: Yes-Currently Present Describe Current Homicidal Plan: "I just want to hurt people" Access to Homicidal Means: No Describe Access to Homicidal Means: NA Identified Victim: "gang members at [place] [employer] History of harm to others?: Yes Assessment of Violence: None Noted Violent Behavior Description: "fighting" Does patient have access to weapons?: No Criminal Charges Pending?: No Does patient have a court date: No Is patient on probation?: No  Psychosis Hallucinations: Auditory Delusions: None noted  Mental Status Report Appearance/Hygiene: Unremarkable, In scrubs Eye Contact: Fair Motor Activity: Freedom of movement Speech: Logical/coherent Level of Consciousness: Alert Mood: Depressed Affect: Appropriate to circumstance Anxiety Level: Moderate Thought Processes: Coherent, Relevant Judgement: Impaired Orientation: Person, Place, Time, Situation, Appropriate for developmental age Obsessive Compulsive Thoughts/Behaviors: None  Cognitive Functioning Concentration: Normal Memory: Recent Intact, Remote Intact IQ: Average Insight: Fair Impulse Control: Fair Appetite: Fair Weight Loss: 0 Weight Gain: 0 Sleep: Decreased Total Hours of Sleep: 5 Vegetative Symptoms: None  ADLScreening [facility] Assessment Services) Patient's cognitive ability adequate to safely complete daily activities?: Yes Patient able to express need for assistance with  ADLs?: Yes Independently performs ADLs?: Yes (appropriate for developmental age)  Prior Inpatient Therapy Prior Inpatient Therapy: No Prior Therapy Dates: NA Prior Therapy Facilty/Provider(s): NA Reason for Treatment: NA  Prior Outpatient Therapy Prior Outpatient Therapy: No Prior Therapy Dates: NA Prior Therapy Facilty/Provider(s): NA Reason for Treatment: N Does patient have an ACCT team?: No Does patient have Intensive In-House Services?  : No Does patient have [facility] services? : No Does patient have P4CC services?: No  ADL Screening (condition at time of admission) Patient's cognitive ability adequate to safely complete daily activities?: Yes Patient able to express need for assistance with ADLs?: Yes Independently performs ADLs?: Yes (appropriate for developmental age)             Merchant navy officer (For Healthcare) Does patient have an advance directive?: No    Additional Information 1:1 In Past 12 Months?: No CIRT Risk: No Elopement Risk: No Does patient have medical clearance?: No     Disposition:  Disposition Initial Assessment Completed for this Encounter: Yes Disposition of Patient: Inpatient treatment program Type of inpatient treatment program: Adult  Emmit Pomfret 08/01/2015 9:49 AM

## 2015-08-01 NOTE — ED Notes (Signed)
Patient was given a snack and drink. A regular diet order taken for lunch. 

## 2015-08-01 NOTE — ED Notes (Signed)
Belongings removed from bedside, security wanded pt, sitter called for.

## 2015-08-01 NOTE — ED Provider Notes (Signed)
The patient appears reasonably stabilized for transfer considering the current resources, flow, and capabilities available in the ED at this time, and I doubt any other [facility] requiring further screening and/or treatment in the ED prior to transfer.  Pt awake/alert, watching Tv, no distress Appropriate for transfer Hyperglycemia noted but meds were just started and he does not appear in DKA   Zadie Rhineonald Kelcy Baeten, MD 08/01/15 1843

## 2015-08-01 NOTE — ED Notes (Signed)
Pt was resting but responded to questions.  Took dinner order.  Afternoon and morning snacks were uneaten.

## 2015-08-01 NOTE — ED Notes (Signed)
[facility], message to be given to nurse to call back.

## 2015-08-01 NOTE — ED Provider Notes (Signed)
CSN: [id number]     Arrival date & time 08/01/15  4098 History   First MD Initiated Contact with Patient 08/01/15 [phone]     Chief Complaint  Patient presents with  . Suicidal     (Consider location/radiation/quality/duration/timing/severity/associated sxs/prior Treatment) Patient is a 55 y.o. male presenting with mental health disorder. The history is provided by the patient.  Mental Health Problem Presenting symptoms: hallucinations (typical voices), suicidal thoughts (no plan) and suicide attempt ("last time I was here" trying to shoot himself)   Presenting symptoms: no disorganized thought process   Degree of incapacity (severity):  Moderate Onset quality:  Gradual Duration:  6 days Timing:  Constant Progression:  Worsening Chronicity:  Recurrent Context: drug abuse (cocaine) and recent medication change (2-3 months ago started abilify)   Context: not alcohol use   Treatment compliance:  All of the time Relieved by:  Nothing Worsened by:  Nothing tried Ineffective treatments:  None tried Associated symptoms: anhedonia, fatigue, feelings of worthlessness and irritability   Risk factors: hx of mental illness and hx of suicide attempts     Past Medical History  Diagnosis Date  . Diabetes mellitus   . Bipolar affective disorder (HCC)   . Schizophrenia, schizo-affective (HCC)   . Hypertension   . Diabetes mellitus (HCC) 03/11/2012  . Bipolar disorder (HCC) 03/11/2012  . Anxiety   . Depression   . Hepatitis 06/30/2013    Type C   Past Surgical History  Procedure Laterality Date  . Finger surgery    . Wisdom tooth extraction     Family History  Problem Relation Age of Onset  . Diabetes Mother   . Bipolar disorder Sister    Social History  Substance Use Topics  . Smoking status: Never Smoker   . Smokeless tobacco: Never Used  . Alcohol Use: No    Review of Systems  Constitutional: Positive for irritability and fatigue.  Psychiatric/Behavioral: Positive for suicidal  ideas (no plan) and hallucinations (typical voices).  All other systems reviewed and are negative.     Allergies  Wellbutrin  Home Medications   Prior to Admission medications   Medication Sig Start Date End Date Taking? Authorizing Provider  amitriptyline (ELAVIL) 25 MG tablet Take 1 tablet (25 mg total) by mouth at bedtime. For insomnia 01/23/15   Sanjuana Kava, NP  ARIPiprazole (ABILIFY) 5 MG tablet Take 1 tablet (5 mg total) by mouth 2 (two) times daily. For mood control 01/23/15   Sanjuana Kava, NP  benztropine (COGENTIN) 0.5 MG tablet Take 1 tablet (0.5 mg total) by mouth 2 (two) times daily. For prevention of EPS 01/23/15   Sanjuana Kava, NP  fluticasone (FLONASE) 50 MCG/ACT nasal spray Place 2 sprays into both nostrils daily. For allergies 01/23/15   Sanjuana Kava, NP  glipiZIDE (GLUCOTROL) 10 MG tablet Take 1 tablet (10 mg total) by mouth 2 (two) times daily before a meal. For diabetes management 01/23/15   Sanjuana Kava, NP  hydrocerin (EUCERIN) CREA Apply 1 application topically 2 (two) times daily. For dry skin 01/23/15   Sanjuana Kava, NP  insulin aspart (NOVOLOG FLEXPEN) 100 UNIT/ML FlexPen Inject 3-10 Units into the skin 3 (three) times daily with meals. Per sliding scale: For diabetes management 01/23/15   Sanjuana Kava, NP  insulin detemir (LEVEMIR) 100 UNIT/ML injection Inject 0.4 mLs (40 Units total) into the skin at bedtime. For diabetes management 01/23/15   Sanjuana Kava, NP  lamoTRIgine (LAMICTAL) 25 MG  tablet Take 2 tablets (50 mg total) by mouth daily. For mood stabilization 01/23/15   Sanjuana KavaAgnes I Nwoko, NP  loratadine (CLARITIN) 10 MG tablet Take 1 tablet (10 mg total) by mouth daily. (May purchase from over the counter at yr local pharmacy): For allergies 01/23/15   Sanjuana KavaAgnes I Nwoko, NP  metFORMIN (GLUCOPHAGE) 1000 MG tablet Take 1 tablet (1,000 mg total) by mouth 2 (two) times daily with a meal. For diabetes management 01/23/15   Sanjuana KavaAgnes I Nwoko, NP  nicotine (NICODERM CQ - DOSED IN  MG/24 HOURS) 21 mg/24hr patch Place 1 patch (21 mg total) onto the skin daily as needed (smoking cessation). 01/23/15   Sanjuana KavaAgnes I Nwoko, NP  simvastatin (ZOCOR) 10 MG tablet Take 1 tablet (10 mg total) by mouth daily at 6 PM. For high cholsterol 01/23/15   Sanjuana KavaAgnes I Nwoko, NP   BP 146/90 mmHg  Pulse 75  Temp(Src) 97.7 F (36.5 C) (Oral)  Resp 18  SpO2 99% Physical Exam  Constitutional: He is oriented to person, place, and time. He appears well-developed and well-nourished. No distress.  HENT:  Head: Normocephalic and atraumatic.  Eyes: Conjunctivae are normal.  Neck: Neck supple. No tracheal deviation present.  Cardiovascular: Normal rate and regular rhythm.   Pulmonary/Chest: Effort normal. No respiratory distress.  Abdominal: Soft. He exhibits no distension.  Neurological: He is alert and oriented to person, place, and time.  Skin: Skin is warm and dry.  Psychiatric: He has a normal mood and affect.  Vitals reviewed.   ED Course  Procedures (including critical care time) Labs Review Labs Reviewed  COMPREHENSIVE METABOLIC PANEL - Abnormal; Notable for the following:    Glucose, Bld 298 (*)    All other components within normal limits  ACETAMINOPHEN LEVEL - Abnormal; Notable for the following:    Acetaminophen (Tylenol), Serum <10 (*)    All other components within normal limits  CBC - Abnormal; Notable for the following:    Hemoglobin 12.5 (*)    HCT 37.8 (*)    All other components within normal limits  URINE RAPID DRUG SCREEN, HOSP PERFORMED - Abnormal; Notable for the following:    Cocaine POSITIVE (*)    Tetrahydrocannabinol POSITIVE (*)    All other components within normal limits  ETHANOL  SALICYLATE LEVEL    Imaging Review No results found. I have personally reviewed and evaluated these images and lab results as part of my medical decision-making.   EKG Interpretation None      MDM   Final diagnoses:  Suicidal ideation    55 y.o. male presents with new  onset SI. Has typical AH which are unchanged for him. Does not endorse plan. High risk with previous unsuccessful attempts. TTS consulted to evaluate for safety.  MEDICALLY CLEAR FOR TRANSFER OR PSYCHIATRIC ADMISSION.     Lyndal Pulleyaniel Sakoya Win, MD 08/01/15 [phone]

## 2015-08-01 NOTE — Progress Notes (Signed)
ADMISSION NOTE:  Joseph LefortReginald Vasquez 55 yo BM reports with a plan for SI to take extra pills.  Auditory Hallucinations tell him to harm himself and others with gun and baseball bat.  Sts desire to attack "gang members".Visibly angry, agitated, and unable to sit still.  Pt states admission process is increasing his anger.  Pt is diabetic with am BS of 298.  Hx of Schizophrenia, Bipolar affective disorder anxiety and depression.  Pt reports use of THC 7 days previous with a normal consumption of 3-4 blunts per day.  Also admits to cocaine use when available with last use 7 days prior.  Currently in bed and very agitated.  Jumps out of bed and waving blanket in air trying to get it to cover bed.  Talking to himself in angry voice in bed.  Incoherent.

## 2015-08-01 NOTE — Progress Notes (Signed)
[facility], bed will be ready at 5pm today. Admission is voluntary, accepting MD is Dr. Lucianne MussKumar, and number for report to be called is [phone].  Ilean SkillMeghan Shuan Statzer, MSW, LCSW Clinical Social Work, Disposition  08/01/2015 [phone]

## 2015-08-01 NOTE — ED Notes (Signed)
TTS at bedside. 

## 2015-08-01 NOTE — ED Notes (Signed)
Pt reports feeling suicidal. Pt reports feeling suicidal since losing his sister and another family member in the last few weeks. Pt states that he does not have a plan at this time. States that he has felt this way in the past. Pt states that he is feeling "irritable".

## 2015-08-01 NOTE — Progress Notes (Signed)
Patient arrived to the unit from the observation unit.  Patient irritable and angry and states he wants to be left alone.  Patient agreed for writer to take his cbg and to give him insulin however patient refused all oral medications tonight.  Patient stated, "I will take my meds tomorrow."  Patient does appear paranoid and jumps to name call.  Patient will not engage in conversation with Clinical research associatewriter.

## 2015-08-01 NOTE — ED Notes (Signed)
Pt ate all of his grapes, but less than half of his cheese pizza for dinner.

## 2015-08-02 ENCOUNTER — Encounter (HOSPITAL_COMMUNITY): Payer: Self-pay | Admitting: Psychiatry

## 2015-08-02 DIAGNOSIS — F250 Schizoaffective disorder, bipolar type: Principal | ICD-10-CM

## 2015-08-02 LAB — GLUCOSE, CAPILLARY
GLUCOSE-CAPILLARY: 155 mg/dL — AB (ref 65–99)
Glucose-Capillary: 292 mg/dL — ABNORMAL HIGH (ref 65–99)
Glucose-Capillary: 95 mg/dL (ref 65–99)
Glucose-Capillary: 193 mg/dL — ABNORMAL HIGH (ref 65–99)

## 2015-08-02 MED ORDER — AMITRIPTYLINE HCL 10 MG PO TABS
10.0000 mg | ORAL_TABLET | Freq: Every day | ORAL | Status: DC
  Filled 2015-08-02: qty 1

## 2015-08-02 MED ORDER — SERTRALINE HCL 50 MG PO TABS
50.0000 mg | ORAL_TABLET | Freq: Every day | ORAL | Status: DC
  Administered 2015-08-02 – 2015-08-03 (×2): 50 mg via ORAL
  Filled 2015-08-02 (×4): qty 1

## 2015-08-02 MED ORDER — ARIPIPRAZOLE 5 MG PO TABS
5.0000 mg | ORAL_TABLET | Freq: Every day | ORAL | Status: DC
  Administered 2015-08-03: 5 mg via ORAL
  Filled 2015-08-02 (×2): qty 1

## 2015-08-02 NOTE — [facility] Counselor (Signed)
Adult Comprehensive Assessment  Patient ID: [name], male DOB: [birth year], 55 y.o. MRN: [id number]  Information Source: Information source: Patient  Current Stressors:  Employment / Job issues: None - patient is disabled Surveyor, quantity / Lack of resources (include bankruptcy): Fixed income Housing / Lack of housing: Pt is living at [facility] Physical health (include injuries & life threatening diseases): Diabetes, Hep C Social relationships: None Substance abuse: Abuses crack cocaine and THC Bereavement: States his brother was just killed in MVA in [place] when he was visiting there in the summer  Living/Environment/Situation:  Living Arrangements: [employer] Living conditions (as described by patient or guardian): fair How long has patient lived in current situation?: a few months What is atmosphere in current home: Supportive  Family History:  Marital status: Divorced Divorced, when?: "Couple of years" What types of issues is patient dealing with in the relationship?: None Does patient have children?: Yes How many children?: 1 How is patient's relationship with their children?: Good relationship with adult daughter  Childhood History:  By whom was/is the patient raised?: Mother Additional childhood history information: Good childhood Description of patient's relationship with caregiver when they were a child: Good relationship Patient's description of current relationship with people who raised him/her: Very good relationship Does patient have siblings?: Yes Number of Siblings: 1 Description of patient's current relationship with siblings: Good relationship with sister Did patient suffer from severe childhood neglect?: No Has patient ever been sexually abused/assaulted/raped as an adolescent or adult?: No Was the patient ever a victim of a crime or a disaster?: No Witnessed domestic violence?: No Has patient been effected by domestic violence as an adult?:  No  Education:  Currently a Consulting civil engineer?: No Learning disability?: No  Employment/Work Situation:  Employment situation: On disability Why is patient on disability: Mental Health How long has patient been on disability: One year Patient's job has been impacted by current illness: No What is the longest time patient has a held a job?: 13 years 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 Has patient ever been in the [employer]?: No Has patient ever served in Buyer, retail?: No  Financial Resources:  Surveyor, quantity resources: Administrator, Civil Service SSDI;Medicaid;Medicare Does patient have a representative payee or guardian?: No  Alcohol/Substance Abuse: Pt has been using THC and crack cocaine If attempted suicide, did drugs/alcohol play a role in this?: No Alcohol/Substance Abuse Treatment Hx: Past Tx, Inpatient If yes, describe treatment: [facility], [facility] Has alcohol/substance abuse ever caused legal problems?: No  Social Support System:  Conservation officer, nature Support System: Fair Museum/gallery exhibitions officer System: Mother Type of faith/religion: None How does patient's faith help to cope with current illness?: N/A  Leisure/Recreation:  Leisure and Hobbies: Watch TV  Strengths/Needs:  What things does the patient do well?: Kind to others In what areas does patient struggle / problems for patient: Drug addiction and depression  Discharge Plan:  Does patient have access to transportation?: Yes- public transit Will patient be returning to same living situation after discharge?: Yes- [employer] Currently receiving community mental health services: No If no, would patient like referral for services when discharged?: Yes (What county?) [place] Does patient have financial barriers related to discharge medications?: No  Summary/Recommendations: Patient is a 55 year old male with a diagnosis of Schizoaffective Disorder, bipolar type. Pt presented to  the hospital with command hallucinations to kill himself, HI towards gang members. Pt reports primary triggers for admission were relapse on crack cocaine and psychosocial stressors.  Patient will benefit from crisis stabilization, medication evaluation, group therapy and psycho education in addition to case management for discharge planning. At discharge it is recommended that Pt remain compliant with established discharge plan and continued treatment.  Chad CordialLauren Carter, LCSWA Clinical Social Work [phone]

## 2015-08-02 NOTE — Plan of Care (Signed)
Problem: Alteration in thought process Goal: LTG-Patient verbalizes understanding importance med regimen (Patient verbalizes understanding of importance of medication regimen and need to continue outpatient care.)  Outcome: Progressing Pt agrees to med regimen. Pt compliant with taking meds.

## 2015-08-02 NOTE — Tx Team (Signed)
Interdisciplinary Treatment Plan Update (Adult) Date: 08/02/2015   Date: 08/02/2015 10:10 AM  Progress in Treatment:  Attending groups: Pt is new to milieu, continuing to assess  Participating in groups: Pt is new to milieu, continuing to assess  Taking medication as prescribed: Yes  Tolerating medication: Yes  Family/Significant othe contact made: No, CSW assessing for appropriate contact Patient understands diagnosis: Continuing to assess Discussing patient identified problems/goals with staff: Yes  Medical problems stabilized or resolved: Yes  Denies suicidal/homicidal ideation: No, Pt recently admitted with SI/HI towards gang members Patient has not harmed self or Others: Yes   New problem(s) identified: None identified at this time.   Discharge Plan or Barriers: Pt will return to [facility] and follow-up with outpatient resources  Additional comments:  Patient and CSW reviewed pt's identified goals and treatment plan. Patient verbalized understanding and agreed to treatment plan. CSW reviewed [facility] "Discharge Process and Patient Involvement" Form. Pt verbalized understanding of information provided and signed form.   Reason for Continuation of Hospitalization:  Depression Medication stabilization Suicidal ideation Withdrawal symptoms Auditory Hallucinations  Estimated length of stay: 3-5 days  Review of initial/current patient goals per problem list:   1.  Goal(s): Patient will participate in aftercare plan  Met:  Yes  Target date: 3-5 days from date of admission   As evidenced by: Patient will participate within aftercare plan AEB aftercare provider and housing plan at discharge being identified.   08/02/15: Pt will return to [facility] and follow-up with outpatient resources  2.  Goal (s): Patient will exhibit decreased depressive symptoms and suicidal ideations.  Met:  No  Target date: 3-5 days from date of admission   As evidenced by: Patient will utilize self  rating of depression at 3 or below and demonstrate decreased signs of depression or be deemed stable for discharge by MD. 08/02/15: Pt was admitted with symptoms of depression, rating 10/10. Pt continues to present with flat affect and depressive symptoms.  Pt will demonstrate decreased symptoms of depression and rate depression at 3/10 or lower prior to discharge.  4.  Goal(s): Patient will demonstrate decreased signs of withdrawal due to substance abuse  Met:  Progressing  Target date: 3-5 days from date of admission   As evidenced by: Patient will produce a CIWA/COWS score of 0, have stable vitals signs, and no symptoms of withdrawal  08/02/15: Pt has COWS score of 4; endorses restlessness, GI upset, and anxiety as symptoms of withdrawal. 5.  Goal(s): Patient will demonstrate decreased signs of psychosis  . Met:  No . Target date: 3-5 days from date of admission  . As evidenced by: Patient will demonstrate decreased frequency of AVH or return to baseline function    -08/02/15: Pt recently admitted with    command hallucinations to harm    himself and others.    Attendees:  Patient:    Family:    Physician: Dr. Parke Poisson, MD  08/02/2015 10:10 AM  Nursing: Lars Pinks, RN Case manager  08/02/2015 10:10 AM  Clinical Social Worker Peri Maris, [place] 08/02/2015 10:10 AM  Other: Erasmo Downer Drinkard, [place] 08/02/2015 10:10 AM  Clinical:  Darrol Angel, RN 08/02/2015 10:10 AM  Other: , RN Charge Nurse 08/02/2015 10:10 AM  Other: Hilda Lias, [place], [place] Clinical Social Work [phone]

## 2015-08-02 NOTE — [facility] Group Notes (Addendum)
[facility] Group Therapy 08/02/2015 1:15pm  Type of Therapy: Mental Health Association Presentation  Pt did not attend, declined invitation.   Chad CordialLauren Carter, LCSWA 08/02/2015 1:29 PM

## 2015-08-02 NOTE — Progress Notes (Signed)
Patient ID: Joseph LefortReginald Spangler, male   DOB: 06/17/1961, 55 y.o.   MRN: [id number] Per State regulations 482.30 this chart was reviewed for medical necessity with respect to the patient's admission/duration of stay.    Next review date: 08/05/15  Thurman CoyerEric Bakari Nikolai, BSN, RN-BC  Case Manager

## 2015-08-02 NOTE — Progress Notes (Signed)
Adult Psychoeducational Group Note  Date:  08/02/2015 Time:  9:58 PM  Group Topic/Focus:  Wrap-Up Group:   The focus of this group is to help patients review their daily goal of treatment and discuss progress on daily workbooks.  Participation Level:  Did Not Attend  Participation Quality:  Did not attend  Affect:  Did not attend  Cognitive:  Did not attend  Insight: None  Engagement in Group:  Did not attend  Modes of Intervention:  Did not attend  Additional Comments:  Patient did not attend wrap up group tonight.  Davionte Lusby L Mykenzi Vanzile 08/02/2015, 9:58 PM

## 2015-08-02 NOTE — Tx Team (Signed)
Initial Interdisciplinary Treatment Plan   PATIENT STRESSORS: Health problems Medication change or noncompliance Substance abuse   PATIENT STRENGTHS: Communication skills General fund of knowledge   PROBLEM LIST: Problem List/Patient Goals Date to be addressed Date deferred Reason deferred Estimated date of resolution  Suicide Risk 08/01/2015     "Leave me alone" 08/01/2015     homelessness 08/01/2015     pyschosis 08/01/2015                                    DISCHARGE CRITERIA:  Ability to meet basic life and health needs Adequate post-discharge living arrangements Improved stabilization in mood, thinking, and/or behavior Motivation to continue treatment in a less acute level of care Need for constant or close observation no longer present Safe-care adequate arrangements made  PRELIMINARY DISCHARGE PLAN: Attend aftercare/continuing care group Placement in alternative living arrangements  PATIENT/FAMIILY INVOLVEMENT: This treatment plan has been presented to and reviewed with the patient, [name].  The patient and family have been given the opportunity to ask questions and make suggestions.  Angeline SlimHill, [place] Aboud M 08/02/2015, 12:44 AM

## 2015-08-02 NOTE — [facility] Suicide Risk Assessment (Signed)
[facility]   Nursing information obtained from:   chart and patient  Demographic factors:   55 year old male Current Mental Status:   see below  Loss Factors:   death of loved ones, to include a sister and an aunt earlier this year, disability Historical Factors:   history of Schizoaffective Disorder, previous psychiatric admissions  Risk Reduction Factors:   resilience  Total Time spent with patient: 45 minutes Principal Problem:  Schizoaffective Disorder  Diagnosis:   Patient Active Problem List   Diagnosis Date Noted  . Schizoaffective disorder, bipolar type (HCC) [F25.0] 08/01/2015  . Headache [R51] 01/18/2015  . Hyperlipidemia [E78.5] 01/18/2015  . Hyperprolactinemia (HCC) [E22.1] 01/18/2015  . Schizoaffective disorder, depressive type (HCC) [F25.1] 01/16/2015  . Bereavement [Z63.4] 01/16/2015  . Cocaine use disorder, severe, dependence (HCC) [F14.20] 01/16/2015  . Cannabis use disorder, moderate, dependence (HCC) [F12.20] 01/16/2015  . Suicidal ideation [R45.851] 11/09/2013  . Diabetes mellitus (HCC) [E11.9] 03/11/2012  . HTN (hypertension) [I10] 03/11/2012     Continued Clinical Symptoms:  Alcohol Use Disorder Identification Test Final Score (AUDIT): 0 The "Alcohol Use Disorders Identification Test", Guidelines for Use in Primary Care, Second Edition.  World Science writerHealth [employer] [facility](WHO). Score between 0-7:  no or low risk or alcohol related problems. Score between 8-15:  moderate risk of alcohol related problems. Score between 16-19:  high risk of alcohol related problems. Score 20 or above:  warrants further diagnostic evaluation for alcohol dependence and treatment.   CLINICAL FACTORS:   55 year old man, who has history of Schizoaffective Disorder, who presents with worsening depression and emergence of suicidal ideations in the context of loss of loved ones. States sister and aunt died recently.    Psychiatric Specialty Exam: Physical Exam   ROS  Blood pressure 136/95, pulse 89, temperature 98.2 F (36.8 C), temperature source Oral, resp. rate 17, height 5\' 9"  (1.753 m), weight 225 lb (102.059 kg), SpO2 100 %.Body mass index is 33.21 kg/(m^2).   see admit note MSE   COGNITIVE FEATURES THAT CONTRIBUTE TO RISK:  Closed-mindedness and Loss of executive function    SUICIDE RISK:   Moderate:  Frequent suicidal ideation with limited intensity, and duration, some specificity in terms of plans, no associated intent, good self-control, limited dysphoria/symptomatology, some risk factors present, and identifiable protective factors, including available and accessible social support.  PLAN OF CARE: Patient will be admitted to inpatient psychiatric unit for stabilization and safety. Will provide and encourage milieu participation. Provide medication management and maked adjustments as needed.  Will follow daily.    Medical Decision Making:  Review of Psycho-Social Stressors (1), Review or order clinical lab tests (1), Established Problem, Worsening (2) and Review of Medication Regimen & Side Effects (2)  I certify that inpatient services furnished can reasonably be expected to improve the patient's condition.   COBOS, FERNANDO 08/02/2015, 5:52 PM

## 2015-08-02 NOTE — H&P (Addendum)
Psychiatric Admission Assessment Adult  Patient Identification: [name] MRN:  [id number] Date of Evaluation:  08/02/2015 Chief Complaint:  " I have been grieving the death of loved ones " Principal Diagnosis:  Schizoaffective Disorder, Depressed  Diagnosis:   Patient Active Problem List   Diagnosis Date Noted  . Schizoaffective disorder, bipolar type (HCC) [F25.0] 08/01/2015  . Headache [R51] 01/18/2015  . Hyperlipidemia [E78.5] 01/18/2015  . Hyperprolactinemia (HCC) [E22.1] 01/18/2015  . Schizoaffective disorder, depressive type (HCC) [F25.1] 01/16/2015  . Bereavement [Z63.4] 01/16/2015  . Cocaine use disorder, severe, dependence (HCC) [F14.20] 01/16/2015  . Cannabis use disorder, moderate, dependence (HCC) [F12.20] 01/16/2015  . Suicidal ideation [R45.851] 11/09/2013  . Diabetes mellitus (HCC) [E11.9] 03/11/2012  . HTN (hypertension) [I10] 03/11/2012   History of Present Illness:: 55 year old male. Known to our unit from prior psychiatric admissions- history of Schizoaffective Disorder. States that one of his sisters and an aunt passed away within a period of two weeks , in December. States that even before then was feeling " a little depressed", but after these losses has felt worse. He reports he has had auditory hallucinations, " on and off ", which sometimes tell him to hurt himself.  States he started to feel suicidal although denies any specific plan and states " I knew I had to come in to the hospital". In  ED,he also reported homicidal ideations with a thought of killing " gang members". At this time denies any homicidal ideations. He has a history of Cocaine and Cannabis Abuse, and states he had been sober for a period of months, until about 7 days ago, when he relapsed x 1 day. Admission UDS is positive for cannabis and for cocaine. Of note, denies alcohol abuse.   Associated Signs/Symptoms: Depression Symptoms:  depressed mood, anhedonia, insomnia, suicidal thoughts  without plan, loss of energy/fatigue, decreased appetite, (Hypo) Manic Symptoms:  At this time denies manic symptoms and does not exhibit any current symptoms . Anxiety Symptoms:  Denies panic , denies agoraphobia, describes recent increased worry, anxiety Psychotic Symptoms: reports chronic auditory hallucinations , telling him to hurt himself- states he has been hearing these for years, " on and off ". They are better today compared to yesterday PTSD Symptoms: Denies  Total Time spent with patient: 45 minutes  Past Psychiatric History: Has been diagnosed with Schizoaffective Disorder,  Several prior psychiatric admissions- has attempted suicide in the past. Last admission was June /16 for depression , suicidal ideations, also in relation to death of loved ones . At that time was discharged on Abilify, 5 mgrs BID, and on Lamictal 50 mgrs QDAY . Has not been following recently as he relocated to [place]. States he has been taking Abilify fairly regularly, but not other medications . Denies history of violence.  Risk to Self: Is patient at risk for suicide?: Yes Risk to Others:   Prior Inpatient Therapy:   Prior Outpatient Therapy:    Alcohol Screening: 1. How often do you have a drink containing alcohol?: Never 9. Have you or someone else been injured as a result of your drinking?: No 10. Has a relative or friend or a doctor or another health worker been concerned about your drinking or suggested you cut down?: No Alcohol Use Disorder Identification Test Final Score (AUDIT): 0 Brief Intervention: Patient declined brief intervention Substance Abuse History in the last 12 months:  States he had been sober from cocaine and from  Cannabis for a period of months, until about 1  week ago, when he used x 1. Denies alcohol abuse .  Consequences of Substance Abuse: Denies  Previous Psychotropic Medications:  As above, was on abilify, lamictal. Also remembers seroquel , but stopped due to weight  gain, and wellbutrin XL, which caused headaches . Psychological Evaluations: No  Past Medical History:  Does not smoke  Past Medical History  Diagnosis Date  . Diabetes mellitus   . Bipolar affective disorder (HCC)   . Schizophrenia, schizo-affective (HCC)   . Hypertension   . Diabetes mellitus (HCC) 03/11/2012  . Bipolar disorder (HCC) 03/11/2012  . Anxiety   . Depression   . 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 Finger surgery    . Wisdom tooth extraction     Family History:  Mother is alive, father passed away- unknown causes, has 4 sisters and as noted, reports brother and sister passed away recently Family History  Problem Relation Age of Onset  . Diabetes Mother   . Bipolar disorder Sister    Family Psychiatric  History: No mental illness in family, no history of substance abuse in family, denies history of suicides in family Social History: Divorced,  No SO, has a 72 year old daughter, lives at [employer] . He is on disability, denies legal issues.   History  Alcohol Use No     History  Drug Use  . Yes  . Special: Marijuana, Cocaine    Comment: 4 days ago last THC and cocoaine use... relapsed after 2 yrs     Social History   Social History  . Marital Status: Single    Spouse Name: N/A  . Number of Children: N/A  . Years of Education: N/A   Social History Main Topics  . Smoking status: Never Smoker   . Smokeless tobacco: Never Used  . Alcohol Use: No  . Drug Use: Yes    Special: Marijuana, Cocaine     Comment: 4 days ago last THC and cocoaine use... relapsed after 2 yrs   . Sexual Activity: Yes    Birth Control/ Protection: Condom     Comment: marijuana 25 days ago   Other Topics Concern  . None   Social History Narrative   Additional Social History:    Pain Medications: Pt denies Prescriptions: Abilify Over the Counter: Pt denies Longest period of sobriety (when/how long): unknown Withdrawal  Symptoms: Agitation, Aggressive/Assaultive, Irritability, Sweats Name of Substance 1: marijuana 1 - Age of First Use: unknown 1 - Amount (size/oz): 3 to 4 "blounts" per day 1 - Frequency: per day 1 - Duration: ongoing 1 - Last Use / Amount: 07/25/2015 Name of Substance 2: cocaine 2 - Age of First Use: unknown 2 - Amount (size/oz): unknkown 2 - Frequency: rarely 2 - Duration: unknown 2 - Last Use / Amount: 07/25/15  Allergies:   Allergies  Allergen Reactions  . Wellbutrin [Bupropion] Swelling and Hives    Other reaction(s): Unknown   Lab Results:  Results for orders placed or performed during the hospital encounter of 08/01/15 (from the past 48 hour(s))  Glucose, capillary     Status: Abnormal   Collection Time: 08/01/15 10:58 PM  Result Value Ref Range   Glucose-Capillary 342 (H) 65 - 99 mg/dL  Glucose, capillary     Status: Abnormal   Collection Time: 08/02/15  6:17 AM  Result Value Ref Range   Glucose-Capillary 292 (H) 65 - 99 mg/dL  Metabolic Disorder Labs:  Lab Results  Component Value Date   HGBA1C 11.9* 01/17/2015   MPG 295 01/17/2015   MPG 266* 03/11/2012   Lab Results  Component Value Date   PROLACTIN 26.5* 01/17/2015   Lab Results  Component Value Date   CHOL 242* 01/17/2015   TRIG 156* 01/17/2015   HDL 41 01/17/2015   CHOLHDL 5.9 01/17/2015   VLDL 31 01/17/2015   LDLCALC 170* 01/17/2015   LDLCALC * 06/11/2010    155        Total Cholesterol/HDL:CHD Risk Coronary Heart Disease Risk Table                     Men   Women  1/2 Average Risk   3.4   3.3  Average Risk       5.0   4.4  2 X Average Risk   9.6   7.1  3 X Average Risk  23.4   11.0        Use the calculated Patient Ratio above and the CHD Risk Table to determine the patient's CHD Risk.        ATP III CLASSIFICATION (LDL):  <100     mg/dL   Optimal  782-956  mg/dL   Near or Above                    Optimal  130-159  mg/dL   Borderline  213-086  mg/dL   High  >578     mg/dL   Very  High    Current Medications: Current Facility-Administered Medications  Medication Dose Route Frequency Provider Last Rate Last Dose  . acetaminophen (TYLENOL) tablet 650 mg  650 mg Oral Q6H PRN Kerry Hough, PA-C      . alum & mag hydroxide-simeth (MAALOX/MYLANTA) 200-200-20 MG/5ML suspension 30 mL  30 mL Oral Q4H PRN Kerry Hough, PA-C      . amitriptyline (ELAVIL) tablet 25 mg  25 mg Oral QHS Kerry Hough, PA-C   25 mg at 08/01/15 2300  . ARIPiprazole (ABILIFY) tablet 5 mg  5 mg Oral BID Kerry Hough, PA-C   5 mg at 08/02/15 0831  . benztropine (COGENTIN) tablet 0.5 mg  0.5 mg Oral BID Kerry Hough, PA-C   0.5 mg at 08/02/15 0831  . glipiZIDE (GLUCOTROL) tablet 10 mg  10 mg Oral BID AC Kerry Hough, PA-C   10 mg at 08/02/15 0831  . hydrOXYzine (ATARAX/VISTARIL) tablet 25 mg  25 mg Oral Q6H PRN Kerry Hough, PA-C      . insulin aspart (novoLOG) injection 0-20 Units  0-20 Units Subcutaneous TID WC Kerry Hough, PA-C   11 Units at 08/02/15 [phone]  . insulin aspart (novoLOG) injection 0-5 Units  0-5 Units Subcutaneous QHS Kerry Hough, PA-C   0 Units at 08/01/15 2315  . insulin detemir (LEVEMIR) injection 40 Units  40 Units Subcutaneous QHS Zadie Rhine, MD   40 Units at 08/01/15 2326  . magnesium hydroxide (MILK OF MAGNESIA) suspension 30 mL  30 mL Oral Daily PRN Kerry Hough, PA-C      . metFORMIN (GLUCOPHAGE) tablet 1,000 mg  1,000 mg Oral BID WC Kerry Hough, PA-C   1,000 mg at 08/02/15 0831   PTA Medications: Prescriptions prior to admission  Medication Sig Dispense Refill Last Dose  . amitriptyline (ELAVIL) 25 MG tablet Take 1 tablet (25 mg total) by mouth at bedtime. For  insomnia 30 tablet 0 Past Week at Unknown time  . ARIPiprazole (ABILIFY) 5 MG tablet Take 1 tablet (5 mg total) by mouth 2 (two) times daily. For mood control 60 tablet 0 Past Week at Unknown time  . benztropine (COGENTIN) 0.5 MG tablet Take 1 tablet (0.5 mg total) by mouth 2 (two)  times daily. For prevention of EPS 60 tablet 0 Past Week at Unknown time  . glipiZIDE (GLUCOTROL) 10 MG tablet Take 1 tablet (10 mg total) by mouth 2 (two) times daily before a meal. For diabetes management   08/01/2015 at Unknown time  . insulin aspart (NOVOLOG FLEXPEN) 100 UNIT/ML FlexPen Inject 3-10 Units into the skin 3 (three) times daily with meals. Per sliding scale: For diabetes management 15 mL 11 07/31/2015 at Unknown time  . insulin detemir (LEVEMIR) 100 UNIT/ML injection Inject 0.4 mLs (40 Units total) into the skin at bedtime. For diabetes management 10 mL 11 07/31/2015 at Unknown time  . loratadine (CLARITIN) 10 MG tablet Take 1 tablet (10 mg total) by mouth daily. (May purchase from over the counter at yr local pharmacy): For allergies   07/31/2015 at Unknown time  . metFORMIN (GLUCOPHAGE) 1000 MG tablet Take 1 tablet (1,000 mg total) by mouth 2 (two) times daily with a meal. For diabetes management   08/01/2015 at Unknown time    Musculoskeletal: Strength & Muscle Tone: within normal limits Gait & Station: normal Patient leans: N/A  Psychiatric Specialty Exam: Physical Exam  Review of Systems  Constitutional: Negative.   Eyes: Negative.   Respiratory: Negative.   Cardiovascular: Negative.   Gastrointestinal: Positive for nausea, vomiting and diarrhea. Negative for heartburn, abdominal pain and blood in stool.       Over the last day or two. Vomited x 1  Genitourinary: Negative.   Musculoskeletal: Negative.   Skin: Negative.   Neurological: Positive for sensory change and headaches. Negative for seizures.       Describes history of peripheral neuropathy  Endo/Heme/Allergies: Negative.   Psychiatric/Behavioral: Positive for depression, suicidal ideas, hallucinations and substance abuse.  All other systems reviewed and are negative.   Blood pressure 136/95, pulse 89, temperature 98.2 F (36.8 C), temperature source Oral, resp. rate 17, height 5\' 9"  (1.753 m), weight 225 lb  (102.059 kg), SpO2 100 %.Body mass index is 33.21 kg/(m^2).  General Appearance: Fairly Groomed  Patent attorney::  Fair  Speech:  Normal Rate  Volume:  Decreased  Mood:  describes mood as depressed and angry  Affect:  Constricted  Thought Process:  Linear  Orientation:  Other:  fully alert and attentive   Thought Content:  (+) auditory hallucinations, reported as improving, denies visual hallucinations, no delusions expressed, does not appear internally preoccupied at this time  Suicidal Thoughts:  No- denies any current suicidal ideations and contracts for safety on the unit   Homicidal Thoughts:  No- at this time denies any homicidal ideations or violent ideations towards anyone   Memory:  recent and remote grossly intact   Judgement:  Fair  Insight:  Fair  Psychomotor Activity:  Normal  Concentration:  Good  Recall:  Good  Fund of Knowledge:Good  Language: Good  Akathisia:  Negative  Handed:  Right  AIMS (if indicated):     Assets:  Desire for Improvement Resilience  ADL's:   Fair   Cognition: WNL  Sleep:  Number of Hours: 6     Treatment Plan Summary: Daily contact with patient to assess and evaluate symptoms and progress  in treatment, Medication management, Plan inpatient admission and medications as below   Observation Level/Precautions:  15 minute checks  Laboratory:  as needed  EKG, HgbA1C, Lipid Panel   Psychotherapy:  Milieu, support, groups   Medications:   Will continue Abilify- states he has tolerated it well in the past, and have been taking " on and off " up to admission-  as patient reports some nausea, vomiting x 1 today, will decrease Abilify dose to 5 mgrs QDAY although it is not clear that GI symptoms are related to this medication D/C Cogentin Addendum- QTc 490- will D/C Elavil  Remembers  Zoloft as well tolerated and effective - will start Zoloft 50 mgrs QDAY   Consultations:  As needed   Discharge Concerns:  -  Estimated LOS: 6 days   Other:     I  certify that inpatient services furnished can reasonably be expected to improve the patient's condition.   Milderd Manocchio 1/5/20172:51 PM

## 2015-08-02 NOTE — Progress Notes (Signed)
Patient woke up this AM less agitated and requesting his belongings from his locker.  Patient was given his clothing and shoes.  Patient had 2 insulin pens with needles the he requested be refrigerated.  Patient aware that he has paper to be signed when he gets back from breakfast.

## 2015-08-02 NOTE — Plan of Care (Signed)
Problem: Alteration in thought process Goal: LTG-Patient behavior demonstrates decreased signs psychosis (Patient behavior demonstrates decreased signs of psychosis to the point the patient is safe to return home and continue treatment in an outpatient setting.)  Outcome: Not Progressing Patient's first night on the unit.  Patient is paranoid and continues to hear voices telling him to harm himself and others.

## 2015-08-02 NOTE — Progress Notes (Signed)
D: Pt calm and cooperative on approach but pt can be easily agitated. Pt mood is labile. Pt reports depression 10/10. Hopeless 10/10. Anxiety 10/10. Pt endorses suicidal thoughts w/no a plan. Pt verbally contracts for safety. Pt c/o withdrawal symptoms of runny nose and irritability. Pt reports intermittent AH. Pt denies HI at this time. Pt compliant with taking meds. No adverse reaction to meds verbalized by pt. Pt have minimal interaction on the unit and appears withdrawn. A: Medications administered as ordered per MD. Verbal support given. Pt encouraged to attend groups. 15 minute checks performed for safety. R: Pt safety maintained.

## 2015-08-02 NOTE — Progress Notes (Signed)
D:Patient in his room on approach.  Patient states he wants to sleep.  Patient irritable but states he is ok.  Patient denies SI/HI and states he is having auditory hallucinations but states they are becoming less.  Patient did not come out of his room tonight. A: Staff to monitor Q 15 mins for safety.  Encouragement and support offered.  Scheduled medications administered per orders. R: Patient remains safe on the unit.  Patient did not attend group tonight.  Patient not visible on the unit tonight.  Patient taking administered medications.

## 2015-08-03 DIAGNOSIS — F1220 Cannabis dependence, uncomplicated: Secondary | ICD-10-CM

## 2015-08-03 DIAGNOSIS — R112 Nausea with vomiting, unspecified: Secondary | ICD-10-CM

## 2015-08-03 DIAGNOSIS — F1420 Cocaine dependence, uncomplicated: Secondary | ICD-10-CM

## 2015-08-03 DIAGNOSIS — E221 Hyperprolactinemia: Secondary | ICD-10-CM

## 2015-08-03 LAB — GLUCOSE, CAPILLARY
GLUCOSE-CAPILLARY: 220 mg/dL — AB (ref 65–99)
Glucose-Capillary: 183 mg/dL — ABNORMAL HIGH (ref 65–99)

## 2015-08-03 LAB — LIPID PANEL
Cholesterol: 261 mg/dL — ABNORMAL HIGH (ref 0–200)
HDL: 46 mg/dL (ref >40)
LDL CALC: 183 mg/dL — AB (ref 0–99)
Total CHOL/HDL Ratio: 5.7 RATIO
Triglycerides: 162 mg/dL — ABNORMAL HIGH (ref <150)
VLDL: 32 mg/dL (ref 0–40)

## 2015-08-03 MED ORDER — ZOLPIDEM TARTRATE 5 MG PO TABS
5.0000 mg | ORAL_TABLET | Freq: Every day | ORAL | Status: DC
  Administered 2015-08-03 – 2015-08-09 (×5): 5 mg via ORAL
  Filled 2015-08-03 (×6): qty 1

## 2015-08-03 MED ORDER — SIMVASTATIN 20 MG PO TABS
20.0000 mg | ORAL_TABLET | Freq: Every day | ORAL | Status: DC
  Administered 2015-08-04 – 2015-08-09 (×7): 20 mg via ORAL
  Filled 2015-08-03 (×8): qty 1

## 2015-08-03 MED ORDER — LAMOTRIGINE 25 MG PO TABS
25.0000 mg | ORAL_TABLET | Freq: Every day | ORAL | Status: DC
  Administered 2015-08-03 – 2015-08-04 (×2): 25 mg via ORAL
  Filled 2015-08-03 (×3): qty 1

## 2015-08-03 MED ORDER — SERTRALINE HCL 25 MG PO TABS
25.0000 mg | ORAL_TABLET | Freq: Every day | ORAL | Status: DC
  Administered 2015-08-04 – 2015-08-06 (×3): 25 mg via ORAL
  Filled 2015-08-03 (×5): qty 1

## 2015-08-03 MED ORDER — ONDANSETRON 4 MG PO TBDP: 8.0000 mg | ORAL_TABLET | Freq: Three times a day (TID) | ORAL | Status: DC | PRN

## 2015-08-03 NOTE — [facility] Group Notes (Signed)
[facility] LCSW Group Therapy 08/03/2015 1:15pm  Type of Therapy: Group Therapy- Feelings Around Relapse and Recovery  Pt did not attend, declined invitation.   Chad CordialLauren Carter, Theresia MajorsLCSWA [phone] 08/03/2015 3:57 PM

## 2015-08-03 NOTE — Progress Notes (Signed)
[facility] MD Progress Note  08/03/2015 11:33 AM [name]  MRN:  [id number] Subjective:  Patient states " I threw up a lot last night. I still feel nauseous .'  Objective: 55 year old AA male. Known to our unit from prior psychiatric admissions- history of Schizoaffective Disorder, as well as cocaine, cannabis use disorder, who presented with worsening sx of mood lability , stating that one of his sisters and an aunt passed away within a period of two weeks , in December.  Patient seen and chart reviewed.Discussed patient with treatment team.I have also reviewed notes in EHR per Dr.Cobos. Pt this AM reports concerns of nausea as well as vomiting. Pt reports he threw up last night and also continues feel nauseous . Pt appears irritable, but cooperative . Pt reports sleep issues last night , reports he could not sleep because of his GI issues. Pt denies any AH/VH - reports they have improved. Pt reports he does not want to be on the Abilify anymore. It was discussed with pt that his QT on EKG is prolonged and that we have to repeat EKG to monitor. Pt agress with plan , but reports he does not want to be on Abilify anymore.   Per staff - pt with nausea and vomiting, currently is calm. Pt denies any other concerns.VS wnl.     Principal Problem: Schizoaffective disorder, bipolar type (HCC) Diagnosis:   Patient Active Problem List   Diagnosis Date Noted  . Cocaine use disorder, moderate, dependence (HCC) [F14.20] 08/03/2015  . Nausea with vomiting [R11.2] 08/03/2015  . Cannabis use disorder, moderate, dependence (HCC) [F12.20] 08/03/2015  . Schizoaffective disorder, bipolar type (HCC) [F25.0] 08/01/2015  . Hyperlipidemia [E78.5] 01/18/2015  . Hyperprolactinemia (HCC) [E22.1] 01/18/2015  . Diabetes mellitus (HCC) [E11.9] 03/11/2012  . HTN (hypertension) [I10] 03/11/2012   Total Time spent with patient: 30 minutes  Past Psychiatric History:Per EHR " Has been diagnosed with Schizoaffective  Disorder, Several prior psychiatric admissions- has attempted suicide in the past. Last admission was June /16 for depression , suicidal ideations, also in relation to death of loved ones . At that time was discharged on Abilify, 5 mgrs BID, and on Lamictal 50 mgrs QDAY . Has not been following recently as he relocated to [place]. States he has been taking Abilify fairly regularly, but not other medications . Denies history of violence. "  Past Medical History:  Past Medical History  Diagnosis Date  . Diabetes mellitus   . Bipolar affective disorder (HCC)   . Schizophrenia, schizo-affective (HCC)   . Hypertension   . Diabetes mellitus (HCC) 03/11/2012  . Bipolar disorder (HCC) 03/11/2012  . Anxiety   . Depression   . 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 Finger surgery    . Wisdom tooth extraction     Family History:  Family History  Problem Relation Age of Onset  . Diabetes Mother   . Bipolar disorder Sister    Family Psychiatric  History: Pt denies hx of mental illness, substance abuse or suicide in family. Social History: Patient is divorced, is on SSD, lives with mother. History  Alcohol Use No     History  Drug Use  . Yes  . Special: Marijuana, Cocaine    Comment: 4 days ago last THC and cocoaine use... relapsed after 2 yrs     Social History   Social History  . Marital Status: Single  Spouse Name: N/A  . Number of Children: N/A  . Years of Education: N/A   Social History Main Topics  . Smoking status: Never Smoker   . Smokeless tobacco: Never Used  . Alcohol Use: No  . Drug Use: Yes    Special: Marijuana, Cocaine     Comment: 4 days ago last THC and cocoaine use... relapsed after 2 yrs   . Sexual Activity: Yes    Birth Control/ Protection: Condom     Comment: marijuana 25 days ago   Other Topics Concern  . None   Social History Narrative   Additional Social History:    Pain Medications: Pt  denies Prescriptions: Abilify Over the Counter: Pt denies Longest period of sobriety (when/how long): unknown Withdrawal Symptoms: Agitation, Aggressive/Assaultive, Irritability, Sweats Name of Substance 1: marijuana 1 - Age of First Use: unknown 1 - Amount (size/oz): 3 to 4 "blounts" per day 1 - Frequency: per day 1 - Duration: ongoing 1 - Last Use / Amount: 07/25/2015 Name of Substance 2: cocaine 2 - Age of First Use: unknown 2 - Amount (size/oz): unknkown 2 - Frequency: rarely 2 - Duration: unknown 2 - Last Use / Amount: 07/25/15                Sleep: Poor  Appetite:  Fair  Current Medications: Current Facility-Administered Medications  Medication Dose Route Frequency Provider Last Rate Last Dose  . acetaminophen (TYLENOL) tablet 650 mg  650 mg Oral Q6H PRN Kerry HoughSpencer E Simon, PA-C      . alum & mag hydroxide-simeth (MAALOX/MYLANTA) 200-200-20 MG/5ML suspension 30 mL  30 mL Oral Q4H PRN Kerry HoughSpencer E Simon, PA-C      . glipiZIDE (GLUCOTROL) tablet 10 mg  10 mg Oral BID AC Kerry HoughSpencer E Simon, PA-C   10 mg at 08/03/15 0820  . hydrOXYzine (ATARAX/VISTARIL) tablet 25 mg  25 mg Oral Q6H PRN Kerry HoughSpencer E Simon, PA-C      . insulin aspart (novoLOG) injection 0-20 Units  0-20 Units Subcutaneous TID WC Kerry HoughSpencer E Simon, PA-C   7 Units at 08/03/15 [phone]  . insulin aspart (novoLOG) injection 0-5 Units  0-5 Units Subcutaneous QHS Kerry HoughSpencer E Simon, PA-C   0 Units at 08/01/15 2315  . insulin detemir (LEVEMIR) injection 40 Units  40 Units Subcutaneous QHS Zadie Rhineonald Wickline, MD   40 Units at 08/02/15 2228  . lamoTRIgine (LAMICTAL) tablet 25 mg  25 mg Oral QHS Reily Ilic, MD      . magnesium hydroxide (MILK OF MAGNESIA) suspension 30 mL  30 mL Oral Daily PRN Kerry HoughSpencer E Simon, PA-C      . metFORMIN (GLUCOPHAGE) tablet 1,000 mg  1,000 mg Oral BID WC Kerry HoughSpencer E Simon, PA-C   1,000 mg at 08/03/15 0820  . ondansetron (ZOFRAN-ODT) disintegrating tablet 8 mg  8 mg Oral Q8H PRN Jomarie LongsSaramma Jisselle Poth, MD      . Melene Muller[START  ON 08/04/2015] sertraline (ZOLOFT) tablet 25 mg  25 mg Oral Daily Jomarie LongsSaramma Pricella Gaugh, MD      . Melene Muller[START ON 08/04/2015] simvastatin (ZOCOR) tablet 20 mg  20 mg Oral q1800 Daryan Cagley, MD      . zolpidem (AMBIEN) tablet 5 mg  5 mg Oral QHS Jomarie LongsSaramma Lariza Cothron, MD        Lab Results:  Results for orders placed or performed during the hospital encounter of 08/01/15 (from the past 48 hour(s))  Glucose, capillary     Status: Abnormal   Collection Time: 08/01/15 10:58 PM  Result Value  Ref Range   Glucose-Capillary 342 (H) 65 - 99 mg/dL  Glucose, capillary     Status: Abnormal   Collection Time: 08/02/15  6:17 AM  Result Value Ref Range   Glucose-Capillary 292 (H) 65 - 99 mg/dL  Glucose, capillary     Status: None   Collection Time: 08/02/15 11:31 AM  Result Value Ref Range   Glucose-Capillary 95 65 - 99 mg/dL   Comment 1 Notify RN    Comment 2 Document in Chart   Glucose, capillary     Status: Abnormal   Collection Time: 08/02/15  4:28 PM  Result Value Ref Range   Glucose-Capillary 193 (H) 65 - 99 mg/dL   Comment 1 Notify RN    Comment 2 Document in Chart   Glucose, capillary     Status: Abnormal   Collection Time: 08/02/15  8:37 PM  Result Value Ref Range   Glucose-Capillary 155 (H) 65 - 99 mg/dL  Glucose, capillary     Status: Abnormal   Collection Time: 08/03/15  6:11 AM  Result Value Ref Range   Glucose-Capillary 220 (H) 65 - 99 mg/dL   Comment 1 Notify RN   Lipid panel     Status: Abnormal   Collection Time: 08/03/15  6:54 AM  Result Value Ref Range   Cholesterol 261 (H) 0 - 200 mg/dL   Triglycerides 981 (H) <150 mg/dL   HDL 46 >19 mg/dL   Total CHOL/HDL Ratio 5.7 RATIO   VLDL 32 0 - 40 mg/dL   LDL Cholesterol 147 (H) 0 - 99 mg/dL    Comment:        Total Cholesterol/HDL:CHD Risk Coronary Heart Disease Risk Table                     Men   Women  1/2 Average Risk   3.4   3.3  Average Risk       5.0   4.4  2 X Average Risk   9.6   7.1  3 X Average Risk  23.4   11.0        Use  the calculated Patient Ratio above and the CHD Risk Table to determine the patient's CHD Risk.        ATP III CLASSIFICATION (LDL):  <100     mg/dL   Optimal  829-562  mg/dL   Near or Above                    Optimal  130-159  mg/dL   Borderline  130-865  mg/dL   High  >784     mg/dL   Very High Performed at [facility]     Physical Findings: AIMS:  , ,  ,  ,    CIWA:  CIWA-Ar Total: 14 COWS:  COWS Total Score: 4  Musculoskeletal: Strength & Muscle Tone: within normal limits Gait & Station: normal Patient leans: N/A  Psychiatric Specialty Exam: Review of Systems  Psychiatric/Behavioral: Positive for depression and substance abuse. The patient has insomnia.   All other systems reviewed and are negative.   Blood pressure 128/92, pulse 84, temperature 97.9 F (36.6 C), temperature source Oral, resp. rate 20, height 5\' 9"  (1.753 m), weight 102.059 kg (225 lb), SpO2 100 %.Body mass index is 33.21 kg/(m^2).  General Appearance: Casual  Eye Contact::  Minimal  Speech:  Normal Rate  Volume:  Increased  Mood:  Irritable  Affect:  Congruent  Thought Process:  Goal Directed  Orientation:  Full (Time, Place, and Person)  Thought Content:  Rumination  Suicidal Thoughts:  No  Homicidal Thoughts:  No  Memory:  Immediate;   Fair Recent;   Fair Remote;   Fair  Judgement:  Impaired  Insight:  Shallow  Psychomotor Activity:  Decreased  Concentration:  Poor  Recall:  [employer] of Knowledge:Fair  Language: Fair  Akathisia:  No    AIMS (if indicated):     Assets:  Desire for Improvement  ADL's:  Intact  Cognition: WNL  Sleep:  Number of Hours: 5   Treatment Plan Summary:Patient presented with worsening mood sx. Pt was started on Zoloft and Abilify per Dr.Cobbos. However pt developed Nausea and vomiting , ? 2/2 zoloft ( he received first dose last PM). Pt also with Prolonged QT. Pt is on Abilify. Pt reports he does not want to be on Abilify. Hence will DC Abilify. Will  repeat EKG for QT prolongation.   Daily contact with patient to assess and evaluate symptoms and progress in treatment and Medication management   Reviewed past medical records,treatment plan.   Will reduce Zoloft to 25 mg - but will hold it for now 2/2 nausea and vomitting. Could restart it tomorrow , if he improves. Could provide it with snacks to help with GI issues.  Will DC Abilify for pt refusal to take it. Pt also with prolonged QT- repeat EKG and if EKG is improved , could consider Haldol , since it is the safest when it comes to risk of QT prolonagtion.  Will add Lamictal 25 mg po qhs for mood lability.  Will add Ambien 5 mg po qhs for sleep tonight- however after repeat EKG and if his GI issues improves - his sleep medication can be reassessed and he could be started on another one like Trazodone, remeron or TCA's like Doxepin ( given he has no past hx of seizures or cardiac issues )   Will provide Zofran 8 mg po prn for GI sx. Will encourage PO fluids. Will monitor pt closely , call Hospitalist consult if sx worsens.Conrad NP to follow up.  Will continue to monitor vitals ,medication compliance and treatment side effects while patient is here.  Will monitor for medical issues as well as call consult as needed.   Reviewed labs ,lipid panel abnormal - pt with hx of hyperlipidemia, will restart Zocor 20 mg po qpm - start Saturday.Dietician consult.  CSW will start working on disposition.  Patient to participate in therapeutic milieu .          Eivan Gallina MD 08/03/2015, 11:33 AM

## 2015-08-03 NOTE — Progress Notes (Signed)
Patient vomited moderate amount tonight.  Patient states his stomach is upset.  Patient will not take anything offered from staff.  Patient refused crackers, gingerale, and states he does not want any medicine.  Will continue to monitor.

## 2015-08-03 NOTE — [facility] Suicide Risk Assessment (Signed)
[facility] INPATIENT:  Family/Significant Other Suicide Prevention Education  Suicide Prevention Education:  Patient Refusal for Family/Significant Other Suicide Prevention Education: The patient [name] has refused to provide written consent for family/significant other to be provided Family/Significant Other Suicide Prevention Education during admission and/or prior to discharge.  Physician notified. SPE reviewed with patient and brochure provided. Patient encouraged to return to hospital if having suicidal thoughts, patient verbalized his/her understanding and has no further questions at this time.   Elaina Hoopsarter, Glynis Hunsucker M 08/03/2015, 12:47 PM

## 2015-08-03 NOTE — [facility] Group Notes (Signed)
[facility]  08/03/2015 8:45 AM  Participation Quality: Alert, Appropriate and Oriented  Mood/Affect: appropriate, Improving  Depression Rating: 7  Anxiety Rating: 7  Thoughts of Suicide: Pt denies SI/HI  Will you contract for safety? Yes  Current AVH: Pt endorses some AH  Plan for Discharge/Comments: Pt attended discharge planning group and actively participated in group. CSW discussed suicide prevention education with the group and encouraged them to discuss discharge planning and any relevant barriers.Pt reports that he is feeling better physically today and his mood is improving. Pt reports that his voices are "only there a little."  Transportation Means: Pt reports access to transportation  Supports: No supports mentioned at this time  Chad CordialLauren Carter, Theresia MajorsLCSWA 08/03/2015 12:47 PM

## 2015-08-03 NOTE — Progress Notes (Signed)
Patient still states he feels nauseated this AM.  Patient refuses anything to drink or eat.  Patient states he is not going to go down for breakfast this morning.  Ije[place] NP notifed at this time about patient and that he is diabetic.  Patient in room in bed.

## 2015-08-03 NOTE — [facility] Group Notes (Signed)
Pt did not attend wrap up group.  Jewelene Mairena, MHT 

## 2015-08-03 NOTE — Progress Notes (Addendum)
[name] is quiet and seen out in the milieu..he interacts appropriately and he attends his groups. HE completed his daily assessment and on it he wrote he denied  SI and he rated his depression , hopelessness and anxiety " 7/7/7/", respectively. 'A He has had no compliants of nausea and / or vomiting today and NP requested to give order for prn ( zofran)( night shift reproted pt c/od nausea and vomiting last night) and this was done. R Safety is in place.

## 2015-08-03 NOTE — Progress Notes (Signed)
Per Pt request, referral made to [facility] in [place] and [facility]. CSW will follow-up on referrals.  Chad CordialLauren Carter, LCSWA Clinical Social Work [phone]

## 2015-08-04 LAB — HEMOGLOBIN A1C
Hgb A1c MFr Bld: 11.2 % — ABNORMAL HIGH (ref 4.8–5.6)
MEAN PLASMA GLUCOSE: 275 mg/dL

## 2015-08-04 LAB — GLUCOSE, CAPILLARY
GLUCOSE-CAPILLARY: 211 mg/dL — AB (ref 65–99)
GLUCOSE-CAPILLARY: 165 mg/dL — AB (ref 65–99)
Glucose-Capillary: 187 mg/dL — ABNORMAL HIGH (ref 65–99)
Glucose-Capillary: 148 mg/dL — ABNORMAL HIGH (ref 65–99)

## 2015-08-04 MED ORDER — LAMOTRIGINE 25 MG PO TABS
25.0000 mg | ORAL_TABLET | Freq: Once | ORAL | Status: AC
  Administered 2015-08-04: 25 mg via ORAL
  Filled 2015-08-04: qty 1

## 2015-08-04 MED ORDER — LAMOTRIGINE 25 MG PO TABS
50.0000 mg | ORAL_TABLET | Freq: Every day | ORAL | Status: DC
  Administered 2015-08-05 – 2015-08-09 (×5): 50 mg via ORAL
  Filled 2015-08-04 (×7): qty 2

## 2015-08-04 NOTE — Progress Notes (Signed)
[facility] MD Progress Note  08/04/2015 3:33 PM [name]  MRN:  [id number] Subjective:  Patient reports that his nausea is better now.  He is still experiencing a lot of "ups and downs."  Objective: 55 year old AA male. Known to our unit from prior psychiatric admissions- history of Schizoaffective Disorder, as well as cocaine, cannabis use disorder, who presented with worsening sx of mood lability , stating that one of his sisters and an aunt passed away within a period of two weeks , in December.  Patient seen and chart reviewed.Discussed patient with treatment team.I have also reviewed notes in EHR per Dr.Cobos. Pt this AM reports concerns of nausea as well as vomiting. Pt reports he threw up last night and also continues feel nauseous . Pt appears irritable, but cooperative . Pt reports sleep issues last night , reports he could not sleep because of his GI issues. Pt denies any AH/VH - reports they have improved. Pt reports he does not want to be on the Abilify anymore. It was discussed with pt that his QT on EKG is prolonged and that we have to repeat EKG to monitor. Pt agress with plan , but reports he does not want to be on Abilify anymore.   Per staff - pt with nausea and vomiting, currently is calm. Pt denies any other concerns.VS wnl.     Principal Problem: Schizoaffective disorder, bipolar type (HCC) Diagnosis:   Patient Active Problem List   Diagnosis Date Noted  . Cocaine use disorder, moderate, dependence (HCC) [F14.20] 08/03/2015  . Nausea with vomiting [R11.2] 08/03/2015  . Cannabis use disorder, moderate, dependence (HCC) [F12.20] 08/03/2015  . Schizoaffective disorder, bipolar type (HCC) [F25.0] 08/01/2015  . Hyperlipidemia [E78.5] 01/18/2015  . Hyperprolactinemia (HCC) [E22.1] 01/18/2015  . Diabetes mellitus (HCC) [E11.9] 03/11/2012  . HTN (hypertension) [I10] 03/11/2012   Total Time spent with patient: 30 minutes  Past Psychiatric History:Per EHR " Has been diagnosed  with Schizoaffective Disorder, Several prior psychiatric admissions- has attempted suicide in the past. Last admission was June /16 for depression , suicidal ideations, also in relation to death of loved ones . At that time was discharged on Abilify, 5 mgrs BID, and on Lamictal 50 mgrs QDAY . Has not been following recently as he relocated to [place]. States he has been taking Abilify fairly regularly, but not other medications . Denies history of violence. "  Past Medical History:  Past Medical History  Diagnosis Date  . Diabetes mellitus   . Bipolar affective disorder (HCC)   . Schizophrenia, schizo-affective (HCC)   . Hypertension   . Diabetes mellitus (HCC) 03/11/2012  . Bipolar disorder (HCC) 03/11/2012  . Anxiety   . Depression   . 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 Finger surgery    . Wisdom tooth extraction     Family History:  Family History  Problem Relation Age of Onset  . Diabetes Mother   . Bipolar disorder Sister    Family Psychiatric  History: Pt denies hx of mental illness, substance abuse or suicide in family. Social History: Patient is divorced, is on SSD, lives with mother. History  Alcohol Use No     History  Drug Use  . Yes  . Special: Marijuana, Cocaine    Comment: 4 days ago last THC and cocoaine use... relapsed after 2 yrs     Social History   Social History  . Marital Status:  Single    Spouse Name: N/A  . Number of Children: N/A  . Years of Education: N/A   Social History Main Topics  . Smoking status: Never Smoker   . Smokeless tobacco: Never Used  . Alcohol Use: No  . Drug Use: Yes    Special: Marijuana, Cocaine     Comment: 4 days ago last THC and cocoaine use... relapsed after 2 yrs   . Sexual Activity: Yes    Birth Control/ Protection: Condom     Comment: marijuana 25 days ago   Other Topics Concern  . None   Social History Narrative   Additional Social History:    Pain  Medications: Pt denies Prescriptions: Abilify Over the Counter: Pt denies Longest period of sobriety (when/how long): unknown Withdrawal Symptoms: Agitation, Aggressive/Assaultive, Irritability, Sweats Name of Substance 1: marijuana 1 - Age of First Use: unknown 1 - Amount (size/oz): 3 to 4 "blounts" per day 1 - Frequency: per day 1 - Duration: ongoing 1 - Last Use / Amount: 07/25/2015 Name of Substance 2: cocaine 2 - Age of First Use: unknown 2 - Amount (size/oz): unknkown 2 - Frequency: rarely 2 - Duration: unknown 2 - Last Use / Amount: 07/25/15                Sleep: Poor  Appetite:  Fair  Current Medications: Current Facility-Administered Medications  Medication Dose Route Frequency Provider Last Rate Last Dose  . acetaminophen (TYLENOL) tablet 650 mg  650 mg Oral Q6H PRN Kerry HoughSpencer E Simon, PA-C      . alum & mag hydroxide-simeth (MAALOX/MYLANTA) 200-200-20 MG/5ML suspension 30 mL  30 mL Oral Q4H PRN Kerry HoughSpencer E Simon, PA-C      . glipiZIDE (GLUCOTROL) tablet 10 mg  10 mg Oral BID AC Kerry HoughSpencer E Simon, PA-C   10 mg at 08/04/15 [id number]  . hydrOXYzine (ATARAX/VISTARIL) tablet 25 mg  25 mg Oral Q6H PRN Kerry HoughSpencer E Simon, PA-C      . insulin aspart (novoLOG) injection 0-20 Units  0-20 Units Subcutaneous TID WC Kerry HoughSpencer E Simon, PA-C   7 Units at 08/04/15 1215  . insulin aspart (novoLOG) injection 0-5 Units  0-5 Units Subcutaneous QHS Kerry HoughSpencer E Simon, PA-C   0 Units at 08/01/15 2315  . insulin detemir (LEVEMIR) injection 40 Units  40 Units Subcutaneous QHS Zadie Rhineonald Wickline, MD   40 Units at 08/03/15 2215  . lamoTRIgine (LAMICTAL) tablet 25 mg  25 mg Oral QHS Jomarie LongsSaramma Eappen, MD   25 mg at 08/03/15 2214  . magnesium hydroxide (MILK OF MAGNESIA) suspension 30 mL  30 mL Oral Daily PRN Kerry HoughSpencer E Simon, PA-C      . metFORMIN (GLUCOPHAGE) tablet 1,000 mg  1,000 mg Oral BID WC Kerry HoughSpencer E Simon, PA-C   1,000 mg at 08/04/15 [id number]  . ondansetron (ZOFRAN-ODT) disintegrating tablet 8 mg  8 mg Oral Q8H  PRN Jomarie LongsSaramma Eappen, MD      . sertraline (ZOLOFT) tablet 25 mg  25 mg Oral Daily Jomarie LongsSaramma Eappen, MD   25 mg at 08/04/15 0823  . simvastatin (ZOCOR) tablet 20 mg  20 mg Oral q1800 Saramma Eappen, MD      . zolpidem (AMBIEN) tablet 5 mg  5 mg Oral QHS Jomarie LongsSaramma Eappen, MD   5 mg at 08/03/15 2214    Lab Results:  Results for orders placed or performed during the hospital encounter of 08/01/15 (from the past 48 hour(s))  Glucose, capillary     Status: Abnormal   Collection  Time: 08/02/15  4:28 PM  Result Value Ref Range   Glucose-Capillary 193 (H) 65 - 99 mg/dL   Comment 1 Notify RN    Comment 2 Document in Chart   Glucose, capillary     Status: Abnormal   Collection Time: 08/02/15  8:37 PM  Result Value Ref Range   Glucose-Capillary 155 (H) 65 - 99 mg/dL  Glucose, capillary     Status: Abnormal   Collection Time: 08/03/15  6:11 AM  Result Value Ref Range   Glucose-Capillary 220 (H) 65 - 99 mg/dL   Comment 1 Notify RN   Lipid panel     Status: Abnormal   Collection Time: 08/03/15  6:54 AM  Result Value Ref Range   Cholesterol 261 (H) 0 - 200 mg/dL   Triglycerides 409 (H) <150 mg/dL   HDL 46 >81 mg/dL   Total CHOL/HDL Ratio 5.7 RATIO   VLDL 32 0 - 40 mg/dL   LDL Cholesterol 191 (H) 0 - 99 mg/dL    Comment:        Total Cholesterol/HDL:CHD Risk Coronary Heart Disease Risk Table                     Men   Women  1/2 Average Risk   3.4   3.3  Average Risk       5.0   4.4  2 X Average Risk   9.6   7.1  3 X Average Risk  23.4   11.0        Use the calculated Patient Ratio above and the CHD Risk Table to determine the patient's CHD Risk.        ATP III CLASSIFICATION (LDL):  <100     mg/dL   Optimal  478-295  mg/dL   Near or Above                    Optimal  130-159  mg/dL   Borderline  621-308  mg/dL   High  >657     mg/dL   Very High Performed at [facility]   Hemoglobin A1c     Status: Abnormal   Collection Time: 08/03/15  6:54 AM  Result Value Ref Range   Hgb  A1c MFr Bld 11.2 (H) 4.8 - 5.6 %    Comment: (NOTE)         Pre-diabetes: 5.7 - 6.4         Diabetes: >6.4         Glycemic control for adults with diabetes: <7.0    Mean Plasma Glucose 275 mg/dL    Comment: (NOTE) Performed At: [facility] [address] [place], [place] [id number] Mila Homer MD WU:[id number] Performed at [facility]   Glucose, capillary     Status: Abnormal   Collection Time: 08/03/15  9:02 PM  Result Value Ref Range   Glucose-Capillary 183 (H) 65 - 99 mg/dL  Glucose, capillary     Status: Abnormal   Collection Time: 08/04/15  6:21 AM  Result Value Ref Range   Glucose-Capillary 187 (H) 65 - 99 mg/dL  Glucose, capillary     Status: Abnormal   Collection Time: 08/04/15 11:59 AM  Result Value Ref Range   Glucose-Capillary 211 (H) 65 - 99 mg/dL   Comment 1 Notify RN    Comment 2 Document in Chart     Physical Findings: AIMS:  , ,  ,  ,  CIWA:  CIWA-Ar Total: 14 COWS:  COWS Total Score: 4  Musculoskeletal: Strength & Muscle Tone: within normal limits Gait & Station: normal Patient leans: N/A  Psychiatric Specialty Exam: Review of Systems  Psychiatric/Behavioral: Positive for depression and substance abuse. The patient has insomnia.   All other systems reviewed and are negative.   Blood pressure 127/82, pulse 77, temperature 98.3 F (36.8 C), temperature source Oral, resp. rate 20, height 5\' 9"  (1.753 m), weight 102.059 kg (225 lb), SpO2 100 %.Body mass index is 33.21 kg/(m^2).  General Appearance: Casual  Eye Contact::  Minimal  Speech:  Normal Rate  Volume:  Increased  Mood:  Irritable  Affect:  Congruent  Thought Process:  Goal Directed  Orientation:  Full (Time, Place, and Person)  Thought Content:  Rumination  Suicidal Thoughts:  No  Homicidal Thoughts:  No  Memory:  Immediate;   Fair Recent;   Fair Remote;   Fair  Judgement:  Impaired  Insight:  Shallow  Psychomotor Activity:  Decreased   Concentration:  Poor  Recall:  [employer] of Knowledge:Fair  Language: Fair  Akathisia:  No    AIMS (if indicated):     Assets:  Desire for Improvement  ADL's:  Intact  Cognition: WNL  Sleep:  Number of Hours: 6.75   Treatment Plan Summary:Patient presented with worsening mood sx. Pt was started on Zoloft and Abilify per Dr.Cobbos. However pt developed Nausea and vomiting , ? 2/2 zoloft ( he received first dose last PM). Pt also with Prolonged QT. Pt is on Abilify. Pt reports he does not want to be on Abilify. Hence will DC Abilify. Will repeat EKG for QT prolongation.   Daily contact with patient to assess and evaluate symptoms and progress in treatment and Medication management   Reviewed past medical records,treatment plan.   Will reduce Zoloft to 25 mg.  Will increase Lamictal to 50 mg po qhs for mood lability.  Will add Ambien 5 mg po qhs for sleep tonight- however after repeat EKG and if his GI issues improves - his sleep medication can be reassessed and he could be started on another one like Trazodone, remeron or TCA's like Doxepin ( given he has no past hx of seizures or cardiac issues )   Will provide Zofran 8 mg po prn for GI sx.  Will continue to monitor vitals ,medication compliance and treatment side effects while patient is here.  Will monitor for medical issues as well as call consult as needed.   Reviewed labs ,lipid panel abnormal - pt with hx of hyperlipidemia, will restart Zocor 20 mg po qpm - start Saturday.Dietician consult.  CSW will start working on disposition.  Patient to participate in therapeutic milieu .   Velna Hatchet May Agustin AGNP-BC 08/04/2015, 3:33 PM  Reviewed the information documented and agree with the treatment plan.  Rahm Minix,JANARDHAHA R. 08/05/2015 11:42 AM

## 2015-08-04 NOTE — Progress Notes (Signed)
D Regi has been reserved, sullen and guarded all shift. HE sits in the dayroom..not talking to anybody..staring straight ahead. HE speaks in [employer] group  Today about being " pissed off" and relates it to feelings he has ( regarding his discharge plans) ...that he feels angry and frustrated because somebody "told me they were going to do something and they didn't ". He completes his daily assessment and on it he wrote he "sometimes" has SI today ( but he contracts with this nurse to stay safe) and he rates his depression, hopelessness and anxiety " 8/8/8", respectively.    A He attended his [employer] group this morning.    R POC contd.

## 2015-08-04 NOTE — Progress Notes (Signed)
[facility] MD Progress Note  08/04/2015 10:40 PM [name]  MRN:  [id number] Subjective:  Patient reports that his nausea is better now.  He is still experiencing a lot of "ups and downs."  He does states he has been having diarrhea.  Objective: 55 year old AA male. Known to our unit from prior psychiatric admissions- history of Schizoaffective Disorder, as well as cocaine, cannabis use disorder, who presented with worsening sx of mood lability , stating that one of his sisters and an aunt passed away within a period of two weeks , in December. Pt appears irritable, but cooperative .  Principal Problem: Schizoaffective disorder, bipolar type (HCC) Diagnosis:   Patient Active Problem List   Diagnosis Date Noted  . Cocaine use disorder, moderate, dependence (HCC) [F14.20] 08/03/2015  . Nausea with vomiting [R11.2] 08/03/2015  . Cannabis use disorder, moderate, dependence (HCC) [F12.20] 08/03/2015  . Schizoaffective disorder, bipolar type (HCC) [F25.0] 08/01/2015  . Hyperlipidemia [E78.5] 01/18/2015  . Hyperprolactinemia (HCC) [E22.1] 01/18/2015  . Diabetes mellitus (HCC) [E11.9] 03/11/2012  . HTN (hypertension) [I10] 03/11/2012   Total Time spent with patient: 30 minutes  Past Psychiatric History:Per EHR " Has been diagnosed with Schizoaffective Disorder, Several prior psychiatric admissions- has attempted suicide in the past. Last admission was June /16 for depression , suicidal ideations, also in relation to death of loved ones . At that time was discharged on Abilify, 5 mgrs BID, and on Lamictal 50 mgrs QDAY . Has not been following recently as he relocated to [place]. States he has been taking Abilify fairly regularly, but not other medications . Denies history of violence. "  Past Medical History:  Past Medical History  Diagnosis Date  . Diabetes mellitus   . Bipolar affective disorder (HCC)   . Schizophrenia, schizo-affective (HCC)   . Hypertension   . Diabetes mellitus (HCC)  03/11/2012  . Bipolar disorder (HCC) 03/11/2012  . Anxiety   . Depression   . 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 Finger surgery    . Wisdom tooth extraction     Family History:  Family History  Problem Relation Age of Onset  . Diabetes Mother   . Bipolar disorder Sister    Family Psychiatric  History: Pt denies hx of mental illness, substance abuse or suicide in family. Social History: Patient is divorced, is on SSD, lives with mother. History  Alcohol Use No     History  Drug Use  . Yes  . Special: Marijuana, Cocaine    Comment: 4 days ago last THC and cocoaine use... relapsed after 2 yrs     Social History   Social History  . Marital Status: Single    Spouse Name: N/A  . Number of Children: N/A  . Years of Education: N/A   Social History Main Topics  . Smoking status: Never Smoker   . Smokeless tobacco: Never Used  . Alcohol Use: No  . Drug Use: Yes    Special: Marijuana, Cocaine     Comment: 4 days ago last THC and cocoaine use... relapsed after 2 yrs   . Sexual Activity: Yes    Birth Control/ Protection: Condom     Comment: marijuana 25 days ago   Other Topics Concern  . None   Social History Narrative   Additional Social History:    Pain Medications: Pt denies Prescriptions: Abilify Over the Counter: Pt denies Longest period of sobriety (when/how  long): unknown Withdrawal Symptoms: Agitation, Aggressive/Assaultive, Irritability, Sweats Name of Substance 1: marijuana 1 - Age of First Use: unknown 1 - Amount (size/oz): 3 to 4 "blounts" per day 1 - Frequency: per day 1 - Duration: ongoing 1 - Last Use / Amount: 07/25/2015 Name of Substance 2: cocaine 2 - Age of First Use: unknown 2 - Amount (size/oz): unknkown 2 - Frequency: rarely 2 - Duration: unknown 2 - Last Use / Amount: 07/25/15    Sleep: Poor  Appetite:  Fair  Current Medications: Current Facility-Administered Medications   Medication Dose Route Frequency Provider Last Rate Last Dose  . acetaminophen (TYLENOL) tablet 650 mg  650 mg Oral Q6H PRN Kerry HoughSpencer E Simon, PA-C      . alum & mag hydroxide-simeth (MAALOX/MYLANTA) 200-200-20 MG/5ML suspension 30 mL  30 mL Oral Q4H PRN Kerry HoughSpencer E Simon, PA-C      . glipiZIDE (GLUCOTROL) tablet 10 mg  10 mg Oral BID AC Kerry HoughSpencer E Simon, PA-C   10 mg at 08/04/15 1739  . hydrOXYzine (ATARAX/VISTARIL) tablet 25 mg  25 mg Oral Q6H PRN Kerry HoughSpencer E Simon, PA-C      . insulin aspart (novoLOG) injection 0-20 Units  0-20 Units Subcutaneous TID WC Kerry HoughSpencer E Simon, PA-C   4 Units at 08/04/15 1738  . insulin aspart (novoLOG) injection 0-5 Units  0-5 Units Subcutaneous QHS Kerry HoughSpencer E Simon, PA-C   0 Units at 08/01/15 2315  . insulin detemir (LEVEMIR) injection 40 Units  40 Units Subcutaneous QHS Zadie Rhineonald Wickline, MD   40 Units at 08/04/15 2124  . lamoTRIgine (LAMICTAL) tablet 25 mg  25 mg Oral Once Adonis BrookSheila Agustin, NP      . Melene Muller[START ON 08/05/2015] lamoTRIgine (LAMICTAL) tablet 50 mg  50 mg Oral QHS Adonis BrookSheila Agustin, NP      . magnesium hydroxide (MILK OF MAGNESIA) suspension 30 mL  30 mL Oral Daily PRN Kerry HoughSpencer E Simon, PA-C      . metFORMIN (GLUCOPHAGE) tablet 1,000 mg  1,000 mg Oral BID WC Kerry HoughSpencer E Simon, PA-C   1,000 mg at 08/04/15 1739  . ondansetron (ZOFRAN-ODT) disintegrating tablet 8 mg  8 mg Oral Q8H PRN Jomarie LongsSaramma Eappen, MD      . sertraline (ZOLOFT) tablet 25 mg  25 mg Oral Daily Adonis BrookSheila Agustin, NP   25 mg at 08/04/15 0823  . simvastatin (ZOCOR) tablet 20 mg  20 mg Oral q1800 Jomarie LongsSaramma Eappen, MD   20 mg at 08/04/15 1739  . zolpidem (AMBIEN) tablet 5 mg  5 mg Oral QHS Jomarie LongsSaramma Eappen, MD   5 mg at 08/04/15 2124    Lab Results:  Results for orders placed or performed during the hospital encounter of 08/01/15 (from the past 48 hour(s))  Glucose, capillary     Status: Abnormal   Collection Time: 08/03/15  6:11 AM  Result Value Ref Range   Glucose-Capillary 220 (H) 65 - 99 mg/dL   Comment 1 Notify  RN   Lipid panel     Status: Abnormal   Collection Time: 08/03/15  6:54 AM  Result Value Ref Range   Cholesterol 261 (H) 0 - 200 mg/dL   Triglycerides 696162 (H) <150 mg/dL   HDL 46 >29>40 mg/dL   Total CHOL/HDL Ratio 5.7 RATIO   VLDL 32 0 - 40 mg/dL   LDL Cholesterol 528183 (H) 0 - 99 mg/dL    Comment:        Total Cholesterol/HDL:CHD Risk Coronary Heart Disease Risk Table  Men   Women  1/2 Average Risk   3.4   3.3  Average Risk       5.0   4.4  2 X Average Risk   9.6   7.1  3 X Average Risk  23.4   11.0        Use the calculated Patient Ratio above and the CHD Risk Table to determine the patient's CHD Risk.        ATP III CLASSIFICATION (LDL):  <100     mg/dL   Optimal  161-096  mg/dL   Near or Above                    Optimal  130-159  mg/dL   Borderline  045-409  mg/dL   High  >811     mg/dL   Very High Performed at [facility]   Hemoglobin A1c     Status: Abnormal   Collection Time: 08/03/15  6:54 AM  Result Value Ref Range   Hgb A1c MFr Bld 11.2 (H) 4.8 - 5.6 %    Comment: (NOTE)         Pre-diabetes: 5.7 - 6.4         Diabetes: >6.4         Glycemic control for adults with diabetes: <7.0    Mean Plasma Glucose 275 mg/dL    Comment: (NOTE) Performed At: [facility] [address] [place], [place] [id number] Mila Homer MD OZ:[id number] Performed at [facility]   Glucose, capillary     Status: Abnormal   Collection Time: 08/03/15  9:02 PM  Result Value Ref Range   Glucose-Capillary 183 (H) 65 - 99 mg/dL  Glucose, capillary     Status: Abnormal   Collection Time: 08/04/15  6:21 AM  Result Value Ref Range   Glucose-Capillary 187 (H) 65 - 99 mg/dL  Glucose, capillary     Status: Abnormal   Collection Time: 08/04/15 11:59 AM  Result Value Ref Range   Glucose-Capillary 211 (H) 65 - 99 mg/dL   Comment 1 Notify RN    Comment 2 Document in Chart   Glucose, capillary     Status: Abnormal   Collection Time:  08/04/15  5:09 PM  Result Value Ref Range   Glucose-Capillary 165 (H) 65 - 99 mg/dL   Comment 1 Notify RN    Comment 2 Document in Chart   Glucose, capillary     Status: Abnormal   Collection Time: 08/04/15  9:10 PM  Result Value Ref Range   Glucose-Capillary 148 (H) 65 - 99 mg/dL   Comment 1 Notify RN     Physical Findings: AIMS:  , ,  ,  ,    CIWA:  CIWA-Ar Total: 14 COWS:  COWS Total Score: 4  Musculoskeletal: Strength & Muscle Tone: within normal limits Gait & Station: normal Patient leans: N/A  Psychiatric Specialty Exam: Review of Systems  Psychiatric/Behavioral: Positive for depression. The patient has insomnia.   All other systems reviewed and are negative.   Blood pressure 127/82, pulse 77, temperature 98.3 F (36.8 C), temperature source Oral, resp. rate 20, height 5\' 9"  (1.753 m), weight 102.059 kg (225 lb), SpO2 100 %.Body mass index is 33.21 kg/(m^2).  General Appearance: Casual  Eye Contact::  Minimal  Speech:  Normal Rate  Volume:  Increased  Mood:  Irritable  Affect:  Congruent  Thought Process:  Goal Directed  Orientation:  Full (Time, Place, and  Person)  Thought Content:  Rumination  Suicidal Thoughts:  No  Homicidal Thoughts:  No  Memory:  Immediate;   Fair Recent;   Fair Remote;   Fair  Judgement:  Impaired  Insight:  Shallow  Psychomotor Activity:  Decreased  Concentration:  Poor  Recall:  [employer] of Knowledge:Fair  Language: Fair  Akathisia:  No    AIMS (if indicated):     Assets:  Desire for Improvement  ADL's:  Intact  Cognition: WNL  Sleep:  Number of Hours: 6.75   Treatment Plan Summary:  Patient presented with worsening mood.  He states that he feel his mood has improved but does get irritable.  Denies further Nausea and vomiting  Daily contact with patient to assess and evaluate symptoms and progress in treatment and Medication management   Reviewed past medical records,treatment plan.   Cont Zoloft to 25 mg.  Cont  Lamictal to 50 mg po qhs for mood lability.  Cont Ambien 5 mg po qhs for sleep tonight Continue Zofran 8 mg po prn for GI sx.  Will continue to monitor vitals ,medication compliance and treatment side effects while patient is here.  Will monitor for medical issues as well as call consult as needed.   Cont.  Zocor 20 mg po qpm -Dietician consult.  CSW will start working on disposition.  Patient to participate in therapeutic milieu .   Velna Hatchet May Agustin AGNP-BC 08/04/2015, 10:40 PM  Reviewed the information documented and agree with the treatment plan.  Janai Brannigan,JANARDHAHA R. 08/09/2015 3:01 PM

## 2015-08-04 NOTE — Progress Notes (Signed)
Pt did not attend wrap up group meeting.  

## 2015-08-04 NOTE — Progress Notes (Signed)
   D: Pt was noticeably  irritated and short with the Clinical research associatewriter. Answered some questions before the writer could ask the entire question. Pt refused to allow the writer to do an EKG. Pt has no other questions or concerns.    A:  Support and encouragement was offered. 15 min checks continued for safety.  R: Pt remains safe.

## 2015-08-04 NOTE — [facility] Group Notes (Signed)
Adult Therapy Group Note  Date:  08/04/2015 Time:  1:45-2:45 PM  Group Topic/Focus:  Managing Feelings:   The focus of this group was to identify what feelings patients have difficulty handling and develop a plan to handle them in a healthier way upon discharge.  Focus was on anger outbursts and an Anger Evaluation tool was used to elicit thought and motivate toward change.  Participation Level:  Minimal  Participation Quality:  Drowsy and Inattentive  Affect:  Blunted  Cognitive:  Inattentive, not able to assess  Insight: Limited  Engagement in Group:  Limited  Modes of Intervention:  Discussion and Motivational Interviewing  Additional Comments:  [name] appeared to be disconnected and inattentive throughout group, bored and irritable at times.   Sarina SerGrossman-Orr, Melville Engen Jo 08/04/2015, 3:46 PM

## 2015-08-05 LAB — GLUCOSE, CAPILLARY
GLUCOSE-CAPILLARY: 145 mg/dL — AB (ref 65–99)
GLUCOSE-CAPILLARY: 94 mg/dL (ref 65–99)
GLUCOSE-CAPILLARY: 166 mg/dL — AB (ref 65–99)
GLUCOSE-CAPILLARY: 118 mg/dL — AB (ref 65–99)
Glucose-Capillary: 206 mg/dL — ABNORMAL HIGH (ref 65–99)
Glucose-Capillary: 166 mg/dL — ABNORMAL HIGH (ref 65–99)

## 2015-08-05 LAB — GLUCOSE, POCT (MANUAL RESULT ENTRY): POC GLUCOSE: 184 mg/dL — AB (ref 70–99)

## 2015-08-05 MED ORDER — LOPERAMIDE HCL 2 MG PO CAPS
ORAL_CAPSULE | ORAL | Status: AC
  Administered 2015-08-05: 2 mg
  Filled 2015-08-05: qty 1

## 2015-08-05 NOTE — Congregational Nurse Program (Signed)
Congregational Nurse Program Note  Date of Encounter: 07/24/2015  Past Medical History: Past Medical History  Diagnosis Date  . Diabetes mellitus   . Bipolar affective disorder (HCC)   . Schizophrenia, schizo-affective (HCC)   . Hypertension   . Diabetes mellitus (HCC) 03/11/2012  . Bipolar disorder (HCC) 03/11/2012  . Anxiety   . Depression   . Hepatitis 06/30/2013    Type C    Encounter Details:     CNP Questionnaire - 07/24/15 1542    Patient Demographics   Is this a new or existing patient? Existing   Patient is considered a/an Not Applicable   Patient Assistance   Patient referred to apply for the following financial assistance [employer]   Food insecurities addressed Provided food supplies   Transportation assistance No   Assistance securing medications No   Educational health offerings Diabetes;Nutrition   Encounter Details   Primary purpose of visit Chronic Illness/Condition Visit   Was an Emergency Department visit averted? Not Applicable   Does patient have a medical provider? Yes   Patient referred to Clinic   Was a mental health screening completed? (GAINS tool) No   Does patient have dental issues? No   Since previous encounter, have you referred patient for abnormal blood pressure that resulted in a new diagnosis or medication change? No   Since previous encounter, have you referred patient for abnormal blood glucose that resulted in a new diagnosis or medication change? No   For Abstraction Use Only   Does patient have insurance? No       Requesting assistance with obtaining a prescription for his Novolog.  Has Medicaid.  [facility]

## 2015-08-05 NOTE — Congregational Nurse Program (Signed)
Congregational Nurse Program Note  Date of Encounter: 07/26/2015  Past Medical History: Past Medical History  Diagnosis Date  . Diabetes mellitus   . Bipolar affective disorder (HCC)   . Schizophrenia, schizo-affective (HCC)   . Hypertension   . Diabetes mellitus (HCC) 03/11/2012  . Bipolar disorder (HCC) 03/11/2012  . Anxiety   . Depression   . Hepatitis 06/30/2013    Type C    Encounter Details:     CNP Questionnaire - 08/05/15 1544    Patient Demographics   Race African-American/Black   Patient Assistance   Location of Patient Assistance Not Applicable   Patient's financial/insurance status Low Income;Medicaid   Uninsured Patient No       Clinic note:  CBG check.  Client still doe not have appointment with provider for insulin refill.  Encouraged him to follow through.  Will monitor CBG

## 2015-08-05 NOTE — [facility] Group Notes (Signed)
[facility] Group Notes:  (Clinical Social Work)   08/05/2015 1:15-2:15PM  Summary of Progress/Problems:   The main focus of today's process group was to   1)  discuss the importance of adding supports  2)  define health supports versus unhealthy supports  3)  identify the patient's current unhealthy supports and plan how to handle them  4)  Identify the patient's current healthy supports and plan what to add.  An emphasis was placed on using counselor, doctor, therapy groups, 12-step groups, and problem-specific support groups to expand supports.    The patient expressed full comprehension of the concepts presented, and agreed that there is a need to add more supports.  The patient stated his family is a positive support and he himself is unhealthy as a support.  He talked about all the times he has tried to remain healthy, and when he was most successful for over 6 years he was not isolating himself.  He participated more today and was less irritable.  Type of Therapy:  Process Group with Motivational Interviewing  Participation Level:  Active  Participation Quality:  Attentive and Sharing  Affect:  Blunted  Cognitive:  Appropriate and Oriented  Insight:  Engaged  Engagement in Therapy:  Engaged  Modes of Intervention:   Education, Support and Processing, Activity  Ambrose MantleMareida Grossman-Orr, LCSW 08/05/2015   4:32 PM

## 2015-08-05 NOTE — Plan of Care (Signed)
Problem: Consults Goal: Suicide Risk Patient Education (See Patient Education module for education specifics)  Outcome: Progressing Nurse discussed depression/suicidal thoughts/coping skills with patient.     

## 2015-08-05 NOTE — Progress Notes (Signed)
Psychoeducational Group Note  Date:  08/05/2015 Time: 2208  Group Topic/Focus:  Wrap-Up Group:   The focus of this group is to help patients review their daily goal of treatment and discuss progress on daily workbooks.  Participation Level: Did Not Attend  Participation Quality:  Not Applicable  Affect:  Not Applicable  Cognitive:  Not Applicable  Insight:  Not Applicable  Engagement in Group: Not Applicable  Additional Comments:  The patient did not attend group this evening.  Chord Takahashi S 08/05/2015, 10:08 PM

## 2015-08-05 NOTE — Progress Notes (Signed)
D:  Patient stated he does have SI thoughts, contracts for safety, no plan.  Denied HI.  Denied visual hallucinations.  Does hear voices to hurt himself.   A:  Medications administered per MD orders.  Emotional support and encouragement given patient. R:  Safety maintained with 15 minute checks.

## 2015-08-05 NOTE — Progress Notes (Signed)
Patient has been isolative to his room this shift. He was encouraged to come to medication window to receive his scheduled medications and at first refused to come but later changed his mind. He was blunt, and seemed agitated about something. He was hesitant to answer writers questions but denied having si/hi/a/v hallucinations. He returned to his room after medications.

## 2015-08-05 NOTE — Plan of Care (Signed)
Problem: Alteration in thought process Goal: STG-Patient is able to discuss thoughts with staff Outcome: Not Progressing Patient is not willing to discuss his thoughts with staff and has been isolative to his room. When writer attempted to talk with him he seemed angry.

## 2015-08-05 NOTE — Progress Notes (Signed)
D Regi cont to be emotionally stuck. He is flat, disgruntled, sad and quiet. He completed  His daily assessment and  On it he wrote he has had SI today ( but he easily contracts for safety with this nurse ) and he rated his depression, hopelessness and anxiety " 05/06/09", respectively.   A He attends his groups but chooses not to share feelings with the group.   R Safety is in place and poc cont.

## 2015-08-05 NOTE — [facility] Group Notes (Signed)
[facility] Group Notes:  (Nursing/MHT/Case Management/Adjunct)  Date:  08/05/2015  Time:  1:35 PM  Type of Therapy:  Nurse Education  /  Life Skills : The group is focused on helping patients identify healthy support systems as well as identify how to develop healthy support systems.  Participation Level:  Minimal  Participation Quality:  Appropriate  Affect:  Angry  Cognitive:  Alert  Insight:  Appropriate  Engagement in Group:  Engaged  Modes of Intervention:  Education  Summary of Progress/Problems:  Joseph BraveDuke, [name] 08/05/2015, 1:35 PM

## 2015-08-06 LAB — GLUCOSE, CAPILLARY
GLUCOSE-CAPILLARY: 200 mg/dL — AB (ref 65–99)
GLUCOSE-CAPILLARY: 170 mg/dL — AB (ref 65–99)
Glucose-Capillary: 139 mg/dL — ABNORMAL HIGH (ref 65–99)

## 2015-08-06 MED ORDER — ARIPIPRAZOLE 5 MG PO TABS
5.0000 mg | ORAL_TABLET | Freq: Every day | ORAL | Status: DC
  Administered 2015-08-06 – 2015-08-10 (×5): 5 mg via ORAL
  Filled 2015-08-06 (×7): qty 1

## 2015-08-06 MED ORDER — SERTRALINE HCL 50 MG PO TABS
50.0000 mg | ORAL_TABLET | Freq: Every day | ORAL | Status: DC
  Administered 2015-08-07 – 2015-08-08 (×2): 50 mg via ORAL
  Filled 2015-08-06 (×3): qty 1

## 2015-08-06 NOTE — Plan of Care (Signed)
Problem: Consults Goal: Aggression Patient Education See Patient Education Module for education specifics.  Outcome: Progressing Nurse discussed depression/anxiety/coping skills with patient.

## 2015-08-06 NOTE — Progress Notes (Addendum)
Patient ID: [name], male   DOB: 06/12/1961, 55 y.o.   MRN: [id number] [facility] MD Progress Note  08/06/2015 3:39 PM [name]  MRN:  [id number] Subjective:  Patient reports that there is some improvement compared to how he felt on admission. He states that he had been experiencing nausea over the weekend, now improved/resolved. No vomiting today, and able to eat meals without difficulties . He is unsure what may have caused nausea- he is now off Abilify, but as discussed it is unlikely this medication caused nausea, as he states he had taken it regularly prior to admission with no side effects.  It could be related to Zoloft , which is new medication trial for him.   Objective:  I have discussed case with treatment team and have met with patient. No disruptive or agitated behaviors on unit. Milieu / group participation has been limited, but he is less isolative . Reports partial improvement , but states he still feels depressed. States hallucinations have improved , and at this time does not appear internally preoccupied or paranoid .  He is concerned about possible disposition plans- prior to admission he had been staying at [facility], but states it is unclear  To him if he can return there , and states that calls to [facility] to clarify this have not been successful. He does state he would consider other options , such as going to [facility] or to [facility]. As above, recent increased nausea, but denies vomiting. This now improved/resolved.  Today patient  calm, cooperative , and future oriented, focusing on potential disposition plans. Repeat EKG - no QTc prolongation.      Principal Problem: Schizoaffective disorder, bipolar type (HCC) Diagnosis:   Patient Active Problem List   Diagnosis Date Noted  . Cocaine use disorder, moderate, dependence (HCC) [F14.20] 08/03/2015  . Nausea with vomiting [R11.2] 08/03/2015  . Cannabis use disorder, moderate, dependence (HCC) [F12.20]  08/03/2015  . Schizoaffective disorder, bipolar type (HCC) [F25.0] 08/01/2015  . Hyperlipidemia [E78.5] 01/18/2015  . Hyperprolactinemia (HCC) [E22.1] 01/18/2015  . Diabetes mellitus (HCC) [E11.9] 03/11/2012  . HTN (hypertension) [I10] 03/11/2012   Total Time spent with patient:  20 minutes   Past Psychiatric History:Per EHR " Has been diagnosed with Schizoaffective Disorder, Several prior psychiatric admissions- has attempted suicide in the past. Last admission was June /16 for depression , suicidal ideations, also in relation to death of loved ones . At that time was discharged on Abilify, 5 mgrs BID, and on Lamictal 50 mgrs QDAY . Has not been following recently as he relocated to [place]. States he has been taking Abilify fairly regularly, but not other medications . Denies history of violence. "  Past Medical History:  Past Medical History  Diagnosis Date  . Diabetes mellitus   . Bipolar affective disorder (HCC)   . Schizophrenia, schizo-affective (HCC)   . Hypertension   . Diabetes mellitus (HCC) 03/11/2012  . Bipolar disorder (HCC) 03/11/2012  . Anxiety   . Depression   . 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 Finger surgery    . Wisdom tooth extraction     Family History:  Family History  Problem Relation Age of Onset  . Diabetes Mother   . Bipolar disorder Sister    Family Psychiatric  History: Pt denies hx of mental illness, substance abuse or suicide in family. Social History: Patient is divorced, is on SSD, lives with   mother. History  Alcohol Use No     History  Drug Use  . Yes  . Special: Marijuana, Cocaine    Comment: 4 days ago last THC and cocoaine use... relapsed after 2 yrs     Social History   Social History  . Marital Status: Single    Spouse Name: N/A  . Number of Children: N/A  . Years of Education: N/A   Social History Main Topics  . Smoking status: Never Smoker   . Smokeless tobacco:  Never Used  . Alcohol Use: No  . Drug Use: Yes    Special: Marijuana, Cocaine     Comment: 4 days ago last THC and cocoaine use... relapsed after 2 yrs   . Sexual Activity: Yes    Birth Control/ Protection: Condom     Comment: marijuana 25 days ago   Other Topics Concern  . None   Social History Narrative   Additional Social History:    Pain Medications: Pt denies Prescriptions: Abilify Over the Counter: Pt denies Longest period of sobriety (when/how long): unknown Withdrawal Symptoms: Agitation, Aggressive/Assaultive, Irritability, Sweats Name of Substance 1: marijuana 1 - Age of First Use: unknown 1 - Amount (size/oz): 3 to 4 "blounts" per day 1 - Frequency: per day 1 - Duration: ongoing 1 - Last Use / Amount: 07/25/2015 Name of Substance 2: cocaine 2 - Age of First Use: unknown 2 - Amount (size/oz): unknkown 2 - Frequency: rarely 2 - Duration: unknown 2 - Last Use / Amount: 07/25/15  Sleep: improved  Appetite:  Improved   Current Medications: Current Facility-Administered Medications  Medication Dose Route Frequency Provider Last Rate Last Dose  . acetaminophen (TYLENOL) tablet 650 mg  650 mg Oral Q6H PRN Laverle Hobby, PA-C      . alum & mag hydroxide-simeth (MAALOX/MYLANTA) 200-200-20 MG/5ML suspension 30 mL  30 mL Oral Q4H PRN Laverle Hobby, PA-C      . ARIPiprazole (ABILIFY) tablet 5 mg  5 mg Oral Daily Myer Peer Cobos, MD   5 mg at 08/06/15 1501  . glipiZIDE (GLUCOTROL) tablet 10 mg  10 mg Oral BID AC Laverle Hobby, PA-C   10 mg at 08/06/15 9326  . hydrOXYzine (ATARAX/VISTARIL) tablet 25 mg  25 mg Oral Q6H PRN Laverle Hobby, PA-C      . insulin aspart (novoLOG) injection 0-20 Units  0-20 Units Subcutaneous TID WC Laverle Hobby, PA-C   4 Units at 08/06/15 1215  . insulin aspart (novoLOG) injection 0-5 Units  0-5 Units Subcutaneous QHS Laverle Hobby, PA-C   0 Units at 08/01/15 2315  . insulin detemir (LEVEMIR) injection 40 Units  40 Units  Subcutaneous QHS Ripley Fraise, MD   40 Units at 08/05/15 2153  . lamoTRIgine (LAMICTAL) tablet 50 mg  50 mg Oral QHS Kerrie Buffalo, NP   50 mg at 08/05/15 2154  . magnesium hydroxide (MILK OF MAGNESIA) suspension 30 mL  30 mL Oral Daily PRN Laverle Hobby, PA-C      . metFORMIN (GLUCOPHAGE) tablet 1,000 mg  1,000 mg Oral BID WC Laverle Hobby, PA-C   1,000 mg at 08/06/15 0804  . ondansetron (ZOFRAN-ODT) disintegrating tablet 8 mg  8 mg Oral Q8H PRN Ursula Alert, MD      . Derrill Memo ON 08/07/2015] sertraline (ZOLOFT) tablet 50 mg  50 mg Oral Daily Myer Peer Cobos, MD      . simvastatin (ZOCOR) tablet 20 mg  20 mg Oral q1800 Saramma  Eappen, MD   20 mg at 08/05/15 1718  . zolpidem (AMBIEN) tablet 5 mg  5 mg Oral QHS Ursula Alert, MD   5 mg at 08/05/15 2154    Lab Results:  Results for orders placed or performed during the hospital encounter of 08/01/15 (from the past 48 hour(s))  Glucose, capillary     Status: Abnormal   Collection Time: 08/04/15  5:09 PM  Result Value Ref Range   Glucose-Capillary 165 (H) 65 - 99 mg/dL   Comment 1 Notify RN    Comment 2 Document in Chart   Glucose, capillary     Status: Abnormal   Collection Time: 08/04/15  9:10 PM  Result Value Ref Range   Glucose-Capillary 148 (H) 65 - 99 mg/dL   Comment 1 Notify RN   Glucose, capillary     Status: Abnormal   Collection Time: 08/05/15  6:10 AM  Result Value Ref Range   Glucose-Capillary 166 (H) 65 - 99 mg/dL   Comment 1 Notify RN    Comment 2 Document in Chart   Glucose, capillary     Status: None   Collection Time: 08/05/15 11:59 AM  Result Value Ref Range   Glucose-Capillary 94 65 - 99 mg/dL   Comment 1 Notify RN    Comment 2 Document in Chart   Glucose, capillary     Status: Abnormal   Collection Time: 08/05/15  5:06 PM  Result Value Ref Range   Glucose-Capillary 166 (H) 65 - 99 mg/dL   Comment 1 Notify RN    Comment 2 Document in Chart   Glucose, capillary     Status: Abnormal   Collection Time:  08/05/15  8:45 PM  Result Value Ref Range   Glucose-Capillary 118 (H) 65 - 99 mg/dL  Glucose, capillary     Status: Abnormal   Collection Time: 08/06/15  5:56 AM  Result Value Ref Range   Glucose-Capillary 200 (H) 65 - 99 mg/dL    Physical Findings: AIMS: Facial and Oral Movements Muscles of Facial Expression: None, normal Lips and Perioral Area: None, normal Jaw: None, normal Tongue: None, normal,Extremity Movements Upper (arms, wrists, hands, fingers): None, normal Lower (legs, knees, ankles, toes): None, normal, Trunk Movements Neck, shoulders, hips: None, normal, Overall Severity Severity of abnormal movements (highest score from questions above): None, normal Incapacitation due to abnormal movements: None, normal Patient's awareness of abnormal movements (rate only patient's report): No Awareness, Dental Status Current problems with teeth and/or dentures?: No Does patient usually wear dentures?: No  CIWA:  CIWA-Ar Total: 1 COWS:  COWS Total Score: 1  Musculoskeletal: Strength & Muscle Tone: within normal limits Gait & Station: normal Patient leans: N/A  Psychiatric Specialty Exam: Review of Systems  Psychiatric/Behavioral: Positive for depression and substance abuse. The patient has insomnia.   All other systems reviewed and are negative. nausea, now improved, no vomiting today, no abdominal pain   Blood pressure 116/77, pulse 69, temperature 97.8 F (36.6 C), temperature source Oral, resp. rate 18, height 5' 9" (1.753 m), weight 225 lb (102.059 kg), SpO2 100 %.Body mass index is 33.21 kg/(m^2).  General Appearance: Casual  Eye Contact::   Improved eye contact   Speech:  Normal Rate  Volume:   Normal   Mood:  States mood improving   Affect:  Congruent- today smiles at times appropriately   Thought Process:  Goal Directed  Orientation:  Full (Time, Place, and Person)  Thought Content:  Rumination  Suicidal Thoughts:  No currently  denies plan or intention of hurting  self/suicide   Homicidal Thoughts:  No  Memory:   Recent and remote grossly intact   Judgement:   Fair   Insight:   Fair   Psychomotor Activity:  Normal  Concentration:  Good  Recall:  Good  Fund of Knowledge:Fair  Language: Good  Akathisia:  No    AIMS (if indicated):     Assets:  Desire for Improvement  ADL's:  Intact  Cognition: WNL  Sleep:  Number of Hours: 6.25   Treatment Plan Summary:Patient has endorsed ongoing severe depression, anxiety, but today he reports partial improvement , and he does present with overall improvement compared to admission- improved eye contact, improved affective reactivity. Developed nausea , but states this now improving. At this time does not endorse vomiting, anorexia, there is no fever,and appears calm and comfortable.  Abilify was stopped, but patient states it seemed to be working, and it is not likely that Abilify caused nausea, as he has been on this medication in the past without side effects. ( Also , QTc now improved/ WNL- initial EKG had reported a slightly elevated QTc , which may have been related to tricyclic management . ) GI symptoms , now improved, may have been related to Zoloft trial. We discussed and patient wants to continue this trial at this time.    Daily contact with patient to assess and evaluate symptoms and progress in treatment and Medication management  Encourage milileu , group participation to work on coping skills and work on symptom reduction.  We discussed options and he agrees to  Increase Zoloft to 50 mgrs QDAY for depression and anxiety- patient aware Zoloft  Can be associated with GI side effects but as noted, GI symptoms currently improved. Will monitor for side effects. Continue  Lamictal 50 mg po qhs for mood lability. Restart Abilify 5 mgrs QDAYR for mood disorder, psychosis Decrease Ambien to 5 mgrs QHS PRN for insomnia as needed  Treatment team working on disposition planning . As noted, patient expressing  interest in going to [facility] or [facility], particularly if unable to return to [facility]. CSW working on these options Patient to participate in therapeutic milieu .    Neita Garnet 08/06/2015 3:39 PM

## 2015-08-06 NOTE — [facility] Group Notes (Signed)
[facility] LCSW Group Therapy  08/06/2015 1:15pm  Type of Therapy:  Group Therapy vercoming Obstacles  Participation Level:  Minimal  Participation Quality:  Irritable  Affect:  Flat  Cognitive:  Appropriate and Oriented  Insight:  Limited  Engagement in Therapy:  Minimal  Modes of Intervention:  Discussion, Exploration, Problem-solving and Support  Description of Group:   In this group patients will be encouraged to explore what they see as obstacles to their own wellness and recovery. They will be guided to discuss their thoughts, feelings, and behaviors related to these obstacles. The group will process together ways to cope with barriers, with attention given to specific choices patients can make. Each patient will be challenged to identify changes they are motivated to make in order to overcome their obstacles. This group will be process-oriented, with patients participating in exploration of their own experiences as well as giving and receiving support and challenge from other group members.  Summary of Patient Progress: Pt participated minimally in group discussion; however he began to participate towards the end of group. Pt ruminates on his lack of admission to another facility and the homeless shelter giving his bed away. Pt avoids taking responsibility for actions and blames others. Pt is difficult to redirect.   Therapeutic Modalities:   Cognitive Behavioral Therapy Solution Focused Therapy Motivational Interviewing Relapse Prevention Therapy   Chad CordialLauren Carter, LCSWA 08/06/2015 2:24 PM

## 2015-08-06 NOTE — Progress Notes (Signed)
D:  Patient's self inventory sheet, patient has poor sleep, sleep medication is not helpful.  Poor appetite, low energy level, poor concentration.  Rated depression, hopeless and anxiety #10.  Denied withdrawals.  SI, no plan, contracts for safety.  Denied HI.  Denied visual hallucinations.  Does hear voices to harm himself.   Has experienced physical problems, headaches, worst pain in past 24 hours is #8.  Stated he is tired of medications.  Goal is to feel better physically and mentally.  Plans to talk to MD/SW.  Not sure about his discharge plan. A:  Medications administered per MD orders.  Emotional support and encouragement given patient. R:  Denied HI.  Denied visual hallucinations.  SI, no plan, contracts for safety.  Voices tell him to harm himself.

## 2015-08-06 NOTE — [facility] Group Notes (Signed)
[facility]  08/06/2015 8:45 AM  Participation Quality: Alert, Appropriate and Oriented  Mood/Affect: Irritable  Depression Rating: "very"  Anxiety Rating: "very"  Thoughts of Suicide: Pt denies SI/HI  Will you contract for safety? Yes  Current AVH: Pt denies  Plan for Discharge/Comments: Pt attended discharge planning group and actively participated in group. CSW discussed suicide prevention education with the group and encouraged them to discuss discharge planning and any relevant barriers. Pt denies SI/HI but is observed to be ruminating on housing situation and referrals.   Transportation Means: Pt reports access to transportation  Supports: No supports mentioned at this time  Chad CordialLauren Carter, LCSWA 08/06/2015 9:58 AM

## 2015-08-06 NOTE — Progress Notes (Signed)
Recreation Therapy Notes  Date: 01.09.2017 Time: 9:30am Location: [address] Day[address]   Group Topic: Stress Management  Goal Area(s) Addresses:  Patient will actively participate in stress management techniques presented during session.   Behavioral Response: Did not attend.    Shakesha Soltau L Brogan England, LRT/CTRS        Libbi Towner L 08/06/2015 2:56 PM 

## 2015-08-06 NOTE — Progress Notes (Signed)
Patient ID: Joseph LefortReginald Kannan, male   DOB: 09/16/1960, 55 y.o.   MRN: [id number]  PER STATE REGULATIONS 482.30  THIS CHART WAS REVIEWED FOR MEDICAL NECESSITY WITH RESPECT TO THE PATIENT'S ADMISSION/ DURATION OF STAY.  NEXT REVIEW DATE: 08/09/2015  Willa RoughJENNIFER JONES [name], RN, BSN CASE MANAGER

## 2015-08-07 LAB — GLUCOSE, CAPILLARY
GLUCOSE-CAPILLARY: 165 mg/dL — AB (ref 65–99)
GLUCOSE-CAPILLARY: 168 mg/dL — AB (ref 65–99)
Glucose-Capillary: 126 mg/dL — ABNORMAL HIGH (ref 65–99)

## 2015-08-07 NOTE — [facility] Group Notes (Signed)
[facility] LCSW Group Therapy 08/07/2015 1:15 PM  Type of Therapy: Group Therapy- Feelings about Diagnosis  Participation Level: Active   Participation Quality:  Appropriate  Affect:  Appropriate  Cognitive: Alert and Oriented   Insight:  Developing   Engagement in Therapy: Developing/Improving and Engaged   Modes of Intervention: Clarification, Confrontation, Discussion, Education, Exploration, Limit-setting, Orientation, Problem-solving, Rapport Building, Dance movement psychotherapisteality Testing, Socialization and Support  Description of Group:   This group will allow patients to explore their thoughts and feelings about diagnoses they have received. Patients will be guided to explore their level of understanding and acceptance of these diagnoses. Facilitator will encourage patients to process their thoughts and feelings about the reactions of others to their diagnosis, and will guide patients in identifying ways to discuss their diagnosis with significant others in their lives. This group will be process-oriented, with patients participating in exploration of their own experiences as well as giving and receiving support and challenge from other group members.  Summary of Progress/Problems:  Pt affect is improving and he is participating actively. Pt identified a need to comply with treatment and medications outside of the facility. Pt expresses that the times he has been admitted into a hospital were all caused by him not complying with his treatment or heeding the advice of professionals.   Therapeutic Modalities:   Cognitive Behavioral Therapy Solution Focused Therapy Motivational Interviewing Relapse Prevention Therapy  Chad CordialLauren Carter, LCSWA 08/07/2015 3:15 PM

## 2015-08-07 NOTE — [facility] Group Notes (Signed)

## 2015-08-07 NOTE — Progress Notes (Signed)
D: Patient is safe on the unit. Wolfe rates Anxiety and Depression 6-7/10. He has been in his room most of the evening. States "I just don't feel good". I asked him to describe how he feels. He states "I'm just tired". He did not attend group tonight.  A: Q 15 minute checks for patient safety continue. Encouragement and support given. Medications administered as prescribed.  R: Continue to monitor for patient safety and medication effectiveness.

## 2015-08-07 NOTE — Progress Notes (Signed)
Patient did not attend wrap-up group, he was sleeping.

## 2015-08-07 NOTE — Progress Notes (Signed)
Patient ID: [name], male   DOB: [birth year], 55 y.o.   MRN: [id number] [facility] MD Progress Note  08/07/2015 12:49 PM [name]  MRN:  [id number] Subjective:  Patient reports ongoing improvement, compared to admission. As he improves he is focusing  More on discharge planning . Denies medication side effects.  Objective:  I have discussed case with treatment team and have met with patient. Patient improved compared to admission- at this time he is presenting with improved mood , improved range of affect, and has been more visible in the day room. Psychotic symptoms have also improved, and at this time patient  Describes resolving hallucinations, is not internally preoccupied, and no delusions are expressed . Denies medication side effects at this time.  As discussed with CSW, Treatment Team, patient has phone appointment with [facility] , in [place]. Patient hoping to be accepted, as he states he has participated in this residential  program before and remembers it was helpful. He expresses a desire to continue working on recovery/ abstinence.     Principal Problem: Schizoaffective disorder, bipolar type ([place]) Diagnosis:   Patient Active Problem List   Diagnosis Date Noted  . Cocaine use disorder, moderate, dependence ([place]) [F14.20] 08/03/2015  . Nausea with vomiting [R11.2] 08/03/2015  . Cannabis use disorder, moderate, dependence ([place]) [F12.20] 08/03/2015  . Schizoaffective disorder, bipolar type ([place]) [F25.0] 08/01/2015  . Hyperlipidemia [E78.5] 01/18/2015  . Hyperprolactinemia ([place]) [E22.1] 01/18/2015  . Diabetes mellitus ([place]) [E11.9] 03/11/2012  . HTN (hypertension) [I10] 03/11/2012   Total Time spent with patient:  20 minutes   Past Psychiatric History:Per EHR " Has been diagnosed with Schizoaffective Disorder, Several prior psychiatric admissions- has attempted suicide in the past. Last admission was June /16 for depression , suicidal ideations, also in relation to  death of loved ones . At that time was discharged on Abilify, 5 mgrs BID, and on Lamictal 50 mgrs QDAY . Has not been following recently as he relocated to [place]. States he has been taking Abilify fairly regularly, but not other medications . Denies history of violence. "  Past Medical History:  Past Medical History  Diagnosis Date  . Diabetes mellitus   . Bipolar affective disorder ([place])   . Schizophrenia, schizo-affective ([place])   . Hypertension   . Diabetes mellitus ([place]) 03/11/2012  . Bipolar disorder ([place]) 03/11/2012  . Anxiety   . Depression   . 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 Finger surgery    . Wisdom tooth extraction     Family History:  Family History  Problem Relation Age of Onset  . Diabetes Mother   . Bipolar disorder Sister    Family Psychiatric  History: Pt denies hx of mental illness, substance abuse or suicide in family. Social History: Patient is divorced, is on SSD, lives with mother. History  Alcohol Use No     History  Drug Use  . Yes  . Special: Marijuana, Cocaine    Comment: 4 days ago last THC and cocoaine use... relapsed after 2 yrs     Social History   Social History  . Marital Status: Single    Spouse Name: N/A  . Number of Children: N/A  . Years of Education: N/A   Social History Main Topics  . Smoking status: Never Smoker   . Smokeless tobacco: Never Used  . Alcohol Use: No  . Drug Use: Yes    Special: Marijuana,  Cocaine     Comment: 4 days ago last THC and cocoaine use... relapsed after 2 yrs   . Sexual Activity: Yes    Birth Control/ Protection: Condom     Comment: marijuana 25 days ago   Other Topics Concern  . None   Social History Narrative   Additional Social History:    Pain Medications: Pt denies Prescriptions: Abilify Over the Counter: Pt denies Longest period of sobriety (when/how long): unknown Withdrawal Symptoms: Agitation, Aggressive/Assaultive,  Irritability, Sweats Name of Substance 1: marijuana 1 - Age of First Use: unknown 1 - Amount (size/oz): 3 to 4 "blounts" per day 1 - Frequency: per day 1 - Duration: ongoing 1 - Last Use / Amount: 07/25/2015 Name of Substance 2: cocaine 2 - Age of First Use: unknown 2 - Amount (size/oz): unknkown 2 - Frequency: rarely 2 - Duration: unknown 2 - Last Use / Amount: 07/25/15  Sleep: improved  Appetite:  Improved   Current Medications: Current Facility-Administered Medications  Medication Dose Route Frequency Provider Last Rate Last Dose  . acetaminophen (TYLENOL) tablet 650 mg  650 mg Oral Q6H PRN Laverle Hobby, PA-C      . alum & mag hydroxide-simeth (MAALOX/MYLANTA) 200-200-20 MG/5ML suspension 30 mL  30 mL Oral Q4H PRN Laverle Hobby, PA-C      . ARIPiprazole (ABILIFY) tablet 5 mg  5 mg Oral Daily Jenne Campus, MD   5 mg at 08/07/15 0814  . glipiZIDE (GLUCOTROL) tablet 10 mg  10 mg Oral BID AC Laverle Hobby, PA-C   10 mg at 08/07/15 7092  . hydrOXYzine (ATARAX/VISTARIL) tablet 25 mg  25 mg Oral Q6H PRN Laverle Hobby, PA-C      . insulin aspart (novoLOG) injection 0-20 Units  0-20 Units Subcutaneous TID WC Laverle Hobby, PA-C   4 Units at 08/07/15 9574  . insulin aspart (novoLOG) injection 0-5 Units  0-5 Units Subcutaneous QHS Laverle Hobby, PA-C   0 Units at 08/01/15 2315  . insulin detemir (LEVEMIR) injection 40 Units  40 Units Subcutaneous QHS Ripley Fraise, MD   40 Units at 08/06/15 2208  . lamoTRIgine (LAMICTAL) tablet 50 mg  50 mg Oral QHS Kerrie Buffalo, NP   50 mg at 08/06/15 2208  . magnesium hydroxide (MILK OF MAGNESIA) suspension 30 mL  30 mL Oral Daily PRN Laverle Hobby, PA-C      . metFORMIN (GLUCOPHAGE) tablet 1,000 mg  1,000 mg Oral BID WC Laverle Hobby, PA-C   1,000 mg at 08/07/15 0814  . ondansetron (ZOFRAN-ODT) disintegrating tablet 8 mg  8 mg Oral Q8H PRN Saramma Eappen, MD      . sertraline (ZOLOFT) tablet 50 mg  50 mg Oral Daily Jenne Campus, MD   50 mg at 08/07/15 0814  . simvastatin (ZOCOR) tablet 20 mg  20 mg Oral q1800 Ursula Alert, MD   20 mg at 08/06/15 1822  . zolpidem (AMBIEN) tablet 5 mg  5 mg Oral QHS Ursula Alert, MD   5 mg at 08/06/15 2211    Lab Results:  Results for orders placed or performed during the hospital encounter of 08/01/15 (from the past 48 hour(s))  Glucose, capillary     Status: Abnormal   Collection Time: 08/05/15  5:06 PM  Result Value Ref Range   Glucose-Capillary 166 (H) 65 - 99 mg/dL   Comment 1 Notify RN    Comment 2 Document in Chart   Glucose, capillary  Status: Abnormal   Collection Time: 08/05/15  8:45 PM  Result Value Ref Range   Glucose-Capillary 118 (H) 65 - 99 mg/dL  Glucose, capillary     Status: Abnormal   Collection Time: 08/06/15  5:56 AM  Result Value Ref Range   Glucose-Capillary 200 (H) 65 - 99 mg/dL  Glucose, capillary     Status: Abnormal   Collection Time: 08/06/15  4:24 PM  Result Value Ref Range   Glucose-Capillary 170 (H) 65 - 99 mg/dL   Comment 1 Notify RN   Glucose, capillary     Status: Abnormal   Collection Time: 08/06/15  8:43 PM  Result Value Ref Range   Glucose-Capillary 139 (H) 65 - 99 mg/dL  Glucose, capillary     Status: Abnormal   Collection Time: 08/07/15  6:22 AM  Result Value Ref Range   Glucose-Capillary 165 (H) 65 - 99 mg/dL  Glucose, capillary     Status: Abnormal   Collection Time: 08/07/15 11:47 AM  Result Value Ref Range   Glucose-Capillary 126 (H) 65 - 99 mg/dL    Physical Findings: AIMS: Facial and Oral Movements Muscles of Facial Expression: None, normal Lips and Perioral Area: None, normal Jaw: None, normal Tongue: None, normal,Extremity Movements Upper (arms, wrists, hands, fingers): None, normal Lower (legs, knees, ankles, toes): None, normal, Trunk Movements Neck, shoulders, hips: None, normal, Overall Severity Severity of abnormal movements (highest score from questions above): None, normal Incapacitation due  to abnormal movements: None, normal Patient's awareness of abnormal movements (rate only patient's report): No Awareness, Dental Status Current problems with teeth and/or dentures?: No Does patient usually wear dentures?: No  CIWA:  CIWA-Ar Total: 1 COWS:  COWS Total Score: 1  Musculoskeletal: Strength & Muscle Tone: within normal limits Gait & Station: normal Patient leans: N/A  Psychiatric Specialty Exam: Review of Systems  Psychiatric/Behavioral: Positive for depression and substance abuse. The patient has insomnia.   All other systems reviewed and are negative. nausea, now improved, no vomiting today, no abdominal pain   Blood pressure 103/80, pulse 80, temperature 97.9 F (36.6 C), temperature source Oral, resp. rate 18, height 5' 9"  (1.753 m), weight 225 lb (102.059 kg), SpO2 100 %.Body mass index is 33.21 kg/(m^2).  General Appearance: Casual  Eye Contact::   Improved eye contact   Speech:  Normal Rate  Volume:   Normal   Mood:  Mood improved  Affect:  Improved range of affect compared to admission  Thought Process:  Goal Directed  Orientation:  Full (Time, Place, and Person)  Thought Content:  At this time not internally preoccupied, no delusions expressed   Suicidal Thoughts:  No currently denies plan or intention of hurting self/suicide   Homicidal Thoughts:  No denies any homicidal or violent ideations at this time  Memory:   Recent and remote grossly intact   Judgement:  Improving   Insight:    Improving   Psychomotor Activity:  Normal  Concentration:  Good  Recall:  Good  Fund of Knowledge:Fair  Language: Good  Akathisia:  No    AIMS (if indicated):     Assets:  Desire for Improvement  ADL's:  Intact  Cognition: WNL  Sleep:  Number of Hours: 6.75   Treatment Plan Summary: Patient improving compared to admission, and has tolerated Abilify /Zoloft  Well. No increased nausea or vomiting reported today, and appears comfortable, not in any acute distress.   Currently depression, psychotic symptoms improved . At this time , as he improves,  he is becoming more focused on disposition planning and is hoping to be accepted to [facility] , which he has done well in in the past. He has a phone interview with [facility] tomorrow, as reviewed with treatment team.    Daily contact with patient to assess and evaluate symptoms and progress in treatment and Medication management  Encourage milileu , group participation to work on coping skills and work on symptom reduction.   Continue Zoloft  50 mgrs QDAY for depression and anxiety Continue  Lamictal 50 mg po qhs for mood lability. Continue Abilify 5 mgrs QDAYR for mood disorder, psychosis Continue Ambien  5 mgrs QHS PRN for insomnia as needed  Treatment team working on disposition planning . [facility] appointment with [facility] upcoming .  Patient to participate in therapeutic milieu .    Neita Garnet 08/07/2015 12:49 PM

## 2015-08-07 NOTE — Progress Notes (Signed)
NUTRITION NOTE  Consult received for DM and hyperlipidemia diet education. Hyperlipidemia/healthy eating packet provided to pt from RN per new protocol concerning hyperlipidemia consults. Pt laying in bed at time of RD visit this AM. Notes indicate that pt did not attend wrap-up group last night and that he has been staying in his room other than to get up for med pass. Knocked on pt's door and introduced self to pt. Pt states "I am fine with my diet." Informed pt that MD would like RD to talk with pt about diet-related ways to control CBGs upon d/c. Pt not interested in talking with RD and would like handouts to be left for him to review on his own when he is ready.   Provided pt with "Carbohydrate Counting for People with Diabetes" and "Nutrition Label Reading Tips for Patients with Diabetes" handouts from the Academy of Nutrition and Dietetics. Expect fair to poor compliance based on interaction with pt.   Pt currently on a Regular diet and is eating as desired for meals and snacks. Medications reviewed; 1000 mg Metformin BID, 10 mg Glucotrol BID, sliding scale Novolog, and 40 units Levemir at bedtime. Labs reviewed; CBGs: 94-211 mg/dL.     Trenton GammonJessica Paris Hohn, RD, LDN Inpatient Clinical Dietitian Pager # [phone] After hours/weekend pager # [phone]

## 2015-08-07 NOTE — Tx Team (Signed)
Interdisciplinary Treatment Plan Update (Adult) Date: 08/07/2015   Date: 08/07/2015 9:21 AM  Progress in Treatment:  Attending groups: Yes  Participating in groups: Yes  Taking medication as prescribed: Yes  Tolerating medication: Yes  Family/Significant othe contact made: No, Pt declines Patient understands diagnosis: Yes AEB seeking help with addiction and depression Discussing patient identified problems/goals with staff: Yes  Medical problems stabilized or resolved: Yes  Denies suicidal/homicidal ideation: No, Pt endorsing SI "off and on" Patient has not harmed self or Others: Yes   New problem(s) identified: None identified at this time.   Discharge Plan or Barriers: Pt will return [facility] and follow-up with outpatient resources  08/07/15: Pt has [facility] to [facility]; phone interview on 1/11  Additional comments:  Patient and CSW reviewed pt's identified goals and treatment plan. Patient verbalized understanding and agreed to treatment plan. CSW reviewed [facility] "Discharge Process and Patient Involvement" Form. Pt verbalized understanding of information provided and signed form.   Reason for Continuation of Hospitalization:  Depression Medication stabilization Suicidal ideation Withdrawal symptoms Auditory Hallucinations  Estimated length of stay: 2-3 days  Review of initial/current patient goals per problem list:   1.  Goal(s): Patient will participate in aftercare plan  Met:  Yes  Target date: 3-5 days from date of admission   As evidenced by: Patient will participate within aftercare plan AEB aftercare provider and housing plan at discharge being identified.   08/02/15: Pt will return to [facility] and follow-up with outpatient resources  08/07/15: Pt is now requesting to go to [facility]; phone interview on 1/11 at 2pm  2.  Goal (s): Patient will exhibit decreased depressive symptoms and suicidal ideations.  Met:  No  Target date: 3-5  days from date of admission   As evidenced by: Patient will utilize self rating of depression at 3 or below and demonstrate decreased signs of depression or be deemed stable for discharge by MD. 08/02/15: Pt was admitted with symptoms of depression, rating 10/10. Pt continues to present with flat affect and depressive symptoms.  Pt will demonstrate decreased symptoms of depression and rate depression at 3/10 or lower prior to discharge. 08/07/15: Pt continues to rate depression at high rates; endorses passive SI "off and on"  4.  Goal(s): Patient will demonstrate decreased signs of withdrawal due to substance abuse  Met:  Yes  Target date: 3-5 days from date of admission   As evidenced by: Patient will produce a CIWA/COWS score of 0, have stable vitals signs, and no symptoms of withdrawal  08/02/15: Pt has COWS score of 4; endorses restlessness, GI upset, and anxiety as symptoms of withdrawal.  08/07/15: Pt has COWS score of 1/10, however anxiety is sole symptom Pt reports. No longer on protocol. 5.  Goal(s): Patient will demonstrate decreased signs of psychosis  . Met:  No . Target date: 3-5 days from date of admission  . As evidenced by: Patient will demonstrate decreased frequency of AVH or return to baseline function    -08/02/15: Pt recently admitted with    command hallucinations to harm    himself and others.     -08/07/15: Continues to endorse AH.   Attendees:  Patient:    Family:    Physician: Dr. Parke Poisson, MD  08/07/2015 9:21 AM  Nursing: Lars Pinks, RN Case manager  08/07/2015 9:21 AM  Clinical Social Worker Peri Maris, [place] 08/07/2015 9:21 AM  Other: Tilden Fossa, [place] 08/07/2015 9:21 AM  Clinical:  Darrol Angel, RN  08/07/2015 9:21 AM  Other: , RN Charge Nurse 08/07/2015 9:21 AM  Other: Hilda Lias, [place], [place] Work [phone]

## 2015-08-07 NOTE — Progress Notes (Signed)
D: Patient alert and oriented x 4. Patient denies pain/SI/HI/AVH. Patient stayed in bed during shift only getting up to get medications.  A: Staff to monitor Q 15 mins for safety. Encouragement and support offered. Scheduled medications administered per orders. R: Patient remains safe on the unit. Patient did not attended group tonight. Patient visible on the unit and interacting with peers. Patient taking administered medications.

## 2015-08-07 NOTE — Progress Notes (Signed)
D: Pt presents with flat affect and depressed mood. Pt rates depression 7/10. Anxiety 7/10. Hopeless 7/10. Pt endorses AH to hurt himself. Pt denies suicidal thoughts and verbally contracts for safety. Pt reported fair sleep and appetite. Pt stated goal "get better and go to groups" Pt mood labile and pt have periods were he is easily agitated and irritable.  A: Medications administered as ordered per MD. Verbal support given. Pt encouraged to attend groups. 15 minute checks performed for safety. R: Pt receptive to tx.

## 2015-08-08 LAB — GLUCOSE, CAPILLARY
GLUCOSE-CAPILLARY: 149 mg/dL — AB (ref 65–99)
GLUCOSE-CAPILLARY: 196 mg/dL — AB (ref 65–99)
Glucose-Capillary: 200 mg/dL — ABNORMAL HIGH (ref 65–99)
Glucose-Capillary: 178 mg/dL — ABNORMAL HIGH (ref 65–99)
Glucose-Capillary: 131 mg/dL — ABNORMAL HIGH (ref 65–99)

## 2015-08-08 MED ORDER — SERTRALINE HCL 100 MG PO TABS
100.0000 mg | ORAL_TABLET | Freq: Every day | ORAL | Status: DC
  Administered 2015-08-09 – 2015-08-10 (×2): 100 mg via ORAL
  Filled 2015-08-08 (×4): qty 1

## 2015-08-08 NOTE — Progress Notes (Signed)
D: Pt presents anxious on approach. Pt verbalize that he's concerned about discharge plans. Pt passive SI w/no plan. Pt verbally contracts for safety. Pt rates depression 7/10. Anxiety 7/10. Hopelessness 7/10. Pt denies AH today. Pt easily agitated and irritable during the day. Pt stated goal "going to program". Pt compliant with taking meds and attending groups.  A: Medications administer as ordered per MD. Verbal support given. Pt encouraged to attend groups. 15 minute checks performed for safety.  R: Pt receptive to tx.

## 2015-08-08 NOTE — [facility] Group Notes (Signed)
[facility] LCSW Group Therapy 08/08/2015 1:15 PM  Type of Therapy: Group Therapy- Emotion Regulation  Participation Level: Reserved  Participation Quality:  Appropriate  Affect: Appropriate  Cognitive: Alert and Oriented   Insight:  Developing/Improving  Engagement in Therapy: Developing/Improving and Engaged   Modes of Intervention: Clarification, Confrontation, Discussion, Education, Exploration, Limit-setting, Orientation, Problem-solving, Rapport Building, Dance movement psychotherapisteality Testing, Socialization and Support  Summary of Progress/Problems: The topic for group today was emotional regulation. This group focused on both positive and negative emotion identification and allowed group members to process ways to identify feelings, regulate negative emotions, and find healthy ways to manage internal/external emotions. Group members were asked to reflect on a time when their reaction to an emotion led to a negative outcome and explored how alternative responses using emotion regulation would have benefited them. Group members were also asked to discuss a time when emotion regulation was utilized when a negative emotion was experienced. Pt participated minimally but did offer suggestions to other group members on how to set boundaries in destructive relationships, particularly with family members who abuse substances.   Joseph CordialLauren Carter, LCSWA 08/08/2015 3:26 PM

## 2015-08-08 NOTE — [facility] Group Notes (Signed)
[facility]  08/08/2015 8:45 AM  Participation Quality: Alert, Appropriate and Oriented  Mood/Affect: Flat; Irritable  Depression Rating: 10  Anxiety Rating: 10  Thoughts of Suicide: Pt denies SI/HI  Will you contract for safety? Yes  Current AVH: Pt endorses AH  Plan for Discharge/Comments: Pt attended discharge planning group and actively participated in group. CSW discussed suicide prevention education with the group and encouraged them to discuss discharge planning and any relevant barriers. Pt presents as irritable this morning because he can't see "progress" with referrals. Pt reports feeling hopeless because "nothing is guaranteed."   Transportation Means: Pt reports access to transportation  Supports: No supports mentioned at this time  Chad CordialLauren Carter, LCSWA 08/08/2015 9:29 AM

## 2015-08-08 NOTE — Progress Notes (Addendum)
Patient ID: [name], male   DOB: 02-27-1961, 55 y.o.   MRN: [id number] [facility] MD Progress Note  08/08/2015 2:03 PM Frans Valente  MRN:  [id number] Subjective:  Patient reports feeling better overall , but states he feels anxious and still somewhat depressed, which he attributes mostly to anxiety about disposition planning. States his plan is to go to [facility] , which is out of state, and has phone interview later today- he worries about being accepted and if so, bed availability. He states " I found out that they gave up my bed at [facility] " ( where he had been residing prior ) , and therefore worries " where I would go if this [facility] does not work out ". States his mood is improved compared to admission. Denies medication  Side effects.   Objective:  I have discussed case with treatment team and have met with patient. Patient more visible on unit, going to some groups, behavior on unit in good control. As per staff, continues to report some depression and anxiety. Auditory hallucinations have subsided- at this time patient not internally preoccupied or paranoid, and thought process linear . Tolerating medications well. As above , focused on disposition planning/ future oriented . Anxious about upcoming phone interview with  [facility] and what disposition will be.  Responds well to support, encouragement , empathy. He expresses a desire to continue working on recovery/ abstinence.     Principal Problem: Schizoaffective disorder, bipolar type ([place]) Diagnosis:   Patient Active Problem List   Diagnosis Date Noted  . Cocaine use disorder, moderate, dependence ([place]) [F14.20] 08/03/2015  . Nausea with vomiting [R11.2] 08/03/2015  . Cannabis use disorder, moderate, dependence ([place]) [F12.20] 08/03/2015  . Schizoaffective disorder, bipolar type ([place]) [F25.0] 08/01/2015  . Hyperlipidemia [E78.5] 01/18/2015  . Hyperprolactinemia ([place]) [E22.1] 01/18/2015  . Diabetes  mellitus ([place]) [E11.9] 03/11/2012  . HTN (hypertension) [I10] 03/11/2012   Total Time spent with patient:  20 minutes   Past Psychiatric History:Per EHR " Has been diagnosed with Schizoaffective Disorder, Several prior psychiatric admissions- has attempted suicide in the past. Last admission was June /16 for depression , suicidal ideations, also in relation to death of loved ones . At that time was discharged on Abilify, 5 mgrs BID, and on Lamictal 50 mgrs QDAY . Has not been following recently as he relocated to [place]. States he has been taking Abilify fairly regularly, but not other medications . Denies history of violence. "  Past Medical History:  Past Medical History  Diagnosis Date  . Diabetes mellitus   . Bipolar affective disorder ([place])   . Schizophrenia, schizo-affective ([place])   . Hypertension   . Diabetes mellitus ([place]) 03/11/2012  . Bipolar disorder ([place]) 03/11/2012  . Anxiety   . Depression   . 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 Finger surgery    . Wisdom tooth extraction     Family History:  Family History  Problem Relation Age of Onset  . Diabetes Mother   . Bipolar disorder Sister    Family Psychiatric  History: Pt denies hx of mental illness, substance abuse or suicide in family. Social History: Patient is divorced, is on SSD, lives with mother. History  Alcohol Use No     History  Drug Use  . Yes  . Special: Marijuana, Cocaine    Comment: 4 days ago last THC and cocoaine use... relapsed after 2 yrs  Social History   Social History  . Marital Status: Single    Spouse Name: N/A  . Number of Children: N/A  . Years of Education: N/A   Social History Main Topics  . Smoking status: Never Smoker   . Smokeless tobacco: Never Used  . Alcohol Use: No  . Drug Use: Yes    Special: Marijuana, Cocaine     Comment: 4 days ago last THC and cocoaine use... relapsed after 2 yrs   . Sexual Activity: Yes     Birth Control/ Protection: Condom     Comment: marijuana 25 days ago   Other Topics Concern  . None   Social History Narrative   Additional Social History:    Pain Medications: Pt denies Prescriptions: Abilify Over the Counter: Pt denies Longest period of sobriety (when/how long): unknown Withdrawal Symptoms: Agitation, Aggressive/Assaultive, Irritability, Sweats Name of Substance 1: marijuana 1 - Age of First Use: unknown 1 - Amount (size/oz): 3 to 4 "blounts" per day 1 - Frequency: per day 1 - Duration: ongoing 1 - Last Use / Amount: 07/25/2015 Name of Substance 2: cocaine 2 - Age of First Use: unknown 2 - Amount (size/oz): unknkown 2 - Frequency: rarely 2 - Duration: unknown 2 - Last Use / Amount: 07/25/15  Sleep: improved  Appetite:  Improved   Current Medications: Current Facility-Administered Medications  Medication Dose Route Frequency Provider Last Rate Last Dose  . acetaminophen (TYLENOL) tablet 650 mg  650 mg Oral Q6H PRN Laverle Hobby, PA-C      . alum & mag hydroxide-simeth (MAALOX/MYLANTA) 200-200-20 MG/5ML suspension 30 mL  30 mL Oral Q4H PRN Laverle Hobby, PA-C      . ARIPiprazole (ABILIFY) tablet 5 mg  5 mg Oral Daily Jenne Campus, MD   5 mg at 08/08/15 0819  . glipiZIDE (GLUCOTROL) tablet 10 mg  10 mg Oral BID AC Laverle Hobby, PA-C   10 mg at 08/08/15 8938  . hydrOXYzine (ATARAX/VISTARIL) tablet 25 mg  25 mg Oral Q6H PRN Laverle Hobby, PA-C      . insulin aspart (novoLOG) injection 0-20 Units  0-20 Units Subcutaneous TID WC Laverle Hobby, PA-C   3 Units at 08/08/15 1207  . insulin aspart (novoLOG) injection 0-5 Units  0-5 Units Subcutaneous QHS Laverle Hobby, PA-C   0 Units at 08/01/15 2315  . insulin detemir (LEVEMIR) injection 40 Units  40 Units Subcutaneous QHS Ripley Fraise, MD   40 Units at 08/07/15 2225  . lamoTRIgine (LAMICTAL) tablet 50 mg  50 mg Oral QHS Kerrie Buffalo, NP   50 mg at 08/07/15 2226  . magnesium hydroxide  (MILK OF MAGNESIA) suspension 30 mL  30 mL Oral Daily PRN Laverle Hobby, PA-C      . metFORMIN (GLUCOPHAGE) tablet 1,000 mg  1,000 mg Oral BID WC Laverle Hobby, PA-C   1,000 mg at 08/08/15 0819  . ondansetron (ZOFRAN-ODT) disintegrating tablet 8 mg  8 mg Oral Q8H PRN Ursula Alert, MD      . Derrill Memo ON 08/09/2015] sertraline (ZOLOFT) tablet 100 mg  100 mg Oral Daily Myer Peer Cobos, MD      . simvastatin (ZOCOR) tablet 20 mg  20 mg Oral q1800 Saramma Eappen, MD   20 mg at 08/07/15 1718  . zolpidem (AMBIEN) tablet 5 mg  5 mg Oral QHS Ursula Alert, MD   5 mg at 08/06/15 2211    Lab Results:  Results for orders placed or  performed during the hospital encounter of 08/01/15 (from the past 48 hour(s))  Glucose, capillary     Status: Abnormal   Collection Time: 08/06/15  4:24 PM  Result Value Ref Range   Glucose-Capillary 170 (H) 65 - 99 mg/dL   Comment 1 Notify RN   Glucose, capillary     Status: Abnormal   Collection Time: 08/06/15  8:43 PM  Result Value Ref Range   Glucose-Capillary 139 (H) 65 - 99 mg/dL  Glucose, capillary     Status: Abnormal   Collection Time: 08/07/15  6:22 AM  Result Value Ref Range   Glucose-Capillary 165 (H) 65 - 99 mg/dL  Glucose, capillary     Status: Abnormal   Collection Time: 08/07/15 11:47 AM  Result Value Ref Range   Glucose-Capillary 126 (H) 65 - 99 mg/dL  Glucose, capillary     Status: Abnormal   Collection Time: 08/07/15  5:00 PM  Result Value Ref Range   Glucose-Capillary 168 (H) 65 - 99 mg/dL  Glucose, capillary     Status: Abnormal   Collection Time: 08/08/15  6:09 AM  Result Value Ref Range   Glucose-Capillary 178 (H) 65 - 99 mg/dL  Glucose, capillary     Status: Abnormal   Collection Time: 08/08/15 11:44 AM  Result Value Ref Range   Glucose-Capillary 149 (H) 65 - 99 mg/dL    Physical Findings: AIMS: Facial and Oral Movements Muscles of Facial Expression: None, normal Lips and Perioral Area: None, normal Jaw: None, normal Tongue:  None, normal,Extremity Movements Upper (arms, wrists, hands, fingers): None, normal Lower (legs, knees, ankles, toes): None, normal, Trunk Movements Neck, shoulders, hips: None, normal, Overall Severity Severity of abnormal movements (highest score from questions above): None, normal Incapacitation due to abnormal movements: None, normal Patient's awareness of abnormal movements (rate only patient's report): No Awareness, Dental Status Current problems with teeth and/or dentures?: No Does patient usually wear dentures?: No  CIWA:  CIWA-Ar Total: 0 COWS:  COWS Total Score: 0  Musculoskeletal: Strength & Muscle Tone: within normal limits Gait & Station: normal Patient leans: N/A  Psychiatric Specialty Exam: Review of Systems  Psychiatric/Behavioral: Positive for depression and substance abuse. The patient has insomnia.   All other systems reviewed and are negative. nausea, now improved, no vomiting today, no abdominal pain   Blood pressure 121/89, pulse 74, temperature 98.5 F (36.9 C), temperature source Oral, resp. rate 18, height _0  (1.753 m), weight 225 lb (102.059 kg), SpO2 100 %.Body mass index is 33.21 kg/(m^2).  General Appearance: Casual  Eye Contact::   Improved eye contact   Speech:  Normal Rate  Volume:   Normal   Mood:  Mood improved  Affect:  Although improved compared to admission, still anxious and ruminative, at this time mostly related to disposition planning issues   Thought Process:  Goal Directed  Orientation:  Full (Time, Place, and Person)  Thought Content:  At this time not internally preoccupied, no delusions expressed - states that hallucinations have lessened , but not completely resolved   Suicidal Thoughts:  No currently denies plan or intention of hurting self/suicide   Homicidal Thoughts:  No denies any homicidal or violent ideations at this time  Memory:   Recent and remote grossly intact   Judgement:  Improving   Insight:    Improving    Psychomotor Activity:  Normal  Concentration:  Good  Recall:  Good  Fund of Knowledge:Fair  Language: Good  Akathisia:  No  AIMS (if indicated):     Assets:  Desire for Improvement  ADL's:  Intact  Cognition: WNL  Sleep:  Number of Hours: 6.75   Treatment Plan Summary: Patient  Continues to improve compared to admission presentation, and at this time presents with improved range of affect , no current presentation of psychosis- states hallucinations have subsided, not internally preoccupied, no delusions. He does remain anxious, particularly about disposition planning issues, and ruminates about what he will do if not accepted promptly to [facility]. Thus far tolerating Abilify/ Zoloft well .   Daily contact with patient to assess and evaluate symptoms and progress in treatment and Medication management  Encourage milileu , group participation to work on coping skills and work on symptom reduction.   Increase Zoloft  To 100  mgrs QDAY to address ongoing depression and anxiety Continue  Lamictal 50 mg po qhs for mood lability. Continue Abilify 5 mgrs QDAY for mood disorder, psychosis Continue Ambien  5 mgrs QHS PRN for insomnia as needed  Treatment team working on disposition planning . Phone appointment with [facility] upcoming .  Will request dietary consult to provide diet education related to DM/poorly controlled HgbA1C Patient to participate in therapeutic milieu .    COBOS, [place] 08/08/2015 2:03 PM

## 2015-08-08 NOTE — Progress Notes (Signed)
Patient did not attend group.

## 2015-08-08 NOTE — Progress Notes (Signed)
Pt did not attend wrap-up group   

## 2015-08-08 NOTE — Progress Notes (Signed)
Patient ID: [name], male   DOB: [birth year], 55 y.o.   MRN: [id number] D: Patient mood and affect appeared depressed and flat. Pt denies SI/HI/AVH and pain. Pt reports tolerating medication well. Cooperative with assessment. No acute distressed noted.    A: Met with pt 1:1. Medications administered as prescribed. Support and encouragement provided to attend groups and engage in milieu. Pt encouraged to discuss feelings and come to staff with any question or concerns.   R: Patient remains safe and complaint with medications.

## 2015-08-08 NOTE — Progress Notes (Signed)
Recreation Therapy Notes  Date: 01.11.2017  Time: 9:30am Location: [address] Group Room   Group Topic: Stress Management  Goal Area(s) Addresses:  Patient will actively participate in stress management techniques presented during session.   Behavioral Response: Did not attend.   Tavious Griesinger L Essence Merle, LRT/CTRS        Dyamon Sosinski L 08/08/2015 6:32 PM 

## 2015-08-09 LAB — GLUCOSE, CAPILLARY
GLUCOSE-CAPILLARY: 165 mg/dL — AB (ref 65–99)
GLUCOSE-CAPILLARY: 132 mg/dL — AB (ref 65–99)
GLUCOSE-CAPILLARY: 210 mg/dL — AB (ref 65–99)
GLUCOSE-CAPILLARY: 94 mg/dL (ref 65–99)

## 2015-08-09 NOTE — Progress Notes (Signed)
NUTRITION NOTE  New consult for DM education. RD had previously been consulted for the same and was seen on 08/07/15 at which time pt refused to talk with RD and requested that handouts simply be left on night stand for him to review on his own time. Provided pt with handouts at that time that outlined carbohydrate-containing foods, appropriate serving sizes for these foods, number of grams of carbohydrate appropriate for meals and snacks, and label reading tips concerning carbohydrates and portion/serving sizes.   Pt not in room at this time, in group. Per Psychiatry note today at [id number], pt to go to [facility] [place] and may be able to go tomorrow (1/13) dependent on their bed availability.    Trenton GammonJessica Jarom Govan, RD, LDN Inpatient Clinical Dietitian Pager # [phone] After hours/weekend pager # [phone]

## 2015-08-09 NOTE — Progress Notes (Signed)
Patient ID: Joseph LefortReginald Brayman, male   DOB: 11/04/1960, 55 y.o.   MRN: [id number] D: Client in bed most of this shift, but up for snacks. Client [place]. Client reports he is looking forward to going to treatment, says he is ready for a change. A: Writer provided emotional support, reviewed medications, administered as prescribed. Staff will monitor q4215min for safety. R: Client is safe on the unit, did not attend group.

## 2015-08-09 NOTE — Progress Notes (Signed)
Patient ID: Joseph LefortReginald Vasquez, male   DOB: 02/02/1961, 55 y.o.   MRN: [id number] D: Patient has a flat affect on approach this am but does report mood better than on admission. Awaiting placement at this time with probable discharge tomorrow. Reports depression, anxiety, and hopelessness at a "5".  A: Staff will monitor on q 15 minute checks, follow treatment plan, and give medications as ordered. R: Cooperative on the unit.

## 2015-08-09 NOTE — Progress Notes (Signed)
Patient ID: [name], male   DOB: November 11, 1960, 55 y.o.   MRN: [id number] [facility] MD Progress Note  08/09/2015 1:06 PM [name]  MRN:  [id number] Subjective:  Patient reports he is currently feeling better, and less apprehensive since he found out that he is accepted to [facility]. At this time denies medication  Side effects.  Objective:  I have discussed case with treatment team and have met with patient. As discussed with patient and with CSW, patient accepted to [facility], a residential sober setting in [place]. Patient has been there before and states he completed the program successfully. As per CSW- he may have a bed available tomorrow/ day after, and at this time patient focusing /  working on transportation arrangements. After this news he has been less anxious, less apprehensive. Today visible on unit, behavior in good control.  Not endorsing medication side effects at present . Glycemias have improved- 132 today.     Principal Problem: Schizoaffective disorder, bipolar type ([place]) Diagnosis:   Patient Active Problem List   Diagnosis Date Noted  . Cocaine use disorder, moderate, dependence ([place]) [F14.20] 08/03/2015  . Nausea with vomiting [R11.2] 08/03/2015  . Cannabis use disorder, moderate, dependence ([place]) [F12.20] 08/03/2015  . Schizoaffective disorder, bipolar type ([place]) [F25.0] 08/01/2015  . Hyperlipidemia [E78.5] 01/18/2015  . Hyperprolactinemia ([place]) [E22.1] 01/18/2015  . Diabetes mellitus ([place]) [E11.9] 03/11/2012  . HTN (hypertension) [I10] 03/11/2012   Total Time spent with patient:  20 minutes   Past Psychiatric History:Per EHR " Has been diagnosed with Schizoaffective Disorder, Several prior psychiatric admissions- has attempted suicide in the past. Last admission was June /16 for depression , suicidal ideations, also in relation to death of loved ones . At that time was discharged on Abilify, 5 mgrs BID, and on Lamictal 50 mgrs QDAY  . Has not been following recently as he relocated to [place]. States he has been taking Abilify fairly regularly, but not other medications . Denies history of violence. "  Past Medical History:  Past Medical History  Diagnosis Date  . Diabetes mellitus   . Bipolar affective disorder ([place])   . Schizophrenia, schizo-affective ([place])   . Hypertension   . Diabetes mellitus ([place]) 03/11/2012  . Bipolar disorder ([place]) 03/11/2012  . Anxiety   . Depression   . 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 Finger surgery    . Wisdom tooth extraction     Family History:  Family History  Problem Relation Age of Onset  . Diabetes Mother   . Bipolar disorder Sister    Family Psychiatric  History: Pt denies hx of mental illness, substance abuse or suicide in family. Social History: Patient is divorced, is on SSD, lives with mother. History  Alcohol Use No     History  Drug Use  . Yes  . Special: Marijuana, Cocaine    Comment: 4 days ago last THC and cocoaine use... relapsed after 2 yrs     Social History   Social History  . Marital Status: Single    Spouse Name: N/A  . Number of Children: N/A  . Years of Education: N/A   Social History Main Topics  . Smoking status: Never Smoker   . Smokeless tobacco: Never Used  . Alcohol Use: No  . Drug Use: Yes    Special: Marijuana, Cocaine     Comment: 4 days ago last THC and cocoaine use.Marland KitchenMarland Kitchen  relapsed after 2 yrs   . Sexual Activity: Yes    Birth Control/ Protection: Condom     Comment: marijuana 25 days ago   Other Topics Concern  . None   Social History Narrative   Additional Social History:    Pain Medications: Pt denies Prescriptions: Abilify Over the Counter: Pt denies Longest period of sobriety (when/how long): unknown Withdrawal Symptoms: Agitation, Aggressive/Assaultive, Irritability, Sweats Name of Substance 1: marijuana 1 - Age of First Use: unknown 1 - Amount (size/oz): 3  to 4 "blounts" per day 1 - Frequency: per day 1 - Duration: ongoing 1 - Last Use / Amount: 07/25/2015 Name of Substance 2: cocaine 2 - Age of First Use: unknown 2 - Amount (size/oz): unknkown 2 - Frequency: rarely 2 - Duration: unknown 2 - Last Use / Amount: 07/25/15  Sleep: improved  Appetite:  Improved   Current Medications: Current Facility-Administered Medications  Medication Dose Route Frequency Provider Last Rate Last Dose  . acetaminophen (TYLENOL) tablet 650 mg  650 mg Oral Q6H PRN Laverle Hobby, PA-C      . alum & mag hydroxide-simeth (MAALOX/MYLANTA) 200-200-20 MG/5ML suspension 30 mL  30 mL Oral Q4H PRN Laverle Hobby, PA-C      . ARIPiprazole (ABILIFY) tablet 5 mg  5 mg Oral Daily Jenne Campus, MD   5 mg at 08/09/15 0815  . glipiZIDE (GLUCOTROL) tablet 10 mg  10 mg Oral BID AC Laverle Hobby, PA-C   10 mg at 08/09/15 7903  . hydrOXYzine (ATARAX/VISTARIL) tablet 25 mg  25 mg Oral Q6H PRN Laverle Hobby, PA-C      . insulin aspart (novoLOG) injection 0-20 Units  0-20 Units Subcutaneous TID WC Laverle Hobby, PA-C   3 Units at 08/09/15 1147  . insulin aspart (novoLOG) injection 0-5 Units  0-5 Units Subcutaneous QHS Laverle Hobby, PA-C   0 Units at 08/01/15 2315  . insulin detemir (LEVEMIR) injection 40 Units  40 Units Subcutaneous QHS Ripley Fraise, MD   40 Units at 08/08/15 2149  . lamoTRIgine (LAMICTAL) tablet 50 mg  50 mg Oral QHS Kerrie Buffalo, NP   50 mg at 08/08/15 2146  . magnesium hydroxide (MILK OF MAGNESIA) suspension 30 mL  30 mL Oral Daily PRN Laverle Hobby, PA-C      . metFORMIN (GLUCOPHAGE) tablet 1,000 mg  1,000 mg Oral BID WC Laverle Hobby, PA-C   1,000 mg at 08/09/15 0815  . ondansetron (ZOFRAN-ODT) disintegrating tablet 8 mg  8 mg Oral Q8H PRN Saramma Eappen, MD      . sertraline (ZOLOFT) tablet 100 mg  100 mg Oral Daily Jenne Campus, MD   100 mg at 08/09/15 0815  . simvastatin (ZOCOR) tablet 20 mg  20 mg Oral q1800 Ursula Alert, MD    20 mg at 08/08/15 1712  . zolpidem (AMBIEN) tablet 5 mg  5 mg Oral QHS Ursula Alert, MD   5 mg at 08/06/15 2211    Lab Results:  Results for orders placed or performed during the hospital encounter of 08/01/15 (from the past 48 hour(s))  Glucose, capillary     Status: Abnormal   Collection Time: 08/07/15  5:00 PM  Result Value Ref Range   Glucose-Capillary 168 (H) 65 - 99 mg/dL  Glucose, capillary     Status: Abnormal   Collection Time: 08/08/15  6:09 AM  Result Value Ref Range   Glucose-Capillary 178 (H) 65 - 99 mg/dL  Glucose, capillary  Status: Abnormal   Collection Time: 08/08/15 11:44 AM  Result Value Ref Range   Glucose-Capillary 149 (H) 65 - 99 mg/dL  Glucose, capillary     Status: Abnormal   Collection Time: 08/08/15  4:53 PM  Result Value Ref Range   Glucose-Capillary 131 (H) 65 - 99 mg/dL  Glucose, capillary     Status: Abnormal   Collection Time: 08/08/15  8:36 PM  Result Value Ref Range   Glucose-Capillary 196 (H) 65 - 99 mg/dL   Comment 1 Notify RN   Glucose, capillary     Status: Abnormal   Collection Time: 08/09/15  6:15 AM  Result Value Ref Range   Glucose-Capillary 165 (H) 65 - 99 mg/dL  Glucose, capillary     Status: Abnormal   Collection Time: 08/09/15 11:42 AM  Result Value Ref Range   Glucose-Capillary 132 (H) 65 - 99 mg/dL    Physical Findings: AIMS: Facial and Oral Movements Muscles of Facial Expression: None, normal Lips and Perioral Area: None, normal Jaw: None, normal Tongue: None, normal,Extremity Movements Upper (arms, wrists, hands, fingers): None, normal Lower (legs, knees, ankles, toes): None, normal, Trunk Movements Neck, shoulders, hips: None, normal, Overall Severity Severity of abnormal movements (highest score from questions above): None, normal Incapacitation due to abnormal movements: None, normal Patient's awareness of abnormal movements (rate only patient's report): No Awareness, Dental Status Current problems with teeth  and/or dentures?: No Does patient usually wear dentures?: No  CIWA:  CIWA-Ar Total: 0 COWS:  COWS Total Score: 0  Musculoskeletal: Strength & Muscle Tone: within normal limits Gait & Station: normal Patient leans: N/A  Psychiatric Specialty Exam: Review of Systems  Psychiatric/Behavioral: Positive for depression and substance abuse. The patient has insomnia.   All other systems reviewed and are negative. nausea, now improved, no vomiting today, no abdominal pain   Blood pressure 138/90, pulse 79, temperature 98.4 F (36.9 C), temperature source Oral, resp. rate 20, height _0  (1.753 m), weight 225 lb (102.059 kg), SpO2 100 %.Body mass index is 33.21 kg/(m^2).  General Appearance: Casual  Eye Contact::   Improved eye contact   Speech:  Normal Rate  Volume:   Normal   Mood:   Mood is improved compared to admission  Affect:  More reactive, less anxious   Thought Process:  Goal Directed  Orientation:  Full (Time, Place, and Person)  Thought Content:  Reports resolving hallucinations, and does not present internally preoccupied or paranoid, no delusions expressed  Suicidal Thoughts:  No currently denies plan or intention of hurting self/suicide   Homicidal Thoughts:  No denies any homicidal or violent ideations at this time  Memory:   Recent and remote grossly intact   Judgement:  Improving   Insight:    Improving   Psychomotor Activity:  Normal  Concentration:  Good  Recall:  Good  Fund of Knowledge:Fair  Language: Good  Akathisia:  No    AIMS (if indicated):     Assets:  Desire for Improvement  ADL's:  Intact  Cognition: WNL  Sleep:  Number of Hours: 6.75   Treatment Plan Summary: Patient improving and today reporting sense of relief and decreased apprehension related to confirming [facility] has been [facility]. Tolerating medications well at this time- no akathisia, no EPS.  No side effects on Zoloft, which has been titrated . As this program is in  [place], patient/CSW currently working on  securing transportation to said program.    Daily contact with patient to  assess and evaluate symptoms and progress in treatment and Medication management  Encourage milileu , group participation to work on coping skills and work on symptom reduction.   Continue  Zoloft   100  mgrs QDAY to address ongoing depression and anxiety Continue  Lamictal 50 mg po qhs for mood lability. Continue Abilify 5 mgrs QDAY for mood disorder, psychosis Continue Ambien  5 mgrs QHS PRN for insomnia as needed  Treatment team working on disposition planning - patient accepted to [facility].  Patient to participate in therapeutic milieu .    Shayna Eblen 08/09/2015 1:06 PM

## 2015-08-09 NOTE — Progress Notes (Signed)
Pt did not attend wrap up group meeting but was invited by the tech.  

## 2015-08-09 NOTE — Progress Notes (Signed)
Patient ID: Andre LefortReginald Kinne, male   DOB: 01/03/1961, 55 y.o.   MRN: [id number]  Adult Psychoeducational Group Note  Date:  08/09/2015 Time:  09:00am  Group Topic/Focus:  Orientation:   The focus of this group is to educate the patient on the purpose and policies of crisis stabilization and provide a format to answer questions about their admission.  The group details unit policies and expectations of patients while admitted.  Participation Level:  Active  Participation Quality:  Appropriate  Affect:  Appropriate  Cognitive:  Appropriate  Insight: Improving  Engagement in Group:  Improving  Modes of Intervention:  Discussion, Education, Orientation and Support  Additional Comments:  Pt able to identify one goal to accomplish today.   Aurora Maskwyman, Stella Encarnacion E 08/09/2015, 10:22 AM

## 2015-08-09 NOTE — [facility] Group Notes (Signed)
[facility] Group Therapy 08/09/2015 1:15pm  Type of Therapy: [employer] Presentation  Participation Level: Active  Participation Quality: Attentive  Affect: Appropriate  Cognitive: Oriented  Insight: Developing/Improving  Engagement in Therapy: Engaged  Modes of Intervention: Discussion, Education and Socialization  Summary of Progress/Problems: Mental [employer] Association (MHA) Speaker came to talk about his personal journey with substance abuse and addiction. The pt processed ways by which to relate to the speaker. MHA speaker provided handouts and educational information pertaining to groups and services offered by the MHA. Pt was engaged in speaker's presentation and was receptive to resources provided.    Tanazia Achee Carter, LCSWA 08/09/2015 1:48 PM  

## 2015-08-09 NOTE — Progress Notes (Signed)
Nutrition Education Note  Pt attended group focusing on general, healthful nutrition education.  RD emphasized the importance of eating regular meals and snacks throughout the day. Consuming sugar-free beverages and incorporating fruits and vegetables into diet when possible. Provided examples of healthy snacks. Patient encouraged to leave group with a goal to improve nutrition/healthy eating.   Diet Order: Diet regular Room service appropriate?: Yes; Fluid consistency:: Thin Pt is also offered choice of unit snacks mid-morning and mid-afternoon.  Pt is eating as desired.   If additional nutrition issues arise, please consult RD.     Charmelle Soh, RD, LDN Inpatient Clinical Dietitian Pager # [phone] After hours/weekend pager # [phone]     

## 2015-08-09 NOTE — Progress Notes (Signed)
Patient ID: Joseph LefortReginald Vasquez, male   DOB: 03/26/1961, 55 y.o.   MRN: [id number] PER STATE REGULATIONS 482.30  THIS CHART WAS REVIEWED FOR MEDICAL NECESSITY WITH RESPECT TO THE PATIENT'S ADMISSION/ DURATION OF STAY.  NEXT REVIEW DATE: 08/13/2015  Willa RoughJENNIFER JONES [name], RN, BSN CASE MANAGER

## 2015-08-10 LAB — GLUCOSE, CAPILLARY: GLUCOSE-CAPILLARY: 144 mg/dL — AB (ref 65–99)

## 2015-08-10 MED ORDER — SIMVASTATIN 40 MG PO TABS: 40.0000 mg | ORAL_TABLET | Freq: Every day | ORAL | Status: DC

## 2015-08-10 MED ORDER — LAMOTRIGINE 25 MG PO TABS: 50.0000 mg | ORAL_TABLET | Freq: Every day | ORAL | Status: DC

## 2015-08-10 MED ORDER — HYDROXYZINE HCL 25 MG PO TABS: 25.0000 mg | ORAL_TABLET | Freq: Four times a day (QID) | ORAL | Status: DC | PRN

## 2015-08-10 MED ORDER — SERTRALINE HCL 100 MG PO TABS: 100.0000 mg | ORAL_TABLET | Freq: Every day | ORAL | Status: DC

## 2015-08-10 MED ORDER — ARIPIPRAZOLE 5 MG PO TABS: 5.0000 mg | ORAL_TABLET | Freq: Every day | ORAL | Status: DC

## 2015-08-10 MED ORDER — METFORMIN HCL 1000 MG PO TABS: 1000.0000 mg | ORAL_TABLET | Freq: Two times a day (BID) | ORAL | Status: DC

## 2015-08-10 MED ORDER — ZOLPIDEM TARTRATE 5 MG PO TABS: 5.0000 mg | ORAL_TABLET | Freq: Every evening | ORAL | Status: DC | PRN

## 2015-08-10 NOTE — Progress Notes (Signed)
D: Patient verbalizes readiness for discharge: Denies SI/HI, is not psychotic or delusional.   A: Discharge instructions read and discussed with and patient. All belongings returned to pt.   R: Pt verbalized understanding of discharge instructions. Signed for return of belongings.   A: Escorted to the lobby.

## 2015-08-10 NOTE — [facility] Suicide Risk Assessment (Signed)
[facility] Discharge Suicide Risk Assessment   Demographic Factors:  55 year old male, divorced, homeless   Total Time spent with patient: 30 minutes  Musculoskeletal: Strength & Muscle Tone: within normal limits Gait & Station: normal Patient leans: N/A  Psychiatric Specialty Exam: Physical Exam  ROS  Blood pressure 138/90, pulse 79, temperature 98.4 F (36.9 C), temperature source Oral, resp. rate 20, height 5\' 9"  (1.753 m), weight 225 lb (102.059 kg), SpO2 100 %.Body mass index is 33.21 kg/(m^2).  General Appearance: Well Groomed  Eye Contact::  Good  Speech:  Normal Rate409  Volume:  Normal  Mood:  improved compared to admission, denies depression at this time  Affect:  Appropriate  Thought Process:  Linear  Orientation:  Full (Time, Place, and Person)  Thought Content:  denies hallucinations, no delusions - at this time does not present internally preoccupied  Suicidal Thoughts:  No  Homicidal Thoughts:  No  Memory:  recent and remote grossly intact   Judgement:  Other:  improved   Insight:  Present  Psychomotor Activity:  Normal  Concentration:  Good  Recall:  Good  Fund of Knowledge:Good  Language: Good  Akathisia:  Negative  Handed:  Right  AIMS (if indicated):     Assets:  Communication Skills Desire for Improvement Resilience  Sleep:  Number of Hours: 6.75  Cognition: WNL  ADL's:  Intact   Have you used any form of tobacco in the last 30 days? (Cigarettes, Smokeless Tobacco, Cigars, and/or Pipes): No  Has this patient used any form of tobacco in the last 30 days? (Cigarettes, Smokeless Tobacco, Cigars, and/or Pipes) No  Mental Status Per Nursing Assessment::   On Admission:     Current Mental Status by Physician: Alert and attentive, well related, calm, at this time mood improved and denies depression, affect reactive, no thought disorder, no SI or HI, no psychotic symptoms, future oriented   Loss Factors: Homelessness, on disability   Historical  Factors: History of Schizoaffective Disorder, history of cocaine dependence, prior psychiatric admissions   Risk Reduction Factors:   Positive coping skills or problem solving skills  Continued Clinical Symptoms:  As noted, currently much improved compared to admission  Cognitive Features That Contribute To Risk:  No gross cognitive deficits noted upon discharge. Is alert , attentive, and oriented x 3   Suicide Risk:  Mild:  Suicidal ideation of limited frequency, intensity, duration, and specificity.  There are no identifiable plans, no associated intent, mild dysphoria and related symptoms, good self-control (both objective and subjective assessment), few other risk factors, and identifiable protective factors, including available and accessible social support.  Principal Problem: Schizoaffective disorder, bipolar type [facility]) Discharge Diagnoses:  Patient Active Problem List   Diagnosis Date Noted  . Cocaine use disorder, moderate, dependence ([facility]) [F14.20] 08/03/2015  . Nausea with vomiting [R11.2] 08/03/2015  . Cannabis use disorder, moderate, dependence ([facility]) [F12.20] 08/03/2015  . Schizoaffective disorder, bipolar type ([facility]) [F25.0] 08/01/2015  . Hyperlipidemia [E78.5] 01/18/2015  . Hyperprolactinemia ([facility]) [E22.1] 01/18/2015  . Diabetes mellitus ([facility]) [E11.9] 03/11/2012  . HTN (hypertension) [I10] 03/11/2012    Follow-up Information    Follow up with [facility] On 08/11/2015.   Why:  Please present to [facility] Saturday (when your bus arrives in [place]) to continue treatment.   Contact information:   [address]) [address] [place], [place] [address] Phone: [phone] Fax: [phone]      Plan Of Care/Follow-up recommendations:  Activity:  as  tolerated  Diet:  Diabetic Diet  Tests:  NA Other:  See below   Is patient on multiple antipsychotic therapies at discharge:  No   Has Patient had three or more failed  trials of antipsychotic monotherapy by history:  No  Recommended Plan for Multiple Antipsychotic Therapies: NA  Patient leaving in good spirits  Plans to go to [facility] [place]- departs via bus later today.   Ahmadou Bolz 08/10/2015, 10:19 AM

## 2015-08-10 NOTE — Discharge Summary (Signed)
Physician Discharge Summary Note  Patient:  Joseph LefortReginald Vasquez is an 55 y.o., male MRN:  [id number] DOB:  07/19/1961 Patient phone:  [phone] (home)  Patient address:   [address] [place] [address]  [place] [address],  Total Time spent with patient: Greater than 30 minutes  Date of Admission:  08/01/2015 Date of Discharge: 08/10/2015  Reason for Admission:  55 year old male. Known to our unit from prior psychiatric admissions- history of Schizoaffective Disorder. States that one of his sisters and an aunt passed away within a period of two weeks , in December.States that even before then was feeling " a little depressed", but after these losses has felt worse.He reports he has had auditory hallucinations, " on and off ", which sometimes tell him to hurt himself. States he started to feel suicidal although denies any specific plan and states "I knew I had to come in to the hospital". InED, he also reported homicidal ideations with a thought of killing " gang members". At this time denies any homicidal ideations. He has a history of Cocaine and Cannabis Abuse, and states he had been sober for a period of months, until about 7 days ago, when he relapsed x 1 day. Admission UDS is positive for cannabis and for cocaine. Of note, denies alcohol abuse.   Principal Problem: Schizoaffective disorder, bipolar type [facility](HCC)  Discharge Diagnoses: Patient Active Problem List   Diagnosis Date Noted  . Cocaine use disorder, moderate, dependence (HCC) [F14.20] 08/03/2015  . Nausea with vomiting [R11.2] 08/03/2015  . Cannabis use disorder, moderate, dependence (HCC) [F12.20] 08/03/2015  . Schizoaffective disorder, bipolar type (HCC) [F25.0] 08/01/2015  . Hyperlipidemia [E78.5] 01/18/2015  . Hyperprolactinemia (HCC) [E22.1] 01/18/2015  . Diabetes mellitus (HCC) [E11.9] 03/11/2012  . HTN (hypertension) [I10] 03/11/2012   Musculoskeletal: Strength & Muscle Tone: within normal limits Gait & Station: normal Patient  leans: N/A  Psychiatric Specialty Exam: Physical Exam  Psychiatric: His speech is normal and behavior is normal. Judgment and thought content normal. His mood appears not anxious. His affect is not angry, not blunt, not labile and not inappropriate. Cognition and memory are normal. He does not exhibit a depressed mood.    Review of Systems  Constitutional: Negative.   HENT: Negative.   Eyes: Negative.   Respiratory: Negative.   Cardiovascular: Negative.   Gastrointestinal: Negative.   Genitourinary: Negative.   Musculoskeletal: Negative.   Skin: Negative.   Neurological: Negative.   Endo/Heme/Allergies: Negative.   Psychiatric/Behavioral: Positive for depression (stable) and substance abuse (Cocaine use disorder). Negative for suicidal ideas, hallucinations (Hx of) and memory loss. The patient has insomnia (Stable). The patient is not nervous/anxious.     Blood pressure 138/90, pulse 79, temperature 98.4 F (36.9 C), temperature source Oral, resp. rate 20, height 5\' 9"  (1.753 m), weight 102.059 kg (225 lb), SpO2 100 %.Body mass index is 33.21 kg/(m^2).  See MD SRA   Have you used any form of tobacco in the last 30 days? (Cigarettes, Smokeless Tobacco, Cigars, and/or Pipes): No  Has this patient used any form of tobacco in the last 30 days? (Cigarettes, Smokeless Tobacco, Cigars, and/or Pipes): Declines this assessment upon discharge. Past Medical History:  Past Medical History  Diagnosis Date  . Diabetes mellitus   . Bipolar affective disorder (HCC)   . Schizophrenia, schizo-affective (HCC)   . Hypertension   . Diabetes mellitus (HCC) 03/11/2012  . Bipolar disorder (HCC) 03/11/2012  . Anxiety   . Depression   . Hepatitis 06/30/2013  Type C    Past Surgical History  Procedure Laterality Date  . Finger surgery    . Wisdom tooth extraction     Family History:  Family History  Problem Relation Age of Onset  . Diabetes Mother   . Bipolar disorder Sister    Social History:   History  Alcohol Use No     History  Drug Use  . Yes  . Special: Marijuana, Cocaine    Comment: 4 days ago last THC and cocoaine use... relapsed after 2 yrs     Social History   Social History  . Marital Status: Single    Spouse Name: N/A  . Number of Children: N/A  . Years of Education: N/A   Social History Main Topics  . Smoking status: Never Smoker   . Smokeless tobacco: Never Used  . Alcohol Use: No  . Drug Use: Yes    Special: Marijuana, Cocaine     Comment: 4 days ago last THC and cocoaine use... relapsed after 2 yrs   . Sexual Activity: Yes    Birth Control/ Protection: Condom     Comment: marijuana 25 days ago   Other Topics Concern  . None   Social History Narrative   Risk to Self: Is patient at risk for suicide?: Yes Risk to Others: No Prior Inpatient Therapy: Yes Prior Outpatient Therapy: Yes  Level of Care:  OP  Hospital Course:   [name] was admitted for Schizoaffective disorder, bipolar type (HCC) , with crisis management.  Pt was treated discharged with the medications listed below under Medication List.  Medical problems were identified and treated as needed.  Home medications were restarted as appropriate.  Improvement was monitored by observation and [name] 's daily report of symptom reduction.  Emotional and mental status was monitored by daily self-inventory reports completed by [name] and clinical staff.         Hance Caspers was evaluated by the treatment team for stability and plans for continued recovery upon discharge. Ankur Snowdon 's motivation was an integral factor for scheduling further treatment. Employment, transportation, bed availability, health status, family support, and any pending legal issues were also considered during hospital stay. Pt was offered further treatment options upon discharge including but not limited to Residential, Intensive Outpatient, and Outpatient treatment.  Linas Stepter will follow  up with the services as listed below under Follow Up Information.     Upon completion of this admission the patient was both mentally and medically stable for discharge denying suicidal/homicidal ideation, auditory/visual/tactile hallucinations, delusional thoughts and paranoia.    [name] responded well to treatment with Abilify, Vistaril, Lamictal, Zoloft, and Ambien without adverse effects. Pt demonstrated improvement without reported or observed adverse effects to the point of stability appropriate for outpatient management. Pertinent labs include: UDS+ cocaine/THC  , Elevated triglycerides. Reviewed CBC, CMP, BAL, and UDS; all unremarkable aside from noted exceptions.   Discharge Vitals:   Blood pressure 138/90, pulse 79, temperature 98.4 F (36.9 C), temperature source Oral, resp. rate 20, height 5\' 9"  (1.753 m), weight 102.059 kg (225 lb), SpO2 100 %. Body mass index is 33.21 kg/(m^2). Lab Results:   Results for orders placed or performed during the hospital encounter of 08/01/15 (from the past 72 hour(s))  Glucose, capillary     Status: Abnormal   Collection Time: 08/07/15 11:47 AM  Result Value Ref Range   Glucose-Capillary 126 (H) 65 - 99 mg/dL  Glucose, capillary  Status: Abnormal   Collection Time: 08/07/15  5:00 PM  Result Value Ref Range   Glucose-Capillary 168 (H) 65 - 99 mg/dL  Glucose, capillary     Status: Abnormal   Collection Time: 08/08/15  6:09 AM  Result Value Ref Range   Glucose-Capillary 178 (H) 65 - 99 mg/dL  Glucose, capillary     Status: Abnormal   Collection Time: 08/08/15 11:44 AM  Result Value Ref Range   Glucose-Capillary 149 (H) 65 - 99 mg/dL  Glucose, capillary     Status: Abnormal   Collection Time: 08/08/15  4:53 PM  Result Value Ref Range   Glucose-Capillary 131 (H) 65 - 99 mg/dL  Glucose, capillary     Status: Abnormal   Collection Time: 08/08/15  8:36 PM  Result Value Ref Range   Glucose-Capillary 196 (H) 65 - 99 mg/dL   Comment 1  Notify RN   Glucose, capillary     Status: Abnormal   Collection Time: 08/09/15  6:15 AM  Result Value Ref Range   Glucose-Capillary 165 (H) 65 - 99 mg/dL  Glucose, capillary     Status: Abnormal   Collection Time: 08/09/15 11:42 AM  Result Value Ref Range   Glucose-Capillary 132 (H) 65 - 99 mg/dL  Glucose, capillary     Status: Abnormal   Collection Time: 08/09/15  4:35 PM  Result Value Ref Range   Glucose-Capillary 210 (H) 65 - 99 mg/dL  Glucose, capillary     Status: None   Collection Time: 08/09/15  8:28 PM  Result Value Ref Range   Glucose-Capillary 94 65 - 99 mg/dL   Comment 1 Notify RN   Glucose, capillary     Status: Abnormal   Collection Time: 08/10/15  6:15 AM  Result Value Ref Range   Glucose-Capillary 144 (H) 65 - 99 mg/dL   Physical Findings: AIMS: Facial and Oral Movements Muscles of Facial Expression: None, normal Lips and Perioral Area: None, normal Jaw: None, normal Tongue: None, normal,Extremity Movements Upper (arms, wrists, hands, fingers): None, normal Lower (legs, knees, ankles, toes): None, normal, Trunk Movements Neck, shoulders, hips: None, normal, Overall Severity Severity of abnormal movements (highest score from questions above): None, normal Incapacitation due to abnormal movements: None, normal Patient's awareness of abnormal movements (rate only patient's report): No Awareness, Dental Status Current problems with teeth and/or dentures?: No Does patient usually wear dentures?: No  CIWA:  CIWA-Ar Total: 0 COWS:  COWS Total Score: 0  See Psychiatric Specialty Exam and Suicide Risk Assessment completed by Attending Physician prior to discharge.  Discharge destination:  Home  Is patient on multiple antipsychotic therapies at discharge:  No   Has Patient had three or more failed trials of antipsychotic monotherapy by history:  No  Recommended Plan for Multiple Antipsychotic Therapies: NA    Medication List    STOP taking these medications         amitriptyline 25 MG tablet  Commonly known as:  ELAVIL     benztropine 0.5 MG tablet  Commonly known as:  COGENTIN     loratadine 10 MG tablet  Commonly known as:  CLARITIN      TAKE these medications      Indication   ARIPiprazole 5 MG tablet  Commonly known as:  ABILIFY  Take 1 tablet (5 mg total) by mouth daily.   Indication:  mood stabilization     glipiZIDE 10 MG tablet  Commonly known as:  GLUCOTROL  Take 1 tablet (10 mg total)  by mouth 2 (two) times daily before a meal. For diabetes management   Indication:  Type 2 Diabetes     hydrOXYzine 25 MG tablet  Commonly known as:  ATARAX/VISTARIL  Take 1 tablet (25 mg total) by mouth every 6 (six) hours as needed for anxiety.   Indication:  Anxiety Neurosis     insulin aspart 100 UNIT/ML FlexPen  Commonly known as:  NOVOLOG FLEXPEN  Inject 3-10 Units into the skin 3 (three) times daily with meals. Per sliding scale: For diabetes management   Indication:  Type 2 Diabetes     insulin detemir 100 UNIT/ML injection  Commonly known as:  LEVEMIR  Inject 0.4 mLs (40 Units total) into the skin at bedtime. For diabetes management   Indication:  Type 2 Diabetes     lamoTRIgine 25 MG tablet  Commonly known as:  LAMICTAL  Take 2 tablets (50 mg total) by mouth at bedtime.   Indication:  mood stabilization     metFORMIN 1000 MG tablet  Commonly known as:  GLUCOPHAGE  Take 1 tablet (1,000 mg total) by mouth 2 (two) times daily with a meal. For diabetes management   Indication:  Type 2 Diabetes     sertraline 100 MG tablet  Commonly known as:  ZOLOFT  Take 1 tablet (100 mg total) by mouth daily.   Indication:  Major Depressive Disorder     simvastatin 40 MG tablet  Commonly known as:  ZOCOR  Take 1 tablet (40 mg total) by mouth daily at 6 PM.   Indication:  hyperlipidemia     zolpidem 5 MG tablet  Commonly known as:  AMBIEN  Take 1 tablet (5 mg total) by mouth at bedtime as needed for sleep.   Indication:  Trouble  Sleeping       Follow-up Information    Follow up with [facility] On 08/11/2015.   Why:  Please present to [facility] on Saturday (when your bus arrives in [place]) to continue treatment.   Contact information:   [address] [place], [place] [address] Phone: [phone] Fax: [phone]     Follow-up recommendations: Activity:  As tolerated Diet: As recommended by your primary care doctor. Keep all scheduled follow-up appointments as recommended.   Comments: Take all your medications as prescribed by your mental healthcare provider. Report any adverse effects and or reactions from your medicines to your outpatient provider promptly. Patient is instructed and cautioned to not engage in alcohol and or illegal drug use while on prescription medicines. In the event of worsening symptoms, patient is instructed to call the crisis hotline, 911 and or go to the nearest ED for appropriate evaluation and treatment of symptoms. Follow-up with your primary care provider for your other medical issues, concerns and or health care needs.   Total Discharge Time: Greater than 30 minutes  Signed: Beau Fanny, FNP-BC 08/10/2015, 10:00 AM   Patient seen, Suicide Assessment Completed.  Disposition Plan Reviewed

## 2015-08-10 NOTE — Progress Notes (Signed)
  [facility] Discharge Plan :  Will you be returning to the same living situation after discharge:  No.  Pt is going to [facility] At discharge, do you have transportation home?: Yes,  Pt provided with bus pass Do you have the ability to pay for your medications: Yes,  Pt provided with prescriptions   Release of information consent forms completed and in the chart;  Patient's signature needed at discharge.  Patient to Follow up at: Follow-up Information    Follow up with [facility] On 08/11/2015.   Why:  Please present to [facility] on Saturday (when your bus arrives in [place]) to continue treatment.   Contact information:   [address] [place], [place] [id number] Phone: [phone] Fax: [phone]      Next level of care provider has access to [facility] Link:no  Safety Planning and Suicide Prevention discussed: Yes, with Pt; declines family contact  Have you used any form of tobacco in the last 30 days? (Cigarettes, Smokeless Tobacco, Cigars, and/or Pipes): No  Has patient been referred to the Quitline?: N/A patient is not a smoker  Patient has been referred for addiction treatment: Yes- see above  Elaina HoopsCarter, Jashawna Reever M 08/10/2015, 10:40 AM

## 2015-08-10 NOTE — Tx Team (Addendum)
Interdisciplinary Treatment Plan Update (Adult) Date: 08/10/2015   Date: 08/10/2015 10:34 AM  Progress in Treatment:  Attending groups: Yes  Participating in groups: Yes  Taking medication as prescribed: Yes  Tolerating medication: Yes  Family/Significant othe contact made: No, Pt declines Patient understands diagnosis:  Discussing patient identified problems/goals with staff: Yes  Medical problems stabilized or resolved: Yes  Denies suicidal/homicidal ideation: Yes Patient has not harmed self or Others: Yes   New problem(s) identified: None identified at this time.   Discharge Plan or Barriers: Pt will return to [facility] and follow-up with outpatient resources  08/07/15: Pt has [facility] to [facility]; phone interview on 1/11  08/10/15: Pt discharging to [facility]  Additional comments:  Patient and CSW reviewed pt's identified goals and treatment plan. Patient verbalized understanding and agreed to treatment plan. CSW reviewed [facility] "Discharge Process and Patient Involvement" Form. Pt verbalized understanding of information provided and signed form.   Reason for Continuation of Hospitalization:  Depression Medication stabilization Suicidal ideation Withdrawal symptoms Auditory Hallucinations  Estimated length of stay: 0 days; Pt stable for DC  Review of initial/current patient goals per problem list:   1.  Goal(s): Patient will participate in aftercare plan  Met:  Yes  Target date: 3-5 days from date of admission   As evidenced by: Patient will participate within aftercare plan AEB aftercare provider and housing plan at discharge being identified.   08/02/15: Pt will return to [facility] and follow-up with outpatient resources  08/07/15: Pt is now requesting to go to [facility]; phone interview on 1/11 at 2pm  08/10/15: Pt accepted for admission on 1/14.  2.  Goal (s): Patient will exhibit decreased depressive symptoms and suicidal  ideations.  Met:  Adequate for DC  Target date: 3-5 days from date of admission   As evidenced by: Patient will utilize self rating of depression at 3 or below and demonstrate decreased signs of depression or be deemed stable for discharge by MD. 08/02/15: Pt was admitted with symptoms of depression, rating 10/10. Pt continues to present with flat affect and depressive symptoms.  Pt will demonstrate decreased symptoms of depression and rate depression at 3/10 or lower prior to discharge. 08/07/15: Pt continues to rate depression at high rates; endorses passive SI "off and on" 08/10/15: MD feels that Pt's symptoms have decreased to the point that they can be managed in an outpatient setting.   4.  Goal(s): Patient will demonstrate decreased signs of withdrawal due to substance abuse  Met:  Yes  Target date: 3-5 days from date of admission   As evidenced by: Patient will produce a CIWA/COWS score of 0, have stable vitals signs, and no symptoms of withdrawal  08/02/15: Pt has COWS score of 4; endorses restlessness, GI upset, and anxiety as symptoms of withdrawal.  08/07/15: Pt has COWS score of 1/10, however anxiety is sole symptom Pt reports. No longer on protocol. 5.  Goal(s): Patient will demonstrate decreased signs of psychosis  . Met:  Yes . Target date: 3-5 days from date of admission  . As evidenced by: Patient will demonstrate decreased frequency of AVH or return to baseline function    -08/02/15: Pt recently admitted with    command hallucinations to harm    himself and others.     -08/07/15: Continues to endorse AH.    -08/10/15: Pt denies AVH.   Attendees:  Patient:    Family:    Physician: Dr. Parke Poisson, MD  08/10/2015 10:34  AM  Nursing: Lars Pinks, RN Case manager  08/10/2015 10:34 AM  Clinical Social Worker Peri Maris, [place] 08/10/2015 10:34 AM  Other: Tilden Fossa, [place] 08/10/2015 10:34 AM  Clinical:  Lise Auer, RN  08/10/2015 10:34 AM  Other: , RN Charge Nurse  08/10/2015 10:34 AM  Other: Hilda Lias, [place], [place] Social Work [phone]

## 2015-08-10 NOTE — Progress Notes (Signed)
Recreation Therapy Notes  Date: 01.13.2017  Time: 9:30am Location: 300 Group Room   Group Topic: Stress Management  Goal Area(s) Addresses:  Patient will actively participate in stress management techniques presented during session.   Behavioral Response: Did not attend.   Casanova Schurman L Chrles Selley, LRT/CTRS        Eilan Mcinerny L 08/10/2015 4:03 PM 

## 2016-02-04 ENCOUNTER — Encounter (HOSPITAL_COMMUNITY): Payer: Self-pay | Admitting: *Deleted

## 2016-02-04 DIAGNOSIS — I10 Essential (primary) hypertension: Secondary | ICD-10-CM | POA: Diagnosis not present

## 2016-02-04 DIAGNOSIS — Z794 Long term (current) use of insulin: Secondary | ICD-10-CM | POA: Diagnosis not present

## 2016-02-04 DIAGNOSIS — R45851 Suicidal ideations: Secondary | ICD-10-CM | POA: Diagnosis present

## 2016-02-04 DIAGNOSIS — Z7984 Long term (current) use of oral hypoglycemic drugs: Secondary | ICD-10-CM | POA: Diagnosis not present

## 2016-02-04 DIAGNOSIS — E119 Type 2 diabetes mellitus without complications: Secondary | ICD-10-CM | POA: Insufficient documentation

## 2016-02-04 DIAGNOSIS — F200 Paranoid schizophrenia: Secondary | ICD-10-CM | POA: Insufficient documentation

## 2016-02-04 DIAGNOSIS — R45850 Homicidal ideations: Secondary | ICD-10-CM | POA: Insufficient documentation

## 2016-02-04 DIAGNOSIS — Z79899 Other long term (current) drug therapy: Secondary | ICD-10-CM | POA: Diagnosis not present

## 2016-02-04 LAB — RAPID URINE DRUG SCREEN, HOSP PERFORMED
Amphetamines: NOT DETECTED
BARBITURATES: NOT DETECTED
Benzodiazepines: NOT DETECTED
COCAINE: POSITIVE — AB
Opiates: NOT DETECTED
TETRAHYDROCANNABINOL: POSITIVE — AB

## 2016-02-04 LAB — COMPREHENSIVE METABOLIC PANEL
ALBUMIN: 3.4 g/dL — AB (ref 3.5–5.0)
ALK PHOS: 68 U/L (ref 38–126)
ALT: 18 U/L (ref 17–63)
AST: 22 U/L (ref 15–41)
Anion gap: 9 (ref 5–15)
BILIRUBIN TOTAL: 0.9 mg/dL (ref 0.3–1.2)
BUN: 13 mg/dL (ref 6–20)
CALCIUM: 9.6 mg/dL (ref 8.9–10.3)
CO2: 25 mmol/L (ref 22–32)
CREATININE: 1.16 mg/dL (ref 0.61–1.24)
Chloride: 99 mmol/L — ABNORMAL LOW (ref 101–111)
GFR calc Af Amer: >60 mL/min (ref >60)
GFR calc non Af Amer: >60 mL/min (ref >60)
GLUCOSE: 393 mg/dL — AB (ref 65–99)
Potassium: 3.9 mmol/L (ref 3.5–5.1)
SODIUM: 133 mmol/L — AB (ref 135–145)
TOTAL PROTEIN: 7 g/dL (ref 6.5–8.1)

## 2016-02-04 LAB — CBC
HEMATOCRIT: 38.5 % — AB (ref 39.0–52.0)
HEMOGLOBIN: 12.7 g/dL — AB (ref 13.0–17.0)
MCH: 28.5 pg (ref 26.0–34.0)
MCHC: 33 g/dL (ref 30.0–36.0)
MCV: 86.3 fL (ref 78.0–100.0)
Platelets: 281 10*3/uL (ref 150–400)
RBC: 4.46 MIL/uL (ref 4.22–5.81)
RDW: 12.8 % (ref 11.5–15.5)
WBC: 8.1 10*3/uL (ref 4.0–10.5)

## 2016-02-04 LAB — ETHANOL: Alcohol, Ethyl (B): <5 mg/dL (ref <5)

## 2016-02-04 LAB — SALICYLATE LEVEL: Salicylate Lvl: <4 mg/dL (ref 2.8–30.0)

## 2016-02-04 LAB — ACETAMINOPHEN LEVEL

## 2016-02-04 NOTE — ED Notes (Signed)
Pt reports having suicidal thoughts for five days. Pt states someone just recently killed his stepson and now he wants to shoot himself in the head. Pt endorses visual and auditory hallucinations telling hi to himself and others. Pt states he smokes some marijuana and snorted cocaine four days ago, denies ETOH.

## 2016-02-05 ENCOUNTER — Inpatient Hospital Stay
Admission: EM | Admit: 2016-02-05 | Discharge: 2016-02-08 | DRG: 885 | Disposition: A | Discharge status: Home / Self Care | Payer: Medicare Other | Source: Intra-hospital | Attending: Psychiatry | Admitting: Psychiatry

## 2016-02-05 ENCOUNTER — Emergency Department (HOSPITAL_COMMUNITY)
Admission: EM | Admit: 2016-02-05 | Discharge: 2016-02-05 | Disposition: A | Discharge status: Home / Self Care | Payer: Medicare Other | Attending: Emergency Medicine | Admitting: Emergency Medicine

## 2016-02-05 DIAGNOSIS — E785 Hyperlipidemia, unspecified: Secondary | ICD-10-CM | POA: Diagnosis present

## 2016-02-05 DIAGNOSIS — E7800 Pure hypercholesterolemia, unspecified: Secondary | ICD-10-CM | POA: Diagnosis present

## 2016-02-05 DIAGNOSIS — Z794 Long term (current) use of insulin: Secondary | ICD-10-CM | POA: Diagnosis not present

## 2016-02-05 DIAGNOSIS — Z9119 Patient's noncompliance with other medical treatment and regimen: Secondary | ICD-10-CM | POA: Diagnosis not present

## 2016-02-05 DIAGNOSIS — G4700 Insomnia, unspecified: Secondary | ICD-10-CM | POA: Diagnosis present

## 2016-02-05 DIAGNOSIS — Z818 Family history of other mental and behavioral disorders: Secondary | ICD-10-CM

## 2016-02-05 DIAGNOSIS — E119 Type 2 diabetes mellitus without complications: Secondary | ICD-10-CM | POA: Diagnosis present

## 2016-02-05 DIAGNOSIS — Z888 Allergy status to other drugs, medicaments and biological substances status: Secondary | ICD-10-CM | POA: Diagnosis not present

## 2016-02-05 DIAGNOSIS — Z9889 Other specified postprocedural states: Secondary | ICD-10-CM | POA: Diagnosis not present

## 2016-02-05 DIAGNOSIS — Z833 Family history of diabetes mellitus: Secondary | ICD-10-CM

## 2016-02-05 DIAGNOSIS — Z915 Personal history of self-harm: Secondary | ICD-10-CM

## 2016-02-05 DIAGNOSIS — R45850 Homicidal ideations: Secondary | ICD-10-CM | POA: Diagnosis present

## 2016-02-05 DIAGNOSIS — Z7984 Long term (current) use of oral hypoglycemic drugs: Secondary | ICD-10-CM | POA: Diagnosis not present

## 2016-02-05 DIAGNOSIS — F250 Schizoaffective disorder, bipolar type: Secondary | ICD-10-CM | POA: Diagnosis present

## 2016-02-05 DIAGNOSIS — I10 Essential (primary) hypertension: Secondary | ICD-10-CM | POA: Diagnosis present

## 2016-02-05 DIAGNOSIS — R45851 Suicidal ideations: Secondary | ICD-10-CM

## 2016-02-05 DIAGNOSIS — Z79899 Other long term (current) drug therapy: Secondary | ICD-10-CM | POA: Diagnosis not present

## 2016-02-05 DIAGNOSIS — F200 Paranoid schizophrenia: Secondary | ICD-10-CM | POA: Diagnosis not present

## 2016-02-05 DIAGNOSIS — Z590 Homelessness: Secondary | ICD-10-CM | POA: Diagnosis not present

## 2016-02-05 DIAGNOSIS — F1220 Cannabis dependence, uncomplicated: Secondary | ICD-10-CM | POA: Diagnosis present

## 2016-02-05 LAB — CBG MONITORING, ED
GLUCOSE-CAPILLARY: 314 mg/dL — AB (ref 65–99)
GLUCOSE-CAPILLARY: 250 mg/dL — AB (ref 65–99)
GLUCOSE-CAPILLARY: 262 mg/dL — AB (ref 65–99)
Glucose-Capillary: 343 mg/dL — ABNORMAL HIGH (ref 65–99)

## 2016-02-05 LAB — GLUCOSE, CAPILLARY: Glucose-Capillary: 213 mg/dL — ABNORMAL HIGH (ref 65–99)

## 2016-02-05 MED ORDER — ZOLPIDEM TARTRATE 5 MG PO TABS: 5.0000 mg | ORAL_TABLET | Freq: Every evening | ORAL | Status: DC | PRN

## 2016-02-05 MED ORDER — INSULIN DETEMIR 100 UNIT/ML ~~LOC~~ SOLN
40.0000 [IU] | Freq: Every day | SUBCUTANEOUS | Status: DC
  Administered 2016-02-05: 40 [IU] via SUBCUTANEOUS
  Filled 2016-02-05 (×3): qty 0.4

## 2016-02-05 MED ORDER — INSULIN ASPART 100 UNIT/ML ~~LOC~~ SOLN
0.0000 [IU] | Freq: Three times a day (TID) | SUBCUTANEOUS | Status: DC
  Administered 2016-02-06 – 2016-02-07 (×5): 5 [IU] via SUBCUTANEOUS
  Administered 2016-02-07: 3 [IU] via SUBCUTANEOUS
  Administered 2016-02-08: 2 [IU] via SUBCUTANEOUS
  Administered 2016-02-08: 5 [IU] via SUBCUTANEOUS
  Filled 2016-02-05 (×2): qty 5
  Filled 2016-02-05: qty 2
  Filled 2016-02-05 (×3): qty 5
  Filled 2016-02-05: qty 3

## 2016-02-05 MED ORDER — TRAZODONE HCL 50 MG PO TABS: 50.0000 mg | ORAL_TABLET | Freq: Every evening | ORAL | Status: DC | PRN

## 2016-02-05 MED ORDER — INSULIN ASPART 100 UNIT/ML ~~LOC~~ SOLN
0.0000 [IU] | Freq: Every day | SUBCUTANEOUS | Status: DC
Start: 2016-02-05 — End: 2016-02-08
  Administered 2016-02-05 – 2016-02-07 (×2): 2 [IU] via SUBCUTANEOUS
  Filled 2016-02-05: qty 5
  Filled 2016-02-05: qty 2
  Filled 2016-02-05: qty 5

## 2016-02-05 MED ORDER — ACETAMINOPHEN 325 MG PO TABS
650.0000 mg | ORAL_TABLET | Freq: Four times a day (QID) | ORAL | Status: DC | PRN
  Administered 2016-02-07: 650 mg via ORAL
  Filled 2016-02-05: qty 2

## 2016-02-05 MED ORDER — MAGNESIUM HYDROXIDE 400 MG/5ML PO SUSP: 30.0000 mL | Freq: Every day | ORAL | Status: DC | PRN

## 2016-02-05 MED ORDER — INSULIN DETEMIR 100 UNIT/ML ~~LOC~~ SOLN
40.0000 [IU] | Freq: Every day | SUBCUTANEOUS | Status: DC
  Filled 2016-02-05: qty 0.4

## 2016-02-05 MED ORDER — INSULIN ASPART 100 UNIT/ML ~~LOC~~ SOLN
0.0000 [IU] | Freq: Three times a day (TID) | SUBCUTANEOUS | Status: DC
  Administered 2016-02-05: 7 [IU] via SUBCUTANEOUS
  Administered 2016-02-05: 3 [IU] via SUBCUTANEOUS
  Administered 2016-02-05: 5 [IU] via SUBCUTANEOUS
  Filled 2016-02-05 (×3): qty 1

## 2016-02-05 MED ORDER — ALUM & MAG HYDROXIDE-SIMETH 200-200-20 MG/5ML PO SUSP: 30.0000 mL | ORAL | Status: DC | PRN

## 2016-02-05 MED ORDER — METFORMIN HCL 500 MG PO TABS
1000.0000 mg | ORAL_TABLET | Freq: Two times a day (BID) | ORAL | Status: DC
  Administered 2016-02-06 – 2016-02-08 (×5): 1000 mg via ORAL
  Filled 2016-02-05 (×5): qty 2

## 2016-02-05 MED ORDER — GLIPIZIDE 10 MG PO TABS
10.0000 mg | ORAL_TABLET | Freq: Two times a day (BID) | ORAL | Status: DC
  Administered 2016-02-05: 10 mg via ORAL
  Filled 2016-02-05 (×5): qty 1

## 2016-02-05 MED ORDER — GLIPIZIDE 10 MG PO TABS
10.0000 mg | ORAL_TABLET | Freq: Two times a day (BID) | ORAL | Status: DC
  Administered 2016-02-06 – 2016-02-08 (×5): 10 mg via ORAL
  Filled 2016-02-05 (×7): qty 1

## 2016-02-05 MED ORDER — SERTRALINE HCL 50 MG PO TABS
100.0000 mg | ORAL_TABLET | Freq: Every day | ORAL | Status: DC
  Administered 2016-02-05: 100 mg via ORAL
  Filled 2016-02-05: qty 2

## 2016-02-05 MED ORDER — SIMVASTATIN 40 MG PO TABS
40.0000 mg | ORAL_TABLET | Freq: Every day | ORAL | Status: DC
  Administered 2016-02-05: 40 mg via ORAL
  Filled 2016-02-05: qty 1

## 2016-02-05 MED ORDER — SIMVASTATIN 10 MG PO TABS
40.0000 mg | ORAL_TABLET | Freq: Every day | ORAL | Status: DC
  Administered 2016-02-06 – 2016-02-07 (×2): 40 mg via ORAL
  Filled 2016-02-05 (×2): qty 4

## 2016-02-05 MED ORDER — ARIPIPRAZOLE 5 MG PO TABS
5.0000 mg | ORAL_TABLET | Freq: Every day | ORAL | Status: DC
  Administered 2016-02-06 – 2016-02-07 (×2): 5 mg via ORAL
  Filled 2016-02-05 (×2): qty 1

## 2016-02-05 MED ORDER — INSULIN ASPART 100 UNIT/ML FLEXPEN: 3.0000 [IU] | PEN_INJECTOR | Freq: Three times a day (TID) | SUBCUTANEOUS | Status: DC

## 2016-02-05 MED ORDER — INSULIN ASPART 100 UNIT/ML ~~LOC~~ SOLN: 0.0000 [IU] | Freq: Three times a day (TID) | SUBCUTANEOUS | Status: DC

## 2016-02-05 MED ORDER — ARIPIPRAZOLE 5 MG PO TABS
5.0000 mg | ORAL_TABLET | Freq: Every day | ORAL | Status: DC
  Administered 2016-02-05: 5 mg via ORAL
  Filled 2016-02-05: qty 1

## 2016-02-05 MED ORDER — METFORMIN HCL 500 MG PO TABS
1000.0000 mg | ORAL_TABLET | Freq: Two times a day (BID) | ORAL | Status: DC
  Administered 2016-02-05 (×2): 1000 mg via ORAL
  Filled 2016-02-05 (×2): qty 2

## 2016-02-05 NOTE — ED Notes (Signed)
Report given to Dominga FerryJ Branch, Consulting civil engineerCharge RN.

## 2016-02-05 NOTE — ED Notes (Signed)
Pt ambulatory to Pod C w/RN and sitter. Pt's belongings 1 labeled belongings bag placed at nurses' desk for inventory.

## 2016-02-05 NOTE — Progress Notes (Signed)
[facility] has been [facility].  Patient assigned to room 306 Accepting physician is Dr. Ardyth HarpsHernandez  Call report to [phone].  Pt nurse Beck at [facility] has been informed. Anjeli Casad K. Sherlon HandingHarris, LCAS-A, LPC-A, [facility]  Counselor 02/05/2016 4:04 PM

## 2016-02-05 NOTE — Tx Team (Signed)
Initial Interdisciplinary Treatment Plan   PATIENT STRESSORS: Loss of step son Substance abuse   PATIENT STRENGTHS: Ability for insight Average or above average intelligence   PROBLEM LIST: Problem List/Patient Goals Date to be addressed Date deferred Reason deferred Estimated date of resolution  depression 02/05/2016     Grief 02/05/2016                                                DISCHARGE CRITERIA:  Improved stabilization in mood, thinking, and/or behavior  PRELIMINARY DISCHARGE PLAN: Placement in alternative living arrangements  PATIENT/FAMIILY INVOLVEMENT: This treatment plan has been presented to and reviewed with the patient, Andre LefortReginald Mikami, and/or family member.  The patient and family have been given the opportunity to ask questions and make suggestions.  Foster Simpsonrina Kerstie Agent 02/05/2016, 11:13 PM

## 2016-02-05 NOTE — ED Notes (Signed)
Shower supplies given

## 2016-02-05 NOTE — ED Notes (Signed)
Pt taking a shower linen on bed changed

## 2016-02-05 NOTE — ED Notes (Signed)
Attempted to [facility] - advised will return call.

## 2016-02-05 NOTE — ED Notes (Signed)
Pt has finished his shower and now resting in bed. Sitter at bedside

## 2016-02-05 NOTE — ED Notes (Signed)
Pt using the bathroom

## 2016-02-05 NOTE — ED Notes (Signed)
Maureen RalphsAdvised Casey at [facility] w/[facility].

## 2016-02-05 NOTE — ED Notes (Signed)
TTS in progress 

## 2016-02-05 NOTE — ED Notes (Addendum)
Dinner has arrived pt begining to eat.

## 2016-02-05 NOTE — ED Provider Notes (Signed)
CSN: [id number]     Arrival date & time 02/04/16  2045 History   First MD Initiated Contact with Patient 02/05/16 0045     Chief Complaint  Patient presents with  . Suicidal     (Consider location/radiation/quality/duration/timing/severity/associated sxs/prior Treatment) HPI Comments: 55 year old male with history of diabetes mellitus, bipolar affective disorder, schizoaffective schizophrenia, hypertension, and hepatitis presents to the emergency department for medical clearance and psychiatric evaluation. Patient states that he has been having suicidal thoughts over the past 5 days. He states that these thoughts have worsened since his stepson passed away. He has plans to shoot himself with a gun. He states that he had prior access to a firearm, but his cousin took it away from him. He is also been experiencing auditory and visual hallucinations telling him to hurt himself and others. Patient states that he has been compliant with his psychiatric medications. He last used cocaine 4 days ago.  The history is provided by the patient. No language interpreter was used.    Past Medical History  Diagnosis Date  . Diabetes mellitus   . Bipolar affective disorder (HCC)   . Schizophrenia, schizo-affective (HCC)   . Hypertension   . Diabetes mellitus (HCC) 03/11/2012  . Bipolar disorder (HCC) 03/11/2012  . Anxiety   . Depression   . Hepatitis 06/30/2013    Type C   Past Surgical History  Procedure Laterality Date  . Finger surgery    . Wisdom tooth extraction     Family History  Problem Relation Age of Onset  . Diabetes Mother   . Bipolar disorder Sister    Social History  Substance Use Topics  . Smoking status: Never Smoker   . Smokeless tobacco: Never Used  . Alcohol Use: No    Review of Systems  Psychiatric/Behavioral: Positive for suicidal ideas, hallucinations and behavioral problems.  All other systems reviewed and are negative.   Allergies  Wellbutrin  Home Medications    Prior to Admission medications   Medication Sig Start Date End Date Taking? Authorizing Provider  ARIPiprazole (ABILIFY) 5 MG tablet Take 1 tablet (5 mg total) by mouth daily. 08/10/15  Yes Beau Fanny, FNP  glipiZIDE (GLUCOTROL) 10 MG tablet Take 1 tablet (10 mg total) by mouth 2 (two) times daily before a meal. For diabetes management 01/23/15  Yes Sanjuana Kava, NP  hydrOXYzine (ATARAX/VISTARIL) 25 MG tablet Take 1 tablet (25 mg total) by mouth every 6 (six) hours as needed for anxiety. 08/10/15  Yes Beau Fanny, FNP  insulin aspart (NOVOLOG FLEXPEN) 100 UNIT/ML FlexPen Inject 3-10 Units into the skin 3 (three) times daily with meals. Per sliding scale: For diabetes management 01/23/15  Yes Sanjuana Kava, NP  insulin detemir (LEVEMIR) 100 UNIT/ML injection Inject 0.4 mLs (40 Units total) into the skin at bedtime. For diabetes management 01/23/15  Yes Sanjuana Kava, NP  metFORMIN (GLUCOPHAGE) 1000 MG tablet Take 1 tablet (1,000 mg total) by mouth 2 (two) times daily with a meal. For diabetes management 08/10/15  Yes Beau Fanny, FNP  sertraline (ZOLOFT) 100 MG tablet Take 1 tablet (100 mg total) by mouth daily. 08/10/15  Yes Beau Fanny, FNP  simvastatin (ZOCOR) 40 MG tablet Take 1 tablet (40 mg total) by mouth daily at 6 PM. 08/10/15  Yes Beau Fanny, FNP  zolpidem (AMBIEN) 5 MG tablet Take 1 tablet (5 mg total) by mouth at bedtime as needed for sleep. 08/10/15  Yes Beau Fanny, FNP  BP 131/84 mmHg  Pulse 60  Temp(Src) 98.1 F (36.7 C) (Oral)  Resp 19  Ht 5\' 9"  (1.753 m)  Wt 100.88 kg  BMI 32.83 kg/m2  SpO2 100%   Physical Exam  Constitutional: He is oriented to person, place, and time. He appears well-developed and well-nourished. No distress.  HENT:  Head: Normocephalic and atraumatic.  Eyes: Conjunctivae and EOM are normal. No scleral icterus.  Neck: Normal range of motion.  Pulmonary/Chest: Effort normal. No respiratory distress. He has no wheezes.  Musculoskeletal:  Normal range of motion.  Neurological: He is alert and oriented to person, place, and time. He exhibits normal muscle tone. Coordination normal.  Skin: Skin is warm and dry. No rash noted. He is not diaphoretic. No erythema. No pallor.  Psychiatric: He has a normal mood and affect. His speech is normal and behavior is normal. He expresses homicidal and suicidal ideation. He expresses suicidal plans.  Patient calm; cooperative  Nursing note and vitals reviewed.   ED Course  Procedures (including critical care time) Labs Review Labs Reviewed  COMPREHENSIVE METABOLIC PANEL - Abnormal; Notable for the following:    Sodium 133 (*)    Chloride 99 (*)    Glucose, Bld 393 (*)    Albumin 3.4 (*)    All other components within normal limits  ACETAMINOPHEN LEVEL - Abnormal; Notable for the following:    Acetaminophen (Tylenol), Serum <10 (*)    All other components within normal limits  CBC - Abnormal; Notable for the following:    Hemoglobin 12.7 (*)    HCT 38.5 (*)    All other components within normal limits  URINE RAPID DRUG SCREEN, HOSP PERFORMED - Abnormal; Notable for the following:    Cocaine POSITIVE (*)    Tetrahydrocannabinol POSITIVE (*)    All other components within normal limits  ETHANOL  SALICYLATE LEVEL    Imaging Review No results found.   I have personally reviewed and evaluated these images and lab results as part of my medical decision-making.   EKG Interpretation None      MDM   Final diagnoses:  Paranoid schizophrenia (HCC)    Patient medically cleared. He is pending TTS recommendations. Diabetic medications ordered.   Filed Vitals:   02/04/16 2050  BP: 131/84  Pulse: 60  Temp: 98.1 F (36.7 C)  TempSrc: Oral  Resp: 19  Height: 5\' 9"  (1.753 m)  Weight: 100.88 kg  SpO2: 100%     Antony MaduraKelly Yaziel Brandon, PA-C 02/05/16 0518  Layla MawKristen N Ward, DO 02/05/16 [id number]

## 2016-02-05 NOTE — BH Assessment (Signed)
Per Donell SievertSpencer Simon, PA - patient meets criteria for inpatient hospitalization.  [facility](Tori) [facility].  TTS will seek placement.   Writer informed the PA Harvin Hazel(Kelli).

## 2016-02-05 NOTE — ED Notes (Signed)
Breakfast tray ordered 

## 2016-02-05 NOTE — ED Notes (Signed)
Dinner order request received.

## 2016-02-05 NOTE — BH Assessment (Addendum)
Tele Assessment Note   Patient is a 55 year old African American male that reports hearing voices telling him to shot himself and others in the head.  Patient reports, "seeing people that are trying to get him".  Patient reports that he has to protect himself so that the people do not get him and murder him as well.  Patient reports that he is not able to contract for safety.   Patient reports depression and grief regarding the murder of his step son five days ago.  Patient reports that the police do not know who killed his step son.  Patient reports that he no longer has access to his firearm because his cousin took it away from him.   Patient reports that he has been divorced from his step son's mother for four years but he still remained close to his step son.   Patient reports that he has been experiencing these suicidal and homicidal thoughts for the past five days.  Patient reports prior inpatient psychiatric hospitalization in January 2017.  Patient reports multiple inpatient psychiatric hospitalizations in 2015,2014,2013,2012 and [address].     Patient reports a prior diagnosis of Schizophrenia, Bipolar affective Disorder.  Patient states that he has been compliant with his psychiatric medications that he receives from [facility].  Patient reports that he does not think that the medication is working.  Patient reports that he currently lives with his mother.   Patient reports that he does not work because he receives disability.   Patient reports that he used cocaine 5 days ago; however, his UDS is positive for cocaine.  Patient reports that he uses cocaine infrequently.  Patient denies a history of seizures.  Patient denies withdrawal symptoms.  Patient reports that he was in a substance abuse facility in [place] 8 months ago for detox and treatment of dual diagnosis.   Diagnosis: Paranoid Schizophrenia  Past Medical History:  Past Medical History  Diagnosis Date  . Diabetes mellitus   .  Bipolar affective disorder (HCC)   . Schizophrenia, schizo-affective (HCC)   . Hypertension   . Diabetes mellitus (HCC) 03/11/2012  . Bipolar disorder (HCC) 03/11/2012  . Anxiety   . Depression   . 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 Finger surgery    . Wisdom tooth extraction      Family History:  Family History  Problem Relation Age of Onset  . Diabetes Mother   . Bipolar disorder Sister     Social History:  reports that he has never smoked. He has never used smokeless tobacco. He reports that he uses illicit drugs (Marijuana and Cocaine). He reports that he does not drink alcohol.  Additional Social History:  Alcohol / Drug Use History of alcohol / drug use?: Yes Longest period of sobriety (when/how long): Six years Negative Consequences of Use: Financial, Personal relationships, Work / Programmer, multimedia Withdrawal Symptoms:  (None Reported) Substance #1 Name of Substance 1: Cocaine 1 - Age of First Use: 55 years old 1 - Amount (size/oz): varies 1 - Frequency: Patient reports that he uses infrequently 1 - Duration: On and off for the past couple of years, per patients  1 - Last Use / Amount: 5 days ago per patient, however his UDS is positive for cocaine.   CIWA: CIWA-Ar BP: 131/84 mmHg Pulse Rate: 60 COWS:    PATIENT STRENGTHS: (choose at least two) Average or above average intelligence Communication skills Physical Health Supportive  family/friends  Allergies:  Allergies  Allergen Reactions  . Wellbutrin [Bupropion] Swelling and Hives    Other reaction(s): Unknown    Home Medications:  (Not in a hospital admission)  OB/GYN Status:  No LMP for male patient.  General Assessment Data [facility] Assessment: In system Is this a Tele or Face-to-Face Assessment?: Tele Assessment Is this an Initial Assessment or a Re-assessment for this encounter?: Initial Assessment Marital status:  Divorced SeligmanMaiden name: NA Is patient pregnant?: No Pregnancy Status: No Living Arrangements: Parent (Lives with his mother and step father ) Can pt return to current living arrangement?: Yes Admission Status: Voluntary Is patient capable of signing voluntary admission?: Yes Referral Source: Self/Family/Friend Insurance type: Medicare     Crisis Care Plan Living Arrangements: Parent (Lives with his mother and step father ) Legal Guardian:  (NA) Name of Psychiatrist: [facility] - Dr. Manson PasseyBrown  Name of Therapist: [facility]  Education Status Is patient currently in school?: No Current Grade: NA Highest grade of school patient has completed: High School graduate  Name of school: NA Contact person: NA  Risk to self with the past 6 months Suicidal Ideation: Yes-Currently Present Has patient been a risk to self within the past 6 months prior to admission? : Yes Suicidal Intent: Yes-Currently Present Has patient had any suicidal intent within the past 6 months prior to admission? : Yes Is patient at risk for suicide?: Yes Suicidal Plan?: Yes-Currently Present Has patient had any suicidal plan within the past 6 months prior to admission? : Yes Specify Current Suicidal Plan: Shot himself Access to Means: No What has been your use of drugs/alcohol within the last 12 months?: Cocaine Previous Attempts/Gestures: Yes How many times?: 5 Other Self Harm Risks: None Reported Triggers for Past Attempts: Unpredictable Intentional Self Injurious Behavior: None Family Suicide History: No Recent stressful life event(s): Loss (Comment) (Step son was murdered.) Persecutory voices/beliefs?: Yes Depression: Yes Depression Symptoms: Despondent, Insomnia, Isolating, Fatigue, Guilt, Loss of interest in usual pleasures, Feeling worthless/self pity, Feeling angry/irritable Substance abuse history and/or treatment for substance abuse?: Yes Suicide prevention information given to non-admitted patients:  Yes  Risk to Others within the past 6 months Homicidal Ideation: Yes-Currently Present Does patient have any lifetime risk of violence toward others beyond the six months prior to admission? : Yes (comment) Thoughts of Harm to Others: Yes-Currently Present Comment - Thoughts of Harm to Others: Wants to harm the person that killed his step son Current Homicidal Intent: No Current Homicidal Plan: No Access to Homicidal Means: No Identified Victim: None Reported History of harm to others?: No Assessment of Violence: None Noted Violent Behavior Description: None Reported Does patient have access to weapons?: No (Patient reports that his cousin took his gun.) Criminal Charges Pending?: No Does patient have a court date: No Is patient on probation?: No  Psychosis Hallucinations: Auditory, Visual Delusions: None noted  Mental Status Report Appearance/Hygiene: Disheveled Eye Contact: Fair Motor Activity: Freedom of movement, Restlessness Speech: Logical/coherent Level of Consciousness: Alert Mood: Depressed, Suspicious, Despair, Helpless, Worthless, low self-esteem Affect: Anxious, Blunted, Depressed, Sad Anxiety Level: Minimal Thought Processes: Coherent, Relevant Judgement: Impaired Orientation: Person, Place, Time, Situation Obsessive Compulsive Thoughts/Behaviors: None  Cognitive Functioning Concentration: Decreased Memory: Recent Intact, Remote Intact IQ: Average Insight: Poor Impulse Control: Poor Appetite: Poor Weight Loss: 0 Weight Gain: 0 Sleep: Decreased Total Hours of Sleep: 4 Vegetative Symptoms: Decreased grooming, Staying in bed  ADLScreening [facility] Assessment Services) Patient's cognitive ability adequate to safely complete  daily activities?: Yes Patient able to express need for assistance with ADLs?: Yes Independently performs ADLs?: Yes (appropriate for developmental age)  Prior Inpatient Therapy Prior Inpatient Therapy: Yes Prior Therapy Dates: January  2017 (2015,2014,2013,2012,2011) Prior Therapy Facilty/Provider(s): [facility] Reason for Treatment: SI and Psychosis  Prior Outpatient Therapy Prior Outpatient Therapy: Yes Prior Therapy Dates: Ongoing  Prior Therapy Facilty/Provider(s): [facility] Reason for Treatment: Medication Management and Outpatient Therapy Does patient have an ACCT team?: No Does patient have Intensive In-House Services?  : No Does patient have [facility] services? : No Does patient have P4CC services?: No  ADL Screening (condition at time of admission) Patient's cognitive ability adequate to safely complete daily activities?: Yes Is the patient deaf or have difficulty hearing?: No Does the patient have difficulty seeing, even when wearing glasses/contacts?: No Does the patient have difficulty concentrating, remembering, or making decisions?: Yes Patient able to express need for assistance with ADLs?: Yes Does the patient have difficulty dressing or bathing?: No Independently performs ADLs?: Yes (appropriate for developmental age) Does the patient have difficulty walking or climbing stairs?: No Weakness of Legs: None Weakness of Arms/Hands: None  Home Assistive Devices/Equipment Home Assistive Devices/Equipment: None    Abuse/Neglect Assessment (Assessment to be complete while patient is alone) Physical Abuse: Denies Verbal Abuse: Denies Sexual Abuse: Denies Exploitation of patient/patient's resources: Denies Self-Neglect: Denies Values / Beliefs Cultural Requests During Hospitalization: None Spiritual Requests During Hospitalization: None Consults Spiritual Care Consult Needed: No Social Work Consult Needed: No Merchant navy officer (For Healthcare) Does patient have an advance directive?: No Would patient like information on creating an advanced directive?: No - patient declined information    Additional Information 1:1 In Past 12 Months?: No CIRT Risk: No Elopement Risk: No Does patient have medical  clearance?: Yes     Disposition: Per Donell Sievert, PA - patient meets criteria for inpatient hospitalization.  Per [facility] Delorise Jackson) no appropriate beds at [facility].  TTS will seek placement.  Disposition Initial Assessment Completed for this Encounter: Yes  Phillip Heal LaVerne 02/05/2016 3:45 AM

## 2016-02-05 NOTE — Progress Notes (Signed)
Seeking inpatient psychiatric treatment for pt at recommendation of TTS.  [facility]- per Y[facility]- [facility]- on waiting list per Krista Blueim   Bellina Tokarczyk, MSW, LCSW Clinical Social Work, Disposition  02/05/2016 [phone]

## 2016-02-05 NOTE — ED Notes (Signed)
Patient was given a snack and drink. A regular diet was ordered for lunch. 

## 2016-02-05 NOTE — ED Notes (Signed)
Ordered diet tray 

## 2016-02-05 NOTE — ED Notes (Signed)
Pt given sherbet

## 2016-02-05 NOTE — ED Notes (Signed)
Pt lying on bed watching tv. Pt aware and is in agreement w/tx plan - [facility].

## 2016-02-05 NOTE — ED Notes (Signed)
Patient CBG was 250. 

## 2016-02-06 ENCOUNTER — Encounter: Payer: Self-pay | Admitting: Psychiatry

## 2016-02-06 DIAGNOSIS — F250 Schizoaffective disorder, bipolar type: Principal | ICD-10-CM

## 2016-02-06 DIAGNOSIS — F1220 Cannabis dependence, uncomplicated: Secondary | ICD-10-CM | POA: Diagnosis present

## 2016-02-06 DIAGNOSIS — R45851 Suicidal ideations: Secondary | ICD-10-CM

## 2016-02-06 LAB — GLUCOSE, CAPILLARY
GLUCOSE-CAPILLARY: 258 mg/dL — AB (ref 65–99)
GLUCOSE-CAPILLARY: 189 mg/dL — AB (ref 65–99)
Glucose-Capillary: 296 mg/dL — ABNORMAL HIGH (ref 65–99)
Glucose-Capillary: 255 mg/dL — ABNORMAL HIGH (ref 65–99)

## 2016-02-06 MED ORDER — INSULIN ASPART 100 UNIT/ML ~~LOC~~ SOLN
5.0000 [IU] | Freq: Three times a day (TID) | SUBCUTANEOUS | Status: DC
  Administered 2016-02-06 – 2016-02-07 (×3): 5 [IU] via SUBCUTANEOUS
  Filled 2016-02-06: qty 5

## 2016-02-06 MED ORDER — INSULIN DETEMIR 100 UNIT/ML ~~LOC~~ SOLN
45.0000 [IU] | Freq: Every day | SUBCUTANEOUS | Status: DC
Start: 2016-02-06 — End: 2016-02-07
  Administered 2016-02-06: 45 [IU] via SUBCUTANEOUS
  Filled 2016-02-06 (×2): qty 0.45

## 2016-02-06 NOTE — [facility] Counselor (Signed)
Adult Comprehensive Assessment  Patient ID: [name], male DOB: [birth year], 55 y.o. MRN: [id number]  Information Source: Information source: Patient  Current Stressors:  Employment / Job issues: None - patient is disabled Surveyor, quantity / Lack of resources (include bankruptcy): Fixed income Housing / Lack of housing: Pt is living at [employer] Physical health (include injuries & life threatening diseases): Diabetes, Hep C Social relationships: None Substance abuse: Abuses crack cocaine and THC Bereavement: Step son died.   Living/Environment/Situation:  Living Arrangements: Parent Living conditions (as described by patient or guardian): Pt lives with his mother in [place]. He went to [facility] to stay with a friend prior to admission.  How long has patient lived in current situation?: "Ive always lived with my mom"  What is atmosphere in current home: Supportive  Family History:  Marital status: Divorced Divorced, when?: "Couple of years" What types of issues is patient dealing with in the relationship?: None Does patient have children?: Yes How many children?: 1 How is patient's relationship with their children?: Good relationship with adult daughter  Childhood History:  By whom was/is the patient raised?: Mother Additional childhood history information: Good childhood Description of patient's relationship with caregiver when they were a child: Good relationship Patient's description of current relationship with people who raised him/her: Very good relationship Does patient have siblings?: Yes Number of Siblings: 1 Description of patient's current relationship with siblings: Good relationship with sister Did patient suffer from severe childhood neglect?: No Has patient ever been sexually abused/assaulted/raped as an adolescent or adult?: No Was the patient ever a victim of a crime or a disaster?: No Witnessed domestic violence?: No Has patient been effected by  domestic violence as an adult?: No  Education:  Currently a Consulting civil engineer?: No Learning disability?: No  Employment/Work Situation:  Employment situation: On disability Why is patient on disability: Mental Health How long has patient been on disability: One year Patient's job has been impacted by current illness: No What is the longest time patient has a held a job?: 13 years 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 Has patient ever been in the [employer]?: No Has patient ever served in Buyer, retail?: No  Financial Resources:  Surveyor, quantity resources: Administrator, Civil Service SSDI;Medicaid;Medicare Does patient have a representative payee or guardian?: No  Alcohol/Substance Abuse: Pt has been using THC and crack cocaine If attempted suicide, did drugs/alcohol play a role in this?: No Alcohol/Substance Abuse Treatment Hx: Past Tx, Inpatient If yes, describe treatment: [facility] [place], [facility] Has alcohol/substance abuse ever caused legal problems?: No  Social Support System:  Conservation officer, nature Support System: Fair Museum/gallery exhibitions officer System: Mother Type of faith/religion: None How does patient's faith help to cope with current illness?: N/A  Leisure/Recreation:  Leisure and Hobbies: Watch TV  Strengths/Needs:  What things does the patient do well?: Kind to others In what areas does patient struggle / problems for patient: Drug addiction and depression  Discharge Plan:  Does patient have access to transportation?: Yes Will patient be returning to same living situation after discharge?: No, pt would like a residential substance abuse program.  Currently receiving community mental health services: No If no, would patient like referral for services when discharged?: Yes (What county?) [place] Does patient have financial barriers related to discharge medications?: No  Summary/Recommendations:  Patient is a 55 year old male admitted  with a  diagnosis of Major Depression. Patient presented to the hospital with depression, SI and substance abuse. Patient reports primary triggers for  admission were grief. Patient will benefit from crisis stabilization, medication evaluation, group therapy and psycho education in addition to case management for discharge. At discharge, it is recommended that patient remain compliant with established discharge plan and continued treatment.   Daisy FloroCandace L Nona Gracey MSW, LCSWA  02/06/2016 3:22 PM

## 2016-02-06 NOTE — Progress Notes (Signed)
Patient is 55 year old BM reports hearing voices to shot himself and the person who murdered his 55 y/o step son. Pt present with sad and depressed affect. Poor eye contact. Irritable at times but cooperative. Dx bipolar, depression,  HTN, DM and Hep C. Reports smoking marijuana and cocaine last Friday. endorse SI/HI with plan to shot himself and others. Currently, denies to hurt himself during this hospitalization.  UDS positive for MJ and cocaine. Pt requesting residential treatment upon discharge. Blood sugar 213. No s/s of hypo/hyperglycemia noted. Pt searched for contraband none found. Skin checked with another nurse. Skin tags to both upper arms and neck. Black bruise to right knee and rash across upper back and shoulders. Skin warm, dry and intact.  Will continue to monitor for safety and behavior. No c/o pain/discomfort noted.

## 2016-02-06 NOTE — Progress Notes (Signed)
D: Patient stated slept poor last night .Stated appetite fair and energy level  Is normal. Stated concentration ipoor . Stated on Depression scale 10 , hopeless 10 and anxiety 10 .( low 0-10 high) Denies suicidal  homicidal ideations  .  No auditory hallucinations  No pain concerns . Appropriate ADL'S. Interacting with peers and staff.  A: Encourage patient participation with unit programming . Instruction  Given on  Medication , verbalize understanding. R: Voice no other concerns. Staff continue to monitor.

## 2016-02-06 NOTE — H&P (Signed)
Psychiatric Admission Assessment Adult  Patient Identification: Joseph LefortReginald Vasquez MRN:  [id number] Date of Evaluation:  02/06/2016 Chief Complaint:  Major Depression Principal Diagnosis: <principal problem not specified> Diagnosis:   Patient Active Problem List   Diagnosis Date Noted  . Suicidal ideation [R45.851] 02/06/2016  . Cannabis use disorder, moderate, dependence (HCC) [F12.20] 02/06/2016  . Depression [F32.9] 02/05/2016  . Cocaine use disorder, moderate, dependence (HCC) [F14.20] 08/03/2015  . Hyperlipidemia [E78.5] 01/18/2015  . Hyperprolactinemia (HCC) [E22.1] 01/18/2015  . Diabetes mellitus (HCC) [E11.9] 03/11/2012  . HTN (hypertension) [I10] 03/11/2012   History of Present Illness:   Identifying data. [name] is a 55 year old male with history of schizoaffective disorder and substance use.  Chief complaint. "I'm suicidal."  History of present illness. Information was obtained from the patient and the chart. The patient has a long history of depression, mood instability and substance use with multiple psychiatric admissions and medication trials. For the past several months he has been maintained on a combination of Zoloft and Abilify with adequate response. She reports good compliance with medications and [facility]. 2 weeks ago his stepson was killed. The killer was never found and the patient became increasingly depressed with poor sleep, decreased appetite, anhedonia, feeling of guilt of energy and concentration, crying spells and social isolation. She felt that medications would no longer working. For the past 5 days he experienced auditory hallucinations commanding him to kill himself and paranoia. The family is removed his gun from the house and the patient came to the emergency room asking for help. He reports panic attacks, social anxiety and PTSD type of symptoms. There is no OCD. He denies alcohol use but admits to smoking crack and marijuana.  Past  psychiatric history. There were multiple psychiatric hospitalization he claims for both depression and mania. He's been tried on numerous medications. He remembers lithium, Depakote, Tegretol, Seroquel and Risperdal. He attempted suicide twice once by cutting once by overdose. He attempted substance abuse treatment several times. [place] program for 4 months until January 2017.  Family psychiatric history. Sister with schizophrenia.  Social history. He is disabled from mental illness. He lives with his mother.  Total Time spent with patient: 1 hour  Is the patient at risk to self? Yes.    Has the patient been a risk to self in the past 6 months? No.  Has the patient been a risk to self within the distant past? Yes.    Is the patient a risk to others? No.  Has the patient been a risk to others in the past 6 months? No.  Has the patient been a risk to others within the distant past? No.   Prior Inpatient Therapy:   Prior Outpatient Therapy:    Alcohol Screening: Patient refused Alcohol Screening Tool: Yes 1. How often do you have a drink containing alcohol?: Never 9. Have you or someone else been injured as a result of your drinking?: No 10. Has a relative or friend or a doctor or another health worker been concerned about your drinking or suggested you cut down?: No Alcohol Use Disorder Identification Test Final Score (AUDIT): 0 Brief Intervention: AUDIT score less than 7 or less-screening does not suggest unhealthy drinking-brief intervention not indicated Substance Abuse History in the last 12 months:  Yes.   Consequences of Substance Abuse: Negative Previous Psychotropic Medications: Yes  Psychological Evaluations: No  Past Medical History:  Past Medical History  Diagnosis Date  . Diabetes mellitus   . Bipolar affective disorder (HCC)   . Schizophrenia, schizo-affective (HCC)   . Hypertension   . Diabetes mellitus (HCC)  03/11/2012  . Bipolar disorder (HCC) 03/11/2012  . Anxiety   . Depression   . 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 Finger surgery    . Wisdom tooth extraction     Family History:  Family History  Problem Relation Age of Onset  . Diabetes Mother   . Bipolar disorder Sister    Tobacco Screening: @FLOW [phone])::1)@ Social History:  History  Alcohol Use No     History  Drug Use  . Yes  . Special: Marijuana, Cocaine    Comment: 4 days ago last THC and cocoaine use... relapsed after 2 yrs     Additional Social History:                           Allergies:   Allergies  Allergen Reactions  . Wellbutrin [Bupropion] Swelling and Hives    Other reaction(s): Unknown   Lab Results:  Results for orders placed or performed during the hospital encounter of 02/05/16 (from the past 48 hour(s))  Glucose, capillary     Status: Abnormal   Collection Time: 02/05/16  9:14 PM  Result Value Ref Range   Glucose-Capillary 213 (H) 65 - 99 mg/dL  Glucose, capillary     Status: Abnormal   Collection Time: 02/06/16  8:39 AM  Result Value Ref Range   Glucose-Capillary 296 (H) 65 - 99 mg/dL   Comment 1 Document in Chart     Blood Alcohol level:  Lab Results  Component Value Date   ETH <5 02/04/2016   ETH <5 08/01/2015    Metabolic Disorder Labs:  Lab Results  Component Value Date   HGBA1C 11.2* 08/03/2015   MPG 275 08/03/2015   MPG 295 01/17/2015   Lab Results  Component Value Date   PROLACTIN 26.5* 01/17/2015   Lab Results  Component Value Date   CHOL 261* 08/03/2015   TRIG 162* 08/03/2015   HDL 46 08/03/2015   CHOLHDL 5.7 08/03/2015   VLDL 32 08/03/2015   LDLCALC 183* 08/03/2015   LDLCALC 170* 01/17/2015    Current Medications: Current Facility-Administered Medications  Medication Dose Route Frequency Provider Last Rate Last Dose  . acetaminophen (TYLENOL) tablet 650 mg  650 mg Oral Q6H PRN Jimmy Footman, MD      . alum & mag hydroxide-simeth (MAALOX/MYLANTA) 200-200-20 MG/5ML suspension 30 mL  30 mL Oral Q4H PRN Jimmy Footman, MD      . ARIPiprazole (ABILIFY) tablet 5 mg  5 mg Oral Daily Jimmy Footman, MD      . glipiZIDE (GLUCOTROL) tablet 10 mg  10 mg Oral BID AC Jimmy Footman, MD      . insulin aspart (novoLOG) injection 0-5 Units  0-5 Units Subcutaneous QHS Jimmy Footman, MD   2 Units at 02/05/16 2143  . insulin aspart (novoLOG) injection 0-9 Units  0-9 Units Subcutaneous TID WC Jimmy Footman, MD      . insulin detemir (LEVEMIR) injection 40 Units  40 Units Subcutaneous QHS Jimmy Footman, MD   40 Units at 02/05/16 2141  . magnesium hydroxide (MILK OF MAGNESIA) suspension 30 mL  30 mL Oral Daily PRN Jimmy Footman, MD      . metFORMIN (GLUCOPHAGE) tablet 1,000 mg  1,000  mg Oral BID WC Jimmy Footman, MD      . simvastatin (ZOCOR) tablet 40 mg  40 mg Oral q1800 Jimmy Footman, MD      . traZODone (DESYREL) tablet 50 mg  50 mg Oral QHS PRN Jimmy Footman, MD       PTA Medications: Prescriptions prior to admission  Medication Sig Dispense Refill Last Dose  . ARIPiprazole (ABILIFY) 5 MG tablet Take 1 tablet (5 mg total) by mouth daily. 30 tablet 0 02/05/2016 at Unknown time  . glipiZIDE (GLUCOTROL) 10 MG tablet Take 1 tablet (10 mg total) by mouth 2 (two) times daily before a meal. For diabetes management   02/05/2016 at Unknown time  . insulin aspart (NOVOLOG FLEXPEN) 100 UNIT/ML FlexPen Inject 3-10 Units into the skin 3 (three) times daily with meals. Per sliding scale: For diabetes management 15 mL 11 02/05/2016 at Unknown time  . insulin detemir (LEVEMIR) 100 UNIT/ML injection Inject 0.4 mLs (40 Units total) into the skin at bedtime. For diabetes management 10 mL 11 02/05/2016 at Unknown time  . metFORMIN (GLUCOPHAGE) 1000 MG tablet Take 1 tablet (1,000 mg total)  by mouth 2 (two) times daily with a meal. For diabetes management 60 tablet 0 02/05/2016 at Unknown time  . sertraline (ZOLOFT) 100 MG tablet Take 1 tablet (100 mg total) by mouth daily. 30 tablet 0 02/05/2016 at Unknown time  . simvastatin (ZOCOR) 40 MG tablet Take 1 tablet (40 mg total) by mouth daily at 6 PM. 30 tablet 0 02/05/2016 at Unknown time  . zolpidem (AMBIEN) 5 MG tablet Take 1 tablet (5 mg total) by mouth at bedtime as needed for sleep. 15 tablet 0 Past Month at Unknown time  . hydrOXYzine (ATARAX/VISTARIL) 25 MG tablet Take 1 tablet (25 mg total) by mouth every 6 (six) hours as needed for anxiety. (Patient not taking: Reported on 02/05/2016) 30 tablet 0 Not Taking    Musculoskeletal: Strength & Muscle Tone: within normal limits Gait & Station: normal Patient leans: N/A  Psychiatric Specialty Exam: I reviewed physical exam performed in the emergency room and interview with the findings. Physical Exam  Nursing note and vitals reviewed.   Review of Systems  Psychiatric/Behavioral: Positive for depression, suicidal ideas and substance abuse.  All other systems reviewed and are negative.   Blood pressure 119/67, pulse 55, temperature 98.7 F (37.1 C), temperature source Oral, resp. rate 20, SpO2 96 %.There is no weight on file to calculate BMI.  See SRA.                                                  Sleep:  Number of Hours: 7       Treatment Plan Summary: Daily contact with patient to assess and evaluate symptoms and progress in treatment and Medication management   Mr. Stanislaw is a 55 year old male with history of depression, mood instability and substance admitted for suicidal and homicidal threats in the context of recent loss, substance abuse, and treatment noncompliance.  1. Suicidal ideation. The patient is able to contract for safety.  2. Mood. He has been maintained on a combination of Abilify and Zoloft for depression and mood stabilization.  We'll restart both.  3. Diabetes. He is on glipizide, metformin, and insulin. We'll order ADA diet and blood glucose monitoring and sliding insulin.  4. Dyslipidemia. He is  on Zocor.  5. Insomnia. He is on trazodone.  6. Substance abuse treatment. He is positive for cocaine and cannabinoids. He minimizes his problems in the past treatment.  7. Metabolic syndrome screening. Lipid panel, TSH, hemoglobin A1c and prolactin are pending.   8. Disposition. To be established.   Observation Level/Precautions:  15 minute checks  Laboratory:  CBC Chemistry Profile UDS UA  Psychotherapy:    Medications:    Consultations:    Discharge Concerns:    Estimated LOS:  Other:     I certify that inpatient services furnished can reasonably be expected to improve the patient's condition.    Kristine Linea, MD 7/12/20178:40 AM
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
## 2016-02-06 NOTE — Progress Notes (Signed)
Recreation Therapy Notes  Date: 07.12.17 Time: 1:00 pm Location: Craft Room  Group Topic: Self-esteem  Goal Area(s) Addresses:  Patient will write at least one positive trait about self. Patient will verbalize benefit of having healthy self-esteem.  Behavioral Response: Did not attend  Intervention: I Am  Activity: Patients were given a worksheet with the letter I on it and instructed to write as many positive traits about themselves inside the letter.  Education: LRT educated patients on ways they can increase their self-esteem.  Education Outcome: Patient did not attend group.  Clinical Observations/Feedback: Patient did not attend group.  Jacquelynn CreeGreene,Tyja Gortney M, LRT/CTRS 02/06/2016 3:35 PM

## 2016-02-06 NOTE — Progress Notes (Addendum)
Inpatient Diabetes Program Recommendations  AACE/ADA: New Consensus Statement on Inpatient Glycemic Control (2015)  Target Ranges:  Prepandial:   less than 140 mg/dL      Peak postprandial:   less than 180 mg/dL (1-2 hours)      Critically ill patients:  140 - 180 mg/dL   Results for [name], Tavis (MRN [id number]) as of 02/06/2016 10:21  Ref. Range 02/05/2016 08:17 02/05/2016 11:28 02/05/2016 17:21 02/05/2016 21:14 02/06/2016 08:39  Glucose-Capillary Latest Ref Range: 65-99 mg/dL 409314 (H) 811250 (H) 914262 (H) 213 (H) 296 (H)   Review of Glycemic Control  Diabetes history: DM2 Outpatient Diabetes medications: Levemir 40 units QHS, Novolog 3-6 units TID with meals, Metformin 1000 mg BID, Glipizide 10 mg BID Current orders for Inpatient glycemic control: Levemir 40 units QHS, Novolog 0-9 units TID with meals, Novolog 0-5 units QHS, Metformin 1000 mg BID, Glipizide 10 mg BID  Inpatient Diabetes Program Recommendations:  Insulin - Basal: Please consider increasing Levemir to 45 units QHS. Insulin-Meal Coverage: Please consider ordering Novolog 5 units TID with meals for meal coverage if patient eats at least 50% of meals (in addition to Novolog correction scale).  NOTE: Talked with Dedra SkeensGwen, RN at 10:26 to make her aware of recommendations. Gwen, RN notes that glucose of 296 mg/dl this morning was taken after patient started eating. Glucose ranged from 250-314 mg/dl on 7/82/957/11/17 and patient appears to need more insulin. Still recommend increasing Levemir and ordering meal coverage as noted above. Asked Gwen, RN to discuss with MD. Also made MD sticky note on chart.  Thanks, Orlando PennerMarie Haelee Bolen, RN, MSN, CDE Diabetes Coordinator Inpatient Diabetes Program [phone] (Team Pager from 8am to 5pm) [phone] ([facility] office) [phone] [facility] office) [phone] [facility] office)

## 2016-02-06 NOTE — [facility] Group Notes (Signed)
[facility] Group Notes:  (Nursing/MHT/Case Management/Adjunct)  Date:  02/06/2016  Time:  5:53 PM  Type of Therapy:  Psychoeducational Skills  Participation Level:  Active  Participation Quality:  Appropriate, Attentive and Sharing  Affect:  Appropriate  Cognitive:  Alert and Appropriate  Insight:  Appropriate  Engagement in Group:  Engaged  Modes of Intervention:  Discussion, Education and Support  Summary of Progress/Problems:  [name] [facility] Vasquez 02/06/2016, 5:53 PM

## 2016-02-06 NOTE — Plan of Care (Signed)
Problem: Coping: Goal: Ability to cope will improve Outcome: Not Progressing Receiving listing of coping  Skills , encourage to work on them .

## 2016-02-06 NOTE — Progress Notes (Signed)
Recreation Therapy Notes  INPATIENT RECREATION THERAPY ASSESSMENT  Patient Details Name: Andre LefortReginald Pandit MRN: [id number] DOB: 05/17/1961 Today's Date: 02/06/2016  Patient Stressors: Relationship, Death (Recent break-up 2 weeks ago; step don died 4 weeks ago)  Coping Skills:   Isolate, Arguments, Substance Abuse, Avoidance, Exercise, Art/Dance, Talking, Music, Sports  Personal Challenges: Anger, Communication, Concentration, Decision-Making, Expressing Yourself, Problem-Solving, Relationships, Self-Esteem/Confidence, Social Interaction, Stress Management, Substance Abuse, Time Management, Trusting Others  Leisure Interests (2+):  Individual - Other (Comment) (Watch sports, go to sporting events)  Awareness of Community Resources:  Yes  Community Resources:  [employer], [facility]  Current Use: Yes  If no, Barriers?:    Patient Strengths:  "No"  Patient Identified Areas of Improvement:  Depression  Current Recreation Participation:  Nothing  Patient Goal for Hospitalization:  To get into a substance abuse program  [place]  [place]   Current [place] (including self-harm):  Yes  Current HI:  Yes  Consent to Intern Participation: N/A   Jacquelynn CreeGreene,Dawid Dupriest M, LRT/CTRS 02/06/2016, 4:12 PM

## 2016-02-06 NOTE — Progress Notes (Addendum)
[facility] MD Progress Note  02/06/2016 6:34 PM [name]  MRN:  [id number]  Subjective:  [name] is still depressed and actively suicida. He is able to contract for safety on the unit. He now tells me that abilify has not worked for him and wants new medications. He is homeless , as it turns out, and wants to be placed. Social worker is aware. PPD placed.  Principal Problem: Schizoaffective disorder, bipolar type (HCC) Diagnosis:   Patient Active Problem List   Diagnosis Date Noted  . Suicidal ideation [R45.851] 02/06/2016  . Cannabis use disorder, moderate, dependence (HCC) [F12.20] 02/06/2016  . Schizoaffective disorder, bipolar type (HCC) [F25.0] 02/05/2016  . Cocaine use disorder, moderate, dependence (HCC) [F14.20] 08/03/2015  . Hyperlipidemia [E78.5] 01/18/2015  . Hyperprolactinemia (HCC) [E22.1] 01/18/2015  . Diabetes mellitus (HCC) [E11.9] 03/11/2012  . HTN (hypertension) [I10] 03/11/2012   Total Time spent with patient: 20 minutes  Past Psychiatric History: schizoaffective disorder, substance abuse.  Past Medical History:  Past Medical History  Diagnosis Date  . Diabetes mellitus   . Bipolar affective disorder (HCC)   . Schizophrenia, schizo-affective (HCC)   . Hypertension   . Diabetes mellitus (HCC) 03/11/2012  . Bipolar disorder (HCC) 03/11/2012  . Anxiety   . Depression   . 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 Finger surgery    . Wisdom tooth extraction     Family History:  Family History  Problem Relation Age of Onset  . Diabetes Mother   . Bipolar disorder Sister    Family Psychiatric  History: see H7P Social History:  History  Alcohol Use No     History  Drug Use  . Yes  . Special: Marijuana, Cocaine    Comment: 4 days ago last THC and cocoaine use... relapsed after 2 yrs     Social History   Social History  . Marital Status: Single    Spouse Name: N/A  . Number of Children: N/A  . Years of  Education: N/A   Social History Main Topics  . Smoking status: Never Smoker   . Smokeless tobacco: Never Used  . Alcohol Use: No  . Drug Use: Yes    Special: Marijuana, Cocaine     Comment: 4 days ago last THC and cocoaine use... relapsed after 2 yrs   . Sexual Activity: Yes    Birth Control/ Protection: Condom     Comment: marijuana 25 days ago   Other Topics Concern  . None   Social History Narrative   Additional Social History:                         Sleep: Fair  Appetite:  Fair  Current Medications: Current Facility-Administered Medications  Medication Dose Route Frequency Provider Last Rate Last Dose  . acetaminophen (TYLENOL) tablet 650 mg  650 mg Oral Q6H PRN Jimmy Footman, MD      . alum & mag hydroxide-simeth (MAALOX/MYLANTA) 200-200-20 MG/5ML suspension 30 mL  30 mL Oral Q4H PRN Jimmy Footman, MD      . ARIPiprazole (ABILIFY) tablet 5 mg  5 mg Oral Daily Jimmy Footman, MD   5 mg at 02/06/16 0845  . glipiZIDE (GLUCOTROL) tablet 10 mg  10 mg Oral BID AC Jimmy Footman, MD   10 mg at 02/06/16 1644  . insulin aspart (novoLOG) injection 0-5 Units  0-5 Units Subcutaneous QHS Jimmy Footman, MD  2 Units at 02/05/16 2143  . insulin aspart (novoLOG) injection 0-9 Units  0-9 Units Subcutaneous TID WC Jimmy FootmanAndrea Hernandez-Gonzalez, MD   5 Units at 02/06/16 1644  . insulin aspart (novoLOG) injection 5 Units  5 Units Subcutaneous TID WC Shari ProwsJolanta B Pucilowska, MD   5 Units at 02/06/16 1645  . insulin detemir (LEVEMIR) injection 45 Units  45 Units Subcutaneous QHS Jolanta B Pucilowska, MD      . magnesium hydroxide (MILK OF MAGNESIA) suspension 30 mL  30 mL Oral Daily PRN Jimmy FootmanAndrea Hernandez-Gonzalez, MD      . metFORMIN (GLUCOPHAGE) tablet 1,000 mg  1,000 mg Oral BID WC Jimmy FootmanAndrea Hernandez-Gonzalez, MD   1,000 mg at 02/06/16 1648  . simvastatin (ZOCOR) tablet 40 mg  40 mg Oral q1800 Jimmy FootmanAndrea Hernandez-Gonzalez, MD   40 mg at  02/06/16 1648  . traZODone (DESYREL) tablet 50 mg  50 mg Oral QHS PRN Jimmy FootmanAndrea Hernandez-Gonzalez, MD        Lab Results:  Results for orders placed or performed during the hospital encounter of 02/05/16 (from the past 48 hour(s))  Glucose, capillary     Status: Abnormal   Collection Time: 02/05/16  9:14 PM  Result Value Ref Range   Glucose-Capillary 213 (H) 65 - 99 mg/dL  Glucose, capillary     Status: Abnormal   Collection Time: 02/06/16  8:39 AM  Result Value Ref Range   Glucose-Capillary 296 (H) 65 - 99 mg/dL   Comment 1 Document in Chart   Glucose, capillary     Status: Abnormal   Collection Time: 02/06/16 11:43 AM  Result Value Ref Range   Glucose-Capillary 255 (H) 65 - 99 mg/dL   Comment 1 Notify RN   Glucose, capillary     Status: Abnormal   Collection Time: 02/06/16  4:30 PM  Result Value Ref Range   Glucose-Capillary 258 (H) 65 - 99 mg/dL    Blood Alcohol level:  Lab Results  Component Value Date   ETH <5 02/04/2016   ETH <5 08/01/2015    Metabolic Disorder Labs: Lab Results  Component Value Date   HGBA1C 11.2* 08/03/2015   MPG 275 08/03/2015   MPG 295 01/17/2015   Lab Results  Component Value Date   PROLACTIN 26.5* 01/17/2015   Lab Results  Component Value Date   CHOL 261* 08/03/2015   TRIG 162* 08/03/2015   HDL 46 08/03/2015   CHOLHDL 5.7 08/03/2015   VLDL 32 08/03/2015   LDLCALC 183* 08/03/2015   LDLCALC 170* 01/17/2015    Physical Findings: AIMS: Facial and Oral Movements Muscles of Facial Expression: None, normal Lips and Perioral Area: None, normal Jaw: None, normal Tongue: None, normal,Extremity Movements Upper (arms, wrists, hands, fingers): None, normal Lower (legs, knees, ankles, toes): None, normal, Trunk Movements Neck, shoulders, hips: None, normal, Overall Severity Severity of abnormal movements (highest score from questions above): None, normal Incapacitation due to abnormal movements: None, normal Patient's awareness of  abnormal movements (rate only patient's report): No Awareness, Dental Status Current problems with teeth and/or dentures?: No Does patient usually wear dentures?: No  CIWA:  CIWA-Ar Total: 0 COWS:  COWS Total Score: 0  Musculoskeletal: Strength & Muscle Tone: within normal limits Gait & Station: normal Patient leans: N/A  Psychiatric Specialty Exam: Physical Exam  Nursing note and vitals reviewed.   Review of Systems  Psychiatric/Behavioral: Positive for depression, suicidal ideas, hallucinations and substance abuse. The patient is nervous/anxious.   All other systems reviewed and are negative.   Blood  pressure 119/67, pulse 55, temperature 98.7 F (37.1 C), temperature source Oral, resp. rate 20, SpO2 96 %.There is no weight on file to calculate BMI.  General Appearance: Casual  Eye Contact:  Good  Speech:  Clear and Coherent  Volume:  Normal  Mood:  Depressed, Hopeless and Worthless  Affect:  Appropriate  Thought Process:  Goal Directed  Orientation:  Full (Time, Place, and Person)  Thought Content:  WDL  Suicidal Thoughts:  Yes.  with intent/plan  Homicidal Thoughts:  No  Memory:  Immediate;   Fair Recent;   Fair Remote;   Fair  Judgement:  Impaired  Insight:  Shallow  Psychomotor Activity:  Normal  Concentration:  Concentration: Fair and Attention Span: Fair  Recall:  [employer] of Knowledge:  Fair  Language:  Fair  Akathisia:  No  Handed:  Right  AIMS (if indicated):     Assets:  Communication Skills Desire for Improvement Financial Resources/Insurance Resilience Social Support  ADL's:  Intact  Cognition:  WNL  Sleep:  Number of Hours: 7     Treatment Plan Summary: Daily contact with patient to assess and evaluate symptoms and progress in treatment and Medication management   Mr. Gildersleeve is a 55 year old male with history of depression, psychosis mood instability and substance admitted for suicidal and homicidal threats in the context of recent loss,  substance abuse, and treatment noncompliance.  1. Suicidal ideation. The patient is able to contract for safety in the hospital.  2. Mood. We tarted Effexor for depression and Geodon for mood stabiization and psychosis.  3. Diabetes. He is on glipizide, metformin, and insulin. We'll order ADA diet, blood glucose monitoring and sliding insulin.  4. Dyslipidemia. He is on Zocor.  5. Insomnia. He is on trazodone.  6. Substance abuse treatment. He is positive for cocaine and cannabinoids. He minimizes his problemsand declines treatment.  7. Metabolic syndrome screening. Lipid panel, TSH, hemoglobin A1c and prolactin are pending.   8. Disposition. We will attempt to place. He lives in [place]. PPD ordered.  Kristine Linea, MD 02/06/2016, 6:34 PM

## 2016-02-06 NOTE — [facility] Group Notes (Signed)
[facility] LCSW Group Therapy   02/06/2016  1pm  Type of Therapy: Group Therapy   Participation Level: Did Not Attend. Patient invited to participate but declined.    Saige Busby F. Jonessa Triplett, MSW, LCSWA, LCAS     

## 2016-02-07 LAB — GLUCOSE, CAPILLARY
GLUCOSE-CAPILLARY: 246 mg/dL — AB (ref 65–99)
GLUCOSE-CAPILLARY: 291 mg/dL — AB (ref 65–99)
Glucose-Capillary: 258 mg/dL — ABNORMAL HIGH (ref 65–99)
Glucose-Capillary: 214 mg/dL — ABNORMAL HIGH (ref 65–99)

## 2016-02-07 MED ORDER — VENLAFAXINE HCL ER 75 MG PO CP24
150.0000 mg | ORAL_CAPSULE | Freq: Every day | ORAL | Status: DC
  Administered 2016-02-07 – 2016-02-08 (×2): 150 mg via ORAL
  Filled 2016-02-07: qty 2

## 2016-02-07 MED ORDER — TUBERCULIN PPD 5 UNIT/0.1ML ID SOLN
5.0000 [IU] | Freq: Once | INTRADERMAL | Status: DC
  Administered 2016-02-07: 5 [IU] via INTRADERMAL
  Filled 2016-02-07 (×2): qty 0.1

## 2016-02-07 MED ORDER — INSULIN DETEMIR 100 UNIT/ML ~~LOC~~ SOLN
50.0000 [IU] | Freq: Every day | SUBCUTANEOUS | Status: DC
  Administered 2016-02-07: 50 [IU] via SUBCUTANEOUS
  Filled 2016-02-07 (×3): qty 0.5

## 2016-02-07 MED ORDER — INSULIN ASPART 100 UNIT/ML ~~LOC~~ SOLN
8.0000 [IU] | Freq: Three times a day (TID) | SUBCUTANEOUS | Status: DC
  Administered 2016-02-07 – 2016-02-08 (×3): 8 [IU] via SUBCUTANEOUS
  Filled 2016-02-07 (×2): qty 8

## 2016-02-07 MED ORDER — ZIPRASIDONE HCL 40 MG PO CAPS
40.0000 mg | ORAL_CAPSULE | Freq: Two times a day (BID) | ORAL | Status: DC
  Administered 2016-02-07 – 2016-02-08 (×2): 40 mg via ORAL
  Filled 2016-02-07 (×2): qty 1

## 2016-02-07 NOTE — [facility] Group Notes (Signed)
[facility] LCSW Group Therapy   02/07/2016  9:30am  Type of Therapy: Group Therapy   Participation Level: Did Not Attend. Patient invited to participate but declined.    [place]eta Silveria F. Shenekia Riess, MSW, LCSWA, LCAS     

## 2016-02-07 NOTE — [facility] Group Notes (Signed)
[facility] Group Notes:  (Nursing/MHT/Case Management/Adjunct)  Date:  02/07/2016  Time:  3:55 PM  Type of Therapy:  Psychoeducational Skills  Participation Level:  None  Participation Quality:  Little to no participation and left several times thorughout group for long periods of time.   Affect:  Flat  Cognitive:  Lacking  Insight:  None  Engagement in Group:  Limited and Poor  Modes of Intervention:  Discussion, Education and Support  Summary of Progress/Problems:  Joseph SmokeCara Vasquez [name] 02/07/2016, 3:55 PM

## 2016-02-07 NOTE — Progress Notes (Signed)
Recreation Therapy Notes  Date: 07.13.17 Time: 1:00 pm Location: Craft Room  Group Topic: Leisure Education  Goal Area(s) Addresses:  Patient will identify activities for each letter of the alphabet. Patient will verbalize ability to integrate positive leisure into life post d/c. Patient will verbalize ability to use leisure as a coping skill.  Behavioral Response: Did not attend  Intervention: Leisure Alphabet  Activity: Patients were given a Leisure Alphabet worksheet and instructed to identify a leisure activity for each letter of the alphabet.   Education: LRT educated patients on what they need to participate in leisure.  Education Outcome: Patient did not attend group.  Clinical Observations/Feedback: Patient did not attend group.  Tziporah Knoke M, LRT/CTRS 02/07/2016 3:55 PM 

## 2016-02-07 NOTE — Plan of Care (Signed)
Problem: Activity: Goal: Interest or engagement in leisure activities will improve Outcome: Not Met (add Reason) Pt remains depression and guarded. Passive SI, w/ no plan. Limited interaction with peers. Did not attend group. Med compliant. Blood sugar 189, no s/s hypo/hyperglycemia noted. No behavior issues noted. q 15 min checks maintained for safety.

## 2016-02-07 NOTE — Progress Notes (Signed)
D: Patient stated slept poor  last night .Stated appetite is fair and energy level  Is lowl. Stated concentration is good . Stated on Depression scale 9, hopeless 9 and anxiety 9 .( low 0-10 high) Denies suicidal  homicidal ideations  .  No auditory hallucinations  No pain concerns . Appropriate ADL'S. Interacting with peers and staff.  Patient voice concerns around his not getting his medications .that he  Was on previously  . Stated the medications aren't working . MD notifield A: Encourage patient participation with unit programming . Instruction  Given on  Medication , verbalize understanding. R: Voice no other concerns. Staff continue to monitor

## 2016-02-07 NOTE — Plan of Care (Signed)
Problem: Education: Goal: Ability to make informed decisions regarding treatment will improve Outcome: Progressing Educated on medication  regiment

## 2016-02-07 NOTE — Plan of Care (Signed)
Problem: Education: Goal: Ability to make informed decisions regarding treatment will improve Outcome: Progressing Educated on medication  regiment     

## 2016-02-07 NOTE — Progress Notes (Signed)
Inpatient Diabetes Program Recommendations  AACE/ADA: New Consensus Statement on Inpatient Glycemic Control (2015)  Target Ranges:  Prepandial:   less than 140 mg/dL      Peak postprandial:   less than 180 mg/dL (1-2 hours)      Critically ill patients:  140 - 180 mg/dL   Lab Results  Component Value Date   GLUCAP 246* 02/07/2016   HGBA1C 11.2* 08/03/2015    Review of Glycemic Control  Results for [name], [name] (MRN [id number]) as of 02/07/2016 12:58  Ref. Range 02/06/2016 11:43 02/06/2016 16:30 02/06/2016 20:29 02/07/2016 06:48 02/07/2016 11:08  Glucose-Capillary Latest Ref Range: 65-99 mg/dL 829255 (H) 562258 (H) 130189 (H) 246 (H) 291 (H)    Diabetes history: DM2 Outpatient Diabetes medications: Levemir 40 units QHS, Novolog 3-6 units TID with meals, Metformin 1000 mg BID, Glipizide 10 mg BID Current orders for Inpatient glycemic control: Levemir 45 units QHS, Novolog 0-9 units TID with meals, Novolog 0-5 units QHS, Metformin 1000 mg BID, Glipizide 10 mg BID, Novolog 5 units tid with meals  Inpatient Diabetes Program Recommendations: Consider increasing the Levemir insulin to 50 units qhs and increase mealtime Novolog insulin to 8 units tid  I will get CM involved to see if Theodis SatoInvokana, ComorosFarxiga or Jardience are covered by insurance.  These are oral meds which might improve compliance.   RN Dedra SkeensGwen spoke to patient who verbalizes he does take insulin as outpatient.  Gwen tells me he was vague with his response and she  cannot be sure he is actually taking it.   Susette RacerJulie Shaunte Tuft, RN, BA, MHA, CDE Diabetes Coordinator Inpatient Diabetes Program  [phone] (Team Pager) [phone] [facility] Office) 02/07/2016 1:00 PM

## 2016-02-07 NOTE — [facility] Group Notes (Signed)
[facility] Group Notes:  (Nursing/MHT/Case Management/Adjunct)  Date:  02/07/2016  Time:  3:22 AM  Type of Therapy:  Psychoeducational Skills  Participation Level:  Did Not Attend  Summary of Progress/Problems:  Joseph MilroyLaquanda Y Hilde Vasquez 02/07/2016, 3:22 AM

## 2016-02-07 NOTE — Plan of Care (Signed)
[facility]: [facility] Participation in Recreation Therapeutic Interventions Goal: STG-Patient will demonstrate improved self esteem by identif STG: Self-Esteem - Within 4 treatment sessions, patient will verbalize at least 5 positive affirmation statements in each of 2 treatment sessions to increase self-esteem post d/c.  Outcome: Not Progressing Treatment Session 1; Completed 0 out 2: At approximately 11:40 am, LRT attempted treatment session. Patient refused stating he did not want to talk.  Jacquelynn CreeElizabeth M Kadeen Sroka, LRT/CTRS 07.13.17 12:43 pm Goal: STG-Other Recreation Therapy Goal (Specify) STG: Stress Management - Within 4 treatment sessions, patient will verbalize understanding of the stress management techniques in each of 2 treatment sessions to increase stress management skills post d/c.  Outcome: Not Progressing Treatment Session 1; Completed 0 out 2: At approximately 11:40 am, LRT attempted treatment session. Patient refused stating he did not want to talk.  Jacquelynn CreeElizabeth M Kaylene Dawn, LRT/CTRS 07.13.17 12:43 pm

## 2016-02-07 NOTE — Progress Notes (Signed)
CSW spoke to Wynonia SoursJohnny Fox, a fellow church member from the pt's fast who assured the CSw the pt can come to live with him in [place].  CSW educated Mr. Fox on [facility] and how the pt needs to [facility] to be assessed for for medication management, substance abuse treatment and therapy and gave Mr. Fox the CSW's number should Mr. Fox need info or assistance.  Mr. Caryn SectionFox stated the pt can go to his home to live on 02/08/16 if the pt is discharged. CSW spoke to the pt to affirm this plan and the pt denies SI/HI and AVH.  Pt reports he no longer wants inpatient SA tx.    Dorothe PeaJonathan F. Odean Mcelwain, LCSWA, LCAS  02/07/16

## 2016-02-07 NOTE — Tx Team (Signed)
Interdisciplinary Treatment Plan Update (Adult)         Date: 02/07/2016   Time Reviewed: 9:30 AM   Progress in Treatment: Improving Attending groups: Yes  Participating in groups: Yes  Taking medication as prescribed: Yes  Tolerating medication: Yes  Family/Significant other contact made: Yes, CSW has spoken with Chinita Greenland, the pt's friend who the pt will live with in [facility]  Patient understands diagnosis: Yes  Discussing patient identified problems/goals with staff: Yes  Medical problems stabilized or resolved: Yes  Denies suicidal/homicidal ideation: Yes  Issues/concerns per patient self-inventory: Yes  Other:   New problem(s) identified: N/A   Discharge Plan or Barriers: Pt will discharge to [facility] to live with a fellow church member and will follow up with [facility] for for medication management, substance abuse treatment and therapy.   Reason for Continuation of Hospitalization:   Depression   Anxiety   Medication Stabilization   Comments: N/A   Estimated date of discharge:  02/08/16    Patient is a 55 year old with history of schizoaffective disorder and substance use.  Chief complaint. "I'm suicidal."  History of present illness. Information was obtained from the patient and the chart. The patient has a long history of depression, mood instability and substance use with multiple psychiatric admissions and medication trials. For the past several months he has been maintained on a combination of Zoloft and Abilify with adequate response. She reports good compliance with medications and his providers at [facility] 2 weeks ago his stepson was killed. The killer was never found and the patient became increasingly depressed with poor sleep, decreased appetite, anhedonia, feeling of guilt of energy and concentration, crying spells and social isolation. She felt that medications would no longer working. For the past 5 days he experienced auditory hallucinations commanding him  to kill himself and paranoia. The family is removed his gun from the house and the patient came to the emergency room asking for help. He reports panic attacks, social anxiety and PTSD type of symptoms. There is no OCD. He denies alcohol use but admits to smoking crack and marijuana.  Past psychiatric history. There were multiple psychiatric hospitalization he claims for both depression and mania. He's been tried on numerous medications. He remembers lithium, Depakote, Tegretol, Seroquel and Risperdal. He attempted suicide twice once by cutting once by overdose. He attempted substance abuse treatment several times. His last treatment was in [place] where he stayed in the program for 4 months until January 2017. Marland Kitchen Patient lives in [place].  Patient will benefit from crisis stabilization, medication evaluation, group therapy, and psycho education in addition to case management for discharge planning. Patient and CSW reviewed pt's identified goals and treatment plan. Pt verbalized understanding and agreed to treatment plan.    Review of initial/current patient goals per problem list:  1. Goal(s): Patient will participate in aftercare plan   Met: Yes  Target date: 3-5 days post admission date   As evidenced by: Patient will participate within aftercare plan AEB aftercare provider and housing plan at discharge being identified.   7/14: Pt will discharge to [facility] to live with a fellow church member and will follow up with [facility] for for medication management, substance abuse treatment and therapy.     2. Goal (s): Patient will exhibit decreased depressive symptoms and suicidal ideations.   Met: Adequate for discharge per MD.  Target date: 3-5 days post admission date   As evidenced by: Patient will utilize self-rating of depression at 3  or below and demonstrate decreased signs of depression or be deemed stable for discharge by MD.   7/14: Adequate for discharge per MD.    3. Goal(s):  Patient will demonstrate decreased signs and symptoms of anxiety.   Met: Adequate for discharge per MD.  Target date: 3-5 days post admission date   As evidenced by: Patient will utilize self-rating of anxiety at 3 or below and demonstrated decreased signs of anxiety, or be deemed stable for discharge by MD   7/14: Adequate for discharge per MD.   4. Goal(s): Patient will demonstrate decreased signs of withdrawal due to substance abuse   Met: Yes  Target date: 3-5 days post admission date   As evidenced by: Patient will produce a CIWA/COWS score of 0, have stable vitals signs, and no symptoms of withdrawal   7/14: Patient produced a CIWA/COWS score of 0, has stable vitals signs, and no symptoms of withdrawal     5. Goal(s): Patient will demonstrate decreased signs of psychosis  * Met: Adequate for discharge per MD. * Target date: 3-5 days post admission date  * As evidenced by: Patient will demonstrate decreased frequency of AVH or return to baseline function   7/14: Adequate for discharge per MD.    Attendees:  Patient:  Family:  Physician: Dr. Bary Leriche , MD    02/08/2016 9:30 AM  Nursing: Geanie Berlin, RN     02/08/2016 9:30 AM  Clinical Social Worker: Marylou Flesher, [place]  02/08/2016 9:30 AM  Clinical Social Worker: [place], [place]  02/08/2016 9:30 AM  Other:        02/08/2016 9:30 AM  Other:        02/08/2016 9:30 AM  Other:        02/08/2016 9:30 AM   Alphonse Guild. Ariv Penrod, LCSWA, LCAS  02/08/16

## 2016-02-07 NOTE — [facility] Group Notes (Addendum)
Goals Group Date/Time: 02/07/16 9am Type of Therapy and Topic: Group Therapy: Goals Group: SMART Goals   Participation Level: Moderate  Description of Group:    The purpose of a daily goals group is to assist and guide patients in setting recovery/wellness-related goals. The objective is to set goals as they relate to the crisis in which they were admitted. Patients will be using SMART goal modalities to set measurable goals. Characteristics of realistic goals will be discussed and patients will be assisted in setting and processing how one will reach their goal. Facilitator will also assist patients in applying interventions and coping skills learned in psycho-education groups to the SMART goal and process how one will achieve defined goal.   Therapeutic Goals:   -Patients will develop and document one goal related to or their crisis in which brought them into treatment.  -Patients will be guided by LCSW using SMART goal setting modality in how to set a measurable, attainable, realistic and time sensitive goal.  -Patients will process barriers in reaching goal.  -Patients will process interventions in how to overcome and successful in reaching goal.   Patient's Goal: Pt was polite and cooperative with the CSW and other group members and focused and attentive to the topics discussed and the sharing of others.  Pt presented as pleasant and calm, with a flat affect and sharing was appropriate.  Pt shared the pt's goal is to remain clean, sober and free from the use of alcohol and other substances.  Pt shared that creating healthy boundaries with others is key to the pt's success with sobriety.  Therapeutic Modalities:  Motivational Interviewing  Research officer, political partyCognitive Behavioral Therapy  Crisis Intervention Model  SMART goals setting   Dorothe PeaJonathan F. Rhandi Despain, LCSWA, LCAS

## 2016-02-08 LAB — GLUCOSE, CAPILLARY
Glucose-Capillary: 194 mg/dL — ABNORMAL HIGH (ref 65–99)
Glucose-Capillary: 297 mg/dL — ABNORMAL HIGH (ref 65–99)

## 2016-02-08 LAB — TSH: TSH: 1.276 u[IU]/mL (ref 0.350–4.500)

## 2016-02-08 LAB — LIPID PANEL
Cholesterol: 213 mg/dL — ABNORMAL HIGH (ref 0–200)
HDL: 42 mg/dL (ref >40)
LDL CALC: 146 mg/dL — AB (ref 0–99)
TRIGLYCERIDES: 127 mg/dL (ref <150)
Total CHOL/HDL Ratio: 5.1 RATIO
VLDL: 25 mg/dL (ref 0–40)

## 2016-02-08 LAB — HEMOGLOBIN A1C: HEMOGLOBIN A1C: 11.8 % — AB (ref 4.0–6.0)

## 2016-02-08 MED ORDER — GLIPIZIDE 10 MG PO TABS: 10.0000 mg | ORAL_TABLET | Freq: Two times a day (BID) | ORAL | Status: DC

## 2016-02-08 MED ORDER — METFORMIN HCL 1000 MG PO TABS: 1000.0000 mg | ORAL_TABLET | Freq: Two times a day (BID) | ORAL | Status: DC

## 2016-02-08 MED ORDER — INSULIN ASPART 100 UNIT/ML ~~LOC~~ SOLN: 8.0000 [IU] | Freq: Three times a day (TID) | SUBCUTANEOUS | Status: DC

## 2016-02-08 MED ORDER — SIMVASTATIN 40 MG PO TABS: 40.0000 mg | ORAL_TABLET | Freq: Every day | ORAL | Status: DC

## 2016-02-08 MED ORDER — VENLAFAXINE HCL ER 150 MG PO CP24: 150.0000 mg | ORAL_CAPSULE | Freq: Every day | ORAL | Status: DC

## 2016-02-08 MED ORDER — INSULIN DETEMIR 100 UNIT/ML ~~LOC~~ SOLN: 50.0000 [IU] | Freq: Every day | SUBCUTANEOUS | Status: DC

## 2016-02-08 MED ORDER — ZIPRASIDONE HCL 40 MG PO CAPS: 40.0000 mg | ORAL_CAPSULE | Freq: Two times a day (BID) | ORAL | Status: DC

## 2016-02-08 MED ORDER — ZOLPIDEM TARTRATE 5 MG PO TABS: 5.0000 mg | ORAL_TABLET | Freq: Every evening | ORAL | Status: DC | PRN

## 2016-02-08 NOTE — [facility] Suicide Risk Assessment (Signed)
[facility] INPATIENT:  Family/Significant Other Suicide Prevention Education  Suicide Prevention Education:  Patient Refusal for Family/Significant Other Suicide Prevention Education: The patient [name] has refused to provide written consent for family/significant other to be provided Family/Significant Other Suicide Prevention Education during admission and/or prior to discharge.  Physician notified.  Pt refused SPE from the CSW and stated, "I've heard it all before I don't need to hear it again".  Dorothe PeaJonathan F Shontavia Mickel 02/08/2016, 11:47 AM

## 2016-02-08 NOTE — [facility] Group Notes (Signed)
[facility] Group Notes:  (Nursing/MHT/Case Management/Adjunct)  Date:  02/08/2016  Time:  3:35 PM  Type of Therapy:  Movement Therapy  Participation Level:  Minimal  Participation Quality:  Attentive  Affect:  Appropriate  Cognitive:  Appropriate  Insight:  Good  Engagement in Group:  Limited  Modes of Intervention:  Socialization  Summary of Progress/Problems:  Dinnis Rog De'Chelle Deserae Jennings 02/08/2016, 3:35 PM

## 2016-02-08 NOTE — Plan of Care (Signed)
Problem: [facility] Participation in Recreation Therapeutic Interventions Goal: STG-Patient will demonstrate improved self esteem by identif STG: Self-Esteem - Within 4 treatment sessions, patient will verbalize at least 5 positive affirmation statements in each of 2 treatment sessions to increase self-esteem post d/c.  Outcome: Adequate for Discharge Treatment Session 2; Completed 1 out of 2: At approximately 12:40 pm, LRT met with patient in patient room. Patient verbalized 5 positive affirmation statements. Patient reported it felt "good". LRT encouraged patient to continue saying positive affirmation statements.  Leon[facility], LRT/CTRS 07.14.17 3:04 pm Goal: STG-Other Recreation Therapy Goal (Specify) STG: Stress Management - Within 4 treatment sessions, patient will verbalize understanding of the stress management techniques in each of 2 treatment sessions to increase stress management skills post d/c.  Outcome: Adequate for Discharge Treatment Session 2: Completed 1 out of 2: At approximately 12:40 pm, LRT met with patient in patient room. LRT educated and provided patient with handouts on stress management techniques. Patient verbalized understanding. LRT encouraged patient to read over and practice the stress management techniques.  Leonette [facility], LRT/CTRS 07.14.17 3:05 pm

## 2016-02-08 NOTE — Discharge Summary (Signed)
Physician Discharge Summary Note  Patient:  [name] is an 55 y.o., male MRN:  [id number] DOB:  1961/05/24 Patient phone:  [phone] (home)  Patient address:   [address] [place] [address],  Total Time spent with patient: 30 minutes  Date of Admission:  02/05/2016 Date of Discharge: 02/08/2016  Reason for Admission:  Suicidal ideation.Identifying data. [name] is a 55 year old male with history of schizoaffective disorder and substance use.  Chief complaint. "I'm suicidal."  History of present illness. Information was obtained from the patient and the chart. The patient has a long history of depression, mood instability and substance use with multiple psychiatric admissions and medication trials. For the past several months he has been maintained on a combination of Zoloft and Abilify with adequate response. She reports good compliance with medications and his providers at [facility]. 2 weeks ago his stepson was killed. The killer was never found and the patient became increasingly depressed with poor sleep, decreased appetite, anhedonia, feeling of guilt of energy and concentration, crying spells and social isolation. She felt that medications would no longer working. For the past 5 days he experienced auditory hallucinations commanding him to kill himself and paranoia. The family is removed his gun from the house and the patient came to the emergency room asking for help. He reports panic attacks, social anxiety and PTSD type of symptoms. There is no OCD. He denies alcohol use but admits to smoking crack and marijuana.  Past psychiatric history. There were multiple psychiatric hospitalization he claims for both depression and mania. He's been tried on numerous medications. He remembers lithium, Depakote, Tegretol, Seroquel and Risperdal. He attempted suicide twice once by cutting once by overdose. He attempted substance abuse treatment several times. His last treatment was in [place]  where he stayed in the program for 4 months until January 2017.  Family psychiatric history. Sister with schizophrenia.  Social history. He is disabled from mental illness. He lives with his mother.  Principal Problem: Schizoaffective disorder, bipolar type [facility]) Discharge Diagnoses: Patient Active Problem List   Diagnosis Date Noted  . Suicidal ideation [R45.851] 02/06/2016  . Cannabis use disorder, moderate, dependence (HCC) [F12.20] 02/06/2016  . Schizoaffective disorder, bipolar type (HCC) [F25.0] 02/05/2016  . Cocaine use disorder, moderate, dependence (HCC) [F14.20] 08/03/2015  . Hyperlipidemia [E78.5] 01/18/2015  . Hyperprolactinemia (HCC) [E22.1] 01/18/2015  . Diabetes mellitus (HCC) [E11.9] 03/11/2012  . HTN (hypertension) [I10] 03/11/2012   Past Medical History:  Past Medical History  Diagnosis Date  . Diabetes mellitus   . Bipolar affective disorder (HCC)   . Schizophrenia, schizo-affective (HCC)   . Hypertension   . Diabetes mellitus (HCC) 03/11/2012  . Bipolar disorder (HCC) 03/11/2012  . Anxiety   . Depression   . 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 Finger surgery    . Wisdom tooth extraction     Family History:  Family History  Problem Relation Age of Onset  . Diabetes Mother   . Bipolar disorder Sister     Social History:  History  Alcohol Use No     History  Drug Use  . Yes  . Special: Marijuana, Cocaine    Comment: 4 days ago last THC and cocoaine use... relapsed after 2 yrs     Social History   Social History  . Marital Status: Single    Spouse Name: N/A  . Number of Children: N/A  . Years of Education:  N/A   Social History Main Topics  . Smoking status: Never Smoker   . Smokeless tobacco: Never Used  . Alcohol Use: No  . Drug Use: Yes    Special: Marijuana, Cocaine     Comment: 4 days ago last THC and cocoaine use... relapsed after 2 yrs   . Sexual Activity: Yes    Birth Control/  Protection: Condom     Comment: marijuana 25 days ago   Other Topics Concern  . None   Social History Narrative    Hospital Course:     [name] is a 55 year old male with history of depression, psychosis mood instability and substance admitted for suicidal and homicidal threats in the context of recent loss, substance abuse, and treatment noncompliance.  1. Suicidal ideation. This has resolved. The patient is able to contract for safety. He is forward thinking and optimistic about the future.  2. Mood. We started Effexor for depression and Geodon for mood stabiization and psychosis.  3. Diabetes. He is on glipizide, metformin, insulin, ADA diet, blood glucose monitoring and sliding insulin. Dose of insulin were adjusted with kind help from diabetes nurse coordinator.   4. Dyslipidemia. He is on Zocor.  5. Insomnia. He is on Ambien.  6. Substance abuse treatment. He is positive for cocaine and cannabinoids. He minimizes his problemsand declines residential treatment.  7. Metabolic syndrome screening. Lipid panel shows elevated cholesterol. TSH is normal. Hemoglobin A1c and prolactin are pending.   8. Disposition. The patient no longer wants to be placed. He will be discharged to her friend's house. He will follow up with [facility].   Physical Findings: AIMS: Facial and Oral Movements Muscles of Facial Expression: None, normal Lips and Perioral Area: None, normal Jaw: None, normal Tongue: None, normal,Extremity Movements Upper (arms, wrists, hands, fingers): None, normal Lower (legs, knees, ankles, toes): None, normal, Trunk Movements Neck, shoulders, hips: None, normal, Overall Severity Severity of abnormal movements (highest score from questions above): None, normal Incapacitation due to abnormal movements: None, normal Patient's awareness of abnormal movements (rate only patient's report): No Awareness, Dental Status Current problems with teeth and/or dentures?: No Does  patient usually wear dentures?: No  CIWA:  CIWA-Ar Total: 0 COWS:  COWS Total Score: 0  Musculoskeletal: Strength & Muscle Tone: within normal limits Gait & Station: normal Patient leans: N/A  Psychiatric Specialty Exam: Physical Exam  Nursing note and vitals reviewed.   Review of Systems  All other systems reviewed and are negative.   Blood pressure 131/86, pulse 65, temperature 98.3 F (36.8 C), temperature source Oral, resp. rate 18, height 5' 9.29" (1.76 m), weight 101.039 kg (222 lb 12 oz), SpO2 96 %.Body mass index is 32.62 kg/(m^2).   See SRA.                                                  Sleep:  Number of Hours: 8.3     Have you used any form of tobacco in the last 30 days? (Cigarettes, Smokeless Tobacco, Cigars, and/or Pipes): No  Has this patient used any form of tobacco in the last 30 days? (Cigarettes, Smokeless Tobacco, Cigars, and/or Pipes) Yes, No  Blood Alcohol level:  Lab Results  Component Value Date   [facility] <5 02/04/2016   ETH <5 08/01/2015    Metabolic Disorder Labs:  Lab Results  Component  Value Date   HGBA1C 11.2* 08/03/2015   MPG 275 08/03/2015   MPG 295 01/17/2015   Lab Results  Component Value Date   PROLACTIN 26.5* 01/17/2015   Lab Results  Component Value Date   CHOL 213* 02/08/2016   TRIG 127 02/08/2016   HDL 42 02/08/2016   CHOLHDL 5.1 02/08/2016   VLDL 25 02/08/2016   LDLCALC 146* 02/08/2016   LDLCALC 183* 08/03/2015    See Psychiatric Specialty Exam and Suicide Risk Assessment completed by Attending Physician prior to discharge.  Discharge destination:  Home  Is patient on multiple antipsychotic therapies at discharge:  No   Has Patient had three or more failed trials of antipsychotic monotherapy by history:  No  Recommended Plan for Multiple Antipsychotic Therapies: NA  Discharge Instructions    Diet - low sodium heart healthy    Complete by:  As directed      Increase activity slowly     Complete by:  As directed             Medication List    STOP taking these medications        ARIPiprazole 5 MG tablet  Commonly known as:  ABILIFY     hydrOXYzine 25 MG tablet  Commonly known as:  ATARAX/VISTARIL     insulin aspart 100 UNIT/ML FlexPen  Commonly known as:  NOVOLOG FLEXPEN  Replaced by:  insulin aspart 100 UNIT/ML injection     sertraline 100 MG tablet  Commonly known as:  ZOLOFT      TAKE these medications      Indication   glipiZIDE 10 MG tablet  Commonly known as:  GLUCOTROL  Take 1 tablet (10 mg total) by mouth 2 (two) times daily before a meal. For diabetes management   Indication:  Type 2 Diabetes     insulin aspart 100 UNIT/ML injection  Commonly known as:  novoLOG  Inject 8 Units into the skin 3 (three) times daily with meals.   Indication:  Type 2 Diabetes     insulin detemir 100 UNIT/ML injection  Commonly known as:  LEVEMIR  Inject 0.5 mLs (50 Units total) into the skin at bedtime.   Indication:  Type 2 Diabetes     metFORMIN 1000 MG tablet  Commonly known as:  GLUCOPHAGE  Take 1 tablet (1,000 mg total) by mouth 2 (two) times daily with a meal. For diabetes management   Indication:  Type 2 Diabetes     simvastatin 40 MG tablet  Commonly known as:  ZOCOR  Take 1 tablet (40 mg total) by mouth daily at 6 PM.   Indication:  Cerebrovascular Accident or Stroke, hyperlipidemia     venlafaxine XR 150 MG 24 hr capsule  Commonly known as:  EFFEXOR-XR  Take 1 capsule (150 mg total) by mouth daily with breakfast.   Indication:  Major Depressive Disorder     ziprasidone 40 MG capsule  Commonly known as:  GEODON  Take 1 capsule (40 mg total) by mouth 2 (two) times daily with a meal.   Indication:  Manic-Depression     zolpidem 5 MG tablet  Commonly known as:  AMBIEN  Take 1 tablet (5 mg total) by mouth at bedtime as needed for sleep.   Indication:  Trouble Sleeping           Follow-up Information    Go to [facility] [place].    Why:  Please arrive to the walk-in clinic Monday through Friday from 9-4pm for  your assessment for your hospital follow up for substance abuse treatment, medication management and therapy.   Contact information:   [address] [place], [place] [address] Phone: [phone] Fax: [phone]      Follow-up recommendations:  Activity:  As tolerated. Diet:  Low sodium heart healthy ADA diet. Other:  Keep follow-up appointments.  Comments:    Signed: Kristine LineaJolanta Asenath Balash, MD 02/08/2016, 11:02 AM

## 2016-02-08 NOTE — [facility] Suicide Risk Assessment (Signed)
[facility]   Principal Problem: Schizoaffective disorder, bipolar type [facility](HCC) Discharge Diagnoses:  Patient Active Problem List   Diagnosis Date Noted  . Suicidal ideation [R45.851] 02/06/2016  . Cannabis use disorder, moderate, dependence (HCC) [F12.20] 02/06/2016  . Schizoaffective disorder, bipolar type (HCC) [F25.0] 02/05/2016  . Cocaine use disorder, moderate, dependence (HCC) [F14.20] 08/03/2015  . Hyperlipidemia [E78.5] 01/18/2015  . Hyperprolactinemia (HCC) [E22.1] 01/18/2015  . Diabetes mellitus (HCC) [E11.9] 03/11/2012  . HTN (hypertension) [I10] 03/11/2012    Total Time spent with patient: 30 minutes  Musculoskeletal: Strength & Muscle Tone: within normal limits Gait & Station: normal Patient leans: N/A  Psychiatric Specialty Exam: Review of Systems  All other systems reviewed and are negative.   Blood pressure 131/86, pulse 65, temperature 98.3 F (36.8 C), temperature source Oral, resp. rate 18, height 5' 9.29" (1.76 m), weight 101.039 kg (222 lb 12 oz), SpO2 96 %.Body mass index is 32.62 kg/(m^2).  General Appearance: Casual  Eye Contact::  Good  Speech:  Clear and Coherent409  Volume:  Normal  Mood:  Euthymic  Affect:  Appropriate  Thought Process:  Goal Directed  Orientation:  Full (Time, Place, and Person)  Thought Content:  WDL  Suicidal Thoughts:  No  Homicidal Thoughts:  No  Memory:  Immediate;   Fair Recent;   Fair Remote;   Fair  Judgement:  Impaired  Insight:  Shallow  Psychomotor Activity:  Normal  Concentration:  Fair  Recall:  [employer]Fair  Fund of Knowledge:Fair  Language: Fair  Akathisia:  No  Handed:  Right  AIMS (if indicated):     Assets:  Communication Skills Desire for Improvement Financial Resources/Insurance Housing Resilience Social Support  Sleep:  Number of Hours: 8.3  Cognition: WNL  ADL's:  Intact   Mental Status Per Nursing Assessment::   On Admission:  Suicidal ideation indicated by  patient  Demographic Factors:  Male and Low socioeconomic status  Loss Factors: Decline in physical health and Financial problems/change in socioeconomic status  Historical Factors: Family history of mental illness or substance abuse and Impulsivity  Risk Reduction Factors:   Sense of responsibility to family, Living with another person, especially a relative and Positive social support  Continued Clinical Symptoms:  Bipolar Disorder:   Depressive phase Alcohol/Substance Abuse/Dependencies  Cognitive Features That Contribute To Risk:  None    Suicide Risk:  Minimal: No identifiable suicidal ideation.  Patients presenting with no risk factors but with morbid ruminations; may be classified as minimal risk based on the severity of the depressive symptoms  Follow-up Information    [place] [facility] [place].   Why:  Please arrive to the walk-in clinic Monday through Friday from 9-4pm for your assessment for your hospital follow up for substance abuse treatment, medication management and therapy.   Contact information:   [address] [place], [place] [address] Phone: [phone] Fax: [phone]      Plan Of Care/Follow-up recommendations:  Activity:  as tolerated. Diet:  low sodium heart healthy ADA diet. Other:  keep follow up appointment.  Kristine LineaJolanta Gelsey Amyx, MD 02/08/2016, 10:16 AM

## 2016-02-08 NOTE — [facility] Group Notes (Signed)
[facility] Group Notes:  (Nursing/MHT/Case Management/Adjunct)  Date:  02/08/2016  Time:  1:40 AM  Type of Therapy:  Group Therapy  Participation Level:  Did Not Attend   Summary of Progress/Problems:  [name] 02/08/2016, 1:40 AM

## 2016-02-08 NOTE — Progress Notes (Addendum)
  [facility] :  Will you be returning to the same living situation after discharge:  Yes,  [place]. At discharge, do you have transportation home?: Yes,  pt will be provided with bus passes Do you have the ability to pay for your medications: Yes,  pt will be provided with prescriptions at discharge  Release of information consent forms completed and in the chart;  Patient's signature needed at discharge.  Patient to Follow up at: Follow-up Information    [facility] [place].   Why:  Please arrive to the walk-in clinic Monday through Friday from 9-4pm for your assessment for your hospital follow up for substance abuse treatment, medication management and therapy.   Contact information:   [address] [place], [place] [address] Phone: [phone] Fax: [phone]) [phone]      Next level of care provider has [facility] to [facility] Link:no  Safety Planning and Suicide Prevention discussed: No. Pt refused  Have you used any form of tobacco in the last 30 days? (Cigarettes, Smokeless Tobacco, Cigars, and/or Pipes): No  Has patient been referred to the Quitline?: N/A patient is not a smoker  Patient has been referred for addiction treatment: Yes  [name] 02/08/2016, 11:49 AM

## 2016-02-08 NOTE — Progress Notes (Signed)
Patients's discharge teaching, instructions and medications discussed with patient. He verbalized understanding. His belongings were given to the patient without any concerns noted. At about 4:55pm patient was escorted to the parking lot where a courtesy car was waiting to take him to the Bus stop for his Bus. He was handed his bus ticket. He was excited to leave.

## 2016-02-08 NOTE — Progress Notes (Signed)
Recreation Therapy Notes  Date: 07.14.17 Time: 1:00 pm Location: Craft Room  Group Topic: Communication, Problem solving, Teamwork  Goal Area(s) Addresses:  Patient will effectively work with peer towards shared goal. Patient will identify skills used to make activity successful. Patient will identify benefit of using group skills effectively post d/c.  Behavioral Response: Did not attend  Intervention: [employer] Tower  Activity: Patients were given 15 pipe cleaners and instructed to build a free standing tower. Patients were given 2 minutes to strategize. After about 5 minutes of building, patients were instructed to put their dominant hand behind their back. After another 5 minutes of building, patients were instructed to stop talking to each other.  Education: LRT educated patients on healthy support systems.  Education Outcome: Patient did not attend group.   Clinical Observations/Feedback: Patient did not attend group.  Jacquelynn CreeGreene,Jaydah Stahle M, LRT/CTRS 02/08/2016 2:52 PM

## 2016-02-09 LAB — PROLACTIN: PROLACTIN: 3.4 ng/mL — AB (ref 4.0–15.2)

## 2016-02-28 ENCOUNTER — Other Ambulatory Visit: Payer: Self-pay

## 2016-02-28 ENCOUNTER — Emergency Department (HOSPITAL_COMMUNITY): Payer: Medicare Other

## 2016-02-28 ENCOUNTER — Inpatient Hospital Stay (HOSPITAL_COMMUNITY)
Admission: EM | Admit: 2016-02-28 | Discharge: 2016-03-03 | DRG: 638 | Payer: Medicare Other | Attending: Internal Medicine | Admitting: Internal Medicine

## 2016-02-28 ENCOUNTER — Encounter (HOSPITAL_COMMUNITY): Payer: Self-pay | Admitting: Emergency Medicine

## 2016-02-28 DIAGNOSIS — E785 Hyperlipidemia, unspecified: Secondary | ICD-10-CM | POA: Diagnosis present

## 2016-02-28 DIAGNOSIS — E1310 Other specified diabetes mellitus with ketoacidosis without coma: Secondary | ICD-10-CM | POA: Diagnosis not present

## 2016-02-28 DIAGNOSIS — F1420 Cocaine dependence, uncomplicated: Secondary | ICD-10-CM | POA: Diagnosis not present

## 2016-02-28 DIAGNOSIS — Z794 Long term (current) use of insulin: Secondary | ICD-10-CM

## 2016-02-28 DIAGNOSIS — B1920 Unspecified viral hepatitis C without hepatic coma: Secondary | ICD-10-CM | POA: Diagnosis not present

## 2016-02-28 DIAGNOSIS — Z9114 Patient's other noncompliance with medication regimen: Secondary | ICD-10-CM | POA: Diagnosis not present

## 2016-02-28 DIAGNOSIS — N179 Acute kidney failure, unspecified: Secondary | ICD-10-CM | POA: Diagnosis not present

## 2016-02-28 DIAGNOSIS — R079 Chest pain, unspecified: Secondary | ICD-10-CM

## 2016-02-28 DIAGNOSIS — F1220 Cannabis dependence, uncomplicated: Secondary | ICD-10-CM | POA: Diagnosis not present

## 2016-02-28 DIAGNOSIS — I10 Essential (primary) hypertension: Secondary | ICD-10-CM | POA: Diagnosis present

## 2016-02-28 DIAGNOSIS — R45851 Suicidal ideations: Secondary | ICD-10-CM

## 2016-02-28 DIAGNOSIS — F250 Schizoaffective disorder, bipolar type: Secondary | ICD-10-CM | POA: Diagnosis present

## 2016-02-28 DIAGNOSIS — R45850 Homicidal ideations: Secondary | ICD-10-CM | POA: Diagnosis not present

## 2016-02-28 DIAGNOSIS — R0789 Other chest pain: Secondary | ICD-10-CM | POA: Diagnosis not present

## 2016-02-28 DIAGNOSIS — E869 Volume depletion, unspecified: Secondary | ICD-10-CM | POA: Diagnosis present

## 2016-02-28 DIAGNOSIS — E0810 Diabetes mellitus due to underlying condition with ketoacidosis without coma: Secondary | ICD-10-CM | POA: Diagnosis not present

## 2016-02-28 DIAGNOSIS — E1110 Type 2 diabetes mellitus with ketoacidosis without coma: Secondary | ICD-10-CM | POA: Diagnosis present

## 2016-02-28 LAB — BASIC METABOLIC PANEL
ANION GAP: 13 (ref 5–15)
Anion gap: 24 — ABNORMAL HIGH (ref 5–15)
BUN: 18 mg/dL (ref 6–20)
BUN: 15 mg/dL (ref 6–20)
CHLORIDE: 98 mmol/L — AB (ref 101–111)
CO2: 8 mmol/L — ABNORMAL LOW (ref 22–32)
CO2: 19 mmol/L — ABNORMAL LOW (ref 22–32)
Calcium: 9.1 mg/dL (ref 8.9–10.3)
Calcium: 9.9 mg/dL (ref 8.9–10.3)
Chloride: 91 mmol/L — ABNORMAL LOW (ref 101–111)
Creatinine, Ser: 2.24 mg/dL — ABNORMAL HIGH (ref 0.61–1.24)
Creatinine, Ser: 1.6 mg/dL — ABNORMAL HIGH (ref 0.61–1.24)
GFR calc Af Amer: 36 mL/min — ABNORMAL LOW (ref >60)
GFR calc Af Amer: 54 mL/min — ABNORMAL LOW (ref >60)
GFR, EST NON AFRICAN AMERICAN: 31 mL/min — AB (ref >60)
GFR, EST NON AFRICAN AMERICAN: 47 mL/min — AB (ref >60)
GLUCOSE: 992 mg/dL — AB (ref 65–99)
GLUCOSE: 355 mg/dL — AB (ref 65–99)
POTASSIUM: 5 mmol/L (ref 3.5–5.1)
POTASSIUM: 4 mmol/L (ref 3.5–5.1)
SODIUM: 130 mmol/L — AB (ref 135–145)
Sodium: 123 mmol/L — ABNORMAL LOW (ref 135–145)

## 2016-02-28 LAB — I-STAT VENOUS BLOOD GAS, ED
ACID-BASE DEFICIT: 13 mmol/L — AB (ref 0.0–2.0)
BICARBONATE: 10 meq/L — AB (ref 20.0–24.0)
O2 Saturation: 99 %
PH VEN: 7.345 — AB (ref 7.250–7.300)
TCO2: 11 mmol/L (ref 0–100)
pCO2, Ven: 18.3 mmHg — ABNORMAL LOW (ref 45.0–50.0)
pO2, Ven: 166 mmHg — ABNORMAL HIGH (ref 31.0–45.0)

## 2016-02-28 LAB — PHOSPHORUS: PHOSPHORUS: 1.3 mg/dL — AB (ref 2.5–4.6)

## 2016-02-28 LAB — GLUCOSE, CAPILLARY
Glucose-Capillary: 433 mg/dL — ABNORMAL HIGH (ref 65–99)
Glucose-Capillary: 368 mg/dL — ABNORMAL HIGH (ref 65–99)
Glucose-Capillary: 512 mg/dL (ref 65–99)

## 2016-02-28 LAB — RAPID URINE DRUG SCREEN, HOSP PERFORMED
AMPHETAMINES: NOT DETECTED
BENZODIAZEPINES: NOT DETECTED
Barbiturates: NOT DETECTED
COCAINE: POSITIVE — AB
OPIATES: NOT DETECTED
TETRAHYDROCANNABINOL: POSITIVE — AB

## 2016-02-28 LAB — URINALYSIS, ROUTINE W REFLEX MICROSCOPIC
Bilirubin Urine: NEGATIVE
Glucose, UA: >1000 mg/dL — AB
Hgb urine dipstick: NEGATIVE
Ketones, ur: >80 mg/dL — AB
LEUKOCYTES UA: NEGATIVE
NITRITE: NEGATIVE
PH: 5 (ref 5.0–8.0)
Protein, ur: NEGATIVE mg/dL
SPECIFIC GRAVITY, URINE: 1.036 — AB (ref 1.005–1.030)

## 2016-02-28 LAB — URINE MICROSCOPIC-ADD ON
Bacteria, UA: NONE SEEN
RBC / HPF: NONE SEEN RBC/hpf (ref 0–5)

## 2016-02-28 LAB — LIPASE, BLOOD: Lipase: 22 U/L (ref 11–51)

## 2016-02-28 LAB — I-STAT CHEM 8, ED
BUN: 25 mg/dL — ABNORMAL HIGH (ref 6–20)
CHLORIDE: 97 mmol/L — AB (ref 101–111)
CREATININE: 1.6 mg/dL — AB (ref 0.61–1.24)
Calcium, Ion: 1.14 mmol/L (ref 1.13–1.30)
HCT: 40 % (ref 39.0–52.0)
HEMOGLOBIN: 13.6 g/dL (ref 13.0–17.0)
POTASSIUM: 5.4 mmol/L — AB (ref 3.5–5.1)
Sodium: 126 mmol/L — ABNORMAL LOW (ref 135–145)
TCO2: 12 mmol/L (ref 0–100)

## 2016-02-28 LAB — CBC WITH DIFFERENTIAL/PLATELET
Basophils Absolute: 0 10*3/uL (ref 0.0–0.1)
Basophils Relative: 0 %
EOS PCT: 0 %
Eosinophils Absolute: 0 10*3/uL (ref 0.0–0.7)
HCT: 38.6 % — ABNORMAL LOW (ref 39.0–52.0)
Hemoglobin: 13.8 g/dL (ref 13.0–17.0)
LYMPHS ABS: 0.7 10*3/uL (ref 0.7–4.0)
LYMPHS PCT: 9 %
MCH: 29.5 pg (ref 26.0–34.0)
MCHC: 35.8 g/dL (ref 30.0–36.0)
MCV: 82.5 fL (ref 78.0–100.0)
MONO ABS: 0.4 10*3/uL (ref 0.1–1.0)
MONOS PCT: 5 %
Neutro Abs: 6.8 10*3/uL (ref 1.7–7.7)
Neutrophils Relative %: 86 %
PLATELETS: 245 10*3/uL (ref 150–400)
RBC: 4.68 MIL/uL (ref 4.22–5.81)
WBC: 8.6 10*3/uL (ref 4.0–10.5)

## 2016-02-28 LAB — CBG MONITORING, ED
Glucose-Capillary: >600 mg/dL (ref 65–99)
Glucose-Capillary: >600 mg/dL (ref 65–99)
Glucose-Capillary: 570 mg/dL (ref 65–99)

## 2016-02-28 LAB — MRSA PCR SCREENING: MRSA BY PCR: NEGATIVE

## 2016-02-28 LAB — HEPATIC FUNCTION PANEL
ALBUMIN: 3.9 g/dL (ref 3.5–5.0)
ALK PHOS: 65 U/L (ref 38–126)
ALT: 16 U/L — AB (ref 17–63)
AST: 17 U/L (ref 15–41)
BILIRUBIN TOTAL: 1.4 mg/dL — AB (ref 0.3–1.2)
Total Protein: 7.7 g/dL (ref 6.5–8.1)

## 2016-02-28 LAB — I-STAT TROPONIN, ED: Troponin i, poc: 0.02 ng/mL (ref 0.00–0.08)

## 2016-02-28 LAB — TROPONIN I

## 2016-02-28 LAB — MAGNESIUM: Magnesium: 1.9 mg/dL (ref 1.7–2.4)

## 2016-02-28 LAB — CK: CK TOTAL: 108 U/L (ref 49–397)

## 2016-02-28 LAB — BETA-HYDROXYBUTYRIC ACID: BETA-HYDROXYBUTYRIC ACID: 2.99 mmol/L — AB (ref 0.05–0.27)

## 2016-02-28 MED ORDER — SODIUM CHLORIDE 0.9 % IV SOLN
INTRAVENOUS | Status: DC
  Administered 2016-02-28 – 2016-02-29 (×2): via INTRAVENOUS

## 2016-02-28 MED ORDER — KCL-LACTATED RINGERS 20 MEQ/L IV SOLN
INTRAVENOUS | Status: DC
  Filled 2016-02-28: qty 1000

## 2016-02-28 MED ORDER — ENOXAPARIN SODIUM 30 MG/0.3ML ~~LOC~~ SOLN
30.0000 mg | SUBCUTANEOUS | Status: DC
  Administered 2016-02-28: 30 mg via SUBCUTANEOUS
  Filled 2016-02-28: qty 0.3

## 2016-02-28 MED ORDER — ACETAMINOPHEN 325 MG PO TABS
650.0000 mg | ORAL_TABLET | ORAL | Status: DC | PRN
  Administered 2016-03-01: 650 mg via ORAL
  Filled 2016-02-28: qty 2

## 2016-02-28 MED ORDER — SODIUM CHLORIDE 0.9 % IV SOLN: INTRAVENOUS | Status: DC

## 2016-02-28 MED ORDER — DEXTROSE-NACL 5-0.45 % IV SOLN
INTRAVENOUS | Status: DC
  Administered 2016-02-29: via INTRAVENOUS

## 2016-02-28 MED ORDER — SODIUM CHLORIDE 0.9 % IV SOLN
INTRAVENOUS | Status: DC
  Administered 2016-02-28: 19:00:00 via INTRAVENOUS
  Filled 2016-02-28: qty 2.5

## 2016-02-28 MED ORDER — LORAZEPAM 2 MG/ML IJ SOLN
0.5000 mg | Freq: Once | INTRAMUSCULAR | Status: AC
  Administered 2016-02-28: 0.5 mg via INTRAVENOUS
  Filled 2016-02-28: qty 1

## 2016-02-28 MED ORDER — ACETAMINOPHEN 500 MG PO TABS
1000.0000 mg | ORAL_TABLET | Freq: Once | ORAL | Status: AC
  Administered 2016-02-28: 1000 mg via ORAL
  Filled 2016-02-28: qty 2

## 2016-02-28 MED ORDER — POTASSIUM CHLORIDE 10 MEQ/100ML IV SOLN
10.0000 meq | INTRAVENOUS | Status: AC
  Administered 2016-02-28 (×2): 10 meq via INTRAVENOUS
  Filled 2016-02-28 (×2): qty 100

## 2016-02-28 MED ORDER — LACTATED RINGERS IV BOLUS (SEPSIS)
2000.0000 mL | Freq: Once | INTRAVENOUS | Status: AC
  Administered 2016-02-28: 2000 mL via INTRAVENOUS

## 2016-02-28 MED ORDER — HYDROMORPHONE HCL 1 MG/ML IJ SOLN
0.3000 mg | Freq: Once | INTRAMUSCULAR | Status: AC
  Administered 2016-02-28: 0.3 mg via INTRAVENOUS
  Filled 2016-02-28: qty 1

## 2016-02-28 MED ORDER — VENLAFAXINE HCL ER 75 MG PO CP24
150.0000 mg | ORAL_CAPSULE | Freq: Every day | ORAL | Status: DC
  Administered 2016-03-01 – 2016-03-03 (×3): 150 mg via ORAL
  Filled 2016-02-28 (×4): qty 2

## 2016-02-28 MED ORDER — ZOLPIDEM TARTRATE 5 MG PO TABS
5.0000 mg | ORAL_TABLET | Freq: Every evening | ORAL | Status: DC | PRN
  Administered 2016-02-28 – 2016-03-01 (×2): 5 mg via ORAL
  Filled 2016-02-28 (×2): qty 1

## 2016-02-28 MED ORDER — SIMVASTATIN 40 MG PO TABS
40.0000 mg | ORAL_TABLET | Freq: Every day | ORAL | Status: DC
  Administered 2016-02-29 – 2016-03-03 (×4): 40 mg via ORAL
  Filled 2016-02-28 (×5): qty 1

## 2016-02-28 MED ORDER — LORAZEPAM BOLUS VIA INFUSION: 0.5000 mg | Freq: Once | INTRAVENOUS | Status: DC

## 2016-02-28 MED ORDER — INSULIN ASPART 100 UNIT/ML ~~LOC~~ SOLN
10.0000 [IU] | Freq: Once | SUBCUTANEOUS | Status: AC
  Administered 2016-02-28: 10 [IU] via INTRAVENOUS
  Filled 2016-02-28: qty 1

## 2016-02-28 MED ORDER — ZIPRASIDONE HCL 40 MG PO CAPS
40.0000 mg | ORAL_CAPSULE | Freq: Two times a day (BID) | ORAL | Status: DC
  Administered 2016-02-29 – 2016-03-03 (×7): 40 mg via ORAL
  Filled 2016-02-28 (×10): qty 1

## 2016-02-28 MED ORDER — ASPIRIN 81 MG PO CHEW: 324.0000 mg | CHEWABLE_TABLET | Freq: Once | ORAL | Status: AC

## 2016-02-28 MED ORDER — NITROGLYCERIN 2 % TD OINT
0.5000 [in_us] | TOPICAL_OINTMENT | Freq: Four times a day (QID) | TRANSDERMAL | Status: DC
  Administered 2016-02-28 – 2016-03-03 (×16): 0.5 [in_us] via TOPICAL
  Filled 2016-02-28 (×2): qty 30

## 2016-02-28 MED ORDER — NITROGLYCERIN 0.4 MG SL SUBL
0.4000 mg | SUBLINGUAL_TABLET | SUBLINGUAL | Status: DC | PRN
  Administered 2016-03-01 – 2016-03-03 (×2): 0.4 mg via SUBLINGUAL
  Filled 2016-02-28 (×2): qty 1

## 2016-02-28 MED ORDER — ONDANSETRON HCL 4 MG/2ML IJ SOLN: 4.0000 mg | Freq: Four times a day (QID) | INTRAMUSCULAR | Status: DC | PRN

## 2016-02-28 NOTE — ED Notes (Signed)
IV attempted x 1. Pt moving and cursing. States  "You don't know what the fuck you are doing." IV unsuccessful.

## 2016-02-28 NOTE — ED Notes (Signed)
cbg taken read over 600, RN notified

## 2016-02-28 NOTE — ED Triage Notes (Signed)
Patient comes with complaints of high blood sugar and chest pain. Patient states he has not taken his medication in 2 days. Patient alert and oriented x4 Patient complain of 10/10 chest pain . MD made aware.

## 2016-02-28 NOTE — ED Notes (Signed)
Pt states "iam suicidal, ya'll got to help me, I would shoot myself if I get out of here and I have a gun.

## 2016-02-28 NOTE — ED Provider Notes (Signed)
[facility]-EMERGENCY DEPT Provider Note   CSN: [id number] Arrival date & time: 02/28/16  1609  First Provider Contact:  None       History   Chief Complaint Chief Complaint  Patient presents with  . Hyperglycemia    HPI [name] is a 55 y.o. male.  55 yo M the chief complaint of hyperglycemia. This been going on for the past week or so. Patient is having chest pain associated with this as well. Describes it as sharp on over his chest without radiation. Worse on exertion and better with rest. Patient has had some diaphoresis and vomiting with this. Also short of breath. Never had this before. Denies prior history of heart attack, PE or DVT. Denies PE risk factors. Denies smoking hypertension or hyperlipidemia. No noted family history.    The history is provided by the patient.  Hyperglycemia  Severity:  Severe Onset quality:  Sudden Duration:  1 week Timing:  Constant Progression:  Worsening Chronicity:  New Diabetes status:  Controlled with insulin and controlled with oral medications Time since last antidiabetic medication:  3 days Relieved by:  Nothing Ineffective treatments:  None tried Associated symptoms: abdominal pain and chest pain   Associated symptoms: no confusion, no fever, no shortness of breath and no vomiting     Past Medical History:  Diagnosis Date  . Anxiety   . Bipolar affective disorder (HCC)   . Bipolar disorder (HCC) 03/11/2012  . Depression   . Diabetes mellitus   . Diabetes mellitus (HCC) 03/11/2012  . Hepatitis 06/30/2013   Type C  . Hypertension   . Schizophrenia, schizo-affective [facility])     Patient Active Problem List   Diagnosis Date Noted  . DKA (diabetic ketoacidoses) (HCC) 02/28/2016  . Chest pain 02/28/2016  . AKI (acute kidney injury) (HCC) 02/28/2016  . Suicidal ideation 02/06/2016  . Cannabis use disorder, moderate, dependence (HCC) 02/06/2016  . Schizoaffective disorder, bipolar type (HCC) 02/05/2016  . Cocaine use disorder,  moderate, dependence (HCC) 08/03/2015  . Hyperlipidemia 01/18/2015  . Hyperprolactinemia (HCC) 01/18/2015  . Diabetes mellitus (HCC) 03/11/2012  . HTN (hypertension) 03/11/2012    Past Surgical History:  Procedure Laterality Date  . FINGER SURGERY    . WISDOM TOOTH EXTRACTION         Home Medications    Prior to Admission medications   Medication Sig Start Date End Date Taking? Authorizing Provider  glipiZIDE (GLUCOTROL) 10 MG tablet Take 1 tablet (10 mg total) by mouth 2 (two) times daily before a meal. For diabetes management 02/08/16   Shari Prows, MD  insulin aspart (NOVOLOG) 100 UNIT/ML injection Inject 8 Units into the skin 3 (three) times daily with meals. 02/08/16   Shari Prows, MD  insulin detemir (LEVEMIR) 100 UNIT/ML injection Inject 0.5 mLs (50 Units total) into the skin at bedtime. Patient taking differently: Inject 40 Units into the skin at bedtime.  02/08/16   Shari Prows, MD  metFORMIN (GLUCOPHAGE) 1000 MG tablet Take 1 tablet (1,000 mg total) by mouth 2 (two) times daily with a meal. For diabetes management 02/08/16   Shari Prows, MD  simvastatin (ZOCOR) 40 MG tablet Take 1 tablet (40 mg total) by mouth daily at 6 PM. 02/08/16   Jolanta B Pucilowska, MD  venlafaxine XR (EFFEXOR-XR) 150 MG 24 hr capsule Take 1 capsule (150 mg total) by mouth daily with breakfast. 02/08/16   Jolanta B Pucilowska, MD  ziprasidone (GEODON) 40 MG capsule Take 1 capsule (40  mg total) by mouth 2 (two) times daily with a meal. 02/08/16   Jolanta B Pucilowska, MD  zolpidem (AMBIEN) 5 MG tablet Take 1 tablet (5 mg total) by mouth at bedtime as needed for sleep. 02/08/16   Shari Prows, MD    Family History Family History  Problem Relation Age of Onset  . Diabetes Mother   . Bipolar disorder Sister     Social History Social History  Substance Use Topics  . Smoking status: Never Smoker  . Smokeless tobacco: Never Used  . Alcohol use No      Allergies   Wellbutrin [bupropion]   Review of Systems Review of Systems  Constitutional: Negative for chills and fever.  HENT: Negative for congestion and facial swelling.   Eyes: Negative for discharge and visual disturbance.  Respiratory: Negative for shortness of breath.   Cardiovascular: Positive for chest pain. Negative for palpitations.  Gastrointestinal: Positive for abdominal pain. Negative for diarrhea and vomiting.  Musculoskeletal: Negative for arthralgias and myalgias.  Skin: Negative for color change and rash.  Neurological: Negative for tremors, syncope and headaches.  Psychiatric/Behavioral: Negative for confusion and dysphoric mood.     Physical Exam Updated Vital Signs BP 126/82   Pulse 90   Temp 98.6 F (37 C) (Oral)   Resp 25   SpO2 100%   Physical Exam  Constitutional: He is oriented to person, place, and time. He appears well-developed and well-nourished.  HENT:  Head: Normocephalic and atraumatic.  Eyes: Conjunctivae and EOM are normal. Pupils are equal, round, and reactive to light.  Neck: Normal range of motion. No JVD present.  Cardiovascular: Normal rate and regular rhythm.   No murmur heard. Pulmonary/Chest: Effort normal. No stridor. No respiratory distress. He has no wheezes. He has no rales.  Abdominal: He exhibits no distension and no mass. There is no tenderness. There is no guarding.  Musculoskeletal: Normal range of motion. He exhibits no edema.  Neurological: He is alert and oriented to person, place, and time.  Skin: Skin is warm and dry.  Psychiatric: He has a normal mood and affect. His behavior is normal.     ED Treatments / Results  Labs (all labs ordered are listed, but only abnormal results are displayed) Labs Reviewed  CBC WITH DIFFERENTIAL/PLATELET - Abnormal; Notable for the following:       Result Value   HCT 38.6 (*)    All other components within normal limits  BASIC METABOLIC PANEL - Abnormal; Notable for  the following:    Sodium 123 (*)    Chloride 91 (*)    CO2 8 (*)    Glucose, Bld 992 (*)    Creatinine, Ser 2.24 (*)    GFR calc non Af Amer 31 (*)    GFR calc Af Amer 36 (*)    Anion gap 24 (*)    All other components within normal limits  URINALYSIS, ROUTINE W REFLEX MICROSCOPIC (NOT AT [facility]) - Abnormal; Notable for the following:    Specific Gravity, Urine 1.036 (*)    Glucose, UA >1000 (*)    Ketones, ur >80 (*)    All other components within normal limits  URINE RAPID DRUG SCREEN, HOSP PERFORMED - Abnormal; Notable for the following:    Cocaine POSITIVE (*)    Tetrahydrocannabinol POSITIVE (*)    All other components within normal limits  URINE MICROSCOPIC-ADD ON - Abnormal; Notable for the following:    Squamous Epithelial / LPF 0-5 (*)  All other components within normal limits  CBG MONITORING, ED - Abnormal; Notable for the following:    Glucose-Capillary >600 (*)    All other components within normal limits  I-STAT CHEM 8, ED - Abnormal; Notable for the following:    Sodium 126 (*)    Potassium 5.4 (*)    Chloride 97 (*)    BUN 25 (*)    Creatinine, Ser 1.60 (*)    Glucose, Bld >700 (*)    All other components within normal limits  CBG MONITORING, ED - Abnormal; Notable for the following:    Glucose-Capillary >600 (*)    All other components within normal limits  I-STAT VENOUS BLOOD GAS, ED - Abnormal; Notable for the following:    pH, Ven 7.345 (*)    pCO2, Ven 18.3 (*)    pO2, Ven 166.0 (*)    Bicarbonate 10.0 (*)    Acid-base deficit 13.0 (*)    All other components within normal limits  BLOOD GAS, VENOUS  HEPATIC FUNCTION PANEL  LIPASE, BLOOD  CK  I-STAT TROPOININ, ED  CBG MONITORING, ED  CBG MONITORING, ED    EKG  EKG Interpretation  Date/Time:  Thursday February 28 2016 16:07:01 EDT Ventricular Rate:  90 PR Interval:  178 QRS Duration: 92 QT Interval:  376 QTC Calculation: 459 R Axis:   56 Text Interpretation:  Normal sinus rhythm ST  elevation consider anterior injury or acute infarct Anterior elevation similar to 8 jan Otherwise no significant change Confirmed by Adela Lank MD, DANIEL [phone]) on 02/28/2016 4:32:49 PM       Radiology Dg Chest Port 1 View  Result Date: 02/28/2016 CLINICAL DATA:  Chest pain. EXAM: PORTABLE CHEST 1 VIEW COMPARISON:  Radiographs of February 26, 2013. FINDINGS: The heart size and mediastinal contours are within normal limits. Both lungs are clear. No pneumothorax or pleural effusion is noted. The visualized skeletal structures are unremarkable. IMPRESSION: No acute cardiopulmonary abnormality seen. Electronically Signed   By: Lupita Raider, M.D.   On: 02/28/2016 16:58    Procedures Procedures (including critical care time)  Medications Ordered in ED Medications  nitroGLYCERIN (NITROSTAT) SL tablet 0.4 mg (not administered)  insulin regular (NOVOLIN R,HUMULIN R) 250 Units in sodium chloride 0.9 % 250 mL (1 Units/mL) infusion ( Intravenous New Bag/Given 02/28/16 1832)  0.9 %  sodium chloride infusion (not administered)  HYDROmorphone (DILAUDID) injection 0.3 mg (not administered)  LORazepam (ATIVAN) injection 0.5 mg (not administered)  lactated ringers bolus 2,000 mL (0 mLs Intravenous Stopped 02/28/16 1840)  aspirin chewable tablet 324 mg (324 mg Oral Given by EMS 02/28/16 1625)  insulin aspart (novoLOG) injection 10 Units (10 Units Intravenous Given 02/28/16 1708)  acetaminophen (TYLENOL) tablet 1,000 mg (1,000 mg Oral Given 02/28/16 1849)     Initial Impression / Assessment and Plan / ED Course  I have reviewed the triage vital signs and the nursing notes.  Pertinent labs & imaging results that were available during my care of the patient were reviewed by me and considered in my medical decision making (see chart for details).  Clinical Course  Comment By Time  Vbg does not clinically correlate, I am unsure of the accuracy of the sample.  Will repeat post fluids Melene Plan, DO 08/03 1655    55 yo M  With multiple complaints. Patient complaining of sharp chest pain that is all over his chest. EKG with multiple machine readings is acute ST elevation MI. I discussed this with the code STEMI  physician, Dr. Katrinka Blazing. He has not seen an acute injury pattern at this time. I will obtain delta troponin chest x-ray CBC i-STAT Chem-8 give 2 L of fluids.  Repeat ecg.    Patient found to have a bicarbonate 8. Started on insulin drip started on a lactated Ringer's infusion with potassium. Admit to stepdown.  CRITICAL CARE Performed by: Rae Roam   Total critical care time: 35 minutes  Critical care time was exclusive of separately billable procedures and treating other patients.  Critical care was necessary to treat or prevent imminent or life-threatening deterioration.  Critical care was time spent personally by me on the following activities: development of treatment plan with patient and/or surrogate as well as nursing, discussions with consultants, evaluation of patient's response to treatment, examination of patient, obtaining history from patient or surrogate, ordering and performing treatments and interventions, ordering and review of laboratory studies, ordering and review of radiographic studies, pulse oximetry and re-evaluation of patient's condition.   Final Clinical Impressions(s) / ED Diagnoses   Final diagnoses:  Diabetic ketoacidosis without coma associated with type 2 diabetes mellitus [facility])    New Prescriptions New Prescriptions   No medications on file     Melene Plan, DO 02/28/16 1859

## 2016-02-28 NOTE — ED Notes (Signed)
Pt yelling for water. Pt given water and warm blanket.

## 2016-02-28 NOTE — H&P (Signed)
History and Physical    [name] ZOX:[id number] DOB: 08/26/1960 DOA: 02/28/2016  PCP: PROVIDER NOT IN SYSTEM   Patient coming from: Home  Chief Complaint: Chest pain  HPI: [name] is a 55 y.o. gentleman with a history of uncontrolled Type 2 DM (last A1c 11.8 in July), HTN, dyslipidemia, polysubstance abuse, hepatitis C, schizophrenia, bipolar disorder, and recent Behavioral Health admission for psychosis and suicidal ideation.  He presents to the ED today complaining for 10/10 substernal chest pressure, acute onset while mowing the lawn, which is routine for him.  He says that he has never had symptoms like this before.  The chest discomfort has been associated with shortness of breath, light-headedness, and diaphoresis.  He has also had nausea, vomiting, and self-limited diarrhea (one day).  No reported blood in his emesis or stool.  No LOC.  He has not found any alleviating factors.  He reports, "I am feeling suicidal because my chest pain is so bad."  He admits to active cocaine use, last use three days ago.  He adamantly denies tobacco or EtOH use.  He is somewhat restless and tends to become aggressive in mannerisms and speech patterns in the ED.  He admits that he has not taken any of his home medications in the past 2-3 days because he has been feeling poorly.  He reports that he went to [facility] to stay with a cousin after his last Behavioral Health discharge.  He reports that his medications were last filled in a local pharmacy in [place].  ED Course: Initial EKG was suggestive of ST elevation in the anterior leads.  This was reviewed with cardiology on-call per ED documentation.  There is no evidence of STEMI at this time.  Serial troponin recommended.  The patient has received ASA  x one.  Labs show markedly elevated blood sugar with pseudohyponatremia and severe acidosis.  Ketones are present in urine, urine glucose greater than 1000, but no evidence of UTI.  Remarkably, pH is  not acidotic on VBG.  He has received 2L of LR.  He received 10 units of regular insulin IV followed by regular insulin infusion per protocol.  Suicide precautions initiated in the ED.  Sitter at bedside.  The patient is requesting pain medication.  Hospitalist asked to admit.  Review of Systems: As per HPI otherwise 10 point review of systems negative.    Past Medical History:  Diagnosis Date  . Anxiety   . Bipolar affective disorder (HCC)   . Bipolar disorder (HCC) 03/11/2012  . Depression   . Diabetes mellitus   . Diabetes mellitus (HCC) 03/11/2012  . Hepatitis 06/30/2013   Type C  . Hypertension   . Schizophrenia, schizo-affective (HCC)     Past Surgical History:  Procedure Laterality Date  . FINGER SURGERY    . WISDOM TOOTH EXTRACTION       reports that he has never smoked. He has never used smokeless tobacco. He reports that he uses drugs, including Marijuana and Cocaine. He reports that he does not drink alcohol.  He is not married.  He has one adult child, but he considers his mother his next of kin/emergency contact.  Allergies  Allergen Reactions  . Wellbutrin [Bupropion] Hives and Swelling    Family History  Problem Relation Age of Onset  . Diabetes Mother   . Bipolar disorder Sister   He also reports that his mother has had an MI.   Prior to Admission medications   Medication Sig Start  Date End Date Taking? Authorizing Provider  glipiZIDE (GLUCOTROL) 10 MG tablet Take 1 tablet (10 mg total) by mouth 2 (two) times daily before a meal. For diabetes management Patient taking differently: Take 10 mg by mouth daily before breakfast. For diabetes management 02/08/16   Shari Prows, MD  insulin aspart (NOVOLOG) 100 UNIT/ML injection Inject 8 Units into the skin 3 (three) times daily with meals. 02/08/16   Shari Prows, MD  insulin detemir (LEVEMIR) 100 UNIT/ML injection Inject 0.5 mLs (50 Units total) into the skin at bedtime. Patient taking differently:  Inject 40 Units into the skin at bedtime.  02/08/16   Shari Prows, MD  metFORMIN (GLUCOPHAGE) 1000 MG tablet Take 1 tablet (1,000 mg total) by mouth 2 (two) times daily with a meal. For diabetes management 02/08/16   Shari Prows, MD  simvastatin (ZOCOR) 40 MG tablet Take 1 tablet (40 mg total) by mouth daily at 6 PM. 02/08/16   Jolanta B Pucilowska, MD  venlafaxine XR (EFFEXOR-XR) 150 MG 24 hr capsule Take 1 capsule (150 mg total) by mouth daily with breakfast. 02/08/16   Shari Prows, MD  ziprasidone (GEODON) 40 MG capsule Take 1 capsule (40 mg total) by mouth 2 (two) times daily with a meal. 02/08/16   Jolanta B Pucilowska, MD  zolpidem (AMBIEN) 5 MG tablet Take 1 tablet (5 mg total) by mouth at bedtime as needed for sleep. 02/08/16   Shari Prows, MD    Physical Exam: Vitals:   02/28/16 1733 02/28/16 1815 02/28/16 1830 02/28/16 1845  BP: 144/93 135/69 128/85 126/82  Pulse: 81 90    Resp: 14 19 16 25   Temp: 98.6 F (37 C)     TempSrc: Oral     SpO2: 98% 100%        Constitutional: NAD, nontoxic appearing, but intermittent agitation with aggressive behaviors noted Vitals:   02/28/16 1733 02/28/16 1815 02/28/16 1830 02/28/16 1845  BP: 144/93 135/69 128/85 126/82  Pulse: 81 90    Resp: 14 19 16 25   Temp: 98.6 F (37 C)     TempSrc: Oral     SpO2: 98% 100%     Eyes: PERRL, lids and conjunctivae normal ENMT: Mucous membranes are DRY. Posterior pharynx clear of any exudate or lesions. Normal dentition.  Neck: normal appearance, supple Respiratory: clear to auscultation bilaterally, no wheezing, no crackles. Normal respiratory effort. No accessory muscle use.  Cardiovascular: Normal rate, regular rhythm, faint murmur noted.  No extremity edema. 2+ pedal pulses. GI: abdomen is soft and compressible but he has guarding with palpation to the epigastric area.  No distention.  Bowel sounds are present. Musculoskeletal:  No joint deformity in upper and lower  extremities. Good ROM, no contractures. Normal muscle tone.  Skin: no rashes, warm and dry Neurologic: CN 2-12 grossly intact. Sensation intact, Strength symmetric bilaterally, 5/5  Psychiatric: Alert and oriented x 3. Agitated.  Impaired judgement/insight.    Labs on Admission: I have personally reviewed following labs and imaging studies  CBC:  Recent Labs Lab 02/28/16 1631 02/28/16 1637  WBC 8.6  --   NEUTROABS 6.8  --   HGB 13.8 13.6  HCT 38.6* 40.0  MCV 82.5  --   PLT 245  --    Basic Metabolic Panel:  Recent Labs Lab 02/28/16 1631 02/28/16 1637  NA 123* 126*  K 5.0 5.4*  CL 91* 97*  CO2 8*  --   GLUCOSE 992* >700*  BUN 18  25*  CREATININE 2.24* 1.60*  CALCIUM 9.1  --    ED troponin 0.02  CBG:  Recent Labs Lab 02/28/16 1618 02/28/16 1823  GLUCAP >600* >600*   Urine analysis:    Component Value Date/Time   COLORURINE YELLOW 02/28/2016 1643   APPEARANCEUR CLEAR 02/28/2016 1643   LABSPEC 1.036 (H) 02/28/2016 1643   PHURINE 5.0 02/28/2016 1643   GLUCOSEU >1000 (A) 02/28/2016 1643   HGBUR NEGATIVE 02/28/2016 1643   BILIRUBINUR NEGATIVE 02/28/2016 1643   KETONESUR >80 (A) 02/28/2016 1643   PROTEINUR NEGATIVE 02/28/2016 1643   UROBILINOGEN 0.2 02/03/2013 1018   NITRITE NEGATIVE 02/28/2016 1643   LEUKOCYTESUR NEGATIVE 02/28/2016 1643    Radiological Exams on Admission: Dg Chest Port 1 View  Result Date: 02/28/2016 CLINICAL DATA:  Chest pain. EXAM: PORTABLE CHEST 1 VIEW COMPARISON:  Radiographs of February 26, 2013. FINDINGS: The heart size and mediastinal contours are within normal limits. Both lungs are clear. No pneumothorax or pleural effusion is noted. The visualized skeletal structures are unremarkable. IMPRESSION: No acute cardiopulmonary abnormality seen. Electronically Signed   By: Lupita Raider, M.D.   On: 02/28/2016 16:58    EKG: Independently reviewed. NSR.  No acute STEMI.  Assessment/Plan Principal Problem:   DKA (diabetic ketoacidoses)  (HCC) Active Problems:   HTN (hypertension)   Hyperlipidemia   Cocaine use disorder, moderate, dependence (HCC)   Schizoaffective disorder, bipolar type (HCC)   Suicidal ideation   Cannabis use disorder, moderate, dependence (HCC)   Chest pain   AKI (acute kidney injury) (HCC)      DKA with history of uncontrolled Type 2 DM, medical noncompliance --Admit to stepdown unit for management per DKA protocol --Aggressive IV fluids resuscitation with NS, add dextrose when serum glucose less than 250 --IV insulin infusion per protocol; holding metformin, glyburide, and subQ insulin for now --Will check mag and phos one time, replace as needed --Potassium replacement per protocol --Does not appear to be infected at this time --NPO except sips/chips for now --A1c deferred, just checked last month --Hepatic function panel, lipase, CK added to admission labs  Chest pain with cardiac risk factors, heart score 5, but he also admits to active cocaine use --Telemetry monitoring --Repeat EKG in the AM --Serial troponin --Complete echo in the AM --Consider ischemic evaluation after DKA has resolved --Baby aspirin daily, nitropaste to chest wall q6h, home statin dose for now --IV analgesics as needed  AKI secondary to volume depletion, expect improvement with IV fluid resuscitation --Will check CK to rule out rhabdomyolysis --Holding metformin --Pseudohyponatremia secondary to markedly elevated blood sugars, will improve with hydration and correction of blood sugars  Suicidal ideation --Suicide precautions --Sitter --BH consult called; patient will not be seen until the morning  History of schizophrenia, bipolar disorder --Continue home doses of Geodon, effexor --Cautious use of ativan prn for anxiety, aggressive behavior while awaiting psych evaluation  DVT prophylaxis: Lovenox Code Status: FULL Family Communication: Patient alone in the ED at time of admission. Disposition Plan: He  may need psych transfer when medically stable. Consults called: Behavioral Health Admission status: Inpatient, step down unit   TIME SPENT: 75 minutes   Jerene Bears MD [facility] Pager [phone]  If 7PM-7AM, please contact night-coverage [email or web address] Password [email or web address]  02/28/2016, 7:13 PM

## 2016-02-28 NOTE — ED Notes (Signed)
Assisted patietn with urinal and repositioning in bed.

## 2016-02-29 ENCOUNTER — Inpatient Hospital Stay (HOSPITAL_COMMUNITY): Payer: Medicare Other

## 2016-02-29 DIAGNOSIS — F1220 Cannabis dependence, uncomplicated: Secondary | ICD-10-CM

## 2016-02-29 DIAGNOSIS — R079 Chest pain, unspecified: Secondary | ICD-10-CM

## 2016-02-29 DIAGNOSIS — F250 Schizoaffective disorder, bipolar type: Secondary | ICD-10-CM

## 2016-02-29 DIAGNOSIS — R45851 Suicidal ideations: Secondary | ICD-10-CM

## 2016-02-29 DIAGNOSIS — F1420 Cocaine dependence, uncomplicated: Secondary | ICD-10-CM

## 2016-02-29 DIAGNOSIS — R45850 Homicidal ideations: Secondary | ICD-10-CM

## 2016-02-29 LAB — BASIC METABOLIC PANEL
ANION GAP: 9 (ref 5–15)
ANION GAP: 8 (ref 5–15)
ANION GAP: 7 (ref 5–15)
ANION GAP: 7 (ref 5–15)
BUN: 14 mg/dL (ref 6–20)
BUN: 12 mg/dL (ref 6–20)
BUN: 12 mg/dL (ref 6–20)
BUN: 11 mg/dL (ref 6–20)
CHLORIDE: 105 mmol/L (ref 101–111)
CO2: 24 mmol/L (ref 22–32)
CO2: 23 mmol/L (ref 22–32)
CO2: 21 mmol/L — AB (ref 22–32)
CO2: 22 mmol/L (ref 22–32)
Calcium: 9.1 mg/dL (ref 8.9–10.3)
Calcium: 9.2 mg/dL (ref 8.9–10.3)
Calcium: 9.1 mg/dL (ref 8.9–10.3)
Calcium: 9.1 mg/dL (ref 8.9–10.3)
Chloride: 106 mmol/L (ref 101–111)
Chloride: 103 mmol/L (ref 101–111)
Chloride: 105 mmol/L (ref 101–111)
Creatinine, Ser: 1.21 mg/dL (ref 0.61–1.24)
Creatinine, Ser: 1.19 mg/dL (ref 0.61–1.24)
Creatinine, Ser: 1.08 mg/dL (ref 0.61–1.24)
Creatinine, Ser: 1.09 mg/dL (ref 0.61–1.24)
GFR calc Af Amer: >60 mL/min (ref >60)
GFR calc Af Amer: >60 mL/min (ref >60)
GFR calc Af Amer: >60 mL/min (ref >60)
GFR calc non Af Amer: >60 mL/min (ref >60)
GLUCOSE: 292 mg/dL — AB (ref 65–99)
GLUCOSE: 148 mg/dL — AB (ref 65–99)
GLUCOSE: 164 mg/dL — AB (ref 65–99)
Glucose, Bld: 168 mg/dL — ABNORMAL HIGH (ref 65–99)
POTASSIUM: 3.3 mmol/L — AB (ref 3.5–5.1)
POTASSIUM: 3.9 mmol/L (ref 3.5–5.1)
POTASSIUM: 3.1 mmol/L — AB (ref 3.5–5.1)
POTASSIUM: 3.3 mmol/L — AB (ref 3.5–5.1)
SODIUM: 136 mmol/L (ref 135–145)
Sodium: 135 mmol/L (ref 135–145)
Sodium: 135 mmol/L (ref 135–145)
Sodium: 134 mmol/L — ABNORMAL LOW (ref 135–145)

## 2016-02-29 LAB — ECHOCARDIOGRAM COMPLETE
HEIGHTINCHES: 69 in
WEIGHTICAEL: 3269.86 [oz_av]

## 2016-02-29 LAB — GLUCOSE, CAPILLARY
GLUCOSE-CAPILLARY: 157 mg/dL — AB (ref 65–99)
GLUCOSE-CAPILLARY: 166 mg/dL — AB (ref 65–99)
GLUCOSE-CAPILLARY: 167 mg/dL — AB (ref 65–99)
GLUCOSE-CAPILLARY: 171 mg/dL — AB (ref 65–99)
GLUCOSE-CAPILLARY: 177 mg/dL — AB (ref 65–99)
GLUCOSE-CAPILLARY: 182 mg/dL — AB (ref 65–99)
GLUCOSE-CAPILLARY: 237 mg/dL — AB (ref 65–99)
GLUCOSE-CAPILLARY: 318 mg/dL — AB (ref 65–99)
Glucose-Capillary: 168 mg/dL — ABNORMAL HIGH (ref 65–99)
Glucose-Capillary: 156 mg/dL — ABNORMAL HIGH (ref 65–99)
Glucose-Capillary: 208 mg/dL — ABNORMAL HIGH (ref 65–99)
Glucose-Capillary: 145 mg/dL — ABNORMAL HIGH (ref 65–99)
Glucose-Capillary: 174 mg/dL — ABNORMAL HIGH (ref 65–99)
Glucose-Capillary: 234 mg/dL — ABNORMAL HIGH (ref 65–99)
Glucose-Capillary: 276 mg/dL — ABNORMAL HIGH (ref 65–99)

## 2016-02-29 LAB — TROPONIN I
Troponin I: <0.03 ng/mL (ref <0.03)
Troponin I: <0.03 ng/mL (ref <0.03)

## 2016-02-29 MED ORDER — INSULIN DETEMIR 100 UNIT/ML ~~LOC~~ SOLN
20.0000 [IU] | Freq: Two times a day (BID) | SUBCUTANEOUS | Status: DC
  Administered 2016-02-29 – 2016-03-02 (×5): 20 [IU] via SUBCUTANEOUS
  Filled 2016-02-29 (×6): qty 0.2

## 2016-02-29 MED ORDER — ENOXAPARIN SODIUM 40 MG/0.4ML ~~LOC~~ SOLN
40.0000 mg | SUBCUTANEOUS | Status: DC
  Administered 2016-02-29 – 2016-03-02 (×3): 40 mg via SUBCUTANEOUS
  Filled 2016-02-29 (×3): qty 0.4

## 2016-02-29 MED ORDER — INSULIN ASPART 100 UNIT/ML ~~LOC~~ SOLN
0.0000 [IU] | Freq: Three times a day (TID) | SUBCUTANEOUS | Status: DC
  Administered 2016-02-29: 8 [IU] via SUBCUTANEOUS
  Administered 2016-02-29 – 2016-03-01 (×2): 5 [IU] via SUBCUTANEOUS
  Administered 2016-03-01: 8 [IU] via SUBCUTANEOUS
  Administered 2016-03-01: 11 [IU] via SUBCUTANEOUS
  Administered 2016-03-02 (×2): 8 [IU] via SUBCUTANEOUS
  Administered 2016-03-02: 5 [IU] via SUBCUTANEOUS
  Administered 2016-03-03: 15 [IU] via SUBCUTANEOUS
  Administered 2016-03-03: 3 [IU] via SUBCUTANEOUS
  Administered 2016-03-03: 5 [IU] via SUBCUTANEOUS

## 2016-02-29 MED ORDER — INSULIN ASPART 100 UNIT/ML ~~LOC~~ SOLN
0.0000 [IU] | Freq: Every day | SUBCUTANEOUS | Status: DC
  Administered 2016-02-29: 4 [IU] via SUBCUTANEOUS
  Administered 2016-03-01: 3 [IU] via SUBCUTANEOUS
  Administered 2016-03-02: 2 [IU] via SUBCUTANEOUS

## 2016-02-29 NOTE — Progress Notes (Signed)
PROGRESS NOTE    [name]  ZOX:[id number] DOB: 1961-02-09 DOA: 02/28/2016 PCP: PROVIDER NOT IN SYSTEM    Brief Narrative: 55 y.o. gentleman with a history of uncontrolled Type 2 DM (last A1c 11.8 in July), HTN, dyslipidemia, polysubstance abuse, hepatitis C, schizophrenia, bipolar disorder, and recent Behavioral Health admission for psychosis and suicidal ideation.  He presents to the ED today complaining for 10/10 substernal chest pressure, acute onset while mowing the lawn, which is routine for him.  He says that he has never had symptoms like this before.  The chest discomfort has been associated with shortness of breath, light-headedness, and diaphoresis.  He has also had nausea, vomiting, and self-limited diarrhea (one day).  No reported blood in his emesis or stool.  No LOC.  He has not found any alleviating factors.  He reports, "I am feeling suicidal because my chest pain is so bad."  He admits to active cocaine use, last use three days ago.   Assessment & Plan:   Principal Problem:   DKA (diabetic ketoacidoses) (HCC) - Will discontinue insulin gtt - place on insulin SQ Levemir 20 units SQ BID  - Cover with SSI and night coverage  Active Problems:   HTN (hypertension) - stable     Hyperlipidemia - stable on simvastatin    Cocaine use disorder, moderate, dependence (HCC) - Will recommend cessation    Schizoaffective disorder, bipolar type [facility]) -Psychiatry consulted.     Suicidal ideation - Awaiting Psychiatry evaluation    Cannabis use disorder, moderate, dependence (HCC)   Chest pain - troponin WNL on last check. No chest pain reported to me by patient today.    AKI (acute kidney injury) (HCC) - resolved and S creatinine within normal limites  DVT prophylaxis: Lovenox Code Status: Full Family Communication: None at bedside Disposition Plan: Pending improvement in condition for now continue stepdown monitoring   Consultants:   None   Procedures:  None   Antimicrobials: None   Subjective: Pt has no new complaints to report today.   Objective: Vitals:   02/29/16 0500 02/29/16 0600 02/29/16 0700 02/29/16 0757  BP: 104/72 136/87 (!) 133/113 110/67  Pulse: 63 70 (!) 56 68  Resp: (!) 22  16 19   Temp:    97.6 F (36.4 C)  TempSrc:    Axillary  SpO2: 97% 99% 97% 99%  Weight:      Height:        Intake/Output Summary (Last 24 hours) at 02/29/16 0955 Last data filed at 02/29/16 0700  Gross per 24 hour  Intake          1395.38 ml  Output              400 ml  Net           995.38 ml   Filed Weights   02/28/16 2044  Weight: 92.7 kg (204 lb 5.9 oz)    Examination:  General exam: Appears calm and comfortable, in nad. Respiratory system: Equal chest rise. Respiratory effort normal. Cardiovascular system: No cyanosis, warm extremities, no JVD, Gastrointestinal system: Abdomen is nondistended, soft and nontender. No organomegaly or masses felt. Normal bowel sounds heard. Central nervous system: Alert and oriented. No focal neurological deficits. Extremities: Symmetric 5 x 5 power. Skin: No rashes, lesions or ulcers Psychiatry: Judgement and insight appear normal. Mood & affect appropriate.     Data Reviewed: I have personally reviewed following labs and imaging studies  CBC:  Recent Labs Lab 02/28/16 1631 02/28/16  1637  WBC 8.6  --   NEUTROABS 6.8  --   HGB 13.8 13.6  HCT 38.6* 40.0  MCV 82.5  --   PLT 245  --    Basic Metabolic Panel:  Recent Labs Lab 02/28/16 1631 02/28/16 1637 02/28/16 2110 02/29/16 0058 02/29/16 0456 02/29/16 0848  NA 123* 126* 130* 135 136 135  K 5.0 5.4* 4.0 3.1* 3.3* 3.3*  CL 91* 97* 98* 106 103 105  CO2 8*  --  19* 22 24 23   GLUCOSE 992* >700* 355* 148* 168* 164*  BUN 18 25* 15 14 12 12   CREATININE 2.24* 1.60* 1.60* 1.21 1.19 1.08  CALCIUM 9.1  --  9.9 9.1 9.2 9.1  MG  --   --  1.9  --   --   --   PHOS  --   --  1.3*  --   --   --    GFR: Estimated Creatinine  Clearance: 86.9 mL/min (by C-G formula based on SCr of 1.08 mg/dL). Liver Function Tests:  Recent Labs Lab 02/28/16 2034  AST 17  ALT 16*  ALKPHOS 65  BILITOT 1.4*  PROT 7.7  ALBUMIN 3.9    Recent Labs Lab 02/28/16 2034  LIPASE 22   No results for input(s): AMMONIA in the last 168 hours. Coagulation Profile: No results for input(s): INR, PROTIME in the last 168 hours. Cardiac Enzymes:  Recent Labs Lab 02/28/16 2034 02/28/16 2110 02/29/16 0058 02/29/16 0456  CKTOTAL 108  --   --   --   TROPONINI  --  <0.03 <0.03 <0.03   BNP (last 3 results) No results for input(s): PROBNP in the last 8760 hours. HbA1C: No results for input(s): HGBA1C in the last 72 hours. CBG:  Recent Labs Lab 02/29/16 0501 02/29/16 0611 02/29/16 0714 02/29/16 0816 02/29/16 0854  GLUCAP 171* 237* 208* 177* 166*   Lipid Profile: No results for input(s): CHOL, HDL, LDLCALC, TRIG, CHOLHDL, LDLDIRECT in the last 72 hours. Thyroid Function Tests: No results for input(s): TSH, T4TOTAL, FREET4, T3FREE, THYROIDAB in the last 72 hours. Anemia Panel: No results for input(s): VITAMINB12, FOLATE, FERRITIN, TIBC, IRON, RETICCTPCT in the last 72 hours. Sepsis Labs: No results for input(s): PROCALCITON, LATICACIDVEN in the last 168 hours.  Recent Results (from the past 240 hour(s))  MRSA PCR Screening     Status: None   Collection Time: 02/28/16  8:50 PM  Result Value Ref Range Status   MRSA by PCR NEGATIVE NEGATIVE Final    Comment:        The GeneXpert MRSA Assay (FDA approved for NASAL specimens only), is one component of a comprehensive MRSA colonization surveillance program. It is not intended to diagnose MRSA infection nor to guide or monitor treatment for MRSA infections.          Radiology Studies: Dg Chest Port 1 View  Result Date: 02/28/2016 CLINICAL DATA:  Chest pain. EXAM: PORTABLE CHEST 1 VIEW COMPARISON:  Radiographs of February 26, 2013. FINDINGS: The heart size and  mediastinal contours are within normal limits. Both lungs are clear. No pneumothorax or pleural effusion is noted. The visualized skeletal structures are unremarkable. IMPRESSION: No acute cardiopulmonary abnormality seen. Electronically Signed   By: Lupita Raider, M.D.   On: 02/28/2016 16:58        Scheduled Meds: . enoxaparin (LOVENOX) injection  30 mg Subcutaneous Q24H  . insulin aspart  0-15 Units Subcutaneous TID WC  . insulin aspart  0-5 Units Subcutaneous QHS  .  insulin detemir  20 Units Subcutaneous BID  . nitroGLYCERIN  0.5 inch Topical Q6H  . simvastatin  40 mg Oral q1800  . venlafaxine XR  150 mg Oral Q breakfast  . ziprasidone  40 mg Oral BID WC   Continuous Infusions: . sodium chloride Stopped (02/29/16 0015)  . dextrose 5 % and 0.45% NaCl 125 mL/hr at 02/29/16 0015     LOS: 1 day    Time spent: > 35 minutes    Penny Pia, MD [facility] Pager [phone]  If 7PM-7AM, please contact night-coverage [email or web address] Password [email or web address] 02/29/2016, 9:55 AM

## 2016-02-29 NOTE — Consult Note (Signed)
[facility] Face-to-Face Psychiatry Consult   Reason for Consult:  schizoaffective disorder, cocaine and cannabis abuse Referring Physician:  Dr. Wendee Beavers Patient Identification: [name] MRN:  [id number] Principal Diagnosis: DKA (diabetic ketoacidoses) [facility]) Diagnosis:   Patient Active Problem List   Diagnosis Date Noted  . DKA (diabetic ketoacidoses) ([place]) [E13.10] 02/28/2016  . Chest pain [R07.9] 02/28/2016  . AKI (acute kidney injury) ([place]) [N17.9] 02/28/2016  . Suicidal ideation [R45.851] 02/06/2016  . Cannabis use disorder, moderate, dependence ([place]) [F12.20] 02/06/2016  . Schizoaffective disorder, bipolar type ([place]) [F25.0] 02/05/2016  . Cocaine use disorder, moderate, dependence ([place]) [F14.20] 08/03/2015  . Hyperlipidemia [E78.5] 01/18/2015  . Hyperprolactinemia ([place]) [E22.1] 01/18/2015  . Diabetes mellitus ([place]) [E11.9] 03/11/2012  . HTN (hypertension) [I10] 03/11/2012    Total Time spent with patient: 1 hour  Subjective:   [name] is a 55 y.o. male patient admitted with Chest pain, depression and suicidal ideation.  HPI:  [name] is a 55 y.o.  male with a history of uncontrolled Type 2 DM (last A1c 11.8 in July), HTN, dyslipidemia, polysubstance abuse, hepatitis C, schizophrenia, bipolar disorder, and recent [place] admission for psychosis and suicidal ideation. Patient endorses recent noncompliant with his medication for mental illness and also relapsed on Prograf and abuse which is cocaine and marijuana,and then presented to the [facility] with increased chest pain, depression and suicidal ideation patient also reported he was angry and wanted to kill the person who killed his brother. Patient reported he has been depressed for the last few weeks because his brother was killed with a gun shot in [place], [place]. patient stated his been staying with his cousin in [place] recently admitted to hospital in [place] for  patient endorses psychosis and  suicidal ideation. Patient stated he has been on disability for schizoaffective disorder, bipolar disorder and he minimizes his drug of abuse saying he does not abuse frequently. Patient stated he is abusing because of being depressed secondary to loss of his brother.  He reports, "I am feeling suicidal because my chest pain is so bad."  He admits to active cocaine use, last use three days ago. He adamantly denies tobacco or EtOH use.  He is somewhat restless and tends to become aggressive in mannerisms and has pressured speech and frequently and easily gets angry. He reports that he went to [facility] to stay with a cousin after his last [place] discharge from [facility].   patient continued to report current active suicidal ideation with the plan of shooting himself with a gun which he does not have access and also history of self-injurious behaviors and also intentional drug overdose. Patient also have homicidal thoughts towards his brother's Killer. Patient seeking inpatient psychiatric hospitalization and appropriate medication management. Patient urine drug screen is positive for cocaine and tetrahydrocannabinol.   Past Psychiatric History: Patient has been suffering with chronic schizoaffective disorder, bipolar disorder polysubstance abuse especially cocaine and marijuana. Patient has multiple acute psychiatric hospitalization at [facility], [place] and [facility]. Patient reportedly has one previous admission at discharge for suicidal attempt. Patient has been treated psychiatric outpatient at [facility] recovery center in [place].   Risk to Self: Is patient at risk for suicide?: Yes Risk to Others:   Prior Inpatient Therapy:   Prior Outpatient Therapy:    Past Medical History:  Past Medical History:  Diagnosis Date  . Anxiety   . Bipolar affective disorder ([place])   . Bipolar disorder ([place])  03/11/2012  .  Depression   . Diabetes mellitus   . Diabetes mellitus ([place]) 03/11/2012  . Hepatitis 06/30/2013   Type C  . Hypertension   . Schizophrenia, schizo-affective ([place])     Past Surgical History:  Procedure Laterality Date  . FINGER SURGERY    . WISDOM TOOTH EXTRACTION     Family History:  Family History  Problem Relation Age of Onset  . Diabetes Mother   . Bipolar disorder Sister    Family Psychiatric  History: Significant for drug of abuse and gambling in his brother. Social History:  History  Alcohol Use No     History  Drug Use  . Types: Marijuana, Cocaine    Comment: 4 days ago last THC and cocoaine use... relapsed after 2 yrs     Social History   Social History  . Marital status: Single    Spouse name: N/A  . Number of children: N/A  . Years of education: N/A   Social History Main Topics  . Smoking status: Never Smoker  . Smokeless tobacco: Never Used  . Alcohol use No  . Drug use:     Types: Marijuana, Cocaine     Comment: 4 days ago last THC and cocoaine use... relapsed after 2 yrs   . Sexual activity: Yes    Birth control/ protection: Condom     Comment: marijuana 25 days ago   Other Topics Concern  . None   Social History Narrative  . None   Additional Social History:    Allergies:   Allergies  Allergen Reactions  . Wellbutrin [Bupropion] Hives and Swelling    Labs:  Results for orders placed or performed during the hospital encounter of 02/28/16 (from the past 48 hour(s))  CBG monitoring, ED     Status: Abnormal   Collection Time: 02/28/16  4:18 PM  Result Value Ref Range   Glucose-Capillary >600 (HH) 65 - 99 mg/dL  CBC with Differential     Status: Abnormal   Collection Time: 02/28/16  4:31 PM  Result Value Ref Range   WBC 8.6 4.0 - 10.5 K/uL   RBC 4.68 4.22 - 5.81 MIL/uL   Hemoglobin 13.8 13.0 - 17.0 g/dL   HCT 38.6 (L) 39.0 - 52.0 %   MCV 82.5 78.0 - 100.0 fL   MCH 29.5 26.0 - 34.0 pg   MCHC 35.8 30.0 - 36.0 g/dL    Comment:  CORRECTED FOR INTERFERING SUBSTANCE   RDW NOT CALCULATED 11.5 - 15.5 %   Platelets 245 150 - 400 K/uL   Neutrophils Relative % 86 %   Neutro Abs 6.8 1.7 - 7.7 K/uL   Lymphocytes Relative 9 %   Lymphs Abs 0.7 0.7 - 4.0 K/uL   Monocytes Relative 5 %   Monocytes Absolute 0.4 0.1 - 1.0 K/uL   Eosinophils Relative 0 %   Eosinophils Absolute 0.0 0.0 - 0.7 K/uL   Basophils Relative 0 %   Basophils Absolute 0.0 0.0 - 0.1 K/uL  Basic metabolic panel     Status: Abnormal   Collection Time: 02/28/16  4:31 PM  Result Value Ref Range   Sodium 123 (L) 135 - 145 mmol/L   Potassium 5.0 3.5 - 5.1 mmol/L   Chloride 91 (L) 101 - 111 mmol/L   CO2 8 (L) 22 - 32 mmol/L   Glucose, Bld 992 (HH) 65 - 99 mg/dL    Comment: CRITICAL RESULT CALLED TO, READ BACK BY AND VERIFIED WITH: Joanell Rising RN  [phone] GREEN R    BUN 18 6 - 20 mg/dL   Creatinine, Ser 2.24 (H) 0.61 - 1.24 mg/dL   Calcium 9.1 8.9 - 10.3 mg/dL   GFR calc non Af Amer 31 (L) >60 mL/min   GFR calc Af Amer 36 (L) >60 mL/min    Comment: (NOTE) The eGFR has been calculated using the CKD EPI equation. This calculation has not been validated in all clinical situations. eGFR's persistently <60 mL/min signify possible Chronic Kidney Disease.    Anion gap 24 (H) 5 - 15  I-stat troponin, ED     Status: None   Collection Time: 02/28/16  4:36 PM  Result Value Ref Range   Troponin i, poc 0.02 0.00 - 0.08 ng/mL   Comment 3            Comment: Due to the release kinetics of cTnI, a negative result within the first hours of the onset of symptoms does not rule out myocardial infarction with certainty. If myocardial infarction is still suspected, repeat the test at appropriate intervals.   I-Stat Chem 8, ED     Status: Abnormal   Collection Time: 02/28/16  4:37 PM  Result Value Ref Range   Sodium 126 (L) 135 - 145 mmol/L   Potassium 5.4 (H) 3.5 - 5.1 mmol/L   Chloride 97 (L) 101 - 111 mmol/L   BUN 25 (H) 6 - 20 mg/dL   Creatinine, Ser 1.60 (H)  0.61 - 1.24 mg/dL   Glucose, Bld >700 (HH) 65 - 99 mg/dL   Calcium, Ion 1.14 1.13 - 1.30 mmol/L   TCO2 12 0 - 100 mmol/L   Hemoglobin 13.6 13.0 - 17.0 g/dL   HCT 40.0 39.0 - 52.0 %   Comment NOTIFIED PHYSICIAN   Urinalysis, Routine w reflex microscopic (not at [facility])     Status: Abnormal   Collection Time: 02/28/16  4:43 PM  Result Value Ref Range   Color, Urine YELLOW YELLOW   APPearance CLEAR CLEAR   Specific Gravity, Urine 1.036 (H) 1.005 - 1.030   pH 5.0 5.0 - 8.0   Glucose, UA >1000 (A) NEGATIVE mg/dL   Hgb urine dipstick NEGATIVE NEGATIVE   Bilirubin Urine NEGATIVE NEGATIVE   Ketones, ur >80 (A) NEGATIVE mg/dL   Protein, ur NEGATIVE NEGATIVE mg/dL   Nitrite NEGATIVE NEGATIVE   Leukocytes, UA NEGATIVE NEGATIVE  Rapid urine drug screen (hospital performed)     Status: Abnormal   Collection Time: 02/28/16  4:43 PM  Result Value Ref Range   Opiates NONE DETECTED NONE DETECTED   Cocaine POSITIVE (A) NONE DETECTED   Benzodiazepines NONE DETECTED NONE DETECTED   Amphetamines NONE DETECTED NONE DETECTED   Tetrahydrocannabinol POSITIVE (A) NONE DETECTED   Barbiturates NONE DETECTED NONE DETECTED    Comment:        DRUG SCREEN FOR MEDICAL PURPOSES ONLY.  IF CONFIRMATION IS NEEDED FOR ANY PURPOSE, NOTIFY LAB WITHIN 5 DAYS.        LOWEST DETECTABLE LIMITS FOR URINE DRUG SCREEN Drug Class       Cutoff (ng/mL) Amphetamine      1000 Barbiturate      200 Benzodiazepine   962 Tricyclics       836 Opiates          300 Cocaine          300 THC              50   Urine microscopic-add on  Status: Abnormal   Collection Time: 02/28/16  4:43 PM  Result Value Ref Range   Squamous Epithelial / LPF 0-5 (A) NONE SEEN   WBC, UA 0-5 0 - 5 WBC/hpf   RBC / HPF NONE SEEN 0 - 5 RBC/hpf   Bacteria, UA NONE SEEN NONE SEEN  I-Stat venous blood gas, ED     Status: Abnormal   Collection Time: 02/28/16  4:46 PM  Result Value Ref Range   pH, Ven 7.345 (H) 7.250 - 7.300   pCO2, Ven 18.3 (L)  45.0 - 50.0 mmHg   pO2, Ven 166.0 (H) 31.0 - 45.0 mmHg   Bicarbonate 10.0 (L) 20.0 - 24.0 mEq/L   TCO2 11 0 - 100 mmol/L   O2 Saturation 99.0 %   Acid-base deficit 13.0 (H) 0.0 - 2.0 mmol/L   Patient temperature HIDE    Sample type VENOUS    Comment NOTIFIED PHYSICIAN   CBG monitoring, ED     Status: Abnormal   Collection Time: 02/28/16  6:23 PM  Result Value Ref Range   Glucose-Capillary >600 (HH) 65 - 99 mg/dL  CBG monitoring, ED     Status: Abnormal   Collection Time: 02/28/16  7:36 PM  Result Value Ref Range   Glucose-Capillary 570 (HH) 65 - 99 mg/dL   Comment 1 Notify RN    Comment 2 Document in Chart   Hepatic function panel     Status: Abnormal   Collection Time: 02/28/16  8:34 PM  Result Value Ref Range   Total Protein 7.7 6.5 - 8.1 g/dL   Albumin 3.9 3.5 - 5.0 g/dL   AST 17 15 - 41 U/L   ALT 16 (L) 17 - 63 U/L   Alkaline Phosphatase 65 38 - 126 U/L   Total Bilirubin 1.4 (H) 0.3 - 1.2 mg/dL   Bilirubin, Direct <0.1 (L) 0.1 - 0.5 mg/dL   Indirect Bilirubin NOT CALCULATED 0.3 - 0.9 mg/dL  Lipase, blood     Status: None   Collection Time: 02/28/16  8:34 PM  Result Value Ref Range   Lipase 22 11 - 51 U/L  CK     Status: None   Collection Time: 02/28/16  8:34 PM  Result Value Ref Range   Total CK 108 49 - 397 U/L  MRSA PCR Screening     Status: None   Collection Time: 02/28/16  8:50 PM  Result Value Ref Range   MRSA by PCR NEGATIVE NEGATIVE    Comment:        The GeneXpert MRSA Assay (FDA approved for NASAL specimens only), is one component of a comprehensive MRSA colonization surveillance program. It is not intended to diagnose MRSA infection nor to guide or monitor treatment for MRSA infections.   Glucose, capillary     Status: Abnormal   Collection Time: 02/28/16  8:53 PM  Result Value Ref Range   Glucose-Capillary 433 (H) 65 - 99 mg/dL  Basic metabolic panel     Status: Abnormal   Collection Time: 02/28/16  9:10 PM  Result Value Ref Range   Sodium  130 (L) 135 - 145 mmol/L   Potassium 4.0 3.5 - 5.1 mmol/L    Comment: DELTA CHECK NOTED   Chloride 98 (L) 101 - 111 mmol/L   CO2 19 (L) 22 - 32 mmol/L   Glucose, Bld 355 (H) 65 - 99 mg/dL   BUN 15 6 - 20 mg/dL   Creatinine, Ser 1.60 (H) 0.61 - 1.24 mg/dL  Calcium 9.9 8.9 - 10.3 mg/dL   GFR calc non Af Amer 47 (L) >60 mL/min   GFR calc Af Amer 54 (L) >60 mL/min    Comment: (NOTE) The eGFR has been calculated using the CKD EPI equation. This calculation has not been validated in all clinical situations. eGFR's persistently <60 mL/min signify possible Chronic Kidney Disease.    Anion gap 13 5 - 15  Troponin I-serum (0, 3, 6 hours)     Status: None   Collection Time: 02/28/16  9:10 PM  Result Value Ref Range   Troponin I <0.03 <0.03 ng/mL  Magnesium     Status: None   Collection Time: 02/28/16  9:10 PM  Result Value Ref Range   Magnesium 1.9 1.7 - 2.4 mg/dL  Phosphorus     Status: Abnormal   Collection Time: 02/28/16  9:10 PM  Result Value Ref Range   Phosphorus 1.3 (L) 2.5 - 4.6 mg/dL  Beta-hydroxybutyric acid     Status: Abnormal   Collection Time: 02/28/16  9:10 PM  Result Value Ref Range   Beta-Hydroxybutyric Acid 2.99 (H) 0.05 - 0.27 mmol/L  Glucose, capillary     Status: Abnormal   Collection Time: 02/28/16  9:59 PM  Result Value Ref Range   Glucose-Capillary 368 (H) 65 - 99 mg/dL   Comment 1 Notify RN   Glucose, capillary     Status: Abnormal   Collection Time: 02/28/16 11:04 PM  Result Value Ref Range   Glucose-Capillary 512 (HH) 65 - 99 mg/dL   Comment 1 Notify RN   Glucose, capillary     Status: Abnormal   Collection Time: 02/29/16 12:11 AM  Result Value Ref Range   Glucose-Capillary 182 (H) 65 - 99 mg/dL   Comment 1 Notify RN   Basic metabolic panel     Status: Abnormal   Collection Time: 02/29/16 12:58 AM  Result Value Ref Range   Sodium 135 135 - 145 mmol/L   Potassium 3.1 (L) 3.5 - 5.1 mmol/L    Comment: DELTA CHECK NOTED   Chloride 106 101 - 111  mmol/L   CO2 22 22 - 32 mmol/L   Glucose, Bld 148 (H) 65 - 99 mg/dL   BUN 14 6 - 20 mg/dL   Creatinine, Ser 1.21 0.61 - 1.24 mg/dL   Calcium 9.1 8.9 - 10.3 mg/dL   GFR calc non Af Amer >60 >60 mL/min   GFR calc Af Amer >60 >60 mL/min    Comment: (NOTE) The eGFR has been calculated using the CKD EPI equation. This calculation has not been validated in all clinical situations. eGFR's persistently <60 mL/min signify possible Chronic Kidney Disease.    Anion gap 7 5 - 15  Troponin I-serum (0, 3, 6 hours)     Status: None   Collection Time: 02/29/16 12:58 AM  Result Value Ref Range   Troponin I <0.03 <0.03 ng/mL  Glucose, capillary     Status: Abnormal   Collection Time: 02/29/16  1:14 AM  Result Value Ref Range   Glucose-Capillary 157 (H) 65 - 99 mg/dL   Comment 1 Notify RN   Glucose, capillary     Status: Abnormal   Collection Time: 02/29/16  2:11 AM  Result Value Ref Range   Glucose-Capillary 168 (H) 65 - 99 mg/dL   Comment 1 Notify RN   Glucose, capillary     Status: Abnormal   Collection Time: 02/29/16  3:17 AM  Result Value Ref Range   Glucose-Capillary 167 (  H) 65 - 99 mg/dL   Comment 1 Notify RN   Glucose, capillary     Status: Abnormal   Collection Time: 02/29/16  4:15 AM  Result Value Ref Range   Glucose-Capillary 156 (H) 65 - 99 mg/dL   Comment 1 Notify RN   Troponin I-serum (0, 3, 6 hours)     Status: None   Collection Time: 02/29/16  4:56 AM  Result Value Ref Range   Troponin I <0.03 <0.03 ng/mL  Basic metabolic panel     Status: Abnormal   Collection Time: 02/29/16  4:56 AM  Result Value Ref Range   Sodium 136 135 - 145 mmol/L   Potassium 3.3 (L) 3.5 - 5.1 mmol/L   Chloride 103 101 - 111 mmol/L   CO2 24 22 - 32 mmol/L   Glucose, Bld 168 (H) 65 - 99 mg/dL   BUN 12 6 - 20 mg/dL   Creatinine, Ser 1.19 0.61 - 1.24 mg/dL   Calcium 9.2 8.9 - 10.3 mg/dL   GFR calc non Af Amer >60 >60 mL/min   GFR calc Af Amer >60 >60 mL/min    Comment: (NOTE) The eGFR has  been calculated using the CKD EPI equation. This calculation has not been validated in all clinical situations. eGFR's persistently <60 mL/min signify possible Chronic Kidney Disease.    Anion gap 9 5 - 15  Glucose, capillary     Status: Abnormal   Collection Time: 02/29/16  5:01 AM  Result Value Ref Range   Glucose-Capillary 171 (H) 65 - 99 mg/dL  Glucose, capillary     Status: Abnormal   Collection Time: 02/29/16  6:11 AM  Result Value Ref Range   Glucose-Capillary 237 (H) 65 - 99 mg/dL   Comment 1 Notify RN   Glucose, capillary     Status: Abnormal   Collection Time: 02/29/16  7:14 AM  Result Value Ref Range   Glucose-Capillary 208 (H) 65 - 99 mg/dL   Comment 1 Notify RN   Glucose, capillary     Status: Abnormal   Collection Time: 02/29/16  8:16 AM  Result Value Ref Range   Glucose-Capillary 177 (H) 65 - 99 mg/dL  Basic metabolic panel     Status: Abnormal   Collection Time: 02/29/16  8:48 AM  Result Value Ref Range   Sodium 135 135 - 145 mmol/L   Potassium 3.3 (L) 3.5 - 5.1 mmol/L   Chloride 105 101 - 111 mmol/L   CO2 23 22 - 32 mmol/L   Glucose, Bld 164 (H) 65 - 99 mg/dL   BUN 12 6 - 20 mg/dL   Creatinine, Ser 1.08 0.61 - 1.24 mg/dL   Calcium 9.1 8.9 - 10.3 mg/dL   GFR calc non Af Amer >60 >60 mL/min   GFR calc Af Amer >60 >60 mL/min    Comment: (NOTE) The eGFR has been calculated using the CKD EPI equation. This calculation has not been validated in all clinical situations. eGFR's persistently <60 mL/min signify possible Chronic Kidney Disease.    Anion gap 7 5 - 15  Glucose, capillary     Status: Abnormal   Collection Time: 02/29/16  8:54 AM  Result Value Ref Range   Glucose-Capillary 166 (H) 65 - 99 mg/dL  Glucose, capillary     Status: Abnormal   Collection Time: 02/29/16 10:09 AM  Result Value Ref Range   Glucose-Capillary 145 (H) 65 - 99 mg/dL  Glucose, capillary     Status: Abnormal  Collection Time: 02/29/16 11:14 AM  Result Value Ref Range    Glucose-Capillary 174 (H) 65 - 99 mg/dL  Glucose, capillary     Status: Abnormal   Collection Time: 02/29/16 12:06 PM  Result Value Ref Range   Glucose-Capillary 234 (H) 65 - 99 mg/dL    Current Facility-Administered Medications  Medication Dose Route Frequency Provider Last Rate Last Dose  . 0.9 %  sodium chloride infusion   Intravenous Continuous Lily Kocher, MD   Stopped at 02/29/16 0015  . acetaminophen (TYLENOL) tablet 650 mg  650 mg Oral Q4H PRN Lily Kocher, MD      . dextrose 5 %-0.45 % sodium chloride infusion   Intravenous Continuous Lily Kocher, MD 125 mL/hr at 02/29/16 0015    . enoxaparin (LOVENOX) injection 30 mg  30 mg Subcutaneous Q24H Lily Kocher, MD   30 mg at 02/28/16 2121  . insulin aspart (novoLOG) injection 0-15 Units  0-15 Units Subcutaneous TID WC Velvet Bathe, MD      . insulin aspart (novoLOG) injection 0-5 Units  0-5 Units Subcutaneous QHS Velvet Bathe, MD      . insulin detemir (LEVEMIR) injection 20 Units  20 Units Subcutaneous BID Velvet Bathe, MD   20 Units at 02/29/16 1054  . nitroGLYCERIN (NITROGLYN) 2 % ointment 0.5 inch  0.5 inch Topical Q6H Lily Kocher, MD   0.5 inch at 02/29/16 [phone]  . nitroGLYCERIN (NITROSTAT) SL tablet 0.4 mg  0.4 mg Sublingual Q5 min PRN Deno Etienne, DO      . ondansetron [facility]) injection 4 mg  4 mg Intravenous Q6H PRN Lily Kocher, MD      . simvastatin (ZOCOR) tablet 40 mg  40 mg Oral q1800 Lily Kocher, MD      . venlafaxine XR (EFFEXOR-XR) 24 hr capsule 150 mg  150 mg Oral Q breakfast Lily Kocher, MD      . ziprasidone (GEODON) capsule 40 mg  40 mg Oral BID WC Lily Kocher, MD      . zolpidem Lorrin Mais) tablet 5 mg  5 mg Oral QHS PRN Lily Kocher, MD   5 mg at 02/28/16 2133    Musculoskeletal: Strength & Muscle Tone: within normal limits Gait & Station: unable to stand Patient leans: N/A  Psychiatric Specialty Exam: Physical Exam as per history and physical   ROS depression, irritability, anger outbursts, substance abuse,  suicidal and homicidal thoughts. Patient also has mild shortness of breath and chest pain secondary to drug of abuse. No Fever-chills, No Headache, No changes with Vision or hearing, reports vertigo No problems swallowing food or Liquids, No Chest pain, Cough or Shortness of Breath, No Abdominal pain, No Nausea or Vommitting, Bowel movements are regular, No Blood in stool or Urine, No dysuria, No new skin rashes or bruises, No new joints pains-aches,  No new weakness, tingling, numbness in any extremity, No recent weight gain or loss, No polyuria, polydypsia or polyphagia,   A full 10 point Review of Systems was done, except as stated above, all other Review of Systems were negative.  Blood pressure 107/64, pulse 70, temperature 97.5 F (36.4 C), temperature source Axillary, resp. rate 20, height _0  (1.753 m), weight 92.7 kg (204 lb 5.9 oz), SpO2 100 %.Body mass index is 30.18 kg/m.  General Appearance: Bizarre and Guarded  Eye Contact:  Good  Speech:  Pressured  Volume:  Increased  Mood:  Depressed and Irritable  Affect:  Labile  Thought Process:  Coherent and Goal Directed  Orientation:  Full (Time, Place, and Person)  Thought Content:  Hallucinations: Auditory, Paranoid Ideation and Rumination  Suicidal Thoughts:  Yes.  with intent/plan  Homicidal Thoughts:  Yes.  without intent/plan  Memory:  Immediate;   Fair Recent;   Fair  Judgement:  Impaired  Insight:  Fair  Psychomotor Activity:  Restlessness  Concentration:  Concentration: Fair and Attention Span: Fair  Recall:  [employer] of Knowledge:  Good  Language:  Good  Akathisia:  Negative  Handed:  Right  AIMS (if indicated):     Assets:  Communication Skills Desire for Improvement Financial Resources/Insurance Leisure Time Resilience Social Support  ADL's:  Intact  Cognition:  WNL  Sleep:        Treatment Plan Summary: Daily contact with patient to assess and evaluate symptoms and progress in treatment  and Medication management Paranoid schizophrenia: Continue Geodon 40 mg twice daily with the EKG for monitoring QTC prolongation Depression: Continue Effexor XR 150 mg daily Substance abuse: Counseled and informed stay sober Safety concerns: Patient cannot contract for safety and continued to endorse suicidal and homicidal ideation Referred to the psychiatric social service to contact appropriate Sherif department regarding homicidal ideations intentions or plans as a need of Duty to protect. Patient may need involuntary commitment when try to leave the hospital.  Disposition: Recommend psychiatric Inpatient admission when medically cleared. Supportive therapy provided about ongoing stressors.  Ambrose Finland, MD 02/29/2016 12:24 PM

## 2016-02-29 NOTE — Progress Notes (Signed)
Inpatient Diabetes Program Recommendations  AACE/ADA: New Consensus Statement on Inpatient Glycemic Control (2015)  Target Ranges:  Prepandial:   less than 140 mg/dL      Peak postprandial:   less than 180 mg/dL (1-2 hours)      Critically ill patients:  140 - 180 mg/dL   Results for [name], [name] (MRN [id number]) as of 02/29/2016 07:26  Ref. Range 02/28/2016 16:31  Sodium Latest Ref Range: 135 - 145 mmol/L 123 (L)  Potassium Latest Ref Range: 3.5 - 5.1 mmol/L 5.0  Chloride Latest Ref Range: 101 - 111 mmol/L 91 (L)  CO2 Latest Ref Range: 22 - 32 mmol/L 8 (L)  BUN Latest Ref Range: 6 - 20 mg/dL 18  Creatinine Latest Ref Range: 0.61 - 1.24 mg/dL 2.24 (H)  Calcium Latest Ref Range: 8.9 - 10.3 mg/dL 9.1  EGFR (Non-African Amer.) Latest Ref Range: >60 mL/min 31 (L)  EGFR (African American) Latest Ref Range: >60 mL/min 36 (L)  Glucose Latest Ref Range: 65 - 99 mg/dL 992 (HH)  Anion gap Latest Ref Range: 5 - 15  24 (H)    Admit with: CP/ DKA  History: DM, Polysubstance Abuse, Schizophrenia, Bipolar disorder  Home DM Meds: Levemir 40 units QHS       Novolog 8 units tidwc       Metformin 1000 mg bid       Glipizide 10 mg daily  Current Insulin Orders: IV Insulin drip (started yesterday at 6pm)     MD- Note CO2 up to 24 and Anion Gap 9 this AM.  When you transition patient off the IV Insulin drip, please consider the following:  1. Start Levemir 40 units daily (give 1st dose and continue IV Insulin drip 1-2 hours then can d/c IV Insulin drip)  2. Start Novolog Moderate Correction Scale/ SSI (0-15 units) TID AC + HS      --Will follow patient during hospitalization--  Wyn Quaker RN, MSN, CDE Diabetes Coordinator Inpatient Glycemic Control Team Team Pager: [phone] (8a-5p)

## 2016-02-29 NOTE — Progress Notes (Signed)
Placed patient on CPAP without complications. RT will continue to monitor as needed.

## 2016-02-29 NOTE — Progress Notes (Signed)
Echocardiogram 2D Echocardiogram has been performed.  [name] 02/29/2016, 2:54 PM

## 2016-03-01 LAB — GLUCOSE, CAPILLARY
GLUCOSE-CAPILLARY: 323 mg/dL — AB (ref 65–99)
GLUCOSE-CAPILLARY: 277 mg/dL — AB (ref 65–99)
Glucose-Capillary: 223 mg/dL — ABNORMAL HIGH (ref 65–99)
Glucose-Capillary: 257 mg/dL — ABNORMAL HIGH (ref 65–99)

## 2016-03-01 NOTE — Progress Notes (Signed)
PROGRESS NOTE    [name]  ZOX:[id number] DOB: February 04, 1961 DOA: 02/28/2016 PCP: PROVIDER NOT IN SYSTEM    Brief Narrative: 55 y.o. gentleman with a history of uncontrolled Type 2 DM (last A1c 11.8 in July), HTN, dyslipidemia, polysubstance abuse, hepatitis C, schizophrenia, bipolar disorder, and recent Behavioral Health admission for psychosis and suicidal ideation.  He presents to the ED today complaining for 10/10 substernal chest pressure, acute onset while mowing the lawn, which is routine for him.  He says that he has never had symptoms like this before.  The chest discomfort has been associated with shortness of breath, light-headedness, and diaphoresis.  He has also had nausea, vomiting, and self-limited diarrhea (one day).  No reported blood in his emesis or stool.  No LOC.  He has not found any alleviating factors.  He reports, "I am feeling suicidal because my chest pain is so bad."  He admits to active cocaine use, last use three days ago.   Assessment & Plan:   Principal Problem:   DKA (diabetic ketoacidoses) (HCC) - After resolution discontinued insulin gtt - placed on insulin SQ Levemir 20 units SQ BID  - Continue SSI and night coverage  Active Problems:   HTN (hypertension) - stable     Hyperlipidemia - stable on simvastatin    Cocaine use disorder, moderate, dependence (HCC) - Will recommend cessation    Schizoaffective disorder, bipolar type [facility]) -Psychiatry consulted.     Suicidal ideation - Awaiting Psychiatry evaluation    Cannabis use disorder, moderate, dependence (HCC)   Chest pain - troponin WNL on last check. No chest pain reported to me by patient today.    AKI (acute kidney injury) (HCC) - resolved and S creatinine within normal limites  DVT prophylaxis: Lovenox Code Status: Full Family Communication: None at bedside Disposition Plan: Transition to floor   Consultants:   None   Procedures: None   Antimicrobials:  None   Subjective: The patient states he still feels suicidal today  Objective: Vitals:   03/01/16 0600 03/01/16 0819 03/01/16 1200 03/01/16 1340  BP: 107/72 132/83 118/80 (!) 102/53  Pulse: 62 (!) 56  65  Resp: 19 20 20 20   Temp:  97.6 F (36.4 C) 97.6 F (36.4 C) 97.4 F (36.3 C)  TempSrc:  Oral Oral Oral  SpO2: 93% 95% 100% 99%  Weight:      Height:        Intake/Output Summary (Last 24 hours) at 03/01/16 1517 Last data filed at 03/01/16 0900  Gross per 24 hour  Intake             1740 ml  Output             1950 ml  Net             -210 ml   Filed Weights   02/28/16 2044  Weight: 92.7 kg (204 lb 5.9 oz)    Examination:  General exam: Appears calm and comfortable, in nad. Respiratory system: Equal chest rise. Respiratory effort normal. Cardiovascular system: No cyanosis, warm extremities, no JVD, Gastrointestinal system: Abdomen is nondistended, soft and nontender. No organomegaly or masses felt. Normal bowel sounds heard. Central nervous system: Alert and oriented. No focal neurological deficits. Extremities: Symmetric 5 x 5 power. Skin: No rashes, lesions or ulcers Psychiatry: Judgement and insight appear normal. Mood & affect appropriate.     Data Reviewed: I have personally reviewed following labs and imaging studies  CBC:  Recent Labs Lab 02/28/16  1631 02/28/16 1637  WBC 8.6  --   NEUTROABS 6.8  --   HGB 13.8 13.6  HCT 38.6* 40.0  MCV 82.5  --   PLT 245  --    Basic Metabolic Panel:  Recent Labs Lab 02/28/16 2110 02/29/16 0058 02/29/16 0456 02/29/16 0848 02/29/16 1909  NA 130* 135 136 135 134*  K 4.0 3.1* 3.3* 3.3* 3.9  CL 98* 106 103 105 105  CO2 19* 21*  GLUCOSE 355* 148* 168* 164* 292*  BUN CREATININE 1.60* 1.21 1.19 1.08 1.09  CALCIUM 9.9 9.1 9.2 9.1 9.1  MG 1.9  --   --   --   --   PHOS 1.3*  --   --   --   --    GFR: Estimated Creatinine Clearance: 86.1 mL/min (by C-G formula based on SCr of 1.09  mg/dL). Liver Function Tests:  Recent Labs Lab 02/28/16 2034  AST 17  ALT 16*  ALKPHOS 65  BILITOT 1.4*  PROT 7.7  ALBUMIN 3.9    Recent Labs Lab 02/28/16 2034  LIPASE 22   No results for input(s): AMMONIA in the last 168 hours. Coagulation Profile: No results for input(s): INR, PROTIME in the last 168 hours. Cardiac Enzymes:  Recent Labs Lab 02/28/16 2034 02/28/16 2110 02/29/16 0058 02/29/16 0456  CKTOTAL 108  --   --   --   TROPONINI  --  <0.03 <0.03 <0.03   BNP (last 3 results) No results for input(s): PROBNP in the last 8760 hours. HbA1C: No results for input(s): HGBA1C in the last 72 hours. CBG:  Recent Labs Lab 02/29/16 1206 02/29/16 1637 02/29/16 2126 03/01/16 0801 03/01/16 1218  GLUCAP 234* 276* 318* 323* 223*   Lipid Profile: No results for input(s): CHOL, HDL, LDLCALC, TRIG, CHOLHDL, LDLDIRECT in the last 72 hours. Thyroid Function Tests: No results for input(s): TSH, T4TOTAL, FREET4, T3FREE, THYROIDAB in the last 72 hours. Anemia Panel: No results for input(s): VITAMINB12, FOLATE, FERRITIN, TIBC, IRON, RETICCTPCT in the last 72 hours. Sepsis Labs: No results for input(s): PROCALCITON, LATICACIDVEN in the last 168 hours.  Recent Results (from the past 240 hour(s))  MRSA PCR Screening     Status: None   Collection Time: 02/28/16  8:50 PM  Result Value Ref Range Status   MRSA by PCR NEGATIVE NEGATIVE Final    Comment:        The GeneXpert MRSA Assay (FDA approved for NASAL specimens only), is one component of a comprehensive MRSA colonization surveillance program. It is not intended to diagnose MRSA infection nor to guide or monitor treatment for MRSA infections.          Radiology Studies: Dg Chest Port 1 View  Result Date: 02/28/2016 CLINICAL DATA:  Chest pain. EXAM: PORTABLE CHEST 1 VIEW COMPARISON:  Radiographs of February 26, 2013. FINDINGS: The heart size and mediastinal contours are within normal limits. Both lungs are  clear. No pneumothorax or pleural effusion is noted. The visualized skeletal structures are unremarkable. IMPRESSION: No acute cardiopulmonary abnormality seen. Electronically Signed   By: Lupita Raider, M.D.   On: 02/28/2016 16:58        Scheduled Meds: . enoxaparin (LOVENOX) injection  40 mg Subcutaneous Q24H  . insulin aspart  0-15 Units Subcutaneous TID WC  . insulin aspart  0-5 Units Subcutaneous QHS  . insulin detemir  20 Units Subcutaneous BID  . nitroGLYCERIN  0.5 inch Topical Q6H  .  simvastatin  40 mg Oral q1800  . venlafaxine XR  150 mg Oral Q breakfast  . ziprasidone  40 mg Oral BID WC   Continuous Infusions: . sodium chloride Stopped (02/29/16 2000)  . dextrose 5 % and 0.45% NaCl Stopped (02/29/16 2101)     LOS: 2 days    Time spent: > 35 minutes    Penny Pia, MD [facility] Pager [phone]  If 7PM-7AM, please contact night-coverage [email or web address] Password [email or web address] 03/01/2016, 3:17 PM

## 2016-03-01 NOTE — Progress Notes (Signed)
LCSW aware of consult for IP psych placement. Once patient is medically stable will complete referrals as recommended by Psych MD.  Deretha Emory, MSW Clinical Social Work: [employer] [phone]

## 2016-03-01 NOTE — Progress Notes (Signed)
Inpatient Diabetes Program Recommendations  AACE/ADA: New Consensus Statement on Inpatient Glycemic Control (2015)  Target Ranges:  Prepandial:   less than 140 mg/dL      Peak postprandial:   less than 180 mg/dL (1-2 hours)      Critically ill patients:  140 - 180 mg/dL   Results for [name], [name] (MRN [id number]) as of 03/01/2016 08:48  Ref. Range 02/29/2016 11:14 02/29/2016 12:06 02/29/2016 16:37 02/29/2016 21:26 03/01/2016 08:01  Glucose-Capillary Latest Ref Range: 65 - 99 mg/dL 299 (H) 371 (H) 696 (H) 318 (H) 323 (H)    Review of Glycemic Control  Home DM Meds: Levemir 40 units QHS                                                 Novolog 8 units tidwc                                                 Metformin 1000 mg bid                                                 Glipizide 10 mg daily  Current Insulin Orders: Levemir 20 units qd + Novolog correction 0-15 tid with meals +0-5 hs   Inpatient Diabetes Program Recommendations:    Please consider increase in Levemir to 40 units qd.  Thank you, [name]. Joseline Mccampbell, RN, MSN, CDE Inpatient Glycemic Control Team Team Pager [phone] (8am-5pm) 03/01/2016 8:51 AM

## 2016-03-02 DIAGNOSIS — R0789 Other chest pain: Secondary | ICD-10-CM

## 2016-03-02 LAB — GLUCOSE, CAPILLARY
GLUCOSE-CAPILLARY: 297 mg/dL — AB (ref 65–99)
GLUCOSE-CAPILLARY: 261 mg/dL — AB (ref 65–99)
GLUCOSE-CAPILLARY: 224 mg/dL — AB (ref 65–99)
GLUCOSE-CAPILLARY: 248 mg/dL — AB (ref 65–99)
Glucose-Capillary: 283 mg/dL — ABNORMAL HIGH (ref 65–99)

## 2016-03-02 MED ORDER — IBUPROFEN 600 MG PO TABS
600.0000 mg | ORAL_TABLET | Freq: Four times a day (QID) | ORAL | Status: DC | PRN
  Administered 2016-03-02 – 2016-03-03 (×2): 600 mg via ORAL
  Filled 2016-03-02 (×2): qty 1

## 2016-03-02 MED ORDER — INSULIN DETEMIR 100 UNIT/ML ~~LOC~~ SOLN
22.0000 [IU] | Freq: Two times a day (BID) | SUBCUTANEOUS | Status: DC
  Administered 2016-03-02 – 2016-03-03 (×2): 22 [IU] via SUBCUTANEOUS
  Filled 2016-03-02 (×3): qty 0.22

## 2016-03-02 NOTE — Progress Notes (Signed)
RT went to apply patient CPAP and patient stated he didn't want to wear CPAP tonight. RT will continue to monitor.

## 2016-03-02 NOTE — Progress Notes (Signed)
PROGRESS NOTE    [name]  ZOX:[id number] DOB: 07/15/1961 DOA: 02/28/2016 PCP: PROVIDER NOT IN SYSTEM    Brief Narrative: 55 y.o. gentleman with a history of uncontrolled Type 2 DM (last A1c 11.8 in July), HTN, dyslipidemia, polysubstance abuse, hepatitis C, schizophrenia, bipolar disorder, and recent Behavioral Health admission for psychosis and suicidal ideation.  He presents to the ED today complaining for 10/10 substernal chest pressure, acute onset while mowing the lawn, which is routine for him.  He says that he has never had symptoms like this before.  The chest discomfort has been associated with shortness of breath, light-headedness, and diaphoresis.  He has also had nausea, vomiting, and self-limited diarrhea (one day).  No reported blood in his emesis or stool.  No LOC.  He has not found any alleviating factors.  He reports, "I am feeling suicidal because my chest pain is so bad."  He admits to active cocaine use, last use three days ago.   Assessment & Plan:   Principal Problem:   DKA (diabetic ketoacidoses) (HCC) - Resolved - placed on insulin SQ Levemir 20 units SQ BID; Will increase dose to 22 units BID - Continue SSI and night coverage  Active Problems:   HTN (hypertension) - stable     Hyperlipidemia - stable on simvastatin    Cocaine use disorder, moderate, dependence (HCC) - Will recommend cessation    Schizoaffective disorder, bipolar type [facility]) -Psychiatry consulted.     Suicidal ideation - Awaiting Psychiatry evaluation    Cannabis use disorder, moderate, dependence (HCC)   Chest pain - troponin WNL on last check. No chest pain reported to me by patient today.    AKI (acute kidney injury) (HCC) - resolved and S creatinine within normal limites  DVT prophylaxis: Lovenox Code Status: Full Family Communication: None at bedside Disposition Plan: Awaiting transfer to [facility]   Consultants:   None   Procedures: None   Antimicrobials:  None   Subjective: Pt continues to feel suicidal today  Objective: Vitals:   03/01/16 1340 03/01/16 2137 03/02/16 0556 03/02/16 1420  BP: (!) 102/53 121/73 111/66 128/72  Pulse: 65 (!) 55 (!) 57 62  Resp: 20 20 20 19   Temp: 97.4 F (36.3 C) 98 F (36.7 C) 97.9 F (36.6 C) 98.4 F (36.9 C)  TempSrc: Oral Oral Oral   SpO2: 99% 99% 99% 100%  Weight:      Height:        Intake/Output Summary (Last 24 hours) at 03/02/16 1706 Last data filed at 03/02/16 1357  Gross per 24 hour  Intake              960 ml  Output                0 ml  Net              960 ml   Filed Weights   02/28/16 2044  Weight: 92.7 kg (204 lb 5.9 oz)    Examination:  General exam: Appears calm and comfortable, in nad. Respiratory system: Equal chest rise. Respiratory effort normal. Cardiovascular system: No cyanosis, warm extremities, no JVD, Gastrointestinal system: Abdomen is nondistended, soft and nontender. No organomegaly or masses felt. Normal bowel sounds heard. Central nervous system: Alert and oriented. No focal neurological deficits. Extremities: Symmetric 5 x 5 power. Skin: No rashes, lesions or ulcers Psychiatry: Judgement and insight appear normal. Mood & affect appropriate.     Data Reviewed: I have personally reviewed following labs  and imaging studies  CBC:  Recent Labs Lab 02/28/16 1631 02/28/16 1637  WBC 8.6  --   NEUTROABS 6.8  --   HGB 13.8 13.6  HCT 38.6* 40.0  MCV 82.5  --   PLT 245  --    Basic Metabolic Panel:  Recent Labs Lab 02/28/16 2110 02/29/16 0058 02/29/16 0456 02/29/16 0848 02/29/16 1909  NA 130* 135 136 135 134*  K 4.0 3.1* 3.3* 3.3* 3.9  CL 98* 106 103 105 105  CO2 19* 22 24 23  21*  GLUCOSE 355* 148* 168* 164* 292*  BUN 15 14 12 12 11   CREATININE 1.60* 1.21 1.19 1.08 1.09  CALCIUM 9.9 9.1 9.2 9.1 9.1  MG 1.9  --   --   --   --   PHOS 1.3*  --   --   --   --    GFR: Estimated Creatinine Clearance: 86.1 mL/min (by C-G formula based on SCr  of 1.09 mg/dL). Liver Function Tests:  Recent Labs Lab 02/28/16 2034  AST 17  ALT 16*  ALKPHOS 65  BILITOT 1.4*  PROT 7.7  ALBUMIN 3.9    Recent Labs Lab 02/28/16 2034  LIPASE 22   No results for input(s): AMMONIA in the last 168 hours. Coagulation Profile: No results for input(s): INR, PROTIME in the last 168 hours. Cardiac Enzymes:  Recent Labs Lab 02/28/16 2034 02/28/16 2110 02/29/16 0058 02/29/16 0456  CKTOTAL 108  --   --   --   TROPONINI  --  <0.03 <0.03 <0.03   BNP (last 3 results) No results for input(s): PROBNP in the last 8760 hours. HbA1C: No results for input(s): HGBA1C in the last 72 hours. CBG:  Recent Labs Lab 03/01/16 1702 03/01/16 2104 03/02/16 0624 03/02/16 0730 03/02/16 1232  GLUCAP 257* 277* 297* 283* 261*   Lipid Profile: No results for input(s): CHOL, HDL, LDLCALC, TRIG, CHOLHDL, LDLDIRECT in the last 72 hours. Thyroid Function Tests: No results for input(s): TSH, T4TOTAL, FREET4, T3FREE, THYROIDAB in the last 72 hours. Anemia Panel: No results for input(s): VITAMINB12, FOLATE, FERRITIN, TIBC, IRON, RETICCTPCT in the last 72 hours. Sepsis Labs: No results for input(s): PROCALCITON, LATICACIDVEN in the last 168 hours.  Recent Results (from the past 240 hour(s))  MRSA PCR Screening     Status: None   Collection Time: 02/28/16  8:50 PM  Result Value Ref Range Status   MRSA by PCR NEGATIVE NEGATIVE Final    Comment:        The GeneXpert MRSA Assay (FDA approved for NASAL specimens only), is one component of a comprehensive MRSA colonization surveillance program. It is not intended to diagnose MRSA infection nor to guide or monitor treatment for MRSA infections.          Radiology Studies: No results found.      Scheduled Meds: . enoxaparin (LOVENOX) injection  40 mg Subcutaneous Q24H  . insulin aspart  0-15 Units Subcutaneous TID WC  . insulin aspart  0-5 Units Subcutaneous QHS  . insulin detemir  20 Units  Subcutaneous BID  . nitroGLYCERIN  0.5 inch Topical Q6H  . simvastatin  40 mg Oral q1800  . venlafaxine XR  150 mg Oral Q breakfast  . ziprasidone  40 mg Oral BID WC   Continuous Infusions: . sodium chloride Stopped (02/29/16 2000)  . dextrose 5 % and 0.45% NaCl Stopped (02/29/16 2101)     LOS: 3 days   Time spent: > 35 minutes  Staci Carver,  MD [facility] Pager [phone]  If 7PM-7AM, please contact night-coverage [email or web address] Password [email or web address] 03/02/2016, 5:06 PM

## 2016-03-03 ENCOUNTER — Inpatient Hospital Stay
Admission: EM | Admit: 2016-03-03 | Discharge: 2016-03-07 | DRG: 885 | Disposition: A | Discharge status: Home / Self Care | Payer: Medicare Other | Source: Intra-hospital | Attending: Psychiatry | Admitting: Psychiatry

## 2016-03-03 DIAGNOSIS — Z818 Family history of other mental and behavioral disorders: Secondary | ICD-10-CM | POA: Diagnosis not present

## 2016-03-03 DIAGNOSIS — E119 Type 2 diabetes mellitus without complications: Secondary | ICD-10-CM | POA: Diagnosis present

## 2016-03-03 DIAGNOSIS — Z9889 Other specified postprocedural states: Secondary | ICD-10-CM | POA: Diagnosis not present

## 2016-03-03 DIAGNOSIS — N179 Acute kidney failure, unspecified: Secondary | ICD-10-CM | POA: Diagnosis present

## 2016-03-03 DIAGNOSIS — Z9119 Patient's noncompliance with other medical treatment and regimen: Secondary | ICD-10-CM | POA: Diagnosis not present

## 2016-03-03 DIAGNOSIS — Z888 Allergy status to other drugs, medicaments and biological substances status: Secondary | ICD-10-CM

## 2016-03-03 DIAGNOSIS — Z590 Homelessness: Secondary | ICD-10-CM

## 2016-03-03 DIAGNOSIS — R45851 Suicidal ideations: Secondary | ICD-10-CM | POA: Diagnosis present

## 2016-03-03 DIAGNOSIS — I10 Essential (primary) hypertension: Secondary | ICD-10-CM

## 2016-03-03 DIAGNOSIS — F1220 Cannabis dependence, uncomplicated: Secondary | ICD-10-CM | POA: Diagnosis present

## 2016-03-03 DIAGNOSIS — G4700 Insomnia, unspecified: Secondary | ICD-10-CM | POA: Diagnosis present

## 2016-03-03 DIAGNOSIS — Z79899 Other long term (current) drug therapy: Secondary | ICD-10-CM | POA: Diagnosis not present

## 2016-03-03 DIAGNOSIS — Z794 Long term (current) use of insulin: Secondary | ICD-10-CM | POA: Diagnosis not present

## 2016-03-03 DIAGNOSIS — E7800 Pure hypercholesterolemia, unspecified: Secondary | ICD-10-CM | POA: Diagnosis present

## 2016-03-03 DIAGNOSIS — E785 Hyperlipidemia, unspecified: Secondary | ICD-10-CM | POA: Diagnosis present

## 2016-03-03 DIAGNOSIS — Z833 Family history of diabetes mellitus: Secondary | ICD-10-CM | POA: Diagnosis not present

## 2016-03-03 DIAGNOSIS — F250 Schizoaffective disorder, bipolar type: Principal | ICD-10-CM | POA: Diagnosis present

## 2016-03-03 DIAGNOSIS — E0810 Diabetes mellitus due to underlying condition with ketoacidosis without coma: Secondary | ICD-10-CM

## 2016-03-03 LAB — GLUCOSE, CAPILLARY
GLUCOSE-CAPILLARY: 180 mg/dL — AB (ref 65–99)
GLUCOSE-CAPILLARY: 387 mg/dL — AB (ref 65–99)
GLUCOSE-CAPILLARY: 320 mg/dL — AB (ref 65–99)
Glucose-Capillary: 234 mg/dL — ABNORMAL HIGH (ref 65–99)
Glucose-Capillary: 328 mg/dL — ABNORMAL HIGH (ref 65–99)

## 2016-03-03 MED ORDER — ALUM & MAG HYDROXIDE-SIMETH 200-200-20 MG/5ML PO SUSP: 30.0000 mL | ORAL | Status: DC | PRN

## 2016-03-03 MED ORDER — ZOLPIDEM TARTRATE 5 MG PO TABS: 5.0000 mg | ORAL_TABLET | Freq: Every evening | ORAL | Status: DC | PRN

## 2016-03-03 MED ORDER — INSULIN ASPART 100 UNIT/ML ~~LOC~~ SOLN
0.0000 [IU] | Freq: Three times a day (TID) | SUBCUTANEOUS | Status: DC
  Administered 2016-03-04: 5 [IU] via SUBCUTANEOUS
  Administered 2016-03-04: 11 [IU] via SUBCUTANEOUS
  Administered 2016-03-04: 3 [IU] via SUBCUTANEOUS
  Administered 2016-03-05 (×2): 5 [IU] via SUBCUTANEOUS
  Administered 2016-03-05: 8 [IU] via SUBCUTANEOUS
  Administered 2016-03-06: 2 [IU] via SUBCUTANEOUS
  Administered 2016-03-06 (×2): 3 [IU] via SUBCUTANEOUS
  Administered 2016-03-07: 5 [IU] via SUBCUTANEOUS
  Administered 2016-03-07: 3 [IU] via SUBCUTANEOUS
  Filled 2016-03-03: qty 8
  Filled 2016-03-03: qty 3
  Filled 2016-03-03: qty 5
  Filled 2016-03-03: qty 2
  Filled 2016-03-03: qty 3
  Filled 2016-03-03: qty 4
  Filled 2016-03-03: qty 3
  Filled 2016-03-03: qty 5
  Filled 2016-03-03: qty 3
  Filled 2016-03-03: qty 11
  Filled 2016-03-03 (×2): qty 5

## 2016-03-03 MED ORDER — ACETAMINOPHEN 325 MG PO TABS: 650.0000 mg | ORAL_TABLET | Freq: Four times a day (QID) | ORAL | Status: DC | PRN

## 2016-03-03 MED ORDER — MAGNESIUM HYDROXIDE 400 MG/5ML PO SUSP: 30.0000 mL | Freq: Every day | ORAL | Status: DC | PRN

## 2016-03-03 MED ORDER — INSULIN ASPART 100 UNIT/ML ~~LOC~~ SOLN
0.0000 [IU] | Freq: Every day | SUBCUTANEOUS | Status: DC
  Administered 2016-03-03: 4 [IU] via SUBCUTANEOUS
  Administered 2016-03-04: 2 [IU] via SUBCUTANEOUS
  Administered 2016-03-05: 4 [IU] via SUBCUTANEOUS
  Administered 2016-03-06: 3 [IU] via SUBCUTANEOUS
  Filled 2016-03-03: qty 3
  Filled 2016-03-03: qty 4
  Filled 2016-03-03: qty 2

## 2016-03-03 MED ORDER — SIMVASTATIN 40 MG PO TABS
40.0000 mg | ORAL_TABLET | Freq: Every day | ORAL | Status: DC
  Administered 2016-03-04 – 2016-03-07 (×4): 40 mg via ORAL
  Filled 2016-03-03 (×4): qty 1

## 2016-03-03 MED ORDER — VENLAFAXINE HCL ER 75 MG PO CP24
150.0000 mg | ORAL_CAPSULE | Freq: Every day | ORAL | Status: DC
  Administered 2016-03-04 – 2016-03-07 (×4): 150 mg via ORAL
  Filled 2016-03-03 (×4): qty 2

## 2016-03-03 MED ORDER — GLIPIZIDE 10 MG PO TABS
10.0000 mg | ORAL_TABLET | Freq: Two times a day (BID) | ORAL | Status: DC
  Administered 2016-03-04 – 2016-03-07 (×8): 10 mg via ORAL
  Filled 2016-03-03 (×8): qty 1

## 2016-03-03 MED ORDER — METFORMIN HCL 500 MG PO TABS
1000.0000 mg | ORAL_TABLET | Freq: Two times a day (BID) | ORAL | Status: DC
  Administered 2016-03-04 – 2016-03-07 (×8): 1000 mg via ORAL
  Filled 2016-03-03 (×8): qty 2

## 2016-03-03 MED ORDER — INSULIN DETEMIR 100 UNIT/ML ~~LOC~~ SOLN
25.0000 [IU] | Freq: Two times a day (BID) | SUBCUTANEOUS | Status: DC
  Administered 2016-03-03 – 2016-03-06 (×6): 25 [IU] via SUBCUTANEOUS
  Filled 2016-03-03 (×8): qty 0.25

## 2016-03-03 MED ORDER — ZIPRASIDONE HCL 40 MG PO CAPS
40.0000 mg | ORAL_CAPSULE | Freq: Two times a day (BID) | ORAL | Status: DC
  Administered 2016-03-04 – 2016-03-07 (×8): 40 mg via ORAL
  Filled 2016-03-03 (×8): qty 1

## 2016-03-03 MED ORDER — NITROGLYCERIN 0.4 MG SL SUBL: 0.4000 mg | SUBLINGUAL_TABLET | SUBLINGUAL | Status: DC | PRN

## 2016-03-03 MED ORDER — INSULIN DETEMIR 100 UNIT/ML ~~LOC~~ SOLN
25.0000 [IU] | Freq: Two times a day (BID) | SUBCUTANEOUS | Status: DC
  Filled 2016-03-03: qty 0.25

## 2016-03-03 MED ORDER — IBUPROFEN 600 MG PO TABS
600.0000 mg | ORAL_TABLET | Freq: Four times a day (QID) | ORAL | Status: DC | PRN
  Administered 2016-03-04: 600 mg via ORAL
  Filled 2016-03-03: qty 1
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
## 2016-03-03 NOTE — BH Assessment (Addendum)
[facility].  Accepting physician is Dr. Jennet MaduroPucilowska.  Attending Physician will be Dr. Jennet MaduroPucilowska.  Patient [facility] room 315, [facility] AripekaPhyllis.  Call report to [phone].  Representative/Transfer Coordinator is Dyanne Ihaalvin  [facility] Staff Harriette Bouillon(Shaquanda, RN) made aware of acceptance. [facility] Lillia [facility]([place] is aware of admission.  [facility], by [facility]essel[facility](Maria).

## 2016-03-03 NOTE — Care Management Important Message (Signed)
Important Message  Patient Details  Name: Joseph LefortReginald Vasquez MRN: [id number] Date of Birth: 12/18/1960   Medicare Important Message Given:  Yes    [name] [name] 03/03/2016, 11:27 AM

## 2016-03-03 NOTE — Progress Notes (Signed)
Inpatient Diabetes Program Recommendations  AACE/ADA: New Consensus Statement on Inpatient Glycemic Control (2015)  Target Ranges:  Prepandial:   less than 140 mg/dL      Peak postprandial:   less than 180 mg/dL (1-2 hours)      Critically ill patients:  140 - 180 mg/dL   Results for [name], [name] (MRN [id number]) as of 03/03/2016 12:24  Ref. Range 03/02/2016 07:30 03/02/2016 12:32 03/02/2016 16:55 03/02/2016 20:57 03/03/2016 07:56  Glucose-Capillary Latest Ref Range: 65 - 99 mg/dL 191283 (H) 478261 (H) 295224 (H) 248 (H) 180 (H)   Review of Glycemic Control  Diabetes history: DM2 Outpatient Diabetes medications: Levemir 40 units daily, Novolog 8 units TID with meals, Metformin 1000 mg BID, Glipizide 10 mg daily Current orders for Inpatient glycemic control: Levemir 22 units BID, Novolog 0-15 units TID with meals, Novolog 0-5 units QHS  Inpatient Diabetes Program Recommendations: Insulin - Meal Coverage: Post prandial glucose is consistently elevated. Please consider ordering Novolog 5 units TID with meals for meal coverage (in addition to Novolog correction scale).  Thanks, Orlando PennerMarie Haruto Demaria, RN, MSN, CDE Diabetes Coordinator Inpatient Diabetes Program [phone] (Team Pager from 8am to 5pm) [phone] ([facility] office) [phone] [facility] office) [phone] [facility] office)

## 2016-03-03 NOTE — Progress Notes (Signed)
Pt prepared for d/c to [facility]. IV d/c'd. Skin intact except as charted in most recent assessments. Vitals are stable. Report called to receiving facility, Sharrie Rothmanasey RN. Relayed message to facility that pt did not receive his 2200 medications before transportation arrived. Pt to be transported by [facility].

## 2016-03-03 NOTE — Progress Notes (Signed)
Patient ID: [name], male   DOB: 03/11/1961, 55 y.o.   MRN: [id number]                                                                PROGRESS NOTE                                                                                                                                                                                                             Patient Demographics:    [name], is a 55 y.o. male, DOB - [birth year], WUJ:[id number]  Admit date - 02/28/2016   Admitting Physician Michael Litter, MD  Outpatient Primary MD for the patient is PROVIDER NOT IN SYSTEM  LOS - 4  Outpatient Specialists:  Chief Complaint  Patient presents with  . Hyperglycemia       Brief Narrative  55 y.o.gentleman with a history of uncontrolled Type 2 DM (last A1c 11.8 in July), HTN, dyslipidemia, polysubstance abuse, hepatitis C, schizophrenia, bipolar disorder, and recent Behavioral Health admission for psychosis and suicidal ideation. He presents to the ED today complaining for 10/10 substernal chest pressure, acute onset while mowing the lawn, which is routine for him. He says that he has never had symptoms like this before. The chest discomfort has been associated with shortness of breath, light-headedness, and diaphoresis. He has also had nausea, vomiting, and self-limited diarrhea (one day). No reported blood in his emesis or stool. No LOC. He has not found any alleviating factors. He reports, "I am feeling suicidal because my chest pain is so bad." He admits to active cocaine use, last use three days ago.   Subjective:    [name] today still apparently voices suicidal ideation.  He is awaiting placement.   No headache, No chest pain, No abdominal pain - No Nausea, No new weakness tingling or numbness, No Cough - SOB.    Assessment  & Plan :    Principal Problem:   DKA (diabetic ketoacidoses) (HCC) Active Problems:   HTN (hypertension)   Hyperlipidemia   Cocaine use disorder,  moderate, dependence (HCC)   Schizoaffective disorder, bipolar type (HCC)   Suicidal ideation   Cannabis use disorder, moderate, dependence (HCC)   Chest pain   AKI (acute kidney injury) (HCC)   DKA (diabetic ketoacidoses) (HCC) - Resolved -  placed on insulin SQ Levemir increase to 26 units [place] bid- Continue SSI and night coverage  Active Problems:   HTN (hypertension) - stable     Hyperlipidemia - stable on simvastatin    Cocaine use disorder, moderate, dependence (HCC) - Will recommend cessation    Schizoaffective disorder, bipolar type [facility]) -Psychiatry consulted.     Suicidal ideation - Awaiting Psychiatry evaluation    Cannabis use disorder, moderate, dependence (HCC)   Chest pain - troponin WNL on last check. No chest pain reported to me by patient today.    AKI (acute kidney injury) (HCC) - resolved and S creatinine within normal limites  DVT prophylaxis: Lovenox Code Status: Full Family Communication: None at bedside Disposition Plan: Awaiting transfer to [facility]    Lab Results  Component Value Date   PLT 245 02/28/2016    Antibiotics  :    Anti-infectives    None        Objective:   Vitals:   03/02/16 1420 03/02/16 2103 03/03/16 0617 03/03/16 1524  BP: 128/72 107/74 115/76 119/73  Pulse: 62 (!) 54 (!) 53 63  Resp: Temp: 98.4 F (36.9 C) 98.1 F (36.7 C) 98.2 F (36.8 C) 98.4 F (36.9 C)  TempSrc:  Oral Oral Oral  SpO2: 100% 94% 100% 100%  Weight:      Height:        Wt Readings from Last 3 Encounters:  02/28/16 92.7 kg (204 lb 5.9 oz)  02/04/16 100.9 kg (222 lb 6.4 oz)  01/14/15 102.1 kg (225 lb)     Intake/Output Summary (Last 24 hours) at 03/03/16 1731 Last data filed at 03/03/16 1300  Gross per 24 hour  Intake              800 ml  Output              200 ml  Net              600 ml     Physical Exam  Awake Alert, Oriented X 3, No new F.N deficits, Normal affect [place].AT,PERRAL Supple Neck,No JVD, No  cervical lymphadenopathy appriciated.  Symmetrical Chest wall movement, Good air movement bilaterally, CTAB RRR,No Gallops,Rubs or new Murmurs, No Parasternal Heave +ve B.Sounds, Abd Soft, No tenderness, No organomegaly appriciated, No rebound - guarding or rigidity. No Cyanosis, Clubbing or edema, No new Rash or bruise      Data Review:    CBC  Recent Labs Lab 02/28/16 1631 02/28/16 1637  WBC 8.6  --   HGB 13.8 13.6  HCT 38.6* 40.0  PLT 245  --   MCV 82.5  --   MCH 29.5  --   MCHC 35.8  --   RDW NOT CALCULATED  --   LYMPHSABS 0.7  --   MONOABS 0.4  --   EOSABS 0.0  --   BASOSABS 0.0  --     Chemistries   Recent Labs Lab 02/28/16 2034 02/28/16 2110 02/29/16 0058 02/29/16 0456 02/29/16 0848 02/29/16 1909  NA  --  130* 135 136 135 134*  K  --  4.0 3.1* 3.3* 3.3* 3.9  CL  --  98* 106 103 105 105  CO2  --  19* 21*  GLUCOSE  --  355* 148* 168* 164* 292*  BUN  --  CREATININE  --  1.60* 1.21 1.19 1.08 1.09  CALCIUM  --  9.9  9.1 9.2 9.1 9.1  MG  --  1.9  --   --   --   --   AST 17  --   --   --   --   --   ALT 16*  --   --   --   --   --   ALKPHOS 65  --   --   --   --   --   BILITOT 1.4*  --   --   --   --   --    ------------------------------------------------------------------------------------------------------------------ No results for input(s): CHOL, HDL, LDLCALC, TRIG, CHOLHDL, LDLDIRECT in the last 72 hours.  Lab Results  Component Value Date   HGBA1C 11.8 (H) 02/08/2016   ------------------------------------------------------------------------------------------------------------------ No results for input(s): TSH, T4TOTAL, T3FREE, THYROIDAB in the last 72 hours.  Invalid input(s): FREET3 ------------------------------------------------------------------------------------------------------------------ No results for input(s): VITAMINB12, FOLATE, FERRITIN, TIBC, IRON, RETICCTPCT in the last 72 hours.  Coagulation  profile No results for input(s): INR, PROTIME in the last 168 hours.  No results for input(s): DDIMER in the last 72 hours.  Cardiac Enzymes  Recent Labs Lab 02/28/16 2110 02/29/16 0058 02/29/16 0456  TROPONINI <0.03 <0.03 <0.03   ------------------------------------------------------------------------------------------------------------------ No results found for: BNP  Inpatient Medications  Scheduled Meds: . enoxaparin (LOVENOX) injection  40 mg Subcutaneous Q24H  . insulin aspart  0-15 Units Subcutaneous TID WC  . insulin aspart  0-5 Units Subcutaneous QHS  . insulin detemir  22 Units Subcutaneous BID  . nitroGLYCERIN  0.5 inch Topical Q6H  . simvastatin  40 mg Oral q1800  . venlafaxine XR  150 mg Oral Q breakfast  . ziprasidone  40 mg Oral BID WC   Continuous Infusions: . sodium chloride Stopped (02/29/16 2000)  . dextrose 5 % and 0.45% NaCl Stopped (02/29/16 2101)   PRN Meds:.acetaminophen, ibuprofen, nitroGLYCERIN, ondansetron (ZOFRAN) IV, zolpidem  Micro Results Recent Results (from the past 240 hour(s))  MRSA PCR Screening     Status: None   Collection Time: 02/28/16  8:50 PM  Result Value Ref Range Status   MRSA by PCR NEGATIVE NEGATIVE Final    Comment:        The GeneXpert MRSA Assay (FDA approved for NASAL specimens only), is one component of a comprehensive MRSA colonization surveillance program. It is not intended to diagnose MRSA infection nor to guide or monitor treatment for MRSA infections.     Radiology Reports Dg Chest Port 1 View  Result Date: 02/28/2016 CLINICAL DATA:  Chest pain. EXAM: PORTABLE CHEST 1 VIEW COMPARISON:  Radiographs of February 26, 2013. FINDINGS: The heart size and mediastinal contours are within normal limits. Both lungs are clear. No pneumothorax or pleural effusion is noted. The visualized skeletal structures are unremarkable. IMPRESSION: No acute cardiopulmonary abnormality seen. Electronically Signed   By: Lupita RaiderJames  Green  Jr, M.D.   On: 02/28/2016 16:58    Time Spent in minutes  30   Pearson GrippeJames Breckin Savannah M.D on 03/03/2016 at 5:31 PM  Between 7am to 7pm - Pager - [phone]  After 7pm go to [email or web address] - password [email or web address]  [facility] -  Office  [phone]

## 2016-03-03 NOTE — Clinical Social Work Note (Signed)
LCSW updated patient medically stable for discharge. LCSW has made appropriate IP Psych placemen referrals, awaiting referral reviews.  Marcelline DeistEmily Betha Shadix, LCSW [phone] Orthopedic Social Worker

## 2016-03-04 DIAGNOSIS — F250 Schizoaffective disorder, bipolar type: Principal | ICD-10-CM

## 2016-03-04 LAB — GLUCOSE, CAPILLARY
GLUCOSE-CAPILLARY: 193 mg/dL — AB (ref 65–99)
GLUCOSE-CAPILLARY: 221 mg/dL — AB (ref 65–99)
GLUCOSE-CAPILLARY: 225 mg/dL — AB (ref 65–99)
Glucose-Capillary: 304 mg/dL — ABNORMAL HIGH (ref 65–99)

## 2016-03-04 NOTE — Progress Notes (Signed)
Recreation Therapy Notes  At approximately 12:56 pm, LRT attempted assessment. Patient refused stating he had a tooth ache and requested for LRT to come back tomorrow.  Jacquelynn CreeGreene,[name], LRT/CTRS 03/04/2016 1:28 PM

## 2016-03-04 NOTE — Tx Team (Signed)
Interdisciplinary Treatment Plan Update (Adult)         Date: 03/04/2016   Time Reviewed: 10:30 AM   Progress in Treatment: Improving Attending groups: Yes  Participating in groups: Yes  Taking medication as prescribed: Yes  Tolerating medication: Yes  Family/Significant other contact made: Yes, CSW has spoken with mother, Wells Guiles. Patient understands diagnosis: Yes  Discussing patient identified problems/goals with staff: Yes  Medical problems stabilized or resolved: Yes  Denies suicidal/homicidal ideation: Yes  Issues/concerns per patient self-inventory: Yes  Other:   New problem(s) identified: N/A   Discharge Plan or Barriers: see below   Reason for Continuation of Hospitalization:   Depression   Anxiety   Medication Stabilization   Comments: N/A   Estimated length of stay: 3-5 days    Patient is a 55 year old male admitted for major depression. Patient lives in [place], [place].   Patient will benefit from crisis stabilization, medication evaluation, group therapy, and psycho education in addition to case management for discharge planning. Patient and CSW reviewed pt's identified goals and treatment plan. Pt verbalized understanding and agreed to treatment plan.    Review of initial/current patient goals per problem list:  1. Goal(s): Patient will participate in aftercare plan   Met: Goal progressing   Target date: 3-5 days post admission date   As evidenced by: Patient will participate within aftercare plan AEB aftercare provider and housing plan at discharge being identified.   CSW assessing proper aftercare plans.  2. Goal (s): Patient will exhibit decreased depressive symptoms and suicidal ideations.   Met: Goal progressing   Target date: 3-5 days post admission date   As evidenced by: Patient will utilize self-rating of depression at 3 or below and demonstrate decreased signs of depression or be deemed stable for discharge by MD.   Patient  reports a depression score of 5 at this time. Pt denies SI/HI.    3. Goal(s): Patient will demonstrate decreased signs and symptoms of anxiety.   Met: Goal progressing   Target date: 3-5 days post admission date   As evidenced by: Patient will utilize self-rating of anxiety at 3 or below and demonstrated decreased signs of anxiety, or be deemed stable for discharge by MD   Pt reports an anxiety score of 4 at this time.   Attendees:  Patient: [name] Family:  Physician: Orson Slick, MD    03/04/2016 10:30 AM  Nursing: Elige Radon, RN     03/04/2016 10:30 AM  Clinical Social Worker: Glorious Peach                         03/04/2016  10:30AM Clinical Social Worker: Marylou Flesher, [place]  03/04/2016 10:30 AM  Recreational Therapist: Everitt Amber                         03/04/2016  10:30 AM

## 2016-03-04 NOTE — [facility] Suicide Risk Assessment (Signed)
[facility] Assessment   Nursing information obtained from:    Demographic factors:    Current Mental Status:    Loss Factors:    Historical Factors:    Risk Reduction Factors:     Total Time spent with patient: 1 hour Principal Problem: Schizoaffective disorder, bipolar type (HCC) Diagnosis:   Patient Active Problem List   Diagnosis Date Noted  . DKA (diabetic ketoacidoses) (HCC) [E13.10] 02/28/2016  . Chest pain [R07.9] 02/28/2016  . AKI (acute kidney injury) (HCC) [N17.9] 02/28/2016  . Suicidal ideation [R45.851] 02/06/2016  . Cannabis use disorder, moderate, dependence (HCC) [F12.20] 02/06/2016  . Schizoaffective disorder, bipolar type (HCC) [F25.0] 02/05/2016  . Hyperlipidemia [E78.5] 01/18/2015  . Hyperprolactinemia (HCC) [E22.1] 01/18/2015  . Diabetes mellitus (HCC) [E11.9] 03/11/2012  . HTN (hypertension) [I10] 03/11/2012   Subjective Data: Depression, substance abuse, suicidal ideation.  Continued Clinical Symptoms:  Alcohol Use Disorder Identification Test Final Score (AUDIT): 0 The "Alcohol Use Disorders Identification Test", Guidelines for Use in Primary Care, Second Edition.  World Science writerHealth Organization [facility](WHO). Score between 0-7:  no or low risk or alcohol related problems. Score between 8-15:  moderate risk of alcohol related problems. Score between 16-19:  high risk of alcohol related problems. Score 20 or above:  warrants further diagnostic evaluation for alcohol dependence and treatment.   CLINICAL FACTORS:   Bipolar Disorder:   Depressive phase Alcohol/Substance Abuse/Dependencies   Musculoskeletal: Strength & Muscle Tone: within normal limits Gait & Station: normal Patient leans: N/A  Psychiatric Specialty Exam: Physical Exam  Nursing note and vitals reviewed.   Review of Systems  Psychiatric/Behavioral: Positive for depression, substance abuse and suicidal ideas.  All other systems reviewed and are negative.   Blood pressure  129/75, pulse (!) 52, temperature 98.2 F (36.8 C), resp. rate 18, height 5\' 9"  (1.753 m), weight 99.8 kg (220 lb), SpO2 100 %.Body mass index is 32.49 kg/m.  General Appearance: Casual  Eye Contact:  Good  Speech:  Clear and Coherent  Volume:  Normal  Mood:  Depressed, Hopeless and Worthless  Affect:  Blunt  Thought Process:  Goal Directed  Orientation:  Full (Time, Place, and Person)  Thought Content:  WDL  Suicidal Thoughts:  Yes.  with intent/plan  Homicidal Thoughts:  No  Memory:  Immediate;   Fair Recent;   Fair Remote;   Fair  Judgement:  Impaired  Insight:  Lacking  Psychomotor Activity:  Normal  Concentration:  Concentration: Fair and Attention Span: Fair  Recall:  FiservFair  Fund of Knowledge:  Fair  Language:  Fair  Akathisia:  No  Handed:  Right  AIMS (if indicated):     Assets:  Communication Skills Desire for Improvement Financial Resources/Insurance Resilience Social Support  ADL's:  Intact  Cognition:  WNL  Sleep:  Number of Hours: 5.5      COGNITIVE FEATURES THAT CONTRIBUTE TO RISK:  None    SUICIDE RISK:   Moderate:  Frequent suicidal ideation with limited intensity, and duration, some specificity in terms of plans, no associated intent, good self-control, limited dysphoria/symptomatology, some risk factors present, and identifiable protective factors, including available and accessible social support.   PLAN OF CARE: Hospital admission, medication management, substance abuse counseling, discharge planning.  [name] is a 55 year old male with history of depression, mood instability, and substance abuse who was transferred to psychiatry from medical floor where he was hospitalized for DKA for suicidal ideation the context of treatment noncompliance and severe social stressors.  1.  Suicidal ideation. The patient is able to contract for safety in the hospital.   2. Mood. We restarted Effexor for depression and Geodon for mood stabiization and  psychosis.  3. Diabetes. He is on glipizide, metformin, insulin, ADA diet, blood glucose monitoring and sliding insulin.   4. Dyslipidemia. He is on Zocor.  5. Insomnia. He is on Ambien.  6. Substance abuse treatment. He is positive for cocaine and cannabinoids. He desires residential treatment.  7. Metabolic syndrome screening. Lab performed during previous admission. Lipid panel shows elevated cholesterol. TSH is normal. Hemoglobin A1c and prolactin are pending.   8. Disposition. The patient wants to be placed. SW to assist.    I certify that inpatient services furnished can reasonably be expected to improve the patient's condition.  Kristine Linea, MD 03/04/2016, 2:21 PM

## 2016-03-04 NOTE — Progress Notes (Signed)
D: Awake and alert up on unit today. Denies SI/HI/AVH. Verbally contracts for safety. C/o headache this morning, has resolved.  A: Medications administered as ordered, PRN for headache. Support and encouragement provided. Every 15 minute checks for safety.   R: Medication compliant. Calm and cooperative. Attended groups. Safety maintained.

## 2016-03-04 NOTE — Progress Notes (Signed)
Patient stated his wallet and cellphone were not in his belongings. XWRUEAVWUShaquanda [facility] She stated that his belongings were left at the hospital and they were in an envelope locked up. She stated they were going to find a way to get them over.

## 2016-03-04 NOTE — Plan of Care (Signed)
Problem: Activity: Goal: Interest or engagement in activities will improve Outcome: Progressing Pt slow to rise, get up on unit this morning, but has been up on unit most of the day since mid morning. Encouraged attendance in groups. Attended groups. Will continue to monitor.

## 2016-03-04 NOTE — Progress Notes (Signed)
Patient ID: Joseph LefortReginald Vasquez, male   DOB: 11/30/1960, 55 y.o.   MRN: [id number] [facility]ing some SI. Patient states his stressors are that "they still haven't found who killed my stepson." Also some financial and health stressors. Patient currently denies SI. Endorses AH telling him to kill himself. Patient stated he's been offered ECT in the past but has refused. He stated he wants to try it now since he's has had so many admissions lately. Patient oriented to the unit. CBG HS 320. 4 units of sliding scale Insulin given with 25 units of Levemir. Vital signs stable. Safety maintained with 15 min checks.

## 2016-03-04 NOTE — Tx Team (Signed)
Initial Interdisciplinary Treatment Plan   PATIENT STRESSORS: Financial difficulties Health problems Loss of [place]   PATIENT STRENGTHS: General fund of knowledge Motivation for treatment/growth Supportive family/friends   PROBLEM LIST: Problem List/Patient Goals Date to be addressed Date deferred Reason deferred Estimated date of resolution  Depression 03/04/16     Suicidal Ideation  03/04/16     Diabetes  03/04/16                                          DISCHARGE CRITERIA:  Improved stabilization in mood, thinking, and/or behavior  PRELIMINARY DISCHARGE PLAN: Outpatient therapy  PATIENT/FAMIILY INVOLVEMENT: This treatment plan has been presented to and reviewed with the patient, [name], and/or family member.  The patient and family have been given the opportunity to ask questions and make suggestions.  Lendell CapriceCasey N Elley Harp 03/04/2016, 4:43 AM

## 2016-03-04 NOTE — NC FL2 (Signed)
[place] MEDICAID FL2 LEVEL OF CARE SCREENING TOOL     IDENTIFICATION  Patient Name: [name] Birthdate: 12-29-1960 Sex: male Admission Date (Current Location): 03/03/2016  [place] and [place] Number:  [address] [id number] [facility] Facility and Address:  [facility], [address], [place], [place] [address]      Provider Number: [id number]  Attending Physician Name and Address:  Shari Prows, MD  Relative Name and Phone Number:  Francesca Oman, Mother [phone]     Current Level of Care: Hospital Recommended Level of Care:  (Group home) Prior Approval Number:    Date Approved/Denied:   PASRR Number:    Discharge Plan:  (Group home)    Current Diagnoses: Patient Active Problem List   Diagnosis Date Noted  . DKA (diabetic ketoacidoses) (HCC) 02/28/2016  . Chest pain 02/28/2016  . AKI (acute kidney injury) (HCC) 02/28/2016  . Suicidal ideation 02/06/2016  . Cannabis use disorder, moderate, dependence (HCC) 02/06/2016  . Schizoaffective disorder, bipolar type (HCC) 02/05/2016  . Hyperlipidemia 01/18/2015  . Hyperprolactinemia (HCC) 01/18/2015  . Diabetes mellitus (HCC) 03/11/2012  . HTN (hypertension) 03/11/2012    Orientation RESPIRATION BLADDER Height & Weight     Self, Time, Situation, Place  Normal Continent Weight: 220 lb (99.8 kg) Height:   (175.3 cm)  BEHAVIORAL SYMPTOMS/MOOD NEUROLOGICAL BOWEL NUTRITION STATUS      Continent    AMBULATORY STATUS COMMUNICATION OF NEEDS Skin   Independent Verbally Normal                       Personal Care Assistance Level of Assistance              Functional Limitations Info             SPECIAL CARE FACTORS FREQUENCY                       Contractures Contractures Info: Not present    Additional Factors Info  Insulin Sliding Scale, Psychotropic       Insulin Sliding Scale Info: 0-15 units, 3 times daily        Current Medications (03/04/2016):   This is the current hospital active medication list Current Facility-Administered Medications  Medication Dose Route Frequency Provider Last Rate Last Dose  . acetaminophen (TYLENOL) tablet 650 mg  650 mg Oral Q6H PRN Jolanta B Pucilowska, MD      . alum & mag hydroxide-simeth (MAALOX/MYLANTA) 200-200-20 MG/5ML suspension 30 mL  30 mL Oral Q4H PRN Jolanta B Pucilowska, MD      . glipiZIDE (GLUCOTROL) tablet 10 mg  10 mg Oral BID AC Jolanta B Pucilowska, MD   10 mg at 03/04/16 0821  . ibuprofen (ADVIL,MOTRIN) tablet 600 mg  600 mg Oral Q6H PRN Shari Prows, MD   600 mg at 03/04/16 0821  . insulin aspart (novoLOG) injection 0-15 Units  0-15 Units Subcutaneous TID WC Shari Prows, MD   3 Units at 03/04/16 1205  . insulin aspart (novoLOG) injection 0-5 Units  0-5 Units Subcutaneous QHS Shari Prows, MD   4 Units at 03/03/16 2332  . insulin detemir (LEVEMIR) injection 25 Units  25 Units Subcutaneous BID Shari Prows, MD   25 Units at 03/04/16 0948  . magnesium hydroxide (MILK OF MAGNESIA) suspension 30 mL  30 mL Oral Daily PRN Jolanta B Pucilowska, MD      . metFORMIN (GLUCOPHAGE) tablet 1,000 mg  1,000 mg Oral  BID WC Shari ProwsJolanta B Pucilowska, MD   1,000 mg at 03/04/16 0821  . nitroGLYCERIN (NITROSTAT) SL tablet 0.4 mg  0.4 mg Sublingual Q5 min PRN Jolanta B Pucilowska, MD      . simvastatin (ZOCOR) tablet 40 mg  40 mg Oral q1800 Jolanta B Pucilowska, MD      . venlafaxine XR (EFFEXOR-XR) 24 hr capsule 150 mg  150 mg Oral Q breakfast Jolanta B Pucilowska, MD   150 mg at 03/04/16 0821  . ziprasidone (GEODON) capsule 40 mg  40 mg Oral BID WC Jolanta B Pucilowska, MD   40 mg at 03/04/16 0821  . zolpidem (AMBIEN) tablet 5 mg  5 mg Oral QHS PRN Shari ProwsJolanta B Pucilowska, MD         Discharge Medications: Please see discharge summary for a list of discharge medications.  Relevant Imaging Results:  Relevant Lab Results:   Additional Information    Lynden OxfordKadijah R Christene Pounds,  LCSW

## 2016-03-04 NOTE — B[facility] Counselor (Signed)
Adult Comprehensive Assessment  Patient ID: [name], male DOB: [birth year], 55 y.o. MRN: [id number]  Information Source: Information source: PatientFabien Travelsteadtressors:  Employment / Job issues: None - patient is disabled Surveyor, quantity / Lack of resources (include bankruptcy): Fixed income Housing / Lack of housing: Pt is living at [employer] Physical health (include injuries & life threatening diseases): Diabetes, Hep C Social relationships: None Substance abuse: Abuses crack cocaine and THC Bereavement: Step son died.   Living/Environment/Situation:  Living Arrangements: Parent Living conditions (as described by patient or guardian): Pt lives with his mother in [place]. He went to [facility] to stay with a friend prior to admission.  How long has patient lived in current situation?: "Ive always lived with my mom"  What is atmosphere in current home: Supportive  Family History:  Marital status: Divorced Divorced, when?: "Couple of years" What types of issues is patient dealing with in the relationship?: None Does patient have children?: Yes How many children?: 1 How is patient's relationship with their children?: Good relationship with adult daughter  Childhood History:  By whom was/is the patient raised?: Mother Additional childhood history information: Good childhood Description of patient's relationship with caregiver when they were a child: Good relationship Patient's description of current relationship with people who raised him/her: Very good relationship Does patient have siblings?: Yes Number of Siblings: 1 Description of patient's current relationship with siblings: Good relationship with sister Did patient suffer from severe childhood neglect?: No Has patient ever been sexually abused/assaulted/raped as an adolescent or adult?: No Was the patient ever a victim of a crime or a disaster?: No Witnessed domestic violence?: No Has patient been effected by  domestic violence as an adult?: No  Education:  Currently a Consulting civil engineer?: No Learning disability?: No  Employment/Work Situation:  Employment situation: On disability Why is patient on disability: Mental Health How long has patient been on disability: One year Patient's job has been impacted by current illness: No What is the longest time patient has a held a job?: 13 years 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 Has patient ever been in the [employer]?: No Has patient ever served in Buyer, retail?: No  Financial Resources:  Surveyor, quantity resources: Administrator, Civil Service SSDI;Medicaid;Medicare Does patient have a representative payee or guardian?: No  Alcohol/Substance Abuse: Pt has been using THC and crack cocaine If attempted suicide, did drugs/alcohol play a role in this?: No Alcohol/Substance Abuse Treatment Hx: Past Tx, Inpatient If yes, describe treatment: [facility], [facility] Has alcohol/substance abuse ever caused legal problems?: No  Social Support System:  Conservation officer, nature Support System: Fair Museum/gallery exhibitions officer System: Mother Type of faith/religion: None How does patient's faith help to cope with current illness?: N/A  Leisure/Recreation:  Leisure and Hobbies: Watch TV  Strengths/Needs:  What things does the patient do well?: Kind to others In what areas does patient struggle / problems for patient: Drug addiction and depression  Discharge Plan:  Does patient have access to transportation?: Yes Will patient be returning to same living situation after discharge?: No, pt would like a residential substance abuse program.  Currently receiving community mental health services: No If no, would patient like referral for services when discharged?: Yes (What county?) [place] Does patient have financial barriers related to discharge medications?: No  Summary/Recommendations:  Patient is a 55 year old male admitted  with a  diagnosis of Major Depression. Patient presented to the hospital with depression, SI, HI and substance abuse. Patient reports primary triggers  for admission were substance abuse and grief. Patient will benefit from crisis stabilization, medication evaluation, group therapy and psycho education in addition to case management for discharge. At discharge, it is recommended that patient remain compliant with established discharge plan and continued treatment.   [name] MSW, LCSWA  03/04/2016 9:13 AM

## 2016-03-04 NOTE — H&P (Signed)
Psychiatric Admission Assessment Adult  Patient Identification: Joseph LefortReginald Vasquez MRN:  [id number] Date of Evaluation:  03/04/2016 Chief Complaint:  Schizoaffective disorder Principal Diagnosis: Schizoaffective disorder, bipolar type (HCC) Diagnosis:   Patient Active Problem List   Diagnosis Date Noted  . DKA (diabetic ketoacidoses) (HCC) [E13.10] 02/28/2016  . Chest pain [R07.9] 02/28/2016  . AKI (acute kidney injury) (HCC) [N17.9] 02/28/2016  . Suicidal ideation [R45.851] 02/06/2016  . Cannabis use disorder, moderate, dependence (HCC) [F12.20] 02/06/2016  . Schizoaffective disorder, bipolar type (HCC) [F25.0] 02/05/2016  . Hyperlipidemia [E78.5] 01/18/2015  . Hyperprolactinemia (HCC) [E22.1] 01/18/2015  . Diabetes mellitus (HCC) [E11.9] 03/11/2012  . HTN (hypertension) [I10] 03/11/2012   History of Present Illness:  Identifying data. [name] is a 55 year old male with history of schizoaffective disorder and substance use.  Chief complaint. "I'm suicidal."  History of present illness. Information was obtained from the patient and the chart. The patient has a long history of depression, mood instability and substance use with multiple psychiatric admissions and medication trials. During his last hospitalization Zoloft and Abilify was switched to Effexor and Geodon however the patient did not take it consistently. He was only inconsistently and his insulin and was hospitalized on the medical floor prior to admission to psychiatry. The patient became increasingly depressed with poor sleep, decreased appetite, anhedonia, feeling of guilt of energy and concentration, crying spells and social isolation. He started experiencing suicidal ideation. He did not attempt suicide this time. He denies psychotic symptoms. He reports panic attacks, social anxiety and PTSD type of symptoms. There is no OCD. He denies alcohol use but admits to smoking crack and marijuana.  Past psychiatric history. There  were multiple psychiatric hospitalization he claims for both depression and mania. He's been tried on numerous medications. He remembers lithium, Depakote, Tegretol, Seroquel and Risperdal. Last time he reported two suicide attempts by cutting and overdose. Today he threw another one by shooting. He attempted substance abuse treatment several times. [place] stayed in [place] program for 4 months until January 2017.  Family psychiatric history. Sister with schizophrenia.  Social history. He is disabled from mental illness. He is originally from [place]. He used to live with his friend but is no longer possible that he is homeless now. The patient again asks to be placed in a group home but last admission he change his mind at the last moment.  Total Time spent with patient: 1 hour  Is the patient at risk to self? Yes.    Has the patient been a risk to self in the past 6 months? Yes.    Has the patient been a risk to self within the distant past? No.  Is the patient a risk to others? No.  Has the patient been a risk to others in the past 6 months? No.  Has the patient been a risk to others within the distant past? No.   Prior Inpatient Therapy:   Prior Outpatient Therapy:    Alcohol Screening: 1. How often do you have a drink containing alcohol?: Never 2. How many drinks containing alcohol do you have on a typical day when you are drinking?: 1 or 2 3. How often do you have six or more drinks on one occasion?: Never Preliminary Score: 0 9. Have you or someone else been injured as a result of your drinking?: No 10. Has a relative or friend or a doctor or another health worker been concerned about your drinking or suggested  you cut down?: No Alcohol Use Disorder Identification Test Final Score (AUDIT): 0 Brief Intervention: AUDIT score less than 7 or less-screening does not suggest unhealthy drinking-brief intervention not indicated Substance Abuse History in  the last 12 months:  Yes.   Consequences of Substance Abuse: Negative Previous Psychotropic Medications: Yes  Psychological Evaluations: No  Past Medical History:  Past Medical History:  Diagnosis Date  . Anxiety   . Bipolar affective disorder (HCC)   . Bipolar disorder (HCC) 03/11/2012  . Depression   . Diabetes mellitus   . Diabetes mellitus (HCC) 03/11/2012  . Hepatitis 06/30/2013   Type C  . Hypertension   . Schizophrenia, schizo-affective (HCC)     Past Surgical History:  Procedure Laterality Date  . FINGER SURGERY    . WISDOM TOOTH EXTRACTION     Family History:  Family History  Problem Relation Age of Onset  . Diabetes Mother   . Bipolar disorder Sister    Tobacco Screening: Have you used any form of tobacco in the last 30 days? (Cigarettes, Smokeless Tobacco, Cigars, and/or Pipes): No Social History:  History  Alcohol Use No     History  Drug Use  . Types: Marijuana, Cocaine    Comment: 4 days ago last THC and cocoaine use... relapsed after 2 yrs     Additional Social History:                           Allergies:   Allergies  Allergen Reactions  . Wellbutrin [Bupropion] Hives and Swelling   Lab Results:  Results for orders placed or performed during the hospital encounter of 03/03/16 (from the past 48 hour(s))  Glucose, capillary     Status: Abnormal   Collection Time: 03/03/16 10:28 PM  Result Value Ref Range   Glucose-Capillary 320 (H) 65 - 99 mg/dL  Glucose, capillary     Status: Abnormal   Collection Time: 03/04/16  6:36 AM  Result Value Ref Range   Glucose-Capillary 304 (H) 65 - 99 mg/dL  Glucose, capillary     Status: Abnormal   Collection Time: 03/04/16 11:43 AM  Result Value Ref Range   Glucose-Capillary 193 (H) 65 - 99 mg/dL   Comment 1 Notify RN     Blood Alcohol level:  Lab Results  Component Value Date   ETH <5 02/04/2016   ETH <5 08/01/2015    Metabolic Disorder Labs:  Lab Results  Component Value Date   HGBA1C  11.8 (H) 02/08/2016   MPG 275 08/03/2015   MPG 295 01/17/2015   Lab Results  Component Value Date   PROLACTIN 3.4 (L) 02/08/2016   PROLACTIN 26.5 (H) 01/17/2015   Lab Results  Component Value Date   CHOL 213 (H) 02/08/2016   TRIG 127 02/08/2016   HDL 42 02/08/2016   CHOLHDL 5.1 02/08/2016   VLDL 25 02/08/2016   LDLCALC 146 (H) 02/08/2016   LDLCALC 183 (H) 08/03/2015    Current Medications: Current Facility-Administered Medications  Medication Dose Route Frequency Provider Last Rate Last Dose  . acetaminophen (TYLENOL) tablet 650 mg  650 mg Oral Q6H PRN Graeson Nouri B Chaz Ronning, MD      . alum & mag hydroxide-simeth (MAALOX/MYLANTA) 200-200-20 MG/5ML suspension 30 mL  30 mL Oral Q4H PRN Naydene Kamrowski B Dyshaun Bonzo, MD      . glipiZIDE (GLUCOTROL) tablet 10 mg  10 mg Oral BID AC Brad Lieurance B Hilja Kintzel, MD   10 mg at 03/04/16 0821  .  ibuprofen (ADVIL,MOTRIN) tablet 600 mg  600 mg Oral Q6H PRN Shari Prows, MD   600 mg at 03/04/16 0821  . insulin aspart (novoLOG) injection 0-15 Units  0-15 Units Subcutaneous TID WC Shari Prows, MD   3 Units at 03/04/16 1205  . insulin aspart (novoLOG) injection 0-5 Units  0-5 Units Subcutaneous QHS Shari Prows, MD   4 Units at 03/03/16 2332  . insulin detemir (LEVEMIR) injection 25 Units  25 Units Subcutaneous BID Shari Prows, MD   25 Units at 03/04/16 0948  . magnesium hydroxide (MILK OF MAGNESIA) suspension 30 mL  30 mL Oral Daily PRN Chamara Dyck B Amera Banos, MD      . metFORMIN (GLUCOPHAGE) tablet 1,000 mg  1,000 mg Oral BID WC Lari Linson B Jeriah Skufca, MD   1,000 mg at 03/04/16 0821  . nitroGLYCERIN (NITROSTAT) SL tablet 0.4 mg  0.4 mg Sublingual Q5 min PRN Janann Boeve B Maciah Schweigert, MD      . simvastatin (ZOCOR) tablet 40 mg  40 mg Oral q1800 Iveth Heidemann B Camila Maita, MD      . venlafaxine XR (EFFEXOR-XR) 24 hr capsule 150 mg  150 mg Oral Q breakfast Donnia Poplaski B Wilian Kwong, MD   150 mg at 03/04/16 0821  . ziprasidone (GEODON) capsule 40 mg  40  mg Oral BID WC Toribio Seiber B Pinchos Topel, MD   40 mg at 03/04/16 0821  . zolpidem (AMBIEN) tablet 5 mg  5 mg Oral QHS PRN Shari Prows, MD       PTA Medications: Prescriptions Prior to Admission  Medication Sig Dispense Refill Last Dose  . glipiZIDE (GLUCOTROL) 10 MG tablet Take 1 tablet (10 mg total) by mouth 2 (two) times daily before a meal. For diabetes management (Patient taking differently: Take 10 mg by mouth daily before breakfast. For diabetes management) 60 tablet 0 02/26/2016  . HUMALOG KWIKPEN 100 UNIT/ML KiwkPen Inject 8 Units into the muscle 3 (three) times daily with meals.   unkn  . insulin aspart (NOVOLOG) 100 UNIT/ML injection Inject 8 Units into the skin 3 (three) times daily with meals. 10 mL 11   . insulin detemir (LEVEMIR) 100 UNIT/ML injection Inject 0.5 mLs (50 Units total) into the skin at bedtime. (Patient taking differently: Inject 40 Units into the skin at bedtime. ) 10 mL 11   . metFORMIN (GLUCOPHAGE) 1000 MG tablet Take 1 tablet (1,000 mg total) by mouth 2 (two) times daily with a meal. For diabetes management 60 tablet 0   . metFORMIN (GLUCOPHAGE) 850 MG tablet Take 1 tablet by mouth 3 (three) times daily.   unkn  . simvastatin (ZOCOR) 40 MG tablet Take 1 tablet (40 mg total) by mouth daily at 6 PM. 30 tablet 0   . venlafaxine XR (EFFEXOR-XR) 150 MG 24 hr capsule Take 1 capsule (150 mg total) by mouth daily with breakfast. 30 capsule 0   . ziprasidone (GEODON) 40 MG capsule Take 1 capsule (40 mg total) by mouth 2 (two) times daily with a meal. 60 capsule 0   . zolpidem (AMBIEN) 5 MG tablet Take 1 tablet (5 mg total) by mouth at bedtime as needed for sleep. 15 tablet 0     Musculoskeletal: Strength & Muscle Tone: within normal limits Gait & Station: normal Patient leans: N/A  Psychiatric Specialty Exam: I reviewed physical examination performed on the medical floor and agree with the findings. Physical Exam  Nursing note and vitals reviewed.   Review of  Systems  Psychiatric/Behavioral: Positive for depression,  substance abuse and suicidal ideas.  All other systems reviewed and are negative.   Blood pressure 129/75, pulse (!) 52, temperature 98.2 F (36.8 C), resp. rate 18, height 5\' 9"  (1.753 m), weight 99.8 kg (220 lb), SpO2 100 %.Body mass index is 32.49 kg/m.  See SRA.                                                  Sleep:  Number of Hours: 5.5       Treatment Plan Summary: Daily contact with patient to assess and evaluate symptoms and progress in treatment and Medication management   Mr. Matera is a 55 year old male with history of depression, mood instability, and substance abuse who was transferred to psychiatry from medical floor where he was hospitalized for DKA for suicidal ideation the context of treatment noncompliance and severe social stressors.  1. Suicidal ideation. The patient is able to contract for safety in the hospital.   2. Mood. We restarted Effexor for depression and Geodon for mood stabiization and psychosis.  3. Diabetes. He is on glipizide, metformin, insulin, ADA diet, blood glucose monitoring and sliding insulin.   4. Dyslipidemia. He is on Zocor.  5. Insomnia. He is on Ambien.  6. Substance abuse treatment. He is positive for cocaine and cannabinoids. He desires residential treatment.  7. Metabolic syndrome screening. Lab performed during previous admission. Lipid panel shows elevated cholesterol. TSH is normal. Hemoglobin A1c and prolactin are pending.   8. Disposition. The patient wants to be placed. SW to assist.     Observation Level/Precautions:  15 minute checks  Laboratory:  CBC Chemistry Profile UDS UA  Psychotherapy:    Medications:    Consultations:    Discharge Concerns:    Estimated LOS:  Other:     I certify that inpatient services furnished can reasonably be expected to improve the patient's condition.    Kristine Linea, MD 8/8/20172:30  PM

## 2016-03-04 NOTE — [facility] Suicide Risk Assessment (Signed)
[facility] INPATIENT:  Family/Significant Other Suicide Prevention Education  Suicide Prevention Education:  Education Completed; mother, Francesca Omanlizabeth Andrews ph #: [phone] has been identified by the patient as the family member/significant other with whom the patient will be residing, and identified as the person(s) who will aid the patient in the event of a mental health crisis (suicidal ideations/suicide attempt).  With written consent from the patient, the family member/significant other has been provided the following suicide prevention education, prior to the and/or following the discharge of the patient. Pt's mother concerned that pt did not receive aftercare treatment after discharging last month and thinks he will benefit from group home placement. CSW is assessing proper aftercare plans and group home placement.  The suicide prevention education provided includes the following:  Suicide risk factors  Suicide prevention and interventions  National Suicide Hotline telephone number  [facility] assessment telephone number  [facility] Emergency Assistance 911  [facility] and/or Residential Mobile Crisis Unit telephone number  Request made of family/significant other to:  Remove weapons (e.g., guns, rifles, knives), all items previously/currently identified as safety concern.    Remove drugs/medications (over-the-counter, prescriptions, illicit drugs), all items previously/currently identified as a safety concern.  The family member/significant other verbalizes understanding of the suicide prevention education information provided.  The family member/significant other agrees to remove the items of safety concern listed above.  Lynden OxfordKadijah R Ammaar Encina, MSW, LCSW-A 03/04/2016, 9:44 AM

## 2016-03-04 NOTE — Progress Notes (Signed)
Recreation Therapy Notes  Date: 08.08.17 Time: 3:00 pm Location: Craft Room  Group Topic: Goal Setting  Goal Area(s) Addresses:  Patient will write at least one goal. Patient will write at least one obstacle.  Behavioral Response: Attentive, Interactive  Intervention: Recovery Goal Chart  Activity: Patients were instructed to make a Recovery Goal Chart including goals, obstacles, the date they started working on their goals, and the date they achieved their goals.  Education: LRT educated patients on healthy ways to celebrate reaching their goals.  Education Outcome: Acknowledges education/In group clarification offered   Clinical Observations/Feedback: Patient completed activity by writing goals and obstacles. Patient contributed to group discussion by stating how he can overcome his obstacles.  Jacquelynn CreeGreene,Lizzett Nobile M, LRT/CTRS 03/04/2016 4:15 PM

## 2016-03-05 LAB — GLUCOSE, CAPILLARY
GLUCOSE-CAPILLARY: 201 mg/dL — AB (ref 65–99)
GLUCOSE-CAPILLARY: 324 mg/dL — AB (ref 65–99)
Glucose-Capillary: 217 mg/dL — ABNORMAL HIGH (ref 65–99)

## 2016-03-05 MED ORDER — TUBERCULIN PPD 5 UNIT/0.1ML ID SOLN
5.0000 [IU] | Freq: Once | INTRADERMAL | Status: AC
  Administered 2016-03-05: 5 [IU] via INTRADERMAL
  Filled 2016-03-05: qty 0.1

## 2016-03-05 NOTE — [facility] Group Notes (Signed)
[facility] LCSW Group Therapy  03/05/2016 12:12 PM  Type of Therapy:  Group Therapy  Participation Level:  Pt did not attend group. CSW invited pt to group.   Summary of Progress/Problems: Emotional Regulation: Patients will identify both negative and positive emotions. They will discuss emotions they have difficulty regulating and how they impact their lives. Patients will be asked to identify healthy coping skills to combat unhealthy reactions to negative emotions.   Layne Dilauro G. Garnette CzechSampson MSW, LCSWA 03/05/2016, 12:12 PM

## 2016-03-05 NOTE — Progress Notes (Signed)
Recreation Therapy Notes  Date: 08.09.17 Time: 1:15 pm Location: Craft Room  Group Topic: Self-esteem  Goal Area(s) Addresses:  Patient will write at least one positive trait about self. Patient will verbalize benefit of having healthy self-esteem.  Behavioral Response: Did not attend  Intervention: I Am  Activity: Patients were given a worksheet with the letter I on it and instructed to write as many positive traits about themselves inside the letter.  Education: LRT educated patients on ways they can increase their self-esteem.  Education Outcome: Patient did not attend group.  Clinical Observations/Feedback: Patient did not attend group.  Jacquelynn CreeGreene,Raza Bayless M, LRT/CTRS 03/05/2016 3:18 PM

## 2016-03-05 NOTE — Progress Notes (Signed)
Recreation Therapy Notes  At approximately 2:50 pm, LRT attempted assessment. Patient refused stating he had a tooth ache and requested for LRT to come back tomorrow.  Jacquelynn CreeGreene,Titania Gault M, LRT/CTRS 03/05/2016 3:27 PM

## 2016-03-05 NOTE — Social Work (Signed)
Ms. [name] from [facility] [phone] met with pt to inquire about pt's interest for placement.   [name] from [place] [phone] will meet with pt at 6 pm to also discuss placement.   [name], MSW, LCSW-A 03/05/2016  12:26PM

## 2016-03-05 NOTE — Progress Notes (Signed)
Inpatient Diabetes Program Recommendations  AACE/ADA: New Consensus Statement on Inpatient Glycemic Control (2015)  Target Ranges:  Prepandial:   less than 140 mg/dL      Peak postprandial:   less than 180 mg/dL (1-2 hours)      Critically ill patients:  140 - 180 mg/dL  Results for [name], [name] (MRN [id number]) as of 03/05/2016 11:20  Ref. Range 03/04/2016 06:36 03/04/2016 11:43 03/04/2016 16:30 03/04/2016 20:41 03/05/2016 06:21  Glucose-Capillary Latest Ref Range: 65 - 99 mg/dL 409304 (H) 811193 (H) 914221 (H) 225 (H) 201 (H)    Review of Glycemic Control  Diabetes history: DM2 Outpatient Diabetes medications: Levemir 40 units QHS, Novolog 8 units TID with meals, Metformin 1000 mg BID, Glipizide 10 mg daily Current orders for Inpatient glycemic control: Levemir 25 units BID, Novolog 0-15 units TID with meals, Novolog 0-5 units QHS, Glipizide 10 mg BID, Metformin 1000 mg BID  Inpatient Diabetes Program Recommendations: Insulin - Basal: Please consider increasing Levemir to 27 units BID. Insulin - Meal Coverage: Post prandial glucose is consistently elevated. Please consider ordering Novolog 5 units TID with meals for meal coverage (in addition to Novolog correction scale).  Thanks, Orlando PennerMarie Jess Toney, RN, MSN, CDE Diabetes Coordinator Inpatient Diabetes Program [phone] (Team Pager from 8am to 5pm) [phone] ([facility] office) [phone] [facility] office) [phone] [facility] office)

## 2016-03-05 NOTE — Progress Notes (Signed)
[facility] MD Progress Note  03/05/2016 7:57 AM [name]  MRN:  [id number]  Subjective:  [name] was interviewed by a prospective group home today and it appears thay have a bed. The patient is rather anxious about it. He is homeless and has medical needs but has never been is a group home before. Last time he bailed out at the last moment. His depression is improving but he still has passing thoughts of suicide. He tolerates medications well. Good group participation.  Principal Problem: Schizoaffective disorder, bipolar type (HCC) Diagnosis:   Patient Active Problem List   Diagnosis Date Noted  . DKA (diabetic ketoacidoses) (HCC) [E13.10] 02/28/2016  . Chest pain [R07.9] 02/28/2016  . AKI (acute kidney injury) (HCC) [N17.9] 02/28/2016  . Suicidal ideation [R45.851] 02/06/2016  . Cannabis use disorder, moderate, dependence (HCC) [F12.20] 02/06/2016  . Schizoaffective disorder, bipolar type (HCC) [F25.0] 02/05/2016  . Hyperlipidemia [E78.5] 01/18/2015  . Hyperprolactinemia (HCC) [E22.1] 01/18/2015  . Diabetes mellitus (HCC) [E11.9] 03/11/2012  . HTN (hypertension) [I10] 03/11/2012   Total Time spent with patient: 20 minutes  Past Psychiatric History: depression, substance use.  Past Medical History:  Past Medical History:  Diagnosis Date  . Anxiety   . Bipolar affective disorder (HCC)   . Bipolar disorder (HCC) 03/11/2012  . Depression   . Diabetes mellitus   . Diabetes mellitus (HCC) 03/11/2012  . Hepatitis 06/30/2013   Type C  . Hypertension   . Schizophrenia, schizo-affective (HCC)     Past Surgical History:  Procedure Laterality Date  . FINGER SURGERY    . WISDOM TOOTH EXTRACTION     Family History:  Family History  Problem Relation Age of Onset  . Diabetes Mother   . Bipolar disorder Sister    Family Psychiatric  History: see H&P. Social History:  History  Alcohol Use No     History  Drug Use  . Types: Marijuana, Cocaine    Comment: 4 days ago last THC and  cocoaine use... relapsed after 2 yrs     Social History   Social History  . Marital status: Single    Spouse name: N/A  . Number of children: N/A  . Years of education: N/A   Social History Main Topics  . Smoking status: Never Smoker  . Smokeless tobacco: Never Used  . Alcohol use No  . Drug use:     Types: Marijuana, Cocaine     Comment: 4 days ago last THC and cocoaine use... relapsed after 2 yrs   . Sexual activity: Yes    Birth control/ protection: Condom     Comment: marijuana 25 days ago   Other Topics Concern  . None   Social History Narrative  . None   Additional Social History:                         Sleep: Fair  Appetite:  Fair  Current Medications: Current Facility-Administered Medications  Medication Dose Route Frequency Provider Last Rate Last Dose  . acetaminophen (TYLENOL) tablet 650 mg  650 mg Oral Q6H PRN Yosiah Jasmin B Jozy Mcphearson, MD      . alum & mag hydroxide-simeth (MAALOX/MYLANTA) 200-200-20 MG/5ML suspension 30 mL  30 mL Oral Q4H PRN Isrrael Fluckiger B Lakendrick Paradis, MD      . glipiZIDE (GLUCOTROL) tablet 10 mg  10 mg Oral BID AC Ardenia Stiner B Anees Vanecek, MD   10 mg at 03/04/16 1636  . ibuprofen (ADVIL,MOTRIN) tablet 600 mg  600  mg Oral Q6H PRN Shari Prows, MD   600 mg at 03/04/16 0821  . insulin aspart (novoLOG) injection 0-15 Units  0-15 Units Subcutaneous TID WC Shari Prows, MD   5 Units at 03/05/16 0643  . insulin aspart (novoLOG) injection 0-5 Units  0-5 Units Subcutaneous QHS Shari Prows, MD   2 Units at 03/04/16 2205  . insulin detemir (LEVEMIR) injection 25 Units  25 Units Subcutaneous BID Shari Prows, MD   25 Units at 03/04/16 2206  . magnesium hydroxide (MILK OF MAGNESIA) suspension 30 mL  30 mL Oral Daily PRN Ethaniel Garfield B Leeta Grimme, MD      . metFORMIN (GLUCOPHAGE) tablet 1,000 mg  1,000 mg Oral BID WC Dickey Caamano B Naveen Clardy, MD   1,000 mg at 03/04/16 1635  . nitroGLYCERIN (NITROSTAT) SL tablet 0.4 mg  0.4 mg  Sublingual Q5 min PRN Stevie Ertle B Amarie Viles, MD      . simvastatin (ZOCOR) tablet 40 mg  40 mg Oral q1800 Amberlee Garvey B Loic Hobin, MD   40 mg at 03/04/16 1636  . venlafaxine XR (EFFEXOR-XR) 24 hr capsule 150 mg  150 mg Oral Q breakfast Eleonora Peeler B Yailyn Strack, MD   150 mg at 03/04/16 0821  . ziprasidone (GEODON) capsule 40 mg  40 mg Oral BID WC Verle Wheeling B Chynna Buerkle, MD   40 mg at 03/04/16 1635  . zolpidem (AMBIEN) tablet 5 mg  5 mg Oral QHS PRN Shari Prows, MD        Lab Results:  Results for orders placed or performed during the hospital encounter of 03/03/16 (from the past 48 hour(s))  Glucose, capillary     Status: Abnormal   Collection Time: 03/03/16 10:28 PM  Result Value Ref Range   Glucose-Capillary 320 (H) 65 - 99 mg/dL  Glucose, capillary     Status: Abnormal   Collection Time: 03/04/16  6:36 AM  Result Value Ref Range   Glucose-Capillary 304 (H) 65 - 99 mg/dL  Glucose, capillary     Status: Abnormal   Collection Time: 03/04/16 11:43 AM  Result Value Ref Range   Glucose-Capillary 193 (H) 65 - 99 mg/dL   Comment 1 Notify RN   Glucose, capillary     Status: Abnormal   Collection Time: 03/04/16  4:30 PM  Result Value Ref Range   Glucose-Capillary 221 (H) 65 - 99 mg/dL  Glucose, capillary     Status: Abnormal   Collection Time: 03/04/16  8:41 PM  Result Value Ref Range   Glucose-Capillary 225 (H) 65 - 99 mg/dL   Comment 1 Notify RN   Glucose, capillary     Status: Abnormal   Collection Time: 03/05/16  6:21 AM  Result Value Ref Range   Glucose-Capillary 201 (H) 65 - 99 mg/dL    Blood Alcohol level:  Lab Results  Component Value Date   ETH <5 02/04/2016   ETH <5 08/01/2015    Metabolic Disorder Labs: Lab Results  Component Value Date   HGBA1C 11.8 (H) 02/08/2016   MPG 275 08/03/2015   MPG 295 01/17/2015   Lab Results  Component Value Date   PROLACTIN 3.4 (L) 02/08/2016   PROLACTIN 26.5 (H) 01/17/2015   Lab Results  Component Value Date   CHOL 213 (H)  02/08/2016   TRIG 127 02/08/2016   HDL 42 02/08/2016   CHOLHDL 5.1 02/08/2016   VLDL 25 02/08/2016   LDLCALC 146 (H) 02/08/2016   LDLCALC 183 (H) 08/03/2015    Physical Findings: AIMS:  , ,  ,  ,  CIWA:    COWS:     Musculoskeletal: Strength & Muscle Tone: within normal limits Gait & Station: normal Patient leans: N/A  Psychiatric Specialty Exam: Physical Exam  Nursing note and vitals reviewed.   Review of Systems  Psychiatric/Behavioral: Positive for depression, substance abuse and suicidal ideas.  All other systems reviewed and are negative.   Blood pressure 139/82, pulse (!) 54, temperature 98.1 F (36.7 C), temperature source Oral, resp. rate 20, height 5\' 9"  (1.753 m), weight 99.8 kg (220 lb), SpO2 100 %.Body mass index is 32.49 kg/m.  General Appearance: Casual  Eye Contact:  Good  Speech:  Clear and Coherent  Volume:  Normal  Mood:  Anxious  Affect:  Congruent  Thought Process:  Goal Directed  Orientation:  Full (Time, Place, and Person)  Thought Content:  WDL  Suicidal Thoughts:  Yes.  with intent/plan  Homicidal Thoughts:  No  Memory:  Immediate;   Fair Recent;   Fair Remote;   Fair  Judgement:  Poor  Insight:  Lacking  Psychomotor Activity:  Normal  Concentration:  Concentration: Fair and Attention Span: Fair  Recall:  FiservFair  Fund of Knowledge:  Fair  Language:  Fair  Akathisia:  No  Handed:  Right  AIMS (if indicated):     Assets:  Communication Skills Desire for Improvement Financial Resources/Insurance Resilience Social Support  ADL's:  Intact  Cognition:  WNL  Sleep:  Number of Hours: 7.45     Treatment Plan Summary: Daily contact with patient to assess and evaluate symptoms and progress in treatment and Medication management   [name] is a 55 year old male with history of depression, mood instability, and substance abuse who was transferred to psychiatry from medical floor where he was hospitalized for DKA for suicidal ideation the  context of treatment noncompliance and severe social stressors.  1. Suicidal ideation. The patient is able to contract for safety in the hospital.   2. Mood. We restarted Effexor for depression and Geodon for mood stabiization and psychosis.  3. Diabetes. He is on glipizide, metformin, insulin, ADA diet, blood glucose monitoring and sliding insulin.   4. Dyslipidemia. He is on Zocor.  5. Insomnia. He is on Ambien.  6. Substance abuse treatment. He is positive for cocaine and cannabinoids. He desires residential treatment.  7. Metabolic syndrome screening. Lab performed during previous admission. Lipid panel shows elevated cholesterol. TSH is normal. Hemoglobin A1c 11.8, prolactin 3.4.   8. Disposition. The patient wants to be placed. He iterviewed with a prospective group home. PPD placed.      Kristine LineaJolanta Syble Picco, MD 03/05/2016, 7:57 AM

## 2016-03-05 NOTE — Progress Notes (Signed)
Denies SI/HI/AVH.  Affect flat.  Isolates even when in milieu.  Support and encouragement offered.  Safety maintained.

## 2016-03-05 NOTE — Plan of Care (Signed)
Problem: Safety: Goal: Ability to remain free from injury will improve Outcome: Progressing Patient has remained free from harm on the unit.

## 2016-03-05 NOTE — Progress Notes (Signed)
D: Patient appears very flat. [facility] and [facility] was interested about that. Denies SI/HI/AVH. CBG HS 225. A: Medication given with education. Encouragement provided. 2 units of novolog given.  R: Patient was compliant with medication. He has remained calm and cooperative. Safety maintained with 15 min checks.

## 2016-03-06 LAB — GLUCOSE, CAPILLARY
GLUCOSE-CAPILLARY: 194 mg/dL — AB (ref 65–99)
GLUCOSE-CAPILLARY: 133 mg/dL — AB (ref 65–99)
GLUCOSE-CAPILLARY: 269 mg/dL — AB (ref 65–99)
Glucose-Capillary: 195 mg/dL — ABNORMAL HIGH (ref 65–99)

## 2016-03-06 LAB — CREATININE, SERUM
Creatinine, Ser: 0.82 mg/dL (ref 0.61–1.24)
GFR calc Af Amer: >60 mL/min (ref >60)
GFR calc non Af Amer: >60 mL/min (ref >60)

## 2016-03-06 MED ORDER — VENLAFAXINE HCL ER 150 MG PO CP24: 150.0000 mg | ORAL_CAPSULE | Freq: Every day | ORAL | 1 refills | Status: DC

## 2016-03-06 MED ORDER — INSULIN DETEMIR 100 UNIT/ML ~~LOC~~ SOLN: 27.0000 [IU] | Freq: Two times a day (BID) | SUBCUTANEOUS | 11 refills | Status: DC

## 2016-03-06 MED ORDER — SIMVASTATIN 40 MG PO TABS: 40.0000 mg | ORAL_TABLET | Freq: Every day | ORAL | 1 refills | Status: DC

## 2016-03-06 MED ORDER — ZIPRASIDONE HCL 40 MG PO CAPS: 40.0000 mg | ORAL_CAPSULE | Freq: Two times a day (BID) | ORAL | 1 refills | Status: DC

## 2016-03-06 MED ORDER — GLIPIZIDE 10 MG PO TABS: 10.0000 mg | ORAL_TABLET | Freq: Two times a day (BID) | ORAL | 1 refills | Status: DC

## 2016-03-06 MED ORDER — INSULIN ASPART 100 UNIT/ML ~~LOC~~ SOLN
5.0000 [IU] | Freq: Three times a day (TID) | SUBCUTANEOUS | Status: DC
  Administered 2016-03-07 (×2): 5 [IU] via SUBCUTANEOUS
  Filled 2016-03-06 (×2): qty 5

## 2016-03-06 MED ORDER — NITROGLYCERIN 0.4 MG SL SUBL: 0.4000 mg | SUBLINGUAL_TABLET | SUBLINGUAL | 1 refills | Status: DC | PRN

## 2016-03-06 MED ORDER — METFORMIN HCL 1000 MG PO TABS: 1000.0000 mg | ORAL_TABLET | Freq: Two times a day (BID) | ORAL | 1 refills | Status: DC

## 2016-03-06 MED ORDER — INSULIN DETEMIR 100 UNIT/ML ~~LOC~~ SOLN
27.0000 [IU] | Freq: Two times a day (BID) | SUBCUTANEOUS | Status: DC
  Administered 2016-03-06 – 2016-03-07 (×2): 27 [IU] via SUBCUTANEOUS
  Filled 2016-03-06 (×4): qty 0.27

## 2016-03-06 MED ORDER — ZOLPIDEM TARTRATE 5 MG PO TABS: 5.0000 mg | ORAL_TABLET | Freq: Every evening | ORAL | 1 refills | Status: DC | PRN

## 2016-03-06 MED ORDER — INSULIN ASPART 100 UNIT/ML ~~LOC~~ SOLN: 0.0000 [IU] | Freq: Three times a day (TID) | SUBCUTANEOUS | 11 refills | Status: DC

## 2016-03-06 NOTE — Social Work (Signed)
CSW spoke with pt and [name] at the transitional home. [name] accepted patient and will pick pt up tomorrow (8/11) at 4:00pm. [name] requested TB test and FL2. CSW faxed FL2 and RN, [name] administered TB testing.   Lynden OxfordKadijah R. Twilla Khouri, MSW, LCSW-A 03/06/16  11:14AM

## 2016-03-06 NOTE — Progress Notes (Signed)
Patient denies suicidal or homicidal ideations and AV hallucinations.Stated that he is ready for discharge.Appropriate with staff & peers.Compliant with medications.Attended groups.

## 2016-03-06 NOTE — [facility] Suicide Risk Assessment (Signed)
[facility]   Principal Problem: Schizoaffective disorder, bipolar type [facility](HCC) Discharge Diagnoses:  Patient Active Problem List   Diagnosis Date Noted  . DKA (diabetic ketoacidoses) (HCC) [E13.10] 02/28/2016  . Chest pain [R07.9] 02/28/2016  . AKI (acute kidney injury) (HCC) [N17.9] 02/28/2016  . Suicidal ideation [R45.851] 02/06/2016  . Cannabis use disorder, moderate, dependence (HCC) [F12.20] 02/06/2016  . Schizoaffective disorder, bipolar type (HCC) [F25.0] 02/05/2016  . Hyperlipidemia [E78.5] 01/18/2015  . Hyperprolactinemia (HCC) [E22.1] 01/18/2015  . Diabetes mellitus (HCC) [E11.9] 03/11/2012  . HTN (hypertension) [I10] 03/11/2012    Total Time spent with patient: 30 minutes  Musculoskeletal: Strength & Muscle Tone: within normal limits Gait & Station: normal Patient leans: N/A  Psychiatric Specialty Exam: Review of Systems  All other systems reviewed and are negative.   Blood pressure 124/83, pulse (!) 58, temperature 98 F (36.7 C), temperature source Oral, resp. rate 20, height 5\' 9"  (1.753 m), weight 99.8 kg (220 lb), SpO2 100 %.Body mass index is 32.49 kg/m.  General Appearance: Casual  Eye Contact::  Good  Speech:  Clear and Coherent409  Volume:  Normal  Mood:  Euthymic  Affect:  Appropriate  Thought Process:  Goal Directed  Orientation:  Full (Time, Place, and Person)  Thought Content:  WDL  Suicidal Thoughts:  No  Homicidal Thoughts:  No  Memory:  Immediate;   Fair Recent;   Fair Remote;   Fair  Judgement:  Impaired  Insight:  Shallow  Psychomotor Activity:  Normal  Concentration:  Fair  Recall:  FiservFair  Fund of Knowledge:Fair  Language: Fair  Akathisia:  No  Handed:  Right  AIMS (if indicated):     Assets:  Communication Skills Desire for Improvement Financial Resources/Insurance Housing Resilience Social Support  Sleep:  Number of Hours: 7.45  Cognition: WNL  ADL's:  Intact   Mental Status Per Nursing  Assessment::   On Admission:     Demographic Factors:  Male  Loss Factors: Decline in physical health  Historical Factors: Prior suicide attempts and Impulsivity  Risk Reduction Factors:   Sense of responsibility to family and Positive social support  Continued Clinical Symptoms:  Bipolar Disorder:   Depressive phase Alcohol/Substance Abuse/Dependencies Unstable or Poor Therapeutic Relationship Previous Psychiatric Diagnoses and Treatments Medical Diagnoses and Treatments/Surgeries  Cognitive Features That Contribute To Risk:  None    Suicide Risk:  Minimal: No identifiable suicidal ideation.  Patients presenting with no risk factors but with morbid ruminations; may be classified as minimal risk based on the severity of the depressive symptoms  Follow-up Information    [facility]. Go on 03/10/2016.   Why:  [facility]. You [facility] at [facility] for your intake assessment and medication management appointment. If you have questions or concerns, please contact [facility]. Contact information: [address], [place], [place] [address] Phone: [phone]          Plan Of Care/Follow-up recommendations:  Activity:  As tolerated. Diet:  Low sodium heart healthy ADA diet. Other:  Keep follow-up appointments.  Kristine LineaJolanta Pucilowska, MD 03/06/2016, 7:00 PM

## 2016-03-06 NOTE — Progress Notes (Addendum)
Inpatient Diabetes Program Recommendations  AACE/ADA: New Consensus Statement on Inpatient Glycemic Control (2015)  Target Ranges:  Prepandial:   less than 140 mg/dL      Peak postprandial:   less than 180 mg/dL (1-2 hours)      Critically ill patients:  140 - 180 mg/dL   Lab Results  Component Value Date   GLUCAP 195 (H) 03/06/2016   HGBA1C 11.8 (H) 02/08/2016    Review of Glycemic Control  Results for [name], [name] (MRN [id number]) as of 03/06/2016 11:44  Ref. Range 03/04/2016 20:41 03/05/2016 06:21 03/05/2016 16:32 03/05/2016 20:16 03/06/2016 06:40  Glucose-Capillary Latest Ref Range: 65 - 99 mg/dL 782225 (H) 956201 (H) 213217 (H) 324 (H) 195 (H)   Diabetes history: DM2 Outpatient Diabetes medications: Levemir 40 units QHS, Novolog 8 units TID with meals, Metformin 1000 mg BID, Glipizide 10 mg daily Current orders for Inpatient glycemic control: Levemir 25 units BID, Novolog 0-15 units TID with meals, Novolog 0-5 units QHS, Glipizide 10 mg BID, Metformin 1000 mg BID  Inpatient Diabetes Program Recommendations: Please consider increasing Levemir to 27 units BID. Post prandial glucose is consistently elevated. Please consider ordering Novolog 5 units TID with meals for meal coverage (in addition to Novolog correction scale).  Spoke to RN in the unit- will discuss with Dr. Lovie CholPucilowska  Mikenna Bunkley, RN, BA, MHA, CDE Diabetes Coordinator Inpatient Diabetes Program  [phone] (Team Pager) [phone] [facility] Office) 03/06/2016 11:48 AM

## 2016-03-06 NOTE — BHH Group Notes (Signed)
[facility] Group Notes:  (Nursing/MHT/Case Management/Adjunct)  Date:  03/06/2016  Time:  4:39 AM  Type of Therapy:  Group Therapy  Participation Level:  Did Not Attend  Xylia Scherger Imani Ellora Varnum 03/06/2016, 4:39 AM 

## 2016-03-06 NOTE — [facility]HH Group Notes (Signed)
Goals Group  Date/Time:  Type of Therapy and Topic: Group Therapy: Goals Group: SMART Goals  ?  Participation Level: Moderate  ?  Description of Group:  ?  The purpose of a daily goals group is to assist and guide patients in setting recovery/wellness-related goals. The objective is to set goals as they relate to the crisis in which they were admitted. Patients will be using SMART goal modalities to set measurable goals. Characteristics of realistic goals will be discussed and patients will be assisted in setting and processing how one will reach their goal. Facilitator will also assist patients in applying interventions and coping skills learned in psycho-education groups to the SMART goal and process how one will achieve defined goal.  ?  Therapeutic Goals:  ?  -Patients will develop and document one goal related to or their crisis in which brought them into treatment.  -Patients will be guided by LCSW using SMART goal setting modality in how to set a measurable, attainable, realistic and time sensitive goal.  -Patients will process barriers in reaching goal.  -Patients will process interventions in how to overcome and successful in reaching goal.  ?  Patient's Goal: Pt was invited but did not attend. ?  Therapeutic Modalities:  Motivational Interviewing  Cognitive Behavioral Therapy  Crisis Intervention Model  SMART goals setting  Hampton AbbotKadijah Colbey Wirtanen, MSW, LCSW-A 03/06/16  10:53AM

## 2016-03-06 NOTE — Progress Notes (Signed)
Recreation Therapy Notes  Date: 08.10.17 Time: 1:15 pm  Location: Craft Room  Group Topic: Leisure Education  Goal Area(s) Addresses:  Patient will identify activities for each letter of the alphabet. Patient will verbalize ability to integrate positive leisure into life post d/c. Patient will verbalize ability to use leisure as a Associate Professorcoping skill.  Behavioral Response: Attentive, Interactive  Intervention: Leisure Alphabet  Activity: Patients were given a Leisure Information systems managerAlphabet worksheet and instructed to write healthy leisure activities for each letter of the alphabet.  Education: LRT educated patients on what they need to participate in leisure.  Education Outcome: In group clarification offered   Clinical Observations/Feedback: Patient completed activity by writing leisure activities. Patient contributed to group discussion by stating some leisure activities.  Jacquelynn CreeGreene,Johathon Overturf M, LRT/CTRS 03/06/2016 3:43 PM

## 2016-03-06 NOTE — Progress Notes (Signed)
[facility] MD Progress Note  03/06/2016 4:58 PM Ahmaad Neidhardt  MRN:  [id number]  Subjective:  Mr. Debruyne denies any symptoms of depression, anxiety, or psychosis. He is no longer suicidal or homicidal. He tolerates medications well. Discharge tomorrow.   Principal Problem: Schizoaffective disorder, bipolar type ([place]) Diagnosis:   Patient Active Problem List   Diagnosis Date Noted  . DKA (diabetic ketoacidoses) ([place]) [E13.10] 02/28/2016  . Chest pain [R07.9] 02/28/2016  . AKI (acute kidney injury) ([place]) [N17.9] 02/28/2016  . Suicidal ideation [R45.851] 02/06/2016  . Cannabis use disorder, moderate, dependence ([place]) [F12.20] 02/06/2016  . Schizoaffective disorder, bipolar type ([place]) [F25.0] 02/05/2016  . Hyperlipidemia [E78.5] 01/18/2015  . Hyperprolactinemia ([place]) [E22.1] 01/18/2015  . Diabetes mellitus ([place]) [E11.9] 03/11/2012  . HTN (hypertension) [I10] 03/11/2012   Total Time spent with patient: 20 minutes  Past Psychiatric History: depression, substance use.  Past Medical History:  Past Medical History:  Diagnosis Date  . Anxiety   . Bipolar affective disorder ([place])   . Bipolar disorder ([place]) 03/11/2012  . Depression   . Diabetes mellitus   . Diabetes mellitus ([place]) 03/11/2012  . Hepatitis 06/30/2013   Type C  . Hypertension   . Schizophrenia, schizo-affective ([place])     Past Surgical History:  Procedure Laterality Date  . FINGER SURGERY    . WISDOM TOOTH EXTRACTION     Family History:  Family History  Problem Relation Age of Onset  . Diabetes Mother   . Bipolar disorder Sister    Family Psychiatric  History: see H&P. Social History:  History  Alcohol Use No     History  Drug Use  . Types: Marijuana, Cocaine    Comment: 4 days ago last THC and cocoaine use... relapsed after 2 yrs     Social History   Social History  . Marital status: Single    Spouse name: N/A  . Number of children: N/A  . Years of education: N/A   Social History Main Topics  . Smoking  status: Never Smoker  . Smokeless tobacco: Never Used  . Alcohol use No  . Drug use:     Types: Marijuana, Cocaine     Comment: 4 days ago last THC and cocoaine use... relapsed after 2 yrs   . Sexual activity: Yes    Birth control/ protection: Condom     Comment: marijuana 25 days ago   Other Topics Concern  . None   Social History Narrative  . None   Additional Social History:                         Sleep: Fair  Appetite:  Fair  Current Medications: Current Facility-Administered Medications  Medication Dose Route Frequency Provider Last Rate Last Dose  . acetaminophen (TYLENOL) tablet 650 mg  650 mg Oral Q6H PRN Jolanta B Pucilowska, MD      . alum & mag hydroxide-simeth (MAALOX/MYLANTA) 200-200-20 MG/5ML suspension 30 mL  30 mL Oral Q4H PRN Jolanta B Pucilowska, MD      . glipiZIDE (GLUCOTROL) tablet 10 mg  10 mg Oral BID AC Jolanta B Pucilowska, MD   10 mg at 03/06/16 1651  . ibuprofen (ADVIL,MOTRIN) tablet 600 mg  600 mg Oral Q6H PRN Clovis Fredrickson, MD   600 mg at 03/04/16 0821  . insulin aspart (novoLOG) injection 0-15 Units  0-15 Units Subcutaneous TID WC Clovis Fredrickson, MD   2 Units at 03/06/16 1652  . insulin aspart (  novoLOG) injection 0-5 Units  0-5 Units Subcutaneous QHS Clovis Fredrickson, MD   4 Units at 03/05/16 2019  . insulin detemir (LEVEMIR) injection 25 Units  25 Units Subcutaneous BID Clovis Fredrickson, MD   25 Units at 03/06/16 0845  . magnesium hydroxide (MILK OF MAGNESIA) suspension 30 mL  30 mL Oral Daily PRN Jolanta B Pucilowska, MD      . metFORMIN (GLUCOPHAGE) tablet 1,000 mg  1,000 mg Oral BID WC Jolanta B Pucilowska, MD   1,000 mg at 03/06/16 1651  . nitroGLYCERIN (NITROSTAT) SL tablet 0.4 mg  0.4 mg Sublingual Q5 min PRN Jolanta B Pucilowska, MD      . simvastatin (ZOCOR) tablet 40 mg  40 mg Oral q1800 Jolanta B Pucilowska, MD   40 mg at 03/06/16 1651  . tuberculin injection 5 Units  5 Units Intradermal Once Clovis Fredrickson, MD   5 Units at 03/05/16 1638  . venlafaxine XR (EFFEXOR-XR) 24 hr capsule 150 mg  150 mg Oral Q breakfast Jolanta B Pucilowska, MD   150 mg at 03/06/16 0844  . ziprasidone (GEODON) capsule 40 mg  40 mg Oral BID WC Jolanta B Pucilowska, MD   40 mg at 03/06/16 1651  . zolpidem (AMBIEN) tablet 5 mg  5 mg Oral QHS PRN Clovis Fredrickson, MD        Lab Results:  Results for orders placed or performed during the hospital encounter of 03/03/16 (from the past 48 hour(s))  Glucose, capillary     Status: Abnormal   Collection Time: 03/04/16  8:41 PM  Result Value Ref Range   Glucose-Capillary 225 (H) 65 - 99 mg/dL   Comment 1 Notify RN   Glucose, capillary     Status: Abnormal   Collection Time: 03/05/16  6:21 AM  Result Value Ref Range   Glucose-Capillary 201 (H) 65 - 99 mg/dL  Glucose, capillary     Status: Abnormal   Collection Time: 03/05/16  4:32 PM  Result Value Ref Range   Glucose-Capillary 217 (H) 65 - 99 mg/dL   Comment 1 Notify RN    Comment 2 Document in Chart   Glucose, capillary     Status: Abnormal   Collection Time: 03/05/16  8:16 PM  Result Value Ref Range   Glucose-Capillary 324 (H) 65 - 99 mg/dL  Glucose, capillary     Status: Abnormal   Collection Time: 03/06/16  6:40 AM  Result Value Ref Range   Glucose-Capillary 195 (H) 65 - 99 mg/dL  Creatinine, serum     Status: None   Collection Time: 03/06/16  6:43 AM  Result Value Ref Range   Creatinine, Ser 0.82 0.61 - 1.24 mg/dL   GFR calc non Af Amer >60 >60 mL/min   GFR calc Af Amer >60 >60 mL/min    Comment: (NOTE) The eGFR has been calculated using the CKD EPI equation. This calculation has not been validated in all clinical situations. eGFR's persistently <60 mL/min signify possible Chronic Kidney Disease.   Glucose, capillary     Status: Abnormal   Collection Time: 03/06/16 12:40 PM  Result Value Ref Range   Glucose-Capillary 194 (H) 65 - 99 mg/dL   Comment 1 Notify RN   Glucose, capillary      Status: Abnormal   Collection Time: 03/06/16  4:47 PM  Result Value Ref Range   Glucose-Capillary 133 (H) 65 - 99 mg/dL    Blood Alcohol level:  Lab Results  Component Value Date  ETH <5 02/04/2016   ETH <5 27/78/2423    Metabolic Disorder Labs: Lab Results  Component Value Date   HGBA1C 11.8 (H) 02/08/2016   MPG 275 08/03/2015   MPG 295 01/17/2015   Lab Results  Component Value Date   PROLACTIN 3.4 (L) 02/08/2016   PROLACTIN 26.5 (H) 01/17/2015   Lab Results  Component Value Date   CHOL 213 (H) 02/08/2016   TRIG 127 02/08/2016   HDL 42 02/08/2016   CHOLHDL 5.1 02/08/2016   VLDL 25 02/08/2016   LDLCALC 146 (H) 02/08/2016   LDLCALC 183 (H) 08/03/2015    Physical Findings: AIMS: Facial and Oral Movements Muscles of Facial Expression: None, normal Lips and Perioral Area: None, normal Jaw: None, normal Tongue: None, normal,Extremity Movements Upper (arms, wrists, hands, fingers): None, normal Lower (legs, knees, ankles, toes): None, normal, Trunk Movements Neck, shoulders, hips: None, normal, Overall Severity Severity of abnormal movements (highest score from questions above): None, normal Incapacitation due to abnormal movements: None, normal Patient's awareness of abnormal movements (rate only patient's report): No Awareness, Dental Status Current problems with teeth and/or dentures?: No Does patient usually wear dentures?: No  CIWA:    COWS:     Musculoskeletal: Strength & Muscle Tone: within normal limits Gait & Station: normal Patient leans: N/A  Psychiatric Specialty Exam: Physical Exam  Nursing note and vitals reviewed.   Review of Systems  Psychiatric/Behavioral: Positive for depression, substance abuse and suicidal ideas.  All other systems reviewed and are negative.   Blood pressure 124/83, pulse (!) 58, temperature 98 F (36.7 C), temperature source Oral, resp. rate 20, height _0  (1.753 m), weight 99.8 kg (220 lb), SpO2 100 %.Body mass  index is 32.49 kg/m.  General Appearance: Casual  Eye Contact:  Good  Speech:  Clear and Coherent  Volume:  Normal  Mood:  Anxious  Affect:  Congruent  Thought Process:  Goal Directed  Orientation:  Full (Time, Place, and Person)  Thought Content:  WDL  Suicidal Thoughts:  No  Homicidal Thoughts:  No  Memory:  Immediate;   Fair Recent;   Fair Remote;   Fair  Judgement:  Poor  Insight:  Lacking  Psychomotor Activity:  Normal  Concentration:  Concentration: Fair and Attention Span: Fair  Recall:  [employer] of Knowledge:  Fair  Language:  Fair  Akathisia:  No  Handed:  Right  AIMS (if indicated):     Assets:  Communication Skills Desire for Improvement Financial Resources/Insurance Resilience Social Support  ADL's:  Intact  Cognition:  WNL  Sleep:  Number of Hours: 7.45     Treatment Plan Summary: Daily contact with patient to assess and evaluate symptoms and progress in treatment and Medication management   Mr. Ballentine is a 55 year old male with history of depression, mood instability, and substance abuse who was transferred to psychiatry from medical floor where he was hospitalized for DKA for suicidal ideation the context of treatment noncompliance and severe social stressors.  1. Suicidal ideation. The patient is able to contract for safety in the hospital.   2. Mood. We restarted Effexor for depression and Geodon for mood stabiization and psychosis.  3. Diabetes. He is on glipizide, metformin, insulin, ADA diet, blood glucose monitoring and sliding insulin.   4. Dyslipidemia. He is on Zocor.  5. Insomnia. He is on Ambien.  6. Substance abuse treatment. He is positive for cocaine and cannabinoids. He desires residential treatment.  7. Metabolic syndrome screening. Lab performed during previous  admission. Lipid panel shows elevated cholesterol. TSH is normal. Hemoglobin A1c 11.8, prolactin 3.4.   8. Disposition. The patient will be discharged to his new  group home tomorrow. He will follow [facility] with [facility].       Orson Slick, MD 03/06/2016, 4:58 PM

## 2016-03-06 NOTE — [facility] Group Notes (Signed)
[facility] LCSW Group Therapy  03/06/2016 10:45 AM  Type of Therapy:  Group Therapy  Participation Level:  Minimal  Participation Quality:  Attentive  Affect:  Appropriate  Cognitive:  Alert  Insight:  Improving  Engagement in Therapy:  Limited  Modes of Intervention:  Activity, Discussion, Education and Support  Summary of Progress/Problems: Balance in life: Patients will discuss the concept of balance and how it looks and feels to be unbalanced. Pt will identify areas in their life that is unbalanced and ways to become more balanced. Pt arrived to group late. Pt minimally engaged by would participate when prompted by CSW. Pt states he wants to maintain sobriety to improve balance in his life.   Aramis Weil G. Garnette CzechSampson MSW, LCSWA 03/06/2016, 10:47 AM

## 2016-03-06 NOTE — Plan of Care (Signed)
Problem: Coping: Goal: Ability to verbalize frustrations and anger appropriately will improve Outcome: Progressing Patient able to verbalize frustrations at this time [place] RN   

## 2016-03-06 NOTE — Progress Notes (Signed)
D: Patient is alert and oriented on the unit this shift. Patient  Not attended and actively participated in groups today. Patient denies suicidal ideation, homicidal ideation, auditory or visual hallucinations at the present time.  A: Scheduled medications are administered to patient as per MD orders. Emotional support and encouragement are provided. Patient is maintained on q.15 minute safety checks. Patient is informed to notify staff with questions or concerns. R: No adverse medication reactions are noted. Patient is cooperative with medication administration . Patient is receptive and cooperative on the unit at this time. Patient interacts isolative on the unit this shift. Patient contracts for safety at this time. Patient remains safe at this time.

## 2016-03-07 LAB — GLUCOSE, CAPILLARY
GLUCOSE-CAPILLARY: 215 mg/dL — AB (ref 65–99)
Glucose-Capillary: 163 mg/dL — ABNORMAL HIGH (ref 65–99)

## 2016-03-07 NOTE — Progress Notes (Signed)
Patient denies SI/HI, denies A/V hallucinations. Patient verbalizes understanding of discharge instructions, follow up care and prescriptions. Patient [facility] all belongings from  [facility]Patient was unhappily waiting for the group home staff since they were an hour 30mts late.  Patient escorted out by staff, transported by group home staff.

## 2016-03-07 NOTE — Plan of Care (Signed)
Problem: Safety: Goal: Periods of time without injury will increase Outcome: Progressing Patient has been without injury since admission.

## 2016-03-07 NOTE — Progress Notes (Signed)
Recreation Therapy Notes  Date: 08.11.17 Time: 1:00 pm Location: Craft Room  Group Topic: Coping Skills  Goal Area(s) Addresses:  Patient will participate in healthy coping skill. Patient will verbalize benefit of using art as a coping skill.  Behavioral Response: Attentive, Left early  Intervention: Coloring  Activity: Patients were given coloring sheets to color and were instructed to think about the emotions they were feeling and what their minds were focused on.  Education: LRT educated patients on healthy coping skills.  Education Outcome: Patient left before LRT educated group.  Clinical Observations/Feedback: Patient did not participate in group activity. Patient left group at approximately 1:30 pm and did not return to group.  Jacquelynn CreeGreene,Despina Boan M, LRT/CTRS 03/07/2016 2:34 PM

## 2016-03-07 NOTE — [facility] Suicide Risk Assessment (Signed)
[facility]   Principal Problem: Schizoaffective disorder, bipolar type [facility](HCC) Discharge Diagnoses:  Patient Active Problem List   Diagnosis Date Noted  . DKA (diabetic ketoacidoses) ([facility]) [E13.10] 02/28/2016  . Chest pain [R07.9] 02/28/2016  . AKI (acute kidney injury) ([facility]) [N17.9] 02/28/2016  . Suicidal ideation [R45.851] 02/06/2016  . Cannabis use disorder, moderate, dependence ([facility]) [F12.20] 02/06/2016  . Schizoaffective disorder, bipolar type ([facility]) [F25.0] 02/05/2016  . Hyperlipidemia [E78.5] 01/18/2015  . Hyperprolactinemia ([facility]) [E22.1] 01/18/2015  . Diabetes mellitus ([facility]) [E11.9] 03/11/2012  . HTN (hypertension) [I10] 03/11/2012    Total Time spent with patient: 30 minutes  Musculoskeletal: Strength & Muscle Tone: within normal limits Gait & Station: normal Patient leans: N/A  Psychiatric Specialty Exam: Review of Systems  All other systems reviewed and are negative.   Blood pressure 138/84, pulse (!) 52, temperature 98 F (36.7 C), temperature source Oral, resp. rate 20, height 5\' 9"  (1.753 m), weight 99.8 kg (220 lb), SpO2 100 %.Body mass index is 32.49 kg/m.  General Appearance: Casual  Eye Contact::  Good  Speech:  Clear and Coherent409  Volume:  Normal  Mood:  Euthymic  Affect:  Appropriate  Thought Process:  Goal Directed  Orientation:  Full (Time, Place, and Person)  Thought Content:  WDL  Suicidal Thoughts:  No  Homicidal Thoughts:  No  Memory:  Immediate;   Fair Recent;   Fair Remote;   Fair  Judgement:  Impaired  Insight:  Shallow  Psychomotor Activity:  Normal  Concentration:  Fair  Recall:  [employer]  Fund of Knowledge:Fair  Language: Fair  Akathisia:  No  Handed:  Right  AIMS (if indicated):     Assets:  Communication Skills Desire for Improvement Financial Resources/Insurance Housing Resilience Social Support  Sleep:  Number of Hours: 8  Cognition: WNL  ADL's:  Intact   Mental Status Per Nursing  Assessment::   On Admission:     Demographic Factors:  Male  Loss Factors: Decline in physical health  Historical Factors: Prior suicide attempts and Impulsivity  Risk Reduction Factors:   Sense of responsibility to family and Positive social support  Continued Clinical Symptoms:  Bipolar Disorder:   Depressive phase Alcohol/Substance Abuse/Dependencies Unstable or Poor Therapeutic Relationship Previous Psychiatric Diagnoses and Treatments Medical Diagnoses and Treatments/Surgeries  Cognitive Features That Contribute To Risk:  None    Suicide Risk:  Minimal: No identifiable suicidal ideation.  Patients presenting with no risk factors but with morbid ruminations; may be classified as minimal risk based on the severity of the depressive symptoms  Follow-up Information    [facility]. Go on 03/10/2016.   Why:  [facility]. You should arrive at [facility]8AM for your intake assessment and medication management appointment. If you have questions or concerns, please contact [facility]. Contact information: [address], [place], [place] [address] Phone: [phone]          Plan Of Care/Follow-up recommendations:  Activity:  As tolerated. Diet:  Low sodium heart healthy ADA diet. Other:  Keep follow-up appointments.  Kristine LineaJolanta Pucilowska, MD 03/07/2016, 8:43 AM

## 2016-03-07 NOTE — [facility] Group Notes (Signed)
[facility] Group Notes:  (Nursing/MHT/Case Management/Adjunct)  Date:  03/07/2016  Time:  3:57 PM  Type of Therapy:  Psychoeducational Skills  Participation Level:  Did Not Attend  [name] [facility] Hospital[facility] 03/07/2016, 3:57 PM

## 2016-03-07 NOTE — Progress Notes (Signed)
D: Patient appears very flat. [facility] Denies SI/HI/AVH. CBG HS 269. Patient complaining of dizziness and HA. Vitals obtained. BP 151/83. A: Medication given with education. Encouragement provided. 3 units of novolog given. Patient offered PRNs for headache but refused.  R: Patient was compliant with medication. He has remained calm and cooperative. Safety maintained with 15 min checks.

## 2016-03-07 NOTE — Progress Notes (Signed)
  [facility] :  Will you be returning to the same living situation after discharge:  No. - group [facility] - [facility].  At discharge, do you have transportation home?: Yes,  group home will pick pt up. Do you have the ability to pay for your medications: Yes,  Medicare and Medicaid coverage  Release of information consent forms completed and in the chart;  Patient's signature needed at discharge.  Patient to Follow up at: Follow-up Information    [facility]. Go on 03/10/2016.   Why:  [facility]. You should arrive at [facility]8AM for your intake assessment and medication management appointment. If you have questions or concerns, please contact [facility]. Contact information: [address], [place], [place] [address] Phone: [phone]          Next level of care provider has access to [facility] Link:no  Safety Planning and Suicide Prevention discussed: Yes,  SPE completed with pt and mother  Have you used any form of tobacco in the last 30 days? (Cigarettes, Smokeless Tobacco, Cigars, and/or Pipes): No  Has patient been referred to the Quitline?: Patient refused referral  Patient has been referred for addiction treatment: Yes - [facility] follow-up.  [name], MSW, LCSW-A 03/07/2016, 12:20 PM

## 2016-03-07 NOTE — [facility] Group Notes (Signed)
[facility] Group Notes:  (Nursing/MHT/Case Management/Adjunct)  Date:  03/07/2016  Time:  4:02 AM  Type of Therapy:  Psychoeducational Skills  Participation Level:  Active  Participation Quality:  Appropriate  Affect:  Appropriate  Cognitive:  Appropriate  Insight:  Appropriate  Engagement in Group:  Engaged  Modes of Intervention:  Discussion, Socialization and Support  Summary of Progress/Problems:  Chancy MilroyLaquanda Y Dsean Vantol 03/07/2016, 4:02 AM

## 2016-03-07 NOTE — [facility] Group Notes (Signed)
[facility] LCSW Group Therapy  03/07/2016 3:02 PM  Type of Therapy:  Group Therapy  Participation Level:  Pt did not attend group. CSW invited pt to group.   Summary of Progress/Problems: Feelings around Relapse. Group members discussed the meaning of relapse and shared personal stories of relapse, how it affected them and others, and how they perceived themselves during this time. Group members were encouraged to identify triggers, warning signs and coping skills used when facing the possibility of relapse. Social supports were discussed and explored in detail.    Vane Yapp G. Ulani Degrasse MSW, LCSWA 03/07/2016, 3:02 PM  

## 2016-03-07 NOTE — Discharge Summary (Signed)
Physician Discharge Summary Note  Patient:  [name] is an 55 y.o., male MRN:  [id number] DOB:  1961-05-01 Patient phone:  [phone] (home)  Patient address:   [address] [place] [address],  Total Time spent with patient: 30 minutes  Date of Admission:  03/03/2016 Date of Discharge: 03/07/2016  Reason for Admission:  Suicidal ideation.  Identifying data. [name] is a 55 year old male with history of schizoaffective disorder and substance use.  Chief complaint. "I'm suicidal."  History of present illness. Information was obtained from the patient and the chart. The patient has a long history of depression, mood instability and substance use with multiple psychiatric admissions and medication trials. During his last hospitalization Zoloft and Abilify was switched to Effexor and Geodon however the patient did not take it consistently. He was only inconsistently and his insulin and was hospitalized on the medical floor prior to admission to psychiatry. The patient became increasingly depressed with poor sleep, decreased appetite, anhedonia, feeling of guilt of energy and concentration, crying spells and social isolation. He started experiencing suicidal ideation. He did not attempt suicide this time. He denies psychotic symptoms. He reports panic attacks, social anxiety and PTSD type of symptoms. There is no OCD. He denies alcohol use but admits to smoking crack and marijuana.  Past psychiatric history. There were multiple psychiatric hospitalization he claims for both depression and mania. He's been tried on numerous medications. He remembers lithium, Depakote, Tegretol, Seroquel and Risperdal. Last time he reported two suicide attempts by cutting and overdose. Today he threw another one by shooting. He attempted substance abuse treatment several times. His last treatment was in [place] where he stayed in the program for 4 months until January 2017.  Family psychiatric  history. Sister with schizophrenia.  Social history. He is disabled from mental illness. He is originally from [place]. He used to live with his friend but is no longer possible that he is homeless now. The patient again asks to be placed in a group home but last admission he change his mind at the last moment.  Principal Problem: Schizoaffective disorder, bipolar type [facility]) Discharge Diagnoses: Patient Active Problem List   Diagnosis Date Noted  . DKA (diabetic ketoacidoses) (HCC) [E13.10] 02/28/2016  . Chest pain [R07.9] 02/28/2016  . AKI (acute kidney injury) (HCC) [N17.9] 02/28/2016  . Suicidal ideation [R45.851] 02/06/2016  . Cannabis use disorder, moderate, dependence (HCC) [F12.20] 02/06/2016  . Schizoaffective disorder, bipolar type (HCC) [F25.0] 02/05/2016  . Hyperlipidemia [E78.5] 01/18/2015  . Hyperprolactinemia (HCC) [E22.1] 01/18/2015  . Diabetes mellitus (HCC) [E11.9] 03/11/2012  . HTN (hypertension) [I10] 03/11/2012   Past Medical History:  Past Medical History:  Diagnosis Date  . Anxiety   . Bipolar affective disorder (HCC)   . Bipolar disorder (HCC) 03/11/2012  . Depression   . Diabetes mellitus   . Diabetes mellitus (HCC) 03/11/2012  . Hepatitis 06/30/2013   Type C  . Hypertension   . Schizophrenia, schizo-affective (HCC)     Past Surgical History:  Procedure Laterality Date  . FINGER SURGERY    . WISDOM TOOTH EXTRACTION     Family History:  Family History  Problem Relation Age of Onset  . Diabetes Mother   . Bipolar disorder Sister     Social History:  History  Alcohol Use No     History  Drug Use  . Types: Marijuana, Cocaine    Comment: 4 days ago last THC and cocoaine use... relapsed after 2 yrs  Social History   Social History  . Marital status: Single    Spouse name: N/A  . Number of children: N/A  . Years of education: N/A   Social History Main Topics  . Smoking status: Never Smoker  . Smokeless tobacco: Never Used  . Alcohol  use No  . Drug use:     Types: Marijuana, Cocaine     Comment: 4 days ago last THC and cocoaine use... relapsed after 2 yrs   . Sexual activity: Yes    Birth control/ protection: Condom     Comment: marijuana 25 days ago   Other Topics Concern  . None   Social History Narrative  . None    Hospital Course:    [name] is a 55 year old male with history of depression, mood instability, and substance abuse who was transferred to psychiatry from medical floor, where he was hospitalized for DKA, for suicidal ideation the context of treatment noncompliance, substance use and severe social stressors.  1. Suicidal ideation. This has resolved. The patient is able to contract for safety. He is forward thinking and optimistic about that future.    2. Mood. We restarted Effexor for depression and Geodon for mood stabiization and psychosis.  3. Diabetes. He is on glipizide, metformin, insulin, ADA diet, blood glucose monitoring and sliding insulin. Diabetes nurse coordinator input is greatly appreciated.  4. Dyslipidemia. He is on Zocor.  5. Insomnia. He is on Ambien.  6. Substance abuse treatment. He is positive for cocaine and cannabinoids. He desires treatment.  7. Metabolic syndrome screening. Lab performed during previous admission. Lipid panel shows elevated cholesterol. TSH is normal. Hemoglobin A1c 11.8, prolactin 3.4.   8. Disposition. The patient was discharged to a new group home tomorrow. He will follow up with [facility].    Physical Findings: AIMS: Facial and Oral Movements Muscles of Facial Expression: None, normal Lips and Perioral Area: None, normal Jaw: None, normal Tongue: None, normal,Extremity Movements Upper (arms, wrists, hands, fingers): None, normal Lower (legs, knees, ankles, toes): None, normal, Trunk Movements Neck, shoulders, hips: None, normal, Overall Severity Severity of abnormal movements (highest score from questions above): None,  normal Incapacitation due to abnormal movements: None, normal Patient's awareness of abnormal movements (rate only patient's report): No Awareness, Dental Status Current problems with teeth and/or dentures?: No Does patient usually wear dentures?: No  CIWA:    COWS:     Musculoskeletal: Strength & Muscle Tone: within normal limits Gait & Station: normal Patient leans: N/A  Psychiatric Specialty Exam: Physical Exam  Nursing note and vitals reviewed.   Review of Systems  Psychiatric/Behavioral: Positive for substance abuse.  All other systems reviewed and are negative.   Blood pressure 138/84, pulse (!) 52, temperature 98 F (36.7 C), temperature source Oral, resp. rate 20, height 5\' 9"  (1.753 m), weight 99.8 kg (220 lb), SpO2 100 %.Body mass index is 32.49 kg/m.  See SRA.                                                  Sleep:  Number of Hours: 8     Have you used any form of tobacco in the last 30 days? (Cigarettes, Smokeless Tobacco, Cigars, and/or Pipes): No  Has this patient used any form of tobacco in the last 30 days? (Cigarettes, Smokeless Tobacco, Cigars, and/or Pipes)  Yes, No  Blood Alcohol level:  Lab Results  Component Value Date   ETH <5 02/04/2016   ETH <5 08/01/2015    Metabolic Disorder Labs:  Lab Results  Component Value Date   HGBA1C 11.8 (H) 02/08/2016   MPG 275 08/03/2015   MPG 295 01/17/2015   Lab Results  Component Value Date   PROLACTIN 3.4 (L) 02/08/2016   PROLACTIN 26.5 (H) 01/17/2015   Lab Results  Component Value Date   CHOL 213 (H) 02/08/2016   TRIG 127 02/08/2016   HDL 42 02/08/2016   CHOLHDL 5.1 02/08/2016   VLDL 25 02/08/2016   LDLCALC 146 (H) 02/08/2016   LDLCALC 183 (H) 08/03/2015    See Psychiatric Specialty Exam and Suicide Risk Assessment completed by Attending Physician prior to discharge.  Discharge destination:  Home  Is patient on multiple antipsychotic therapies at discharge:  No   Has  Patient had three or more failed trials of antipsychotic monotherapy by history:  No  Recommended Plan for Multiple Antipsychotic Therapies: NA  Discharge Instructions    Diet - low sodium heart healthy    Complete by:  As directed   Increase activity slowly    Complete by:  As directed       Medication List    STOP taking these medications   HUMALOG KWIKPEN 100 UNIT/ML KiwkPen Generic drug:  insulin lispro     TAKE these medications     Indication  glipiZIDE 10 MG tablet Commonly known as:  GLUCOTROL Take 1 tablet (10 mg total) by mouth 2 (two) times daily before a meal. What changed:  additional instructions  Indication:  Type 2 Diabetes   insulin aspart 100 UNIT/ML injection Commonly known as:  novoLOG Inject 0-15 Units into the skin 3 (three) times daily with meals. What changed:  how much to take  Indication:  Type 2 Diabetes   insulin detemir 100 UNIT/ML injection Commonly known as:  LEVEMIR Inject 0.27 mLs (27 Units total) into the skin 2 (two) times daily. What changed:  how much to take  when to take this  Indication:  Type 2 Diabetes   metFORMIN 1000 MG tablet Commonly known as:  GLUCOPHAGE Take 1 tablet (1,000 mg total) by mouth 2 (two) times daily with a meal. For diabetes management What changed:  Another medication with the same name was removed. Continue taking this medication, and follow the directions you see here.  Indication:  Type 2 Diabetes   nitroGLYCERIN 0.4 MG SL tablet Commonly known as:  NITROSTAT Place 1 tablet (0.4 mg total) under the tongue every 5 (five) minutes as needed for chest pain (CP or SOB).  Indication:  Acute Angina Pectoris   simvastatin 40 MG tablet Commonly known as:  ZOCOR Take 1 tablet (40 mg total) by mouth daily at 6 PM.  Indication:  Cerebrovascular Accident or Stroke, hyperlipidemia   venlafaxine XR 150 MG 24 hr capsule Commonly known as:  EFFEXOR-XR Take 1 capsule (150 mg total) by mouth daily with  breakfast.  Indication:  Major Depressive Disorder   ziprasidone 40 MG capsule Commonly known as:  GEODON Take 1 capsule (40 mg total) by mouth 2 (two) times daily with a meal.  Indication:  Manic-Depression   zolpidem 5 MG tablet Commonly known as:  AMBIEN Take 1 tablet (5 mg total) by mouth at bedtime as needed for sleep.  Indication:  Trouble Sleeping      [facility]up [employer]    [facility]. Go on 03/10/2016.  Why:  Please [facility] to [facility] for your hospital follow-up appointment. You should arrive at [facility] for your intake assessment and medication management appointment. If you have questions or concerns, please contact [facility]. Contact information: [address], [place], [place] [address] Phone: [phone]          Follow-up recommendations:  Activity:  as tolerated. Diet:  low sodium heart healthy ADA diet. Other:  keep follow up appointments.  Comments:    Signed: Kristine Linea, MD 03/07/2016, 8:43 AM

## 2016-03-07 NOTE — Tx Team (Signed)
Interdisciplinary Treatment Plan Update (Adult)         Date: 03/07/2016   Time Reviewed: 10:30 AM   Progress in Treatment: Improving Attending groups: Yes  Participating in groups: Yes  Taking medication as prescribed: Yes  Tolerating medication: Yes  Family/Significant other contact made: Yes, CSW has spoken with mother, Wells Guiles. Patient understands diagnosis: Yes  Discussing patient identified problems/goals with staff: Yes  Medical problems stabilized or resolved: Yes  Denies suicidal/homicidal ideation: Yes  Issues/concerns per patient self-inventory: Yes  Other:   New problem(s) identified: N/A   Discharge Plan or Barriers: see below   Reason for Continuation of Hospitalization:   Depression   Anxiety   Medication Stabilization   Comments: N/A   Estimated length of stay: 3-5 days    Patient is a 55 year old male admitted for major depression. Patient lives in [place], [place].   Patient will benefit from crisis stabilization, medication evaluation, group therapy, and psycho education in addition to case management for discharge planning. Patient and CSW reviewed pt's identified goals and treatment plan. Pt verbalized understanding and agreed to treatment plan.    Review of initial/current patient goals per problem list:  1. Goal(s): Patient will participate in aftercare plan   Met: Yes  Target date: 3-5 days post admission date   As evidenced by: Patient will participate within aftercare plan AEB aftercare provider and housing plan at discharge being identified.   Pt will go to a group home, [facility]. Pt will follow-up with [facility] in [place].  2. Goal (s): Patient will exhibit decreased depressive symptoms and suicidal ideations.   Met: Yes  Target date: 3-5 days post admission date   As evidenced by: Patient will utilize self-rating of depression at 3 or below and demonstrate decreased signs of depression or be deemed stable  for discharge by MD.   Patient reports a depression score of 3 at this time. Pt denies SI/HI.   Adequate for discharge per MD.  3. Goal(s): Patient will demonstrate decreased signs and symptoms of anxiety.   Met: Yes  Target date: 3-5 days post admission date   As evidenced by: Patient will utilize self-rating of anxiety at 3 or below and demonstrated decreased signs of anxiety, or be deemed stable for discharge by MD   Pt reports an anxiety score of 2 at this time. Adequate for discharge per MD.    Attendees:  Patient: [name] Family:  Physician: Orson Slick, MD    03/07/2016 10:30AM  Nursing: Floyde Parkins, RN     03/07/2016 10:30AM  Clinical Social Worker: Glorious Peach                         03/04/2016  10:30AM Recreational Therapist: Everitt Amber                         03/04/2016  10:30 AM

## 2016-03-09 ENCOUNTER — Encounter (HOSPITAL_COMMUNITY): Payer: Self-pay

## 2016-03-09 ENCOUNTER — Emergency Department (HOSPITAL_COMMUNITY)
Admission: EM | Admit: 2016-03-09 | Discharge: 2016-03-09 | Disposition: A | Discharge status: Home / Self Care | Payer: Medicare Other | Attending: Emergency Medicine | Admitting: Emergency Medicine

## 2016-03-09 DIAGNOSIS — I10 Essential (primary) hypertension: Secondary | ICD-10-CM | POA: Diagnosis not present

## 2016-03-09 DIAGNOSIS — E1169 Type 2 diabetes mellitus with other specified complication: Secondary | ICD-10-CM | POA: Insufficient documentation

## 2016-03-09 DIAGNOSIS — Z794 Long term (current) use of insulin: Secondary | ICD-10-CM | POA: Diagnosis not present

## 2016-03-09 DIAGNOSIS — Z7984 Long term (current) use of oral hypoglycemic drugs: Secondary | ICD-10-CM | POA: Diagnosis not present

## 2016-03-09 DIAGNOSIS — Z760 Encounter for issue of repeat prescription: Secondary | ICD-10-CM | POA: Diagnosis present

## 2016-03-09 LAB — CBG MONITORING, ED: GLUCOSE-CAPILLARY: 279 mg/dL — AB (ref 65–99)

## 2016-03-09 MED ORDER — INSULIN ASPART 100 UNIT/ML ~~LOC~~ SOLN: 0.0000 [IU] | Freq: Three times a day (TID) | SUBCUTANEOUS | 11 refills | Status: DC

## 2016-03-09 MED ORDER — METFORMIN HCL 1000 MG PO TABS: 1000.0000 mg | ORAL_TABLET | Freq: Two times a day (BID) | ORAL | 1 refills | Status: DC

## 2016-03-09 MED ORDER — GLIPIZIDE 10 MG PO TABS: 10.0000 mg | ORAL_TABLET | Freq: Two times a day (BID) | ORAL | 1 refills | Status: DC

## 2016-03-09 MED ORDER — INSULIN DETEMIR 100 UNIT/ML ~~LOC~~ SOLN: 27.0000 [IU] | Freq: Two times a day (BID) | SUBCUTANEOUS | 11 refills | Status: DC

## 2016-03-09 MED ORDER — INSULIN SYRINGE-NEEDLE U-100 30G 0.5 ML MISC: 0 refills | Status: DC

## 2016-03-09 NOTE — ED Provider Notes (Signed)
[facility]-EMERGENCY DEPT Provider Note   First Provider Contact:   First MD Initiated Contact with Patient 03/09/16 1037      By signing my name below, I, Earmon PhoenixJennifer Waddell, attest that this documentation has been prepared under the direction and in the presence of BoeingChris Raylinn Kosar, PA-C. Electronically Signed: Earmon PhoenixJennifer Waddell, ED Scribe. 03/09/16. 10:42 AM.  History   Chief Complaint No chief complaint on file.  The history is provided by the patient and medical records. No language interpreter was used.    HPI Comments:  Andre LefortReginald Nieman is a 55 y.o. male with PMHx of DM who presents to the Emergency Department needing new prescriptions for his diabetic supplies and medications. Pt states he needs new prescriptions for his insulin, syringes and CBG machine and strips. He denies any symptoms or complaints. He denies fever, chills, nausea, vomiting, dizziness, CP, SOB.  Past Medical History:  Diagnosis Date  . Anxiety   . Bipolar affective disorder (HCC)   . Bipolar disorder (HCC) 03/11/2012  . Depression   . Diabetes mellitus   . Diabetes mellitus (HCC) 03/11/2012  . Hepatitis 06/30/2013   Type C  . Hypertension   . Schizophrenia, schizo-affective [facility](HCC)     Patient Active Problem List   Diagnosis Date Noted  . DKA (diabetic ketoacidoses) (HCC) 02/28/2016  . Chest pain 02/28/2016  . AKI (acute kidney injury) (HCC) 02/28/2016  . Suicidal ideation 02/06/2016  . Cannabis use disorder, moderate, dependence (HCC) 02/06/2016  . Schizoaffective disorder, bipolar type (HCC) 02/05/2016  . Hyperlipidemia 01/18/2015  . Hyperprolactinemia (HCC) 01/18/2015  . Diabetes mellitus (HCC) 03/11/2012  . HTN (hypertension) 03/11/2012    Past Surgical History:  Procedure Laterality Date  . FINGER SURGERY    . WISDOM TOOTH EXTRACTION         Home Medications    Prior to Admission medications   Medication Sig Start Date End Date Taking? Authorizing Provider  glipiZIDE (GLUCOTROL) 10 MG tablet  Take 1 tablet (10 mg total) by mouth 2 (two) times daily before a meal. 03/09/16   Charlestine Nighthristopher Hayven Croy, PA-C  insulin aspart (NOVOLOG) 100 UNIT/ML injection Inject 0-15 Units into the skin 3 (three) times daily with meals. 03/09/16   Dallas Scorsone, PA-C  insulin detemir (LEVEMIR) 100 UNIT/ML injection Inject 0.27 mLs (27 Units total) into the skin 2 (two) times daily. 03/09/16   Charlestine Nighthristopher Westlyn Glaza, PA-C  metFORMIN (GLUCOPHAGE) 1000 MG tablet Take 1 tablet (1,000 mg total) by mouth 2 (two) times daily with a meal. For diabetes management 03/09/16   Charlestine Nighthristopher Kayti Poss, PA-C  nitroGLYCERIN (NITROSTAT) 0.4 MG SL tablet Place 1 tablet (0.4 mg total) under the tongue every 5 (five) minutes as needed for chest pain (CP or SOB). 03/06/16   Shari ProwsJolanta B Pucilowska, MD  simvastatin (ZOCOR) 40 MG tablet Take 1 tablet (40 mg total) by mouth daily at 6 PM. 03/06/16   Jolanta B Pucilowska, MD  venlafaxine XR (EFFEXOR-XR) 150 MG 24 hr capsule Take 1 capsule (150 mg total) by mouth daily with breakfast. 03/06/16   Jolanta B Pucilowska, MD  ziprasidone (GEODON) 40 MG capsule Take 1 capsule (40 mg total) by mouth 2 (two) times daily with a meal. 03/06/16   Jolanta B Pucilowska, MD  zolpidem (AMBIEN) 5 MG tablet Take 1 tablet (5 mg total) by mouth at bedtime as needed for sleep. 03/06/16   Shari ProwsJolanta B Pucilowska, MD    Family History Family History  Problem Relation Age of Onset  . Diabetes Mother   . Bipolar  disorder Sister     Social History Social History  Substance Use Topics  . Smoking status: Never Smoker  . Smokeless tobacco: Never Used  . Alcohol use No     Allergies   Wellbutrin [bupropion]   Review of Systems Review of Systems A complete 10 system review of systems was obtained and all systems are negative except as noted in the HPI and PMH.   Physical Exam Updated Vital Signs Triage Vitals: BP 127/88 (BP Location: Left Arm)   Pulse 60   Temp 98.2 F (36.8 C) (Oral)   Resp 20   Wt 211 lb 3 oz  (95.8 kg)   SpO2 100%   BMI 31.19 kg/m   Physical Exam  Constitutional: He is oriented to person, place, and time. He appears well-developed and well-nourished.  HENT:  Head: Normocephalic and atraumatic.  Neck: Normal range of motion.  Cardiovascular: Normal rate.   Pulmonary/Chest: Effort normal.  Musculoskeletal: Normal range of motion.  Neurological: He is alert and oriented to person, place, and time.  Skin: Skin is warm and dry.  Psychiatric: He has a normal mood and affect. His behavior is normal.  Nursing note and vitals reviewed.    ED Treatments / Results  Labs (all labs ordered are listed, but only abnormal results are displayed) Labs Reviewed  CBG MONITORING, ED - Abnormal; Notable for the following:       Result Value   Glucose-Capillary 279 (*)    All other components within normal limits    EKG  EKG Interpretation None       Radiology No results found.  Procedures Procedures (including critical care time)  Medications Ordered in ED Medications - No data to display   Initial Impression / Assessment and Plan / ED Course  I have reviewed the triage vital signs and the nursing notes.  Pertinent labs & imaging results that were available during my care of the patient were reviewed by me and considered in my medical decision making (see chart for details).  Clinical Course  DIAGNOSTIC STUDIES: Oxygen Saturation is 100% on RA, normal by my interpretation.   COORDINATION OF CARE: 10:41 AM- Will provide prescriptions for diabetic medications and supplies. Pt verbalizes understanding and agrees to plan.  Medications - No data to display  I personally performed the services described in this documentation, which was scribed in my presence. The recorded information has been reviewed and is accurate.    Final Clinical Impressions(s) / ED Diagnoses   Final diagnoses:  Medication refill    New Prescriptions Current Discharge Medication List         Charlestine Night, PA-C 03/09/16 1518    Lorre Nick, MD 03/10/16 [phone]

## 2016-03-09 NOTE — ED Triage Notes (Signed)
Patient here requesting prescriptions only! Has lost diabetic supplies. Request RX for meter, strips, diabetic meds, NAD

## 2016-03-09 NOTE — Care Management Note (Signed)
Case Management Note  Patient Details  Name: Joseph LefortReginald Vasquez MRN: [id number] Date of Birth: 10/04/1960  Subjective/Objective: 55 y.o. M seen in the ED for Elevated blood glucose. CM consulted as pt reports he has lost his Glucometer and he would like a "free meter".                   Action/Plan: Talked with pt about his supplies. Informed him he can purchase a glucometer/strips [employer]  @ $9.00 each. Pt states he will purchase out of pocket and save his Medicaid benefits. No further CM needs at this time.    Expected Discharge Date:                  Expected Discharge Plan:  Home/Self Care  In-House Referral:     Discharge planning Services  CM Consult, Medication Assistance  Post Acute Care Choice:  NA Choice offered to:  Patient  DME Arranged:  N/A DME Agency:  NA  HH Arranged:  NA HH Agency:  NA  Status of Service:  Completed, signed off  If discussed at Long Length of Stay Meetings, dates discussed:    Additional Comments:  Yvone NeuCrutchfield, Elissa Grieshop M, RN 03/09/2016, 10:48 AM

## 2016-03-09 NOTE — Discharge Instructions (Signed)
Return here as needed.  Follow-up with your primary doctor. °

## 2016-03-12 ENCOUNTER — Emergency Department (HOSPITAL_COMMUNITY)
Admission: EM | Admit: 2016-03-12 | Discharge: 2016-03-13 | Disposition: A | Discharge status: Home / Self Care | Payer: Medicare Other | Attending: Emergency Medicine | Admitting: Emergency Medicine

## 2016-03-12 ENCOUNTER — Encounter (HOSPITAL_COMMUNITY): Payer: Self-pay | Admitting: *Deleted

## 2016-03-12 ENCOUNTER — Emergency Department (HOSPITAL_COMMUNITY)
Admission: EM | Admit: 2016-03-12 | Discharge: 2016-03-12 | Disposition: A | Discharge status: Home / Self Care | Payer: Medicare Other | Source: Home / Self Care | Attending: Emergency Medicine | Admitting: Emergency Medicine

## 2016-03-12 DIAGNOSIS — R45851 Suicidal ideations: Secondary | ICD-10-CM | POA: Diagnosis not present

## 2016-03-12 DIAGNOSIS — I10 Essential (primary) hypertension: Secondary | ICD-10-CM | POA: Insufficient documentation

## 2016-03-12 DIAGNOSIS — R443 Hallucinations, unspecified: Secondary | ICD-10-CM

## 2016-03-12 DIAGNOSIS — Z7982 Long term (current) use of aspirin: Secondary | ICD-10-CM | POA: Insufficient documentation

## 2016-03-12 DIAGNOSIS — F1220 Cannabis dependence, uncomplicated: Secondary | ICD-10-CM | POA: Insufficient documentation

## 2016-03-12 DIAGNOSIS — Z79899 Other long term (current) drug therapy: Secondary | ICD-10-CM

## 2016-03-12 DIAGNOSIS — F1490 Cocaine use, unspecified, uncomplicated: Secondary | ICD-10-CM

## 2016-03-12 DIAGNOSIS — N179 Acute kidney failure, unspecified: Secondary | ICD-10-CM | POA: Insufficient documentation

## 2016-03-12 DIAGNOSIS — F250 Schizoaffective disorder, bipolar type: Secondary | ICD-10-CM | POA: Insufficient documentation

## 2016-03-12 DIAGNOSIS — F1290 Cannabis use, unspecified, uncomplicated: Secondary | ICD-10-CM | POA: Insufficient documentation

## 2016-03-12 DIAGNOSIS — E119 Type 2 diabetes mellitus without complications: Secondary | ICD-10-CM

## 2016-03-12 DIAGNOSIS — E785 Hyperlipidemia, unspecified: Secondary | ICD-10-CM | POA: Insufficient documentation

## 2016-03-12 DIAGNOSIS — E1010 Type 1 diabetes mellitus with ketoacidosis without coma: Secondary | ICD-10-CM | POA: Diagnosis not present

## 2016-03-12 DIAGNOSIS — F419 Anxiety disorder, unspecified: Secondary | ICD-10-CM | POA: Insufficient documentation

## 2016-03-12 DIAGNOSIS — E221 Hyperprolactinemia: Secondary | ICD-10-CM | POA: Diagnosis not present

## 2016-03-12 DIAGNOSIS — R45850 Homicidal ideations: Secondary | ICD-10-CM | POA: Insufficient documentation

## 2016-03-12 DIAGNOSIS — Z794 Long term (current) use of insulin: Secondary | ICD-10-CM

## 2016-03-12 DIAGNOSIS — Z7984 Long term (current) use of oral hypoglycemic drugs: Secondary | ICD-10-CM | POA: Insufficient documentation

## 2016-03-12 DIAGNOSIS — M545 Low back pain, unspecified: Secondary | ICD-10-CM

## 2016-03-12 LAB — URINALYSIS, ROUTINE W REFLEX MICROSCOPIC
Bilirubin Urine: NEGATIVE
Hgb urine dipstick: NEGATIVE
Ketones, ur: NEGATIVE mg/dL
LEUKOCYTES UA: NEGATIVE
Nitrite: NEGATIVE
PROTEIN: NEGATIVE mg/dL
Specific Gravity, Urine: 1.031 — ABNORMAL HIGH (ref 1.005–1.030)
pH: 5.5 (ref 5.0–8.0)

## 2016-03-12 LAB — RAPID URINE DRUG SCREEN, HOSP PERFORMED
AMPHETAMINES: NOT DETECTED
Amphetamines: NOT DETECTED
BARBITURATES: NOT DETECTED
BARBITURATES: NOT DETECTED
BENZODIAZEPINES: NOT DETECTED
Benzodiazepines: NOT DETECTED
COCAINE: NOT DETECTED
Cocaine: NOT DETECTED
Opiates: NOT DETECTED
Opiates: NOT DETECTED
Tetrahydrocannabinol: POSITIVE — AB
Tetrahydrocannabinol: POSITIVE — AB

## 2016-03-12 LAB — URINE MICROSCOPIC-ADD ON
RBC / HPF: NONE SEEN RBC/hpf (ref 0–5)
SQUAMOUS EPITHELIAL / LPF: NONE SEEN

## 2016-03-12 LAB — CBC
HCT: 38 % — ABNORMAL LOW (ref 39.0–52.0)
HEMATOCRIT: 36.2 % — AB (ref 39.0–52.0)
Hemoglobin: 12.2 g/dL — ABNORMAL LOW (ref 13.0–17.0)
Hemoglobin: 12.5 g/dL — ABNORMAL LOW (ref 13.0–17.0)
MCH: 28.3 pg (ref 26.0–34.0)
MCH: 28.3 pg (ref 26.0–34.0)
MCHC: 33.7 g/dL (ref 30.0–36.0)
MCHC: 32.9 g/dL (ref 30.0–36.0)
MCV: 84 fL (ref 78.0–100.0)
MCV: 86.2 fL (ref 78.0–100.0)
PLATELETS: 262 10*3/uL (ref 150–400)
Platelets: 276 10*3/uL (ref 150–400)
RBC: 4.31 MIL/uL (ref 4.22–5.81)
RBC: 4.41 MIL/uL (ref 4.22–5.81)
RDW: 12.5 % (ref 11.5–15.5)
RDW: 12.4 % (ref 11.5–15.5)
WBC: 7.8 10*3/uL (ref 4.0–10.5)
WBC: 9.1 10*3/uL (ref 4.0–10.5)

## 2016-03-12 LAB — COMPREHENSIVE METABOLIC PANEL
ALK PHOS: 67 U/L (ref 38–126)
ALT: 14 U/L — ABNORMAL LOW (ref 17–63)
ALT: 15 U/L — ABNORMAL LOW (ref 17–63)
ANION GAP: 7 (ref 5–15)
AST: 16 U/L (ref 15–41)
AST: 18 U/L (ref 15–41)
Albumin: 3.6 g/dL (ref 3.5–5.0)
Albumin: 3.5 g/dL (ref 3.5–5.0)
Alkaline Phosphatase: 66 U/L (ref 38–126)
Anion gap: 12 (ref 5–15)
BILIRUBIN TOTAL: 0.6 mg/dL (ref 0.3–1.2)
BUN: 13 mg/dL (ref 6–20)
BUN: 12 mg/dL (ref 6–20)
CALCIUM: 9.1 mg/dL (ref 8.9–10.3)
CHLORIDE: 102 mmol/L (ref 101–111)
CO2: 21 mmol/L — ABNORMAL LOW (ref 22–32)
CO2: 18 mmol/L — ABNORMAL LOW (ref 22–32)
Calcium: 9.5 mg/dL (ref 8.9–10.3)
Chloride: 105 mmol/L (ref 101–111)
Creatinine, Ser: 0.92 mg/dL (ref 0.61–1.24)
Creatinine, Ser: 1.07 mg/dL (ref 0.61–1.24)
GLUCOSE: 317 mg/dL — AB (ref 65–99)
Glucose, Bld: 325 mg/dL — ABNORMAL HIGH (ref 65–99)
POTASSIUM: 4.1 mmol/L (ref 3.5–5.1)
Potassium: 3.9 mmol/L (ref 3.5–5.1)
Sodium: 133 mmol/L — ABNORMAL LOW (ref 135–145)
Sodium: 132 mmol/L — ABNORMAL LOW (ref 135–145)
TOTAL PROTEIN: 7.2 g/dL (ref 6.5–8.1)
Total Bilirubin: 0.9 mg/dL (ref 0.3–1.2)
Total Protein: 7.1 g/dL (ref 6.5–8.1)

## 2016-03-12 LAB — CBG MONITORING, ED: GLUCOSE-CAPILLARY: 262 mg/dL — AB (ref 65–99)

## 2016-03-12 LAB — SALICYLATE LEVEL

## 2016-03-12 LAB — ETHANOL

## 2016-03-12 LAB — ACETAMINOPHEN LEVEL: Acetaminophen (Tylenol), Serum: <10 ug/mL — ABNORMAL LOW (ref 10–30)

## 2016-03-12 MED ORDER — LIDOCAINE 5 % EX PTCH
1.0000 | MEDICATED_PATCH | CUTANEOUS | Status: DC
  Filled 2016-03-12: qty 1

## 2016-03-12 MED ORDER — METHOCARBAMOL 500 MG PO TABS: 1000.0000 mg | ORAL_TABLET | Freq: Once | ORAL | Status: DC

## 2016-03-12 NOTE — ED Provider Notes (Signed)
[facility]-EMERGENCY DEPT Provider Note   CSN: [id number] Arrival date & time: 03/12/16  1025     History   Chief Complaint Chief Complaint  Patient presents with  . Back Pain  . Suicidal    HPI [name] is a 54 y.o. male who presents with SI and back pain. PMH significant for schizophrenia, bipolar disorder, Hep C, depression/anxiety, HTN. He states that he has had SI for the past 4 days due to depression over the loss of his brother 4 months ago and stepson several weeks ago after being murdered. He does not have a specific plan or access to a gun at this time. He also feels hopeless due to living in a group home and having to pay 1200/mo to live there however he feels that the living conditions are poor. He reports associated visual and auditory hallucinations as well HI stating he wants to kill whoever harmed his family members. He states he has been taking his psychiatric medicines however has not been able to be compliant with his diabetic meds because his group home doesn't provide food and water. He is also reporting back pain on the left side. He states the bed he sleeps in is hard and he has fallen out of it 7 times. He denies substance use. He was recently discharged from [facility] on 8/11 after mood had stabilized.   HPI  Past Medical History:  Diagnosis Date  . Anxiety   . Bipolar affective disorder (HCC)   . Bipolar disorder (HCC) 03/11/2012  . Depression   . Diabetes mellitus   . Diabetes mellitus (HCC) 03/11/2012  . Hepatitis 06/30/2013   Type C  . Hypertension   . Schizophrenia, schizo-affective [facility])     Patient Active Problem List   Diagnosis Date Noted  . DKA (diabetic ketoacidoses) (HCC) 02/28/2016  . Chest pain 02/28/2016  . AKI (acute kidney injury) (HCC) 02/28/2016  . Suicidal ideation 02/06/2016  . Cannabis use disorder, moderate, dependence (HCC) 02/06/2016  . Schizoaffective disorder, bipolar type (HCC) 02/05/2016  . Hyperlipidemia 01/18/2015  .  Hyperprolactinemia (HCC) 01/18/2015  . Diabetes mellitus (HCC) 03/11/2012  . HTN (hypertension) 03/11/2012    Past Surgical History:  Procedure Laterality Date  . FINGER SURGERY    . WISDOM TOOTH EXTRACTION         Home Medications    Prior to Admission medications   Medication Sig Start Date End Date Taking? Authorizing Provider  glipiZIDE (GLUCOTROL) 10 MG tablet Take 1 tablet (10 mg total) by mouth 2 (two) times daily before a meal. 03/09/16  Yes Christopher Lawyer, PA-C  insulin aspart (NOVOLOG) 100 UNIT/ML injection Inject 0-15 Units into the skin 3 (three) times daily with meals. 03/09/16  Yes Christopher Lawyer, PA-C  insulin detemir (LEVEMIR) 100 UNIT/ML injection Inject 0.27 mLs (27 Units total) into the skin 2 (two) times daily. 03/09/16  Yes Charlestine Night, PA-C  metFORMIN (GLUCOPHAGE) 1000 MG tablet Take 1 tablet (1,000 mg total) by mouth 2 (two) times daily with a meal. For diabetes management 03/09/16  Yes Charlestine Night, PA-C  nitroGLYCERIN (NITROSTAT) 0.4 MG SL tablet Place 1 tablet (0.4 mg total) under the tongue every 5 (five) minutes as needed for chest pain (CP or SOB). 03/06/16  Yes Jolanta B Pucilowska, MD  simvastatin (ZOCOR) 40 MG tablet Take 1 tablet (40 mg total) by mouth daily at 6 PM. 03/06/16  Yes Jolanta B Pucilowska, MD  venlafaxine XR (EFFEXOR-XR) 150 MG 24 hr capsule Take 1 capsule (150 mg  total) by mouth daily with breakfast. 03/06/16  Yes Jolanta B Pucilowska, MD  ziprasidone (GEODON) 40 MG capsule Take 1 capsule (40 mg total) by mouth 2 (two) times daily with a meal. 03/06/16  Yes Jolanta B Pucilowska, MD  zolpidem (AMBIEN) 5 MG tablet Take 1 tablet (5 mg total) by mouth at bedtime as needed for sleep. 03/06/16  Yes Shari ProwsJolanta B Pucilowska, MD  Insulin Syringe-Needle U-100 30G 0.5 ML MISC As directed 03/09/16   Charlestine Nighthristopher Lawyer, PA-C    Family History Family History  Problem Relation Age of Onset  . Diabetes Mother   . Bipolar disorder Sister      Social History Social History  Substance Use Topics  . Smoking status: Never Smoker  . Smokeless tobacco: Never Used  . Alcohol use No     Allergies   Wellbutrin [bupropion]   Review of Systems Review of Systems  Eyes: Positive for visual disturbance.  Respiratory: Negative for shortness of breath.   Cardiovascular: Negative for chest pain.  Gastrointestinal: Negative for abdominal pain.  Musculoskeletal: Positive for back pain and myalgias. Negative for gait problem.  Skin: Negative for rash.  Neurological: Negative for headaches.  Psychiatric/Behavioral: Positive for dysphoric mood, hallucinations and suicidal ideas. Negative for self-injury.  All other systems reviewed and are negative.    Physical Exam Updated Vital Signs BP 150/88 (BP Location: Left Arm)   Pulse (!) 58   Temp 97.9 F (36.6 C) (Oral)   Resp 17   SpO2 100%   Physical Exam  Constitutional: He is oriented to person, place, and time. He appears well-developed and well-nourished. No distress.  HENT:  Head: Normocephalic and atraumatic.  Eyes: Conjunctivae are normal. Pupils are equal, round, and reactive to light. Right eye exhibits no discharge. Left eye exhibits no discharge. No scleral icterus.  Neck: Normal range of motion. Neck supple.  Cardiovascular: Normal rate and regular rhythm.  Exam reveals no gallop and no friction rub.   No murmur heard. Pulmonary/Chest: Effort normal and breath sounds normal. No respiratory distress. He has no wheezes. He has no rales. He exhibits no tenderness.  Abdominal: Soft. He exhibits no distension. There is no tenderness.  Musculoskeletal: He exhibits no edema.  Back: Inspection: No masses, deformity, or rash Palpation: No midline spinal tenderness. Left sided lumbar paraspinal muscle tenderness. Gait: Normal gait  Neurological: He is alert and oriented to person, place, and time. No cranial nerve deficit.  Skin: Skin is warm and dry.  Psychiatric: He  has a normal mood and affect.  Nursing note and vitals reviewed.    ED Treatments / Results  Labs (all labs ordered are listed, but only abnormal results are displayed) Labs Reviewed  COMPREHENSIVE METABOLIC PANEL - Abnormal; Notable for the following:       Result Value   Sodium 133 (*)    CO2 21 (*)    Glucose, Bld 317 (*)    ALT 14 (*)    All other components within normal limits  ACETAMINOPHEN LEVEL - Abnormal; Notable for the following:    Acetaminophen (Tylenol), Serum <10 (*)    All other components within normal limits  CBC - Abnormal; Notable for the following:    Hemoglobin 12.2 (*)    HCT 36.2 (*)    All other components within normal limits  URINE RAPID DRUG SCREEN, HOSP PERFORMED - Abnormal; Notable for the following:    Tetrahydrocannabinol POSITIVE (*)    All other components within normal limits  URINALYSIS, ROUTINE  W REFLEX MICROSCOPIC (NOT AT [facility]) - Abnormal; Notable for the following:    Specific Gravity, Urine 1.031 (*)    Glucose, UA >1000 (*)    All other components within normal limits  URINE MICROSCOPIC-ADD ON - Abnormal; Notable for the following:    Bacteria, UA RARE (*)    All other components within normal limits  ETHANOL  SALICYLATE LEVEL    EKG  EKG Interpretation None       Radiology No results found.  Procedures Procedures (including critical care time)  Medications Ordered in ED Medications  methocarbamol (ROBAXIN) tablet 1,000 mg (not administered)  lidocaine (LIDODERM) 5 % 1 patch (not administered)     Initial Impression / Assessment and Plan / ED Course  I have reviewed the triage vital signs and the nursing notes.  Pertinent labs & imaging results that were available during my care of the patient were reviewed by me and considered in my medical decision making (see chart for details).  Clinical Course   55 year old male with SI. Psych screening labs obtained which show mild hyponatremia. Glucose is elevated at 317  and UA shows >1000 glucose in urine. UDS positive for THC. Will consult TTS for evaluation. Robaxin and Lidocaine patch ordered for back pain. No red flag back pain signs/symptoms.  TTS advised they offered patient overnight stay - see note for details. I was advised that patient refused and was therefore discharged.   Final Clinical Impressions(s) / ED Diagnoses   Final diagnoses:  Schizoaffective disorder, bipolar type [facility](HCC)    New Prescriptions New Prescriptions   No medications on file     Bethel BornKelly Marie Cassandria Drew, PA-C 03/14/16 [phone]    Gerhard Munchobert Lockwood, MD 03/15/16 [phone]

## 2016-03-12 NOTE — ED Notes (Signed)
Ordered carb modified, no sharps dinner tray
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
## 2016-03-12 NOTE — ED Triage Notes (Signed)
Pt reports SI with plan to "shoot myself with a gun."  Denies having access to a gun but states "I have been calling people to bring me one."  Pt was just seen here for same Friday, states he was sent to a mental facility  But he did not like it over there because he was treated bad.  Pt is also c/o back pain and urinary frequency.

## 2016-03-12 NOTE — Progress Notes (Signed)
Patient was demanding of services and belligerent upon walking into the room.  Just discharged from [facility] but did not go to his follow-up appointments.  He is at his baseline at this time and needs to leave to follow-up at his outpatient provider's office.  Nanine MeansJamison Lord, PMH-NP

## 2016-03-12 NOTE — ED Notes (Signed)
Pt changed into scrubs.  Pt and belongings wanded by Security.  

## 2016-03-12 NOTE — ED Notes (Signed)
Pt refused a snack and something to drink.

## 2016-03-12 NOTE — BH Assessment (Addendum)
Assessment Note  Joseph LefortReginald Vasquez is an 55 y.o. male. Patient has a history of Anxiety, Bipolar Affective Disorder, Bipolar Disorder, Depression, and Schizophrenia. [facility] with a complaint of suicidal thoughts. Onset of suicidal thoughts started "many years ago". Sts that his depressive symptoms and suicidal thoughts have worsened in the past 2 months due to his living arrangements, death of brother, and death of son in law. Patient living in new group home for the past month. Sts that he is dissatisfied with the group home because, "They feed me nothing but bologna sandwiches and vienna sausages". Furthermore, patient complaints that the the people at the group home refuse to take him to any doctors appointment. Sts, "I pay $1200 a month and I get nothing in return". Patient also upset that his brother was murdered 6 months ago. Next, his son in law was found murdered 1 to 2 months ago.   Patient is suicidal with a plan to shoot self. He does not have access to gun. Sts, "My gun was taken away by a friend". He has tried to harm himself 3-4 times in the past (overdoses, cutting, shoot self 2x's). Patient also has a history of self mutilating behaviors (cutting). He describes his current depressive symptoms as loss of interest in usual pleasures, fatigue, crying spells, and anger/irritability. He also reports an increase in anxiety. Appetite is fair. Patient sleeps 4 to 5 hours of sleep per night with proper sleep aids.   Patient has homicidal thoughts to harm the individuals that murdered his son in law and brother. He doesn't identify a victim stating, "I don't know who killed them but if I find out I will kill them". He has a plan to shoot this unknown person. He has not access to firearms. No history of aggressive or assaultive issues. No legal issues.   Patient denies auditory and visual hallucinations. Patient does not appear to be responding to internal stimuli. Patient denies current  substance abuse but does have a history of THC and Cocaine use. Please see Additional Social History for details related to history of substance abuse.   Patient does not have a current psychiatrist or therapist. Sts that [facility]([place] in the past. He has [facility] and [facility].   Diagnosis: Major Depressive Disorder, Recurrent, Severe, without psychotic features; Bipolar Disorder; Schizophrenia;  Anxiety Disorder; Substance Use by history  Past Medical History:  Past Medical History:  Diagnosis Date  . Anxiety   . Bipolar affective disorder (HCC)   . Bipolar disorder (HCC) 03/11/2012  . Depression   . Diabetes mellitus   . Diabetes mellitus (HCC) 03/11/2012  . Hepatitis 06/30/2013   Type C  . Hypertension   . Schizophrenia, schizo-affective (HCC)     Past Surgical History:  Procedure Laterality Date  . FINGER SURGERY    . WISDOM TOOTH EXTRACTION      Family History:  Family History  Problem Relation Age of Onset  . Diabetes Mother   . Bipolar disorder Sister     Social History:  reports that he has never smoked. He has never used smokeless tobacco. He reports that he uses drugs, including Marijuana and Cocaine. He reports that he does not drink alcohol.  Additional Social History:  Alcohol / Drug Use Pain Medications: Pt denies Prescriptions: Abilify Over the Counter: Pt denies History of alcohol / drug use?: Yes Longest period of sobriety (when/how long): Six years Negative Consequences  of Use: Financial, Personal relationships, Work / Mining engineer #1 Name of Substance 1: Cocaine 1 - Age of First Use: 55 years old 1 - Amount (size/oz): varies 1 - Frequency: Patient reports that he uses infrequently 1 - Duration: On and off for the past couple of years, per patients  1 - Last Use / Amount: 14 days ago  Substance #2 Name of Substance 2: THC 2 - Age of First Use: 55 yrs old  2 - Amount  (size/oz): varies  2 - Frequency: rarely 2 - Duration: On and off for the past several years, per patient  2 - Last Use / Amount: 14 days ago   CIWA: CIWA-Ar BP: 150/88 Pulse Rate: (!) 58 COWS:    Allergies:  Allergies  Allergen Reactions  . Wellbutrin [Bupropion] Hives and Swelling    Home Medications:  (Not in a hospital admission)  OB/GYN Status:  No LMP for male patient.  General Assessment Data [facility]: [facility] T[facility] Assessment: In system Is this a Tele or Face-to-Face Assessment?: Face-to-Face Is this an Initial Assessment or a Re-assessment for this encounter?: Initial Assessment Marital status: Divorced [place] name:  (n/a) Is patient pregnant?: No Pregnancy Status: No Living Arrangements: Other (Comment), Group Home (Group Home/Idependent Living ) Can pt return to current living arrangement?: Yes Admission Status: Voluntary Is patient capable of signing voluntary admission?: Yes Referral Source: Other Insurance type:  Geophysicist/field seismologist )     Crisis Care Plan Living Arrangements: Other (Comment), Group Home (Group Home/Idependent Living ) Legal Guardian: Other: (no legal guardian ) Name of Psychiatrist: [facility] - Dr. Manson Passey  (no psychiatrist ) Name of Therapist: [facility] (no therapist )  Education Status Is patient currently in school?: No Current Grade:  (n/a) Highest grade of school patient has completed: High School graduate  Name of school: NA Contact person: NA  Risk to self with the past 6 months Suicidal Ideation: Yes-Currently Present Has patient been a risk to self within the past 6 months prior to admission? : Yes Suicidal Intent: Yes-Currently Present Has patient had any suicidal intent within the past 6 months prior to admission? : Yes Is patient at risk for suicide?: Yes Suicidal Plan?: Yes-Currently Present Has patient had any suicidal plan within the past 6 months prior to admission? : Yes Specify Current Suicidal  Plan:  (shoot self) Access to Means: No ("My home boy took my guns...so No") What has been your use of drugs/alcohol within the last 12 months?:  (patient reports a history of thc and cocaine use ) Previous Attempts/Gestures: Yes How many times?:  (3-4 prevoius suicide attempts; OD, cut self, shoot self 2x's) Other Self Harm Risks:  (his of cutting ) Triggers for Past Attempts: Unpredictable Intentional Self Injurious Behavior: Cutting (in the past ) Comment - Self Injurious Behavior:  (past history of cutting ) Family Suicide History: Yes ("My sister made a suicide attempt") Recent stressful life event(s): Other (Comment) (loss a brother 76mo ago & step son murdered 1.5 to 2months ) Persecutory voices/beliefs?: No Depression: Yes Depression Symptoms: Feeling worthless/self pity, Feeling angry/irritable, Loss of interest in usual pleasures, Guilt, Fatigue, Isolating, Tearfulness, Insomnia, Despondent Substance abuse history and/or treatment for substance abuse?: No Suicide prevention information given to non-admitted patients: Not applicable  Risk to Others within the past 6 months Homicidal Ideation: Yes-Currently Present Does patient have any lifetime risk of violence toward others beyond the six months prior to admission? : Yes (comment) Thoughts of Harm to Others: Yes-Currently  Present Comment - Thoughts of Harm to Others:  ("Want to kill people that killed my brother and step son") Current Homicidal Intent: Yes-Currently Present Current Homicidal Plan: Yes-Currently Present Describe Current Homicidal Plan:  ("Im going to shoot them or cut they throat") Access to Homicidal Means: No Describe Access to Homicidal Means:  (no access to gun..taken away by friend; access to knives ) Identified Victim:  ("People that killed brother and step son") History of harm to others?: No Assessment of Violence: None Noted Violent Behavior Description:  (patient is calm and cooperative ) Does patient  have access to weapons?: No Criminal Charges Pending?: No Does patient have a court date: No Is patient on probation?: No  Psychosis Hallucinations: Auditory Delusions: None noted  Mental Status Report Appearance/Hygiene: Unremarkable Eye Contact: Fair Motor Activity: Freedom of movement Speech: Logical/coherent Level of Consciousness: Alert Mood: Pleasant Affect: Appropriate to circumstance Anxiety Level: Minimal Thought Processes: Coherent Judgement: Impaired Orientation: Person, Place, Time, Situation Obsessive Compulsive Thoughts/Behaviors: None  Cognitive Functioning Concentration: Decreased Memory: Recent Intact, Remote Intact IQ: Average Insight: Fair Impulse Control: Poor Appetite: Poor Weight Loss:  (0) Weight Gain:  (0) Sleep: Decreased Total Hours of Sleep:  (4 hrs of sleep per night ) Vegetative Symptoms: Decreased grooming  ADLScreening [facility]) Patient's cognitive ability adequate to safely complete daily activities?: Yes Patient able to express need for assistance with ADLs?: Yes Independently performs ADLs?: Yes (appropriate for developmental age)  Prior Inpatient Therapy Prior Inpatient Therapy: Yes Prior Therapy Dates: January 2017 [facility] Reason for Treatment: SI and Psychosis  Prior Outpatient Therapy Prior Outpatient Therapy: Yes Prior Therapy Dates: Ongoing  Prior Therapy Facilty/Provider(s):  ([facility] in the past ) Reason for Treatment: Medication Management and Outpatient Therapy Does patient have an ACCT team?: No Does patient have Intensive In-House Services?  : No Does patient have [facility] services? : No Does patient have P4CC services?: No  ADL Screening (condition at time of admission) Patient's cognitive ability adequate to safely complete daily activities?: Yes Is the patient deaf or have difficulty hearing?: No Does the patient have difficulty seeing, even when wearing  glasses/contacts?: No Does the patient have difficulty concentrating, remembering, or making decisions?: Yes Patient able to express need for assistance with ADLs?: Yes Does the patient have difficulty dressing or bathing?: No Independently performs ADLs?: Yes (appropriate for developmental age) Does the patient have difficulty walking or climbing stairs?: No Weakness of Legs: None Weakness of Arms/Hands: None  Home Assistive Devices/Equipment Home Assistive Devices/Equipment: None    Abuse/Neglect Assessment (Assessment to be complete while patient is alone) Physical Abuse: Denies Verbal Abuse: Yes, past (Comment) Sexual Abuse: Denies Exploitation of patient/patient's resources: Denies Self-Neglect: Denies Possible abuse reported to:: [place] department of social services     Advance Directives (For Healthcare) Does patient have an advance directive?: No Would patient like information on creating an advanced directive?: No - patient declined information Nutrition Screen- [facility] Patient's home diet: Regular  Additional Information 1:1 In Past 12 Months?: No CIRT Risk: No Elopement Risk: No Does patient have medical clearance?: Yes     Disposition: Patient offered/refused OBS; Jamison re-evaluated patient and he became belligerent, yelling, and cursing at this Clinical research associatewriter and provider Nanine Means(Jamison Lord, DNP). Patient left [facility] AMA.  Disposition Initial Assessment Completed for this Encounter: Yes  On Site Evaluation by:   Reviewed with Physician: Nanine MeansJamison Lord, DNP    Melynda Rippleerry, Rudell Marlowe [facility] 03/12/2016 12:39 PM

## 2016-03-12 NOTE — [facility] Counselor (Signed)
Clinician referred pt to the following for inpatient treatment: [facility], [facility], [facility], [facility], [address], [place], and [facility].   Gwinda Passereylese D Bennett, MS, [facility], [facility] Triage Specialist [phone]

## 2016-03-12 NOTE — ED Provider Notes (Signed)
[facility]ENCY DEPT Provider Note   CSN: [id number] Arrival date & time: 03/12/16  1438     History   Chief Complaint Chief Complaint  Patient presents with  . Suicidal    HPI [name] is a [age] y.o. male.  HPI 55 year old gentleman with a history of bipolar disorder and schizophrenia presents to the ED with a reported suicide attempt. He reports trying to shoot himself in the head however the gun jammed. He reports prior suicide attempts in the past by overdosing and cutting. Patient also endorses auditory hallucinations telling him to kill himself and everybody else. Patient also endorses homicidal ideation stating that someone killed his nephew several months ago and he started track down the person who did so. Patient reports that he has been compliant with his psychiatric medicine.  Additionally patient endorses lower back pain for several days, stating that it started after trying to lift something heavy. He denies any urinary or bowel incontinence. No difficulty walking. No paresthesias or numbness. Patient denies any trauma.  Past Medical History:  Diagnosis Date  . Anxiety   . Bipolar affective disorder (HCC)   . Bipolar disorder (HCC) 03/11/2012  . Depression   . Diabetes mellitus   . Diabetes mellitus (HCC) 03/11/2012  . Hepatitis 06/30/2013   Type C  . Hypertension   . Schizophrenia, schizo-affective [facility](HCC)     Patient Active Problem List   Diagnosis Date Noted  . DKA (diabetic ketoacidoses) (HCC) 02/28/2016  . Chest pain 02/28/2016  . AKI (acute kidney injury) (HCC) 02/28/2016  . Suicidal ideation 02/06/2016  . Cannabis use disorder, moderate, dependence (HCC) 02/06/2016  . Schizoaffective disorder, bipolar type (HCC) 02/05/2016  . Hyperlipidemia 01/18/2015  . Hyperprolactinemia (HCC) 01/18/2015  . Diabetes mellitus (HCC) 03/11/2012  . HTN (hypertension) 03/11/2012    Past Surgical History:  Procedure Laterality Date  . FINGER SURGERY    . WISDOM  TOOTH EXTRACTION         Home Medications    Prior to Admission medications   Medication Sig Start Date End Date Taking? Authorizing Provider  acetaminophen (TYLENOL) 325 MG tablet Take 325-650 mg by mouth every 6 (six) hours as needed (for back pain).   Yes Historical Provider, MD  aspirin EC 325 MG tablet Take 325 mg by mouth daily as needed for mild pain.   Yes Historical Provider, MD  glipiZIDE (GLUCOTROL) 10 MG tablet Take 1 tablet (10 mg total) by mouth 2 (two) times daily before a meal. 03/09/16  Yes Charlestine Nighthristopher Lawyer, PA-C  ibuprofen (ADVIL,MOTRIN) 200 MG tablet Take 200-400 mg by mouth every 6 (six) hours as needed (for back pain).   Yes Historical Provider, MD  insulin aspart (NOVOLOG) 100 UNIT/ML injection Inject 0-15 Units into the skin 3 (three) times daily with meals. Patient taking differently: Inject 0-15 Units into the skin 3 (three) times daily with meals. Per sliding scale 03/09/16  Yes Charlestine Nighthristopher Lawyer, PA-C  insulin detemir (LEVEMIR) 100 UNIT/ML injection Inject 0.27 mLs (27 Units total) into the skin 2 (two) times daily. Patient taking differently: Inject 20-27 Units into the skin See admin instructions. 25-27 units in the morning and 20 units at bedtime 03/09/16  Yes Charlestine Nighthristopher Lawyer, PA-C  Insulin Syringe-Needle U-100 30G 0.5 ML MISC As directed 03/09/16  Yes Charlestine Nighthristopher Lawyer, PA-C  metFORMIN (GLUCOPHAGE) 1000 MG tablet Take 1 tablet (1,000 mg total) by mouth 2 (two) times daily with a meal. For diabetes management 03/09/16  Yes Charlestine Nighthristopher Lawyer, PA-C  nitroGLYCERIN (NITROSTAT) 0.4  MG SL tablet Place 1 tablet (0.4 mg total) under the tongue every 5 (five) minutes as needed for chest pain (CP or SOB). 03/06/16  Yes Jolanta B Pucilowska, MD  simvastatin (ZOCOR) 40 MG tablet Take 1 tablet (40 mg total) by mouth daily at 6 PM. 03/06/16  Yes Jolanta B Pucilowska, MD  venlafaxine XR (EFFEXOR-XR) 150 MG 24 hr capsule Take 1 capsule (150 mg total) by mouth daily with  breakfast. 03/06/16  Yes Jolanta B Pucilowska, MD  ziprasidone (GEODON) 40 MG capsule Take 1 capsule (40 mg total) by mouth 2 (two) times daily with a meal. 03/06/16  Yes Jolanta B Pucilowska, MD  zolpidem (AMBIEN) 5 MG tablet Take 1 tablet (5 mg total) by mouth at bedtime as needed for sleep. 03/06/16  Yes Shari Prows, MD    Family History Family History  Problem Relation Age of Onset  . Diabetes Mother   . Bipolar disorder Sister     Social History Social History  Substance Use Topics  . Smoking status: Never Smoker  . Smokeless tobacco: Never Used  . Alcohol use No     Allergies   Wellbutrin [bupropion]   Review of Systems Review of Systems  Constitutional: Negative for appetite change, chills, fatigue and fever.  HENT: Negative for congestion and sore throat.   Eyes: Negative for visual disturbance.  Respiratory: Negative for cough, chest tightness and shortness of breath.   Cardiovascular: Negative for chest pain and palpitations.  Gastrointestinal: Negative for abdominal pain, blood in stool, diarrhea, nausea and vomiting.  Endocrine: Negative for cold intolerance and heat intolerance.  Genitourinary: Negative for decreased urine volume, difficulty urinating and frequency.  Musculoskeletal: Negative for back pain and neck stiffness.  Skin: Negative for rash.  Neurological: Negative for dizziness, weakness, light-headedness and headaches.  Psychiatric/Behavioral: Positive for hallucinations, self-injury and suicidal ideas.  All other systems reviewed and are negative.    Physical Exam Updated Vital Signs BP 138/72 (BP Location: Right Arm)   Pulse 100   Temp 97.9 F (36.6 C) (Oral)   Resp 18   Ht 5\' 8"  (1.727 m)   Wt 211 lb (95.7 kg)   SpO2 100%   BMI 32.08 kg/m   Physical Exam  Constitutional: He is oriented to person, place, and time. He appears well-nourished. No distress.  HENT:  Head: Normocephalic and atraumatic.  Right Ear: External ear  normal.  Left Ear: External ear normal.  Eyes: Pupils are equal, round, and reactive to light. Right eye exhibits no discharge. Left eye exhibits no discharge. No scleral icterus.  Neck: Normal range of motion. Neck supple.  Cardiovascular: Normal rate.  Exam reveals no gallop and no friction rub.   No murmur heard. Pulmonary/Chest: Effort normal and breath sounds normal. No stridor. No respiratory distress. He has no wheezes. He has no rales. He exhibits no tenderness.  Abdominal: Soft. He exhibits no distension and no mass. There is no tenderness. There is no rebound and no guarding.  Musculoskeletal: He exhibits no edema or tenderness.       Back:  Neurological: He is alert and oriented to person, place, and time.  Skin: Skin is warm and dry. No rash noted. He is not diaphoretic. No erythema.     ED Treatments / Results  Labs (all labs ordered are listed, but only abnormal results are displayed) Labs Reviewed  COMPREHENSIVE METABOLIC PANEL - Abnormal; Notable for the following:       Result Value   Sodium 132 (*)  CO2 18 (*)    Glucose, Bld 325 (*)    ALT 15 (*)    All other components within normal limits  ACETAMINOPHEN LEVEL - Abnormal; Notable for the following:    Acetaminophen (Tylenol), Serum <10 (*)    All other components within normal limits  CBC - Abnormal; Notable for the following:    Hemoglobin 12.5 (*)    HCT 38.0 (*)    All other components within normal limits  URINE RAPID DRUG SCREEN, HOSP PERFORMED - Abnormal; Notable for the following:    Tetrahydrocannabinol POSITIVE (*)    All other components within normal limits  CBG MONITORING, ED - Abnormal; Notable for the following:    Glucose-Capillary 262 (*)    All other components within normal limits  ETHANOL  SALICYLATE LEVEL    EKG  EKG Interpretation None       Radiology No results found.  Procedures Procedures (including critical care time)  Medications Ordered in ED Medications - No  data to display   Initial Impression / Assessment and Plan / ED Course  I have reviewed the triage vital signs and the nursing notes.  Pertinent labs & imaging results that were available during my care of the patient were reviewed by me and considered in my medical decision making (see chart for details).  Clinical Course    1. Suicidal ideations, homicidal ideations, auditory hallucinations.  History of schizophrenia and bipolar with reported compliance. Prior suicide attempts. Psychiatric labs obtained revealed no medical instability. Psychiatry consulted for evaluation. Pending their recommendations   2. Lower back pain Consistent with muscle strain. No red flags. Doubt cauda equina. Given Tylenol.  Final Clinical Impressions(s) / ED Diagnoses   Final diagnoses:  Suicidal thoughts  Hallucination  Bilateral low back pain without sciatica      Nira ConnPedro Eduardo Tarrence Enck, MD 03/13/16 0000

## 2016-03-12 NOTE — ED Notes (Signed)
Dr. Cardama at bedside at this time.  

## 2016-03-12 NOTE — ED Notes (Signed)
TTS in progress at this time.  

## 2016-03-12 NOTE — ED Notes (Signed)
Per staff, Pt began screaming at Psych NP after being informed that he was being discharged.  Pt given belongings and left department prior to receiving d/c paperwork.  Pt refused d/c vitals.

## 2016-03-12 NOTE — ED Triage Notes (Signed)
Pt c/o SI & HI onset x 5 days, pt hx of attempts of SI & attempts with overdose, shooting self & has plan to walk out into traffic, pt last use of cocaine 03/04/16, pt recent lose of stepson who was killed 1 mth ago, [facility] [place] [facility], & [facility], pt reports auditory hallucinations telling him to kill himself & others, calm & cooperative at this time

## 2016-03-13 ENCOUNTER — Inpatient Hospital Stay (HOSPITAL_COMMUNITY)
Admission: AD | Admit: 2016-03-13 | Discharge: 2016-03-24 | DRG: 885 | Disposition: A | Discharge status: Home / Self Care | Payer: Medicare Other | Source: Intra-hospital | Attending: Psychiatry | Admitting: Psychiatry

## 2016-03-13 ENCOUNTER — Encounter (HOSPITAL_COMMUNITY): Payer: Self-pay | Admitting: Emergency Medicine

## 2016-03-13 DIAGNOSIS — F1220 Cannabis dependence, uncomplicated: Secondary | ICD-10-CM | POA: Diagnosis present

## 2016-03-13 DIAGNOSIS — F332 Major depressive disorder, recurrent severe without psychotic features: Secondary | ICD-10-CM | POA: Diagnosis present

## 2016-03-13 DIAGNOSIS — F250 Schizoaffective disorder, bipolar type: Principal | ICD-10-CM | POA: Diagnosis present

## 2016-03-13 DIAGNOSIS — Z794 Long term (current) use of insulin: Secondary | ICD-10-CM

## 2016-03-13 DIAGNOSIS — F259 Schizoaffective disorder, unspecified: Secondary | ICD-10-CM | POA: Diagnosis present

## 2016-03-13 DIAGNOSIS — Z833 Family history of diabetes mellitus: Secondary | ICD-10-CM

## 2016-03-13 DIAGNOSIS — Z9119 Patient's noncompliance with other medical treatment and regimen: Secondary | ICD-10-CM

## 2016-03-13 DIAGNOSIS — F4323 Adjustment disorder with mixed anxiety and depressed mood: Secondary | ICD-10-CM | POA: Diagnosis present

## 2016-03-13 DIAGNOSIS — Z818 Family history of other mental and behavioral disorders: Secondary | ICD-10-CM

## 2016-03-13 DIAGNOSIS — R45851 Suicidal ideations: Secondary | ICD-10-CM

## 2016-03-13 DIAGNOSIS — F411 Generalized anxiety disorder: Secondary | ICD-10-CM | POA: Diagnosis present

## 2016-03-13 DIAGNOSIS — I10 Essential (primary) hypertension: Secondary | ICD-10-CM | POA: Diagnosis present

## 2016-03-13 DIAGNOSIS — E119 Type 2 diabetes mellitus without complications: Secondary | ICD-10-CM | POA: Diagnosis present

## 2016-03-13 DIAGNOSIS — E785 Hyperlipidemia, unspecified: Secondary | ICD-10-CM | POA: Diagnosis present

## 2016-03-13 DIAGNOSIS — G4700 Insomnia, unspecified: Secondary | ICD-10-CM | POA: Diagnosis present

## 2016-03-13 DIAGNOSIS — Z590 Homelessness: Secondary | ICD-10-CM

## 2016-03-13 DIAGNOSIS — F1210 Cannabis abuse, uncomplicated: Secondary | ICD-10-CM | POA: Diagnosis present

## 2016-03-13 DIAGNOSIS — F4310 Post-traumatic stress disorder, unspecified: Secondary | ICD-10-CM | POA: Diagnosis present

## 2016-03-13 DIAGNOSIS — Z9114 Patient's other noncompliance with medication regimen: Secondary | ICD-10-CM

## 2016-03-13 LAB — CBG MONITORING, ED
Glucose-Capillary: 232 mg/dL — ABNORMAL HIGH (ref 65–99)
Glucose-Capillary: 196 mg/dL — ABNORMAL HIGH (ref 65–99)
Glucose-Capillary: 207 mg/dL — ABNORMAL HIGH (ref 65–99)

## 2016-03-13 LAB — GLUCOSE, CAPILLARY: Glucose-Capillary: 228 mg/dL — ABNORMAL HIGH (ref 65–99)

## 2016-03-13 MED ORDER — ZIPRASIDONE HCL 40 MG PO CAPS
40.0000 mg | ORAL_CAPSULE | Freq: Two times a day (BID) | ORAL | Status: DC
  Administered 2016-03-14 – 2016-03-15 (×3): 40 mg via ORAL
  Filled 2016-03-13 (×5): qty 1

## 2016-03-13 MED ORDER — ZIPRASIDONE HCL 20 MG PO CAPS
40.0000 mg | ORAL_CAPSULE | Freq: Two times a day (BID) | ORAL | Status: DC
  Administered 2016-03-13: 40 mg via ORAL
  Filled 2016-03-13: qty 2

## 2016-03-13 MED ORDER — INSULIN DETEMIR 100 UNIT/ML ~~LOC~~ SOLN: 20.0000 [IU] | SUBCUTANEOUS | Status: DC

## 2016-03-13 MED ORDER — GLIPIZIDE 10 MG PO TABS
10.0000 mg | ORAL_TABLET | Freq: Two times a day (BID) | ORAL | Status: DC
  Filled 2016-03-13: qty 1

## 2016-03-13 MED ORDER — INSULIN DETEMIR 100 UNIT/ML ~~LOC~~ SOLN
20.0000 [IU] | Freq: Every day | SUBCUTANEOUS | Status: DC
  Administered 2016-03-13: 20 [IU] via SUBCUTANEOUS
  Filled 2016-03-13 (×3): qty 0.2

## 2016-03-13 MED ORDER — ZOLPIDEM TARTRATE 5 MG PO TABS: 5.0000 mg | ORAL_TABLET | Freq: Every evening | ORAL | Status: DC | PRN

## 2016-03-13 MED ORDER — ASPIRIN EC 325 MG PO TBEC: 325.0000 mg | DELAYED_RELEASE_TABLET | Freq: Every day | ORAL | Status: DC | PRN

## 2016-03-13 MED ORDER — GLIPIZIDE 10 MG PO TABS
10.0000 mg | ORAL_TABLET | Freq: Two times a day (BID) | ORAL | Status: DC
  Administered 2016-03-13: 10 mg via ORAL
  Filled 2016-03-13 (×3): qty 1

## 2016-03-13 MED ORDER — METFORMIN HCL 500 MG PO TABS
1000.0000 mg | ORAL_TABLET | Freq: Two times a day (BID) | ORAL | Status: DC
  Administered 2016-03-13: 1000 mg via ORAL
  Filled 2016-03-13: qty 2

## 2016-03-13 MED ORDER — ACETAMINOPHEN 500 MG PO TABS: 1000.0000 mg | ORAL_TABLET | Freq: Once | ORAL | Status: DC

## 2016-03-13 MED ORDER — ZIPRASIDONE MESYLATE 20 MG IM SOLR
20.0000 mg | Freq: Once | INTRAMUSCULAR | Status: AC
  Administered 2016-03-13: 20 mg via INTRAMUSCULAR
  Filled 2016-03-13: qty 20

## 2016-03-13 MED ORDER — INSULIN DETEMIR 100 UNIT/ML ~~LOC~~ SOLN
25.0000 [IU] | Freq: Every day | SUBCUTANEOUS | Status: DC
  Administered 2016-03-14 – 2016-03-24 (×11): 25 [IU] via SUBCUTANEOUS

## 2016-03-13 MED ORDER — GLIPIZIDE 10 MG PO TABS
10.0000 mg | ORAL_TABLET | Freq: Two times a day (BID) | ORAL | Status: DC
  Administered 2016-03-13 – 2016-03-24 (×22): 10 mg via ORAL
  Filled 2016-03-13: qty 2
  Filled 2016-03-13: qty 1
  Filled 2016-03-13: qty 2
  Filled 2016-03-13 (×8): qty 1
  Filled 2016-03-13: qty 2
  Filled 2016-03-13 (×2): qty 1
  Filled 2016-03-13: qty 2
  Filled 2016-03-13 (×3): qty 1
  Filled 2016-03-13: qty 2
  Filled 2016-03-13: qty 1
  Filled 2016-03-13: qty 2
  Filled 2016-03-13 (×11): qty 1

## 2016-03-13 MED ORDER — HYDROXYZINE HCL 25 MG PO TABS
25.0000 mg | ORAL_TABLET | Freq: Three times a day (TID) | ORAL | Status: DC | PRN
Start: 2016-03-13 — End: 2016-03-18
  Filled 2016-03-13: qty 1

## 2016-03-13 MED ORDER — ZIPRASIDONE HCL 40 MG PO CAPS
40.0000 mg | ORAL_CAPSULE | Freq: Two times a day (BID) | ORAL | Status: DC
  Administered 2016-03-15 – 2016-03-24 (×18): 40 mg via ORAL
  Filled 2016-03-13 (×8): qty 1
  Filled 2016-03-13: qty 2
  Filled 2016-03-13 (×16): qty 1
  Filled 2016-03-13: qty 2
  Filled 2016-03-13: qty 1

## 2016-03-13 MED ORDER — VENLAFAXINE HCL ER 150 MG PO CP24
150.0000 mg | ORAL_CAPSULE | Freq: Every day | ORAL | Status: DC
  Administered 2016-03-13: 150 mg via ORAL
  Filled 2016-03-13: qty 1

## 2016-03-13 MED ORDER — VENLAFAXINE HCL ER 150 MG PO CP24
150.0000 mg | ORAL_CAPSULE | Freq: Every day | ORAL | Status: DC
  Administered 2016-03-14 – 2016-03-24 (×11): 150 mg via ORAL
  Filled 2016-03-13 (×14): qty 1

## 2016-03-13 MED ORDER — INSULIN ASPART 100 UNIT/ML ~~LOC~~ SOLN
0.0000 [IU] | Freq: Three times a day (TID) | SUBCUTANEOUS | Status: DC
  Administered 2016-03-13: 3 [IU] via SUBCUTANEOUS
  Administered 2016-03-13: 5 [IU] via SUBCUTANEOUS
  Filled 2016-03-13 (×2): qty 1

## 2016-03-13 MED ORDER — ZOLPIDEM TARTRATE 5 MG PO TABS
5.0000 mg | ORAL_TABLET | Freq: Every evening | ORAL | Status: DC | PRN
  Administered 2016-03-15: 5 mg via ORAL
  Filled 2016-03-13: qty 1

## 2016-03-13 MED ORDER — METFORMIN HCL 500 MG PO TABS: 1000.0000 mg | ORAL_TABLET | Freq: Two times a day (BID) | ORAL | Status: DC

## 2016-03-13 MED ORDER — ACETAMINOPHEN 325 MG PO TABS
325.0000 mg | ORAL_TABLET | Freq: Four times a day (QID) | ORAL | Status: DC | PRN
  Administered 2016-03-13: 650 mg via ORAL
  Filled 2016-03-13: qty 2

## 2016-03-13 MED ORDER — INSULIN ASPART 100 UNIT/ML ~~LOC~~ SOLN
0.0000 [IU] | Freq: Three times a day (TID) | SUBCUTANEOUS | Status: DC
  Administered 2016-03-14: 2 [IU] via SUBCUTANEOUS
  Administered 2016-03-15 (×2): 3 [IU] via SUBCUTANEOUS
  Administered 2016-03-15 – 2016-03-16 (×2): 5 [IU] via SUBCUTANEOUS
  Administered 2016-03-16: 11 [IU] via SUBCUTANEOUS
  Administered 2016-03-16: 3 [IU] via SUBCUTANEOUS
  Administered 2016-03-17: 8 [IU] via SUBCUTANEOUS
  Administered 2016-03-17 (×2): 3 [IU] via SUBCUTANEOUS
  Administered 2016-03-18: 8 [IU] via SUBCUTANEOUS
  Administered 2016-03-18 (×2): 3 [IU] via SUBCUTANEOUS
  Administered 2016-03-19: 5 [IU] via SUBCUTANEOUS
  Administered 2016-03-19: 06:00:00 via SUBCUTANEOUS
  Administered 2016-03-20: 5 [IU] via SUBCUTANEOUS
  Administered 2016-03-20 – 2016-03-21 (×3): 3 [IU] via SUBCUTANEOUS
  Administered 2016-03-21: 8 [IU] via SUBCUTANEOUS
  Administered 2016-03-21: 3 [IU] via SUBCUTANEOUS
  Administered 2016-03-22 (×2): 5 [IU] via SUBCUTANEOUS
  Administered 2016-03-22: 3 [IU] via SUBCUTANEOUS
  Administered 2016-03-23: 5 [IU] via SUBCUTANEOUS
  Administered 2016-03-23: 17:00:00 via SUBCUTANEOUS
  Administered 2016-03-23 – 2016-03-24 (×2): 3 [IU] via SUBCUTANEOUS

## 2016-03-13 MED ORDER — SIMVASTATIN 40 MG PO TABS
40.0000 mg | ORAL_TABLET | Freq: Every day | ORAL | Status: DC
  Administered 2016-03-14 – 2016-03-23 (×10): 40 mg via ORAL
  Filled 2016-03-13 (×10): qty 1
  Filled 2016-03-13: qty 2
  Filled 2016-03-13: qty 1

## 2016-03-13 MED ORDER — INSULIN DETEMIR 100 UNIT/ML ~~LOC~~ SOLN
25.0000 [IU] | Freq: Every day | SUBCUTANEOUS | Status: DC
  Administered 2016-03-13: 25 [IU] via SUBCUTANEOUS
  Filled 2016-03-13 (×2): qty 0.25

## 2016-03-13 MED ORDER — MAGNESIUM HYDROXIDE 400 MG/5ML PO SUSP: 30.0000 mL | Freq: Every day | ORAL | Status: DC | PRN

## 2016-03-13 MED ORDER — INSULIN DETEMIR 100 UNIT/ML ~~LOC~~ SOLN
20.0000 [IU] | Freq: Every day | SUBCUTANEOUS | Status: DC
  Administered 2016-03-13 – 2016-03-16 (×4): 20 [IU] via SUBCUTANEOUS

## 2016-03-13 MED ORDER — SIMVASTATIN 40 MG PO TABS
40.0000 mg | ORAL_TABLET | Freq: Every day | ORAL | Status: DC
  Filled 2016-03-13: qty 1

## 2016-03-13 MED ORDER — INSULIN ASPART 100 UNIT/ML ~~LOC~~ SOLN: 0.0000 [IU] | Freq: Three times a day (TID) | SUBCUTANEOUS | Status: DC

## 2016-03-13 MED ORDER — METFORMIN HCL 500 MG PO TABS
1000.0000 mg | ORAL_TABLET | Freq: Two times a day (BID) | ORAL | Status: DC
  Administered 2016-03-13 – 2016-03-24 (×22): 1000 mg via ORAL
  Filled 2016-03-13 (×27): qty 2

## 2016-03-13 MED ORDER — DIPHENHYDRAMINE HCL 50 MG/ML IJ SOLN
50.0000 mg | Freq: Once | INTRAMUSCULAR | Status: AC
  Administered 2016-03-13: 50 mg via INTRAMUSCULAR
  Filled 2016-03-13: qty 1

## 2016-03-13 MED ORDER — ALUM & MAG HYDROXIDE-SIMETH 200-200-20 MG/5ML PO SUSP: 30.0000 mL | ORAL | Status: DC | PRN

## 2016-03-13 NOTE — Progress Notes (Signed)
D: Pt endorses severe depression, severe anxiety, SI and auditory hallucination; states, "the voices are telling me to harm myself; I am tired of hearing them." Pt verbally contracts for safety. Pt flat and unwilling to participate; in bed all evening with eyes closed. Pt also endorses mild back pain; "I just need to rest." Pt however denied HI at the time of assessment. A: Medications offered as prescribed.  Support, encouragement, and safe environment provided. R: Pt was med compliant. Safety checks will continue for patient's safety.

## 2016-03-13 NOTE — ED Notes (Signed)
Patient placed phone call.

## 2016-03-13 NOTE — ED Notes (Signed)
Pharmacy contact about Levemir for patient.

## 2016-03-13 NOTE — Progress Notes (Signed)
Attempted Telepsych 3 times. Cart is giving error as if the [facility] card is turned off or not connected. [facility]ed [facility] cart and it is working properly with other test calls. Spoke to RN and two CNA's. RN stated she would put the machine in the room in a few minutes. Still unavailable. Spoke to the two CNA's after this and they cannot locate the machine. They report that they will notify me. I advised them that we have 2 assessments pending and that this is urgent.   Beau FannyWithrow, Shellene Sweigert C, [place]NP 03/13/2016 9:46 AM

## 2016-03-13 NOTE — ED Notes (Signed)
Patient lunch delivered.  

## 2016-03-13 NOTE — ED Notes (Signed)
TTS in process 

## 2016-03-13 NOTE — ED Notes (Signed)
Meal tray placed.
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
## 2016-03-13 NOTE — Progress Notes (Signed)
Patient A/O, no noted distress. Denies pain. SI patient attempted to shoot self, but gun jammed. He admits his attempt was resulted to grieving the loss of his brother (5months ago) and son (2 months ago). He feels HI to the person that killed his step son. He states that he knows it will not make him feel better, if he does. AH hearing voices telling him to shoot self and shoot the person killed step son.  [facility] was there for 7-8 days resulted SI. Patient was tearful during the admission. He was educated on Hospice grief services. Patient does not feel safe with living arrangements, so would like for staff to find him housing placement. He has not been able to sleep for 2-3 days and appetite decreased. He is new area, from [place] and does not have a pcp local. Last attempt SI, 2 months ago. Support is mother and sister. Patient does have access to firearms. Staff is suffering from grief resulted to not able to cope with losses. Patient used marijuana and cocaine 17 days ago to try to cope with grief. Patient may need grief counseling. Staff will continue to monitor, meet needs, and maintain safety.

## 2016-03-13 NOTE — ED Notes (Signed)
Patient refused a snack. 

## 2016-03-13 NOTE — [facility] Counselor (Signed)
OBS Counselor met with this pt to discuss possible discharge disposition. Pt shared that he would like assistance with getting into a group home. Pt is also experiencing frequent episodes of depression and grief due to the passing of his brother and step-son. Pt was provided with information on the [facility] program in an attempt to get him started with looking for housing and having access to mental health services. Pt also asked if he would be going on the adult inpatient unit due to his depression. After reading disposition from assessment it is determined that this pt meets criteria for inpatient admission. Pt has not been seen by an extender as of this evening.

## 2016-03-13 NOTE — ED Notes (Signed)
Waiting on Glipizide to be sent from pharmacy for patient.

## 2016-03-13 NOTE — Progress Notes (Signed)
[facility], attending Dr. Lucianne MussKumar. [facility] Number to call report is [phone] or [phone].  Ilean SkillMeghan Keiaira Donlan, MSW, LCSW Clinical Social Work, Disposition  03/13/2016 [phone]

## 2016-03-13 NOTE — Progress Notes (Signed)
Patient ID: Andre LefortReginald [place], male   DOB: 05/26/1961, 55 y.o.   MRN: [id number] Per State regulations 482.30 this chart was reviewed for medical necessity with respect to the patient's admission/duration of stay.    Next review date: 03/14/16  Thurman CoyerEric Zoraida Havrilla, BSN, RN-BC  Case Manager

## 2016-03-13 NOTE — ED Notes (Signed)
Patient states he is still SI but refuses to answer any other assessment questions. Patient does not go into detail on plan.

## 2016-03-13 NOTE — ED Notes (Signed)
Pharmacy contacted and advised wrong dose sent Levemir needs to be resent.

## 2016-03-14 ENCOUNTER — Other Ambulatory Visit: Payer: Self-pay

## 2016-03-14 ENCOUNTER — Encounter (HOSPITAL_COMMUNITY): Payer: Self-pay

## 2016-03-14 DIAGNOSIS — R45851 Suicidal ideations: Secondary | ICD-10-CM | POA: Diagnosis present

## 2016-03-14 DIAGNOSIS — G4700 Insomnia, unspecified: Secondary | ICD-10-CM | POA: Diagnosis present

## 2016-03-14 DIAGNOSIS — F4323 Adjustment disorder with mixed anxiety and depressed mood: Secondary | ICD-10-CM | POA: Diagnosis present

## 2016-03-14 DIAGNOSIS — E785 Hyperlipidemia, unspecified: Secondary | ICD-10-CM | POA: Diagnosis present

## 2016-03-14 DIAGNOSIS — F259 Schizoaffective disorder, unspecified: Secondary | ICD-10-CM | POA: Diagnosis present

## 2016-03-14 DIAGNOSIS — F1210 Cannabis abuse, uncomplicated: Secondary | ICD-10-CM | POA: Diagnosis present

## 2016-03-14 DIAGNOSIS — I10 Essential (primary) hypertension: Secondary | ICD-10-CM | POA: Diagnosis present

## 2016-03-14 DIAGNOSIS — F4310 Post-traumatic stress disorder, unspecified: Secondary | ICD-10-CM | POA: Diagnosis present

## 2016-03-14 DIAGNOSIS — Z833 Family history of diabetes mellitus: Secondary | ICD-10-CM | POA: Diagnosis not present

## 2016-03-14 DIAGNOSIS — Z590 Homelessness: Secondary | ICD-10-CM | POA: Diagnosis not present

## 2016-03-14 DIAGNOSIS — E119 Type 2 diabetes mellitus without complications: Secondary | ICD-10-CM | POA: Diagnosis present

## 2016-03-14 DIAGNOSIS — Z9119 Patient's noncompliance with other medical treatment and regimen: Secondary | ICD-10-CM | POA: Diagnosis not present

## 2016-03-14 DIAGNOSIS — F250 Schizoaffective disorder, bipolar type: Secondary | ICD-10-CM | POA: Diagnosis present

## 2016-03-14 DIAGNOSIS — Z9114 Patient's other noncompliance with medication regimen: Secondary | ICD-10-CM | POA: Diagnosis not present

## 2016-03-14 DIAGNOSIS — F411 Generalized anxiety disorder: Secondary | ICD-10-CM | POA: Diagnosis present

## 2016-03-14 DIAGNOSIS — Z818 Family history of other mental and behavioral disorders: Secondary | ICD-10-CM | POA: Diagnosis not present

## 2016-03-14 DIAGNOSIS — Z794 Long term (current) use of insulin: Secondary | ICD-10-CM | POA: Diagnosis not present

## 2016-03-14 LAB — GLUCOSE, CAPILLARY
GLUCOSE-CAPILLARY: 95 mg/dL (ref 65–99)
Glucose-Capillary: 124 mg/dL — ABNORMAL HIGH (ref 65–99)
Glucose-Capillary: 90 mg/dL (ref 65–99)

## 2016-03-14 MED ORDER — ALUM & MAG HYDROXIDE-SIMETH 200-200-20 MG/5ML PO SUSP: 30.0000 mL | ORAL | Status: DC | PRN

## 2016-03-14 MED ORDER — HYDROXYZINE HCL 25 MG PO TABS
25.0000 mg | ORAL_TABLET | Freq: Three times a day (TID) | ORAL | Status: DC | PRN
  Administered 2016-03-16: 25 mg via ORAL

## 2016-03-14 MED ORDER — MAGNESIUM HYDROXIDE 400 MG/5ML PO SUSP: 30.0000 mL | Freq: Every day | ORAL | Status: DC | PRN

## 2016-03-14 MED ORDER — ACETAMINOPHEN 325 MG PO TABS
650.0000 mg | ORAL_TABLET | Freq: Four times a day (QID) | ORAL | Status: DC | PRN
Start: 2016-03-14 — End: 2016-03-24
  Administered 2016-03-15 – 2016-03-17 (×2): 650 mg via ORAL
  Filled 2016-03-14: qty 2

## 2016-03-14 NOTE — Progress Notes (Addendum)
D: Patient denies HI, patient reports SI and reports that he does not know if he could contract for safety; patient denies visual hallucinations; patient reports auditory hallucinations of voices telling him to harm himself; patient has not verbalized a plan; patient reports that he does not know if he cant contract for safety; patient states that I may tell you if I think I may act something out;patient reports sleep to be; reports appetite to be ; reports energy level is ; reports ability to pay attention to; rates depression as 10/10; rates hopelessness 10/10; rates anxiety as 10/10;   A: Monitored constantly with the exception of bathroom privilege times;  patient educated about medications; patient given medications per physician orders; patient encouraged to express feelings and/or concerns  R: Patient is sad and flat; patient is really concerned about quieting the voices;  patient's interaction with staff and peers is minimal; patient encouraged to talk with staff 1:1 when having feelings of SI; or acting anything out

## 2016-03-14 NOTE — Tx Team (Signed)
Initial Interdisciplinary Treatment Plan   PATIENT STRESSORS: Health problems Loss of brother and step son   PATIENT STRENGTHS: Wellsite geologistCommunication skills General fund of knowledge   PROBLEM LIST: Problem List/Patient Goals Date to be addressed Date deferred Reason deferred Estimated date of resolution  Depression 03/14/16     Suicidal ideation 03/14/16     Psychosis 03/14/16     "Get better and live my life on my terms" 03/14/16     "I would like to go to a group home" 03/14/16                              DISCHARGE CRITERIA:  Improved stabilization in mood, thinking, and/or behavior Verbal commitment to aftercare and medication compliance  PRELIMINARY DISCHARGE PLAN: Outpatient therapy Medication management  PATIENT/FAMIILY INVOLVEMENT: This treatment plan has been presented to and reviewed with the patient, Andre LefortReginald Tamas.  The patient and family have been given the opportunity to ask questions and make suggestions.  Norm ParcelHeather V Claudeen Leason 03/14/2016, 9:57 PM

## 2016-03-14 NOTE — Progress Notes (Signed)
Report given to Nikki RN by SBAR 

## 2016-03-14 NOTE — Progress Notes (Signed)
Performed by Francella SolianJoy H. RN

## 2016-03-14 NOTE — Progress Notes (Signed)
Treatment Plan/Recommendations: Will continue with current medications and recommendations as listed in OBS note. Patient continues to meet criteria for inpatient admission. Patient interviewed. Chart reviewed. Labs and vitals reviewed. Patient continues to present with depressive symptoms and expresses active SI. He is wanting to get help for his substance abuse.  Will d/c from Observation and admit to unit 400- 406 For Substance Induced Mood D/O, Adjustment disorder w/ mixed anxiety and depressive features. Pt is aware of admission. Average length of stay 2-4 days.

## 2016-03-14 NOTE — Progress Notes (Signed)
[name] is an 55 y.o. male being admitted voluntarily to 406-1 from OBS unit.  He was admitted to the OBS unit for suicidal ideation and attempt with trying to shoot self.  His gun jammed twice and his roommate called the police.  He reported multiple stressors; brother murdered four months ago after serving eighteen years in prison, step-son was murdered a month and a half ago and found out last week who allegedly murdered his brother (tried to report to police but he felt that they did not care).   He and his friends went to try to find the person who allegedly killed his brother and he stated that he would have killed him himself if he could have found him.  He reported hearing voices telling him: "he's no good, hurt himself and others." Pt reported he see people following him.  He feels like all the deaths in his family have been too much to deal with.  He has history of multiple suicide attempts in the past (OD, cutting, walking into traffic).  Pt reported he had has inpatient admission a [place] and [facility]. He reported feeling sadness, irritable mood, sleeps changes, difficulty concentrating, low self-esteem, hallucinations, poor hygiene and social isolation. He has history of substance abuse treatment in [place].  He is currently trying to get in to see a psychiatrist and counselor [facility].  He is diagnosed with Schizoaffective- Bipolar Type.  He continues to voice suicidal ideation but is able to contract for safety on the unit.  He denies HI or Visual hallucinations.  He does continue to report auditory hallucinations that tell him he is no good.  He is c/o tooth ache that is 10/10.  Oriented him to the unit.  Admission paperwork reviewed and signed.  Belongings searched and secured in locker 51.  Skin assessment completed and no skin issues noted.  Q 15 minute checks initiated for safety.  We will monitor the progress towards his goals.

## 2016-03-14 NOTE — Progress Notes (Addendum)
Patient transferred to 400 units. Escorted by Herbert SetaHeather RN to the search room for admission process. Patient ambulatory, no distress noted.

## 2016-03-14 NOTE — Progress Notes (Signed)
Patient made aware of his transfer to 400 hall. Awaiting his transfer when admission nurse is ready.

## 2016-03-14 NOTE — H&P (Signed)
Psychiatric Admission Assessment Adult  Patient Identification: [name] MRN:  [id number] Date of Evaluation:  03/14/2016 Chief Complaint:  Schizoaffective Bipolar type Principal Diagnosis: Schizoaffective disorder, bipolar type (HCC) Diagnosis:   Patient Active Problem List   Diagnosis Date Noted  . MDD (major depressive disorder), recurrent severe, without psychosis (HCC) [F33.2] 03/13/2016  . DKA (diabetic ketoacidoses) (HCC) [E13.10] 02/28/2016  . Chest pain [R07.9] 02/28/2016  . AKI (acute kidney injury) (HCC) [N17.9] 02/28/2016  . Suicidal ideation [R45.851] 02/06/2016  . Cannabis use disorder, moderate, dependence (HCC) [F12.20] 02/06/2016  . Schizoaffective disorder, bipolar type (HCC) [F25.0] 02/05/2016  . Hyperlipidemia [E78.5] 01/18/2015  . Hyperprolactinemia (HCC) [E22.1] 01/18/2015  . Diabetes mellitus (HCC) [E11.9] 03/11/2012  . HTN (hypertension) [I10] 03/11/2012   History of Present Illness:  Information was obtained from the patient and the TTS assessment. The patient has a long history of depression, mood instability and substance use with multiple psychiatric admissions and medication trials. During his last hospitalization (03/04/2016) Zoloft and Abilify was switched to Effexor and Geodon however the patient did not take it consistently. He has a history of non compliance with medication and poor control of his diabetes which subsequently lead to another hospitalization on the medical floor prior to admission to psychiatry. He reports trying to overdose on insulin during that admission. The patient became increasingly depressed with poor sleep, decreased appetite, anhedonia, feeling of guilt of energy and concentration, crying spells and social isolation. He started experiencing suicidal ideation.  He reports panic attacks, social anxiety and PTSD type of symptoms. There is no OCD. He denies alcohol use but admits to smoking marijuana. He reports he stayed with a  buddy for 4 days and there was a lot of drug use but he did not use. He notes that this was not a safe environment for him as there were guns everyone and he grabbed one of the guns and tried to shoot himself twice but it jammed. He became guilty and called EMS.   Past psychiatric history: Schizoaffective, Suicidal ideation, Suicide attempt, Substance use disorder.  Prior Inpatient Therapy:  Multiple admissions to [facility], [facility], [place], [facility] care in psychiatric for both depression, substance use, suicidal ideations, and mania. Previous medications: Lithium, Depakote, Tegretol, Seroquel Geodon, Effexor, Zoloft , Abilify and Risperdal.  Prior Outpatient Therapy:  Last time he reported two suicide attempts by cutting and overdose.  He attempted substance abuse treatment several times. His last treatment was in [place] where he stayed in the program for 4 months until January 2017, which he completed successfully and then he reports his brother passed away.   Upon today's evaluation: [name] is a 55 year old male with history of depression, mood instability, and substance abuse who was transferred from ED to OBS for crisis stabilization. He continues to endorse suicidal ideations and auditory hallucinations. He currently meets criteria for inpatient admission and medication management for suicidal ideation the context of treatment noncompliance and severe social stressors including housing, food, grief, and medication compliance. During the evaluation patient remained oriented, calm and cooperative, there was no evidence of thought blocking, paranoia, hallucinations or response to internal stimuli. He presents with clear thought process and logical thinking. Suspect patient is malingering to receive services, history of multiple ED visits and admissions for the like he is familiar with inpatient requirements.   Family psychiatric history. Sister with schizophrenia.  Total Time spent with patient: 1  hour  Is the patient at risk to self? Yes.    Has the  patient been a risk to self in the past 6 months? Yes.    Has the patient been a risk to self within the distant past? No.  Is the patient a risk to others? No.  Has the patient been a risk to others in the past 6 months? No.  Has the patient been a risk to others within the distant past? No.    Alcohol Screening: 1. How often do you have a drink containing alcohol?: Never 2. How many drinks containing alcohol do you have on a typical day when you are drinking?: 1 or 2 3. How often do you have six or more drinks on one occasion?: Never Preliminary Score: 0 9. Have you or someone else been injured as a result of your drinking?: No 10. Has a relative or friend or a doctor or another health worker been concerned about your drinking or suggested you cut down?: No Alcohol Use Disorder Identification Test Final Score (AUDIT): 0 Brief Intervention: AUDIT score less than 7 or less-screening does not suggest unhealthy drinking-brief intervention not indicated Substance Abuse History in the last 12 months:  Yes.   Consequences of Substance Abuse: Negative Previous Psychotropic Medications: Yes  Psychological Evaluations: No  Past Medical History:  Past Medical History:  Diagnosis Date  . Anxiety   . Bipolar affective disorder (HCC)   . Bipolar disorder (HCC) 03/11/2012  . Depression   . Diabetes mellitus   . Diabetes mellitus (HCC) 03/11/2012  . Hepatitis 06/30/2013   Type C  . Hypertension   . Schizophrenia, schizo-affective (HCC)     Past Surgical History:  Procedure Laterality Date  . FINGER SURGERY    . WISDOM TOOTH EXTRACTION     Family History:  Family History  Problem Relation Age of Onset  . Diabetes Mother   . Bipolar disorder Sister    Tobacco Screening: Have you used any form of tobacco in the last 30 days? (Cigarettes, Smokeless Tobacco, Cigars, and/or Pipes): No Social History:  History  Alcohol Use No      History  Drug Use  . Types: Marijuana, Cocaine    Comment: 4 days ago last THC and cocoaine use... relapsed after 2 yrs    Social history. He is disabled from mental illness. He is originally from [place]. He used to live with his friend but is no longer possible that he is homeless now. The patient again asks to be placed in a group home but last admission he change his mind at the last moment.  Allergies:   Allergies  Allergen Reactions  . Wellbutrin [Bupropion] Hives and Swelling   Lab Results:  Results for orders placed or performed during the hospital encounter of 03/13/16 (from the past 48 hour(s))  Glucose, capillary     Status: Abnormal   Collection Time: 03/13/16  7:52 PM  Result Value Ref Range   Glucose-Capillary 228 (H) 65 - 99 mg/dL  Glucose, capillary     Status: Abnormal   Collection Time: 03/14/16  6:42 AM  Result Value Ref Range   Glucose-Capillary 124 (H) 65 - 99 mg/dL  Glucose, capillary     Status: None   Collection Time: 03/14/16 11:35 AM  Result Value Ref Range   Glucose-Capillary 90 65 - 99 mg/dL    Blood Alcohol level:  Lab Results  Component Value Date   ETH <5 03/12/2016   ETH <5 03/12/2016    Metabolic Disorder Labs:  Lab Results  Component Value Date   HGBA1C  11.8 (H) 02/08/2016   MPG 275 08/03/2015   MPG 295 01/17/2015   Lab Results  Component Value Date   PROLACTIN 3.4 (L) 02/08/2016   PROLACTIN 26.5 (H) 01/17/2015   Lab Results  Component Value Date   CHOL 213 (H) 02/08/2016   TRIG 127 02/08/2016   HDL 42 02/08/2016   CHOLHDL 5.1 02/08/2016   VLDL 25 02/08/2016   LDLCALC 146 (H) 02/08/2016   LDLCALC 183 (H) 08/03/2015    Current Medications: Current Facility-Administered Medications  Medication Dose Route Frequency Provider Last Rate Last Dose  . alum & mag hydroxide-simeth (MAALOX/MYLANTA) 200-200-20 MG/5ML suspension 30 mL  30 mL Oral Q4H PRN Beau Fanny, FNP      . glipiZIDE (GLUCOTROL) tablet 10 mg  10 mg Oral BID  AC Nelly Rout, MD   10 mg at 03/14/16 0659  . hydrOXYzine (ATARAX/VISTARIL) tablet 25 mg  25 mg Oral TID PRN Beau Fanny, FNP      . insulin aspart (novoLOG) injection 0-15 Units  0-15 Units Subcutaneous TID WC Beau Fanny, FNP   2 Units at 03/14/16 1610  . insulin detemir (LEVEMIR) injection 20 Units  20 Units Subcutaneous QHS Beau Fanny, FNP   20 Units at 03/13/16 2258  . insulin detemir (LEVEMIR) injection 25 Units  25 Units Subcutaneous Daily Beau Fanny, FNP   25 Units at 03/14/16 0912  . magnesium hydroxide (MILK OF MAGNESIA) suspension 30 mL  30 mL Oral Daily PRN Beau Fanny, FNP      . metFORMIN (GLUCOPHAGE) tablet 1,000 mg  1,000 mg Oral BID WC Nelly Rout, MD   1,000 mg at 03/14/16 0908  . simvastatin (ZOCOR) tablet 40 mg  40 mg Oral q1800 Beau Fanny, FNP      . venlafaxine XR (EFFEXOR-XR) 24 hr capsule 150 mg  150 mg Oral Q breakfast Beau Fanny, FNP   150 mg at 03/14/16 0908  . ziprasidone (GEODON) capsule 40 mg  40 mg Oral BID WC Beau Fanny, FNP      . ziprasidone (GEODON) capsule 40 mg  40 mg Oral BID WC Beau Fanny, FNP   40 mg at 03/14/16 1003  . zolpidem (AMBIEN) tablet 5 mg  5 mg Oral QHS PRN Beau Fanny, FNP       PTA Medications: Prescriptions Prior to Admission  Medication Sig Dispense Refill Last Dose  . acetaminophen (TYLENOL) 325 MG tablet Take 325-650 mg by mouth every 6 (six) hours as needed (for back pain).   03/11/2016 at Unknown time  . aspirin EC 325 MG tablet Take 325 mg by mouth daily as needed for mild pain.   Past Month at Unknown time  . glipiZIDE (GLUCOTROL) 10 MG tablet Take 1 tablet (10 mg total) by mouth 2 (two) times daily before a meal. 60 tablet 1 03/11/2016  . ibuprofen (ADVIL,MOTRIN) 200 MG tablet Take 200-400 mg by mouth every 6 (six) hours as needed (for back pain).   03/11/2016  . insulin aspart (NOVOLOG) 100 UNIT/ML injection Inject 0-15 Units into the skin 3 (three) times daily with meals. (Patient taking  differently: Inject 0-15 Units into the skin 3 (three) times daily with meals. Per sliding scale) 10 mL 11 03/11/2016  . insulin detemir (LEVEMIR) 100 UNIT/ML injection Inject 0.27 mLs (27 Units total) into the skin 2 (two) times daily. (Patient taking differently: Inject 20-27 Units into the skin See admin instructions. 25-27 units in the morning and  20 units at bedtime) 10 mL 11 03/11/2016  . metFORMIN (GLUCOPHAGE) 1000 MG tablet Take 1 tablet (1,000 mg total) by mouth 2 (two) times daily with a meal. For diabetes management 60 tablet 1 03/11/2016  . nitroGLYCERIN (NITROSTAT) 0.4 MG SL tablet Place 1 tablet (0.4 mg total) under the tongue every 5 (five) minutes as needed for chest pain (CP or SOB). 30 tablet 1 unknown  . simvastatin (ZOCOR) 40 MG tablet Take 1 tablet (40 mg total) by mouth daily at 6 PM. 30 tablet 1 03/11/2016  . venlafaxine XR (EFFEXOR-XR) 150 MG 24 hr capsule Take 1 capsule (150 mg total) by mouth daily with breakfast. 30 capsule 1 03/11/2016  . ziprasidone (GEODON) 40 MG capsule Take 1 capsule (40 mg total) by mouth 2 (two) times daily with a meal. 60 capsule 1 03/11/2016 at Unknown time  . zolpidem (AMBIEN) 5 MG tablet Take 1 tablet (5 mg total) by mouth at bedtime as needed for sleep. 30 tablet 1 03/10/2016  . Insulin Syringe-Needle U-100 30G 0.5 ML MISC As directed 100 each 0     Musculoskeletal: Strength & Muscle Tone: within normal limits Gait & Station: normal Patient leans: N/A  Psychiatric Specialty Exam: I reviewed physical examination performed on the medical floor and agree with the findings. Physical Exam  Nursing note and vitals reviewed. Constitutional: He appears well-developed and well-nourished.  HENT:  Head: Normocephalic.  Eyes: Pupils are equal, round, and reactive to light.  Neck: Normal range of motion.  Psychiatric: His speech is normal and behavior is normal. Judgment and thought content normal. His mood appears not anxious. Cognition and memory are  normal. He exhibits a depressed mood.  Reports AVH "hurt myself and others"     Review of Systems  Psychiatric/Behavioral: Positive for depression, hallucinations, substance abuse and suicidal ideas. Negative for memory loss. The patient has insomnia. The patient is not nervous/anxious.   All other systems reviewed and are negative.   Blood pressure (!) 106/54, pulse (!) 51, temperature 98.4 F (36.9 C), temperature source Oral, resp. rate 18, height 5\' 7"  (1.702 m), weight 93 kg (205 lb), SpO2 100 %.Body mass index is 32.11 kg/m.  General Appearance: casual, fairly groomed in paper scrubs  Eye Contact:  Good  Speech:  Normal rate, clear, coherent  Volume:  normal  Mood:  depressed  Affect:  Congruent, Appropriate  Thought Process:  Coherent and Goal Directed  Orientation:  Full (Time, Place, and Person)  Thought Content:  Focused on coming inpatient "malingering". Positive for AVH.   Suicidal Thoughts:  Yes.  with intent/plan stating he wants to shoot himself; he denied access to guns 24 hours prior and [facility]   Homicidal Thoughts:  No  Memory:  Immediate;   Fair Recent;   Fair  Judgement:  Fair  Insight:  Fair  Psychomotor Activity:  Restlessness  Concentration:  Concentration: Fair and Attention Span: Fair  Recall:  [employer]  Fund of Knowledge:  Good  Language:  Good  Akathisia:  No  Handed:  Right  AIMS (if indicated):     Assets:  Communication Skills Desire for Improvement Financial Resources/Insurance Leisure Time Resilience Social Support  ADL's:  Intact  Cognition:  WNL  Sleep:        Treatment Plan Summary: Daily contact with patient to assess and evaluate symptoms and progress in treatment and Medication management   Plan: Suicidal ideation. The patient is able to contract for safety in the hospital only.  He reports Auditory hallucinations reporting to harm himself and others, he does not appear to be responding to internal stimuli at this  time.  Mood. We restarted Effexor for depression and Geodon for mood stabiization and psychosis. Substance abuse treatment. He is positive for cannabinoids.   Disposition. The patient wants to be placed in a group home or assisted living. He is noting that he needs help with his diabetes management and other medical conditions. SW to assist.    Observation Level/Precautions:  15 minute checks  Laboratory:  CBC Chemistry Profile UDS UA  Psychotherapy:  Individual therapy  Medications:  Restart home medications. Will restart Effexor and Geodon for crisis stabilization.    Consultations:  Per need  Discharge Concerns:  Drug use, shelter, and medication compliance  Estimated LOS:24 hour observation, will be admitted to inpatient unit once beds become available.   Other:     I certify that inpatient services furnished can reasonably be expected to improve the patient's condition.    Truman Hayward, FNP 8/18/20172:30 PM

## 2016-03-15 ENCOUNTER — Encounter (HOSPITAL_COMMUNITY): Payer: Self-pay | Admitting: Psychiatry

## 2016-03-15 DIAGNOSIS — R45851 Suicidal ideations: Secondary | ICD-10-CM

## 2016-03-15 LAB — GLUCOSE, CAPILLARY
GLUCOSE-CAPILLARY: 165 mg/dL — AB (ref 65–99)
Glucose-Capillary: 203 mg/dL — ABNORMAL HIGH (ref 65–99)
Glucose-Capillary: 194 mg/dL — ABNORMAL HIGH (ref 65–99)
Glucose-Capillary: 203 mg/dL — ABNORMAL HIGH (ref 65–99)

## 2016-03-15 NOTE — [facility] Group Notes (Signed)
[facility] Group Notes:  (Nursing/MHT/Case Management/Adjunct)  Date:  03/15/2016  Time:  6:42 PM  Type of Therapy:  Nurse Education  Participation Level:  Minimal  Participation Quality:  Inattentive  Affect:  Blunted  Cognitive:  Alert  Insight:  Lacking  Engagement in Group:  Limited  Modes of Intervention:  Problem-solving  Summary of Progress/Problems: Patient refused to answer questions regarding his goal, or any coping skills that he uses.  Almira Barenny G Chellie Vanlue 03/15/2016, 6:42 PM

## 2016-03-15 NOTE — [facility] Suicide Risk Assessment (Signed)
[facility] Admission Suicide Risk Assessment   Nursing information obtained from:  Patient Demographic factors:  Male, Divorced or widowed, Unemployed, Access to firearms Current Mental Status:  Suicidal ideation indicated by patient, Thoughts of violence towards others Loss Factors:  Loss of significant relationship Historical Factors:  Prior suicide attempts, Family history of suicide Risk Reduction Factors:  Living with another person, especially a relative  Total Time spent with patient: 30 minutes Principal Problem: Schizoaffective disorder, bipolar type (HCC) Diagnosis:   Patient Active Problem List   Diagnosis Date Noted  . DKA (diabetic ketoacidoses) (HCC) [E13.10] 02/28/2016  . Chest pain [R07.9] 02/28/2016  . AKI (acute kidney injury) (HCC) [N17.9] 02/28/2016  . Suicidal ideation [R45.851] 02/06/2016  . Cannabis use disorder, moderate, dependence (HCC) [F12.20] 02/06/2016  . Schizoaffective disorder, bipolar type (HCC) [F25.0] 02/05/2016  . Hyperlipidemia [E78.5] 01/18/2015  . Hyperprolactinemia (HCC) [E22.1] 01/18/2015  . Diabetes mellitus (HCC) [E11.9] 03/11/2012  . HTN (hypertension) [I10] 03/11/2012   Subjective Data: Patient with depressive sx, SI , plan to shoot self with gun, several recent deaths in family.   Continued Clinical Symptoms:  Alcohol Use Disorder Identification Test Final Score (AUDIT): 0 The "Alcohol Use Disorders Identification Test", Guidelines for Use in Primary Care, Second Edition.  World Science writerHealth Organization [facility](WHO). Score between 0-7:  no or low risk or alcohol related problems. Score between 8-15:  moderate risk of alcohol related problems. Score between 16-19:  high risk of alcohol related problems. Score 20 or above:  warrants further diagnostic evaluation for alcohol dependence and treatment.   CLINICAL FACTORS:   Previous Psychiatric Diagnoses and Treatments Medical Diagnoses and Treatments/Surgeries   Musculoskeletal: Strength & Muscle  Tone: within normal limits Gait & Station: normal Patient leans: N/A  Psychiatric Specialty Exam: Physical Exam  Nursing note and vitals reviewed.   Review of Systems  Psychiatric/Behavioral: Positive for depression and suicidal ideas. The patient is nervous/anxious.   All other systems reviewed and are negative.   Blood pressure 117/78, pulse 65, temperature 98.8 F (37.1 C), resp. rate 20, height 5\' 8"  (1.727 m), weight 93 kg (205 lb), SpO2 100 %.Body mass index is 31.17 kg/m.  General Appearance: Disheveled  Eye Contact:  Fair  Speech:  Clear and Coherent  Volume:  Decreased  Mood:  Anxious, Depressed and Dysphoric  Affect:  Appropriate  Thought Process:  Goal Directed and Descriptions of Associations: Circumstantial  Orientation:  Full (Time, Place, and Person)  Thought Content:  Rumination  Suicidal Thoughts:  Yes.  with intent/plan  Homicidal Thoughts:  No  Memory:  Immediate;   Fair Recent;   Fair Remote;   Fair  Judgement:  Impaired  Insight:  Shallow  Psychomotor Activity:  Restlessness  Concentration:  Concentration: Fair and Attention Span: Fair  Recall:  [employer]  Fund of Knowledge:  Fair  Language:  Fair  Akathisia:  No  Handed:  Right  AIMS (if indicated):     Assets:  Communication Skills Desire for Improvement  ADL's:  Intact  Cognition:  WNL  Sleep:  Number of Hours: 6.75      COGNITIVE FEATURES THAT CONTRIBUTE TO RISK:  Closed-mindedness, Polarized thinking and Thought constriction (tunnel vision)    SUICIDE RISK:   Severe:  Frequent, intense, and enduring suicidal ideation, specific plan, no subjective intent, but some objective markers of intent (i.e., choice of lethal method), the method is accessible, some limited preparatory behavior, evidence of impaired self-control, severe dysphoria/symptomatology, multiple risk factors present, and few if any protective  factors, particularly a lack of social support.   PLAN OF CARE: Please see H&p for  plan.  I certify that inpatient services furnished can reasonably be expected to improve the patient's condition.  Cliford Sequeira, MD 03/15/2016, 2:16 PM

## 2016-03-15 NOTE — [facility] Group Notes (Signed)
[facility] Group Notes:  (Clinical Social Work)  03/15/2016    11:00AM-12:00PM  Summary of Progress/Problems:   The main focus of today's process group was to explore in depth the perceived benefits and costs of unhealthy coping techniques, as well as the  benefits and costs of replacing that with a healthy coping skills.  After talking about the possible benefits and definite costs of patients' specific unhealthy coping techniques, the patients were given the opportunity to write a goodbye letter to the unhealthy coping of their choice.  Some patients shared their letters with the group.  It was a tearful group with a lot of mutual support.  The patient expressed a) healthy and b) unhealthy coping techniques including a) listening to music and spending time with his grandchildren/taking them to the park.  B) unhealthy includes drugs.  He did not talk during group at all.  Type of Therapy:  Group Therapy - Process   Participation Level:  Active  Participation Quality:  Attentive  Affect:  Depressed  Cognitive:  Appropriate  Insight:  Developing/Improving  Engagement in Therapy:  Improving  Modes of Intervention:  Exploration, Activity  Ambrose MantleMareida Grossman-Orr, LCSW 03/15/2016, 1:43 PM

## 2016-03-15 NOTE — [facility] Counselor (Signed)
Adult Comprehensive Assessment  Patient ID: Joseph LefortReginald Vasquez, male   DOB: 02/04/1961, 55 y.o.   MRN: [id number]  Information Source: Information source: Patient  Current Stressors:  Employment / Job issues: Patient is disabled Surveyor, quantityinancial / Lack of resources (include bankruptcy): Fixed income Housing / Lack of housing: Patient does not feel his group home with [facility] is meeting his needs and wants another group home Physical health (include injuries & life threatening diseases): Diabetes, Hep C Bereavement / Loss: Brother died several months ago, step sone died about 2 months ago  Living/Environment/Situation:  Living Arrangements: Group Home Living conditions (as described by patient or guardian): "I need someplace that can do more for me. I have diabetes." "If I'm giving up my money I want someplace that has TV and AC." How long has patient lived in current situation?: 1 week What is atmosphere in current home: Temporary  Family History:  Marital status: Divorced Divorced, when?: "couple of years" What types of issues is patient dealing with in the relationship?: none Does patient have children?: Yes How many children?: 1 How is patient's relationship with their children?: Good relationship with adult daughter  Childhood History:  By whom was/is the patient raised?: Mother Description of patient's relationship with caregiver when they were a child: Good relationship Patient's description of current relationship with people who raised him/her: Very good relationship Does patient have siblings?: Yes Number of Siblings: 1 Description of patient's current relationship with siblings: Good relationship with sister Did patient suffer any verbal/emotional/physical/sexual abuse as a child?: No Did patient suffer from severe childhood neglect?: No Has patient ever been sexually abused/assaulted/raped as an adolescent or adult?: No Was the patient ever a victim of a crime or a disaster?:  No Witnessed domestic violence?: No Has patient been effected by domestic violence as an adult?: No  Education:  Currently a Consulting civil engineerstudent?: No Learning disability?: No  Employment/Work Situation:   Employment situation: On disability Why is patient on disability: Mental Health How long has patient been on disability: 1 year Patient's job has been impacted by current illness: No What is the longest time patient has a held a job?: 13 years 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 Has patient ever been in the Eli Lilly and Companymilitary?: No Has patient ever served in combat?: No Did You Receive Any Psychiatric Treatment/Services While in Equities traderthe Military?: No Are There Guns or Other Weapons in Your Home?: No  Financial Resources:   Surveyor, quantityinancial resources: Safe[employer] Corporationeceives SSDI, [place]ceives SSI, Medicaid, Medicare Does patient have a representative payee or guardian?: No  Alcohol/Substance Abuse:   What has been your use of drugs/alcohol within the last 12 months?: Patient has been using THC and crack. However he states that he has not used since [name] admission earlier this month.  If attempted suicide, did drugs/alcohol play a role in this?: No Alcohol/Substance Abuse Treatment Hx: Past Tx, Inpatient If yes, describe treatment: [facility], [facility], [facility] Has alcohol/substance abuse ever caused legal problems?: No  Social Support System:   Forensic psychologistatient's Community Support System: Fair Museum/gallery exhibitions officerDescribe Community Support System: Mother Type of faith/religion: None  Leisure/Recreation:   Leisure and Hobbies: Watch TV  Strengths/Needs:   What things does the patient do well?: Kind to others In what areas does patient struggle / problems for patient: Drug addiction and depression   Discharge Plan:   Does patient have access to transportation?: No Plan for no access to transportation at discharge: Patient states that if he does not get into  a new group home he would like help getting to  [place] Will patient be returning to same living situation after discharge?: No Plan for living situation after discharge: Patient states he does not want to [facility] and wants to go to [place]. Currently receiving community mental health services: No If no, would patient like referral for services when discharged?: Yes (What county?) (Patient [place] [facility] [place] [facility] [place] Does patient have financial barriers related to discharge medications?: No  Summary/Recommendations:   Summary and Recommendations (to be completed by the evaluator): Patient is a 55 year old male who presented to the hospital with suicidal ideation. Patient reports primary triggers for admission was complicated grief. Patient will benefit from crisis stabilization medication evaluation, group therapy and psychoeducation in addition to case management for discharge planning. At discharge, it is recommended that patient remain compliant with established discharge plan and continued treatment.  Beverly Sessionsywan J Orvilla Vasquez. 03/15/2016

## 2016-03-15 NOTE — Progress Notes (Signed)
Data. Patient reports SI on his self assessment, but denies it verbally, when questioned. Patient is able to verbally contract for safety, while on the unit and to come to staff if he begins to feel unsafe. Patient denies HI/AVH.Affect s blunted/depressed and his mood is depressed/anxious.  Patient interacting well with staff and other patients.On self inventory he reports 10/10 for all areas. His goal today is: "Get better". Action. Emotional support and encouragement offered. Education provided on medication, indications and side effect. Q 15 minute checks done for safety. Response. Safety on the unit maintained through 15 minute checks.  Medications taken as prescribed. Attended groups. Remained calm and appropriate through out shift.

## 2016-03-15 NOTE — H&P (Signed)
Psychiatric Admission Assessment Adult  Patient Identification: Joseph LefortReginald Vasquez MRN:  [id number] Date of Evaluation:  03/15/2016 Chief Complaint:  Schizoaffective Bipolar type Principal Diagnosis: Schizoaffective disorder, bipolar type (HCC) Diagnosis:   Patient Active Problem List   Diagnosis Date Noted  . Schizoaffective disorder (HCC) [F25.9] 03/14/2016  . MDD (major depressive disorder), recurrent severe, without psychosis (HCC) [F33.2] 03/13/2016  . DKA (diabetic ketoacidoses) (HCC) [E13.10] 02/28/2016  . Chest pain [R07.9] 02/28/2016  . AKI (acute kidney injury) (HCC) [N17.9] 02/28/2016  . Suicidal ideation [R45.851] 02/06/2016  . Cannabis use disorder, moderate, dependence (HCC) [F12.20] 02/06/2016  . Schizoaffective disorder, bipolar type (HCC) [F25.0] 02/05/2016  . Hyperlipidemia [E78.5] 01/18/2015  . Hyperprolactinemia (HCC) [E22.1] 01/18/2015  . Diabetes mellitus (HCC) [E11.9] 03/11/2012  . HTN (hypertension) [I10] 03/11/2012   History of Present Illness:  Information was obtained from the patient and the TTS assessment. The patient has a long history of depression, mood instability and substance use with multiple psychiatric admissions and medication trials. During his last hospitalization (03/04/2016) Zoloft and Abilify was switched to Effexor and Geodon however the patient did not take it consistently. He has a history of non compliance with medication and poor control of his diabetes which subsequently lead to another hospitalization on the medical floor prior to admission to psychiatry. He reports trying to overdose on insulin during that admission. The patient became increasingly depressed with poor sleep, decreased appetite, anhedonia, feeling of guilt of energy and concentration, crying spells and social isolation. He started experiencing suicidal ideation.  He reports panic attacks, social anxiety and PTSD type of symptoms. There is no OCD. He denies alcohol use but admits  to smoking marijuana. He reports he stayed with a buddy for 4 days and there was a lot of drug use but he did not use. He notes that this was not a safe environment for him as there were guns everyone and he grabbed one of the guns and tried to shoot himself twice but it jammed. He became guilty and called EMS.   Past psychiatric history: Schizoaffective, Suicidal ideation, Suicide attempt, Substance use disorder.  Prior Inpatient Therapy:  [facility] for both depression, substance use, suicidal ideations, and mania. Previous medications: Lithium, Depakote, Tegretol, Seroquel Geodon, Effexor, Zoloft , Abilify and Risperdal.  Prior Outpatient Therapy:  Last time he reported two suicide attempts by cutting and overdose.  He attempted substance abuse treatment several times. [place] program for 4 months until January 2017, which he completed successfully and then he reports his brother passed away.   Upon today's evaluation: [name] is a 55 year old male with history of depression, mood instability, and substance abuse who was transferred from ED to OBS for crisis stabilization. He continues to endorse suicidal ideations and auditory hallucinations. He currently meets criteria for inpatient admission and medication management for suicidal ideation the context of treatment noncompliance and severe social stressors including housing, food, grief, and medication compliance. During the evaluation patient remained oriented, calm and cooperative, there was no evidence of thought blocking, paranoia, hallucinations or response to internal stimuli. He presents with clear thought process and logical thinking. Suspect patient is malingering to receive services, history of multiple ED visits and admissions for the like he is familiar with inpatient requirements.   Family psychiatric history. Sister with  schizophrenia.  Total Time spent with patient: 1 hour  Is the patient at risk to  self? Yes.    Has the patient been a risk to self in the past 6 months? Yes.    Has the patient been a risk to self within the distant past? No.  Is the patient a risk to others? No.  Has the patient been a risk to others in the past 6 months? No.  Has the patient been a risk to others within the distant past? No.    Alcohol Screening: 1. How often do you have a drink containing alcohol?: Never 2. How many drinks containing alcohol do you have on a typical day when you are drinking?: 1 or 2 3. How often do you have six or more drinks on one occasion?: Never Preliminary Score: 0 9. Have you or someone else been injured as a result of your drinking?: No 10. Has a relative or friend or a doctor or another health worker been concerned about your drinking or suggested you cut down?: No Alcohol Use Disorder Identification Test Final Score (AUDIT): 0 Brief Intervention: AUDIT score less than 7 or less-screening does not suggest unhealthy drinking-brief intervention not indicated Substance Abuse History in the last 12 months:  Yes.   Consequences of Substance Abuse: Negative Previous Psychotropic Medications: Yes  Psychological Evaluations: No  Past Medical History:  Past Medical History:  Diagnosis Date  . Anxiety   . Bipolar affective disorder (HCC)   . Bipolar disorder (HCC) 03/11/2012  . Depression   . Diabetes mellitus   . Diabetes mellitus (HCC) 03/11/2012  . Hepatitis 06/30/2013   Type C  . Hypertension   . Schizophrenia, schizo-affective (HCC)     Past Surgical History:  Procedure Laterality Date  . FINGER SURGERY    . WISDOM TOOTH EXTRACTION     Family History:  Family History  Problem Relation Age of Onset  . Diabetes Mother   . Bipolar disorder Sister    Tobacco Screening: Have you used any form of tobacco in the last 30 days? (Cigarettes, Smokeless Tobacco, Cigars, and/or Pipes):  No Social History:  History  Alcohol Use No     History  Drug Use  . Types: Marijuana, Cocaine    Comment: 4 days ago last THC and cocoaine use... relapsed after 2 yrs    Social history. He is disabled from mental illness. He is originally from [place]. He used to live with his friend but is no longer possible that he is homeless now. The patient again asks to be placed in a group home but last admission he change his mind at the last moment.  Allergies:   Allergies  Allergen Reactions  . Wellbutrin [Bupropion] Hives and Swelling   Lab Results:  Results for orders placed or performed during the hospital encounter of 03/13/16 (from the past 48 hour(s))  Glucose, capillary     Status: Abnormal   Collection Time: 03/13/16  7:52 PM  Result Value Ref Range   Glucose-Capillary 228 (H) 65 - 99 mg/dL  Glucose, capillary     Status: Abnormal   Collection Time: 03/14/16  6:42 AM  Result Value Ref Range   Glucose-Capillary 124 (H) 65 - 99 mg/dL  Glucose, capillary     Status: None   Collection Time: 03/14/16 11:35 AM  Result Value Ref Range   Glucose-Capillary 90 65 - 99 mg/dL  Glucose, capillary     Status: None   Collection Time: 03/14/16  5:34 PM  Result Value Ref Range   Glucose-Capillary 95 65 - 99 mg/dL  Glucose,  capillary     Status: Abnormal   Collection Time: 03/15/16  6:08 AM  Result Value Ref Range   Glucose-Capillary 165 (H) 65 - 99 mg/dL    Blood Alcohol level:  Lab Results  Component Value Date   ETH <5 03/12/2016   ETH <5 03/12/2016    Metabolic Disorder Labs:  Lab Results  Component Value Date   HGBA1C 11.8 (H) 02/08/2016   MPG 275 08/03/2015   MPG 295 01/17/2015   Lab Results  Component Value Date   PROLACTIN 3.4 (L) 02/08/2016   PROLACTIN 26.5 (H) 01/17/2015   Lab Results  Component Value Date   CHOL 213 (H) 02/08/2016   TRIG 127 02/08/2016   HDL 42 02/08/2016   CHOLHDL 5.1 02/08/2016   VLDL 25 02/08/2016   LDLCALC 146 (H) 02/08/2016    LDLCALC 183 (H) 08/03/2015    Current Medications: Current Facility-Administered Medications  Medication Dose Route Frequency Provider Last Rate Last Dose  . acetaminophen (TYLENOL) tablet 650 mg  650 mg Oral Q6H PRN Truman Haywardakia S Starkes, FNP   650 mg at 03/15/16 0846  . alum & mag hydroxide-simeth (MAALOX/MYLANTA) 200-200-20 MG/5ML suspension 30 mL  30 mL Oral Q4H PRN Beau FannyJohn C Withrow, FNP      . alum & mag hydroxide-simeth (MAALOX/MYLANTA) 200-200-20 MG/5ML suspension 30 mL  30 mL Oral Q4H PRN Truman Haywardakia S Starkes, FNP      . glipiZIDE (GLUCOTROL) tablet 10 mg  10 mg Oral BID AC Nelly RoutArchana Kumar, MD   10 mg at 03/15/16 [id number]  . hydrOXYzine (ATARAX/VISTARIL) tablet 25 mg  25 mg Oral TID PRN Beau FannyJohn C Withrow, FNP      . hydrOXYzine (ATARAX/VISTARIL) tablet 25 mg  25 mg Oral TID PRN Truman Haywardakia S Starkes, FNP      . insulin aspart (novoLOG) injection 0-15 Units  0-15 Units Subcutaneous TID WC Beau FannyJohn C Withrow, FNP   3 Units at 03/15/16 [phone]  . insulin detemir (LEVEMIR) injection 20 Units  20 Units Subcutaneous QHS Beau FannyJohn C Withrow, FNP   20 Units at 03/14/16 2241  . insulin detemir (LEVEMIR) injection 25 Units  25 Units Subcutaneous Daily Beau FannyJohn C Withrow, FNP   25 Units at 03/14/16 0912  . magnesium hydroxide (MILK OF MAGNESIA) suspension 30 mL  30 mL Oral Daily PRN Beau FannyJohn C Withrow, FNP      . magnesium hydroxide (MILK OF MAGNESIA) suspension 30 mL  30 mL Oral Daily PRN Truman Haywardakia S Starkes, FNP      . metFORMIN (GLUCOPHAGE) tablet 1,000 mg  1,000 mg Oral BID WC Nelly RoutArchana Kumar, MD   1,000 mg at 03/15/16 0845  . simvastatin (ZOCOR) tablet 40 mg  40 mg Oral q1800 Beau FannyJohn C Withrow, FNP   40 mg at 03/14/16 1740  . venlafaxine XR (EFFEXOR-XR) 24 hr capsule 150 mg  150 mg Oral Q breakfast Beau FannyJohn C Withrow, FNP   150 mg at 03/15/16 0845  . ziprasidone (GEODON) capsule 40 mg  40 mg Oral BID WC Beau FannyJohn C Withrow, FNP      . ziprasidone (GEODON) capsule 40 mg  40 mg Oral BID WC Beau FannyJohn C Withrow, FNP   40 mg at 03/15/16 0844  . zolpidem (AMBIEN)  tablet 5 mg  5 mg Oral QHS PRN Beau FannyJohn C Withrow, FNP       PTA Medications: Prescriptions Prior to Admission  Medication Sig Dispense Refill Last Dose  . acetaminophen (TYLENOL) 325 MG tablet Take 325-650 mg by mouth every 6 (six) hours as  needed (for back pain).   03/11/2016 at Unknown time  . aspirin EC 325 MG tablet Take 325 mg by mouth daily as needed for mild pain.   Past Month at Unknown time  . glipiZIDE (GLUCOTROL) 10 MG tablet Take 1 tablet (10 mg total) by mouth 2 (two) times daily before a meal. 60 tablet 1 03/11/2016  . ibuprofen (ADVIL,MOTRIN) 200 MG tablet Take 200-400 mg by mouth every 6 (six) hours as needed (for back pain).   03/11/2016  . insulin aspart (NOVOLOG) 100 UNIT/ML injection Inject 0-15 Units into the skin 3 (three) times daily with meals. (Patient taking differently: Inject 0-15 Units into the skin 3 (three) times daily with meals. Per sliding scale) 10 mL 11 03/11/2016  . insulin detemir (LEVEMIR) 100 UNIT/ML injection Inject 0.27 mLs (27 Units total) into the skin 2 (two) times daily. (Patient taking differently: Inject 20-27 Units into the skin See admin instructions. 25-27 units in the morning and 20 units at bedtime) 10 mL 11 03/11/2016  . metFORMIN (GLUCOPHAGE) 1000 MG tablet Take 1 tablet (1,000 mg total) by mouth 2 (two) times daily with a meal. For diabetes management 60 tablet 1 03/11/2016  . nitroGLYCERIN (NITROSTAT) 0.4 MG SL tablet Place 1 tablet (0.4 mg total) under the tongue every 5 (five) minutes as needed for chest pain (CP or SOB). 30 tablet 1 unknown  . simvastatin (ZOCOR) 40 MG tablet Take 1 tablet (40 mg total) by mouth daily at 6 PM. 30 tablet 1 03/11/2016  . venlafaxine XR (EFFEXOR-XR) 150 MG 24 hr capsule Take 1 capsule (150 mg total) by mouth daily with breakfast. 30 capsule 1 03/11/2016  . ziprasidone (GEODON) 40 MG capsule Take 1 capsule (40 mg total) by mouth 2 (two) times daily with a meal. 60 capsule 1 03/11/2016 at Unknown time  . zolpidem (AMBIEN) 5  MG tablet Take 1 tablet (5 mg total) by mouth at bedtime as needed for sleep. 30 tablet 1 03/10/2016  . Insulin Syringe-Needle U-100 30G 0.5 ML MISC As directed 100 each 0     Musculoskeletal: Strength & Muscle Tone: within normal limits Gait & Station: normal Patient leans: N/A  Psychiatric Specialty Exam: I reviewed physical examination performed on the medical floor and agree with the findings. Physical Exam  Nursing note and vitals reviewed. Constitutional: He appears well-developed and well-nourished.  HENT:  Head: Normocephalic.  Eyes: Pupils are equal, round, and reactive to light.  Neck: Normal range of motion.  Psychiatric: His speech is normal and behavior is normal. Judgment and thought content normal. His mood appears not anxious. Cognition and memory are normal. He exhibits a depressed mood.  Reports AVH "hurt myself and others"     Review of Systems  Psychiatric/Behavioral: Positive for depression, hallucinations, substance abuse and suicidal ideas. Negative for memory loss. The patient has insomnia. The patient is not nervous/anxious.   All other systems reviewed and are negative.   Blood pressure 117/78, pulse 65, temperature 98.8 F (37.1 C), resp. rate 20, height 5\' 8"  (1.727 m), weight 93 kg (205 lb), SpO2 100 %.Body mass index is 31.17 kg/m.  General Appearance: casual, fairly groomed in paper scrubs  Eye Contact:  Good  Speech:  Normal rate, clear, coherent  Volume:  normal  Mood:  depressed  Affect:  Congruent, Appropriate  Thought Process:  Coherent and Goal Directed  Orientation:  Full (Time, Place, and Person)  Thought Content:  Focused on coming inpatient "malingering". Positive for AVH.   Suicidal  Thoughts:  Yes.  with intent/plan stating he wants to shoot himself; he denied access to guns 24 hours prior and was [facility]   Homicidal Thoughts:  No  Memory:  Immediate;   Fair Recent;   Fair  Judgement:  Fair  Insight:  Fair  Psychomotor  Activity:  Restlessness  Concentration:  Concentration: Fair and Attention Span: Fair  Recall:  [employer] of Knowledge:  Good  Language:  Good  Akathisia:  No  Handed:  Right  AIMS (if indicated):     Assets:  Communication Skills Desire for Improvement Financial Resources/Insurance Leisure Time Resilience Social Support  ADL's:  Intact  Cognition:  WNL  Sleep:        Treatment Plan Summary: Daily contact with patient to assess and evaluate symptoms and progress in treatment and Medication management     1 Admit for crisis management and stabilization. Estimated length of stay 5-7 days past his current stay of 1. Patient was in OBS unit from 08/17-08/18, continued to report hallucinations and suicidal thoughts was transferred to inpatient.  2 Individual and group therapy. 3 Medication management for depression, psychosis and anxiety to reduce current symptoms to base line and improve the overall levels of functioning: Medications reviewed with the patient and he stated no untoward effects, home medications in place.  4 Coping skills for depression and anxiety developing.  5 Continue crisis stabilization and management.  6 Address health issues- monitor vital signs, stable; POCT--endocrine consult has been obtained, orders placed for insulin therapy. 7 Treatment plan in progress to prevent relapse prevention and self care.  8 Psychosocial education regarding relapse prevention and self care 9 Heath care follow up as needed for any health concerns 10 Call for consult with hospitalist for additional specialty patient services as needed.   Suicidal ideation. The patient is able to contract for safety in the hospital only. He reports Auditory hallucinations reporting to harm himself and others, he does not appear to be responding to internal stimuli at this time.  Mood. We restarted Effexor for depression and Geodon for mood stabiization and psychosis. Substance abuse treatment. He is  positive for cannabinoids.   Disposition: The patient wants to be placed in a group home or assisted living. He is noting that he needs help with his diabetes management and other medical conditions. SW to assist.    Observation Level/Precautions:  15 minute checks  Laboratory:  CBC Chemistry Profile UDS UA  Psychotherapy:  Individual therapy  Medications:  Restart home medications. Will restart Effexor and Geodon for crisis stabilization.  Labs ordered on previous admission, please review.   Consultations:  Per need  Discharge Concerns:  Drug use, shelter, and medication compliance  Estimated LOS:5-7 days  Other:     I certify that inpatient services furnished can reasonably be expected to improve the patient's condition.    Truman Hayward, FNP 8/19/20178:48 AM

## 2016-03-16 LAB — GLUCOSE, CAPILLARY
GLUCOSE-CAPILLARY: 230 mg/dL — AB (ref 65–99)
Glucose-Capillary: 157 mg/dL — ABNORMAL HIGH (ref 65–99)
Glucose-Capillary: 320 mg/dL — ABNORMAL HIGH (ref 65–99)
Glucose-Capillary: 190 mg/dL — ABNORMAL HIGH (ref 65–99)

## 2016-03-16 NOTE — [facility] Group Notes (Signed)
[facility] Group Notes:  (Nursing/MHT/Case Management/Adjunct)  Date:  03/16/2016  Time:  1030  Type of Therapy:  Nurse Education - Healthy Support Systems  Participation Level:  Minimal  Participation Quality:  Attentive  Affect:  Flat  Cognitive:  Alert  Insight:  Limited  Engagement in Group:  Limited  Modes of Intervention:  Discussion, Education and Support  Summary of Progress/Problems: Patient attended group, listened however did not contribute to the discussion.  Merian CapronFriedman, Brenen Beigel [facility] 03/16/2016, 1100

## 2016-03-16 NOTE — Progress Notes (Signed)
[facility]  03/16/2016 9:46 AM Joseph LefortReginald Vasquez  MRN:  [id number]  Subjective:  "I feel a little bit alright at this time. Im rather anxious about my new home, and I'm homeless and have medical needs. I am feeling down this morning. Yesterday was a god day I was just trying to stay busy."   Objective: Patient  has been compliant with his medication and inpatient psychiatric program including milieu therapy and group therapy. Patient  has a disturbance of sleep and appetite. Patient has rates depression  7/10 and anxiety 7/10 and has suicidal ideation without intention or plan at this time. Patient endorse passive SI and increased depression at this time, however Geologist, engineeringyesterday writer observed patient laughing, joking with his peers, conversing with family members on the phone several times during the day but he denies having any support. Patient has stressed out about his current living situation and is anxious about finding a new place to live preferably a "group home". Discussed with patient that the hospital is for acute and crisis stabilization, we are able to provide him a list of places to go however he has to take some initiative in finding a new home. He was recently discharged less than two weeks ago, and was sent to an independent living home which he did not like and chose to leave, therefore he began to endorse suicidality and hallucinations to warrant admission to the facility. Patient verbalizes understanding and agrees to call some places today, upon provided the resources. Patient stated that he is feeling safer in the hospital contract for safety while in the hospital.  Principal Problem: Schizoaffective disorder, bipolar type [facility](HCC) Diagnosis:   Patient Active Problem List   Diagnosis Date Noted  . DKA (diabetic ketoacidoses) (HCC) [E13.10] 02/28/2016  . Chest pain [R07.9] 02/28/2016  . AKI (acute kidney injury) (HCC) [N17.9] 02/28/2016  . Suicidal ideation [R45.851] 02/06/2016  .  Cannabis use disorder, moderate, dependence (HCC) [F12.20] 02/06/2016  . Schizoaffective disorder, bipolar type (HCC) [F25.0] 02/05/2016  . Hyperlipidemia [E78.5] 01/18/2015  . Hyperprolactinemia (HCC) [E22.1] 01/18/2015  . Diabetes mellitus (HCC) [E11.9] 03/11/2012  . HTN (hypertension) [I10] 03/11/2012   Total Time spent with patient: 20 minutes  Past Psychiatric History: depression, substance use.  Past Medical History:  Past Medical History:  Diagnosis Date  . Anxiety   . Bipolar affective disorder (HCC)   . Bipolar disorder (HCC) 03/11/2012  . Depression   . Diabetes mellitus   . Diabetes mellitus (HCC) 03/11/2012  . Hepatitis 06/30/2013   Type C  . Hypertension   . Schizophrenia, schizo-affective (HCC)     Past Surgical History:  Procedure Laterality Date  . FINGER SURGERY    . WISDOM TOOTH EXTRACTION     Family History:  Family History  Problem Relation Age of Onset  . Diabetes Mother   . Bipolar disorder Sister    Family Psychiatric  History: see H&P. Social History:  History  Alcohol Use No     History  Drug Use  . Types: Marijuana, Cocaine    Comment: 4 days ago last THC and cocoaine use... relapsed after 2 yrs     Social History   Social History  . Marital status: Single    Spouse name: N/A  . Number of children: N/A  . Years of education: N/A   Social History Main Topics  . Smoking status: Never Smoker  . Smokeless tobacco: Never Used  . Alcohol use No  . Drug use:  Types: Marijuana, Cocaine     Comment: 4 days ago last THC and cocoaine use... relapsed after 2 yrs   . Sexual activity: Yes    Birth control/ protection: Condom     Comment: marijuana 25 days ago   Other Topics Concern  . None   Social History Narrative  . None   Additional Social History:    Pain Medications: Pt denies.  Prescriptions: Pt denies. Over the Counter: Pt denies.  History of alcohol / drug use?: Yes Longest period of sobriety (when/how long): 21  days. Negative Consequences of Use: Financial, Personal relationships, Work / School Name of Substance 1: Marijuana  1 - Age of First Use: UTA 1 - Amount (size/oz): UTA 1 - Frequency: UTA 1 - Duration: UTA 1 - Last Use / Amount: 21 days ago  Name of Substance 2: Crack  2 - Age of First Use: UTA 2 - Amount (size/oz): UTA 2 - Frequency: UTA 2 - Duration: UTA 2 - Last Use / Amount: 21 days ago.                Sleep: Fair  Appetite:  Fair  Current Medications: Current Facility-Administered Medications  Medication Dose Route Frequency Provider Last Rate Last Dose  . acetaminophen (TYLENOL) tablet 650 mg  650 mg Oral Q6H PRN Truman Hayward, FNP   650 mg at 03/15/16 0846  . alum & mag hydroxide-simeth (MAALOX/MYLANTA) 200-200-20 MG/5ML suspension 30 mL  30 mL Oral Q4H PRN Beau Fanny, FNP      . alum & mag hydroxide-simeth (MAALOX/MYLANTA) 200-200-20 MG/5ML suspension 30 mL  30 mL Oral Q4H PRN Truman Hayward, FNP      . glipiZIDE (GLUCOTROL) tablet 10 mg  10 mg Oral BID AC Nelly Rout, MD   10 mg at 03/16/16 1610  . hydrOXYzine (ATARAX/VISTARIL) tablet 25 mg  25 mg Oral TID PRN Beau Fanny, FNP      . hydrOXYzine (ATARAX/VISTARIL) tablet 25 mg  25 mg Oral TID PRN Truman Hayward, FNP      . insulin aspart (novoLOG) injection 0-15 Units  0-15 Units Subcutaneous TID WC Beau Fanny, FNP   5 Units at 03/16/16 9604  . insulin detemir (LEVEMIR) injection 20 Units  20 Units Subcutaneous QHS Beau Fanny, FNP   20 Units at 03/15/16 2126  . insulin detemir (LEVEMIR) injection 25 Units  25 Units Subcutaneous Daily Beau Fanny, FNP   25 Units at 03/16/16 0840  . magnesium hydroxide (MILK OF MAGNESIA) suspension 30 mL  30 mL Oral Daily PRN Beau Fanny, FNP      . magnesium hydroxide (MILK OF MAGNESIA) suspension 30 mL  30 mL Oral Daily PRN Truman Hayward, FNP      . metFORMIN (GLUCOPHAGE) tablet 1,000 mg  1,000 mg Oral BID WC Nelly Rout, MD   1,000 mg at 03/16/16 0839   . simvastatin (ZOCOR) tablet 40 mg  40 mg Oral q1800 Beau Fanny, FNP   40 mg at 03/15/16 1859  . venlafaxine XR (EFFEXOR-XR) 24 hr capsule 150 mg  150 mg Oral Q breakfast Beau Fanny, FNP   150 mg at 03/16/16 5409  . ziprasidone (GEODON) capsule 40 mg  40 mg Oral BID WC Beau Fanny, FNP   40 mg at 03/16/16 8119  . zolpidem (AMBIEN) tablet 5 mg  5 mg Oral QHS PRN Beau Fanny, FNP   5 mg at 03/15/16 2117  Lab Results:  Results for orders placed or performed during the hospital encounter of 03/13/16 (from the past 48 hour(s))  Glucose, capillary     Status: None   Collection Time: 03/14/16 11:35 AM  Result Value Ref Range   Glucose-Capillary 90 65 - 99 mg/dL  Glucose, capillary     Status: None   Collection Time: 03/14/16  5:34 PM  Result Value Ref Range   Glucose-Capillary 95 65 - 99 mg/dL  Glucose, capillary     Status: Abnormal   Collection Time: 03/15/16  6:08 AM  Result Value Ref Range   Glucose-Capillary 165 (H) 65 - 99 mg/dL  Glucose, capillary     Status: Abnormal   Collection Time: 03/15/16 12:04 PM  Result Value Ref Range   Glucose-Capillary 203 (H) 65 - 99 mg/dL  Glucose, capillary     Status: Abnormal   Collection Time: 03/15/16  4:58 PM  Result Value Ref Range   Glucose-Capillary 194 (H) 65 - 99 mg/dL   Comment 1 Notify RN    Comment 2 Document in Chart   Glucose, capillary     Status: Abnormal   Collection Time: 03/15/16  8:37 PM  Result Value Ref Range   Glucose-Capillary 203 (H) 65 - 99 mg/dL   Comment 1 Notify RN   Glucose, capillary     Status: Abnormal   Collection Time: 03/16/16  5:59 AM  Result Value Ref Range   Glucose-Capillary 230 (H) 65 - 99 mg/dL    Blood Alcohol level:  Lab Results  Component Value Date   ETH <5 03/12/2016   ETH <5 03/12/2016    Metabolic Disorder Labs: Lab Results  Component Value Date   HGBA1C 11.8 (H) 02/08/2016   MPG 275 08/03/2015   MPG 295 01/17/2015   Lab Results  Component Value Date   PROLACTIN  3.4 (L) 02/08/2016   PROLACTIN 26.5 (H) 01/17/2015   Lab Results  Component Value Date   CHOL 213 (H) 02/08/2016   TRIG 127 02/08/2016   HDL 42 02/08/2016   CHOLHDL 5.1 02/08/2016   VLDL 25 02/08/2016   LDLCALC 146 (H) 02/08/2016   LDLCALC 183 (H) 08/03/2015    Physical Findings: AIMS: Facial and Oral Movements Muscles of Facial Expression: None, normal Lips and Perioral Area: None, normal Jaw: None, normal Tongue: None, normal,Extremity Movements Upper (arms, wrists, hands, fingers): None, normal Lower (legs, knees, ankles, toes): None, normal, Trunk Movements Neck, shoulders, hips: None, normal, Overall Severity Severity of abnormal movements (highest score from questions above): None, normal Incapacitation due to abnormal movements: None, normal Patient's awareness of abnormal movements (rate only patient's report): No Awareness, Dental Status Current problems with teeth and/or dentures?: No Does patient usually wear dentures?: No  CIWA:    COWS:     Musculoskeletal: Strength & Muscle Tone: within normal limits Gait & Station: normal Patient leans: N/A  Psychiatric Specialty Exam: Physical Exam  Nursing note and vitals reviewed.   Review of Systems  Psychiatric/Behavioral: Positive for depression, substance abuse and suicidal ideas.  All other systems reviewed and are negative.   Blood pressure 109/82, pulse 77, temperature 98.8 F (37.1 C), resp. rate 20, height 5\' 8"  (1.727 m), weight 93 kg (205 lb), SpO2 100 %.Body mass index is 31.17 kg/m.  General Appearance: Casual  Eye Contact:  Good  Speech:  Clear and Coherent  Volume:  Normal  Mood:  Anxious and reports depression, observation of patient in milieu reflects otherwise.  Affect:  Congruent, Depressed  and Flat  Thought Process:  Goal Directed  Orientation:  Full (Time, Place, and Person)  Thought Content:  WDL  Suicidal Thoughts:  Yes.  without intent/plan  Homicidal Thoughts:  No  Memory:   Immediate;   Fair Recent;   Fair Remote;   Fair  Judgement:  Poor  Insight:  Lacking  Psychomotor Activity:  Normal  Concentration:  Concentration: Fair and Attention Span: Fair  Recall:  FiservFair  Fund of Knowledge:  Fair  Language:  Fair  Akathisia:  No  Handed:  Right  AIMS (if indicated):     Assets:  Communication Skills Desire for Improvement Financial Resources/Insurance Resilience Social Support  ADL's:  Intact  Cognition:  WNL  Sleep:  Number of Hours: 6.75    Treatment Plan Summary: Daily contact with patient to assess and evaluate symptoms and progress in treatment and Medication management   [name] is a 55 year old male with history of depression, mood instability, malingering and substance abuse who has multiple admissions for the like. He has a history treatment noncompliance and severe social stressors to including chronic disease, homeless, and unable to take care of himself. He is wanting to find resources for a group home, however due to his history would recommend skilled facility to help manage and diabetes which are poorly controlled.   1. Suicidal ideation. The patient is able to contract for safety in the hospital.  2. Mood. We restarted Effexor for depression and Geodon for mood stabiization and psychosis. 3. Diabetes. He is on glipizide, metformin, insulin, ADA diet, blood glucose monitoring and sliding insulin.  4. Dyslipidemia. He is on Zocor. 5. Insomnia. He is on Ambien. 6. Substance abuse treatment. He is positive for cannabinoids.  7. Metabolic syndrome screening. Lab performed during previous admission. Lipid panel shows elevated cholesterol. TSH is normal. Hemoglobin A1c 11.8, prolactin 3.4. 8. Disposition. The patient wants to be placed in a group home as noted above, consider skilled rehab for management of chronic conditions then transition to group home. Pt unable to take responsibility for his care and actions, will require more medical  assistance. He is focused on being placed, if unable to be placed [place] and he hears [place] help seniors over the age of 55.   Truman Haywardakia S Starkes, FNP 03/16/2016, 9:46 AM

## 2016-03-16 NOTE — [facility] Group Notes (Signed)
[facility] Group Notes: (Clinical Social Work)   03/16/2016      Type of Therapy:  Group Therapy   Participation Level:  Did Not Attend despite MHT prompting   Ambrose MantleMareida Grossman-Orr, LCSW 03/16/2016, 4:38 PM

## 2016-03-16 NOTE — Progress Notes (Signed)
D: Patient up and visible in the milieu. Spoke with patient 1:1. Rates sleep fair, appetite poor, energy low and concentration poor. Patient's affect flat, mood irritable. Rating his depression at a 7/10, hopelessness at an 8/10 and anxiety at a 10/10. States goal for today is "placement." Reports hearing AH telling him he is worthless.   A: Medicated per orders, no prn meds given or requested. Emotional support offered and self inventory reviewed.    R: Patient verbalizes understanding. Patient endorsing passive SI but verbally contracts for safety. Denies plan, intent. Denies HI and remains safe on level III obs.

## 2016-03-16 NOTE — Progress Notes (Signed)
D: Patient seen watching TV on dayroom. No interaction. brightened up upon approach. Denies pain, SI,AH/VH. Endorses depression of 6/10. CBG at bedtime was 203 mg/dl. No behavioral issues noted.  Staff offered support and encouraged patient to continue with the treatment plan. Due meds given as ordederd. Every 15 minutes check for safety and stability. Will continue to monitor patient for safety and stability.  Patient sleeping at this time.

## 2016-03-16 NOTE — Plan of Care (Signed)
Problem: Safety: Goal: Periods of time without injury will increase Outcome: Progressing Patient endorses passive SI however verbally contracts for safety. No self harm on unit.  Problem: Medication: Goal: Compliance with prescribed medication regimen will improve Outcome: Progressing Patient is med compliant.

## 2016-03-16 NOTE — Progress Notes (Signed)
[facility] Group Notes:  (Nursing/MHT/Case Management/Adjunct)  Date:  03/16/2016  Time:  11:12 PM  Type of Therapy:  Psychoeducational Skills  Participation Level:  Minimal  Participation Quality:  Resistant  Affect:  Flat  Cognitive:  Lacking  Insight:  Limited  Engagement in Group:  Limited  Modes of Intervention:  Education  Summary of Progress/Problems: The patient stated that he felt sluggish today but did not go into further detail regarding his day. In terms of the theme for the day, his support system will be comprised of his mother, sister, and daughter.   [name] S 03/16/2016, 11:12 PM

## 2016-03-16 NOTE — Progress Notes (Signed)
Psychoeducational Group Note  Date:  03/16/2016 Time:  0124  Group Topic/Focus:  Wrap-Up Group:   The focus of this group is to help patients review their daily goal of treatment and discuss progress on daily workbooks.   Participation Level: Did Not Attend  Participation Quality:  Not Applicable  Affect:  Not Applicable  Cognitive:  Not Applicable  Insight:  Not Applicable  Engagement in Group: Not Applicable  Additional Comments:  The patient did not attend last evening's group since he was asleep in his room.    Maudene Stotler S 03/16/2016, 1:24 AM

## 2016-03-17 DIAGNOSIS — F250 Schizoaffective disorder, bipolar type: Principal | ICD-10-CM

## 2016-03-17 LAB — GLUCOSE, CAPILLARY
GLUCOSE-CAPILLARY: 412 mg/dL — AB (ref 65–99)
Glucose-Capillary: 200 mg/dL — ABNORMAL HIGH (ref 65–99)
Glucose-Capillary: 194 mg/dL — ABNORMAL HIGH (ref 65–99)
Glucose-Capillary: 269 mg/dL — ABNORMAL HIGH (ref 65–99)

## 2016-03-17 MED ORDER — INSULIN DETEMIR 100 UNIT/ML ~~LOC~~ SOLN
26.0000 [IU] | Freq: Every day | SUBCUTANEOUS | Status: DC
  Administered 2016-03-17 – 2016-03-23 (×7): 26 [IU] via SUBCUTANEOUS

## 2016-03-17 MED ORDER — INSULIN ASPART 100 UNIT/ML ~~LOC~~ SOLN
22.0000 [IU] | Freq: Once | SUBCUTANEOUS | Status: AC
  Administered 2016-03-17: 22 [IU] via SUBCUTANEOUS

## 2016-03-17 MED ORDER — ZOLPIDEM TARTRATE 10 MG PO TABS
10.0000 mg | ORAL_TABLET | Freq: Every evening | ORAL | Status: DC | PRN
  Administered 2016-03-19 – 2016-03-23 (×4): 10 mg via ORAL
  Filled 2016-03-17 (×4): qty 1

## 2016-03-17 NOTE — Plan of Care (Signed)
Problem: Education: Goal: Will be free of psychotic symptoms Outcome: Not Progressing Patient continues to endorse command hallucinations that are telling him to hurt/kill imself. He reports that he has a hart time, at different times throughout the day, in ignoring these voices.

## 2016-03-17 NOTE — [facility] Group Notes (Signed)
[facility] LCSW Group Therapy  03/17/2016 1:15pm  Type of Therapy:  Group Therapy vercoming Obstacles  Pt did not attend, declined invitation.   Chad CordialLauren Carter, LCSWA 03/17/2016 4:29 PM

## 2016-03-17 NOTE — Progress Notes (Addendum)
Patient ID: [name], male   DOB: [birth year], 55 y.o.   MRN: [id number] [facility] MD Progress Note  03/17/2016 1:34 PM [name]  MRN:  [id number]  Subjective:   Patient reports ongoing depression, anxiety, but states he does feel better than prior to admission . Reports intermittent auditory hallucinations telling him he is "worthless ". At this time denies medication side effects .   Objective: I have discussed case with treatment team and have met with patient . Patient is a 55 year old man, who has a history of  recent psychiatric  admissions. In the past has been diagnosed with Schizoaffective Disorder and Cannabis and Cocaine Abuse  . Patient states he has been sober x more than one month now. Patient reports ongoing depression, but states he is feeling " a little better ". Describes some ongoing passive thoughts of death, dying, but denies plan or intention of suicide and contracts for safety at this time . As above, describes auditory hallucinations . He is currently on Geodon and Effexor XR, both of which he states are new medications for him- thus far denies medication side effects. No akathisia. EKG- QTc 422 . No disruptive or agitated behaviors on unit. Going to some groups.   Principal Problem: Schizoaffective disorder, bipolar type ([place]) Diagnosis:   Patient Active Problem List   Diagnosis Date Noted  . DKA (diabetic ketoacidoses) ([place]) [E13.10] 02/28/2016  . Chest pain [R07.9] 02/28/2016  . AKI (acute kidney injury) ([place]) [N17.9] 02/28/2016  . Suicidal ideation [R45.851] 02/06/2016  . Cannabis use disorder, moderate, dependence ([place]) [F12.20] 02/06/2016  . Schizoaffective disorder, bipolar type ([place]) [F25.0] 02/05/2016  . Hyperlipidemia [E78.5] 01/18/2015  . Hyperprolactinemia ([place]) [E22.1] 01/18/2015  . Diabetes mellitus ([place]) [E11.9] 03/11/2012  . HTN (hypertension) [I10] 03/11/2012   Total Time spent with patient: 20 minutes  Past Psychiatric History: depression,  substance use.  Past Medical History:  Past Medical History:  Diagnosis Date  . Anxiety   . Bipolar affective disorder ([place])   . Bipolar disorder ([place]) 03/11/2012  . Depression   . Diabetes mellitus   . Diabetes mellitus ([place]) 03/11/2012  . Hepatitis 06/30/2013   Type C  . Hypertension   . Schizophrenia, schizo-affective ([place])     Past Surgical History:  Procedure Laterality Date  . FINGER SURGERY    . WISDOM TOOTH EXTRACTION     Family History:  Family History  Problem Relation Age of Onset  . Diabetes Mother   . Bipolar disorder Sister    Family Psychiatric  History: see H&P. Social History:  History  Alcohol Use No     History  Drug Use  . Types: Marijuana, Cocaine    Comment: 4 days ago last THC and cocoaine use... relapsed after 2 yrs     Social History   Social History  . Marital status: Single    Spouse name: N/A  . Number of children: N/A  . Years of education: N/A   Social History Main Topics  . Smoking status: Never Smoker  . Smokeless tobacco: Never Used  . Alcohol use No  . Drug use:     Types: Marijuana, Cocaine     Comment: 4 days ago last THC and cocoaine use... relapsed after 2 yrs   . Sexual activity: Yes    Birth control/ protection: Condom     Comment: marijuana 25 days ago   Other Topics Concern  . None   Social History Narrative  . None   Additional  Social History:    Pain Medications: Pt denies.  Prescriptions: Pt denies. Over the Counter: Pt denies.  History of alcohol / drug use?: Yes Longest period of sobriety (when/how long): 21 days. Negative Consequences of Use: Financial, Personal relationships, Work / School Name of Substance 1: Marijuana  1 - Age of First Use: UTA 1 - Amount (size/oz): UTA 1 - Frequency: UTA 1 - Duration: UTA 1 - Last Use / Amount: 21 days ago  Name of Substance 2: Crack  2 - Age of First Use: UTA 2 - Amount (size/oz): UTA 2 - Frequency: UTA 2 - Duration: UTA 2 - Last Use / Amount: 21 days  ago.  Sleep: Fair- complains of ongoing insomnia, in spite of Ambien   Appetite:  Fair  Current Medications: Current Facility-Administered Medications  Medication Dose Route Frequency Provider Last Rate Last Dose  . acetaminophen (TYLENOL) tablet 650 mg  650 mg Oral Q6H PRN Nanci Pina, FNP   650 mg at 03/17/16 0834  . alum & mag hydroxide-simeth (MAALOX/MYLANTA) 200-200-20 MG/5ML suspension 30 mL  30 mL Oral Q4H PRN Benjamine Mola, FNP      . alum & mag hydroxide-simeth (MAALOX/MYLANTA) 200-200-20 MG/5ML suspension 30 mL  30 mL Oral Q4H PRN Nanci Pina, FNP      . glipiZIDE (GLUCOTROL) tablet 10 mg  10 mg Oral BID AC Hampton Abbot, MD   10 mg at 03/17/16 9774  . hydrOXYzine (ATARAX/VISTARIL) tablet 25 mg  25 mg Oral TID PRN Benjamine Mola, FNP      . hydrOXYzine (ATARAX/VISTARIL) tablet 25 mg  25 mg Oral TID PRN Nanci Pina, FNP   25 mg at 03/16/16 1006  . insulin aspart (novoLOG) injection 0-15 Units  0-15 Units Subcutaneous TID WC Benjamine Mola, FNP   3 Units at 03/17/16 1214  . insulin detemir (LEVEMIR) injection 20 Units  20 Units Subcutaneous QHS Benjamine Mola, FNP   20 Units at 03/16/16 2156  . insulin detemir (LEVEMIR) injection 25 Units  25 Units Subcutaneous Daily Benjamine Mola, FNP   25 Units at 03/17/16 [phone]  . magnesium hydroxide (MILK OF MAGNESIA) suspension 30 mL  30 mL Oral Daily PRN Benjamine Mola, FNP      . magnesium hydroxide (MILK OF MAGNESIA) suspension 30 mL  30 mL Oral Daily PRN Nanci Pina, FNP      . metFORMIN (GLUCOPHAGE) tablet 1,000 mg  1,000 mg Oral BID WC Hampton Abbot, MD   1,000 mg at 03/17/16 9532  . simvastatin (ZOCOR) tablet 40 mg  40 mg Oral q1800 Benjamine Mola, FNP   40 mg at 03/16/16 1700  . venlafaxine XR (EFFEXOR-XR) 24 hr capsule 150 mg  150 mg Oral Q breakfast Benjamine Mola, FNP   150 mg at 03/17/16 0233  . ziprasidone (GEODON) capsule 40 mg  40 mg Oral BID WC Benjamine Mola, FNP   40 mg at 03/17/16 4356  . zolpidem (AMBIEN)  tablet 10 mg  10 mg Oral QHS PRN Jenne Campus, MD        Lab Results:  Results for orders placed or performed during the hospital encounter of 03/13/16 (from the past 48 hour(s))  Glucose, capillary     Status: Abnormal   Collection Time: 03/15/16  4:58 PM  Result Value Ref Range   Glucose-Capillary 194 (H) 65 - 99 mg/dL   Comment 1 Notify RN    Comment 2 Document in Chart  Glucose, capillary     Status: Abnormal   Collection Time: 03/15/16  8:37 PM  Result Value Ref Range   Glucose-Capillary 203 (H) 65 - 99 mg/dL   Comment 1 Notify RN   Glucose, capillary     Status: Abnormal   Collection Time: 03/16/16  5:59 AM  Result Value Ref Range   Glucose-Capillary 230 (H) 65 - 99 mg/dL  Glucose, capillary     Status: Abnormal   Collection Time: 03/16/16 11:30 AM  Result Value Ref Range   Glucose-Capillary 157 (H) 65 - 99 mg/dL   Comment 1 Notify RN    Comment 2 Document in Chart   Glucose, capillary     Status: Abnormal   Collection Time: 03/16/16  4:31 PM  Result Value Ref Range   Glucose-Capillary 320 (H) 65 - 99 mg/dL  Glucose, capillary     Status: Abnormal   Collection Time: 03/16/16  8:27 PM  Result Value Ref Range   Glucose-Capillary 190 (H) 65 - 99 mg/dL   Comment 1 Notify RN   Glucose, capillary     Status: Abnormal   Collection Time: 03/17/16  5:52 AM  Result Value Ref Range   Glucose-Capillary 200 (H) 65 - 99 mg/dL   Comment 1 Notify RN   Glucose, capillary     Status: Abnormal   Collection Time: 03/17/16 12:04 PM  Result Value Ref Range   Glucose-Capillary 194 (H) 65 - 99 mg/dL   Comment 1 Notify RN    Comment 2 Document in Chart     Blood Alcohol level:  Lab Results  Component Value Date   ETH <5 03/12/2016   ETH <5 46/56/8127    Metabolic Disorder Labs: Lab Results  Component Value Date   HGBA1C 11.8 (H) 02/08/2016   MPG 275 08/03/2015   MPG 295 01/17/2015   Lab Results  Component Value Date   PROLACTIN 3.4 (L) 02/08/2016   PROLACTIN 26.5  (H) 01/17/2015   Lab Results  Component Value Date   CHOL 213 (H) 02/08/2016   TRIG 127 02/08/2016   HDL 42 02/08/2016   CHOLHDL 5.1 02/08/2016   VLDL 25 02/08/2016   LDLCALC 146 (H) 02/08/2016   LDLCALC 183 (H) 08/03/2015    Physical Findings: AIMS: Facial and Oral Movements Muscles of Facial Expression: None, normal Lips and Perioral Area: None, normal Jaw: None, normal Tongue: None, normal,Extremity Movements Upper (arms, wrists, hands, fingers): None, normal Lower (legs, knees, ankles, toes): None, normal, Trunk Movements Neck, shoulders, hips: None, normal, Overall Severity Severity of abnormal movements (highest score from questions above): None, normal Incapacitation due to abnormal movements: None, normal Patient's awareness of abnormal movements (rate only patient's report): No Awareness, Dental Status Current problems with teeth and/or dentures?: No Does patient usually wear dentures?: No  CIWA:    COWS:     Musculoskeletal: Strength & Muscle Tone: within normal limits Gait & Station: normal Patient leans: N/A  Psychiatric Specialty Exam: Physical Exam  Nursing note and vitals reviewed.   Review of Systems  Psychiatric/Behavioral: Positive for depression, substance abuse and suicidal ideas.  All other systems reviewed and are negative. denies headache, denies chest pain, no shortness of breath, no vomiting   Blood pressure 118/89, pulse 74, temperature 98.6 F (37 C), temperature source Oral, resp. rate 16, height 5' 8"  (1.727 m), weight 205 lb (93 kg), SpO2 100 %.Body mass index is 31.17 kg/m.  General Appearance: Casual  Eye Contact:  Good  Speech:  Clear and  Coherent  Volume:  Normal  Mood:  Reports ongoing depression, sadness   Affect:  Constricted, does smile briefly at times   Thought Process:  Goal Directed  Orientation:  Full (Time, Place, and Person)  Thought Content:  Describes auditory hallucinations saying he is worthless, no delusions  expressed, does not present internally preoccupied   Suicidal Thoughts:  Describes passive thoughts of death, dying, but does not endorse any suicidal plan or intent and contracts for safety on unit   Homicidal Thoughts:  No  Memory: recent and remote grossly intact   Judgement:  Poor  Insight:  Lacking  Psychomotor Activity:  Normal  Concentration:  Concentration: Good and Attention Span: Good  Recall:  Good  Fund of Knowledge:  Good  Language:  Good  Akathisia:  No  Handed:  Right  AIMS (if indicated):     Assets:  Communication Skills Desire for Improvement Financial Resources/Insurance Resilience Social Support  ADL's:  Intact  Cognition:  WNL  Sleep:  Number of Hours: 6.25    Assessment - patient reports ongoing depression, sadness, passive thoughts of death, dying ( but no active SI and able to contract for safety) , and intermittent auditory hallucinations . He describes housing issues as his major stressor, states he was staying with a friend, but that it was not a good environment for him due to substance abuse going on there . Thus far tolerating Geodon and Effexor XR well. Sleeping fairly .  Treatment Plan Summary: Daily contact with patient to assess and evaluate symptoms and progress in treatment and Medication management    Encourage ongoing group and milieu participation to work on coping skills and symptom reduction  Continue to encourage efforts to maintain sobriety and minimize relapse risk  Continue Geodon 40 mgrs BID for mood disorder and psychotic symptoms Continue Effexor XR 225 mgrs QDAY for depression  Increase Ambien to 10 mgrs QHS PRN for insomnia as needed  Continue DM management  Check HgbA1C, Lipid Panel, Prolactin, as on Geodon management  Treatment team working on disposition planning   Neita Garnet, MD 03/17/2016, 1:34 PM

## 2016-03-17 NOTE — Progress Notes (Signed)
D: Patient spent most shift in his room. Depressed mood. Continues to endorse depression which he rated 7/10. Denies pain, SI, AH/VH at this time. Stated his day "same-same".  A: Staff offered support and encouraged patient to spent time on day room and verbalize any concerns to staff. Staff offered due meds as prescribed. Safety maintained by every 15 minutes check. Will continue to monitor patient for safety and stability.  R: Patient remains safe. Sleeping at this time.

## 2016-03-17 NOTE — Progress Notes (Signed)
Data. Patient reports feeling suicidal this morning. He can contract for safety, "It's not like I can do anything here". And agrees to come to staff prior to acting on his thoughts if they become that overwhelming. Patient also reports, "voices", that are, "Telling me to kill myself". Patient states that he can contract to not act on the commands of his, "voices." Denies HI/VH.   Patient not  interacting well with staff and other patients. He keeps to himself, even when he is in the common room or the dinning room. On his self assessment he reports, 8/10 for anxiety, depression and hopelessness. His goal for today is: "Get better. Placement." Action. Emotional support and encouragement offered. Education provided on medication, indications and side effect. Q 15 minute checks done for safety. Response. Safety on the unit maintained through 15 minute checks.  Medications taken as prescribed. Attended groups. Remained calm and appropriate through out shift.

## 2016-03-17 NOTE — Progress Notes (Signed)
Patient ID: Joseph LefortReginald Vasquez, male   DOB: 09/30/1960, 55 y.o.   MRN: [id number] D: Client reports plans to go to a nursing home, interaction minimal, refused sleep medications. Noted elevated BS. A:Writer provided emotional support, discussed elevated BS, reported to S. Joseph BeamSimon, [place]. Insulin coverage increased.(see MAR). Insulin dosage reviewed, administered as ordered.Staff will monitor q1515min for safety. R: Client is safe on the unit.

## 2016-03-17 NOTE — Tx Team (Signed)
Interdisciplinary Treatment Plan Update (Adult) Date: 03/17/2016   Date: 03/17/2016 4:30 PM  Progress in Treatment:  Attending groups:No Participating in groups: No Taking medication as prescribed: Yes  Tolerating medication: Yes  Family/Significant othe contact made: No, CSW attempting to contact mother Patient understands diagnosis: Continuing to assess Discussing patient identified problems/goals with staff: Yes  Medical problems stabilized or resolved: Yes  Denies suicidal/homicidal ideation: No, endorses SI Patient has not harmed self or Others: Yes   New problem(s) identified: None identified at this time.   Discharge Plan or Barriers: Pt requesting group home placement  Additional comments:  Patient and CSW reviewed pt's identified goals and treatment plan. Patient verbalized understanding and agreed to treatment plan.   Reason for Continuation of Hospitalization:  Depression Medication stabilization Suicidal ideation Hallucinations  Estimated length of stay: 3-5 days  Review of initial/current patient goals per problem list:   1.  Goal(s): Patient will participate in aftercare plan  Met:  No  Target date: 3-5 days from date of admission   As evidenced by: Patient will participate within aftercare plan AEB aftercare provider and housing plan at discharge being identified.  03/17/16: CSW to work with Pt to assess for appropriate discharge plan and faciliate appointments and referrals as needed prior to d/c. CSW to work on group home placement.   2.  Goal (s): Patient will exhibit decreased depressive symptoms and suicidal ideations.  Met:  No  Target date: 3-5 days from date of admission   As evidenced by: Patient will utilize self rating of depression at 3 or below and demonstrate decreased signs of depression or be deemed stable for discharge by MD.  03/17/16: Pt rates depression at 8/10; endorses SI  5.  Goal(s): Patient will demonstrate decreased signs of  psychosis  . Met:  No . Target date: 3-5 days from date of admission  . As evidenced by: Patient will demonstrate decreased frequency of AVH or return to baseline function    -03/17/16: Pt continues to report command hallucinations to kill himself  Attendees:  Patient:    Family:    Physician: Dr. Parke Poisson, MD  03/17/2016 4:30 PM  Nursing: Leanne Lovely, RN; Darrol Angel, RN 03/17/2016 4:30 PM  Clinical Social Worker Peri Maris, [place] 03/17/2016 4:30 PM  Other: Tilden Fossa, LCSWA 03/17/2016 4:30 PM  Clinical: Lars Pinks, RN Case manager  03/17/2016 4:30 PM  Other:  03/17/2016 4:30 PM  Other:     Peri Maris, [place] [facility] [phone]

## 2016-03-17 NOTE — Progress Notes (Signed)
Recreation Therapy Notes  Date: 03/17/16 Time: 0930 Location: [address] Group Room  Group Topic: Stress Management  Goal Area(s) Addresses:  Patient will verbalize importance of using healthy stress management.  Patient will identify positive emotions associated with healthy stress management.   Intervention: Stress Management  Activity :  Progressive Muscle Relaxation.  LRT introduced the stress management technique of progressive muscle relaxation to the patients.  Patients were to follow along as LRT read script to engaged in the technique.  Education:  Stress Management, Discharge Planning.   Education Outcome: Acknowledges edcuation/In group clarification offered/Needs additional education  Clinical Observations/Feedback: Pt did not attend group.   Caroll RancherMarjette Breklyn Fabrizio, LRT/CTRS    Lillia AbedLindsay, Datron Brakebill A 03/17/2016 1:10 PM

## 2016-03-17 NOTE — Progress Notes (Signed)
Patient ID: [name], male   DOB: 02/12/1961, 55 y.o.   MRN: [id number] PER STATE REGULATIONS 482.30  THIS CHART WAS REVIEWED FOR MEDICAL NECESSITY WITH RESPECT TO THE PATIENT'S ADMISSION/ DURATION OF STAY.  NEXT REVIEW DATE: 03/17/2016  Willa RoughJENNIFER JONES Theola Cuellar, RN, BSN CASE MANAGER

## 2016-03-18 LAB — GLUCOSE, CAPILLARY
GLUCOSE-CAPILLARY: 196 mg/dL — AB (ref 65–99)
GLUCOSE-CAPILLARY: 255 mg/dL — AB (ref 65–99)
Glucose-Capillary: 190 mg/dL — ABNORMAL HIGH (ref 65–99)
Glucose-Capillary: 209 mg/dL — ABNORMAL HIGH (ref 65–99)

## 2016-03-18 LAB — LIPID PANEL
CHOLESTEROL: 193 mg/dL (ref 0–200)
HDL: 48 mg/dL (ref >40)
LDL Cholesterol: 125 mg/dL — ABNORMAL HIGH (ref 0–99)
Total CHOL/HDL Ratio: 4 RATIO
Triglycerides: 102 mg/dL (ref <150)
VLDL: 20 mg/dL (ref 0–40)

## 2016-03-18 NOTE — Progress Notes (Signed)
Adult Psychoeducational Group Note  Date:  03/18/2016 Time:  0900 am  Group Topic/Focus:  Recovery Goals:   The focus of this group is to identify appropriate goals for recovery and establish a plan to achieve them.   Participation Level:  Minimal  Participation Quality:  Appropriate  Affect:  Appropriate  Cognitive:  Appropriate  Insight: Limited  Engagement in Group:  Limited  Modes of Intervention:  Discussion and Education  Additional Comments:  Pt left group early. Stefania Goulart L 03/18/2016, 9:57 AM

## 2016-03-18 NOTE — Progress Notes (Signed)
D: Patient has been asleep since the start of shift. Only awoke to take medication. Denies SI/HI/VH at this time. Endorses passive AH with no command to harm. Contracts for safety.  A: Encouragement and support given. Q15 minute room checks for patient safety. Medications administered as prescribed.  R: Continue to monitor for patient safety and medication effectiveness.

## 2016-03-18 NOTE — Progress Notes (Signed)
Patient ID: Joseph LefortReginald Dolph, male   DOB: 03/21/1961, 55 y.o.   MRN: [id number] D: Patient continues to endorse suicidal thoughts with no specific plan.  He contracts for safety on the unit.  He presents with flat, blunted affect with minimal interaction with staff and peers.  He continues to have intermittent auditory hallucinations.  He reports ongoing depression, anxiety and hopelessness. He is guarded and cautious with staff.  He rates all his symptoms as an 8.  His goal today is to work on "placement."  He denies HI.  He continues to report poor sleep; low energy and poor concentration.  Patient complains of some pain with his teeth.   A: Continue to monitor medication management and MD orders.  Safety checks completed every 15 minutes per protocol.  Offer support and encouragement as needed. R: Patient is receptive to staff; his behavior is appropriate.

## 2016-03-18 NOTE — Progress Notes (Signed)
Patient ID: Joseph LefortReginald Brune, male   DOB: 04/19/1961, 55 y.o.   MRN: [id number]  PER STATE REGULATIONS 482.30  THIS CHART WAS REVIEWED FOR MEDICAL NECESSITY WITH RESPECT TO THE PATIENT'S ADMISSION/ DURATION OF STAY.  NEXT REVIEW DATE: 03/21/2016  Willa RoughJENNIFER JONES [name], RN, BSN CASE MANAGER

## 2016-03-18 NOTE — [facility] Group Notes (Signed)
[facility] LCSW Group Therapy 03/18/2016 1:15 PM  Type of Therapy: Group Therapy- Feelings about Diagnosis  Pt did not attend, declined invitation.   Chad CordialLauren Carter, LCSWA 03/18/2016 4:51 PM

## 2016-03-18 NOTE — Progress Notes (Signed)
[facility] Group Notes:  (Nursing/MHT/Case Management/Adjunct)  Date:  03/18/2016  Time:  1:30 AM  Type of Therapy:  Psychoeducational Skills  Participation Level:  Active  Participation Quality:  Appropriate  Affect:  Flat  Cognitive:  Appropriate  Insight:  Improving  Engagement in Group:  Engaged  Modes of Intervention:  Education  Summary of Progress/Problems: The patient indicated that he did not have a very good day since he said that he did not "feel complete". He also stated that he is unclear about his living arrangement. As a theme for the day, his wellness strategy will be to get more exercise.   Leanore Biggers S 03/18/2016, 1:30 AM

## 2016-03-18 NOTE — Progress Notes (Addendum)
Patient ID: [name], male   DOB: 30-Jun-1961, 55 y.o.   MRN: [id number] [facility] MD Progress Note  03/18/2016 3:57 PM [name]  MRN:  [id number]  Subjective:   Patient reports he is feeling the " same", although does endorse some improvement compared to his admission presentation. Reports ongoing depression, sense of sadness, and describes ongoing passive SI, although denies any plan or intention of hurting self on unit, contracts for safety. Reports ongoing auditory hallucinations , telling him to hurt himself, states they have decreased somewhat but are still ongoing  He denies medication side effects, and feels medications are helping and well tolerated .   Objective: I have discussed case with treatment team and have met with patient . Presents alert, attentive, cooperative, pleasant, but limited in his relatedness and speech. States he still feels depressed, but denies plan or intention of hurting self on unit, although reports ongoing passive SI. He is future oriented , and focused on going to a group home/ residential  setting on discharge Limited participation in groups, and limited interaction with peers, but no disruptive or agitated behaviors . Denies medication side effects Labs- lipid panel unremarkable except for mildly increased LDL.   Principal Problem: Schizoaffective disorder, bipolar type ([place]) Diagnosis:   Patient Active Problem List   Diagnosis Date Noted  . DKA (diabetic ketoacidoses) ([place]) [E13.10] 02/28/2016  . Chest pain [R07.9] 02/28/2016  . AKI (acute kidney injury) ([place]) [N17.9] 02/28/2016  . Suicidal ideation [R45.851] 02/06/2016  . Cannabis use disorder, moderate, dependence ([place]) [F12.20] 02/06/2016  . Schizoaffective disorder, bipolar type ([place]) [F25.0] 02/05/2016  . Hyperlipidemia [E78.5] 01/18/2015  . Hyperprolactinemia ([place]) [E22.1] 01/18/2015  . Diabetes mellitus ([place]) [E11.9] 03/11/2012  . HTN (hypertension) [I10] 03/11/2012   Total Time spent  with patient: 20 minutes  Past Psychiatric History: depression, substance use.  Past Medical History:  Past Medical History:  Diagnosis Date  . Anxiety   . Bipolar affective disorder ([place])   . Bipolar disorder ([place]) 03/11/2012  . Depression   . Diabetes mellitus   . Diabetes mellitus ([place]) 03/11/2012  . Hepatitis 06/30/2013   Type C  . Hypertension   . Schizophrenia, schizo-affective ([place])     Past Surgical History:  Procedure Laterality Date  . FINGER SURGERY    . WISDOM TOOTH EXTRACTION     Family History:  Family History  Problem Relation Age of Onset  . Diabetes Mother   . Bipolar disorder Sister    Family Psychiatric  History: see H&P. Social History:  History  Alcohol Use No     History  Drug Use  . Types: Marijuana, Cocaine    Comment: 4 days ago last THC and cocoaine use... relapsed after 2 yrs     Social History   Social History  . Marital status: Single    Spouse name: N/A  . Number of children: N/A  . Years of education: N/A   Social History Main Topics  . Smoking status: Never Smoker  . Smokeless tobacco: Never Used  . Alcohol use No  . Drug use:     Types: Marijuana, Cocaine     Comment: 4 days ago last THC and cocoaine use... relapsed after 2 yrs   . Sexual activity: Yes    Birth control/ protection: Condom     Comment: marijuana 25 days ago   Other Topics Concern  . None   Social History Narrative  . None   Additional Social History:    Pain Medications:  Pt denies.  Prescriptions: Pt denies. Over the Counter: Pt denies.  History of alcohol / drug use?: Yes Longest period of sobriety (when/how long): 21 days. Negative Consequences of Use: Financial, Personal relationships, Work / School Name of Substance 1: Marijuana  1 - Age of First Use: UTA 1 - Amount (size/oz): UTA 1 - Frequency: UTA 1 - Duration: UTA 1 - Last Use / Amount: 21 days ago  Name of Substance 2: Crack  2 - Age of First Use: UTA 2 - Amount (size/oz): UTA 2 -  Frequency: UTA 2 - Duration: UTA 2 - Last Use / Amount: 21 days ago.  Sleep: partially improved   Appetite:  Fair  Current Medications: Current Facility-Administered Medications  Medication Dose Route Frequency Provider Last Rate Last Dose  . acetaminophen (TYLENOL) tablet 650 mg  650 mg Oral Q6H PRN Nanci Pina, FNP   650 mg at 03/17/16 0834  . alum & mag hydroxide-simeth (MAALOX/MYLANTA) 200-200-20 MG/5ML suspension 30 mL  30 mL Oral Q4H PRN Benjamine Mola, FNP      . glipiZIDE (GLUCOTROL) tablet 10 mg  10 mg Oral BID AC Hampton Abbot, MD   10 mg at 03/18/16 9191  . hydrOXYzine (ATARAX/VISTARIL) tablet 25 mg  25 mg Oral TID PRN Nanci Pina, FNP   25 mg at 03/16/16 1006  . insulin aspart (novoLOG) injection 0-15 Units  0-15 Units Subcutaneous TID WC Benjamine Mola, FNP   3 Units at 03/18/16 1205  . insulin detemir (LEVEMIR) injection 25 Units  25 Units Subcutaneous Daily Benjamine Mola, FNP   25 Units at 03/18/16 0818  . insulin detemir (LEVEMIR) injection 26 Units  26 Units Subcutaneous QHS Laverle Hobby, PA-C   26 Units at 03/17/16 2105  . magnesium hydroxide (MILK OF MAGNESIA) suspension 30 mL  30 mL Oral Daily PRN Nanci Pina, FNP      . metFORMIN (GLUCOPHAGE) tablet 1,000 mg  1,000 mg Oral BID WC Hampton Abbot, MD   1,000 mg at 03/18/16 0816  . simvastatin (ZOCOR) tablet 40 mg  40 mg Oral q1800 Benjamine Mola, FNP   40 mg at 03/17/16 1704  . venlafaxine XR (EFFEXOR-XR) 24 hr capsule 150 mg  150 mg Oral Q breakfast Benjamine Mola, FNP   150 mg at 03/18/16 0816  . ziprasidone (GEODON) capsule 40 mg  40 mg Oral BID WC Benjamine Mola, FNP   40 mg at 03/18/16 0816  . zolpidem (AMBIEN) tablet 10 mg  10 mg Oral QHS PRN Jenne Campus, MD        Lab Results:  Results for orders placed or performed during the hospital encounter of 03/13/16 (from the past 48 hour(s))  Glucose, capillary     Status: Abnormal   Collection Time: 03/16/16  4:31 PM  Result Value Ref Range    Glucose-Capillary 320 (H) 65 - 99 mg/dL  Glucose, capillary     Status: Abnormal   Collection Time: 03/16/16  8:27 PM  Result Value Ref Range   Glucose-Capillary 190 (H) 65 - 99 mg/dL   Comment 1 Notify RN   Glucose, capillary     Status: Abnormal   Collection Time: 03/17/16  5:52 AM  Result Value Ref Range   Glucose-Capillary 200 (H) 65 - 99 mg/dL   Comment 1 Notify RN   Glucose, capillary     Status: Abnormal   Collection Time: 03/17/16 12:04 PM  Result Value Ref Range  Glucose-Capillary 194 (H) 65 - 99 mg/dL   Comment 1 Notify RN    Comment 2 Document in Chart   Glucose, capillary     Status: Abnormal   Collection Time: 03/17/16  4:49 PM  Result Value Ref Range   Glucose-Capillary 269 (H) 65 - 99 mg/dL   Comment 1 Notify RN    Comment 2 Document in Chart   Glucose, capillary     Status: Abnormal   Collection Time: 03/17/16  8:27 PM  Result Value Ref Range   Glucose-Capillary 412 (H) 65 - 99 mg/dL   Comment 1 Notify RN   Glucose, capillary     Status: Abnormal   Collection Time: 03/18/16  6:09 AM  Result Value Ref Range   Glucose-Capillary 190 (H) 65 - 99 mg/dL   Comment 1 Notify RN   Lipid panel     Status: Abnormal   Collection Time: 03/18/16  6:36 AM  Result Value Ref Range   Cholesterol 193 0 - 200 mg/dL   Triglycerides 102 <150 mg/dL   HDL 48 >40 mg/dL   Total CHOL/HDL Ratio 4.0 RATIO   VLDL 20 0 - 40 mg/dL   LDL Cholesterol 125 (H) 0 - 99 mg/dL    Comment:        Total Cholesterol/HDL:CHD Risk Coronary Heart Disease Risk Table                     Men   Women  1/2 Average Risk   3.4   3.3  Average Risk       5.0   4.4  2 X Average Risk   9.6   7.1  3 X Average Risk  23.4   11.0        Use the calculated Patient Ratio above and the CHD Risk Table to determine the patient's CHD Risk.        ATP III CLASSIFICATION (LDL):  <100     mg/dL   Optimal  100-129  mg/dL   Near or Above                    Optimal  130-159  mg/dL   Borderline  160-189  mg/dL    High  >190     mg/dL   Very High Performed at [facility]   Glucose, capillary     Status: Abnormal   Collection Time: 03/18/16 12:03 PM  Result Value Ref Range   Glucose-Capillary 196 (H) 65 - 99 mg/dL   Comment 1 Notify RN     Blood Alcohol level:  Lab Results  Component Value Date   ETH <5 03/12/2016   ETH <5 17/91/5056    Metabolic Disorder Labs: Lab Results  Component Value Date   HGBA1C 11.8 (H) 02/08/2016   MPG 275 08/03/2015   MPG 295 01/17/2015   Lab Results  Component Value Date   PROLACTIN 3.4 (L) 02/08/2016   PROLACTIN 26.5 (H) 01/17/2015   Lab Results  Component Value Date   CHOL 193 03/18/2016   TRIG 102 03/18/2016   HDL 48 03/18/2016   CHOLHDL 4.0 03/18/2016   VLDL 20 03/18/2016   LDLCALC 125 (H) 03/18/2016   LDLCALC 146 (H) 02/08/2016    Physical Findings: AIMS: Facial and Oral Movements Muscles of Facial Expression: None, normal Lips and Perioral Area: None, normal Jaw: None, normal Tongue: None, normal,Extremity Movements Upper (arms, wrists, hands, fingers): None, normal Lower (legs, knees, ankles, toes): None, normal,  Trunk Movements Neck, shoulders, hips: None, normal, Overall Severity Severity of abnormal movements (highest score from questions above): None, normal Incapacitation due to abnormal movements: None, normal Patient's awareness of abnormal movements (rate only patient's report): No Awareness, Dental Status Current problems with teeth and/or dentures?: No Does patient usually wear dentures?: No  CIWA:    COWS:     Musculoskeletal: Strength & Muscle Tone: within normal limits Gait & Station: normal Patient leans: N/A  Psychiatric Specialty Exam: Physical Exam  Nursing note and vitals reviewed.   Review of Systems  Psychiatric/Behavioral: Positive for depression, substance abuse and suicidal ideas.  All other systems reviewed and are negative. denies headache, denies chest pain, no shortness of breath, no  vomiting   Blood pressure 131/80, pulse 69, temperature 98.6 F (37 C), resp. rate 18, height _0  (1.727 m), weight 205 lb (93 kg), SpO2 100 %.Body mass index is 31.17 kg/m.  General Appearance: Casual  Eye Contact:  Good  Speech:  Clear and Coherent  Volume:  Normal  Mood:  Reports ongoing depression  Affect:  Somewhat flat, blunted  Thought Process:  Goal Directed  Orientation:  Full (Time, Place, and Person)  Thought Content:  Describes auditory hallucinations ,no delusions expressed, does not present internally preoccupied   Suicidal Thoughts: currently denies plan or intention of suicide , contracts for safety on unit, does report passive SI  Homicidal Thoughts:  No  Memory: recent and remote grossly intact   Judgement:  Fair   Insight:   Fair   Psychomotor Activity:  Normal  Concentration:  Concentration: Good and Attention Span: Good  Recall:  Good  Fund of Knowledge:  Good  Language:  Good  Akathisia:  No  Handed:  Right  AIMS (if indicated):     Assets:  Communication Skills Desire for Improvement Financial Resources/Insurance Resilience Social Support  ADL's:  Intact  Cognition:  WNL  Sleep:  Number of Hours: 6.25    Assessment - patient reports persistent depression, sadness, and reports passive SI. Denies active SI and contracts for safety on the unit. Reports intermittent hallucinations but does not appear internally preoccupied or bizarre ./paranoid . Limited interactions with peers, group participation . He is future oriented and hoping to be discharged to a group home setting. Denies medication  side effects at this time .   Treatment Plan Summary: Daily contact with patient to assess and evaluate symptoms and progress in treatment and Medication management    Encourage ongoing group and milieu participation to work on coping skills and symptom reduction  Continue to encourage efforts to maintain sobriety and minimize relapse risk  Continue Geodon 40  mgrs BID for mood disorder and psychotic symptoms Continue Effexor XR 150 mgrs QDAY for depression  Continue  Ambien10 mgrs QHS PRN for insomnia as needed  Continue DM management  Treatment team working on disposition planning - patient future oriented, wants to go to a group home setting if possible   Neita Garnet, MD 03/18/2016, 3:57 PM

## 2016-03-19 LAB — GLUCOSE, CAPILLARY
GLUCOSE-CAPILLARY: 106 mg/dL — AB (ref 65–99)
Glucose-Capillary: 179 mg/dL — ABNORMAL HIGH (ref 65–99)
Glucose-Capillary: 215 mg/dL — ABNORMAL HIGH (ref 65–99)
Glucose-Capillary: 197 mg/dL — ABNORMAL HIGH (ref 65–99)

## 2016-03-19 LAB — PROLACTIN: Prolactin: 21.5 ng/mL — ABNORMAL HIGH (ref 4.0–15.2)

## 2016-03-19 LAB — HEMOGLOBIN A1C
Hgb A1c MFr Bld: 14.2 % — ABNORMAL HIGH (ref 4.8–5.6)
MEAN PLASMA GLUCOSE: 361 mg/dL

## 2016-03-19 NOTE — Progress Notes (Signed)
Adult Psychoeducational Group Note  Date:  03/19/2016 Time:  9:26 PM  Group Topic/Focus:  Wrap-Up Group:   The focus of this group is to help patients review their daily goal of treatment and discuss progress on daily workbooks.   Participation Level:  Active  Participation Quality:  Appropriate  Affect:  Appropriate  Cognitive:  Alert  Insight: Appropriate  Engagement in Group:  Engaged  Modes of Intervention:  Discussion  Additional Comments:  Patient states, "I had a great day". Patient's goal for today was "to find placement".  Kamryn Messineo L Chirag Krueger 03/19/2016, 9:26 PM

## 2016-03-19 NOTE — Progress Notes (Signed)
Patient ID: [name], male   DOB: Jul 04, 1961, 55 y.o.   MRN: [id number] [facility] MD Progress Note  03/19/2016 3:34 PM [name]  MRN:  [id number]  Subjective:  Patient reports he is still depressed, but states he feels better today, and feels less depressed than yesterday . He also reports that auditory hallucinations have decreased and " they are not bothering me today ". Denies medication side effects.  As he improves he is focusing more on disposition planning . As noted, wants to go to a group home, but today states that going to live with his sister for a period of time may also be an option for him .   Objective: I have discussed case with treatment team and have met with patient . As discussed with staff, reviewed in chart, patient continues to report hight level of depression , subjective sense of hopelessness. He has been focused on placement issues . As discussed with CSW, team, patient had been referred to a [facility] recently following admission at [facility], but had left soon afterward- I spoke with patient about this- states " it was not a good place, dirty, did not have the basic things". Patient reports improving mood today  , feels  less depressed. Reports improving auditory hallucinations, no delusions are expressed, no thought disorder noted, does not appear internally preoccupied . He is tolerating medications well . States he feels " they are starting to help". No  disruptive or agitated behaviors on unit, visible on unit .   Principal Problem: Schizoaffective disorder, bipolar type ([place]) Diagnosis:   Patient Active Problem List   Diagnosis Date Noted  . DKA (diabetic ketoacidoses) ([place]) [E13.10] 02/28/2016  . Chest pain [R07.9] 02/28/2016  . AKI (acute kidney injury) ([place]) [N17.9] 02/28/2016  . Suicidal ideation [R45.851] 02/06/2016  . Cannabis use disorder, moderate, dependence ([place]) [F12.20] 02/06/2016  . Schizoaffective disorder, bipolar type ([place]) [F25.0]  02/05/2016  . Hyperlipidemia [E78.5] 01/18/2015  . Hyperprolactinemia ([place]) [E22.1] 01/18/2015  . Diabetes mellitus ([place]) [E11.9] 03/11/2012  . HTN (hypertension) [I10] 03/11/2012   Total Time spent with patient: 20 minutes  Past Psychiatric History: depression, substance use.  Past Medical History:  Past Medical History:  Diagnosis Date  . Anxiety   . Bipolar affective disorder ([place])   . Bipolar disorder ([place]) 03/11/2012  . Depression   . Diabetes mellitus   . Diabetes mellitus ([place]) 03/11/2012  . Hepatitis 06/30/2013   Type C  . Hypertension   . Schizophrenia, schizo-affective ([place])     Past Surgical History:  Procedure Laterality Date  . FINGER SURGERY    . WISDOM TOOTH EXTRACTION     Family History:  Family History  Problem Relation Age of Onset  . Diabetes Mother   . Bipolar disorder Sister    Family Psychiatric  History: see H&P. Social History:  History  Alcohol Use No     History  Drug Use  . Types: Marijuana, Cocaine    Comment: 4 days ago last THC and cocoaine use... relapsed after 2 yrs     Social History   Social History  . Marital status: Single    Spouse name: N/A  . Number of children: N/A  . Years of education: N/A   Social History Main Topics  . Smoking status: Never Smoker  . Smokeless tobacco: Never Used  . Alcohol use No  . Drug use:     Types: Marijuana, Cocaine     Comment: 4 days ago last THC  and cocoaine use... relapsed after 2 yrs   . Sexual activity: Yes    Birth control/ protection: Condom     Comment: marijuana 25 days ago   Other Topics Concern  . None   Social History Narrative  . None   Additional Social History:    Pain Medications: Pt denies.  Prescriptions: Pt denies. Over the Counter: Pt denies.  History of alcohol / drug use?: Yes Longest period of sobriety (when/how long): 21 days. Negative Consequences of Use: Financial, Personal relationships, Work / School Name of Substance 1: Marijuana  1 - Age of  First Use: UTA 1 - Amount (size/oz): UTA 1 - Frequency: UTA 1 - Duration: UTA 1 - Last Use / Amount: 21 days ago  Name of Substance 2: Crack  2 - Age of First Use: UTA 2 - Amount (size/oz): UTA 2 - Frequency: UTA 2 - Duration: UTA 2 - Last Use / Amount: 21 days ago.  Sleep: improved   Appetite:  Improving   Current Medications: Current Facility-Administered Medications  Medication Dose Route Frequency Provider Last Rate Last Dose  . acetaminophen (TYLENOL) tablet 650 mg  650 mg Oral Q6H PRN Nanci Pina, FNP   650 mg at 03/17/16 0834  . alum & mag hydroxide-simeth (MAALOX/MYLANTA) 200-200-20 MG/5ML suspension 30 mL  30 mL Oral Q4H PRN Benjamine Mola, FNP      . glipiZIDE (GLUCOTROL) tablet 10 mg  10 mg Oral BID AC Hampton Abbot, MD   10 mg at 03/19/16 2633  . hydrOXYzine (ATARAX/VISTARIL) tablet 25 mg  25 mg Oral TID PRN Nanci Pina, FNP   25 mg at 03/16/16 1006  . insulin aspart (novoLOG) injection 0-15 Units  0-15 Units Subcutaneous TID WC Benjamine Mola, FNP      . insulin detemir (LEVEMIR) injection 25 Units  25 Units Subcutaneous Daily Benjamine Mola, FNP   25 Units at 03/19/16 0815  . insulin detemir (LEVEMIR) injection 26 Units  26 Units Subcutaneous QHS Laverle Hobby, PA-C   26 Units at 03/18/16 2207  . magnesium hydroxide (MILK OF MAGNESIA) suspension 30 mL  30 mL Oral Daily PRN Nanci Pina, FNP      . metFORMIN (GLUCOPHAGE) tablet 1,000 mg  1,000 mg Oral BID WC Hampton Abbot, MD   1,000 mg at 03/19/16 0815  . simvastatin (ZOCOR) tablet 40 mg  40 mg Oral q1800 Benjamine Mola, FNP   40 mg at 03/18/16 1704  . venlafaxine XR (EFFEXOR-XR) 24 hr capsule 150 mg  150 mg Oral Q breakfast Benjamine Mola, FNP   150 mg at 03/19/16 0815  . ziprasidone (GEODON) capsule 40 mg  40 mg Oral BID WC Benjamine Mola, FNP   40 mg at 03/19/16 0815  . zolpidem (AMBIEN) tablet 10 mg  10 mg Oral QHS PRN Jenne Campus, MD        Lab Results:  Results for orders placed or performed  during the hospital encounter of 03/13/16 (from the past 48 hour(s))  Glucose, capillary     Status: Abnormal   Collection Time: 03/17/16  4:49 PM  Result Value Ref Range   Glucose-Capillary 269 (H) 65 - 99 mg/dL   Comment 1 Notify RN    Comment 2 Document in Chart   Glucose, capillary     Status: Abnormal   Collection Time: 03/17/16  8:27 PM  Result Value Ref Range   Glucose-Capillary 412 (H) 65 - 99 mg/dL  Comment 1 Notify RN   Glucose, capillary     Status: Abnormal   Collection Time: 03/18/16  6:09 AM  Result Value Ref Range   Glucose-Capillary 190 (H) 65 - 99 mg/dL   Comment 1 Notify RN   Hemoglobin A1c     Status: Abnormal   Collection Time: 03/18/16  6:36 AM  Result Value Ref Range   Hgb A1c MFr Bld 14.2 (H) 4.8 - 5.6 %    Comment: (NOTE)         Pre-diabetes: 5.7 - 6.4         Diabetes: >6.4         Glycemic control for adults with diabetes: <7.0    Mean Plasma Glucose 361 mg/dL    Comment: (NOTE) Performed At: [facility] [place], [place] [id number] Lindon Romp MD [id number] Performed at [facility]   Lipid panel     Status: Abnormal   Collection Time: 03/18/16  6:36 AM  Result Value Ref Range   Cholesterol 193 0 - 200 mg/dL   Triglycerides 102 <150 mg/dL   HDL 48 >40 mg/dL   Total CHOL/HDL Ratio 4.0 RATIO   VLDL 20 0 - 40 mg/dL   LDL Cholesterol 125 (H) 0 - 99 mg/dL    Comment:        Total Cholesterol/HDL:CHD Risk Coronary Heart Disease Risk Table                     Men   Women  1/2 Average Risk   3.4   3.3  Average Risk       5.0   4.4  2 X Average Risk   9.6   7.1  3 X Average Risk  23.4   11.0        Use the calculated Patient Ratio above and the CHD Risk Table to determine the patient's CHD Risk.        ATP III CLASSIFICATION (LDL):  <100     mg/dL   Optimal  100-129  mg/dL   Near or Above                    Optimal  130-159  mg/dL   Borderline  160-189  mg/dL   High  >190     mg/dL   Very  High Performed at [facility]   Prolactin     Status: Abnormal   Collection Time: 03/18/16  6:36 AM  Result Value Ref Range   Prolactin 21.5 (H) 4.0 - 15.2 ng/mL    Comment: (NOTE) Performed At: [facility] [place], [place] [id number] Lindon Romp MD [id number] Performed at [facility]   Glucose, capillary     Status: Abnormal   Collection Time: 03/18/16 12:03 PM  Result Value Ref Range   Glucose-Capillary 196 (H) 65 - 99 mg/dL   Comment 1 Notify RN   Glucose, capillary     Status: Abnormal   Collection Time: 03/18/16  4:56 PM  Result Value Ref Range   Glucose-Capillary 255 (H) 65 - 99 mg/dL   Comment 1 Notify RN   Glucose, capillary     Status: Abnormal   Collection Time: 03/18/16  8:02 PM  Result Value Ref Range   Glucose-Capillary 209 (H) 65 - 99 mg/dL  Glucose, capillary     Status: Abnormal   Collection Time: 03/19/16  6:06 AM  Result Value Ref  Range   Glucose-Capillary 179 (H) 65 - 99 mg/dL  Glucose, capillary     Status: Abnormal   Collection Time: 03/19/16 12:06 PM  Result Value Ref Range   Glucose-Capillary 106 (H) 65 - 99 mg/dL   Comment 1 Notify RN     Blood Alcohol level:  Lab Results  Component Value Date   ETH <5 03/12/2016   ETH <5 87/68/1157    Metabolic Disorder Labs: Lab Results  Component Value Date   HGBA1C 14.2 (H) 03/18/2016   MPG 361 03/18/2016   MPG 275 08/03/2015   Lab Results  Component Value Date   PROLACTIN 21.5 (H) 03/18/2016   PROLACTIN 3.4 (L) 02/08/2016   Lab Results  Component Value Date   CHOL 193 03/18/2016   TRIG 102 03/18/2016   HDL 48 03/18/2016   CHOLHDL 4.0 03/18/2016   VLDL 20 03/18/2016   LDLCALC 125 (H) 03/18/2016   LDLCALC 146 (H) 02/08/2016    Physical Findings: AIMS: Facial and Oral Movements Muscles of Facial Expression: None, normal Lips and Perioral Area: None, normal Jaw: None, normal Tongue: None, normal,Extremity Movements Upper (arms,  wrists, hands, fingers): None, normal Lower (legs, knees, ankles, toes): None, normal, Trunk Movements Neck, shoulders, hips: None, normal, Overall Severity Severity of abnormal movements (highest score from questions above): None, normal Incapacitation due to abnormal movements: None, normal Patient's awareness of abnormal movements (rate only patient's report): No Awareness, Dental Status Current problems with teeth and/or dentures?: No Does patient usually wear dentures?: No  CIWA:    COWS:     Musculoskeletal: Strength & Muscle Tone: within normal limits Gait & Station: normal Patient leans: N/A  Psychiatric Specialty Exam: Physical Exam  Nursing note and vitals reviewed.   Review of Systems  Psychiatric/Behavioral: Positive for depression, substance abuse and suicidal ideas.  All other systems reviewed and are negative. denies headache, denies chest pain, no shortness of breath, no vomiting   Blood pressure (!) 148/76, pulse 62, temperature 98.7 F (37.1 C), temperature source Oral, resp. rate 18, height 5' 8"  (1.727 m), weight 205 lb (93 kg), SpO2 100 %.Body mass index is 31.17 kg/m.  General Appearance: Casual  Eye Contact:  Good  Speech:  Clear and Coherent  Volume:  Normal  Mood:  Reports gradually improving depression   Affect:  More reactive   Thought Process:  Goal Directed  Orientation:  Full (Time, Place, and Person)  Thought Content: states hallucinations have decreased but not completely resolved, no delusions expressed, not internally preoccupied   Suicidal Thoughts: denies suicidal plan or intention , contracts for safety on unit   Homicidal Thoughts:  No  Memory: recent and remote grossly intact   Judgement:  Fair   Insight:   Fair   Psychomotor Activity:  Normal  Concentration:  Concentration: Good and Attention Span: Good  Recall:  Good  Fund of Knowledge:  Good  Language:  Good  Akathisia:  No  Handed:  Right  AIMS (if indicated):     Assets:   Communication Skills Desire for Improvement Financial Resources/Insurance Resilience Social Support  ADL's:  Intact  Cognition:  WNL  Sleep:  Number of Hours: 6.5    Assessment - patient reports gradually improving depression, although reports still feeling depressed, anxious . Reports also decreasing frequency of hallucinations. He is future oriented, and is focusing on disposition planning, wants to go to a [place], but today also mentions he could go live with a sister if needed . Denies medication  side effects. Treatment Plan Summary: Daily contact with patient to assess and evaluate symptoms and progress in treatment and Medication management    Encourage ongoing group and milieu participation to work on coping skills and symptom reduction  Continue to encourage efforts to maintain sobriety and minimize relapse risk  Continue Geodon 40 mgrs BID for mood disorder and psychotic symptoms Continue Effexor XR 150 mgrs QDAY for depression  Continue  Ambien10 mgrs QHS PRN for insomnia as needed  Continue DM management  Treatment team working on disposition planning, as above   Neita Garnet, MD 03/19/2016, 3:34 PM

## 2016-03-19 NOTE — [facility] Group Notes (Signed)
[facility] LCSW Group Therapy 03/19/2016 1:15 PM  Type of Therapy: Group Therapy- Emotion Regulation  Pt did not attend, declined invitation.   Chad CordialLauren Carter, LCSWA 03/19/2016 4:18 PM

## 2016-03-19 NOTE — Progress Notes (Signed)
D: Pt presents with flat affect and depressed mood. Pt rates depression 8/10. Anxiety 8/10. Hopelessness 8/10. Pt endorses passive suicidal thoughts with no plan or intent. Pt verbally contracts for safety. Pt endorses AH telling him to kill himself. Pt requesting to speak to CSW about placement. CSW discussed placement options during treatment team and will inform pt of his options. Pt compliant with medications. No side effects to meds verbalized by pt.  A: Medications reviewed with pt. Medications administered as ordered per MD. Verbal support provided. Pt encouraged to attend groups. 15 minute checks performed for safety. R: Pt receptive to tx.

## 2016-03-19 NOTE — Progress Notes (Signed)
Recreation Therapy Notes  Date: 03/19/16 Time: 0930 Location: [address] Group Room  Group Topic: Stress Management  Goal Area(s) Addresses:  Patient will verbalize importance of using healthy stress management.  Patient will identify positive emotions associated with healthy stress management.   Intervention: Stress Management  Activity :  Floating on a Cloud.  LRT introduced the technique of guided imagery to the patients.  Patients were asked to follow along as LRT read the script to participate in the activity.  Education: Stress Management, Discharge Planning.   Education Outcome: Acknowledges edcuation/In group clarification offered/Needs additional education  Clinical Observations/Feedback:  Pt did not attend group.       Sina Lucchesi, LRT/CTRS     Kayleah Appleyard A 03/19/2016 12:17 PM 

## 2016-03-19 NOTE — Progress Notes (Signed)
Inpatient Diabetes Program Recommendations  AACE/ADA: New Consensus Statement on Inpatient Glycemic Control (2015)  Target Ranges:  Prepandial:   less than 140 mg/dL      Peak postprandial:   less than 180 mg/dL (1-2 hours)      Critically ill patients:  140 - 180 mg/dL   Lab Results  Component Value Date   GLUCAP 179 (H) 03/19/2016   HGBA1C 14.2 (H) 03/18/2016    Review of Glycemic Control  Results for [name], [name] (MRN [id number]) as of 03/19/2016 11:57  Ref. Range 03/18/2016 06:09 03/18/2016 12:03 03/18/2016 16:56 03/18/2016 20:02 03/19/2016 06:06  Glucose-Capillary Latest Ref Range: 65 - 99 mg/dL 409190 (H) 811196 (H) 914255 (H) 209 (H) 179 (H)   Post-prandial blood sugars elevated.  Inpatient Diabetes Program Recommendations:    Add meal coverage insulin - Novolog 3 units tidwc if pt is eating > 50% meal.  Will continue to follow. Thank you. Ailene Ardshonda Floyde Dingley, RD, LDN, CDE Inpatient Diabetes Coordinator [phone]

## 2016-03-19 NOTE — Progress Notes (Signed)
D: [name] reports his day was "pretty good." Denies SI/HI/AVH at this time. Contracts for safety.  A: Encouragement and support given. Q15 minute room checks for patient safety. Medications administered as prescribed.  R: Continue to monitor for patient safety and medication effectiveness.

## 2016-03-20 LAB — GLUCOSE, CAPILLARY
GLUCOSE-CAPILLARY: 156 mg/dL — AB (ref 65–99)
GLUCOSE-CAPILLARY: 238 mg/dL — AB (ref 65–99)
Glucose-Capillary: 193 mg/dL — ABNORMAL HIGH (ref 65–99)
Glucose-Capillary: 282 mg/dL — ABNORMAL HIGH (ref 65–99)

## 2016-03-20 MED ORDER — TUBERCULIN PPD 5 UNIT/0.1ML ID SOLN
5.0000 [IU] | Freq: Once | INTRADERMAL | Status: AC
  Administered 2016-03-20: 5 [IU] via INTRADERMAL

## 2016-03-20 NOTE — Progress Notes (Signed)
CSW made contact with Mrs. Francena HanlyStella who [place] home in [place] and she is still interested in taking Pt as she assessed him during [facility]. Mrs. Francena HanlyStella will be out of town until Monday. FL-2 sent and MD placed order for TB test.  CSW to continue facilitating placement.   Chad CordialLauren Carter, LCSW Clinical Social Work [phone]

## 2016-03-20 NOTE — Progress Notes (Signed)
Adult Psychoeducational Group Note  Date:  03/20/2016 Time:  9:58 PM  Group Topic/Focus:  Wrap-Up Group:   The focus of this group is to help patients review their daily goal of treatment and discuss progress on daily workbooks.   Participation Level:  Active  Participation Quality:  Appropriate  Affect:  Appropriate  Cognitive:  Alert  Insight: Appropriate  Engagement in Group:  Engaged  Modes of Intervention:  Discussion  Additional Comments: Patient's goal for today was "to get placement". Patient states he will be discharging on Monday.  Dua Mehler L Piccola [place] 03/20/2016, 9:58 PM

## 2016-03-20 NOTE — Progress Notes (Signed)
Inpatient Diabetes Program Recommendations  AACE/ADA: New Consensus Statement on Inpatient Glycemic Control (2015)  Target Ranges:  Prepandial:   less than 140 mg/dL      Peak postprandial:   less than 180 mg/dL (1-2 hours)      Critically ill patients:  140 - 180 mg/dL   Lab Results  Component Value Date   GLUCAP 238 (H) 03/20/2016   HGBA1C 14.2 (H) 03/18/2016    Review of Glycemic Control  CBGs elevated at dinner and HS. May benefit from meal coverage insulin.  Inpatient Diabetes Program Recommendations:    Consider addition of Novolog 4 units bid at lunch meal and dinner meal. Add HS correction.  Will continue to follow. Thank you. Ailene Ardshonda Wyland Rastetter, RD, LDN, CDE Inpatient Diabetes Coordinator [phone]

## 2016-03-20 NOTE — Progress Notes (Signed)
D    Pt is depressed and sad    He reports some passive SI but contracts for safety   Pt is pleasant on approach and cooperative    Pt is hoping to get help with finding a place to go before he leaves the hospital A    Verbal support given   Medications administered and effectiveness monitored    Q 15 min checks    R   Pt safe at present

## 2016-03-20 NOTE — [facility] Group Notes (Signed)
[facility] Group Notes:  (Nursing/MHT/Case Management/Adjunct)  Date:  03/20/2016  Time:  4:29 PM  Type of Therapy:  Psychoeducational Skills  Participation Level:  Minimal  Participation Quality:  Attentive  Affect:  Appropriate  Cognitive:  Alert and Oriented  Insight:  Appropriate  Engagement in Group:  Resistant  Modes of Intervention:  Support  Summary of Progress/Problems: Patient is hoping for placement in a group home.  He listened attentively during group.    Cranford MonBeaudry, Brin Ruggerio Evans 03/20/2016, 4:29 PM

## 2016-03-20 NOTE — Discharge Summary (Signed)
Joseph LefortReginald Vasquez, is a 55 y.o. male  DOB 06/13/1961  MRN [id number].  Admission date:  02/28/2016  Admitting Physician  Michael LitterNikki Carter, MD  Discharge Date:  03/03/2016  Primary MD  PROVIDER NOT IN SYSTEM  Recommendations for primary care physician for things to follow:   Hyperglycemia, Dm2, DKA Please continue fsbs ac and qhs.  Cont ISS.    Admission Diagnosis  Diabetic ketoacidosis without coma associated with type 2 diabetes mellitus (HCC) [E13.10]   Discharge Diagnosis  Diabetic ketoacidosis without coma associated with type 2 diabetes mellitus (HCC) [E13.10]    Principal Problem:   DKA (diabetic ketoacidoses) (HCC) Active Problems:   HTN (hypertension)   Hyperlipidemia   Schizoaffective disorder, bipolar type (HCC)   Suicidal ideation   Cannabis use disorder, moderate, dependence (HCC)   Chest pain   AKI (acute kidney injury) (HCC)      Past Medical History:  Diagnosis Date  . Anxiety   . Bipolar affective disorder (HCC)   . Bipolar disorder (HCC) 03/11/2012  . Depression   . Diabetes mellitus   . Diabetes mellitus (HCC) 03/11/2012  . Hepatitis 06/30/2013   Type C  . Hypertension   . Schizophrenia, schizo-affective (HCC)     Past Surgical History:  Procedure Laterality Date  . FINGER SURGERY    . WISDOM TOOTH EXTRACTION         HPI  from the history and physical done on the day of admission:     55 y.o. gentleman with a history of uncontrolled Type 2 DM (last A1c 11.8 in July), HTN, dyslipidemia, polysubstance abuse, hepatitis C, schizophrenia, bipolar disorder, and recent Behavioral Health admission for psychosis and suicidal ideation.  He presents to the ED today complaining for 10/10 substernal chest pressure, acute onset while mowing the lawn, which is routine for him.  He says that he has never had symptoms like this before.  The chest discomfort has been associated with  shortness of breath, light-headedness, and diaphoresis.  He has also had nausea, vomiting, and self-limited diarrhea (one day).  No reported blood in his emesis or stool.  No LOC.  He has not found any alleviating factors.  He reports, "I am feeling suicidal because my chest pain is so bad."  He admits to active cocaine use, last use three days ago.  He adamantly denies tobacco or EtOH use.  He is somewhat restless and tends to become aggressive in mannerisms and speech patterns in the ED.  He admits that he has not taken any of his home medications in the past 2-3 days because he has been feeling poorly.  He reports that he went to [facility] to stay with a cousin after his last Behavioral Health discharge.  He reports that his medications were last filled in a local pharmacy in [place].  ED Course: Initial EKG was suggestive of ST elevation in the anterior leads.  This was reviewed with cardiology on-call per ED documentation.  There is no evidence of STEMI at this time.  Serial  troponin recommended.  The patient has received ASA 324mg  x one.  Labs show markedly elevated blood sugar with pseudohyponatremia and severe acidosis.  Ketones are present in urine, urine glucose greater than 1000, but no evidence of UTI.  Remarkably, pH is not acidotic on VBG.  He has received 2L of LR.  He received 10 units of regular insulin IV followed by regular insulin infusion per protocol.  Suicide precautions initiated in the ED.  Sitter at bedside.  The patient is requesting pain medication.  Hospitalist asked to admit.    Hospital Course:     Pt was treated with iv normal saline and  insulin and his sugar improved. Transitioned over to [place] insulin.   Pt continued to voice suicidal ideation and therefore pt will be discharged to [facility].    Follow UP     Consults obtained - psychiatry  Discharge Condition: stable  Diet and Activity recommendation: See Discharge Instructions below  Discharge  Instructions         Discharge Medications       Medication List    You have not been prescribed any medications.    insulin aspart  0-15 Units Subcutaneous TID WC   . insulin aspart  0-5 Units Subcutaneous QHS  . insulin detemir  22 Units Subcutaneous BID  . nitroGLYCERIN  0.5 inch Topical Q6H  . simvastatin  40 mg Oral q1800  . venlafaxine XR  150 mg Oral Q breakfast  . ziprasidone  40 mg Oral BID WC     Major procedures and Radiology Reports - PLEASE review detailed and final reports for all details, in brief -      Dg Chest Port 1 View  Result Date: 02/28/2016 CLINICAL DATA:  Chest pain. EXAM: PORTABLE CHEST 1 VIEW COMPARISON:  Radiographs of February 26, 2013. FINDINGS: The heart size and mediastinal contours are within normal limits. Both lungs are clear. No pneumothorax or pleural effusion is noted. The visualized skeletal structures are unremarkable. IMPRESSION: No acute cardiopulmonary abnormality seen. Electronically Signed   By: Lupita RaiderJames  Green Jr, M.D.   On: 02/28/2016 16:58    Micro Results     No results found for this or any previous visit (from the past 240 hour(s)).     Today   Subjective    Joseph LefortReginald Tierney today has no headache,no chest abdominal pain,no new weakness tingling or numbness, feels much better wants to go [facility]  today.    Objective   Blood pressure 121/71, pulse 65, temperature 97.9 F (36.6 C), temperature source Oral, resp. rate 17, height 5\' 9"  (1.753 m), weight 92.7 kg (204 lb 5.9 oz), SpO2 98 %.  No intake or output data in the 24 hours ending 03/20/16 0746  Exam Awake Alert, Oriented x 3, No new F.N deficits, Normal affect [place].AT,PERRAL Supple Neck,No JVD, No cervical lymphadenopathy appriciated.  Symmetrical Chest wall movement, Good air movement bilaterally, CTAB RRR,No Gallops,Rubs or new Murmurs, No Parasternal Heave +ve B.Sounds, Abd Soft, Non tender, No organomegaly appriciated, No rebound -guarding or  rigidity. No Cyanosis, Clubbing or edema, No new Rash or bruise   Data Review   CBC w Diff:  Lab Results  Component Value Date   WBC 9.1 03/12/2016   HGB 12.5 (L) 03/12/2016   HCT 38.0 (L) 03/12/2016   PLT 262 03/12/2016   LYMPHOPCT 9 02/28/2016   MONOPCT 5 02/28/2016   EOSPCT 0 02/28/2016   BASOPCT 0 02/28/2016    CMP:  Lab  Results  Component Value Date   NA 132 (L) 03/12/2016   K 4.1 03/12/2016   CL 102 03/12/2016   CO2 18 (L) 03/12/2016   BUN 12 03/12/2016   CREATININE 1.07 03/12/2016   PROT 7.1 03/12/2016   ALBUMIN 3.5 03/12/2016   BILITOT 0.9 03/12/2016   ALKPHOS 66 03/12/2016   AST 18 03/12/2016   ALT 15 (L) 03/12/2016  .   Total Time in preparing paper work, data evaluation and todays exam - 35 minutes  Pearson Grippe M.D 03/04/2016    [facility]   Office  [phone]

## 2016-03-20 NOTE — Progress Notes (Signed)
Patient ID: Joseph LefortReginald Loeffler, male   DOB: 08/28/1960, 55 y.o.   MRN: [id number] D: Patient states his depressive symptoms have decreased since admission.  He continues to rate his depression, hopelessness and anxiety as a 7.  His sleep is fair; appetite fair; concentration good and energy level normal.  Patient is attending some groups with minimal participation.  He denies any thoughts of self harm.  Patient presents with flat, blunted affect and depressed mood.  Patient is hoping for placement in a group home. A: Continue to monitor medication management and MD orders.  Safety checks completed every 15 minutes per protocol. Offer support and encouragement as needed. R: Patient is receptive to staff; his behavior is appropriate.

## 2016-03-20 NOTE — Progress Notes (Signed)
Adult Psychoeducational Group Note  Date:  03/20/2016 Time:  11:30 AM  Group Topic/Focus:  Goals Group:   The focus of this group is to help patients establish daily goals to achieve during treatment and discuss how the patient can incorporate goal setting into their daily lives to aide in recovery.   Participation Level:  Active  Participation Quality:  Appropriate and Attentive  Affect:  Appropriate  Cognitive:  Appropriate  Insight: Appropriate  Engagement in Group:  Engaged and Improving  Modes of Intervention:  Discussion  Additional Comments: Pt did participate in group this morning.  Pt states that he wants to find a treatment center to go to after he leaves here and he would like to get clean. Ily Denno R Analucia Hush 03/20/2016, 11:30 AM

## 2016-03-20 NOTE — Progress Notes (Addendum)
Patient ID: [name], male   DOB: [birth year], 55 y.o.   MRN: [id number] [facility] MD Progress Note  03/20/2016 5:20 PM Hitesh Fouche  MRN:  [id number]  Subjective:  Patient reports some ongoing depression, but acknowledges partial improvement since admission. At this time describes anxiety, mainly related to disposition planning .  Does not endorse medication side effects.  Objective: I have discussed case with treatment team and have met with patient . I have met with patient along with CSW, to focus on disposition planning option. Patient is interested in [facility], and there is a Group Home that has expressed interest in patient but would not be available until Monday. Patient requires PPD placement as prerequisite for placement at [facility]- he denies any coughing, fever, chills, weight loss, and states he has had negative PPD test in the past . Patient has been going to some groups, behavior on unit calm, in good control. Today presents with a more reactive affect, although reports some ongoing depression, sadness . Denies SI today.  Today does not report auditory hallucinations and does not appear internally preoccupied .   Principal Problem: Schizoaffective disorder, bipolar type ([place]) Diagnosis:   Patient Active Problem List   Diagnosis Date Noted  . DKA (diabetic ketoacidoses) ([place]) [E13.10] 02/28/2016  . Chest pain [R07.9] 02/28/2016  . AKI (acute kidney injury) ([place]) [N17.9] 02/28/2016  . Suicidal ideation [R45.851] 02/06/2016  . Cannabis use disorder, moderate, dependence ([place]) [F12.20] 02/06/2016  . Schizoaffective disorder, bipolar type ([place]) [F25.0] 02/05/2016  . Hyperlipidemia [E78.5] 01/18/2015  . Hyperprolactinemia ([place]) [E22.1] 01/18/2015  . Diabetes mellitus ([place]) [E11.9] 03/11/2012  . HTN (hypertension) [I10] 03/11/2012   Total Time spent with patient: 20 minutes  Past Psychiatric History: depression, substance use.  Past Medical History:  Past Medical  History:  Diagnosis Date  . Anxiety   . Bipolar affective disorder ([place])   . Bipolar disorder ([place]) 03/11/2012  . Depression   . Diabetes mellitus   . Diabetes mellitus ([place]) 03/11/2012  . Hepatitis 06/30/2013   Type C  . Hypertension   . Schizophrenia, schizo-affective ([place])     Past Surgical History:  Procedure Laterality Date  . FINGER SURGERY    . WISDOM TOOTH EXTRACTION     Family History:  Family History  Problem Relation Age of Onset  . Diabetes Mother   . Bipolar disorder Sister    Family Psychiatric  History: see H&P. Social History:  History  Alcohol Use No     History  Drug Use  . Types: Marijuana, Cocaine    Comment: 4 days ago last THC and cocoaine use... relapsed after 2 yrs     Social History   Social History  . Marital status: Single    Spouse name: N/A  . Number of children: N/A  . Years of education: N/A   Social History Main Topics  . Smoking status: Never Smoker  . Smokeless tobacco: Never Used  . Alcohol use No  . Drug use:     Types: Marijuana, Cocaine     Comment: 4 days ago last THC and cocoaine use... relapsed after 2 yrs   . Sexual activity: Yes    Birth control/ protection: Condom     Comment: marijuana 25 days ago   Other Topics Concern  . None   Social History Narrative  . None   Additional Social History:    Pain Medications: Pt denies.  Prescriptions: Pt denies. Over the Counter: Pt denies.  History of  alcohol / drug use?: Yes Longest period of sobriety (when/how long): 21 days. Negative Consequences of Use: Financial, Personal relationships, Work / School Name of Substance 1: Marijuana  1 - Age of First Use: UTA 1 - Amount (size/oz): UTA 1 - Frequency: UTA 1 - Duration: UTA 1 - Last Use / Amount: 21 days ago  Name of Substance 2: Crack  2 - Age of First Use: UTA 2 - Amount (size/oz): UTA 2 - Frequency: UTA 2 - Duration: UTA 2 - Last Use / Amount: 21 days ago.  Sleep: improved   Appetite:  Improving    Current Medications: Current Facility-Administered Medications  Medication Dose Route Frequency Provider Last Rate Last Dose  . acetaminophen (TYLENOL) tablet 650 mg  650 mg Oral Q6H PRN Nanci Pina, FNP   650 mg at 03/17/16 0834  . alum & mag hydroxide-simeth (MAALOX/MYLANTA) 200-200-20 MG/5ML suspension 30 mL  30 mL Oral Q4H PRN Benjamine Mola, FNP      . glipiZIDE (GLUCOTROL) tablet 10 mg  10 mg Oral BID AC Hampton Abbot, MD   10 mg at 03/20/16 1706  . hydrOXYzine (ATARAX/VISTARIL) tablet 25 mg  25 mg Oral TID PRN Nanci Pina, FNP   25 mg at 03/16/16 1006  . insulin aspart (novoLOG) injection 0-15 Units  0-15 Units Subcutaneous TID WC Benjamine Mola, FNP   5 Units at 03/20/16 1708  . insulin detemir (LEVEMIR) injection 25 Units  25 Units Subcutaneous Daily Benjamine Mola, FNP   25 Units at 03/20/16 [phone]  . insulin detemir (LEVEMIR) injection 26 Units  26 Units Subcutaneous QHS Laverle Hobby, PA-C   26 Units at 03/19/16 2056  . magnesium hydroxide (MILK OF MAGNESIA) suspension 30 mL  30 mL Oral Daily PRN Nanci Pina, FNP      . metFORMIN (GLUCOPHAGE) tablet 1,000 mg  1,000 mg Oral BID WC Hampton Abbot, MD   1,000 mg at 03/20/16 1706  . simvastatin (ZOCOR) tablet 40 mg  40 mg Oral q1800 Benjamine Mola, FNP   40 mg at 03/20/16 1706  . tuberculin injection 5 Units  5 Units Intradermal Once Jenne Campus, MD   5 Units at 03/20/16 1203  . venlafaxine XR (EFFEXOR-XR) 24 hr capsule 150 mg  150 mg Oral Q breakfast Benjamine Mola, FNP   150 mg at 03/20/16 0816  . ziprasidone (GEODON) capsule 40 mg  40 mg Oral BID WC Benjamine Mola, FNP   40 mg at 03/20/16 1706  . zolpidem (AMBIEN) tablet 10 mg  10 mg Oral QHS PRN Jenne Campus, MD   10 mg at 03/19/16 2210    Lab Results:  Results for orders placed or performed during the hospital encounter of 03/13/16 (from the past 48 hour(s))  Glucose, capillary     Status: Abnormal   Collection Time: 03/18/16  8:02 PM  Result Value Ref  Range   Glucose-Capillary 209 (H) 65 - 99 mg/dL  Glucose, capillary     Status: Abnormal   Collection Time: 03/19/16  6:06 AM  Result Value Ref Range   Glucose-Capillary 179 (H) 65 - 99 mg/dL  Glucose, capillary     Status: Abnormal   Collection Time: 03/19/16 12:06 PM  Result Value Ref Range   Glucose-Capillary 106 (H) 65 - 99 mg/dL   Comment 1 Notify RN   Glucose, capillary     Status: Abnormal   Collection Time: 03/19/16  5:05 PM  Result Value  Ref Range   Glucose-Capillary 215 (H) 65 - 99 mg/dL   Comment 1 Notify RN    Comment 2 Document in Chart   Glucose, capillary     Status: Abnormal   Collection Time: 03/19/16  8:01 PM  Result Value Ref Range   Glucose-Capillary 197 (H) 65 - 99 mg/dL  Glucose, capillary     Status: Abnormal   Collection Time: 03/20/16  6:17 AM  Result Value Ref Range   Glucose-Capillary 193 (H) 65 - 99 mg/dL  Glucose, capillary     Status: Abnormal   Collection Time: 03/20/16 11:57 AM  Result Value Ref Range   Glucose-Capillary 156 (H) 65 - 99 mg/dL  Glucose, capillary     Status: Abnormal   Collection Time: 03/20/16  5:04 PM  Result Value Ref Range   Glucose-Capillary 238 (H) 65 - 99 mg/dL    Blood Alcohol level:  Lab Results  Component Value Date   ETH <5 03/12/2016   ETH <5 02/72/5366    Metabolic Disorder Labs: Lab Results  Component Value Date   HGBA1C 14.2 (H) 03/18/2016   MPG 361 03/18/2016   MPG 275 08/03/2015   Lab Results  Component Value Date   PROLACTIN 21.5 (H) 03/18/2016   PROLACTIN 3.4 (L) 02/08/2016   Lab Results  Component Value Date   CHOL 193 03/18/2016   TRIG 102 03/18/2016   HDL 48 03/18/2016   CHOLHDL 4.0 03/18/2016   VLDL 20 03/18/2016   LDLCALC 125 (H) 03/18/2016   LDLCALC 146 (H) 02/08/2016    Physical Findings: AIMS: Facial and Oral Movements Muscles of Facial Expression: None, normal Lips and Perioral Area: None, normal Jaw: None, normal Tongue: None, normal,Extremity Movements Upper (arms,  wrists, hands, fingers): None, normal Lower (legs, knees, ankles, toes): None, normal, Trunk Movements Neck, shoulders, hips: None, normal, Overall Severity Severity of abnormal movements (highest score from questions above): None, normal Incapacitation due to abnormal movements: None, normal Patient's awareness of abnormal movements (rate only patient's report): No Awareness, Dental Status Current problems with teeth and/or dentures?: No Does patient usually wear dentures?: No  CIWA:    COWS:     Musculoskeletal: Strength & Muscle Tone: within normal limits Gait & Station: normal Patient leans: N/A  Psychiatric Specialty Exam: Physical Exam  Nursing note and vitals reviewed.   Review of Systems  Psychiatric/Behavioral: Positive for depression, substance abuse and suicidal ideas.  All other systems reviewed and are negative. denies headache, denies chest pain, no shortness of breath, no vomiting   Blood pressure (!) 133/93, pulse 94, temperature 97.9 F (36.6 C), temperature source Oral, resp. rate 18, height _0  (1.727 m), weight 205 lb (93 kg), SpO2 100 %.Body mass index is 31.17 kg/m.  General Appearance: fairly groomed   Eye Contact:  Good  Speech:  Clear and Coherent  Volume:  Normal  Mood:  Still depressed but states feeling better , acknowledges improvement   Affect:  More reactive , does smile at times appropriately   Thought Process:  Goal Directed  Orientation:  Full (Time, Place, and Person)  Thought Content: no hallucinations today, and does not appear internally preoccupied, no delusions expressed, focused more on disposition planning options   Suicidal Thoughts: denies suicidal plan or intention at this time and contracts for safety on unit   Homicidal Thoughts:  No  Memory: recent and remote grossly intact   Judgement:  Improving   Insight:    Improving   Psychomotor Activity:  Normal  Concentration:  Concentration: Good and Attention Span: Good  Recall:   Good  Fund of Knowledge:  Good  Language:  Good  Akathisia:  No  Handed:  Right  AIMS (if indicated):     Assets:  Communication Skills Desire for Improvement Financial Resources/Insurance Resilience Social Support  ADL's:  Intact  Cognition:  WNL  Sleep:  Number of Hours: 6.75    Assessment - gradual improvement of mood , range of affect, and resolution of auditory hallucinations - focused on disposition planning, which at this time is Group Home placement . As discussed with treatment team, CSW, patient may have a Group Home availability, acceptance early next week. Patient interested in this option . Denies medication side effects. Treatment Plan Summary: Daily contact with patient to assess and evaluate symptoms and progress in treatment and Medication management   Encourage ongoing group and milieu participation to work on coping skills and symptom reduction  Continue to encourage efforts to maintain sobriety and minimize relapse risk  Continue Geodon 40 mgrs BID for mood disorder and psychotic symptoms Continue Effexor XR 150 mgrs QDAY for depression  Continue  Ambien10 mgrs QHS PRN for insomnia as needed  Continue DM management  Treatment team working on disposition planning, as above  PPD ordered as prerequisite for [facility] placement   Neita Garnet, MD 03/20/2016, 5:20 PM

## 2016-03-21 LAB — GLUCOSE, CAPILLARY
GLUCOSE-CAPILLARY: 155 mg/dL — AB (ref 65–99)
GLUCOSE-CAPILLARY: 168 mg/dL — AB (ref 65–99)
GLUCOSE-CAPILLARY: 293 mg/dL — AB (ref 65–99)
GLUCOSE-CAPILLARY: 243 mg/dL — AB (ref 65–99)

## 2016-03-21 NOTE — Tx Team (Signed)
Interdisciplinary Treatment Plan Update (Adult) Date: 03/21/2016   Date: 03/21/2016 4:26 PM  Progress in Treatment:  Attending groups:No Participating in groups: No Taking medication as prescribed: Yes  Tolerating medication: Yes  Family/Significant othe contact made: No, CSW attempting to contact mother Patient understands diagnosis: Yes AEB seeking help with depression Discussing patient identified problems/goals with staff: Yes  Medical problems stabilized or resolved: Yes  Denies suicidal/homicidal ideation: Yes Patient has not harmed self or Others: Yes   New problem(s) identified: None identified at this time.   Discharge Plan or Barriers: Pt requesting group home placement  03/21/16: CSW continuing to facilitate group home placement. Group home staff to visit on Monday  Additional comments:  Patient and CSW reviewed pt's identified goals and treatment plan. Patient verbalized understanding and agreed to treatment plan.   Reason for Continuation of Hospitalization:  Depression Medication stabilization Suicidal ideation Hallucinations  Estimated length of stay: 3-4 days  Review of initial/current patient goals per problem list:   1.  Goal(s): Patient will participate in aftercare plan  Met:  No  Target date: 3-5 days from date of admission   As evidenced by: Patient will participate within aftercare plan AEB aftercare provider and housing plan at discharge being identified.  03/17/16: CSW to work with Pt to assess for appropriate discharge plan and faciliate appointments and referrals as needed prior to d/c. CSW to work on group home placement.  03/21/16: CSW continuing to facilitate group home placement. Group home staff to visit on Monday  2.  Goal (s): Patient will exhibit decreased depressive symptoms and suicidal ideations.  Met:  Progressing  Target date: 3-5 days from date of admission   As evidenced by: Patient will utilize self rating of depression at 3 or  below and demonstrate decreased signs of depression or be deemed stable for discharge by MD.  03/17/16: Pt rates depression at 8/10; endorses SI  03/21/16: Pt is reporting improvement in depression; denies SI  5.  Goal(s): Patient will demonstrate decreased signs of psychosis  . Met:  Yes . Target date: 3-5 days from date of admission  . As evidenced by: Patient will demonstrate decreased frequency of AVH or return to baseline function    -03/17/16: Pt continues to report command hallucinations to kill himself    -03/21/16: Pt denies AVH  Attendees:  Patient:    Family:    Physician: Dr. Parke Poisson, MD  03/21/2016 4:26 PM  Nursing: Mayra Neer, RN; Sandre Kitty, RN 03/21/2016 4:26 PM  Clinical Social Worker Peri Maris, [place] 03/21/2016 4:26 PM  Other: 03/21/2016 4:26 PM  Clinical: Lars Pinks, RN Case manager  03/21/2016 4:26 PM  Other:  03/21/2016 4:26 PM  Other:     Peri Maris, [place] [facility] [phone]

## 2016-03-21 NOTE — [facility] Group Notes (Signed)
[facility] LCSW Group Therapy 03/21/2016 1:15pm  Type of Therapy: Group Therapy- Feelings Around Relapse and Recovery  Pt did not attend, declined invitation.   Chad CordialLauren Carter, Theresia MajorsLCSWA [phone] 03/21/2016 6:13 PM

## 2016-03-21 NOTE — Progress Notes (Signed)
Pt has been visible in the dayroom most of the evening.  He reports that his day was fine and voiced no needs or concerns at this time.  He states he plans to go to a group home and that the social worker is helping him with this.  Notes show that he has a meeting set up for Monday with a group home.  Pt denies SI/HI/AVH.  He has been pleasant and cooperative with staff. Support and encouragement offered.  Discharge plans are in process.  Meds given as ordered. Safety maintained with q15 minute checks.

## 2016-03-21 NOTE — Progress Notes (Signed)
[name] remains stuck...emotionally. He is flat. Depressed. Disconnected. Slow in his movements. A HE completed his daily assessment and on it he wrote he deneid SI today and he rated his depression , anxiety and hopelessness " 6/6/6", respectively. R Safety in place. Placement con to be a challenge for pt .

## 2016-03-21 NOTE — Progress Notes (Signed)
Patient ID: Joseph LefortReginald Vasquez, male   DOB: 11/10/1960, 55 y.o.   MRN: [id number] PER STATE REGULATIONS 482.30  THIS CHART WAS REVIEWED FOR MEDICAL NECESSITY WITH RESPECT TO THE PATIENT'S ADMISSION/ DURATION OF STAY.  NEXT REVIEW DATE: 03/25/2016  Willa RoughJENNIFER JONES [name], RN, BSN CASE MANAGER

## 2016-03-21 NOTE — Progress Notes (Signed)
Patient ID: [name], male   DOB: [birth year], 55 y.o.   MRN: [id number] [facility] MD Progress Note  03/21/2016 6:03 PM [name]  MRN:  [id number]  Subjective:  Acknowledges some improvement, but still feels depressed . States that auditory hallucinations have become more distant and sporadic . Does not endorse medication side effects.  Objective: I have discussed case with treatment team and have met with patient . At this time patient's major focus is in regards to disposition planning . He wants to go to a group home. As per CSW a group home has expressed interest in evaluating patient and visit, possible acceptance is expected for Monday . As per staff, patient continues to present with a sad, constricted affect at times. Patient does endorse some ongoing depression, but reports partial improvement compared to admission. No disruptive or agitated behaviors on unit.     Principal Problem: Schizoaffective disorder, bipolar type ([place]) Diagnosis:   Patient Active Problem List   Diagnosis Date Noted  . DKA (diabetic ketoacidoses) ([place]) [E13.10] 02/28/2016  . Chest pain [R07.9] 02/28/2016  . AKI (acute kidney injury) ([place]) [N17.9] 02/28/2016  . Suicidal ideation [R45.851] 02/06/2016  . Cannabis use disorder, moderate, dependence ([place]) [F12.20] 02/06/2016  . Schizoaffective disorder, bipolar type ([place]) [F25.0] 02/05/2016  . Hyperlipidemia [E78.5] 01/18/2015  . Hyperprolactinemia ([place]) [E22.1] 01/18/2015  . Diabetes mellitus ([place]) [E11.9] 03/11/2012  . HTN (hypertension) [I10] 03/11/2012   Total Time spent with patient: 20 minutes  Past Psychiatric History: depression, substance use.  Past Medical History:  Past Medical History:  Diagnosis Date  . Anxiety   . Bipolar affective disorder ([place])   . Bipolar disorder ([place]) 03/11/2012  . Depression   . Diabetes mellitus   . Diabetes mellitus ([place]) 03/11/2012  . Hepatitis 06/30/2013   Type C  . Hypertension   . Schizophrenia,  schizo-affective ([place])     Past Surgical History:  Procedure Laterality Date  . FINGER SURGERY    . WISDOM TOOTH EXTRACTION     Family History:  Family History  Problem Relation Age of Onset  . Diabetes Mother   . Bipolar disorder Sister    Family Psychiatric  History: see H&P. Social History:  History  Alcohol Use No     History  Drug Use  . Types: Marijuana, Cocaine    Comment: 4 days ago last THC and cocoaine use... relapsed after 2 yrs     Social History   Social History  . Marital status: Single    Spouse name: N/A  . Number of children: N/A  . Years of education: N/A   Social History Main Topics  . Smoking status: Never Smoker  . Smokeless tobacco: Never Used  . Alcohol use No  . Drug use:     Types: Marijuana, Cocaine     Comment: 4 days ago last THC and cocoaine use... relapsed after 2 yrs   . Sexual activity: Yes    Birth control/ protection: Condom     Comment: marijuana 25 days ago   Other Topics Concern  . None   Social History Narrative  . None   Additional Social History:    Pain Medications: Pt denies.  Prescriptions: Pt denies. Over the Counter: Pt denies.  History of alcohol / drug use?: Yes Longest period of sobriety (when/how long): 21 days. Negative Consequences of Use: Financial, Personal relationships, Work / School Name of Substance 1: Marijuana  1 - Age of First Use: UTA 1 - Amount (size/oz): UTA  1 - Frequency: UTA 1 - Duration: UTA 1 - Last Use / Amount: 21 days ago  Name of Substance 2: Crack  2 - Age of First Use: UTA 2 - Amount (size/oz): UTA 2 - Frequency: UTA 2 - Duration: UTA 2 - Last Use / Amount: 21 days ago.  Sleep: improved   Appetite:  Improving   Current Medications: Current Facility-Administered Medications  Medication Dose Route Frequency Provider Last Rate Last Dose  . acetaminophen (TYLENOL) tablet 650 mg  650 mg Oral Q6H PRN Nanci Pina, FNP   650 mg at 03/17/16 0834  . alum & mag  hydroxide-simeth (MAALOX/MYLANTA) 200-200-20 MG/5ML suspension 30 mL  30 mL Oral Q4H PRN Benjamine Mola, FNP      . glipiZIDE (GLUCOTROL) tablet 10 mg  10 mg Oral BID AC Hampton Abbot, MD   10 mg at 03/21/16 1725  . hydrOXYzine (ATARAX/VISTARIL) tablet 25 mg  25 mg Oral TID PRN Nanci Pina, FNP   25 mg at 03/16/16 1006  . insulin aspart (novoLOG) injection 0-15 Units  0-15 Units Subcutaneous TID WC Benjamine Mola, FNP   8 Units at 03/21/16 1725  . insulin detemir (LEVEMIR) injection 25 Units  25 Units Subcutaneous Daily Benjamine Mola, FNP   25 Units at 03/21/16 0818  . insulin detemir (LEVEMIR) injection 26 Units  26 Units Subcutaneous QHS Laverle Hobby, PA-C   26 Units at 03/20/16 2114  . magnesium hydroxide (MILK OF MAGNESIA) suspension 30 mL  30 mL Oral Daily PRN Nanci Pina, FNP      . metFORMIN (GLUCOPHAGE) tablet 1,000 mg  1,000 mg Oral BID WC Hampton Abbot, MD   1,000 mg at 03/21/16 1725  . simvastatin (ZOCOR) tablet 40 mg  40 mg Oral q1800 Benjamine Mola, FNP   40 mg at 03/21/16 1725  . tuberculin injection 5 Units  5 Units Intradermal Once Jenne Campus, MD   5 Units at 03/20/16 1203  . venlafaxine XR (EFFEXOR-XR) 24 hr capsule 150 mg  150 mg Oral Q breakfast Benjamine Mola, FNP   150 mg at 03/21/16 0818  . ziprasidone (GEODON) capsule 40 mg  40 mg Oral BID WC Benjamine Mola, FNP   40 mg at 03/21/16 1725  . zolpidem (AMBIEN) tablet 10 mg  10 mg Oral QHS PRN Jenne Campus, MD   10 mg at 03/20/16 2114    Lab Results:  Results for orders placed or performed during the hospital encounter of 03/13/16 (from the past 48 hour(s))  Glucose, capillary     Status: Abnormal   Collection Time: 03/19/16  8:01 PM  Result Value Ref Range   Glucose-Capillary 197 (H) 65 - 99 mg/dL  Glucose, capillary     Status: Abnormal   Collection Time: 03/20/16  6:17 AM  Result Value Ref Range   Glucose-Capillary 193 (H) 65 - 99 mg/dL  Glucose, capillary     Status: Abnormal   Collection Time:  03/20/16 11:57 AM  Result Value Ref Range   Glucose-Capillary 156 (H) 65 - 99 mg/dL  Glucose, capillary     Status: Abnormal   Collection Time: 03/20/16  5:04 PM  Result Value Ref Range   Glucose-Capillary 238 (H) 65 - 99 mg/dL  Glucose, capillary     Status: Abnormal   Collection Time: 03/20/16  7:53 PM  Result Value Ref Range   Glucose-Capillary 282 (H) 65 - 99 mg/dL  Glucose, capillary  Status: Abnormal   Collection Time: 03/21/16  6:17 AM  Result Value Ref Range   Glucose-Capillary 155 (H) 65 - 99 mg/dL  Glucose, capillary     Status: Abnormal   Collection Time: 03/21/16 12:01 PM  Result Value Ref Range   Glucose-Capillary 168 (H) 65 - 99 mg/dL   Comment 1 Notify RN    Comment 2 Document in Chart   Glucose, capillary     Status: Abnormal   Collection Time: 03/21/16  5:10 PM  Result Value Ref Range   Glucose-Capillary 293 (H) 65 - 99 mg/dL   Comment 1 Notify RN    Comment 2 Document in Chart     Blood Alcohol level:  Lab Results  Component Value Date   ETH <5 03/12/2016   ETH <5 63/84/5364    Metabolic Disorder Labs: Lab Results  Component Value Date   HGBA1C 14.2 (H) 03/18/2016   MPG 361 03/18/2016   MPG 275 08/03/2015   Lab Results  Component Value Date   PROLACTIN 21.5 (H) 03/18/2016   PROLACTIN 3.4 (L) 02/08/2016   Lab Results  Component Value Date   CHOL 193 03/18/2016   TRIG 102 03/18/2016   HDL 48 03/18/2016   CHOLHDL 4.0 03/18/2016   VLDL 20 03/18/2016   LDLCALC 125 (H) 03/18/2016   LDLCALC 146 (H) 02/08/2016    Physical Findings: AIMS: Facial and Oral Movements Muscles of Facial Expression: None, normal Lips and Perioral Area: None, normal Jaw: None, normal Tongue: None, normal,Extremity Movements Upper (arms, wrists, hands, fingers): None, normal Lower (legs, knees, ankles, toes): None, normal, Trunk Movements Neck, shoulders, hips: None, normal, Overall Severity Severity of abnormal movements (highest score from questions above):  None, normal Incapacitation due to abnormal movements: None, normal Patient's awareness of abnormal movements (rate only patient's report): No Awareness, Dental Status Current problems with teeth and/or dentures?: No Does patient usually wear dentures?: No  CIWA:    COWS:     Musculoskeletal: Strength & Muscle Tone: within normal limits Gait & Station: normal Patient leans: N/A  Psychiatric Specialty Exam: Physical Exam  Nursing note and vitals reviewed.   Review of Systems  Psychiatric/Behavioral: Positive for depression, substance abuse and suicidal ideas.  All other systems reviewed and are negative. denies headache, denies chest pain, no shortness of breath, no vomiting   Blood pressure 126/88, pulse 77, temperature 98 F (36.7 C), temperature source Oral, resp. rate 16, height _0  (1.727 m), weight 205 lb (93 kg), SpO2 100 %.Body mass index is 31.17 kg/m.  General Appearance: fairly groomed   Eye Contact:  Good  Speech:  Clear and Coherent  Volume:  Normal  Mood:  Remains depressed, but improved compared to admission   Affect:  Constricted but reactive affect   Thought Process:  Goal Directed  Orientation:  Full (Time, Place, and Person)  Thought Content: states hallucinations now infrequent, does not appear internally preoccupied, no delusional thoughts noted   Suicidal Thoughts: denies suicidal plan or intention at this time and contracts for safety on unit   Homicidal Thoughts:  No  Memory: recent and remote grossly intact   Judgement:  Improving   Insight:    Improving   Psychomotor Activity:  Normal  Concentration:  Concentration: Good and Attention Span: Good  Recall:  Good  Fund of Knowledge:  Good  Language:  Good  Akathisia:  No  Handed:  Right  AIMS (if indicated):     Assets:  Communication Skills Desire for Improvement  Financial Resources/Insurance Resilience Social Support  ADL's:  Intact  Cognition:  WNL  Sleep:  Number of Hours: 6.75     Assessment - partial mood improvement compared to admission. Still presenting with some depression and constricted affect but denies suicidal ideations and presents future oriented, focusing on disposition .  Denies medication side effects. Sleep significantly improved  Treatment Plan Summary: Daily contact with patient to assess and evaluate symptoms and progress in treatment and Medication management   Encourage ongoing group and milieu participation to work on coping skills and symptom reduction  Continue to encourage efforts to maintain sobriety and minimize relapse risk  Continue Geodon 40 mgrs BID for mood disorder and psychotic symptoms Continue Effexor XR 150 mgrs QDAY for depression  Continue  Ambien10 mgrs QHS PRN for insomnia as needed  Continue DM management  Treatment team working on disposition planning, as above  PPD reading in 2 days   Neita Garnet, MD 03/21/2016, 6:03 PM

## 2016-03-21 NOTE — Progress Notes (Signed)
Recreation Therapy Notes  Date: 03/21/16 Time: 0945 Location: [address] Group Room  Group Topic: Stress Management  Goal Area(s) Addresses:  Patient will verbalize importance of using healthy stress management.  Patient will identify positive emotions associated with healthy stress management.   Intervention: Stress Management  Activity :  Progressive Muscle Relaxation.  LRT introduced the technique of progressive muscle relaxation to patients.  Patients were to follow along as LRT read script to engaged in the technique.  Education:  Stress Management, Discharge Planning.   Education Outcome: Acknowledges edcuation/In group clarification offered/Needs additional education  Clinical Observations/Feedback: Pt did not attend group.    Caroll RancherMarjette Aidee Latimore, LRT/CTRS    Caroll RancherLindsay, Betzalel Umbarger A 03/21/2016 12:45 PM

## 2016-03-22 ENCOUNTER — Encounter (HOSPITAL_COMMUNITY): Payer: Self-pay | Admitting: Registered Nurse

## 2016-03-22 LAB — GLUCOSE, CAPILLARY
GLUCOSE-CAPILLARY: 206 mg/dL — AB (ref 65–99)
GLUCOSE-CAPILLARY: 239 mg/dL — AB (ref 65–99)
Glucose-Capillary: 203 mg/dL — ABNORMAL HIGH (ref 65–99)
Glucose-Capillary: 173 mg/dL — ABNORMAL HIGH (ref 65–99)

## 2016-03-22 NOTE — Progress Notes (Signed)
Adult Psychoeducational Group Note  Date:  03/22/2016 Time:  10:37 PM  Group Topic/Focus:  Wrap-Up Group:   The focus of this group is to help patients review their daily goal of treatment and discuss progress on daily workbooks.   Participation Level:  Active  Participation Quality:  Appropriate  Affect:  Appropriate  Cognitive:  Alert  Insight: Appropriate  Engagement in Group:  Engaged  Modes of Intervention:  Discussion  Additional Comments:  Patient reported that he wanted to attend groups.  Patient confirmed working on his discharge plan.  Patient reported he intended on taking his medication.   Elmore GuiseSLOAN, Alzada Brazee N 03/22/2016, 10:37 PM

## 2016-03-22 NOTE — Progress Notes (Signed)
PPD read on L forearm and no induration noted. TB test negative.

## 2016-03-22 NOTE — Progress Notes (Signed)
Patient ID: [name], male   DOB: 04-21-1961, 55 y.o.   MRN: [id number] [facility] MD Progress Note  03/22/2016 5:17 PM Alcario Tinkey  MRN:  [id number]  Subjective:  "My tooth is hurting really bad today" Patient seen by this provider and chart reviewed 03/22/16.  On evaluation:  Ed Mandich patient is in bed.  States that he has a toothache the reason he is laying down.  Reports that he hasn't asked nurse for medication and doesn't want anything for the toothache.  Patient states that he has been doing better and denies suicidal/homicidal ideation, psychosis, and paranoia.  States that he slept well last night but is not eating well related to the toothache.  Reports that he is tolerating medications without adverse reaction and has attended and participated in group sessions.      Principal Problem: Schizoaffective disorder, bipolar type ([facility]) Diagnosis:   Patient Active Problem List   Diagnosis Date Noted  . DKA (diabetic ketoacidoses) ([facility]) [E13.10] 02/28/2016  . Chest pain [R07.9] 02/28/2016  . AKI (acute kidney injury) ([facility]) [N17.9] 02/28/2016  . Suicidal ideation [R45.851] 02/06/2016  . Cannabis use disorder, moderate, dependence ([facility]) [F12.20] 02/06/2016  . Schizoaffective disorder, bipolar type ([facility]) [F25.0] 02/05/2016  . Hyperlipidemia [E78.5] 01/18/2015  . Hyperprolactinemia ([facility]) [E22.1] 01/18/2015  . Diabetes mellitus ([facility]) [E11.9] 03/11/2012  . HTN (hypertension) [I10] 03/11/2012   Total Time spent with patient: 15 minutes  Past Psychiatric History: depression, substance use.  Past Medical History:  Past Medical History:  Diagnosis Date  . Anxiety   . Bipolar affective disorder ([facility])   . Bipolar disorder ([facility]) 03/11/2012  . Depression   . Diabetes mellitus   . Diabetes mellitus ([facility]) 03/11/2012  . Hepatitis 06/30/2013   Type C  . Hypertension   . Schizophrenia, schizo-affective ([facility])     Past Surgical History:  Procedure Laterality Date  . FINGER SURGERY    .  WISDOM TOOTH EXTRACTION     Family History:  Family History  Problem Relation Age of Onset  . Diabetes Mother   . Bipolar disorder Sister    Family Psychiatric  History: see H&P. Social History:  History  Alcohol Use No     History  Drug Use  . Types: Marijuana, Cocaine    Comment: 4 days ago last THC and cocoaine use... relapsed after 2 yrs     Social History   Social History  . Marital status: Single    Spouse name: N/A  . Number of children: N/A  . Years of education: N/A   Social History Main Topics  . Smoking status: Never Smoker  . Smokeless tobacco: Never Used  . Alcohol use No  . Drug use:     Types: Marijuana, Cocaine     Comment: 4 days ago last THC and cocoaine use... relapsed after 2 yrs   . Sexual activity: Yes    Birth control/ protection: Condom     Comment: marijuana 25 days ago   Other Topics Concern  . None   Social History Narrative  . None   Additional Social History:    Pain Medications: Pt denies.  Prescriptions: Pt denies. Over the Counter: Pt denies.  History of alcohol / drug use?: Yes Longest period of sobriety (when/how long): 21 days. Negative Consequences of Use: Financial, Personal relationships, Work / School Name of Substance 1: Marijuana  1 - Age of First Use: UTA 1 - Amount (size/oz): UTA 1 - Frequency: UTA 1 - Duration: UTA 1 -  Last Use / Amount: 21 days ago  Name of Substance 2: Crack  2 - Age of First Use: UTA 2 - Amount (size/oz): UTA 2 - Frequency: UTA 2 - Duration: UTA 2 - Last Use / Amount: 21 days ago.  Sleep: improved   Appetite:  Fair.  Complaints of toothache   Current Medications: Current Facility-Administered Medications  Medication Dose Route Frequency Provider Last Rate Last Dose  . acetaminophen (TYLENOL) tablet 650 mg  650 mg Oral Q6H PRN Truman Hayward, FNP   650 mg at 03/17/16 0834  . alum & mag hydroxide-simeth (MAALOX/MYLANTA) 200-200-20 MG/5ML suspension 30 mL  30 mL Oral Q4H PRN Beau Fanny, FNP      . glipiZIDE (GLUCOTROL) tablet 10 mg  10 mg Oral BID AC Nelly Rout, MD   10 mg at 03/22/16 1713  . hydrOXYzine (ATARAX/VISTARIL) tablet 25 mg  25 mg Oral TID PRN Truman Hayward, FNP   25 mg at 03/16/16 1006  . insulin aspart (novoLOG) injection 0-15 Units  0-15 Units Subcutaneous TID WC Beau Fanny, FNP   3 Units at 03/22/16 1712  . insulin detemir (LEVEMIR) injection 25 Units  25 Units Subcutaneous Daily Beau Fanny, FNP   25 Units at 03/22/16 [phone]  . insulin detemir (LEVEMIR) injection 26 Units  26 Units Subcutaneous QHS Kerry Hough, PA-C   26 Units at 03/21/16 2121  . magnesium hydroxide (MILK OF MAGNESIA) suspension 30 mL  30 mL Oral Daily PRN Truman Hayward, FNP      . metFORMIN (GLUCOPHAGE) tablet 1,000 mg  1,000 mg Oral BID WC Nelly Rout, MD   1,000 mg at 03/22/16 1712  . simvastatin (ZOCOR) tablet 40 mg  40 mg Oral q1800 Beau Fanny, FNP   40 mg at 03/22/16 1713  . venlafaxine XR (EFFEXOR-XR) 24 hr capsule 150 mg  150 mg Oral Q breakfast Beau Fanny, FNP   150 mg at 03/22/16 0841  . ziprasidone (GEODON) capsule 40 mg  40 mg Oral BID WC Beau Fanny, FNP   40 mg at 03/22/16 1713  . zolpidem (AMBIEN) tablet 10 mg  10 mg Oral QHS PRN Craige Cotta, MD   10 mg at 03/21/16 2122    Lab Results:  Results for orders placed or performed during the hospital encounter of 03/13/16 (from the past 48 hour(s))  Glucose, capillary     Status: Abnormal   Collection Time: 03/20/16  7:53 PM  Result Value Ref Range   Glucose-Capillary 282 (H) 65 - 99 mg/dL  Glucose, capillary     Status: Abnormal   Collection Time: 03/21/16  6:17 AM  Result Value Ref Range   Glucose-Capillary 155 (H) 65 - 99 mg/dL  Glucose, capillary     Status: Abnormal   Collection Time: 03/21/16 12:01 PM  Result Value Ref Range   Glucose-Capillary 168 (H) 65 - 99 mg/dL   Comment 1 Notify RN    Comment 2 Document in Chart   Glucose, capillary     Status: Abnormal   Collection Time:  03/21/16  5:10 PM  Result Value Ref Range   Glucose-Capillary 293 (H) 65 - 99 mg/dL   Comment 1 Notify RN    Comment 2 Document in Chart   Glucose, capillary     Status: Abnormal   Collection Time: 03/21/16  9:05 PM  Result Value Ref Range   Glucose-Capillary 243 (H) 65 - 99 mg/dL   Comment 1  Notify RN   Glucose, capillary     Status: Abnormal   Collection Time: 03/22/16  5:52 AM  Result Value Ref Range   Glucose-Capillary 206 (H) 65 - 99 mg/dL   Comment 1 Notify RN   Glucose, capillary     Status: Abnormal   Collection Time: 03/22/16 11:35 AM  Result Value Ref Range   Glucose-Capillary 203 (H) 65 - 99 mg/dL   Comment 1 Notify RN    Comment 2 Document in Chart   Glucose, capillary     Status: Abnormal   Collection Time: 03/22/16  4:57 PM  Result Value Ref Range   Glucose-Capillary 173 (H) 65 - 99 mg/dL   Comment 1 Notify RN    Comment 2 Document in Chart     Blood Alcohol level:  Lab Results  Component Value Date   ETH <5 03/12/2016   ETH <5 03/12/2016    Metabolic Disorder Labs: Lab Results  Component Value Date   HGBA1C 14.2 (H) 03/18/2016   MPG 361 03/18/2016   MPG 275 08/03/2015   Lab Results  Component Value Date   PROLACTIN 21.5 (H) 03/18/2016   PROLACTIN 3.4 (L) 02/08/2016   Lab Results  Component Value Date   CHOL 193 03/18/2016   TRIG 102 03/18/2016   HDL 48 03/18/2016   CHOLHDL 4.0 03/18/2016   VLDL 20 03/18/2016   LDLCALC 125 (H) 03/18/2016   LDLCALC 146 (H) 02/08/2016    Physical Findings: AIMS: Facial and Oral Movements Muscles of Facial Expression: None, normal Lips and Perioral Area: None, normal Jaw: None, normal Tongue: None, normal,Extremity Movements Upper (arms, wrists, hands, fingers): None, normal Lower (legs, knees, ankles, toes): None, normal, Trunk Movements Neck, shoulders, hips: None, normal, Overall Severity Severity of abnormal movements (highest score from questions above): None, normal Incapacitation due to abnormal  movements: None, normal Patient's awareness of abnormal movements (rate only patient's report): No Awareness, Dental Status Current problems with teeth and/or dentures?: No Does patient usually wear dentures?: No  CIWA:    COWS:     Musculoskeletal: Strength & Muscle Tone: within normal limits Gait & Station: normal Patient leans: N/A  Psychiatric Specialty Exam: Physical Exam  Nursing note and vitals reviewed.   Review of Systems  Psychiatric/Behavioral: Positive for depression, substance abuse and suicidal ideas.  All other systems reviewed and are negative. denies headache, denies chest pain, no shortness of breath, no vomiting   Blood pressure (!) 144/84, pulse 75, temperature 97.8 F (36.6 C), temperature source Oral, resp. rate 19, height 5\' 8"  (1.727 m), weight 93 kg (205 lb), SpO2 100 %.Body mass index is 31.17 kg/m.  General Appearance: fairly groomed   Eye Contact:  Good  Speech:  Clear and Coherent  Volume:  Normal  Mood:  Remains depressed, but improved compared to admission   Affect:  Constricted but reactive affect   Thought Process:  Goal Directed  Orientation:  Full (Time, Place, and Person)  Thought Content:  At this time denies hallucinations:  does not appear internally preoccupied, no delusional thoughts noted   Suicidal Thoughts: denies suicidal plan or intention at this time and contracts for safety on unit   Homicidal Thoughts:  No  Memory: recent and remote grossly intact   Judgement:  Improving   Insight:    Improving   Psychomotor Activity:  Normal  Concentration:  Concentration: Good and Attention Span: Good  Recall:  Good  Fund of Knowledge:  Good  Language:  Good  Akathisia:  No  Handed:  Right  AIMS (if indicated):     Assets:  Communication Skills Desire for Improvement Financial Resources/Insurance Resilience Social Support  ADL's:  Intact  Cognition:  WNL  Sleep:  Number of Hours: 6.5    Assessment - Improvement in mood but  continue to appear depressed and constricted affect but denies suicidal ideations. Denies medication side effects. Sleep significantly improved    Treatment Plan Summary: Daily contact with patient to assess and evaluate symptoms and progress in treatment and Medication management   Encourage ongoing group and milieu participation to work on coping skills and symptom reduction  Continue to encourage efforts to maintain sobriety and minimize relapse risk  Continue Geodon 40 mgrs BID for mood disorder and psychotic symptoms Continue Effexor XR 150 mgrs QDAY for depression  Continue  Ambien 10 mgrs QHS PRN for insomnia as needed  Continue DM management  Treatment team working on disposition planning, as above  PPD reading in 2 days   Rankin, Shuvon, NP 03/22/2016, 5:17 PM   Agree with NP Progress Note as above

## 2016-03-22 NOTE — Progress Notes (Signed)
Patient did not attend wrap up group. 

## 2016-03-22 NOTE — [facility] Group Notes (Signed)
[facility] Group Notes:  (Nursing/MHT/Case Management/Adjunct)  Date:  03/22/2016  Time:  0930  Type of Therapy:  Nurse Education - Coping Skills and Strategies  Participation Level:  Active  Participation Quality:  Attentive  Affect:  Appropriate  Cognitive:  Alert  Insight:  Improving  Engagement in Group:  Engaged  Modes of Intervention:  Discussion, Education and Support  Summary of Progress/Problems: Patient shared he uses "walking and playing with grand kids to mellow out. It also helps me be grateful."   Lawrence MarseillesFriedman, Jovonta Levit Eakes 03/22/2016, 5:48 PM

## 2016-03-22 NOTE — [facility] Group Notes (Signed)
[facility] LCSW Group Therapy  03/22/2016 10:00 until 11:00 AM  Type of Therapy:  Group Therapy  Participation Level:  Active  Participation Quality:  Attentive and Sharing  Affect:  Appropriate  Cognitive:  Appropriate  Insight:  Developing/Improving  Engagement in Therapy:  Developing/Improving  Modes of Intervention:  Discussion, Exploration, Rapport Building, Socialization and Support  Summary of Progress/Problems:  The main focus of today's process group was for the patient to identify ways in which they have in the past sabotaged their own recovery. Motivational Interviewing was utilized to ask the group members what they get out of their self sabotaging behavior(s), and what reasons they may have for wanting to change. The Stages of Change were explained using a handout, and patients identified where they currently are with regard to stages of change.Patient was attentive and shared he feels the stay here has been helpful. Patient reports he once again finds himself in preparation stage after a substance abuse relapse. Patient is preparing to avoid isolation and negative thinking.    Carney Bernatherine C Harrill, LCSW

## 2016-03-22 NOTE — Progress Notes (Signed)
D: Patient up and visible in the milieu at times, otherwise napping in room. Spoke with patient 1:1. Rates sleep as good, appetite as fair, energy as normal and concentration as good. Patient's affect blunted though he brightens with interaction. Mood depressed but pleasant. Rating his depression, hopelessness and anxiety at a 5/10. States goal for today is to "work on placement." Denies pain, physical problems.   A: Medicated per orders, no prns requested/needed. Emotional support offered and self inventory reviewed.    R: Patient denies SI/HI and remains safe on level III obs.

## 2016-03-23 LAB — GLUCOSE, CAPILLARY
GLUCOSE-CAPILLARY: 189 mg/dL — AB (ref 65–99)
GLUCOSE-CAPILLARY: 235 mg/dL — AB (ref 65–99)
Glucose-Capillary: 209 mg/dL — ABNORMAL HIGH (ref 65–99)
Glucose-Capillary: 219 mg/dL — ABNORMAL HIGH (ref 65–99)

## 2016-03-23 NOTE — Progress Notes (Signed)
D: Writer met with pt tonight to discuss treatment and to address any concerns. Pt stated that he had a good day today. Pt reported decreased depression, no suicidal or homicidal thoughts and no AVH tonight. Pt reported that he have an interview set up for Monday to discuss a possible group home. No complaints verbalized by pt tonight. Pt declined sleep med when offered. Pt requested insulin at 2100 so that he could get ready for bed. Blood sugar checked and insulin given at pt request. A: Medications discussed with pt. Verbal support provided. 15 minute checks performed for safety. Pt encouraged to speak with nurse if he starts to have symptoms of hypoglycemia.  R: Pt receptive to tx.

## 2016-03-23 NOTE — Progress Notes (Addendum)
Patient ID: [name], male   DOB: [birth year], 55 y.o.   MRN: [id number] [facility] MD Progress Note  03/23/2016 3:32 PM Haron Beilke  MRN:  [id number]  Subjective:  Patient is reporting feeling better, and states that at this time he is feeling more comfortable and less apprehensive about discharging soon. His hope is that Group Home representative will come tomorrow to evaluate him and that he will be accepted into group home, but states " if they don't take me or they do not come, I have decided to go live with my mother in [place]". Denies medication side effects. Today does not complain of toothache and does not appear to be in any acute pain or distress  Objective : I have reviewed chart notes and met with patient. As above, mood improving and today minimizes depression and presents with fuller range of affect . States hallucinations have resolved, does not appear internally preoccupied. Future orirnted and focused on discharge planning as above  No disruptive or agitated behaviors on unit, visible in day room. Tolerating medications well .       Principal Problem: Schizoaffective disorder, bipolar type ([place]) Diagnosis:   Patient Active Problem List   Diagnosis Date Noted  . DKA (diabetic ketoacidoses) ([place]) [E13.10] 02/28/2016  . Chest pain [R07.9] 02/28/2016  . AKI (acute kidney injury) ([place]) [N17.9] 02/28/2016  . Suicidal ideation [R45.851] 02/06/2016  . Cannabis use disorder, moderate, dependence ([place]) [F12.20] 02/06/2016  . Schizoaffective disorder, bipolar type ([place]) [F25.0] 02/05/2016  . Hyperlipidemia [E78.5] 01/18/2015  . Hyperprolactinemia ([place]) [E22.1] 01/18/2015  . Diabetes mellitus ([place]) [E11.9] 03/11/2012  . HTN (hypertension) [I10] 03/11/2012   Total Time spent with patient:  20 minutes   Past Psychiatric History: depression, substance use.  Past Medical History:  Past Medical History:  Diagnosis Date  . Anxiety   . Bipolar affective disorder ([place])   .  Bipolar disorder ([place]) 03/11/2012  . Depression   . Diabetes mellitus   . Diabetes mellitus ([place]) 03/11/2012  . Hepatitis 06/30/2013   Type C  . Hypertension   . Schizophrenia, schizo-affective ([place])     Past Surgical History:  Procedure Laterality Date  . FINGER SURGERY    . WISDOM TOOTH EXTRACTION     Family History:  Family History  Problem Relation Age of Onset  . Diabetes Mother   . Bipolar disorder Sister    Family Psychiatric  History: see H&P. Social History:  History  Alcohol Use No     History  Drug Use  . Types: Marijuana, Cocaine    Comment: 4 days ago last THC and cocoaine use... relapsed after 2 yrs     Social History   Social History  . Marital status: Single    Spouse name: N/A  . Number of children: N/A  . Years of education: N/A   Social History Main Topics  . Smoking status: Never Smoker  . Smokeless tobacco: Never Used  . Alcohol use No  . Drug use:     Types: Marijuana, Cocaine     Comment: 4 days ago last THC and cocoaine use... relapsed after 2 yrs   . Sexual activity: Yes    Birth control/ protection: Condom     Comment: marijuana 25 days ago   Other Topics Concern  . None   Social History Narrative  . None   Additional Social History:    Pain Medications: Pt denies.  Prescriptions: Pt denies. Over the Counter: Pt denies.  History of alcohol /  drug use?: Yes Longest period of sobriety (when/how long): 21 days. Negative Consequences of Use: Financial, Personal relationships, Work / School Name of Substance 1: Marijuana  1 - Age of First Use: UTA 1 - Amount (size/oz): UTA 1 - Frequency: UTA 1 - Duration: UTA 1 - Last Use / Amount: 21 days ago  Name of Substance 2: Crack  2 - Age of First Use: UTA 2 - Amount (size/oz): UTA 2 - Frequency: UTA 2 - Duration: UTA 2 - Last Use / Amount: 21 days ago.  Sleep: improved   Appetite:  Improved   Current Medications: Current Facility-Administered Medications  Medication Dose  Route Frequency Provider Last Rate Last Dose  . acetaminophen (TYLENOL) tablet 650 mg  650 mg Oral Q6H PRN Nanci Pina, FNP   650 mg at 03/17/16 0834  . alum & mag hydroxide-simeth (MAALOX/MYLANTA) 200-200-20 MG/5ML suspension 30 mL  30 mL Oral Q4H PRN Benjamine Mola, FNP      . glipiZIDE (GLUCOTROL) tablet 10 mg  10 mg Oral BID AC [place] Abbot, MD   10 mg at 03/23/16 0636  . hydrOXYzine (ATARAX/VISTARIL) tablet 25 mg  25 mg Oral TID PRN Nanci Pina, FNP   25 mg at 03/16/16 1006  . insulin aspart (novoLOG) injection 0-15 Units  0-15 Units Subcutaneous TID WC Benjamine Mola, FNP   5 Units at 03/23/16 1212  . insulin detemir (LEVEMIR) injection 25 Units  25 Units Subcutaneous Daily Benjamine Mola, FNP   25 Units at 03/23/16 0831  . insulin detemir (LEVEMIR) injection 26 Units  26 Units Subcutaneous QHS Laverle Hobby, PA-C   26 Units at 03/22/16 2100  . magnesium hydroxide (MILK OF MAGNESIA) suspension 30 mL  30 mL Oral Daily PRN Nanci Pina, FNP      . metFORMIN (GLUCOPHAGE) tablet 1,000 mg  1,000 mg Oral BID WC Hampton Abbot, MD   1,000 mg at 03/23/16 0829  . simvastatin (ZOCOR) tablet 40 mg  40 mg Oral q1800 Benjamine Mola, FNP   40 mg at 03/22/16 1713  . venlafaxine XR (EFFEXOR-XR) 24 hr capsule 150 mg  150 mg Oral Q breakfast Benjamine Mola, FNP   150 mg at 03/23/16 7616  . ziprasidone (GEODON) capsule 40 mg  40 mg Oral BID WC Benjamine Mola, FNP   40 mg at 03/23/16 0737  . zolpidem (AMBIEN) tablet 10 mg  10 mg Oral QHS PRN Jenne Campus, MD   10 mg at 03/21/16 2122    Lab Results:  Results for orders placed or performed during the hospital encounter of 03/13/16 (from the past 48 hour(s))  Glucose, capillary     Status: Abnormal   Collection Time: 03/21/16  5:10 PM  Result Value Ref Range   Glucose-Capillary 293 (H) 65 - 99 mg/dL   Comment 1 Notify RN    Comment 2 Document in Chart   Glucose, capillary     Status: Abnormal   Collection Time: 03/21/16  9:05 PM  Result  Value Ref Range   Glucose-Capillary 243 (H) 65 - 99 mg/dL   Comment 1 Notify RN   Glucose, capillary     Status: Abnormal   Collection Time: 03/22/16  5:52 AM  Result Value Ref Range   Glucose-Capillary 206 (H) 65 - 99 mg/dL   Comment 1 Notify RN   Glucose, capillary     Status: Abnormal   Collection Time: 03/22/16 11:35 AM  Result Value Ref Range  Glucose-Capillary 203 (H) 65 - 99 mg/dL   Comment 1 Notify RN    Comment 2 Document in Chart   Glucose, capillary     Status: Abnormal   Collection Time: 03/22/16  4:57 PM  Result Value Ref Range   Glucose-Capillary 173 (H) 65 - 99 mg/dL   Comment 1 Notify RN    Comment 2 Document in Chart   Glucose, capillary     Status: Abnormal   Collection Time: 03/22/16  8:40 PM  Result Value Ref Range   Glucose-Capillary 239 (H) 65 - 99 mg/dL   Comment 1 Notify RN    Comment 2 Document in Chart   Glucose, capillary     Status: Abnormal   Collection Time: 03/23/16  6:03 AM  Result Value Ref Range   Glucose-Capillary 189 (H) 65 - 99 mg/dL  Glucose, capillary     Status: Abnormal   Collection Time: 03/23/16 11:29 AM  Result Value Ref Range   Glucose-Capillary 209 (H) 65 - 99 mg/dL    Blood Alcohol level:  Lab Results  Component Value Date   ETH <5 03/12/2016   ETH <5 35/45/6256    Metabolic Disorder Labs: Lab Results  Component Value Date   HGBA1C 14.2 (H) 03/18/2016   MPG 361 03/18/2016   MPG 275 08/03/2015   Lab Results  Component Value Date   PROLACTIN 21.5 (H) 03/18/2016   PROLACTIN 3.4 (L) 02/08/2016   Lab Results  Component Value Date   CHOL 193 03/18/2016   TRIG 102 03/18/2016   HDL 48 03/18/2016   CHOLHDL 4.0 03/18/2016   VLDL 20 03/18/2016   LDLCALC 125 (H) 03/18/2016   LDLCALC 146 (H) 02/08/2016    Physical Findings: AIMS: Facial and Oral Movements Muscles of Facial Expression: None, normal Lips and Perioral Area: None, normal Jaw: None, normal Tongue: None, normal,Extremity Movements Upper (arms,  wrists, hands, fingers): None, normal Lower (legs, knees, ankles, toes): None, normal, Trunk Movements Neck, shoulders, hips: None, normal, Overall Severity Severity of abnormal movements (highest score from questions above): None, normal Incapacitation due to abnormal movements: None, normal Patient's awareness of abnormal movements (rate only patient's report): No Awareness, Dental Status Current problems with teeth and/or dentures?: No Does patient usually wear dentures?: No  CIWA:    COWS:     Musculoskeletal: Strength & Muscle Tone: within normal limits Gait & Station: normal Patient leans: N/A  Psychiatric Specialty Exam: Physical Exam  Nursing note and vitals reviewed.   Review of Systems  Psychiatric/Behavioral: Positive for depression, substance abuse and suicidal ideas.  All other systems reviewed and are negative. denies headache, denies chest pain, no shortness of breath, no vomiting   Blood pressure 118/76, pulse 70, temperature 98.4 F (36.9 C), temperature source Oral, resp. rate 16, height 5' 8"  (1.727 m), weight 205 lb (93 kg), SpO2 100 %.Body mass index is 31.17 kg/m.  General Appearance: improved grooming   Eye Contact:  Good  Speech:  Clear and Coherent  Volume:  Normal  Mood:  Improved, minimizes depression at this time  Affect:  More  reactive affect   Thought Process:  Goal Directed  Orientation:  Full (Time, Place, and Person)  Thought Content:  At this time denies hallucinations:  does not appear internally preoccupied, no delusional thoughts noted   Suicidal Thoughts: denies suicidal plan or intention at this time and contracts for safety on unit   Homicidal Thoughts:  No  Memory: recent and remote grossly intact   Judgement:  Improving   Insight:    Improving   Psychomotor Activity:  Normal  Concentration:  Concentration: Good and Attention Span: Good  Recall:  Good  Fund of Knowledge:  Good  Language:  Good  Akathisia:  No  Handed:  Right   AIMS (if indicated):     Assets:  Communication Skills Desire for Improvement Financial Resources/Insurance Resilience Social Support  ADL's:  Intact  Cognition:  WNL  Sleep:  Number of Hours: 5    Assessment - Continues to improve and presents with improved mood, improved range of affect, and no current psychotic symptoms. Tolerating medications well , at this time focusing on disposition planning options,hoping for acceptance into a group home, but planning to go live with mother if [facility] plan does not work .   Treatment Plan Summary: Daily contact with patient to assess and evaluate symptoms and progress in treatment and Medication management   Encourage ongoing group and milieu participation to work on coping skills and symptom reduction  Continue to encourage efforts to maintain sobriety and minimize relapse risk  Continue Geodon 40 mgrs BID for mood disorder and psychotic symptoms Continue Effexor XR 150 mgrs QDAY for depression  Continue  Ambien 10 mgrs QHS PRN for insomnia as needed  Continue DM management  Treatment team working on disposition planning, as above - consider discharge soon once disposition is clarified .   Neita Garnet, MD 03/23/2016, 3:32 PM

## 2016-03-23 NOTE — Plan of Care (Signed)
Problem: Education: Goal: Will be free of psychotic symptoms Outcome: Progressing Pt denies AVH. Pt do not appear to be responding to internal stimuli.

## 2016-03-23 NOTE — Progress Notes (Signed)
[name] is most talkative this evening. He remained his quiet, disconnected slef until after dinner tonight...he suddenly started to talk and engage in conversation with several other p[atients in the 400 hall dayroom. A HE completed his daily assessment and on ti he wrote he deneid SI today and he rated hisd epression, hopelessness and axneity " 4/4/4", repsectively. R Safety is in place. Pt is encouraged to be hosnest with hisf eelings to ask for help and to allow  Staff to help him.

## 2016-03-23 NOTE — [facility] Group Notes (Signed)
    [facility] LCSW Group Therapy  03/23/2016 10:00 until 10:55 AM  Type of Therapy:  Group Therapy  Participation Level:  Active  Participation Quality:  Attentive and Sharing  Affect:  Flat  Cognitive:  Alert and Oriented  Insight:  Developing/Improving  Engagement in Therapy:  Developing/Improving  Modes of Intervention:  Clarification, Discussion, Socialization and Support  Summary of Progress/Problems: Topic for today was thoughts and feelings regarding discharge. We discussed fears of upcoming changes including judgements, expectations and stigma of mental health issues. We then discussed supports: what constitutes a supportive framework, identification of supports and what to do when others are not supportive.  Patients then processed the concept of "It's only a thought, and a thought can be changed." Patient came in to group at mid point yet did join in discussion. He shared he does differently depending on whom he shares ghis time with. Patient committed to attending s 12 step oriented group daily before one PM as he does best with that routine.    Carney Bernatherine C Harrill, LCSW

## 2016-03-23 NOTE — [facility] Group Notes (Signed)
[facility] Group Notes:  (Nursing/MHT/Case Management/Adjunct)  Date:  03/23/2016  Time:  10:49 AM  Type of Therapy:  Nurse Education  Participation Level:  Active  Participation Quality:  Appropriate  Affect:  Appropriate  Cognitive:  Appropriate  Insight:  Appropriate  Engagement in Group:  Engaged  Modes of Intervention:  Discussion, Education and Support  Summary of Progress/Problems: [name] shared that his goal is to attend all groups.  Maurine SimmeringShugart, Christinna Sprung M 03/23/2016, 10:49 AM

## 2016-03-23 NOTE — Progress Notes (Signed)
Recommendations placed on 03/20/16 by DM coordinator. No meal coverage added. Writer will inform oncoming nurse in the am to follow up with NP or MD regarding recommendations. Copy printed off and will be given to oncoming nurse at 7 am. Liborio NixonPatrice Ilia Engelbert, RN., 03/23/16 at 04:14 am.

## 2016-03-24 DIAGNOSIS — Z818 Family history of other mental and behavioral disorders: Secondary | ICD-10-CM

## 2016-03-24 DIAGNOSIS — Z833 Family history of diabetes mellitus: Secondary | ICD-10-CM

## 2016-03-24 DIAGNOSIS — Z794 Long term (current) use of insulin: Secondary | ICD-10-CM

## 2016-03-24 DIAGNOSIS — Z79899 Other long term (current) drug therapy: Secondary | ICD-10-CM

## 2016-03-24 LAB — GLUCOSE, CAPILLARY: GLUCOSE-CAPILLARY: 151 mg/dL — AB (ref 65–99)

## 2016-03-24 MED ORDER — ZOLPIDEM TARTRATE 10 MG PO TABS: 10.0000 mg | ORAL_TABLET | Freq: Every evening | ORAL | 0 refills | Status: DC | PRN

## 2016-03-24 MED ORDER — METFORMIN HCL 1000 MG PO TABS: 1000.0000 mg | ORAL_TABLET | Freq: Two times a day (BID) | ORAL | 0 refills | Status: DC

## 2016-03-24 MED ORDER — ZIPRASIDONE HCL 40 MG PO CAPS: 40.0000 mg | ORAL_CAPSULE | Freq: Two times a day (BID) | ORAL | 0 refills | Status: DC

## 2016-03-24 MED ORDER — VENLAFAXINE HCL ER 150 MG PO CP24: 150.0000 mg | ORAL_CAPSULE | Freq: Every day | ORAL | 0 refills | Status: DC

## 2016-03-24 MED ORDER — INSULIN DETEMIR 100 UNIT/ML ~~LOC~~ SOLN: 25.0000 [IU] | Freq: Every day | SUBCUTANEOUS | 11 refills | Status: DC

## 2016-03-24 MED ORDER — GLIPIZIDE 10 MG PO TABS: 10.0000 mg | ORAL_TABLET | Freq: Two times a day (BID) | ORAL | 0 refills | Status: DC

## 2016-03-24 MED ORDER — HYDROXYZINE HCL 25 MG PO TABS: 25.0000 mg | ORAL_TABLET | Freq: Three times a day (TID) | ORAL | 0 refills | Status: DC | PRN

## 2016-03-24 MED ORDER — INSULIN DETEMIR 100 UNIT/ML ~~LOC~~ SOLN: 26.0000 [IU] | Freq: Every day | SUBCUTANEOUS | 11 refills | Status: DC

## 2016-03-24 MED ORDER — SIMVASTATIN 40 MG PO TABS: 40.0000 mg | ORAL_TABLET | Freq: Every day | ORAL | 0 refills | Status: DC

## 2016-03-24 NOTE — Progress Notes (Signed)
Adult Psychoeducational Group Note  Date:  03/23/2016 Time:  9:30p.m.  Group Topic/Focus:  Wrap-Up Group:   The focus of this group is to help patients review their daily goal of treatment and discuss progress on daily workbooks.   Participation Level:  Active  Participation Quality:  Appropriate  Affect:  Anxious and Appropriate  Cognitive:  Appropriate  Insight: Appropriate  Engagement in Group:  Engaged  Modes of Intervention:  Discussion  Additional Comments:  Patient attended wrap-up group and stated the was anxious because he was to be discharge tomorrow.  He had made arrangements to go home to his mom.   His goal for today was to prepare for discharge and he accomplished it. Therefore, his day was a 2.  Lorenso Quirino W Jahnessa Vanduyn 03/23/2016, 9:30 p.m.

## 2016-03-24 NOTE — Tx Team (Signed)
Interdisciplinary Treatment Plan Update (Adult) Date: 03/24/2016   Date: 03/24/2016 12:05 PM  Progress in Treatment:  Attending groups:No Participating in groups: No Taking medication as prescribed: Yes  Tolerating medication: Yes  Family/Significant othe contact made: No, 2 unsuccessful contact attempts with mother Patient understands diagnosis: Yes AEB seeking help with depression Discussing patient identified problems/goals with staff: Yes  Medical problems stabilized or resolved: Yes  Denies suicidal/homicidal ideation: Yes Patient has not harmed self or Others: Yes   New problem(s) identified: None identified at this time.   Discharge Plan or Barriers: Pt requesting group home placement  03/21/16: CSW continuing to facilitate group home placement. Group home staff to visit on Monday  03/24/2016: Pt plans to discharge home with mother and follow-up with outpatient resources  Additional comments:  Patient and CSW reviewed pt's identified goals and treatment plan. Patient verbalized understanding and agreed to treatment plan.   Reason for Continuation of Hospitalization:  Depression Medication stabilization Suicidal ideation Hallucinations  Estimated length of stay: 0 days  Review of initial/current patient goals per problem list:   1.  Goal(s): Patient will participate in aftercare plan  Met:  Yes  Target date: 3-5 days from date of admission   As evidenced by: Patient will participate within aftercare plan AEB aftercare provider and housing plan at discharge being identified.  03/17/16: CSW to work with Pt to assess for appropriate discharge plan and faciliate appointments and referrals as needed prior to d/c. CSW to work on group home placement.  03/21/16: CSW continuing to facilitate group home placement. Group home staff to visit on Monday 03/24/2016: Pt plans to discharge home with mother and follow-up with outpatient resources  2.  Goal (s): Patient will exhibit  decreased depressive symptoms and suicidal ideations.  Met:  Adequate for DC  Target date: 3-5 days from date of admission   As evidenced by: Patient will utilize self rating of depression at 3 or below and demonstrate decreased signs of depression or be deemed stable for discharge by MD.  03/17/16: Pt rates depression at 8/10; endorses SI  03/21/16: Pt is reporting improvement in depression; denies SI  03/24/2016: MD feels that Pt's symptoms have decreased to the point that they can be managed in an outpatient setting.   5.  Goal(s): Patient will demonstrate decreased signs of psychosis  . Met:  Yes . Target date: 3-5 days from date of admission  . As evidenced by: Patient will demonstrate decreased frequency of AVH or return to baseline function    -03/17/16: Pt continues to report command hallucinations to kill himself    -03/21/16: Pt denies AVH  Attendees:  Patient:    Family:    Physician: Dr. Julieanne Manson, MD  03/24/2016 12:05 PM  Nursing: Mayra Neer, RN; Gaylan Gerold, RN 03/24/2016 12:05 PM  Clinical Social Worker Peri Maris, [place] 03/24/2016 12:05 PM  Other: 03/24/2016 12:05 PM  Clinical: Lars Pinks, RN Case manager  03/24/2016 12:05 PM  Other:  03/24/2016 12:05 PM  Other:     Peri Maris, [place] Work [phone]

## 2016-03-24 NOTE — Discharge Summary (Signed)
Physician Discharge Summary Note  Patient:  [name] is an 55 y.o., male MRN:  [id number] DOB:  March 15, 1961 Patient phone:  [phone] (home)  Patient address:   [address] [place] [address],  Total Time spent with patient: 30 minutes  Date of Admission:  03/13/2016 Date of Discharge: 03/24/2016  Reason for Admission:  Increased depression  Principal Problem: Schizoaffective disorder, bipolar type [facility]) Discharge Diagnoses: Patient Active Problem List   Diagnosis Date Noted  . DKA (diabetic ketoacidoses) (HCC) [E13.10] 02/28/2016  . Chest pain [R07.9] 02/28/2016  . AKI (acute kidney injury) (HCC) [N17.9] 02/28/2016  . Suicidal ideation [R45.851] 02/06/2016  . Cannabis use disorder, moderate, dependence (HCC) [F12.20] 02/06/2016  . Schizoaffective disorder, bipolar type (HCC) [F25.0] 02/05/2016  . Hyperlipidemia [E78.5] 01/18/2015  . Hyperprolactinemia (HCC) [E22.1] 01/18/2015  . Diabetes mellitus (HCC) [E11.9] 03/11/2012  . HTN (hypertension) [I10] 03/11/2012    Past Psychiatric History: see HPI  Past Medical History:  Past Medical History:  Diagnosis Date  . Anxiety   . Bipolar affective disorder (HCC)   . Bipolar disorder (HCC) 03/11/2012  . Depression   . Diabetes mellitus   . Diabetes mellitus (HCC) 03/11/2012  . Hepatitis 06/30/2013   Type C  . Hypertension   . Schizophrenia, schizo-affective (HCC)     Past Surgical History:  Procedure Laterality Date  . FINGER SURGERY    . WISDOM TOOTH EXTRACTION     Family History:  Family History  Problem Relation Age of Onset  . Diabetes Mother   . Bipolar disorder Sister    Family Psychiatric  History: see HPI Social History:  History  Alcohol Use No     History  Drug Use  . Types: Marijuana, Cocaine    Comment: 4 days ago last THC and cocoaine use... relapsed after 2 yrs     Social History   Social History  . Marital status: Single    Spouse name: N/A  . Number of children: N/A  . Years  of education: N/A   Social History Main Topics  . Smoking status: Never Smoker  . Smokeless tobacco: Never Used  . Alcohol use No  . Drug use:     Types: Marijuana, Cocaine     Comment: 4 days ago last THC and cocoaine use... relapsed after 2 yrs   . Sexual activity: Yes    Birth control/ protection: Condom     Comment: marijuana 25 days ago   Other Topics Concern  . None   Social History Narrative  . None   Hospital Course:  [name], 55 yo came in with long history of depression.  He had been non compliant with meds.  He had been reported to be suffering from PTSD./  He has had multiple admissions to the hospital.    [name] was admitted for Schizoaffective disorder, bipolar type [facility]) and crisis management.  He was treated with medications listed below and their indications.  Medical problems were identified and treated as needed.  Home medications were restarted as appropriate.  Improvement was monitored by observation and [name] daily report of symptom reduction.  Emotional and mental status was monitored by daily self inventory reports completed by [name] and clinical staff.  Patient reported continued improvement, denied any new concerns.  Patient had been compliant on medications and denied side effects.  Support and encouragement was provided.    At time of discharge, patient rated both depression and anxiety levels to be manageable and minimal.  Patient encouraged to attend groups to help with recognizing triggers of emotional crises and de-stabilizations.  Patient encouraged to attend group to help identify the positive things in life that would help in dealing with feelings of loss, depression and unhealthy or abusive tendencies.         [name] was evaluated by the treatment team for stability and plans for continued recovery upon discharge.  He was offered further treatment options upon discharge including Residential, Intensive Outpatient  and Outpatient treatment.  He will follow up with agencies listed below for medication management and counseling.  Encouraged patient to maintain satisfactory support network and home environment.  Advised to adhere to medication compliance and outpatient treatment follow up.  Prescriptions provided.       [name] motivation was an integral factor for scheduling further treatment.  Employment, transportation, bed availability, health status, family support, and any pending legal issues were also considered during his hospital stay.  Upon completion of this admission the patient was both mentally and medically stable for discharge denying suicidal/homicidal ideation, auditory/visual/tactile hallucinations, delusional thoughts and paranoia.      Physical Findings: AIMS: Facial and Oral Movements Muscles of Facial Expression: None, normal Lips and Perioral Area: None, normal Jaw: None, normal Tongue: None, normal,Extremity Movements Upper (arms, wrists, hands, fingers): None, normal Lower (legs, knees, ankles, toes): None, normal, Trunk Movements Neck, shoulders, hips: None, normal, Overall Severity Severity of abnormal movements (highest score from questions above): None, normal Incapacitation due to abnormal movements: None, normal Patient's awareness of abnormal movements (rate only patient's report): No Awareness, Dental Status Current problems with teeth and/or dentures?: No Does patient usually wear dentures?: No  CIWA:    COWS:     Musculoskeletal: Strength & Muscle Tone: within normal limits Gait & Station: normal Patient leans: N/A  Psychiatric Specialty Exam: Physical Exam  ROS  Blood pressure 121/81, pulse 72, temperature 98.2 F (36.8 C), temperature source Oral, resp. rate 16, height 5\' 8"  (1.727 m), weight 93 kg (205 lb), SpO2 100 %.Body mass index is 31.17 kg/m.    Have you used any form of tobacco in the last 30 days? (Cigarettes, Smokeless Tobacco, Cigars,  and/or Pipes): No Has this patient used any form of tobacco in the last 30 days? (Cigarettes, Smokeless Tobacco, Cigars, and/or Pipes) Yes, NA  Blood Alcohol level:  Lab Results  Component Value Date   ETH <5 03/12/2016   ETH <5 03/12/2016    Metabolic Disorder Labs:  Lab Results  Component Value Date   HGBA1C 14.2 (H) 03/18/2016   MPG 361 03/18/2016   MPG 275 08/03/2015   Lab Results  Component Value Date   PROLACTIN 21.5 (H) 03/18/2016   PROLACTIN 3.4 (L) 02/08/2016   Lab Results  Component Value Date   CHOL 193 03/18/2016   TRIG 102 03/18/2016   HDL 48 03/18/2016   CHOLHDL 4.0 03/18/2016   VLDL 20 03/18/2016   LDLCALC 125 (H) 03/18/2016   LDLCALC 146 (H) 02/08/2016    See Psychiatric Specialty Exam and Suicide Risk Assessment completed by Attending Physician prior to discharge.  Discharge destination:  Home  Is patient on multiple antipsychotic therapies at discharge:  No   Has Patient had three or more failed trials of antipsychotic monotherapy by history:  No  Recommended Plan for Multiple Antipsychotic Therapies: NA     Medication List    STOP taking these medications   acetaminophen 325 MG tablet Commonly known as:  TYLENOL   aspirin  EC 325 MG tablet   ibuprofen 200 MG tablet Commonly known as:  ADVIL,MOTRIN   insulin aspart 100 UNIT/ML injection Commonly known as:  novoLOG   Insulin Syringe-Needle U-100 30G 0.5 ML Misc   nitroGLYCERIN 0.4 MG SL tablet Commonly known as:  NITROSTAT     TAKE these medications     Indication  glipiZIDE 10 MG tablet Commonly known as:  GLUCOTROL Take 1 tablet (10 mg total) by mouth 2 (two) times daily before a meal.  Indication:  Type 2 Diabetes   hydrOXYzine 25 MG tablet Commonly known as:  ATARAX/VISTARIL Take 1 tablet (25 mg total) by mouth 3 (three) times daily as needed for anxiety.  Indication:  Anxiety Neurosis, Sedation   insulin detemir 100 UNIT/ML injection Commonly known as:  LEVEMIR Inject  0.25 mLs (25 Units total) into the skin daily. What changed:  how much to take  when to take this  Indication:  Type 2 Diabetes   insulin detemir 100 UNIT/ML injection Commonly known as:  LEVEMIR Inject 0.26 mLs (26 Units total) into the skin at bedtime. Schedule of insulin is prescribed as it was administered in the hospital.  And will be further managed by your outpatient provider. What changed:  You were already taking a medication with the same name, and this prescription was added. Make sure you understand how and when to take each.  Indication:  Type 2 Diabetes   metFORMIN 1000 MG tablet Commonly known as:  GLUCOPHAGE Take 1 tablet (1,000 mg total) by mouth 2 (two) times daily with a meal. What changed:  additional instructions  Indication:  Type 2 Diabetes   simvastatin 40 MG tablet Commonly known as:  ZOCOR Take 1 tablet (40 mg total) by mouth daily at 6 PM.  Indication:  Cerebrovascular Accident or Stroke, hyperlipidemia   venlafaxine XR 150 MG 24 hr capsule Commonly known as:  EFFEXOR-XR Take 1 capsule (150 mg total) by mouth daily with breakfast.  Indication:  Major Depressive Disorder   ziprasidone 40 MG capsule Commonly known as:  GEODON Take 1 capsule (40 mg total) by mouth 2 (two) times daily with a meal.  Indication:  Manic-Depression   zolpidem 10 MG tablet Commonly known as:  AMBIEN Take 1 tablet (10 mg total) by mouth at bedtime as needed for sleep. What changed:  medication strength  how much to take  Indication:  Trouble Sleeping      Follow-up Information    [facility] Follow up on 03/26/2016.   Why:  Hospital discharge follow up appointment on Weds 8/30 at 8:30 AM.  Bring discharge paperwork to this appointment.  Call to cancel/reschedule if needed.   Contact information: [address] [place], [place] [address] Phone: [phone] Fax:  [phone]          Follow-up recommendations:  Activity:  as tol Diet:  as tol  Comments:   1.  Take all your medications as prescribed.   2.  Report any adverse side effects to outpatient provider. 3.  Patient instructed to not use alcohol or illegal drugs while on prescription medicines. 4.  In the event of worsening symptoms, instructed patient to call 911, the crisis hotline or go to nearest emergency room for evaluation of symptoms.  Signed: Lindwood Qua, NP [facility] 03/24/2016, 4:00 PM

## 2016-03-24 NOTE — Progress Notes (Signed)
  [facility] :  Will you be returning to the same living situation after discharge:  No. will live w mother At discharge, do you have transportation home?: Yes,  train ticket provided Do you have the ability to pay for your medications: Yes,  has insurance, no concerns expressed  Release of information consent forms completed and in the chart;  Patient's signature needed at discharge.  Patient to Follow up at: Follow-up Information    [facility] Follow up on 03/26/2016.   Why:  Hospital discharge follow up appointment on Weds 8/30 at 8:30 AM.  Bring discharge paperwork to this appointment.  Call to cancel/reschedule if needed.   Contact information: [address], [place], [place] [address] Phone: [phone] Fax:  [phone]          Next level of care provider has [facility] Link:no  Safety Planning and Suicide Prevention discussed: Yes,  reviewed w patient in group, brochure provided  Have you used any form of tobacco in the last 30 days? (Cigarettes, Smokeless Tobacco, Cigars, and/or Pipes): No  Has patient been referred to the Quitline?: N/A patient is not a smoker  Patient has been referred for addiction treatment: Yes  Sallee Langenne C Cunningham 03/24/2016, 10:19 AM

## 2016-03-24 NOTE — Progress Notes (Signed)
Patient ID: Joseph LefortReginald Lipuma, male   DOB: 07/20/1961, 55 y.o.   MRN: [id number]  Pt. Denies SI/HI and A/V hallucinations. Belongings returned to patient at time of discharge. Patient denies any pain or discomfort. Discharge instructions and medications were reviewed with patient. Patient verbalized understanding of both medications and discharge instructions. [facility] [place]. Patient also received a train ticket as well. Q15 minute safety checks maintained until discharge. No distress upon discharge.

## 2016-03-24 NOTE — [facility] Group Notes (Signed)
[facility]   03/24/2016 1:08 PM  Participation Quality:  appropriate  Mood/Affect:  Appropriate  Thoughts of Suicide:  No Will you contract for safety?   NA  Current AVH:  No  Plan for Discharge/Comments:  [place] rather than admitted to ALF as planned, [facility] [place]  Transportation Means: was given train ticket   Supports: mother  Sallee Langenne C Cunningham

## 2016-03-24 NOTE — [facility] Suicide Risk Assessment (Signed)
[facility] INPATIENT:  Family/Significant Other Suicide Prevention Education  Suicide Prevention Education:  Contact Attempts: Francesca OmanElizabeth Andrews, mother, (name of family member/significant other) has been identified by the patient as the family member/significant other with whom the patient will be residing, and identified as the person(s) who will aid the patient in the event of a mental health crisis.  With written consent from the patient, two attempts were made to provide suicide prevention education, prior to and/or following the patient's discharge.  We were unsuccessful in providing suicide prevention education.  A suicide education pamphlet was given to the patient to share with family/significant other.  Date and time of first attempt: 8/28 at 10:30 AM Date and time of second attempt: 8/28 @ 12:05am  Sallee Langenne C Cunningham 03/24/2016, 10:31 AM

## 2016-03-24 NOTE — [facility] Suicide Risk Assessment (Signed)
[facility]   Principal Problem: Schizoaffective disorder, bipolar type [facility](HCC) Discharge Diagnoses:  Patient Active Problem List   Diagnosis Date Noted  . DKA (diabetic ketoacidoses) (HCC) [E13.10] 02/28/2016  . Chest pain [R07.9] 02/28/2016  . AKI (acute kidney injury) (HCC) [N17.9] 02/28/2016  . Suicidal ideation [R45.851] 02/06/2016  . Cannabis use disorder, moderate, dependence (HCC) [F12.20] 02/06/2016  . Schizoaffective disorder, bipolar type (HCC) [F25.0] 02/05/2016  . Hyperlipidemia [E78.5] 01/18/2015  . Hyperprolactinemia (HCC) [E22.1] 01/18/2015  . Diabetes mellitus (HCC) [E11.9] 03/11/2012  . HTN (hypertension) [I10] 03/11/2012    Total Time spent with patient: 30 minutes  Musculoskeletal: Strength & Muscle Tone: within normal limits Gait & Station: normal Patient leans: N/A  Psychiatric Specialty Exam: ROS  Blood pressure 121/81, pulse 72, temperature 98.2 F (36.8 C), temperature source Oral, resp. rate 16, height 5\' 8"  (1.727 m), weight 93 kg (205 lb), SpO2 100 %.Body mass index is 31.17 kg/m.  General Appearance: Casual  Eye Contact::  Good  Speech:  Clear and Coherent409  Volume:  Normal  Mood:  Euthymic  Affect:  Appropriate  Thought Process:  Coherent and Goal Directed  Orientation:  Full (Time, Place, and Person)  Thought Content:  Logical  Suicidal Thoughts:  No  Homicidal Thoughts:  No  Memory:  Immediate;   Good Recent;   Good Remote;   Good  Judgement:  Intact  Insight:  Good  Psychomotor Activity:  Normal  Concentration:  Good  Recall:  Good  Fund of Knowledge:Good  Language: Good  Akathisia:  No  Handed:  Right  AIMS (if indicated):     Assets:  Communication Skills. Desire for improvement, good insight, good social support, good source of income and a place to live  Sleep:  Number of Hours: 6.5  Cognition: WNL  ADL's:  Intact   Mental Status Per Nursing Assessment::   On Admission:  Suicidal ideation  indicated by patient, Thoughts of violence towards others  Demographic Factors:  Male and Divorced or widowed  Loss Factors: Decrease in vocational status  Historical Factors: Prior suicide attempts and Family history of mental illness or substance abuse  Risk Reduction Factors:   Responsible for children under 55 years of age, Sense of responsibility to family, Religious beliefs about death, Living with another person, especially a relative and Positive social support  Continued Clinical Symptoms:  None  Cognitive Features That Contribute To Risk:  None    Suicide Risk:  Minimal: No identifiable suicidal ideation.  Patients presenting with no risk factors but with morbid ruminations; may be classified as minimal risk based on the severity of the depressive symptoms    Plan Of Care/Follow-up recommendations:  Activity:  moderate exercise as tolerated  Follow up with outpatient treatment provider  Wynelle BourgeoisUreh N Lekauwa, MD 03/24/2016, 9:44 AM

## 2016-03-24 NOTE — Progress Notes (Signed)
D: Patient seen on day room watching TV. No interaction. Patient is for discharge tomorrow and he stated "I feel great going home tomorrow. I was suppose to go to group home but the lady did not show up. My mom advised me to come home". Denies pain, SI, AH/VH at this time. No behavioral issues noted.  A: Staff encouraged patient to continue with the treatment plan and verbalize needs to staff. Due meds given as ordered. Every 15 minutes check for safety maintained. Will continue to monitor patient for safety and stability.  R: Patient remains safe. Sleeping at this time.

## 2016-03-24 NOTE — Progress Notes (Signed)
Recreation Therapy Notes  Date: 03/24/16 Time: 0930 Location: [address] Dayroom  Group Topic: Stress Management  Goal Area(s) Addresses:  Patient will verbalize importance of using healthy stress management.  Patient will identify positive emotions associated with healthy stress management.   Intervention: Stress Management  Activity :  [place].  LRT introduced to the technique of guided imagery to the patients.  Patients were to follow along as LRT read script in order to participate in the the technique.  Education: Stress Management, Discharge Planning.   Education Outcome: Acknowledges edcuation/In group clarification offered/Needs additional education  Clinical Observations/Feedback: Pt did not attend group.    Vivian Okelley, LRT/CTRS    Rito Lecomte A 03/24/2016 12:56 PM 

## 2016-10-17 ENCOUNTER — Encounter (HOSPITAL_COMMUNITY): Payer: Self-pay | Admitting: Emergency Medicine

## 2016-10-17 ENCOUNTER — Emergency Department (HOSPITAL_COMMUNITY)
Admission: EM | Admit: 2016-10-17 | Discharge: 2016-10-19 | Disposition: A | Discharge status: Other Acute Inpatient Hospital | Payer: Medicare Other | Attending: Emergency Medicine | Admitting: Emergency Medicine

## 2016-10-17 DIAGNOSIS — Z79899 Other long term (current) drug therapy: Secondary | ICD-10-CM | POA: Insufficient documentation

## 2016-10-17 DIAGNOSIS — Z794 Long term (current) use of insulin: Secondary | ICD-10-CM | POA: Diagnosis not present

## 2016-10-17 DIAGNOSIS — I10 Essential (primary) hypertension: Secondary | ICD-10-CM | POA: Diagnosis not present

## 2016-10-17 DIAGNOSIS — F209 Schizophrenia, unspecified: Secondary | ICD-10-CM | POA: Insufficient documentation

## 2016-10-17 DIAGNOSIS — E119 Type 2 diabetes mellitus without complications: Secondary | ICD-10-CM | POA: Diagnosis not present

## 2016-10-17 DIAGNOSIS — R45851 Suicidal ideations: Secondary | ICD-10-CM

## 2016-10-17 LAB — SALICYLATE LEVEL: Salicylate Lvl: <7 mg/dL (ref 2.8–30.0)

## 2016-10-17 LAB — COMPREHENSIVE METABOLIC PANEL
ALK PHOS: 60 U/L (ref 38–126)
ALT: 15 U/L — AB (ref 17–63)
ANION GAP: 12 (ref 5–15)
AST: 31 U/L (ref 15–41)
Albumin: 3.4 g/dL — ABNORMAL LOW (ref 3.5–5.0)
BILIRUBIN TOTAL: 1.2 mg/dL (ref 0.3–1.2)
BUN: 13 mg/dL (ref 6–20)
CO2: 23 mmol/L (ref 22–32)
Calcium: 9.1 mg/dL (ref 8.9–10.3)
Chloride: 99 mmol/L — ABNORMAL LOW (ref 101–111)
Creatinine, Ser: 1.06 mg/dL (ref 0.61–1.24)
GFR calc non Af Amer: >60 mL/min (ref >60)
GLUCOSE: 289 mg/dL — AB (ref 65–99)
Potassium: 4.5 mmol/L (ref 3.5–5.1)
Sodium: 134 mmol/L — ABNORMAL LOW (ref 135–145)
Total Protein: 7.4 g/dL (ref 6.5–8.1)

## 2016-10-17 LAB — CBC
HEMATOCRIT: 37.6 % — AB (ref 39.0–52.0)
Hemoglobin: 12.1 g/dL — ABNORMAL LOW (ref 13.0–17.0)
MCH: 27.8 pg (ref 26.0–34.0)
MCHC: 32.2 g/dL (ref 30.0–36.0)
MCV: 86.4 fL (ref 78.0–100.0)
PLATELETS: 273 10*3/uL (ref 150–400)
RBC: 4.35 MIL/uL (ref 4.22–5.81)
RDW: 12.7 % (ref 11.5–15.5)
WBC: 5.5 10*3/uL (ref 4.0–10.5)

## 2016-10-17 LAB — RAPID URINE DRUG SCREEN, HOSP PERFORMED
AMPHETAMINES: NOT DETECTED
BENZODIAZEPINES: NOT DETECTED
Barbiturates: NOT DETECTED
COCAINE: NOT DETECTED
Opiates: NOT DETECTED
Tetrahydrocannabinol: POSITIVE — AB

## 2016-10-17 LAB — CBG MONITORING, ED: GLUCOSE-CAPILLARY: 280 mg/dL — AB (ref 65–99)

## 2016-10-17 LAB — ACETAMINOPHEN LEVEL

## 2016-10-17 LAB — ETHANOL

## 2016-10-17 MED ORDER — INSULIN DETEMIR 100 UNIT/ML ~~LOC~~ SOLN
26.0000 [IU] | Freq: Every day | SUBCUTANEOUS | Status: DC
  Administered 2016-10-17 – 2016-10-18 (×2): 26 [IU] via SUBCUTANEOUS
  Filled 2016-10-17 (×4): qty 0.26

## 2016-10-17 MED ORDER — VENLAFAXINE HCL ER 150 MG PO CP24
150.0000 mg | ORAL_CAPSULE | Freq: Every day | ORAL | Status: DC
  Administered 2016-10-18 – 2016-10-19 (×2): 150 mg via ORAL
  Filled 2016-10-17 (×2): qty 1

## 2016-10-17 MED ORDER — SIMVASTATIN 40 MG PO TABS
40.0000 mg | ORAL_TABLET | Freq: Every day | ORAL | Status: DC
  Administered 2016-10-17 – 2016-10-18 (×2): 40 mg via ORAL
  Filled 2016-10-17 (×4): qty 1

## 2016-10-17 MED ORDER — ZOLPIDEM TARTRATE 5 MG PO TABS: 10.0000 mg | ORAL_TABLET | Freq: Every evening | ORAL | Status: DC | PRN

## 2016-10-17 MED ORDER — METFORMIN HCL 500 MG PO TABS
1000.0000 mg | ORAL_TABLET | Freq: Two times a day (BID) | ORAL | Status: DC
  Administered 2016-10-17 – 2016-10-19 (×4): 1000 mg via ORAL
  Filled 2016-10-17 (×4): qty 2

## 2016-10-17 MED ORDER — GLIPIZIDE 10 MG PO TABS
10.0000 mg | ORAL_TABLET | Freq: Two times a day (BID) | ORAL | Status: DC
  Administered 2016-10-17 – 2016-10-19 (×4): 10 mg via ORAL
  Filled 2016-10-17 (×7): qty 1

## 2016-10-17 MED ORDER — ZIPRASIDONE HCL 20 MG PO CAPS
40.0000 mg | ORAL_CAPSULE | Freq: Two times a day (BID) | ORAL | Status: DC
  Administered 2016-10-17 – 2016-10-19 (×4): 40 mg via ORAL
  Filled 2016-10-17 (×3): qty 2
  Filled 2016-10-17: qty 1

## 2016-10-17 MED ORDER — INSULIN DETEMIR 100 UNIT/ML ~~LOC~~ SOLN: 25.0000 [IU] | Freq: Every day | SUBCUTANEOUS | Status: DC

## 2016-10-17 MED ORDER — HYDROXYZINE HCL 25 MG PO TABS: 25.0000 mg | ORAL_TABLET | Freq: Three times a day (TID) | ORAL | Status: DC | PRN
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
## 2016-10-17 NOTE — ED Notes (Signed)
Pt ate lunch.

## 2016-10-17 NOTE — ED Notes (Signed)
Pt.has scrubbs to put on.
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
## 2016-10-17 NOTE — ED Notes (Signed)
Sent message to pharmacy missing dose for medication Levimir

## 2016-10-17 NOTE — ED Notes (Signed)
Pt speaking to TTS 

## 2016-10-17 NOTE — ED Triage Notes (Signed)
Pt changing into maroon scrubs and belongings placed in bags. Security called to wand pt

## 2016-10-17 NOTE — ED Notes (Signed)
Waiting on a medication from pharmacy. (Glucotrol)

## 2016-10-18 DIAGNOSIS — F209 Schizophrenia, unspecified: Secondary | ICD-10-CM | POA: Diagnosis not present

## 2016-10-18 LAB — CBG MONITORING, ED: Glucose-Capillary: 255 mg/dL — ABNORMAL HIGH (ref 65–99)
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
## 2016-10-18 NOTE — Progress Notes (Addendum)
Referrals have been followed up and have been under review at the following inpatient facilities: [facility], [facility], [facility], [facility], [facility], [facility].  At capacity: [address], [facility], [facility], [facility], [facility], [facility], [facility], [facility].  CSW in disposition will continue to follow up with placement efforts.  Melbourne Abtsatia Darshay Deupree, LCSWA Disposition staff 10/18/2016 12:13 PM

## 2016-10-18 NOTE — ED Notes (Signed)
Verified w/pt it was the recent death of his brother rather than father has mentioned yesterday that has him depressed. States he has a place to live and has been taking his meds as directed. States last use of any illicit drugs was x 4 weeks ago - THC. Offered pt to shower - states he showered prior to arrival to ED yesterday.

## 2016-10-18 NOTE — ED Notes (Signed)
HS Snack given
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
## 2016-10-18 NOTE — ED Notes (Signed)
Pt ambulatory to shower w/Sitter. 

## 2016-10-19 DIAGNOSIS — F209 Schizophrenia, unspecified: Secondary | ICD-10-CM | POA: Diagnosis not present

## 2016-10-19 NOTE — ED Notes (Signed)
Pt talking, laughing w/Sitter. Denies AH/VH - does not appear to be responding to any internal stimuli - no self-injurious behaviors noted. Pt states continues to have SI/HI ideations and denies plan.

## 2016-10-19 NOTE — ED Notes (Signed)
Breakfast order placed ?

## 2016-10-19 NOTE — ED Notes (Signed)
Verified Magistrate received IVC paperwork.  

## 2016-10-19 NOTE — ED Notes (Signed)
Ambulatory to shower w/Sitter.  

## 2016-10-19 NOTE — ED Notes (Signed)
Pt advised he lives w/[place] mother [place]. States [place] been staying [place]/a friend and is able to return to his mother's house once he is d/c'd [facility] facility [facility] [place], aware.

## 2016-10-19 NOTE — ED Notes (Signed)
IVC papers faxed to magistrate - attempted to call to verify receipt - no answer.

## 2016-10-19 NOTE — ED Notes (Signed)
Pt aware and is in agreement w/tx plan - [facility]. Offered for pt to contact friend/family member - advised he will [facility].

## 2016-10-19 NOTE — ED Notes (Signed)
Re-TTS being performed.  

## 2016-10-19 NOTE — ED Provider Notes (Signed)
Patient here suicidal and now [facility] [place]. IVC paperwork filled out and patient notified   Lorre NickAnthony Pearse Shiffler, MD 10/19/16 1155
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
## 2016-10-19 NOTE — Progress Notes (Addendum)
Referrals have been followed up and have been under review at the following inpatient facilities: [facility] per Belenda CruiseKristin, refax. Referral refaxed today. [facility] - per Donzetta SprungAmbica, doc will let you know if accepted. [facility] - per Joni ReiningNicole, referral is good to be reviewed with attending doc. [facility] - per [place]onte, not found. Referral refaxed today. [facility] - left voicemail requesting call back.   [facility], [facility], [address] [facility], [facility], [facility], [facility], [facility].  CSW in disposition will continue to follow up with placement efforts.  Melbourne Abtsatia Francisca Langenderfer, LCSWA Disposition staff 10/19/2016 10:43 AM

## 2016-10-19 NOTE — Progress Notes (Signed)
[name] requested that patient be IVC'd for [facility], accepting provider is Dr. Cherylann RatelBrian Vasquez. [name] at [facility] informed that [facility] been [facility]. Please [facility] at [phone]. Once [facility] and transportation is ready call report at [phone]. [facility]-ED RN [name] has been informed.  [name], LCSWA Disposition staff 10/19/2016 11:37 AM

## 2016-10-19 NOTE — ED Notes (Signed)
Per [place], [facility] Joni Reining- Nicole, RN, [facility], advised will accept pt but requires pt to be IVC'd. Accepting Dr - Dr Otelia SanteeFarrah. Report call [phone] when pt is en route.

## 2016-10-29 ENCOUNTER — Encounter (HOSPITAL_COMMUNITY): Payer: Self-pay

## 2016-10-29 ENCOUNTER — Emergency Department (HOSPITAL_COMMUNITY)
Admission: EM | Admit: 2016-10-29 | Discharge: 2016-10-30 | Disposition: A | Discharge status: Home / Self Care | Payer: Medicare Other | Attending: Emergency Medicine | Admitting: Emergency Medicine

## 2016-10-29 DIAGNOSIS — Z79899 Other long term (current) drug therapy: Secondary | ICD-10-CM | POA: Diagnosis not present

## 2016-10-29 DIAGNOSIS — E119 Type 2 diabetes mellitus without complications: Secondary | ICD-10-CM | POA: Diagnosis not present

## 2016-10-29 DIAGNOSIS — Z794 Long term (current) use of insulin: Secondary | ICD-10-CM | POA: Insufficient documentation

## 2016-10-29 DIAGNOSIS — F329 Major depressive disorder, single episode, unspecified: Secondary | ICD-10-CM | POA: Diagnosis not present

## 2016-10-29 DIAGNOSIS — F250 Schizoaffective disorder, bipolar type: Secondary | ICD-10-CM | POA: Diagnosis not present

## 2016-10-29 DIAGNOSIS — R45851 Suicidal ideations: Secondary | ICD-10-CM | POA: Diagnosis present

## 2016-10-29 DIAGNOSIS — F1420 Cocaine dependence, uncomplicated: Secondary | ICD-10-CM

## 2016-10-29 DIAGNOSIS — I10 Essential (primary) hypertension: Secondary | ICD-10-CM | POA: Diagnosis not present

## 2016-10-29 DIAGNOSIS — Z818 Family history of other mental and behavioral disorders: Secondary | ICD-10-CM | POA: Diagnosis not present

## 2016-10-29 LAB — COMPREHENSIVE METABOLIC PANEL
ALT: 17 U/L (ref 17–63)
AST: 24 U/L (ref 15–41)
Albumin: 4.4 g/dL (ref 3.5–5.0)
Alkaline Phosphatase: 74 U/L (ref 38–126)
Anion gap: 12 (ref 5–15)
BILIRUBIN TOTAL: 2 mg/dL — AB (ref 0.3–1.2)
BUN: 25 mg/dL — AB (ref 6–20)
CO2: 21 mmol/L — ABNORMAL LOW (ref 22–32)
Calcium: 9.6 mg/dL (ref 8.9–10.3)
Chloride: 99 mmol/L — ABNORMAL LOW (ref 101–111)
Creatinine, Ser: 1.47 mg/dL — ABNORMAL HIGH (ref 0.61–1.24)
GFR, EST AFRICAN AMERICAN: 60 mL/min — AB (ref >60)
GFR, EST NON AFRICAN AMERICAN: 52 mL/min — AB (ref >60)
Glucose, Bld: 424 mg/dL — ABNORMAL HIGH (ref 65–99)
POTASSIUM: 4.1 mmol/L (ref 3.5–5.1)
Sodium: 132 mmol/L — ABNORMAL LOW (ref 135–145)
TOTAL PROTEIN: 8.4 g/dL — AB (ref 6.5–8.1)

## 2016-10-29 LAB — CBG MONITORING, ED
GLUCOSE-CAPILLARY: 239 mg/dL — AB (ref 65–99)
Glucose-Capillary: 317 mg/dL — ABNORMAL HIGH (ref 65–99)
Glucose-Capillary: 160 mg/dL — ABNORMAL HIGH (ref 65–99)
Glucose-Capillary: 189 mg/dL — ABNORMAL HIGH (ref 65–99)

## 2016-10-29 LAB — RAPID URINE DRUG SCREEN, HOSP PERFORMED
Amphetamines: NOT DETECTED
BENZODIAZEPINES: NOT DETECTED
Barbiturates: NOT DETECTED
COCAINE: POSITIVE — AB
OPIATES: NOT DETECTED
Tetrahydrocannabinol: NOT DETECTED

## 2016-10-29 LAB — CBC
HCT: 36.2 % — ABNORMAL LOW (ref 39.0–52.0)
Hemoglobin: 12.7 g/dL — ABNORMAL LOW (ref 13.0–17.0)
MCH: 29.2 pg (ref 26.0–34.0)
MCHC: 35.1 g/dL (ref 30.0–36.0)
MCV: 83.2 fL (ref 78.0–100.0)
Platelets: 244 10*3/uL (ref 150–400)
RBC: 4.35 MIL/uL (ref 4.22–5.81)
RDW: 12.5 % (ref 11.5–15.5)
WBC: 10.9 10*3/uL — AB (ref 4.0–10.5)

## 2016-10-29 LAB — SALICYLATE LEVEL

## 2016-10-29 LAB — ETHANOL: Alcohol, Ethyl (B): <5 mg/dL (ref <5)

## 2016-10-29 LAB — ACETAMINOPHEN LEVEL

## 2016-10-29 MED ORDER — INSULIN DETEMIR 100 UNIT/ML ~~LOC~~ SOLN
50.0000 [IU] | Freq: Every day | SUBCUTANEOUS | Status: DC
  Administered 2016-10-29: 50 [IU] via SUBCUTANEOUS
  Filled 2016-10-29: qty 0.5

## 2016-10-29 MED ORDER — METFORMIN HCL 500 MG PO TABS
1000.0000 mg | ORAL_TABLET | Freq: Two times a day (BID) | ORAL | Status: DC
  Administered 2016-10-29 – 2016-10-30 (×3): 1000 mg via ORAL
  Filled 2016-10-29 (×3): qty 2

## 2016-10-29 MED ORDER — GLIPIZIDE 10 MG PO TABS
10.0000 mg | ORAL_TABLET | Freq: Two times a day (BID) | ORAL | Status: DC
  Administered 2016-10-29 – 2016-10-30 (×3): 10 mg via ORAL
  Filled 2016-10-29 (×3): qty 1

## 2016-10-29 MED ORDER — ARIPIPRAZOLE 5 MG PO TABS
15.0000 mg | ORAL_TABLET | Freq: Every day | ORAL | Status: DC
  Administered 2016-10-29 – 2016-10-30 (×2): 15 mg via ORAL
  Filled 2016-10-29 (×2): qty 1

## 2016-10-29 MED ORDER — FLUOXETINE HCL 20 MG PO CAPS
40.0000 mg | ORAL_CAPSULE | Freq: Every day | ORAL | Status: DC
  Administered 2016-10-29 – 2016-10-30 (×2): 40 mg via ORAL
  Filled 2016-10-29 (×2): qty 2

## 2016-10-29 MED ORDER — QUETIAPINE FUMARATE 100 MG PO TABS
200.0000 mg | ORAL_TABLET | Freq: Every day | ORAL | Status: DC
  Administered 2016-10-29: 200 mg via ORAL
  Filled 2016-10-29: qty 2

## 2016-10-29 MED ORDER — ALUM & MAG HYDROXIDE-SIMETH 200-200-20 MG/5ML PO SUSP: 30.0000 mL | ORAL | Status: DC | PRN

## 2016-10-29 MED ORDER — ONDANSETRON HCL 4 MG PO TABS: 4.0000 mg | ORAL_TABLET | Freq: Three times a day (TID) | ORAL | Status: DC | PRN

## 2016-10-29 MED ORDER — ACETAMINOPHEN 325 MG PO TABS
650.0000 mg | ORAL_TABLET | ORAL | Status: DC | PRN
  Administered 2016-10-30: 650 mg via ORAL
  Filled 2016-10-29: qty 2

## 2016-10-29 MED ORDER — ZOLPIDEM TARTRATE 10 MG PO TABS: 10.0000 mg | ORAL_TABLET | Freq: Every evening | ORAL | Status: DC | PRN

## 2016-10-29 MED ORDER — INSULIN ASPART 100 UNIT/ML ~~LOC~~ SOLN
0.0000 [IU] | Freq: Three times a day (TID) | SUBCUTANEOUS | Status: DC
  Administered 2016-10-29: 3 [IU] via SUBCUTANEOUS
  Administered 2016-10-29: 11 [IU] via SUBCUTANEOUS
  Administered 2016-10-29: 5 [IU] via SUBCUTANEOUS
  Administered 2016-10-30: 8 [IU] via SUBCUTANEOUS
  Filled 2016-10-29 (×4): qty 1

## 2016-10-29 MED ORDER — HYDROXYZINE HCL 25 MG PO TABS: 25.0000 mg | ORAL_TABLET | Freq: Three times a day (TID) | ORAL | Status: DC | PRN

## 2016-10-29 MED ORDER — IBUPROFEN 200 MG PO TABS: 600.0000 mg | ORAL_TABLET | Freq: Three times a day (TID) | ORAL | Status: DC | PRN

## 2016-10-29 NOTE — BH Assessment (Signed)
Tele Assessment Note   [name] is an 56 y.o. male, African American, Single who presents to [facility] ED per ED report: PMHx of bipolar disorder and schizophrenia, who presents to the Emergency Department complaining of suicidal ideation with a plan of walking out into traffic x yesterday. Pt states he has recently been depressed due to the death of his father. He also reports a hx of suicidal ideations with plans of overdosing or shooting himself. Pt is compliant with his medications but notes they are not providing relief. Patient states primary concern is current depression and SI with pal jump in front of traffic. Patient states that he currently resides with mother. Patient states that father died x 4 weeks ago. Patient states that he has had change in sleep/decrease with 4 hours or less per night. Patient acknowledges current SI with plan jump in front of traffic. Patient denies HI and AVH, but states has trouble hearing out of one ear. Patient acknowledges hx. Of S.A. With cocaine last use 10/26/16 "A little bit". Patient has hx. Of inpatient care multiple with last in March 2018 [facility] for SI and schizophrenia. Patient multiple inpatient for psych care was with [facility] and [facility] in [facility]. Patient states that he is currently seen outpatient via [facility] and that is ongoing. Patient is dressed in scrubs and is alert and oriented x4. Patient speech was within normal limits and motor behavior appeared normal. Patient thought process is coherent. Patient  does not appear to be responding to internal stimuli. Patient was cooperative throughout the assessment and states that he is agreeable to inpatient psychiatric treatment.   Diagnosis: Bipolar Disorder  Past Medical History:  Past Medical History:  Diagnosis Date  . Anxiety   . Bipolar affective disorder (HCC)   . Bipolar disorder (HCC) 03/11/2012  . Depression   . Diabetes mellitus   . Diabetes mellitus (HCC) 03/11/2012  . Hepatitis  06/30/2013   Type C  . Hypertension   . Schizophrenia, schizo-affective (HCC)     Past Surgical History:  Procedure Laterality Date  . FINGER SURGERY    . WISDOM TOOTH EXTRACTION      Family History:  Family History  Problem Relation Age of Onset  . Diabetes Mother   . Bipolar disorder Sister     Social History:  reports that he has never smoked. He has never used smokeless tobacco. He reports that he uses drugs, including Marijuana and Cocaine. He reports that he does not drink alcohol.  Additional Social History:  Alcohol / Drug Use Pain Medications: SEE MAR Prescriptions: SEE MAR Over the Counter: SEE MAR History of alcohol / drug use?: Yes Longest period of sobriety (when/how long): not specified Negative Consequences of Use: Financial, Legal, Personal relationships, Work / School Withdrawal Symptoms: Patient aware of relationship between substance abuse and physical/medical complications Substance #1 Name of Substance 1: Cocaine 1 - Age of First Use: 18 1 - Amount (size/oz): "A little bit" 1 - Frequency: once per month 1 - Duration: years 1 - Last Use / Amount: 10/26/16 "A little bit"  CIWA: CIWA-Ar BP: (!) 95/51 Pulse Rate: 87 COWS:    PATIENT STRENGTHS: (choose at least two) Active sense of humor Average or above average intelligence Communication skills  Allergies:  Allergies  Allergen Reactions  . Wellbutrin [Bupropion] Hives and Swelling    Home Medications:  (Not in a hospital admission)  OB/GYN Status:  No LMP for male patient.  General Assessment Data Location of  Assessment: [facility] TTS Assessment: In system Is this a Tele or Face-to-Face Assessment?: Face-to-Face Is this an Initial Assessment or a Re-assessment for this encounter?: Initial Assessment Marital status: Single Maiden name: n/a Is patient pregnant?: No Pregnancy Status: No Living Arrangements: Parent Can pt return to current living arrangement?: Yes Admission Status:  Voluntary Is patient capable of signing voluntary admission?: Yes Referral Source: Self/Family/Friend Insurance type: Medicare     Crisis Care Plan Living Arrangements: Parent [facility]rist[facility] of [facility]: [facility]  Education Status Is patient currently in school?: No Current Grade: n/a Highest grade of school patient has completed: 12th Name of school: n/a Contact person: none given  Risk to self with the past 6 months Suicidal Ideation: Yes-Currently Present Has patient been a risk to self within the past 6 months prior to admission? : Yes Suicidal Intent: Yes-Currently Present Has patient had any suicidal intent within the past 6 months prior to admission? : Yes Is patient at risk for suicide?: Yes Suicidal Plan?: Yes-Currently Present Has patient had any suicidal plan within the past 6 months prior to admission? : Yes Specify Current Suicidal Plan: walk in front traffic Access to Means: Yes Specify Access to Suicidal Means: access to traffic What has been your use of drugs/alcohol within the last 12 months?: cocaine Previous Attempts/Gestures: Yes How many times?: 1 Other Self Harm Risks: past cutter Triggers for Past Attempts: Unknown Intentional Self Injurious Behavior: None Family Suicide History: No Recent stressful life event(s): Loss (Comment) Persecutory voices/beliefs?: No Depression: Yes Depression Symptoms: Despondent, Insomnia, Tearfulness, Isolating, Fatigue, Guilt, Loss of interest in usual pleasures, Feeling worthless/self pity Substance abuse history and/or treatment for substance abuse?: Yes Suicide prevention information given to non-admitted patients: Yes  Risk to Others within the past 6 months Homicidal Ideation: No Does patient have any lifetime risk of violence toward others beyond the six months prior to admission? : No Thoughts of Harm to Others: No Comment - Thoughts of Harm to Others: no Current Homicidal Intent:  No Current Homicidal Plan: No Access to Homicidal Means: No Describe Access to Homicidal Means: n/a Identified Victim: none History of harm to others?: Yes Assessment of Violence: In distant past Violent Behavior Description: none Does patient have access to weapons?: No Criminal Charges Pending?: No Does patient have a court date: No Is patient on probation?: No  Psychosis Hallucinations: None noted Delusions: None noted  Mental Status Report Appearance/Hygiene: In scrubs Eye Contact: Fair Motor Activity: Freedom of movement Speech: Logical/coherent Level of Consciousness: Drowsy Mood: Depressed Affect: Depressed Anxiety Level: Panic Attacks Panic attack frequency: off and on Most recent panic attack: 10/28/16 Thought Processes: Coherent Judgement: Unimpaired Orientation: Person, Place, Time, Situation, Appropriate for developmental age Obsessive Compulsive Thoughts/Behaviors: Moderate  Cognitive Functioning Concentration: Normal Memory: Recent Intact, Remote Intact IQ: Average Insight: Fair Impulse Control: Fair Appetite: Fair Weight Loss: 0 Weight Gain: 0 Sleep: Decreased Total Hours of Sleep: 4 Vegetative Symptoms: None  ADLScreening [facility] Assessment Services) Patient's cognitive ability adequate to safely complete daily activities?: Yes Patient able to express need for assistance with ADLs?: Yes Independently performs ADLs?: Yes (appropriate for developmental age)  Prior Inpatient Therapy Prior Inpatient Therapy: Yes Prior Therapy Dates: multiple Prior Therapy Facilty/Provider(s): [facility], [facility] Reason for Treatment: SI, psychosis  Prior Outpatient Therapy Prior Outpatient Therapy: Yes Prior Therapy Dates: current Prior Therapy Facilty/Provider(s): [facility] Reason for Treatment: schizophrenia Does patient have an ACCT team?: Unknown Does patient have Intensive In-House Services?  : No Does patient  have [facility] services? : No Does patient have P4CC  services?: No  ADL Screening (condition at time of admission) Patient's cognitive ability adequate to safely complete daily activities?: Yes Is the patient deaf or have difficulty hearing?: Yes (hearing in right ear problematic) Does the patient have difficulty seeing, even when wearing glasses/contacts?: No Does the patient have difficulty concentrating, remembering, or making decisions?: No Patient able to express need for assistance with ADLs?: Yes Does the patient have difficulty dressing or bathing?: No Independently performs ADLs?: Yes (appropriate for developmental age) Does the patient have difficulty walking or climbing stairs?: No Weakness of Legs: None Weakness of Arms/Hands: None       Abuse/Neglect Assessment (Assessment to be complete while patient is alone) Physical Abuse: Denies Verbal Abuse: Denies Sexual Abuse: Denies Exploitation of patient/patient's resources: Denies Self-Neglect: Denies Values / Beliefs Cultural Requests During Hospitalization: None Spiritual Requests During Hospitalization: None   Advance Directives (For Healthcare) Does Patient Have a Medical Advance Directive?: No Would patient like information on creating a medical advance directive?: No - Patient declined    Additional Information 1:1 In Past 12 Months?: No CIRT Risk: No Elopement Risk: No Does patient have medical clearance?: Yes     Disposition: Per Donell Sievert PA meets inpatient criteria Disposition Initial Assessment Completed for this Encounter: Yes Disposition of Patient: Other dispositions (TBD)  Bertrand Vowels K Kayson Tasker 10/29/2016 6:30 AM

## 2016-10-29 NOTE — ED Provider Notes (Signed)
[facility]-EMER[facility]ENCY DEPT Provider Note   CSN: [id number] Arrival date & time: 10/29/16  0227   By signing my name below, I, [name], attest that this documentation has been prepared under the direction and in the presence of Joseph Crease, MD  Electronically Signed: Clovis Vasquez, ED Scribe. 10/29/16. 4:06 AM.   History   Chief Complaint Chief Complaint  Patient presents with  . Suicidal    HPI Comments:  [name] is a 56 y.o. male, with a PMHx of bipolar disorder and schizophrenia, who presents to the Emergency Department complaining of suicidal ideation with a plan of walking out into traffic x yesterday. Pt states he has recently been depressed due to the death of his father. He also reports a hx of suicidal ideations with plans of overdosing or shooting himself. Pt is compliant with his medications but notes they are not providing relief. Pt denies any other associated symptoms. No other complaints noted.     The history is provided by the patient. No language interpreter was used.    Past Medical History:  Diagnosis Date  . Anxiety   . Bipolar affective disorder (HCC)   . Bipolar disorder (HCC) 03/11/2012  . Depression   . Diabetes mellitus   . Diabetes mellitus (HCC) 03/11/2012  . Hepatitis 06/30/2013   Type C  . Hypertension   . Schizophrenia, schizo-affective [facility])     Patient Active Problem List   Diagnosis Date Noted  . DKA (diabetic ketoacidoses) (HCC) 02/28/2016  . Chest pain 02/28/2016  . AKI (acute kidney injury) (HCC) 02/28/2016  . Suicidal ideation 02/06/2016  . Cannabis use disorder, moderate, dependence (HCC) 02/06/2016  . Schizoaffective disorder, bipolar type (HCC) 02/05/2016  . Hyperlipidemia 01/18/2015  . Hyperprolactinemia (HCC) 01/18/2015  . Diabetes mellitus (HCC) 03/11/2012  . HTN (hypertension) 03/11/2012    Past Surgical History:  Procedure Laterality Date  . FINGER SURGERY    . WISDOM TOOTH EXTRACTION         Home  Medications    Prior to Admission medications   Medication Sig Start Date End Date Taking? Authorizing Provider  ARIPiprazole (ABILIFY) 15 MG tablet Take 15 mg by mouth daily.   Yes Historical Provider, MD  FLUoxetine (PROZAC) 40 MG capsule Take 40 mg by mouth daily.   Yes Historical Provider, MD  glipiZIDE (GLUCOTROL) 10 MG tablet Take 1 tablet (10 mg total) by mouth 2 (two) times daily before a meal. 03/24/16  Yes Adonis Brook, NP  HUMALOG KWIKPEN 100 UNIT/ML KiwkPen Inject 0-10 Units into the skin 3 (three) times daily with meals. 09/11/16  Yes Historical Provider, MD  hydrOXYzine (ATARAX/VISTARIL) 25 MG tablet Take 1 tablet (25 mg total) by mouth 3 (three) times daily as needed for anxiety. 03/24/16  Yes Adonis Brook, NP  insulin detemir (LEVEMIR) 100 UNIT/ML injection Inject 0.26 mLs (26 Units total) into the skin at bedtime. Schedule of insulin is prescribed as it was administered in the hospital.  And will be further managed by your outpatient provider. Patient taking differently: Inject 50 Units into the skin at bedtime. Schedule of insulin is prescribed as it was administered in the hospital.  And will be further managed by your outpatient provider. 03/24/16  Yes Adonis Brook, NP  metFORMIN (GLUCOPHAGE) 1000 MG tablet Take 1 tablet (1,000 mg total) by mouth 2 (two) times daily with a meal. 03/24/16  Yes Adonis Brook, NP  QUEtiapine (SEROQUEL) 200 MG tablet Take 200 mg by mouth at bedtime. 10/22/16 11/21/16 Yes Historical  Provider, MD  zolpidem (AMBIEN) 5 MG tablet Take 5 mg by mouth at bedtime as needed for sleep. 07/14/16  Yes Historical Provider, MD  zolpidem (AMBIEN) 10 MG tablet Take 1 tablet (10 mg total) by mouth at bedtime as needed for sleep. 03/24/16 04/23/16  Adonis Brook, NP    Family History Family History  Problem Relation Age of Onset  . Diabetes Mother   . Bipolar disorder Sister     Social History Social History  Substance Use Topics  . Smoking status: Never  Smoker  . Smokeless tobacco: Never Used  . Alcohol use No     Allergies   Wellbutrin [bupropion]   Review of Systems Review of Systems  Constitutional: Negative for fever.  Psychiatric/Behavioral: Positive for suicidal ideas.  All other systems reviewed and are negative.    Physical Exam Updated Vital Signs BP (!) 95/51 (BP Location: Left Arm)   Pulse 87   Temp 98.7 F (37.1 C) (Oral)   Resp 20   SpO2 98%   Physical Exam  Constitutional: He is oriented to person, place, and time. He appears well-developed and well-nourished. No distress.  HENT:  Head: Normocephalic and atraumatic.  Right Ear: Hearing normal.  Left Ear: Hearing normal.  Nose: Nose normal.  Mouth/Throat: Oropharynx is clear and moist and mucous membranes are normal.  Eyes: Conjunctivae and EOM are normal. Pupils are equal, round, and reactive to light.  Neck: Normal range of motion. Neck supple.  Cardiovascular: Regular rhythm, S1 normal and S2 normal.  Exam reveals no gallop and no friction rub.   No murmur heard. Pulmonary/Chest: Effort normal and breath sounds normal. No respiratory distress. He exhibits no tenderness.  Abdominal: Soft. Normal appearance and bowel sounds are normal. There is no hepatosplenomegaly. There is no tenderness. There is no rebound, no guarding, no tenderness at McBurney's point and negative Murphy's sign. No hernia.  Musculoskeletal: Normal range of motion.  Neurological: He is alert and oriented to person, place, and time. He has normal strength. No cranial nerve deficit or sensory deficit. Coordination normal. GCS eye subscore is 4. GCS verbal subscore is 5. GCS motor subscore is 6.  Skin: Skin is warm, dry and intact. No rash noted. No cyanosis.  Psychiatric: His speech is normal and behavior is normal. He exhibits a depressed mood. He expresses suicidal ideation. He expresses suicidal plans.  Nursing note and vitals reviewed.    ED Treatments / Results  DIAGNOSTIC  STUDIES:  Oxygen Saturation is 100% on RA, normal by my interpretation.    COORDINATION OF CARE:  3:56 AM Discussed treatment plan with pt at bedside and pt agreed to plan.  Labs (all labs ordered are listed, but only abnormal results are displayed) Labs Reviewed  COMPREHENSIVE METABOLIC PANEL - Abnormal; Notable for the following:       Result Value   Sodium 132 (*)    Chloride 99 (*)    CO2 21 (*)    Glucose, Bld 424 (*)    BUN 25 (*)    Creatinine, Ser 1.47 (*)    Total Protein 8.4 (*)    Total Bilirubin 2.0 (*)    GFR calc non Af Amer 52 (*)    GFR calc Af Amer 60 (*)    All other components within normal limits  ACETAMINOPHEN LEVEL - Abnormal; Notable for the following:    Acetaminophen (Tylenol), Serum <10 (*)    All other components within normal limits  CBC - Abnormal; Notable for the  following:    WBC 10.9 (*)    Hemoglobin 12.7 (*)    HCT 36.2 (*)    All other components within normal limits  RAPID URINE DRUG SCREEN, HOSP PERFORMED - Abnormal; Notable for the following:    Cocaine POSITIVE (*)    All other components within normal limits  ETHANOL  SALICYLATE LEVEL    EKG  EKG Interpretation None       Radiology No results found.  Procedures Procedures (including critical care time)  Medications Ordered in ED Medications - No data to display   Initial Impression / Assessment and Plan / ED Course  I have reviewed the triage vital signs and the nursing notes.  Pertinent labs & imaging results that were available during my care of the patient were reviewed by me and considered in my medical decision making (see chart for details).     Patient presents to the emergency department with complaints of depression. Patient reports that he has been having difficulty after the death of his father 4 weeks ago. He is thinking about killing himself. He does have a history of multiple suicide attempts. Patient's current plan is to walk out into traffic.  Patient will require psychiatric evaluation.  Final Clinical Impressions(s) / ED Diagnoses   Final diagnoses:  Depression, unspecified depression type  Suicidal ideation    New Prescriptions New Prescriptions   No medications on file  I personally performed the services described in this documentation, which was scribed in my presence. The recorded information has been reviewed and is accurate.     Joseph Crease, MD 10/29/16 [phone]

## 2016-10-29 NOTE — ED Notes (Signed)
Up to the bathroom 

## 2016-10-29 NOTE — Progress Notes (Signed)
Per Karleen Hampshire, [place] meets inpatient criteria [place] K. Sherlon Handing, LPC-A, [place]  Counselor 10/29/2016 6:40 AM

## 2016-10-29 NOTE — ED Notes (Signed)
Per [employer]- Pt has been having suicidal thoughts since since yesterday morning. He is upset that his father died in a car accident four weeks ago.

## 2016-10-29 NOTE — ED Notes (Signed)
Bed: [facility]36 Expected date:  Expected time:  Means of arrival:  Comments: Room 30 

## 2016-10-29 NOTE — ED Notes (Signed)
Pt resting at present, no distress noted, calm & cooperative.  Monitoring for safety, Q 15 min checks in effect. 

## 2016-10-29 NOTE — ED Notes (Signed)
Gave report to sappu

## 2016-10-30 DIAGNOSIS — Z818 Family history of other mental and behavioral disorders: Secondary | ICD-10-CM

## 2016-10-30 DIAGNOSIS — F250 Schizoaffective disorder, bipolar type: Secondary | ICD-10-CM | POA: Diagnosis not present

## 2016-10-30 DIAGNOSIS — Z79899 Other long term (current) drug therapy: Secondary | ICD-10-CM | POA: Diagnosis not present

## 2016-10-30 DIAGNOSIS — F1420 Cocaine dependence, uncomplicated: Secondary | ICD-10-CM | POA: Diagnosis not present

## 2016-10-30 LAB — CBG MONITORING, ED: GLUCOSE-CAPILLARY: 253 mg/dL — AB (ref 65–99)

## 2016-10-30 MED ORDER — GLIPIZIDE 10 MG PO TABS: 10.0000 mg | ORAL_TABLET | Freq: Two times a day (BID) | ORAL | 0 refills | Status: DC

## 2016-10-30 MED ORDER — QUETIAPINE FUMARATE 200 MG PO TABS: 200.0000 mg | ORAL_TABLET | Freq: Every day | ORAL | 0 refills | Status: DC

## 2016-10-30 MED ORDER — HYDROXYZINE HCL 25 MG PO TABS: 25.0000 mg | ORAL_TABLET | Freq: Three times a day (TID) | ORAL | 0 refills | Status: DC | PRN

## 2016-10-30 MED ORDER — FLUOXETINE HCL 40 MG PO CAPS: 40.0000 mg | ORAL_CAPSULE | Freq: Every day | ORAL | 0 refills | Status: DC

## 2016-10-30 MED ORDER — ARIPIPRAZOLE 15 MG PO TABS: 15.0000 mg | ORAL_TABLET | Freq: Every day | ORAL | 0 refills | Status: DC

## 2016-10-30 NOTE — [facility] Suicide Risk Assessment (Signed)
Suicide Risk Assessment  Discharge Assessment   [facility] Discharge Suicide Risk Assessment   Principal Problem: Schizoaffective disorder, bipolar type [facility]) Discharge Diagnoses:  Patient Active Problem List   Diagnosis Date Noted  . Schizoaffective disorder, bipolar type (HCC) [F25.0] 02/05/2016    Priority: High  . Cocaine use disorder, moderate, dependence (HCC) [F14.20] 10/29/2016  . DKA (diabetic ketoacidoses) (HCC) [E13.10] 02/28/2016  . Chest pain [R07.9] 02/28/2016  . AKI (acute kidney injury) (HCC) [N17.9] 02/28/2016  . Suicidal ideation [R45.851] 02/06/2016  . Cannabis use disorder, moderate, dependence (HCC) [F12.20] 02/06/2016  . Hyperlipidemia [E78.5] 01/18/2015  . Hyperprolactinemia (HCC) [E22.1] 01/18/2015  . Diabetes mellitus (HCC) [E11.9] 03/11/2012  . HTN (hypertension) [I10] 03/11/2012    Total Time spent with patient: 45 minutes Musculoskeletal: Strength & Muscle Tone: within normal limits Gait & Station: normal Patient leans: N/A  Psychiatric Specialty Exam: Physical Exam  Constitutional: He is oriented to person, place, and time. He appears well-developed and well-nourished.  HENT:  Head: Normocephalic.  Neck: Normal range of motion.  Respiratory: Effort normal.  Musculoskeletal: Normal range of motion.  Neurological: He is alert and oriented to person, place, and time.  Psychiatric: His speech is normal and behavior is normal. Judgment and thought content normal. Cognition and memory are normal. He exhibits a depressed mood.    Review of Systems  Psychiatric/Behavioral: Positive for depression and substance abuse.  All other systems reviewed and are negative.   Blood pressure (!) 95/59, pulse 89, temperature 98.6 F (37 C), temperature source Oral, resp. rate 19, SpO2 97 %.There is no height or weight on file to calculate BMI.  General Appearance: Casual  Eye Contact:  Good  Speech:  Normal Rate  Volume:  Normal  Mood:  Depressed, mild  Affect:   Congruent  Thought Process:  Coherent and Descriptions of Associations: Intact  Orientation:  Full (Time, Place, and Person)  Thought Content:  WDL and Logical  Suicidal Thoughts:  No  Homicidal Thoughts:  No  Memory:  Immediate;   Good Recent;   Good Remote;   Good  Judgement:  Fair  Insight:  Fair  Psychomotor Activity:  Normal  Concentration:  Concentration: Good and Attention Span: Good  Recall:  Good  Fund of Knowledge:  Fair  Language:  Good  Akathisia:  No  Handed:  Right  AIMS (if indicated):     Assets:  Leisure Time Physical Health Resilience  ADL's:  Intact  Cognition:  WNL  Sleep:       Mental Status Per Nursing Assessment::   On Admission:   depression and suicidal ideations  Demographic Factors:  Male  Loss Factors: NA  Historical Factors: NA  Risk Reduction Factors:   Sense of responsibility to family and Positive social support  Continued Clinical Symptoms:  Depression, mild  Cognitive Features That Contribute To Risk:  None    Suicide Risk:  Minimal: No identifiable suicidal ideation.  Patients presenting with no risk factors but with morbid ruminations; may be classified as minimal risk based on the severity of the depressive symptoms    Plan Of Care/Follow-up recommendations:  Activity:  as tolerated Diet:  heart healthy diet  LORD, JAMISON, NP 10/30/2016, 9:02 AM

## 2016-10-30 NOTE — ED Notes (Signed)
D:Pt is alert in his room with a flat/depressed affect. Pt reports si thoughts with a plan to walk into traffic. He reports that the thoughts started following the death of his father four weeks ago. Pt c/o lt side pain and was given prn medication. A:Offered support, encouragement and 15 minute checks.  R:Pt contracts with staff for safety. Safety maintained on the unit.

## 2016-10-30 NOTE — Consult Note (Signed)
[place] Psychiatry Consult   Reason for Consult:  Cocaine abuse with suicidal ideations Referring Physician:  EDP Patient Identification: [name] MRN:  [id number] Principal Diagnosis: Schizoaffective disorder, bipolar type [facility]) Diagnosis:   Patient Active Problem List   Diagnosis Date Noted  . Schizoaffective disorder, bipolar type ([place]) [F25.0] 02/05/2016    Priority: High  . Cocaine use disorder, moderate, dependence ([place]) [F14.20] 10/29/2016  . DKA (diabetic ketoacidoses) ([place]) [E13.10] 02/28/2016  . Chest pain [R07.9] 02/28/2016  . AKI (acute kidney injury) ([place]) [N17.9] 02/28/2016  . Suicidal ideation [R45.851] 02/06/2016  . Cannabis use disorder, moderate, dependence ([place]) [F12.20] 02/06/2016  . Hyperlipidemia [E78.5] 01/18/2015  . Hyperprolactinemia ([place]) [E22.1] 01/18/2015  . Diabetes mellitus ([place]) [E11.9] 03/11/2012  . HTN (hypertension) [I10] 03/11/2012    Total Time spent with patient: 45 minutes  Subjective:   [name] is a 56 y.o. male patient does not warrant admission.  HPI:  55 yo male who came to the ED after using cocaine and having suicidal ideations with a vague plan to hurt himself.  Reports his medications are "not working", educated him about cocaine use and medication effectiveness.  Medications changed and kept over night.  He has been at many inpatient psychiatric acilities, the last one was last month.   [name] big issue is being homeless.  Today, he denies suicidal/homicidal ideations, hallucinations, and withdrawal symptoms.  Stable for discharge with Rx with a bus pass.  Past Psychiatric History: schizoaffective disorder, substance abuse  Risk to Self: None Risk to Others: None Prior Inpatient Therapy: Prior Inpatient Therapy: Yes Prior Therapy Dates: multiple Prior Therapy Facilty/Provider(s): [facility], [facility] Reason for Treatment: SI, psychosis Prior Outpatient Therapy: Prior Outpatient Therapy: Yes Prior Therapy Dates:  current Prior Therapy Facilty/Provider(s): [facility] Reason for Treatment: schizophrenia Does patient have an ACCT team?: Unknown Does patient have Intensive In-House Services?  : No Does patient have [facility] services? : No Does patient have P4CC services?: No  Past Medical History:  Past Medical History:  Diagnosis Date  . Anxiety   . Bipolar affective disorder ([place])   . Bipolar disorder ([place]) 03/11/2012  . Depression   . Diabetes mellitus   . Diabetes mellitus ([place]) 03/11/2012  . Hepatitis 06/30/2013   Type C  . Hypertension   . Schizophrenia, schizo-affective ([place])     Past Surgical History:  Procedure Laterality Date  . FINGER SURGERY    . WISDOM TOOTH EXTRACTION     Family History:  Family History  Problem Relation Age of Onset  . Diabetes Mother   . Bipolar disorder Sister    Family Psychiatric  History: none Social History:  History  Alcohol Use No     History  Drug Use  . Types: Marijuana, Cocaine    Comment: 4 days ago last THC and cocoaine use... relapsed after 2 yrs     Social History   Social History  . Marital status: Single    Spouse name: N/A  . Number of children: N/A  . Years of education: N/A   Social History Main Topics  . Smoking status: Never Smoker  . Smokeless tobacco: Never Used  . Alcohol use No  . Drug use: Yes    Types: Marijuana, Cocaine     Comment: 4 days ago last THC and cocoaine use... relapsed after 2 yrs   . Sexual activity: Yes    Birth control/ protection: Condom     Comment: marijuana 25 days ago   Other Topics Concern  .  None   Social History Narrative  . None   Additional Social History:    Allergies:   Allergies  Allergen Reactions  . Wellbutrin [Bupropion] Hives and Swelling    Labs:  Results for orders placed or performed during the hospital encounter of 10/29/16 (from the past 48 hour(s))  Rapid urine drug screen (hospital performed)     Status: Abnormal   Collection Time: 10/29/16  2:34 AM  Result  Value Ref Range   Opiates NONE DETECTED NONE DETECTED   Cocaine POSITIVE (A) NONE DETECTED   Benzodiazepines NONE DETECTED NONE DETECTED   Amphetamines NONE DETECTED NONE DETECTED   Tetrahydrocannabinol NONE DETECTED NONE DETECTED   Barbiturates NONE DETECTED NONE DETECTED    Comment:        DRUG SCREEN FOR MEDICAL PURPOSES ONLY.  IF CONFIRMATION IS NEEDED FOR ANY PURPOSE, NOTIFY LAB WITHIN 5 DAYS.        LOWEST DETECTABLE LIMITS FOR URINE DRUG SCREEN Drug Class       Cutoff (ng/mL) Amphetamine      1000 Barbiturate      200 Benzodiazepine   454 Tricyclics       098 Opiates          300 Cocaine          300 THC              50   Comprehensive metabolic panel     Status: Abnormal   Collection Time: 10/29/16  2:41 AM  Result Value Ref Range   Sodium 132 (L) 135 - 145 mmol/L   Potassium 4.1 3.5 - 5.1 mmol/L   Chloride 99 (L) 101 - 111 mmol/L   CO2 21 (L) 22 - 32 mmol/L   Glucose, Bld 424 (H) 65 - 99 mg/dL   BUN 25 (H) 6 - 20 mg/dL   Creatinine, Ser 1.47 (H) 0.61 - 1.24 mg/dL   Calcium 9.6 8.9 - 10.3 mg/dL   Total Protein 8.4 (H) 6.5 - 8.1 g/dL   Albumin 4.4 3.5 - 5.0 g/dL   AST 24 15 - 41 U/L   ALT 17 17 - 63 U/L   Alkaline Phosphatase 74 38 - 126 U/L   Total Bilirubin 2.0 (H) 0.3 - 1.2 mg/dL   GFR calc non Af Amer 52 (L) >60 mL/min   GFR calc Af Amer 60 (L) >60 mL/min    Comment: (NOTE) The eGFR has been calculated using the CKD EPI equation. This calculation has not been validated in all clinical situations. eGFR's persistently <60 mL/min signify possible Chronic Kidney Disease.    Anion gap 12 5 - 15  cbc     Status: Abnormal   Collection Time: 10/29/16  2:41 AM  Result Value Ref Range   WBC 10.9 (H) 4.0 - 10.5 K/uL   RBC 4.35 4.22 - 5.81 MIL/uL   Hemoglobin 12.7 (L) 13.0 - 17.0 g/dL   HCT 36.2 (L) 39.0 - 52.0 %   MCV 83.2 78.0 - 100.0 fL   MCH 29.2 26.0 - 34.0 pg   MCHC 35.1 30.0 - 36.0 g/dL   RDW 12.5 11.5 - 15.5 %   Platelets 244 150 - 400 K/uL   Ethanol     Status: None   Collection Time: 10/29/16  2:42 AM  Result Value Ref Range   Alcohol, Ethyl (B) <5 <5 mg/dL    Comment:        LOWEST DETECTABLE LIMIT FOR SERUM ALCOHOL IS 5 mg/dL FOR MEDICAL  PURPOSES ONLY   Salicylate level     Status: None   Collection Time: 10/29/16  2:42 AM  Result Value Ref Range   Salicylate Lvl <4.0 2.8 - 30.0 mg/dL  Acetaminophen level     Status: Abnormal   Collection Time: 10/29/16  2:42 AM  Result Value Ref Range   Acetaminophen (Tylenol), Serum <10 (L) 10 - 30 ug/mL    Comment:        THERAPEUTIC CONCENTRATIONS VARY SIGNIFICANTLY. A RANGE OF 10-30 ug/mL MAY BE AN EFFECTIVE CONCENTRATION FOR MANY PATIENTS. HOWEVER, SOME ARE BEST TREATED AT CONCENTRATIONS OUTSIDE THIS RANGE. ACETAMINOPHEN CONCENTRATIONS >150 ug/mL AT 4 HOURS AFTER INGESTION AND >50 ug/mL AT 12 HOURS AFTER INGESTION ARE OFTEN ASSOCIATED WITH TOXIC REACTIONS.   CBG monitoring, ED     Status: Abnormal   Collection Time: 10/29/16  8:08 AM  Result Value Ref Range   Glucose-Capillary 317 (H) 65 - 99 mg/dL   Comment 1 Notify RN    Comment 2 Document in Chart   CBG monitoring, ED     Status: Abnormal   Collection Time: 10/29/16 12:33 PM  Result Value Ref Range   Glucose-Capillary 239 (H) 65 - 99 mg/dL   Comment 1 Notify RN    Comment 2 Document in Chart   CBG monitoring, ED     Status: Abnormal   Collection Time: 10/29/16  5:37 PM  Result Value Ref Range   Glucose-Capillary 160 (H) 65 - 99 mg/dL   Comment 1 Notify RN   CBG monitoring, ED     Status: Abnormal   Collection Time: 10/29/16  9:46 PM  Result Value Ref Range   Glucose-Capillary 189 (H) 65 - 99 mg/dL  CBG monitoring, ED     Status: Abnormal   Collection Time: 10/30/16  7:50 AM  Result Value Ref Range   Glucose-Capillary 253 (H) 65 - 99 mg/dL    Current Facility-Administered Medications  Medication Dose Route Frequency Provider Last Rate Last Dose  . acetaminophen (TYLENOL) tablet 650 mg  650 mg Oral  Q4H PRN Orpah Greek, MD   650 mg at 10/30/16 0823  . alum & mag hydroxide-simeth (MAALOX/MYLANTA) 200-200-20 MG/5ML suspension 30 mL  30 mL Oral PRN Orpah Greek, MD      . ARIPiprazole (ABILIFY) tablet 15 mg  15 mg Oral Daily Orpah Greek, MD   15 mg at 10/29/16 1118  . FLUoxetine (PROZAC) capsule 40 mg  40 mg Oral Daily Orpah Greek, MD   40 mg at 10/29/16 1118  . glipiZIDE (GLUCOTROL) tablet 10 mg  10 mg Oral BID AC Orpah Greek, MD   10 mg at 10/30/16 0808  . hydrOXYzine (ATARAX/VISTARIL) tablet 25 mg  25 mg Oral TID PRN Orpah Greek, MD      . ibuprofen (ADVIL,MOTRIN) tablet 600 mg  600 mg Oral Q8H PRN Orpah Greek, MD      . insulin aspart (novoLOG) injection 0-15 Units  0-15 Units Subcutaneous TID WC Orpah Greek, MD   8 Units at 10/30/16 0809  . insulin detemir (LEVEMIR) injection 50 Units  50 Units Subcutaneous QHS Orpah Greek, MD   50 Units at 10/29/16 2152  . metFORMIN (GLUCOPHAGE) tablet 1,000 mg  1,000 mg Oral BID WC Orpah Greek, MD   1,000 mg at 10/30/16 0810  . ondansetron (ZOFRAN) tablet 4 mg  4 mg Oral Q8H PRN Orpah Greek, MD      . QUEtiapine (  SEROQUEL) tablet 200 mg  200 mg Oral QHS Orpah Greek, MD   200 mg at 10/29/16 2152  . zolpidem (AMBIEN) tablet 10 mg  10 mg Oral QHS PRN Orpah Greek, MD       Current Outpatient Prescriptions  Medication Sig Dispense Refill  . ARIPiprazole (ABILIFY) 15 MG tablet Take 15 mg by mouth daily.    Marland Kitchen FLUoxetine (PROZAC) 40 MG capsule Take 40 mg by mouth daily.    Marland Kitchen glipiZIDE (GLUCOTROL) 10 MG tablet Take 1 tablet (10 mg total) by mouth 2 (two) times daily before a meal. 60 tablet 0  . HUMALOG KWIKPEN 100 UNIT/ML KiwkPen Inject 0-10 Units into the skin 3 (three) times daily with meals.    . hydrOXYzine (ATARAX/VISTARIL) 25 MG tablet Take 1 tablet (25 mg total) by mouth 3 (three) times daily as needed for anxiety. 30 tablet 0   . insulin detemir (LEVEMIR) 100 UNIT/ML injection Inject 0.26 mLs (26 Units total) into the skin at bedtime. Schedule of insulin is prescribed as it was administered in the hospital.  And will be further managed by your outpatient provider. (Patient taking differently: Inject 50 Units into the skin at bedtime. Schedule of insulin is prescribed as it was administered in the hospital.  And will be further managed by your outpatient provider.) 10 mL 11  . metFORMIN (GLUCOPHAGE) 1000 MG tablet Take 1 tablet (1,000 mg total) by mouth 2 (two) times daily with a meal. 60 tablet 0  . QUEtiapine (SEROQUEL) 200 MG tablet Take 200 mg by mouth at bedtime.    Marland Kitchen zolpidem (AMBIEN) 5 MG tablet Take 5 mg by mouth at bedtime as needed for sleep.    Marland Kitchen zolpidem (AMBIEN) 10 MG tablet Take 1 tablet (10 mg total) by mouth at bedtime as needed for sleep. 30 tablet 0    Musculoskeletal: Strength & Muscle Tone: within normal limits Gait & Station: normal Patient leans: N/A  Psychiatric Specialty Exam: Physical Exam  Constitutional: He is oriented to person, place, and time. He appears well-developed and well-nourished.  HENT:  Head: Normocephalic.  Neck: Normal range of motion.  Respiratory: Effort normal.  Musculoskeletal: Normal range of motion.  Neurological: He is alert and oriented to person, place, and time.  Psychiatric: His speech is normal and behavior is normal. Judgment and thought content normal. Cognition and memory are normal. He exhibits a depressed mood.    Review of Systems  Psychiatric/Behavioral: Positive for depression and substance abuse.  All other systems reviewed and are negative.   Blood pressure (!) 95/59, pulse 89, temperature 98.6 F (37 C), temperature source Oral, resp. rate 19, SpO2 97 %.There is no height or weight on file to calculate BMI.  General Appearance: Casual  Eye Contact:  Good  Speech:  Normal Rate  Volume:  Normal  Mood:  Depressed, mild  Affect:  Congruent   Thought Process:  Coherent and Descriptions of Associations: Intact  Orientation:  Full (Time, Place, and Person)  Thought Content:  WDL and Logical  Suicidal Thoughts:  No  Homicidal Thoughts:  No  Memory:  Immediate;   Good Recent;   Good Remote;   Good  Judgement:  Fair  Insight:  Fair  Psychomotor Activity:  Normal  Concentration:  Concentration: Good and Attention Span: Good  Recall:  Good  Fund of Knowledge:  Fair  Language:  Good  Akathisia:  No  Handed:  Right  AIMS (if indicated):     Assets:  Leisure  Time Physical Health Resilience  ADL's:  Intact  Cognition:  WNL  Sleep:        Treatment Plan Summary: Daily contact with patient to assess and evaluate symptoms and progress in treatment, Medication management and Plan schizoaffective disorder, bipolar type:  -Crisis stabilization -Medication management:  Started medical medications along with Vistaril 25 mg TID PRN anxiety, Seroquel 200 mg at bedtime for sleep and mood, Prozac 40 mg daily for depression, and Abilify 15 mg daily for psychosis -Individual and substance abuse counseling  Disposition: No evidence of imminent risk to self or others at present.    Waylan Boga, NP 10/30/2016 8:56 AM  Patient seen face-to-face for psychiatric evaluation, chart reviewed and case discussed with the physician extender and developed treatment plan. Reviewed the information documented and agree with the treatment plan. Corena Pilgrim, MD

## 2016-10-30 NOTE — Discharge Instructions (Signed)
For your ongoing behavioral health needs, you are advised to continue treatment with [facility]:       [facility]      [address]      [place], [place] [address]      [phone]

## 2016-10-30 NOTE — BH Assessment (Signed)
BHH Assessment Progress Note  Per Thedore Mins, MD, this pt does not require psychiatric hospitalization at this time.  Pt is to be discharged from [facility] with recommendation to continue treatment at [facility] in [place], his current outpatient provider.  This has been included in pt's discharge instructions.  Pt's nurse, Jan, has been notified.  Doylene Canning, MA Triage Specialist [phone]

## 2020-01-02 ENCOUNTER — Emergency Department (HOSPITAL_COMMUNITY)
Admission: EM | Admit: 2020-01-02 | Discharge: 2020-01-02 | Disposition: A | Discharge status: Home / Self Care | Payer: Medicare Other | Attending: Emergency Medicine | Admitting: Emergency Medicine

## 2020-01-02 ENCOUNTER — Other Ambulatory Visit: Payer: Self-pay

## 2020-01-02 ENCOUNTER — Encounter (HOSPITAL_COMMUNITY): Payer: Self-pay | Admitting: Registered Nurse

## 2020-01-02 DIAGNOSIS — Z5900 Homelessness unspecified: Secondary | ICD-10-CM

## 2020-01-02 DIAGNOSIS — R1084 Generalized abdominal pain: Secondary | ICD-10-CM | POA: Insufficient documentation

## 2020-01-02 DIAGNOSIS — F1410 Cocaine abuse, uncomplicated: Secondary | ICD-10-CM | POA: Diagnosis present

## 2020-01-02 DIAGNOSIS — Z20822 Contact with and (suspected) exposure to covid-19: Secondary | ICD-10-CM | POA: Diagnosis not present

## 2020-01-02 DIAGNOSIS — F1490 Cocaine use, unspecified, uncomplicated: Secondary | ICD-10-CM | POA: Insufficient documentation

## 2020-01-02 DIAGNOSIS — Z794 Long term (current) use of insulin: Secondary | ICD-10-CM | POA: Diagnosis not present

## 2020-01-02 DIAGNOSIS — I10 Essential (primary) hypertension: Secondary | ICD-10-CM | POA: Diagnosis not present

## 2020-01-02 DIAGNOSIS — F1494 Cocaine use, unspecified with cocaine-induced mood disorder: Secondary | ICD-10-CM

## 2020-01-02 DIAGNOSIS — F250 Schizoaffective disorder, bipolar type: Secondary | ICD-10-CM | POA: Diagnosis present

## 2020-01-02 DIAGNOSIS — E1165 Type 2 diabetes mellitus with hyperglycemia: Secondary | ICD-10-CM | POA: Insufficient documentation

## 2020-01-02 DIAGNOSIS — Z79899 Other long term (current) drug therapy: Secondary | ICD-10-CM | POA: Diagnosis not present

## 2020-01-02 DIAGNOSIS — R45851 Suicidal ideations: Secondary | ICD-10-CM | POA: Diagnosis not present

## 2020-01-02 DIAGNOSIS — F319 Bipolar disorder, unspecified: Secondary | ICD-10-CM | POA: Diagnosis present

## 2020-01-02 LAB — CBC WITH DIFFERENTIAL/PLATELET
Abs Immature Granulocytes: 0.01 10*3/uL (ref 0.00–0.07)
Basophils Absolute: 0 10*3/uL (ref 0.0–0.1)
Basophils Relative: 0 %
Eosinophils Absolute: 0.1 10*3/uL (ref 0.0–0.5)
Eosinophils Relative: 1 %
HCT: 37.1 % — ABNORMAL LOW (ref 39.0–52.0)
Hemoglobin: 12.7 g/dL — ABNORMAL LOW (ref 13.0–17.0)
Immature Granulocytes: 0 %
Lymphocytes Relative: 26 %
Lymphs Abs: 1.8 10*3/uL (ref 0.7–4.0)
MCH: 29.7 pg (ref 26.0–34.0)
MCHC: 34.2 g/dL (ref 30.0–36.0)
MCV: 86.9 fL (ref 80.0–100.0)
Monocytes Absolute: 0.5 10*3/uL (ref 0.1–1.0)
Monocytes Relative: 7 %
Neutro Abs: 4.7 10*3/uL (ref 1.7–7.7)
Neutrophils Relative %: 66 %
Platelets: 268 10*3/uL (ref 150–400)
RBC: 4.27 MIL/uL (ref 4.22–5.81)
RDW: 11.9 % (ref 11.5–15.5)
WBC: 7.2 10*3/uL (ref 4.0–10.5)
nRBC: 0 % (ref 0.0–0.2)

## 2020-01-02 LAB — URINALYSIS, ROUTINE W REFLEX MICROSCOPIC
Bacteria, UA: NONE SEEN
Bilirubin Urine: NEGATIVE
Glucose, UA: >=500 mg/dL — AB
Hgb urine dipstick: NEGATIVE
Ketones, ur: 5 mg/dL — AB
Leukocytes,Ua: NEGATIVE
Nitrite: NEGATIVE
Protein, ur: NEGATIVE mg/dL
Specific Gravity, Urine: 1.032 — ABNORMAL HIGH (ref 1.005–1.030)
pH: 6 (ref 5.0–8.0)

## 2020-01-02 LAB — RAPID URINE DRUG SCREEN, HOSP PERFORMED
Amphetamines: NOT DETECTED
Barbiturates: NOT DETECTED
Benzodiazepines: NOT DETECTED
Cocaine: POSITIVE — AB
Opiates: NOT DETECTED
Tetrahydrocannabinol: NOT DETECTED

## 2020-01-02 LAB — COMPREHENSIVE METABOLIC PANEL
ALT: 11 U/L (ref 0–44)
AST: 30 U/L (ref 15–41)
Albumin: 3.8 g/dL (ref 3.5–5.0)
Alkaline Phosphatase: 77 U/L (ref 38–126)
Anion gap: 12 (ref 5–15)
BUN: 16 mg/dL (ref 6–20)
CO2: 23 mmol/L (ref 22–32)
Calcium: 9.3 mg/dL (ref 8.9–10.3)
Chloride: 97 mmol/L — ABNORMAL LOW (ref 98–111)
Creatinine, Ser: 0.94 mg/dL (ref 0.61–1.24)
GFR calc Af Amer: >60 mL/min (ref >60)
GFR calc non Af Amer: >60 mL/min (ref >60)
Glucose, Bld: 369 mg/dL — ABNORMAL HIGH (ref 70–99)
Potassium: 5.2 mmol/L — ABNORMAL HIGH (ref 3.5–5.1)
Sodium: 132 mmol/L — ABNORMAL LOW (ref 135–145)
Total Bilirubin: 1.3 mg/dL — ABNORMAL HIGH (ref 0.3–1.2)
Total Protein: 7.9 g/dL (ref 6.5–8.1)

## 2020-01-02 LAB — SALICYLATE LEVEL: Salicylate Lvl: <7 mg/dL — ABNORMAL LOW (ref 7.0–30.0)

## 2020-01-02 LAB — ETHANOL: Alcohol, Ethyl (B): <10 mg/dL (ref <10)

## 2020-01-02 LAB — LIPASE, BLOOD: Lipase: 25 U/L (ref 11–51)

## 2020-01-02 LAB — CBG MONITORING, ED
Glucose-Capillary: 382 mg/dL — ABNORMAL HIGH (ref 70–99)
Glucose-Capillary: 290 mg/dL — ABNORMAL HIGH (ref 70–99)
Glucose-Capillary: 203 mg/dL — ABNORMAL HIGH (ref 70–99)

## 2020-01-02 LAB — SARS CORONAVIRUS 2 BY RT PCR (HOSPITAL ORDER, PERFORMED IN ~~LOC~~ HOSPITAL LAB): SARS Coronavirus 2: NEGATIVE

## 2020-01-02 LAB — HEMOGLOBIN A1C
Hgb A1c MFr Bld: 11.4 % — ABNORMAL HIGH (ref 4.8–5.6)
Mean Plasma Glucose: 280.48 mg/dL

## 2020-01-02 LAB — ACETAMINOPHEN LEVEL: Acetaminophen (Tylenol), Serum: <10 ug/mL — ABNORMAL LOW (ref 10–30)

## 2020-01-02 MED ORDER — DIVALPROEX SODIUM 500 MG PO DR TAB
500.00 | DELAYED_RELEASE_TABLET | ORAL | Status: DC
Start: 2019-12-31 — End: 2020-01-02

## 2020-01-02 MED ORDER — FLUOXETINE HCL 40 MG PO CAPS: 40.0000 mg | ORAL_CAPSULE | Freq: Every day | ORAL | 0 refills | Status: DC

## 2020-01-02 MED ORDER — SENNOSIDES-DOCUSATE SODIUM 8.6-50 MG PO TABS
1.00 | ORAL_TABLET | ORAL | Status: DC
Start: ? — End: 2020-01-02

## 2020-01-02 MED ORDER — INSULIN LISPRO 100 UNIT/ML ~~LOC~~ SOLN
1.00 | SUBCUTANEOUS | Status: DC
Start: ? — End: 2020-01-02

## 2020-01-02 MED ORDER — ACETAMINOPHEN 325 MG PO TABS
650.00 | ORAL_TABLET | ORAL | Status: DC
Start: ? — End: 2020-01-02

## 2020-01-02 MED ORDER — ARIPIPRAZOLE 15 MG PO TABS: 15.0000 mg | ORAL_TABLET | Freq: Every day | ORAL | 0 refills | Status: DC

## 2020-01-02 MED ORDER — THIAMINE HCL 100 MG PO TABS
100.00 | ORAL_TABLET | ORAL | Status: DC
Start: 2020-01-01 — End: 2020-01-02

## 2020-01-02 MED ORDER — GABAPENTIN 100 MG PO CAPS
300.00 | ORAL_CAPSULE | ORAL | Status: DC
Start: 2019-12-31 — End: 2020-01-02

## 2020-01-02 MED ORDER — INSULIN DETEMIR 100 UNIT/ML ~~LOC~~ SOLN: 50.0000 [IU] | Freq: Every day | SUBCUTANEOUS | 0 refills | Status: DC

## 2020-01-02 MED ORDER — ATORVASTATIN CALCIUM 40 MG PO TABS
20.00 | ORAL_TABLET | ORAL | Status: DC
Start: ? — End: 2020-01-02

## 2020-01-02 MED ORDER — ALUM & MAG HYDROXIDE-SIMETH 200-200-20 MG/5ML PO SUSP
30.00 | ORAL | Status: DC
Start: ? — End: 2020-01-02

## 2020-01-02 MED ORDER — INSULIN LISPRO (1 UNIT DIAL) 100 UNIT/ML (KWIKPEN): 0.0000 [IU] | PEN_INJECTOR | Freq: Three times a day (TID) | SUBCUTANEOUS | Status: DC

## 2020-01-02 MED ORDER — INSULIN LISPRO 100 UNIT/ML ~~LOC~~ SOLN
1.00 | SUBCUTANEOUS | Status: DC
Start: 2019-12-31 — End: 2020-01-02

## 2020-01-02 MED ORDER — SODIUM CHLORIDE 0.9 % IV BOLUS
1000.0000 mL | Freq: Once | INTRAVENOUS | Status: AC
  Administered 2020-01-02: 1000 mL via INTRAVENOUS

## 2020-01-02 MED ORDER — GENERIC EXTERNAL MEDICATION
Status: DC
Start: ? — End: 2020-01-02

## 2020-01-02 MED ORDER — HUMALOG KWIKPEN 100 UNIT/ML ~~LOC~~ SOPN: 0.0000 [IU] | PEN_INJECTOR | Freq: Three times a day (TID) | SUBCUTANEOUS | 0 refills | Status: DC

## 2020-01-02 MED ORDER — ZOLPIDEM TARTRATE 5 MG PO TABS: 5.0000 mg | ORAL_TABLET | Freq: Every evening | ORAL | 0 refills | Status: DC | PRN

## 2020-01-02 MED ORDER — INSULIN DETEMIR 100 UNIT/ML ~~LOC~~ SOLN
1.00 | SUBCUTANEOUS | Status: DC
Start: ? — End: 2020-01-02

## 2020-01-02 MED ORDER — IBUPROFEN 600 MG PO TABS
600.00 | ORAL_TABLET | ORAL | Status: DC
Start: ? — End: 2020-01-02

## 2020-01-02 MED ORDER — FOLIC ACID 1 MG PO TABS
1.00 | ORAL_TABLET | ORAL | Status: DC
Start: 2020-01-01 — End: 2020-01-02

## 2020-01-02 MED ORDER — QUETIAPINE FUMARATE 200 MG PO TABS: 200.0000 mg | ORAL_TABLET | Freq: Every day | ORAL | 0 refills | Status: DC

## 2020-01-02 MED ORDER — METFORMIN HCL 1000 MG PO TABS: 1000.0000 mg | ORAL_TABLET | Freq: Two times a day (BID) | ORAL | 0 refills | Status: DC

## 2020-01-02 MED ORDER — THERA PO TABS
1.00 | ORAL_TABLET | ORAL | Status: DC
Start: 2020-01-01 — End: 2020-01-02

## 2020-01-02 MED ORDER — ASPIRIN 81 MG PO CHEW
81.00 | CHEWABLE_TABLET | ORAL | Status: DC
Start: 2020-01-01 — End: 2020-01-02

## 2020-01-02 MED ORDER — INSULIN ASPART 100 UNIT/ML ~~LOC~~ SOLN
0.0000 [IU] | Freq: Three times a day (TID) | SUBCUTANEOUS | Status: DC
  Administered 2020-01-02: 5 [IU] via SUBCUTANEOUS
  Filled 2020-01-02: qty 0.09

## 2020-01-02 MED ORDER — GLIPIZIDE 10 MG PO TABS: 10.0000 mg | ORAL_TABLET | Freq: Two times a day (BID) | ORAL | Status: DC

## 2020-01-02 MED ORDER — HYDROXYZINE PAMOATE 25 MG PO CAPS
50.00 | ORAL_CAPSULE | ORAL | Status: DC
Start: ? — End: 2020-01-02

## 2020-01-02 MED ORDER — HYDROXYZINE HCL 25 MG PO TABS: 25.0000 mg | ORAL_TABLET | Freq: Three times a day (TID) | ORAL | 0 refills | Status: DC | PRN

## 2020-01-02 MED ORDER — INSULIN ASPART 100 UNIT/ML ~~LOC~~ SOLN
0.0000 [IU] | Freq: Every day | SUBCUTANEOUS | Status: DC
  Filled 2020-01-02: qty 0.05

## 2020-01-02 MED ORDER — GLIPIZIDE 10 MG PO TABS: 10.0000 mg | ORAL_TABLET | Freq: Two times a day (BID) | ORAL | 0 refills | Status: DC

## 2020-01-02 MED ORDER — TRAZODONE HCL 50 MG PO TABS
100.00 | ORAL_TABLET | ORAL | Status: DC
Start: ? — End: 2020-01-02

## 2020-01-02 MED ORDER — DULOXETINE HCL 20 MG PO CPEP
20.00 | ORAL_CAPSULE | ORAL | Status: DC
Start: 2020-01-01 — End: 2020-01-02

## 2020-01-02 MED ORDER — HYDROXYZINE HCL 25 MG PO TABS: 25.0000 mg | ORAL_TABLET | Freq: Three times a day (TID) | ORAL | Status: DC | PRN

## 2020-01-02 NOTE — Patient Outreach (Signed)
[facility] received Bus ticket for Pt and [facility] contacted transportation for services to [employer].

## 2020-01-02 NOTE — ED Triage Notes (Signed)
Pt states that he has abd pain since this am as well as SI d/t the recent loss of his mother and sister. Pt lives in [place] and caught the [employer] bus to [place] bus depot where he was picked up by [employer].

## 2020-01-02 NOTE — ED Provider Notes (Addendum)
[facility]-EMERGENCY DEPT Provider Note   CSN: [id number] Arrival date & time: 01/02/20  0725     History Chief Complaint  Patient presents with  . Suicidal  . Abdominal Pain  . Hyperglycemia    [name] is a 59 y.o. male history of diabetes, bipolar, hepatitis, hypertension, schizophrenia, polysubstance abuse.  Patient presents today for suicidal ideations.  Reports he has been under increased stress for the past few months due to the loss of his mother and sister.  He reports that he planned to kill himself last night by shooting himself with his gun but the firearm was taken away by his cousin. He reports that he has attempted to take his own life in the past by overdosing on muscle relaxers a few years ago. He denies any ingestions or injury.  Additionally patient reports he has been experiencing abdominal pain for the past 1 day.  He describes a generalized aching pain intermittent no clear inciting event or aggravating/factors.  Patient denies fever/chills, chest pain/shortness of breath, nausea/vomiting, diarrhea, fall/injury, hallucinations, homicidal ideations or any additional concerns. HPI      Past Medical History:  Diagnosis Date  . Anxiety   . Bipolar affective disorder (HCC)   . Bipolar disorder (HCC) 03/11/2012  . Depression   . Diabetes mellitus   . Diabetes mellitus (HCC) 03/11/2012  . Hepatitis 06/30/2013   Type C  . Hypertension   . Schizophrenia, schizo-affective [facility])     Patient Active Problem List   Diagnosis Date Noted  . Cocaine use disorder (HCC) 01/02/2020  . 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 Homelessness 01/02/2020  . Cocaine use disorder, moderate, dependence (HCC) 10/29/2016  . DKA (diabetic ketoacidoses) (HCC) 02/28/2016  . Chest pain 02/28/2016  . AKI (acute kidney injury) (HCC) 02/28/2016  . Suicidal ideation 02/06/2016  . Cannabis use disorder, moderate, dependence (HCC)  02/06/2016  . Schizoaffective disorder, bipolar type (HCC) 02/05/2016  . Hyperlipidemia 01/18/2015  . Hyperprolactinemia (HCC) 01/18/2015  . Diabetes mellitus (HCC) 03/11/2012  . HTN (hypertension) 03/11/2012    Past Surgical History:  Procedure Laterality Date  . FINGER SURGERY    . WISDOM TOOTH EXTRACTION         Family History  Problem Relation Age of Onset  . Diabetes Mother   . Bipolar disorder Sister     Social History   Tobacco Use  . Smoking status: Never Smoker  . Smokeless tobacco: Never Used  Substance Use Topics  . Alcohol use: No  . Drug use: Yes    Types: Marijuana, Cocaine    Comment: 4 days ago last THC and cocoaine use... relapsed after 2 yrs     Home Medications Prior to Admission medications   Medication Sig Start Date End Date Taking? Authorizing Provider  ARIPiprazole (ABILIFY) 15 MG tablet Take 1 tablet (15 mg total) by mouth daily. 10/30/16   Charm Rings, NP  FLUoxetine (PROZAC) 40 MG capsule Take 1 capsule (40 mg total) by mouth daily. 10/30/16   Charm Rings, NP  glipiZIDE (GLUCOTROL) 10 MG tablet Take 1 tablet (10 mg total) by mouth 2 (two) times daily before a meal. 10/30/16   Lord, Herminio Heads, NP  HUMALOG KWIKPEN 100 UNIT/ML KiwkPen Inject 0-10 Units into the skin 3 (three) times daily with meals. 09/11/16   [provider]  hydrOXYzine (ATARAX/VISTARIL) 25 MG tablet Take 1 tablet (25 mg total) by mouth 3 (three) times daily as needed for anxiety.  10/30/16   Charm Rings, NP  insulin detemir (LEVEMIR) 100 UNIT/ML injection Inject 0.26 mLs (26 Units total) into the skin at bedtime. Schedule of insulin is prescribed as it was administered in the hospital.  And will be further managed by your outpatient provider. Patient taking differently: Inject 50 Units into the skin at bedtime. Schedule of insulin is prescribed as it was administered in the hospital.  And will be further managed by your outpatient provider. 03/24/16   Adonis Brook, NP   metFORMIN (GLUCOPHAGE) 1000 MG tablet Take 1 tablet (1,000 mg total) by mouth 2 (two) times daily with a meal. 03/24/16   Adonis Brook, NP  QUEtiapine (SEROQUEL) 200 MG tablet Take 1 tablet (200 mg total) by mouth at bedtime. 10/30/16 11/29/16  Charm Rings, NP  zolpidem (AMBIEN) 10 MG tablet Take 1 tablet (10 mg total) by mouth at bedtime as needed for sleep. 03/24/16 04/23/16  Adonis Brook, NP  zolpidem (AMBIEN) 5 MG tablet Take 5 mg by mouth at bedtime as needed for sleep. 07/14/16   [provider]    Allergies    Wellbutrin [bupropion]  Review of Systems   Review of Systems Ten systems are reviewed and are negative for acute change except as noted in the HPI  Physical Exam Updated Vital Signs BP 139/88 (BP Location: Right Arm)   Pulse 67   Temp 98.9 F (37.2 C) (Oral)   Resp 20   Ht 5\' 9"  (1.753 m)   Wt 79.4 kg   SpO2 100%   BMI 25.84 kg/m   Physical Exam Constitutional:      General: He is not in acute distress.    Appearance: Normal appearance. He is well-developed. He is not ill-appearing or diaphoretic.  HENT:     Head: Normocephalic and atraumatic.  Eyes:     General: Vision grossly intact. Gaze aligned appropriately.     Pupils: Pupils are equal, round, and reactive to light.  Neck:     Trachea: Trachea and phonation normal.  Pulmonary:     Effort: Pulmonary effort is normal. No respiratory distress.  Abdominal:     General: There is no distension.     Palpations: Abdomen is soft.     Tenderness: There is generalized abdominal tenderness. There is no guarding or rebound. Negative signs include Murphy's sign and McBurney's sign.  Musculoskeletal:        General: Normal range of motion.     Cervical back: Normal range of motion.  Skin:    General: Skin is warm and dry.  Neurological:     Mental Status: He is alert.     GCS: GCS eye subscore is 4. GCS verbal subscore is 5. GCS motor subscore is 6.     Comments: Speech is clear and goal oriented,  follows commands Major Cranial nerves without deficit, no facial droop Moves extremities without ataxia, coordination intact  Psychiatric:        Attention and Perception: Attention normal. He does not perceive auditory or visual hallucinations.        Behavior: Behavior normal. Behavior is cooperative.        Thought Content: Thought content includes suicidal ideation. Thought content does not include homicidal ideation. Thought content includes suicidal plan.     ED Results / Procedures / Treatments   Labs (all labs ordered are listed, but only abnormal results are displayed) Labs Reviewed  COMPREHENSIVE METABOLIC PANEL - Abnormal; Notable for the following components:  Result Value   Sodium 132 (*)    Potassium 5.2 (*)    Chloride 97 (*)    Glucose, Bld 369 (*)    Total Bilirubin 1.3 (*)    All other components within normal limits  URINALYSIS, ROUTINE W REFLEX MICROSCOPIC - Abnormal; Notable for the following components:   Specific Gravity, Urine 1.032 (*)    Glucose, UA >=500 (*)    Ketones, ur 5 (*)    All other components within normal limits  RAPID URINE DRUG SCREEN, HOSP PERFORMED - Abnormal; Notable for the following components:   Cocaine POSITIVE (*)    All other components within normal limits  SALICYLATE LEVEL - Abnormal; Notable for the following components:   Salicylate Lvl <7.0 (*)    All other components within normal limits  ACETAMINOPHEN LEVEL - Abnormal; Notable for the following components:   Acetaminophen (Tylenol), Serum <10 (*)    All other components within normal limits  CBC WITH DIFFERENTIAL/PLATELET - Abnormal; Notable for the following components:   Hemoglobin 12.7 (*)    HCT 37.1 (*)    All other components within normal limits  HEMOGLOBIN A1C - Abnormal; Notable for the following components:   Hgb A1c MFr Bld 11.4 (*)    All other components within normal limits  CBG MONITORING, ED - Abnormal; Notable for the following components:    Glucose-Capillary 382 (*)    All other components within normal limits  CBG MONITORING, ED - Abnormal; Notable for the following components:   Glucose-Capillary 290 (*)    All other components within normal limits  SARS CORONAVIRUS 2 BY RT PCR (HOSPITAL ORDER, PERFORMED IN [facility])  LIPASE, BLOOD  ETHANOL  CBC WITH DIFFERENTIAL/PLATELET  CBG MONITORING, ED    EKG None  Radiology No results found.  Procedures Procedures (including critical care time)  Medications Ordered in ED Medications  hydrOXYzine (ATARAX/VISTARIL) tablet 25 mg (has no administration in time range)  insulin aspart (novoLOG) injection 0-9 Units (5 Units Subcutaneous Given 01/02/20 1157)  insulin aspart (novoLOG) injection 0-5 Units (has no administration in time range)  sodium chloride 0.9 % bolus 1,000 mL (1,000 mLs Intravenous New Bag/Given 01/02/20 1159)    ED Course  I have reviewed the triage vital signs and the nursing notes.  Pertinent labs & imaging results that were available during my care of the patient were reviewed by me and considered in my medical decision making (see chart for details).    MDM Rules/Calculators/A&P                      Additional History Obtained: 1. Nursing notes from this visit. - I ordered, reviewed and interpreted labs which include: CBC shows no leukocytosis to suggest infection, anemia of 12.7 appears baseline CMP shows mild hyponatremia and hypochloremia which will be repleted with normal saline, mild hyperkalemia of 5.2.  No emergent electrolyte derangement, evidence of acute kidney injury, emergent elevation of LFTs or anion gap.  Patient does not appear to be in DKA. Lipase within normal limits doubt pancreatitis Urinalysis shows 5 ketones and glucose, patient does not appear to be in DKA. UDS positive for cocaine, patient does not appear intoxicated Ethanol negative patient does not appear intoxicated or in withdrawal Tylenol and salicylate  levels are negative patient does not appear to have toxic ingestions of substances - Patient was reassessed, he has no abdominal tenderness he is requesting food and drink.  Suspect patient's prior pain may  be related to his chronic abdominal pain, doubt pancreatitis, appendicitis, SBO, perforation or other emergent pathologies requiring CT scan additionally no evidence of DKA or other emergent pathologies requiring further work-up at this time.  Patient will be allowed to eat and drink here in the ER.  I consulted pharmacy to help dose insulin for sliding scale, patient will be placed in psych hold for TTS evaluation.  Discussed case and plan with Dr. Tomi Bamberger who agrees with med clearance and psych disposition.   At this time there does not appear to be any evidence of an acute emergency medical condition and the patient appears stable for psychiatry disposition.   Note: Portions of this report may have been transcribed using voice recognition software. Every effort was made to ensure accuracy; however, inadvertent computerized transcription errors may still be present. Final Clinical Impression(s) / ED Diagnoses Final diagnoses:  Suicidal ideation    Rx / DC Orders ED Discharge Orders    None       Deliah Boston, PA-C 01/02/20 1353    Gari Crown 01/02/20 1354    Dorie Rank, MD 01/03/20 [phone]

## 2020-01-02 NOTE — ED Notes (Signed)
Pt provided with snack

## 2020-01-02 NOTE — Discharge Instructions (Signed)
For your behavioral health needs, you are advised to follow up with [facility].  You have an intake appointment scheduled for Tuesday, January 10, 2020 at 10:00 am:       [facility]      [address]      [place], [place] [address]      [phone]  For your shelter needs, you are advised to contact [facility] at your earliest opportunity:       [facility]      [phone]  For other supportive services, contact the [facility]:       [facility]      [address] [place], [place] [address]      [phone]

## 2020-01-02 NOTE — Patient Outreach (Signed)
Pt was accepted to [facility] an are waiting for details for his Bus Pass.

## 2020-01-02 NOTE — ED Notes (Signed)
Peer support working on transportation

## 2020-01-02 NOTE — BH Assessment (Signed)
Assessment Note  [name] is an 59 y.o. male that presents this date requesting assistance with ongoing SA issues. Patient states he has been receiving services from [facility] that assists with medication management from ongoing depression although states he has not met with that provider since 2020. Patient states he is currently homeless and reports multiple stressors to include: financial issues, lack of support and problems with continued drug use. Patient denies any S/I, H/I or AVH. Patient reports current mental health symptoms to include: irritable mood, difficulty concentrating, low self-esteem and social isolation.   Per admission notes. Patient presents today for suicidal ideations. Reports he has been under increased stress for the past few months due to the loss of his mother and sister. He reports that he planned to kill himself last night by shooting himself with his gun but the firearm was taken away by his cousin. He reports that he has attempted to take his own life in the past by overdosing on muscle relaxer's a few years ago. He denies any ingestions or injury.  Patient renders conflicting history as he initially reported S/I on admission although denies at the time of admission. Patient denied any access to firearms this date. Patient reports ongoing SA issues to include sporadic use of cocaine. Patient states he used a unknown amount on 01/02/20. Patient denies any other use of illicit substances. Patient's UDS was positive for cocaine this date.   Pt was dressed in scrubs, alert, oriented x4, with logical and coherent speech. Eye contact was fair. Pt's mood was depressed and sad. Pt impulse control and insight are poor. Pt's judgement is impaired. Pt's motor activity is unremarkable. Pt was cooperative and calm throughout the assessment. Per Rankin NP patient does not meet inpatient criteria and will be provided with OP resources on discharge.   Diagnosis: F33.2 MDD recurrent  without psychotic features, severe, Cocaine use  Past Medical History:  Past Medical History:  Diagnosis Date  . Anxiety   . Bipolar affective disorder ([place])   . Bipolar disorder ([place]) 03/11/2012  . Depression   . Diabetes mellitus   . Diabetes mellitus ([place]) 03/11/2012  . Hepatitis 06/30/2013   Type C  . Hypertension   . Schizophrenia, schizo-affective ([place])     Past Surgical History:  Procedure Laterality Date  . FINGER SURGERY    . WISDOM TOOTH EXTRACTION      Family History:  Family History  Problem Relation Age of Onset  . Diabetes Mother   . Bipolar disorder Sister     Social History:  reports that he has never smoked. He has never used smokeless tobacco. He reports current drug use. Drugs: Marijuana and Cocaine. He reports that he does not drink alcohol.  Additional Social History:  Alcohol / Drug Use Pain Medications: See MAR Prescriptions: See MAR Over the Counter: See MAR History of alcohol / drug use?: Yes Longest period of sobriety (when/how long): Unknown Negative Consequences of Use: (Denies) Withdrawal Symptoms: (Denies) Substance #1 Name of Substance 1: Cocaine 1 - Age of First Use: 30 1 - Amount (size/oz): Varies 1 - Frequency: Varies 1 - Duration: Ongoing 1 - Last Use / Amount: 01/01/20 unknown amount UDS positive  CIWA: CIWA-Ar BP: 139/88 Pulse Rate: 67 COWS:    Allergies:  Allergies  Allergen Reactions  . Wellbutrin [Bupropion] Hives and Swelling    Home Medications: (Not in a hospital admission)   OB/GYN Status:  No LMP for male patient.  General Assessment Data Location of  Assessment: [facility] ED TTS Assessment: In system Is this a Tele or Face-to-Face Assessment?: Face-to-Face Is this an Initial Assessment or a Re-assessment for this encounter?: Initial Assessment Patient Accompanied by:: N/A Language Other than English: No Living Arrangements: Other (Comment) What gender do you identify as?: Male Date Telepsych consult ordered in  [facility]: 01/02/20 Time Telepsych consult ordered in [facility]: 6 Marital status: Single Living Arrangements: Alone Can pt return to current living arrangement?: Yes Admission Status: Voluntary Is patient capable of signing voluntary admission?: Yes Referral Source: Self/Family/Friend Insurance type: Medicare     Crisis Care Plan Living Arrangements: Alone Legal Guardian: (NA) Name of Psychiatrist: [facility] Name of Therapist: [facility]  Education Status Is patient currently in school?: No Is the patient employed, unemployed or receiving disability?: Unemployed  Risk to self with the past 6 months Suicidal Ideation: No Has patient been a risk to self within the past 6 months prior to admission? : No Suicidal Intent: No Has patient had any suicidal intent within the past 6 months prior to admission? : No Is patient at risk for suicide?: No Suicidal Plan?: No Has patient had any suicidal plan within the past 6 months prior to admission? : No Access to Means: No What has been your use of drugs/alcohol within the last 12 months?: Current use Previous Attempts/Gestures: No How many times?: 0 Other Self Harm Risks: (NA) Triggers for Past Attempts: (NA) Intentional Self Injurious Behavior: None Family Suicide History: No Recent stressful life event(s): Other (Comment)(Homeless) Persecutory voices/beliefs?: No Depression: Yes Depression Symptoms: Feeling worthless/self pity Substance abuse history and/or treatment for substance abuse?: No Suicide prevention information given to non-admitted patients: Not applicable  Risk to Others within the past 6 months Homicidal Ideation: No Does patient have any lifetime risk of violence toward others beyond the six months prior to admission? : No Thoughts of Harm to Others: No Current Homicidal Intent: No Current Homicidal Plan: No Access to Homicidal Means: No Identified Victim: NA History of harm to others?: No Assessment of Violence: None  Noted Violent Behavior Description: NA Does patient have access to weapons?: No Criminal Charges Pending?: No Does patient have a court date: No Is patient on probation?: No  Psychosis Hallucinations: None noted Delusions: None noted  Mental Status Report Appearance/Hygiene: Unremarkable Eye Contact: Fair Motor Activity: Freedom of movement Speech: Logical/coherent Level of Consciousness: Alert Mood: Depressed Affect: Appropriate to circumstance Anxiety Level: Minimal Thought Processes: Coherent, Relevant Judgement: Partial Orientation: Person, Place, Time Obsessive Compulsive Thoughts/Behaviors: None  Cognitive Functioning Concentration: Normal Memory: Recent Intact, Remote Intact Is patient IDD: No Insight: Poor Impulse Control: Poor Appetite: Good Have you had any weight changes? : No Change Sleep: No Change Total Hours of Sleep: 7 Vegetative Symptoms: None  ADLScreening [facility] Services) Patient's cognitive ability adequate to safely complete daily activities?: Yes Patient able to express need for assistance with ADLs?: Yes Independently performs ADLs?: Yes (appropriate for developmental age)  Prior Inpatient Therapy Prior Inpatient Therapy: Yes Prior Therapy Dates: 2019 Prior Therapy Facilty/Provider(s): [facility] Reason for Treatment: MH issues  Prior Outpatient Therapy Prior Outpatient Therapy: Yes Prior Therapy Dates: 2020 Prior Therapy Facilty/Provider(s): [facility] Reason for Treatment: Med mang Does patient have an ACCT team?: No Does patient have Intensive In-House Services?  : No Does patient have [facility] services? : No Does patient have P4CC services?: No  ADL Screening (condition at time of admission) Patient's cognitive ability adequate to safely complete daily activities?: Yes Is the patient deaf or have difficulty hearing?:  No Does the patient have difficulty seeing, even when wearing glasses/contacts?: No Does the patient have  difficulty concentrating, remembering, or making decisions?: No Patient able to express need for assistance with ADLs?: Yes Does the patient have difficulty dressing or bathing?: No Independently performs ADLs?: Yes (appropriate for developmental age) Does the patient have difficulty walking or climbing stairs?: No Weakness of Legs: None Weakness of Arms/Hands: None  Home Assistive Devices/Equipment Home Assistive Devices/Equipment: None  Therapy Consults (therapy consults require a physician order) PT Evaluation Needed: No OT Evalulation Needed: No SLP Evaluation Needed: No Abuse/Neglect Assessment (Assessment to be complete while patient is alone) Abuse/Neglect Assessment Can Be Completed: Yes Physical Abuse: Denies Verbal Abuse: Denies Sexual Abuse: Denies Exploitation of patient/patient's resources: Denies Self-Neglect: Denies Values / Beliefs Cultural Requests During Hospitalization: None Spiritual Requests During Hospitalization: None Consults Spiritual Care Consult Needed: No Transition of Care Team Consult Needed: No Advance Directives (For Healthcare) Does Patient Have a Medical Advance Directive?: No Would patient like information on creating a medical advance directive?: No - Patient declined          Disposition: Per Rankin NP patient does not meet inpatient criteria and will be provided with OP resources on discharge.   Disposition Initial Assessment Completed for this Encounter: Yes Disposition of Patient: Discharge  On Site Evaluation by:   Reviewed with Physician:    Mamie Nick 01/02/2020 12:52 PM

## 2020-01-02 NOTE — ED Notes (Signed)
Pt states " I want to leave this place I don't have time for this"

## 2020-01-02 NOTE — Patient Outreach (Signed)
ED Peer Support Specialist Patient Intake (Complete at intake & 30-60 Day Follow-up)  Name: [name]  MRN: [id number]  Age: 59 y.o.   Date of Admission: 01/02/2020  Intake: Initial Comments:      Primary Reason Admitted:  Suicidal   Abdominal Pain   Hyperglycemia      Lab values: Alcohol/ETOH: Negative Positive UDS? Yes Amphetamines: No Barbiturates: No Benzodiazepines: No Cocaine: Yes Opiates: No Cannabinoids: No  Demographic information: Gender: Male Ethnicity: African American Marital Status: Single Insurance Status: Medicaid Ecologist (Work Neurosurgeon, Physicist, medical, Social research officer, government.: Yes(SSI) Lives with: Alone Living situation: Homeless shelter  Reported Patient History: Patient reported health conditions: Schizophrenia, Bipolar disorder, Depression Patient aware of HIV and hepatitis status: No  In past year, has patient visited ED for any reason? No  Number of ED visits:    Reason(s) for visit:    In past year, has patient been hospitalized for any reason? No  Number of hospitalizations:    Reason(s) for hospitalization:    In past year, has patient been arrested? No  Number of arrests:    Reason(s) for arrest:    In past year, has patient been incarcerated? No  Number of incarcerations:    Reason(s) for incarceration:    In past year, has patient received medication-assisted treatment? No  In past year, patient received the following treatments:    In past year, has patient received any harm reduction services? No  Did this include any of the following?    In past year, has patient received care from a mental health provider for diagnosis other than SUD? Yes  In past year, is this first time patient has overdosed? No  Number of past overdoses:    In past year, is this first time patient has been hospitalized for an overdose? No  Number of hospitalizations for overdose(s):    Is patient currently receiving  treatment for a mental health diagnosis? No  Patient reports experiencing difficulty participating in SUD treatment: No    Most important reason(s) for this difficulty?    Has patient received prior services for treatment? No  In past, patient has received services from following agencies:    Plan of Care:  Suggested follow up at these agencies/treatment centers: [facility] Treatment Team)  Other information: CPSS met with Pt an was able to complete series of question to better assist Pt. CPSS was made aware that he has been feeling as if he wanted to harm himself for a few weeks. Pt stated that he is homeless an that he wants to better the quality of his life. Pt signed a consent form for CPSS to fax out his information to [facility].    Aaron Edelman Asheton Viramontes, CPSS  01/02/2020 1:34 PM

## 2020-01-02 NOTE — ED Notes (Signed)
Peer support working on medications.

## 2020-01-02 NOTE — ED Notes (Signed)
Peer support at bedside. Pt on the phone with treatment center in [place] to see if accepted. Peer support plans to arrange transport

## 2020-01-02 NOTE — ED Notes (Signed)
Pt refused cbg monitoring

## 2020-01-02 NOTE — ED Provider Notes (Signed)
Medical screening examination/treatment/procedure(s) were performed by non-physician practitioner and as supervising physician I was immediately available for consultation/collaboration.     EKG Normal sinus rhythm rate 81 Normal ST-T waves No prior EKG for comparison   Linwood Dibbles, MD 01/03/20 [phone]

## 2020-01-02 NOTE — ED Notes (Signed)
Failed attempt to collect labs. RN notified °

## 2020-01-02 NOTE — ED Notes (Signed)
Consulted with peer support to clarify plan for DC.

## 2020-01-02 NOTE — Patient Outreach (Signed)
[place] [facility] contacted me an stated that they have a bed available an are willing to complete phone assessment. CPSS are waiting to here transportation plans for Pt.

## 2020-01-02 NOTE — ED Notes (Signed)
Wynona Canes, RN 2 failed attempts at lab draw; IVT consult ordered at this time.

## 2020-01-02 NOTE — ED Notes (Signed)
IV started no blood return for labs at this time.

## 2020-01-02 NOTE — ED Notes (Signed)
Pt verbalizes understanding of DC instructions. Pt belongings returned and is ambulatory out of ED.  

## 2020-01-02 NOTE — ED Notes (Signed)
Peer support at bedside 

## 2020-01-02 NOTE — BH Assessment (Signed)
BHH Assessment Progress Note  Per Shuvon Rankin, FNP, this pt does not require psychiatric hospitalization at this time.  Pt is to be discharged from [facility] with referral for [facility].  At 10:41 I called them and spoke to [place].  She has scheduled pt for an intake appointment on Tuesday, 01/10/2020 at 10:00 with Paige Cozart, LCSW.  This has been included in pt's discharge instructions along with information regarding area supportive services for the homeless.  Pt would also benefit from seeing Peer Support Specialists, and a peer support consult has been ordered for pt..  Pt's nurse has been notified.  Doylene Canning, MA Triage Specialist [phone]

## 2020-01-02 NOTE — Patient Outreach (Signed)
CPSS spoke with Pt about [facility] having a bed available for him tomorrow, Pt became frustrated an stated that he just wants to be discharged. CPSS explained that several places did not reply back for inpatient services. CPSS left contact information for Pt to follow up on tomorrow.

## 2020-01-02 NOTE — ED Notes (Signed)
Peer support confined pts transportation needs are ready. IV removed. All belongings returned

## 2020-01-02 NOTE — BHH Suicide Risk Assessment (Cosign Needed)
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 rate 16, height 5\' 9"  (1.753 m), weight 79.4 kg, SpO2 99 %.Body mass index is 25.84 kg/m.  General Appearance: Casual  Eye Contact::  Good  Speech:  Clear and Coherent and Normal Rate409  Volume:  Normal  Mood:  Depressed  Affect:  Congruent  Thought Process:  Coherent, Goal Directed and Descriptions of Associations: Intact  Orientation:  Full (Time, Place, and Person)  Thought Content:  WDL  Suicidal Thoughts:  Patient states that if he had a place to stay he would not feel suicidal  Homicidal Thoughts:  No  Memory:  Immediate;   Good Recent;   Good  Judgement:  Intact  Insight:  Fair and Present  Psychomotor Activity:  Normal  Concentration:  Good  Recall:  Good  Fund of Knowledge:Good  Language: Good  Akathisia:  No  Handed:  Right  AIMS (if indicated):     Assets:  Communication Skills Desire for Improvement Social Support  Sleep:     Cognition: WNL  ADL's:  Intact   Mental Status Per Nursing Assessment::   On Admission:    Josha Weekley, 59 y.o., male patient seen via tele psych by this provider, Dr. 46; and chart reviewed on 01/02/20.  On evaluation Mousa Prout reports he is depressed related to having thoughts of his sister that passed 3 months ago and his mother 5 months ago.  Patient states he is  in [place] because he came to visit a friend; and then states that he recently moved to [place] from [place] and he is homeless.  Patient states he would like to get help for his depression.  Patient states he has been admitted to a psychiatric hospital and last was 10 or 11 months ago.  States that he has not been compliant with his medications but would like to be restarted on a psychotropic medications (was unable to give the names of medication) but states they are not working.  Patient states that he uses cocaine but has not used in several months; when informed his UDS was positive patient admitted to recent use.  Patient states that he needs assistance with housing and if he had a place to stay he would be safe.  Patient gave permission to speak to his daughter for collateral information.   During evaluation Kyvon Hu is alert/oriented x 4; calm/cooperative; and mood is congruent with affect.  He does not appear to be responding to internal/external stimuli or delusional thoughts.  Patient denies homicidal ideation, psychosis, and paranoia.  He is stating that he is only suicidal without a place to stay and if we help him find a place to stay he would no longer feel suicidal.  Patient also made a statement about having a gun and thoughts of shooting himself.  States he no longer has the gun; that he gave it to his cousin to keep. Patient answered question appropriately.  Patient ongoing endorsement of suicidal ideation shows clear evidence of secondary gain of unmet needs for housing, that is representative of limited and often- maladaptive coping skills and rather than an indicator of imminent risk of death.  Evidence indicates that subsequent suicide attempts by patients who made contingent suicide threats (defined as threatened suicide or exaggerated suicidality) are uncommon in both groups.  Hospitalization should not be used as a substitute for social services, substance abuse  treatment, and legal assistance for patients who make contingent suicide threats (Characteristics and six-month outcome of patients who use suicide threats to seek hospital admission.  (1966). Psychiatric Services, 47(8), [phone]. (DOI: 10.1176/ps.47.8.871).    Collateral information:  Spoke to patients daughter Thurmon Mizell at [phone]).  Patient daughter states that patient is homeless and the main reason is his use of crack cocaine which is on and off.  States she is supportive of her father and saw him "the other day when I gave him a ride to [place]."  States that she was not aware that he was in [place].  States that he is unable to live with her because she doesn't have room and that [facility] is not a good place for him to live related to the crack cocaine use.  States she doesn't feel that he is a danger to himself and needs help mainly with his cocaine use.  States that patient is on disability.    Recommendation:  Ordered Peer Support to assist with halfway housing.  May be able to get patient housing at [place].  Also recommend outpatient psychiatric services.     Demographic Factors:  Male and Black  Loss Factors: Homeless  Historical Factors: Impulsivity  Risk Reduction Factors:   Sense of responsibility to family, Religious beliefs about death and Positive social support  Continued Clinical Symptoms:  Alcohol/Substance Abuse/Dependencies Previous Psychiatric Diagnoses and Treatments  Cognitive Features That Contribute To Risk:  None    Suicide Risk:  Minimal: No identifiable suicidal ideation.  Patients presenting with no risk factors but with morbid ruminations; may be classified as minimal risk based on the severity of the depressive symptoms   Plan Of Care/Follow-up recommendations:  Activity:  As tolerated Diet:  Heart healthy     Discharge Instructions     For your behavioral health needs, you are advised to follow up with [facility].  You have an intake appointment scheduled for Tuesday, January 10, 2020 at 10:00 am:       [facility]      [place], [place] [address]      [phone]  For your shelter needs, you are advised to contact [facility] at your earliest opportunity:       [facility]      [phone]  For other supportive services, contact the [place]:       [facility]      [place],  [address]      [phone]   Patient has a bed at [facility] (halfway house) is to report there tomorrow (01/03/2020)  Disposition:  Psychiatrically cleared to follow up with resources and outpatient services given No evidence of imminent risk to self or others at present.   Patient does not meet criteria for psychiatric inpatient admission. Supportive therapy provided about ongoing stressors. Discussed crisis plan, support from social network, calling  911, coming to the Emergency Department, and calling Suicide Hotline.  Lala Been, NP 01/02/2020, 3:52 PM

## 2020-01-02 NOTE — ED Notes (Signed)
Peer Support remains at bedside

## 2020-01-10 ENCOUNTER — Ambulatory Visit (HOSPITAL_COMMUNITY): Payer: Medicare Other | Admitting: Clinical

## 2020-04-04 ENCOUNTER — Emergency Department (HOSPITAL_COMMUNITY): Payer: Medicare Other

## 2020-04-04 ENCOUNTER — Other Ambulatory Visit: Payer: Self-pay

## 2020-04-04 ENCOUNTER — Emergency Department (HOSPITAL_COMMUNITY)
Admission: EM | Admit: 2020-04-04 | Discharge: 2020-04-04 | Disposition: A | Discharge status: Home / Self Care | Payer: Medicare Other | Source: Home / Self Care | Attending: Emergency Medicine | Admitting: Emergency Medicine

## 2020-04-04 ENCOUNTER — Encounter (HOSPITAL_COMMUNITY): Payer: Self-pay

## 2020-04-04 DIAGNOSIS — I6381 Other cerebral infarction due to occlusion or stenosis of small artery: Secondary | ICD-10-CM | POA: Diagnosis not present

## 2020-04-04 DIAGNOSIS — F1010 Alcohol abuse, uncomplicated: Secondary | ICD-10-CM

## 2020-04-04 DIAGNOSIS — R45851 Suicidal ideations: Secondary | ICD-10-CM | POA: Insufficient documentation

## 2020-04-04 DIAGNOSIS — E119 Type 2 diabetes mellitus without complications: Secondary | ICD-10-CM | POA: Insufficient documentation

## 2020-04-04 DIAGNOSIS — Z79899 Other long term (current) drug therapy: Secondary | ICD-10-CM | POA: Insufficient documentation

## 2020-04-04 DIAGNOSIS — Z20822 Contact with and (suspected) exposure to covid-19: Secondary | ICD-10-CM | POA: Insufficient documentation

## 2020-04-04 DIAGNOSIS — I10 Essential (primary) hypertension: Secondary | ICD-10-CM | POA: Insufficient documentation

## 2020-04-04 DIAGNOSIS — E1165 Type 2 diabetes mellitus with hyperglycemia: Secondary | ICD-10-CM

## 2020-04-04 DIAGNOSIS — Z794 Long term (current) use of insulin: Secondary | ICD-10-CM | POA: Insufficient documentation

## 2020-04-04 LAB — CBC
HCT: 38.6 % — ABNORMAL LOW (ref 39.0–52.0)
Hemoglobin: 12.8 g/dL — ABNORMAL LOW (ref 13.0–17.0)
MCH: 28.6 pg (ref 26.0–34.0)
MCHC: 33.2 g/dL (ref 30.0–36.0)
MCV: 86.2 fL (ref 80.0–100.0)
Platelets: 245 10*3/uL (ref 150–400)
RBC: 4.48 MIL/uL (ref 4.22–5.81)
RDW: 11.8 % (ref 11.5–15.5)
WBC: 11.2 10*3/uL — ABNORMAL HIGH (ref 4.0–10.5)
nRBC: 0 % (ref 0.0–0.2)

## 2020-04-04 LAB — BASIC METABOLIC PANEL
Anion gap: 15 (ref 5–15)
BUN: 22 mg/dL — ABNORMAL HIGH (ref 6–20)
CO2: 20 mmol/L — ABNORMAL LOW (ref 22–32)
Calcium: 10.2 mg/dL (ref 8.9–10.3)
Chloride: 101 mmol/L (ref 98–111)
Creatinine, Ser: 1.27 mg/dL — ABNORMAL HIGH (ref 0.61–1.24)
GFR calc Af Amer: >60 mL/min (ref >60)
GFR calc non Af Amer: >60 mL/min (ref >60)
Glucose, Bld: 397 mg/dL — ABNORMAL HIGH (ref 70–99)
Potassium: 4.5 mmol/L (ref 3.5–5.1)
Sodium: 136 mmol/L (ref 135–145)

## 2020-04-04 LAB — SALICYLATE LEVEL: Salicylate Lvl: <7 mg/dL — ABNORMAL LOW (ref 7.0–30.0)

## 2020-04-04 LAB — HEPATIC FUNCTION PANEL
ALT: 17 U/L (ref 0–44)
AST: 27 U/L (ref 15–41)
Albumin: 3.3 g/dL — ABNORMAL LOW (ref 3.5–5.0)
Alkaline Phosphatase: 57 U/L (ref 38–126)
Bilirubin, Direct: 0.1 mg/dL (ref 0.0–0.2)
Indirect Bilirubin: 1.3 mg/dL — ABNORMAL HIGH (ref 0.3–0.9)
Total Bilirubin: 1.4 mg/dL — ABNORMAL HIGH (ref 0.3–1.2)
Total Protein: 7.2 g/dL (ref 6.5–8.1)

## 2020-04-04 LAB — ETHANOL: Alcohol, Ethyl (B): <10 mg/dL (ref <10)

## 2020-04-04 LAB — CBG MONITORING, ED: Glucose-Capillary: 322 mg/dL — ABNORMAL HIGH (ref 70–99)

## 2020-04-04 LAB — SARS CORONAVIRUS 2 BY RT PCR (HOSPITAL ORDER, PERFORMED IN ~~LOC~~ HOSPITAL LAB): SARS Coronavirus 2: NEGATIVE

## 2020-04-04 LAB — ACETAMINOPHEN LEVEL: Acetaminophen (Tylenol), Serum: <10 ug/mL — ABNORMAL LOW (ref 10–30)

## 2020-04-04 MED ORDER — ONDANSETRON HCL 4 MG PO TABS: 4.0000 mg | ORAL_TABLET | Freq: Three times a day (TID) | ORAL | Status: DC | PRN

## 2020-04-04 MED ORDER — LORAZEPAM 1 MG PO TABS: 0.0000 mg | ORAL_TABLET | Freq: Two times a day (BID) | ORAL | Status: DC

## 2020-04-04 MED ORDER — ALUM & MAG HYDROXIDE-SIMETH 200-200-20 MG/5ML PO SUSP: 30.0000 mL | Freq: Four times a day (QID) | ORAL | Status: DC | PRN

## 2020-04-04 MED ORDER — METFORMIN HCL 500 MG PO TABS
1000.0000 mg | ORAL_TABLET | Freq: Two times a day (BID) | ORAL | Status: DC
  Administered 2020-04-04: 1000 mg via ORAL
  Filled 2020-04-04: qty 2

## 2020-04-04 MED ORDER — THIAMINE HCL 100 MG PO TABS
100.0000 mg | ORAL_TABLET | Freq: Every day | ORAL | Status: DC
  Administered 2020-04-04: 100 mg via ORAL
  Filled 2020-04-04: qty 1

## 2020-04-04 MED ORDER — INSULIN DETEMIR 100 UNIT/ML ~~LOC~~ SOLN
40.0000 [IU] | Freq: Every day | SUBCUTANEOUS | Status: DC
  Filled 2020-04-04: qty 0.4

## 2020-04-04 MED ORDER — SODIUM CHLORIDE 0.9 % IV BOLUS
1000.0000 mL | Freq: Once | INTRAVENOUS | Status: AC
  Administered 2020-04-04: 1000 mL via INTRAVENOUS

## 2020-04-04 MED ORDER — LORAZEPAM 2 MG/ML IJ SOLN: 0.0000 mg | Freq: Two times a day (BID) | INTRAMUSCULAR | Status: DC

## 2020-04-04 MED ORDER — NICOTINE 21 MG/24HR TD PT24
21.0000 mg | MEDICATED_PATCH | Freq: Every day | TRANSDERMAL | Status: DC
  Filled 2020-04-04: qty 1

## 2020-04-04 MED ORDER — LORAZEPAM 1 MG PO TABS
0.0000 mg | ORAL_TABLET | Freq: Four times a day (QID) | ORAL | Status: DC
  Administered 2020-04-04: 1 mg via ORAL
  Filled 2020-04-04: qty 1

## 2020-04-04 MED ORDER — THIAMINE HCL 100 MG/ML IJ SOLN: 100.0000 mg | Freq: Every day | INTRAMUSCULAR | Status: DC

## 2020-04-04 MED ORDER — GLIPIZIDE 10 MG PO TABS
10.0000 mg | ORAL_TABLET | Freq: Two times a day (BID) | ORAL | Status: DC
  Filled 2020-04-04: qty 1

## 2020-04-04 MED ORDER — LORAZEPAM 2 MG/ML IJ SOLN: 0.0000 mg | Freq: Four times a day (QID) | INTRAMUSCULAR | Status: DC

## 2020-04-04 NOTE — ED Notes (Signed)
Patient Alert and oriented to baseline. Stable and ambulatory to baseline. Patient verbalized understanding of the discharge instructions.  Patient belongings were taken by the patient.   

## 2020-04-04 NOTE — ED Provider Notes (Signed)
[facility] EMERGENCY DEPARTMENT Provider Note   CSN: [id number] Arrival date & time: 04/04/20  9485     History Chief Complaint  Patient presents with  . Suicidal    [name] is a 59 y.o. male.  Pt presents to the ED today by EMS [place].  Pt said he was traveling from [place] to get treatment for alcohol abuse.  He said he was feeling suicidal and wanted to jump in front of the train.  He has a hx of DM and said he takes his medicine.  However, he has multiple ED visits for hyperglycemia.  His hgb a1c on 9/1 was over 14.  He said he has his medicine and last took it yesterday.  Pt said he last drank a pint of alcohol yesterday.  He uses cocaine sometimes, but said he has not used it in awhile.  He said he's been vaccinated against Covid.        Past Medical History:  Diagnosis Date  . Anxiety   . Bipolar affective disorder (HCC)   . Bipolar disorder (HCC) 03/11/2012  . Depression   . Diabetes mellitus   . Diabetes mellitus (HCC) 03/11/2012  . Hepatitis 06/30/2013   Type C  . Hypertension   . Schizophrenia, schizo-affective [facility])     Patient Active Problem List   Diagnosis Date Noted  . Cocaine use disorder (HCC) 01/02/2020  . 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 Homelessness 01/02/2020  . Cocaine use disorder, moderate, dependence (HCC) 10/29/2016  . DKA (diabetic ketoacidoses) (HCC) 02/28/2016  . Chest pain 02/28/2016  . AKI (acute kidney injury) (HCC) 02/28/2016  . Suicidal ideation 02/06/2016  . Cannabis use disorder, moderate, dependence (HCC) 02/06/2016  . Schizoaffective disorder, bipolar type (HCC) 02/05/2016  . Hyperlipidemia 01/18/2015  . Hyperprolactinemia (HCC) 01/18/2015  . Diabetes mellitus (HCC) 03/11/2012  . HTN (hypertension) 03/11/2012    Past Surgical History:  Procedure Laterality Date  . FINGER SURGERY    . WISDOM TOOTH EXTRACTION         Family  History  Problem Relation Age of Onset  . Diabetes Mother   . Bipolar disorder Sister     Social History   Tobacco Use  . Smoking status: Never Smoker  . Smokeless tobacco: Never Used  Substance Use Topics  . Alcohol use: No  . Drug use: Yes    Types: Marijuana, Cocaine    Comment: 4 days ago last THC and cocoaine use... relapsed after 2 yrs     Home Medications Prior to Admission medications   Medication Sig Start Date End Date Taking? Authorizing Provider  glipiZIDE (GLUCOTROL) 10 MG tablet Take 1 tablet (10 mg total) by mouth 2 (two) times daily before a meal. 01/02/20  Yes Terrilee Files, MD  HUMALOG KWIKPEN 100 UNIT/ML KwikPen Inject 0-0.1 mLs (0-10 Units total) into the skin 3 (three) times daily with meals. 01/02/20  Yes Terrilee Files, MD  insulin detemir (LEVEMIR) 100 UNIT/ML injection Inject 0.5 mLs (50 Units total) into the skin at bedtime. Schedule of insulin is prescribed as it was administered in the hospital.  And will be further managed by your outpatient provider. Patient taking differently: Inject 40 Units into the skin at bedtime. Schedule of insulin is prescribed as it was administered in the hospital.  And will be further managed by your outpatient provider. 01/02/20  Yes Terrilee Files, MD  metFORMIN (GLUCOPHAGE) 1000  MG tablet Take 1 tablet (1,000 mg total) by mouth 2 (two) times daily with a meal. 01/02/20  Yes Terrilee Files, MD  ARIPiprazole (ABILIFY) 15 MG tablet Take 1 tablet (15 mg total) by mouth daily. Patient not taking: Reported on 04/04/2020 01/02/20   Terrilee Files, MD  FLUoxetine (PROZAC) 40 MG capsule Take 1 capsule (40 mg total) by mouth daily. Patient not taking: Reported on 04/04/2020 01/02/20   Terrilee Files, MD  hydrOXYzine (ATARAX/VISTARIL) 25 MG tablet Take 1 tablet (25 mg total) by mouth 3 (three) times daily as needed for anxiety. Patient not taking: Reported on 04/04/2020 01/02/20   Terrilee Files, MD  QUEtiapine (SEROQUEL) 200 MG  tablet Take 1 tablet (200 mg total) by mouth at bedtime. Patient not taking: Reported on 04/04/2020 01/02/20 02/01/20  Terrilee Files, MD  zolpidem (AMBIEN) 5 MG tablet Take 1 tablet (5 mg total) by mouth at bedtime as needed for sleep. Patient not taking: Reported on 04/04/2020 01/02/20   Terrilee Files, MD    Allergies    Wellbutrin [bupropion]  Review of Systems   Review of Systems  Psychiatric/Behavioral: Positive for suicidal ideas.  All other systems reviewed and are negative.   Physical Exam Updated Vital Signs BP 124/71   Pulse 79   Temp 99 F (37.2 C) (Oral)   Resp 15   SpO2 100%   Physical Exam Vitals and nursing note reviewed.  Constitutional:      Appearance: Normal appearance.  HENT:     Head: Normocephalic and atraumatic.     Right Ear: External ear normal.     Left Ear: External ear normal.     Nose: Nose normal.     Mouth/Throat:     Mouth: Mucous membranes are dry.  Eyes:     Extraocular Movements: Extraocular movements intact.     Conjunctiva/sclera: Conjunctivae normal.     Pupils: Pupils are equal, round, and reactive to light.  Cardiovascular:     Rate and Rhythm: Normal rate and regular rhythm.     Pulses: Normal pulses.     Heart sounds: Normal heart sounds.  Pulmonary:     Effort: Pulmonary effort is normal.     Breath sounds: Normal breath sounds.  Abdominal:     General: Abdomen is flat. Bowel sounds are normal.     Palpations: Abdomen is soft.  Musculoskeletal:        General: Normal range of motion.     Cervical back: Normal range of motion and neck supple.  Skin:    General: Skin is warm.     Capillary Refill: Capillary refill takes less than 2 seconds.  Neurological:     Mental Status: He is alert and oriented to person, place, and time.     Comments: Chronic left sided weakness from a prior stroke.  Psychiatric:        Thought Content: Thought content includes suicidal ideation. Thought content includes suicidal plan.     ED  Results / Procedures / Treatments   Labs (all labs ordered are listed, but only abnormal results are displayed) Labs Reviewed  BASIC METABOLIC PANEL - Abnormal; Notable for the following components:      Result Value   CO2 20 (*)    Glucose, Bld 397 (*)    BUN 22 (*)    Creatinine, Ser 1.27 (*)    All other components within normal limits  CBC - Abnormal; Notable for the following components:  WBC 11.2 (*)    Hemoglobin 12.8 (*)    HCT 38.6 (*)    All other components within normal limits  HEPATIC FUNCTION PANEL - Abnormal; Notable for the following components:   Albumin 3.3 (*)    Total Bilirubin 1.4 (*)    Indirect Bilirubin 1.3 (*)    All other components within normal limits  SALICYLATE LEVEL - Abnormal; Notable for the following components:   Salicylate Lvl <7.0 (*)    All other components within normal limits  ACETAMINOPHEN LEVEL - Abnormal; Notable for the following components:   Acetaminophen (Tylenol), Serum <10 (*)    All other components within normal limits  CBG MONITORING, ED - Abnormal; Notable for the following components:   Glucose-Capillary 322 (*)    All other components within normal limits  SARS CORONAVIRUS 2 BY RT PCR (HOSPITAL ORDER, PERFORMED IN [facility])  ETHANOL  URINALYSIS, ROUTINE W REFLEX MICROSCOPIC  RAPID URINE DRUG SCREEN, HOSP PERFORMED    EKG EKG Interpretation  Date/Time:  Wednesday April 04 2020 07:25:18 EDT Ventricular Rate:  91 PR Interval:  168 QRS Duration: 84 QT Interval:  362 QTC Calculation: 445 R Axis:   57 Text Interpretation: Normal sinus rhythm ST & T wave abnormality, consider lateral ischemia Abnormal ECG t wave inversions have resolved Confirmed by Jacalyn LefevreHaviland, Shelia Kingsberry [phone]([id number]) on 04/04/2020 10:14:42 AM   Radiology DG Chest 2 View  Result Date: 04/04/2020 CLINICAL DATA:  Hyperglycemic, suicidal thoughts X 3 days. History of hypertension, diabetes, schizoaffective and bipolar disorder, hepatitis. EXAM:  CHEST - 2 VIEW COMPARISON:  Chest x-ray 02/28/2016. chest x-ray 02/03/2013 FINDINGS: The heart size and mediastinal contours are within normal limits. Both lungs are clear. No acute osseous finding. Multilevel degenerative changes of the spine with chronic mild anterior wedge shaped compression deformity of a midthoracic body IMPRESSION: No active cardiopulmonary disease. Electronically Signed   By: Tish FredericksonMorgane  Naveau M.D.   On: 04/04/2020 10:48    Procedures Procedures (including critical care time)  Medications Ordered in ED Medications  glipiZIDE (GLUCOTROL) tablet 10 mg (has no administration in time range)  insulin detemir (LEVEMIR) injection 40 Units (has no administration in time range)  metFORMIN (GLUCOPHAGE) tablet 1,000 mg (has no administration in time range)  LORazepam (ATIVAN) injection 0-4 mg (has no administration in time range)    Or  LORazepam (ATIVAN) tablet 0-4 mg (has no administration in time range)  LORazepam (ATIVAN) injection 0-4 mg (has no administration in time range)    Or  LORazepam (ATIVAN) tablet 0-4 mg (has no administration in time range)  thiamine tablet 100 mg (has no administration in time range)    Or  thiamine (B-1) injection 100 mg (has no administration in time range)  alum & mag hydroxide-simeth (MAALOX/MYLANTA) 200-200-20 MG/5ML suspension 30 mL (has no administration in time range)  ondansetron (ZOFRAN) tablet 4 mg (has no administration in time range)  nicotine (NICODERM CQ - dosed in mg/24 hours) patch 21 mg (has no administration in time range)  sodium chloride 0.9 % bolus 1,000 mL (1,000 mLs Intravenous New Bag/Given 04/04/20 1122)    ED Course  I have reviewed the triage vital signs and the nursing notes.  Pertinent labs & imaging results that were available during my care of the patient were reviewed by me and considered in my medical decision making (see chart for details).    MDM Rules/Calculators/A&P  Pt started  back on his diabetic medications while in the ED.  Carb modified diet ordered.  TTS consult with CIWA protocol ordered to address the SI.   Pt is stable for TTS eval.  TTS evaluated patient and feel he can go home.  He does not want to go home because he is homeless.  Resources given to patient.  I think pt is saying he is suicidal for secondary gain.  Pt said he has his meds.  He is stable for d/c.  Return if worse.  Final Clinical Impression(s) / ED Diagnoses Final diagnoses:  Poorly controlled type 2 diabetes mellitus (HCC)  Suicidal ideation  Alcohol abuse    Rx / DC Orders ED Discharge Orders    None       Jacalyn Lefevre, MD 04/04/20 1414

## 2020-04-04 NOTE — Discharge Instructions (Addendum)
[facility] * (Intensive outpatient, partial hospitalization, individual therapy, and medication management) [address], [address] [place], [place] [address] [phone] [facility] * (medication management and therapy) [address] [place], [place] [address] [phone] [facility] (Therapy only)* (takes [place] and most major insurances) [place]:  [address] [facility] [place], [place]  Phone: [phone] [place]:  [address], [place], [place] [address]   Phone: [phone] [place]:  [address] [place], [place] [address]  Phone: [phone] [facility]  (specializes in trauma therapy, substance abuse and mood disorders. Takes patients without insurance for little to no cost out of pocket. Also takes most major insurances) [address]  [place], [place] [address] [phone] [facility] * (therapy and medication management) [address] [place], [place] [address] [phone] [facility] (Medication Management) [address] [place], [place] [address] [phone] [facility] * (therapy only) [address] [place], [place] [address] [phone] [facility] [address] [place] [address] [phone] [facility] * [address] [place], [place] [address] [phone] [facility]* (accepts non-insured)  [address] [place], [place] [address] [phone] Walk in Monday-Friday 8am-3pm [facility]* (takes sliding scale and Medicaid as well as other major insurances)  [place]:   [address], [place]  Phone: [phone] [place]:   [facility] [address] [place], [place]  Phone: [phone] Walk in Monday-Friday 830am-230pm [facility]* (accepts [employer] non-insured) [address] [place], [place] [address] [phone] Walk in Monday-Friday 8am-5pm

## 2020-04-04 NOTE — BHH Counselor (Signed)
Disposition: Per Denzil Magnuson, PMHNP this patient does not meet in patient criteria and is psych cleared. CSW to provide patient with resources.  Crystal, RN notified.

## 2020-04-04 NOTE — Progress Notes (Signed)
Patient ID: [name], male   DOB: 19-Mar-1961, 59 y.o.   MRN: [id number]   I spoke to [name] via tele psych. [name], is well known to the behavioral health system as he has numerous psychiatric evaluations. He has a chronic  psychiatric history is significant for cocaine use with cocaine-induced mood disorder and Schizoaffective disorder, bipolar type along with malingering that has showed evidence of secondary gain due to homelessness. He commonly expresses or threatens suicide and most often, his reports of suicide are conflicting. Per review of chart, he presented to the ED by EMS after being found lying by the train tracks and he stated that he he was traveling from [place] to get treatment for alcohol abuse. PER EDP note he also stated that he was feeling suicidal and wanted to jump in front of the train although he told the ED nurse that he was coming from [place] to get treatment for alcohol and drug use and he was unsure why he was on ground. He was  found to be hyperglycemic which he has a significant history of hyperglycemia secondary to uncontrolled Diabetes along with syncopal episodes. .  During this evaluation, patient was alert and oriented x4, calm and cooperative. His stories remained conflicting as to if he was suicidal or not. Per TTS counselor patient stated that he was suicidal and going to walk in front of a train however, she stated patient reported once he fell  Before getting ot the train tracks, he yelled and asked for help. As I spoke to [name] during the assessment, he stated," I am tired of everything and I don't want to be here" but he only requested rehab for his chronic cocaine addiction. Per chart view, patient was recently discharged from [facility] and per patient, he completed the program. Per ongoing chart review,it was noted at least twice that patient presented to [facility] 03/13/2020 following depression,  suicidal thoughts and cocaine use disorder (subsequently discharged to [facility]) then again on 03/28/2020 coming from [facility] with syncopal episode due to uncontrolled DM. He acknowledged these admission but added," [facility]. I didn't like how they treated me at [facility]." Patient has been provided with a number of resources to include [facility] (halfway house) which he was suppose  to report to on (01/03/2020). Per patient, he was not told anything about [facility] yet, this was recommended and  documented by [facility] provider . Patient denied HI during this evaluation. He inconsistently reported hallucinations per counselors report however, as I observed him, he did not appear to be internally preoccupied. Based off of my evaluation, there is no evidence of imminent risk to self or others at present.  Patient does not meet criteria for psychiatric inpatient admission and he is therefore, psychiatrically cleared. Resources were faxed to the ED to be provided to patient for housing, mental health and substance abuse needs. Patient encouraged to follow up with resources for outpatient services to assist with meeting his needs   ED updated on disposition.

## 2020-04-04 NOTE — ED Notes (Signed)
Pt spilled lunch tray all over himself and bed. Bed linens changed.

## 2020-04-04 NOTE — ED Triage Notes (Addendum)
Patient arrived by [facility] after being found laying by train tracks. Patient states that he was coming from [place] to get treatment for alcohol and drug use. Patient alert and oriented, denies injury. Patient with residual weakness from previous stroke and unsure why he was on ground. Patient also found to be hyperglycemic
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
## 2020-04-04 NOTE — Progress Notes (Signed)
Pt has been psychiatrically cleared. Outpatient MH/SU resources have been attached to pt's AVS.   Wells Guiles, MSW, LCSW, LCAS Clinical Social Worker II Disposition CSW [phone]

## 2020-04-05 ENCOUNTER — Other Ambulatory Visit: Payer: Self-pay

## 2020-04-05 ENCOUNTER — Inpatient Hospital Stay (HOSPITAL_COMMUNITY)
Admission: EM | Admit: 2020-04-05 | Discharge: 2020-04-18 | DRG: 065 | Disposition: A | Discharge status: Home / Self Care | Payer: Medicare Other | Attending: Internal Medicine | Admitting: Internal Medicine

## 2020-04-05 ENCOUNTER — Encounter (HOSPITAL_COMMUNITY): Payer: Self-pay

## 2020-04-05 ENCOUNTER — Emergency Department (HOSPITAL_COMMUNITY): Payer: Medicare Other

## 2020-04-05 DIAGNOSIS — Z79899 Other long term (current) drug therapy: Secondary | ICD-10-CM

## 2020-04-05 DIAGNOSIS — G8194 Hemiplegia, unspecified affecting left nondominant side: Secondary | ICD-10-CM | POA: Diagnosis present

## 2020-04-05 DIAGNOSIS — I6381 Other cerebral infarction due to occlusion or stenosis of small artery: Principal | ICD-10-CM | POA: Diagnosis present

## 2020-04-05 DIAGNOSIS — Z818 Family history of other mental and behavioral disorders: Secondary | ICD-10-CM

## 2020-04-05 DIAGNOSIS — I313 Pericardial effusion (noninflammatory): Secondary | ICD-10-CM | POA: Diagnosis not present

## 2020-04-05 DIAGNOSIS — F1010 Alcohol abuse, uncomplicated: Secondary | ICD-10-CM | POA: Diagnosis present

## 2020-04-05 DIAGNOSIS — E1165 Type 2 diabetes mellitus with hyperglycemia: Secondary | ICD-10-CM | POA: Diagnosis present

## 2020-04-05 DIAGNOSIS — Z888 Allergy status to other drugs, medicaments and biological substances status: Secondary | ICD-10-CM | POA: Diagnosis not present

## 2020-04-05 DIAGNOSIS — I10 Essential (primary) hypertension: Secondary | ICD-10-CM | POA: Diagnosis present

## 2020-04-05 DIAGNOSIS — F1494 Cocaine use, unspecified with cocaine-induced mood disorder: Secondary | ICD-10-CM | POA: Diagnosis present

## 2020-04-05 DIAGNOSIS — E785 Hyperlipidemia, unspecified: Secondary | ICD-10-CM | POA: Diagnosis present

## 2020-04-05 DIAGNOSIS — Z833 Family history of diabetes mellitus: Secondary | ICD-10-CM

## 2020-04-05 DIAGNOSIS — Z20822 Contact with and (suspected) exposure to covid-19: Secondary | ICD-10-CM | POA: Diagnosis present

## 2020-04-05 DIAGNOSIS — F250 Schizoaffective disorder, bipolar type: Secondary | ICD-10-CM | POA: Diagnosis present

## 2020-04-05 DIAGNOSIS — E119 Type 2 diabetes mellitus without complications: Secondary | ICD-10-CM

## 2020-04-05 DIAGNOSIS — R45851 Suicidal ideations: Secondary | ICD-10-CM | POA: Diagnosis present

## 2020-04-05 DIAGNOSIS — R29701 NIHSS score 1: Secondary | ICD-10-CM | POA: Diagnosis present

## 2020-04-05 DIAGNOSIS — W19XXXA Unspecified fall, initial encounter: Secondary | ICD-10-CM

## 2020-04-05 DIAGNOSIS — R4781 Slurred speech: Secondary | ICD-10-CM | POA: Diagnosis present

## 2020-04-05 DIAGNOSIS — I639 Cerebral infarction, unspecified: Secondary | ICD-10-CM | POA: Diagnosis not present

## 2020-04-05 DIAGNOSIS — M79675 Pain in left toe(s): Secondary | ICD-10-CM

## 2020-04-05 DIAGNOSIS — Z8619 Personal history of other infectious and parasitic diseases: Secondary | ICD-10-CM | POA: Diagnosis not present

## 2020-04-05 DIAGNOSIS — Z794 Long term (current) use of insulin: Secondary | ICD-10-CM

## 2020-04-05 DIAGNOSIS — R296 Repeated falls: Secondary | ICD-10-CM | POA: Diagnosis not present

## 2020-04-05 DIAGNOSIS — F1414 Cocaine abuse with cocaine-induced mood disorder: Secondary | ICD-10-CM | POA: Diagnosis present

## 2020-04-05 DIAGNOSIS — E1169 Type 2 diabetes mellitus with other specified complication: Secondary | ICD-10-CM | POA: Diagnosis not present

## 2020-04-05 LAB — URINALYSIS, ROUTINE W REFLEX MICROSCOPIC
Bacteria, UA: NONE SEEN
Bilirubin Urine: NEGATIVE
Glucose, UA: >=500 mg/dL — AB
Hgb urine dipstick: NEGATIVE
Ketones, ur: NEGATIVE mg/dL
Leukocytes,Ua: NEGATIVE
Nitrite: NEGATIVE
Protein, ur: NEGATIVE mg/dL
Specific Gravity, Urine: 1.02 (ref 1.005–1.030)
pH: 5 (ref 5.0–8.0)

## 2020-04-05 LAB — HEMOGLOBIN A1C
Hgb A1c MFr Bld: 14.2 % — ABNORMAL HIGH (ref 4.8–5.6)
Mean Plasma Glucose: 360.84 mg/dL

## 2020-04-05 LAB — LIPID PANEL
Cholesterol: 254 mg/dL — ABNORMAL HIGH (ref 0–200)
HDL: 66 mg/dL (ref >40)
LDL Cholesterol: 171 mg/dL — ABNORMAL HIGH (ref 0–99)
Total CHOL/HDL Ratio: 3.8 RATIO
Triglycerides: 85 mg/dL (ref <150)
VLDL: 17 mg/dL (ref 0–40)

## 2020-04-05 LAB — CBC WITH DIFFERENTIAL/PLATELET
Abs Immature Granulocytes: 0.01 10*3/uL (ref 0.00–0.07)
Basophils Absolute: 0 10*3/uL (ref 0.0–0.1)
Basophils Relative: 0 %
Eosinophils Absolute: 0 10*3/uL (ref 0.0–0.5)
Eosinophils Relative: 1 %
HCT: 41.2 % (ref 39.0–52.0)
Hemoglobin: 13 g/dL (ref 13.0–17.0)
Immature Granulocytes: 0 %
Lymphocytes Relative: 24 %
Lymphs Abs: 2 10*3/uL (ref 0.7–4.0)
MCH: 28.5 pg (ref 26.0–34.0)
MCHC: 31.6 g/dL (ref 30.0–36.0)
MCV: 90.4 fL (ref 80.0–100.0)
Monocytes Absolute: 0.6 10*3/uL (ref 0.1–1.0)
Monocytes Relative: 7 %
Neutro Abs: 5.5 10*3/uL (ref 1.7–7.7)
Neutrophils Relative %: 68 %
Platelets: 204 10*3/uL (ref 150–400)
RBC: 4.56 MIL/uL (ref 4.22–5.81)
RDW: 12 % (ref 11.5–15.5)
WBC: 8.2 10*3/uL (ref 4.0–10.5)
nRBC: 0 % (ref 0.0–0.2)

## 2020-04-05 LAB — RAPID URINE DRUG SCREEN, HOSP PERFORMED
Amphetamines: NOT DETECTED
Barbiturates: NOT DETECTED
Benzodiazepines: NOT DETECTED
Cocaine: POSITIVE — AB
Opiates: NOT DETECTED
Tetrahydrocannabinol: NOT DETECTED

## 2020-04-05 LAB — CBG MONITORING, ED: Glucose-Capillary: 377 mg/dL — ABNORMAL HIGH (ref 70–99)

## 2020-04-05 LAB — COMPREHENSIVE METABOLIC PANEL
ALT: 24 U/L (ref 0–44)
AST: 50 U/L — ABNORMAL HIGH (ref 15–41)
Albumin: 3.8 g/dL (ref 3.5–5.0)
Alkaline Phosphatase: 65 U/L (ref 38–126)
Anion gap: 13 (ref 5–15)
BUN: 25 mg/dL — ABNORMAL HIGH (ref 6–20)
CO2: 23 mmol/L (ref 22–32)
Calcium: 9.5 mg/dL (ref 8.9–10.3)
Chloride: 99 mmol/L (ref 98–111)
Creatinine, Ser: 1.07 mg/dL (ref 0.61–1.24)
GFR calc Af Amer: >60 mL/min (ref >60)
GFR calc non Af Amer: >60 mL/min (ref >60)
Glucose, Bld: 170 mg/dL — ABNORMAL HIGH (ref 70–99)
Potassium: 4.2 mmol/L (ref 3.5–5.1)
Sodium: 135 mmol/L (ref 135–145)
Total Bilirubin: 1.1 mg/dL (ref 0.3–1.2)
Total Protein: 7.7 g/dL (ref 6.5–8.1)

## 2020-04-05 LAB — ETHANOL: Alcohol, Ethyl (B): <10 mg/dL (ref <10)

## 2020-04-05 MED ORDER — HYDROCODONE-ACETAMINOPHEN 5-325 MG PO TABS
1.0000 | ORAL_TABLET | Freq: Once | ORAL | Status: AC
  Administered 2020-04-05: 1 via ORAL
  Filled 2020-04-05: qty 1

## 2020-04-05 MED ORDER — ACETAMINOPHEN 650 MG RE SUPP: 650.0000 mg | RECTAL | Status: DC | PRN

## 2020-04-05 MED ORDER — ACETAMINOPHEN 325 MG PO TABS
650.0000 mg | ORAL_TABLET | ORAL | Status: DC | PRN
  Administered 2020-04-06 – 2020-04-08 (×4): 650 mg via ORAL
  Filled 2020-04-05 (×4): qty 2

## 2020-04-05 MED ORDER — INSULIN DETEMIR 100 UNIT/ML ~~LOC~~ SOLN
40.0000 [IU] | Freq: Every day | SUBCUTANEOUS | Status: DC
  Administered 2020-04-05 – 2020-04-09 (×5): 40 [IU] via SUBCUTANEOUS
  Filled 2020-04-05 (×5): qty 0.4

## 2020-04-05 MED ORDER — ATORVASTATIN CALCIUM 40 MG PO TABS
80.0000 mg | ORAL_TABLET | Freq: Every day | ORAL | Status: DC
  Administered 2020-04-05 – 2020-04-18 (×14): 80 mg via ORAL
  Filled 2020-04-05 (×3): qty 2
  Filled 2020-04-05: qty 4
  Filled 2020-04-05 (×10): qty 2

## 2020-04-05 MED ORDER — LORAZEPAM 2 MG/ML IJ SOLN: 1.0000 mg | INTRAMUSCULAR | Status: AC | PRN

## 2020-04-05 MED ORDER — INSULIN ASPART 100 UNIT/ML ~~LOC~~ SOLN
0.0000 [IU] | Freq: Three times a day (TID) | SUBCUTANEOUS | Status: DC
  Administered 2020-04-06 – 2020-04-07 (×2): 2 [IU] via SUBCUTANEOUS
  Administered 2020-04-07: 5 [IU] via SUBCUTANEOUS
  Administered 2020-04-07: 8 [IU] via SUBCUTANEOUS
  Administered 2020-04-08: 3 [IU] via SUBCUTANEOUS
  Administered 2020-04-08: 5 [IU] via SUBCUTANEOUS
  Administered 2020-04-08: 11 [IU] via SUBCUTANEOUS
  Administered 2020-04-09: 8 [IU] via SUBCUTANEOUS
  Administered 2020-04-09: 2 [IU] via SUBCUTANEOUS
  Administered 2020-04-10: 11 [IU] via SUBCUTANEOUS
  Administered 2020-04-10: 3 [IU] via SUBCUTANEOUS
  Administered 2020-04-11 (×2): 5 [IU] via SUBCUTANEOUS
  Administered 2020-04-11 – 2020-04-12 (×2): 2 [IU] via SUBCUTANEOUS
  Administered 2020-04-12: 5 [IU] via SUBCUTANEOUS
  Administered 2020-04-13: 3 [IU] via SUBCUTANEOUS
  Administered 2020-04-13: 5 [IU] via SUBCUTANEOUS
  Administered 2020-04-13: 11 [IU] via SUBCUTANEOUS
  Administered 2020-04-14: 5 [IU] via SUBCUTANEOUS
  Administered 2020-04-14: 8 [IU] via SUBCUTANEOUS
  Administered 2020-04-14: 5 [IU] via SUBCUTANEOUS
  Administered 2020-04-15 (×2): 3 [IU] via SUBCUTANEOUS
  Administered 2020-04-15: 5 [IU] via SUBCUTANEOUS
  Administered 2020-04-16: 3 [IU] via SUBCUTANEOUS
  Administered 2020-04-16 – 2020-04-17 (×3): 2 [IU] via SUBCUTANEOUS
  Administered 2020-04-17: 5 [IU] via SUBCUTANEOUS
  Administered 2020-04-18: 3 [IU] via SUBCUTANEOUS
  Filled 2020-04-05: qty 0.15

## 2020-04-05 MED ORDER — ASPIRIN 325 MG PO TABS
325.0000 mg | ORAL_TABLET | Freq: Every day | ORAL | Status: DC
  Administered 2020-04-05 – 2020-04-18 (×14): 325 mg via ORAL
  Filled 2020-04-05 (×14): qty 1

## 2020-04-05 MED ORDER — FOLIC ACID 1 MG PO TABS
1.0000 mg | ORAL_TABLET | Freq: Every day | ORAL | Status: DC
  Administered 2020-04-06 – 2020-04-18 (×13): 1 mg via ORAL
  Filled 2020-04-05 (×13): qty 1

## 2020-04-05 MED ORDER — SENNOSIDES-DOCUSATE SODIUM 8.6-50 MG PO TABS: 1.0000 | ORAL_TABLET | Freq: Every evening | ORAL | Status: DC | PRN

## 2020-04-05 MED ORDER — SODIUM CHLORIDE 0.9 % IV SOLN: Freq: Once | INTRAVENOUS | Status: AC

## 2020-04-05 MED ORDER — THIAMINE HCL 100 MG PO TABS
100.0000 mg | ORAL_TABLET | Freq: Every day | ORAL | Status: DC
  Administered 2020-04-06 – 2020-04-18 (×13): 100 mg via ORAL
  Filled 2020-04-05 (×13): qty 1

## 2020-04-05 MED ORDER — ADULT MULTIVITAMIN W/MINERALS CH
1.0000 | ORAL_TABLET | Freq: Every day | ORAL | Status: DC
  Administered 2020-04-06 – 2020-04-18 (×13): 1 via ORAL
  Filled 2020-04-05 (×13): qty 1

## 2020-04-05 MED ORDER — ENOXAPARIN SODIUM 40 MG/0.4ML ~~LOC~~ SOLN
40.0000 mg | SUBCUTANEOUS | Status: DC
  Administered 2020-04-05 – 2020-04-17 (×13): 40 mg via SUBCUTANEOUS
  Filled 2020-04-05 (×16): qty 0.4

## 2020-04-05 MED ORDER — ASPIRIN 300 MG RE SUPP
300.0000 mg | Freq: Every day | RECTAL | Status: DC
  Filled 2020-04-05 (×14): qty 1

## 2020-04-05 MED ORDER — THIAMINE HCL 100 MG/ML IJ SOLN: 100.0000 mg | Freq: Every day | INTRAMUSCULAR | Status: DC

## 2020-04-05 MED ORDER — ACETAMINOPHEN 160 MG/5ML PO SOLN
650.0000 mg | ORAL | Status: DC | PRN
  Filled 2020-04-05: qty 20.3

## 2020-04-05 MED ORDER — INSULIN ASPART 100 UNIT/ML ~~LOC~~ SOLN
0.0000 [IU] | Freq: Every day | SUBCUTANEOUS | Status: DC
  Administered 2020-04-05: 5 [IU] via SUBCUTANEOUS
  Administered 2020-04-06: 4 [IU] via SUBCUTANEOUS
  Administered 2020-04-07 – 2020-04-08 (×2): 2 [IU] via SUBCUTANEOUS
  Administered 2020-04-09: 4 [IU] via SUBCUTANEOUS
  Administered 2020-04-10: 2 [IU] via SUBCUTANEOUS
  Administered 2020-04-11: 3 [IU] via SUBCUTANEOUS
  Administered 2020-04-12: 4 [IU] via SUBCUTANEOUS
  Administered 2020-04-14: 3 [IU] via SUBCUTANEOUS
  Administered 2020-04-15: 4 [IU] via SUBCUTANEOUS
  Administered 2020-04-16: 2 [IU] via SUBCUTANEOUS
  Administered 2020-04-17: 3 [IU] via SUBCUTANEOUS
  Filled 2020-04-05: qty 0.05

## 2020-04-05 MED ORDER — STROKE: EARLY STAGES OF RECOVERY BOOK
Freq: Once | Status: AC
  Filled 2020-04-05: qty 1

## 2020-04-05 MED ORDER — LORAZEPAM 1 MG PO TABS: 1.0000 mg | ORAL_TABLET | ORAL | Status: AC | PRN

## 2020-04-05 MED ORDER — SODIUM CHLORIDE 0.9 % IV BOLUS
1000.0000 mL | Freq: Once | INTRAVENOUS | Status: AC
  Administered 2020-04-05: 1000 mL via INTRAVENOUS

## 2020-04-05 NOTE — ED Notes (Signed)
Asked pt if I could check both arms for a vein but pt started getting upset so I just checked the left arm. Tried to draw blood on pt and was able to get flash but nothing else.  Pt was yelling about being stuck too many times even though this writer only stuck him 1x.  He also yelled about this Clinical research associate and the nurse not knowing what they were doing and not being professionals.

## 2020-04-05 NOTE — ED Notes (Signed)
When we tried to draw blood again patient was yelling and cursing, telling us that we dont know what we are doing and we are supposed to be professional.

## 2020-04-05 NOTE — ED Notes (Signed)
Per dr [name]-patient does not need an IV

## 2020-04-05 NOTE — ED Notes (Signed)
#   attempts at IV start and lab draw by this writer unsuccessful IV team consult order obtained from Dr Katrinka Blazing

## 2020-04-05 NOTE — ED Provider Notes (Signed)
[facility]-EMERGENCY DEPT Provider Note   CSN: [id number] Arrival date & time: 04/05/20  2725     History Chief Complaint  Patient presents with  . Suicidal  . Fall  . Leg Pain    [name] is a 58 y.o. male.  The history is provided by the patient and medical records. No language interpreter was used.  Fall  Leg Pain  [name] is a 59 y.o. male who presents to the Emergency Department complaining of multiple complaints. He presents the emergency department complaining of multiple complaints but primarily suicidal ideation as well as frequent falls. He currently lives with his daughter for the last three months. He states that he was seen in the emergency department yesterday and evaluated and discharged home. He states that he was feeling overwhelmed due to loss of to love one's. When he went home he took his gun and put it to his head and pulled the trigger but it jammed. He also states that living with his daughter she is always asking for money. He states that he is compliant with his home medications. He last used alcohol and cocaine six days ago. He is also requesting resources with substance use. His second main complaint is frequent falls. He states that he has been falling frequently. He has been experiencing left leg weakness as well as left arm and leg numbness for the last 6 to 9 months. He states that his doctor is not addressing his pain and thinks this may be contributing to his falls. He did strike his head in the last 24 hours following a fall.  On review of systems patient also complains of vomiting and diarrhea for the last several days as well subjective fevers.  He has been fully vaccinated for COVID-19.   Daughter is Julyan Gales [phone] Past Medical History:  Diagnosis Date  . Anxiety   . Bipolar affective disorder (HCC)   . Bipolar disorder (HCC) 03/11/2012  . Depression   . Diabetes mellitus   . Diabetes mellitus (HCC)  03/11/2012  . Hepatitis 06/30/2013   Type C  . Hypertension   . Schizophrenia, schizo-affective [facility])     Patient Active Problem List   Diagnosis Date Noted  . CVA (cerebral vascular accident) (HCC) 04/05/2020  . Cocaine use disorder (HCC) 01/02/2020  . 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 Homelessness 01/02/2020  . Cocaine use disorder, moderate, dependence (HCC) 10/29/2016  . DKA (diabetic ketoacidoses) (HCC) 02/28/2016  . Chest pain 02/28/2016  . AKI (acute kidney injury) (HCC) 02/28/2016  . Suicidal ideation 02/06/2016  . Cannabis use disorder, moderate, dependence (HCC) 02/06/2016  . Schizoaffective disorder, bipolar type (HCC) 02/05/2016  . Hyperlipidemia 01/18/2015  . Hyperprolactinemia (HCC) 01/18/2015  . Diabetes mellitus (HCC) 03/11/2012  . HTN (hypertension) 03/11/2012    Past Surgical History:  Procedure Laterality Date  . FINGER SURGERY    . WISDOM TOOTH EXTRACTION         Family History  Problem Relation Age of Onset  . Diabetes Mother   . Bipolar disorder Sister     Social History   Tobacco Use  . Smoking status: Never Smoker  . Smokeless tobacco: Never Used  Vaping Use  . Vaping Use: Never used  Substance Use Topics  . Alcohol use: No  . Drug use: Yes    Types: Marijuana, Cocaine    Home Medications Prior to Admission medications   Medication Sig Start Date End Date Taking? Authorizing  Provider  ARIPiprazole (ABILIFY) 15 MG tablet Take 1 tablet (15 mg total) by mouth daily. Patient not taking: Reported on 04/04/2020 01/02/20   Terrilee FilesButler, Michael C, MD  FLUoxetine (PROZAC) 40 MG capsule Take 1 capsule (40 mg total) by mouth daily. Patient not taking: Reported on 04/04/2020 01/02/20   Terrilee FilesButler, Michael C, MD  glipiZIDE (GLUCOTROL) 10 MG tablet Take 1 tablet (10 mg total) by mouth 2 (two) times daily before a meal. 01/02/20   Terrilee FilesButler, Michael C, MD  HUMALOG KWIKPEN 100 UNIT/ML KwikPen Inject 0-0.1 mLs (0-10 Units total) into the  skin 3 (three) times daily with meals. 01/02/20   Terrilee FilesButler, Michael C, MD  hydrOXYzine (ATARAX/VISTARIL) 25 MG tablet Take 1 tablet (25 mg total) by mouth 3 (three) times daily as needed for anxiety. Patient not taking: Reported on 04/04/2020 01/02/20   Terrilee FilesButler, Michael C, MD  insulin detemir (LEVEMIR) 100 UNIT/ML injection Inject 0.5 mLs (50 Units total) into the skin at bedtime. Schedule of insulin is prescribed as it was administered in the hospital.  And will be further managed by your outpatient provider. Patient taking differently: Inject 40 Units into the skin at bedtime. Schedule of insulin is prescribed as it was administered in the hospital.  And will be further managed by your outpatient provider. 01/02/20   Terrilee FilesButler, Michael C, MD  metFORMIN (GLUCOPHAGE) 1000 MG tablet Take 1 tablet (1,000 mg total) by mouth 2 (two) times daily with a meal. 01/02/20   Terrilee FilesButler, Michael C, MD  QUEtiapine (SEROQUEL) 200 MG tablet Take 1 tablet (200 mg total) by mouth at bedtime. Patient not taking: Reported on 04/04/2020 01/02/20 02/01/20  Terrilee FilesButler, Michael C, MD  zolpidem (AMBIEN) 5 MG tablet Take 1 tablet (5 mg total) by mouth at bedtime as needed for sleep. Patient not taking: Reported on 04/04/2020 01/02/20   Terrilee FilesButler, Michael C, MD    Allergies    Wellbutrin [bupropion]  Review of Systems   Review of Systems  All other systems reviewed and are negative.   Physical Exam Updated Vital Signs BP 140/85 (BP Location: Right Arm)   Pulse 79   Temp 98 F (36.7 C) (Oral)   Resp 16   Ht 5\' 9"  (1.753 m)   Wt 79.4 kg   SpO2 100%   BMI 25.84 kg/m   Physical Exam Vitals and nursing note reviewed.  Constitutional:      Appearance: He is well-developed.  HENT:     Head: Normocephalic and atraumatic.  Cardiovascular:     Rate and Rhythm: Normal rate and regular rhythm.     Heart sounds: No murmur heard.   Pulmonary:     Effort: Pulmonary effort is normal. No respiratory distress.     Breath sounds: Normal breath sounds.    Abdominal:     Palpations: Abdomen is soft.     Tenderness: There is no abdominal tenderness. There is no guarding or rebound.  Musculoskeletal:        General: No swelling or deformity.     Right lower leg: No edema.     Left lower leg: No edema.     Comments: Abrasion to left knee. Mild local tenderness with no significant swelling. There is decreased range of motion to the left knee. Patient able to stand without difficulty. 2+ DP pulses.  Skin:    General: Skin is warm and dry.  Neurological:     Mental Status: He is alert and oriented to person, place, and time.  Comments: No asymmetry of facial movements. Five out of five strength and bilateral upper extremities as well as the right lower extremity. Sensation to light touch intact in all four extremities. There is weakness to the left lower extremity approximately and distally but patient states that pain limits his ability to range the leg. The pain is described as throughout the entire leg.  Psychiatric:        Mood and Affect: Mood normal.        Behavior: Behavior normal.     Comments: Calm and cooperative.     ED Results / Procedures / Treatments   Labs (all labs ordered are listed, but only abnormal results are displayed) Labs Reviewed  COMPREHENSIVE METABOLIC PANEL - Abnormal; Notable for the following components:      Result Value   Glucose, Bld 170 (*)    BUN 25 (*)    AST 50 (*)    All other components within normal limits  CBC WITH DIFFERENTIAL/PLATELET  ETHANOL  URINALYSIS, ROUTINE W REFLEX MICROSCOPIC  RAPID URINE DRUG SCREEN, HOSP PERFORMED  HIV ANTIBODY (ROUTINE TESTING W REFLEX)  HEMOGLOBIN A1C  LIPID PANEL    EKG EKG Interpretation  Date/Time:  Thursday April 05 2020 15:54:32 EDT Ventricular Rate:  82 PR Interval:  166 QRS Duration: 86 QT Interval:  390 QTC Calculation: 455 R Axis:   53 Text Interpretation: Normal sinus rhythm Nonspecific ST and T wave abnormality Abnormal ECG Confirmed  by Tilden Fossa [phone]) on 04/05/2020 4:04:19 PM   Radiology DG Chest 2 View  Result Date: 04/04/2020 CLINICAL DATA:  Hyperglycemic, suicidal thoughts X 3 days. History of hypertension, diabetes, schizoaffective and bipolar disorder, hepatitis. EXAM: CHEST - 2 VIEW COMPARISON:  Chest x-ray 02/28/2016. chest x-ray 02/03/2013 FINDINGS: The heart size and mediastinal contours are within normal limits. Both lungs are clear. No acute osseous finding. Multilevel degenerative changes of the spine with chronic mild anterior wedge shaped compression deformity of a midthoracic body IMPRESSION: No active cardiopulmonary disease. Electronically Signed   By: Tish Frederickson M.D.   On: 04/04/2020 10:48   CT Head Wo Contrast  Result Date: 04/05/2020 CLINICAL DATA:  Trauma EXAM: CT HEAD WITHOUT CONTRAST CT CERVICAL SPINE WITHOUT CONTRAST TECHNIQUE: Multidetector CT imaging of the head and cervical spine was performed following the standard protocol without intravenous contrast. Multiplanar CT image reconstructions of the cervical spine were also generated. COMPARISON:  01/18/2015 head CT. FINDINGS: CT HEAD FINDINGS Brain: Age indeterminate focal hypodensities involving the left basal ganglia (3:14) and superior left cerebellum (7:46, new since prior exam. Additional scattered and confluent hypodense foci involving the periventricular and subcortical white matter are nonspecific however may reflect chronic microvascular ischemic changes. No mass lesion. No midline shift, ventriculomegaly or extra-axial fluid collection. Mild cerebral atrophy with ex vacuo dilatation. Vascular: No hyperdense vessel or unexpected calcification. Skull: Negative for fracture or focal lesion. Sinuses/Orbits: Normal orbits. Clear paranasal sinuses. No mastoid effusion. Other: None. CT CERVICAL SPINE FINDINGS Alignment: Straightening of lordosis.  No listhesis. Skull base and vertebrae: No acute fracture. No primary bone lesion or focal pathologic  process. Soft tissues and spinal canal: No prevertebral fluid or swelling. No visible canal hematoma. Disc levels: Multilevel spondylosis most prominent at the C5-7 levels. Patent bony spinal canal. Multilevel mild-to-moderate bony neural foraminal narrowing secondary to uncovertebral and facet hypertrophy. Upper chest: Clear lung apices. Other: None. IMPRESSION: Focal hypodensities involving the left basal ganglia and superior left cerebellum are age indeterminate. Consider MRI brain to exclude subacute  insults. Mild cerebral atrophy and chronic microvascular ischemic changes. No acute fracture or traumatic listhesis.  Multilevel spondylosis. These results were called by telephone at the time of interpretation on 04/05/2020 at 10:51 am to provider [facility] , who verbally acknowledged these results. Electronically Signed   By: Stana Bunting M.D.   On: 04/05/2020 11:02   CT Cervical Spine Wo Contrast  Result Date: 04/05/2020 CLINICAL DATA:  Trauma EXAM: CT HEAD WITHOUT CONTRAST CT CERVICAL SPINE WITHOUT CONTRAST TECHNIQUE: Multidetector CT imaging of the head and cervical spine was performed following the standard protocol without intravenous contrast. Multiplanar CT image reconstructions of the cervical spine were also generated. COMPARISON:  01/18/2015 head CT. FINDINGS: CT HEAD FINDINGS Brain: Age indeterminate focal hypodensities involving the left basal ganglia (3:14) and superior left cerebellum (7:46, new since prior exam. Additional scattered and confluent hypodense foci involving the periventricular and subcortical white matter are nonspecific however may reflect chronic microvascular ischemic changes. No mass lesion. No midline shift, ventriculomegaly or extra-axial fluid collection. Mild cerebral atrophy with ex vacuo dilatation. Vascular: No hyperdense vessel or unexpected calcification. Skull: Negative for fracture or focal lesion. Sinuses/Orbits: Normal orbits. Clear paranasal sinuses. No  mastoid effusion. Other: None. CT CERVICAL SPINE FINDINGS Alignment: Straightening of lordosis.  No listhesis. Skull base and vertebrae: No acute fracture. No primary bone lesion or focal pathologic process. Soft tissues and spinal canal: No prevertebral fluid or swelling. No visible canal hematoma. Disc levels: Multilevel spondylosis most prominent at the C5-7 levels. Patent bony spinal canal. Multilevel mild-to-moderate bony neural foraminal narrowing secondary to uncovertebral and facet hypertrophy. Upper chest: Clear lung apices. Other: None. IMPRESSION: Focal hypodensities involving the left basal ganglia and superior left cerebellum are age indeterminate. Consider MRI brain to exclude subacute insults. Mild cerebral atrophy and chronic microvascular ischemic changes. No acute fracture or traumatic listhesis.  Multilevel spondylosis. These results were called by telephone at the time of interpretation on 04/05/2020 at 10:51 am to provider [facility] , who verbally acknowledged these results. Electronically Signed   By: Stana Bunting M.D.   On: 04/05/2020 11:02   MR Brain Wo Contrast (neuro protocol)  Result Date: 04/05/2020 CLINICAL DATA:  59 year old male status post trauma, neurologic deficit with left side weakness. Age indeterminate ischemia in the left basal ganglia and left cerebellum on plain head CT earlier today. EXAM: MRI HEAD WITHOUT CONTRAST TECHNIQUE: Multiplanar, multiecho pulse sequences of the brain and surrounding structures were obtained without intravenous contrast. COMPARISON:  Head CT 1029 hours today. FINDINGS: Brain: Questionable punctate focus of restricted diffusion at the dorsal right lentiform near the posterior limb of the right internal capsule, occurring in an area of chronic small vessel ischemia and encephalomalacia (series 5, image 80 and series 8, image 15). No other restricted diffusion. Small chronic infarcts are confirmed in the left superior cerebellum (left SCA  territory), the left corona radiata, left basal ganglia, as well as the right thalamus, right corona radiata, and affecting the body of the corpus callosum (series 10, image 36). Moderate patchy T2 hyperintensity in the right paracentral pons also most resembles chronic small-vessel ischemia. No cortical encephalomalacia or definite chronic blood products. No midline shift, mass effect, evidence of mass lesion, ventriculomegaly, extra-axial collection or acute intracranial hemorrhage. Cervicomedullary junction and pituitary are within normal limits. Vascular: Major intracranial vascular flow voids are preserved. Skull and upper cervical spine: Negative for age visible cervical spine, bone marrow signal. Sinuses/Orbits: Negative orbits. Paranasal sinuses and mastoids are stable and well pneumatized.  Other: There is a small volume of fluid trapped in the left petrous apex (series 10, image 12) with facilitated diffusion and no complicating features. Otherwise grossly normal visible internal auditory structures. Scalp and face appear negative. IMPRESSION: 1. Suspect punctate acute on chronic lacunar infarct at the dorsal right lentiform / posterior limb of the right internal capsule. No associated hemorrhage or mass effect. 2. Underlying advanced chronic small vessel disease, with chronic ischemia in the left basal ganglia and the left cerebellum SCA territory corresponding to head CT findings earlier today. Electronically Signed   By: Odessa Fleming M.D.   On: 04/05/2020 13:44   DG Knee Complete 4 Views Left  Result Date: 04/05/2020 CLINICAL DATA:  Left knee pain after fall. EXAM: LEFT KNEE - COMPLETE 4+ VIEW COMPARISON:  None. FINDINGS: No evidence of fracture, dislocation, or joint effusion. No evidence of arthropathy or other focal bone abnormality. Soft tissues are unremarkable. IMPRESSION: Negative. Electronically Signed   By: Lupita Raider M.D.   On: 04/05/2020 11:03    Procedures Procedures (including  critical care time)  Medications Ordered in ED Medications   stroke: mapping our early stages of recovery book (has no administration in time range)  0.9 %  sodium chloride infusion (has no administration in time range)  acetaminophen (TYLENOL) tablet 650 mg (has no administration in time range)    Or  acetaminophen (TYLENOL) 160 MG/5ML solution 650 mg (has no administration in time range)    Or  acetaminophen (TYLENOL) suppository 650 mg (has no administration in time range)  senna-docusate (Senokot-S) tablet 1 tablet (has no administration in time range)  enoxaparin (LOVENOX) injection 40 mg (has no administration in time range)  aspirin suppository 300 mg (has no administration in time range)    Or  aspirin tablet 325 mg (has no administration in time range)  atorvastatin (LIPITOR) tablet 80 mg (has no administration in time range)    ED Course  I have reviewed the triage vital signs and the nursing notes.  Pertinent labs & imaging results that were available during my care of the patient were reviewed by me and considered in my medical decision making (see chart for details).    MDM Rules/Calculators/A&P                         Pt here for evaluation of multiple complaints, predominantly SI and frequent falls.  On exam he is nontoxic appearing.  He does have some LLE weakness - unclear how much is due to pain.  Given frequent falls, report of striking his head CT head and neck obtained.  CT concerning for possible new CVA.  MRI obtained to further evaluate - MRI concerning for subacute CVA.  D/w Dr. Wilford Corner with Neurology, who recommends admission for further work up.  D/w pt findings of studies and recommendation for admission for further evaluation and he is in agreement with treatment plan.  Hospitalist consulted for admission.    Final Clinical Impression(s) / ED Diagnoses Final diagnoses:  Acute CVA (cerebrovascular accident) (HCC)  Suicidal ideation  Fall, initial encounter      Rx / DC Orders ED Discharge Orders    None       Tilden Fossa, MD 04/05/20 [phone]

## 2020-04-05 NOTE — H&P (Addendum)
History and Physical    [name] ACZ:[id number] DOB: [birth year] DOA: 04/05/2020  Referring MD/NP/PA: Tilden Fossa, MD PCP: System, Provider Not In  Patient coming from: home via EMS  Chief Complaint: falls and wanting to kill himself  I have personally briefly reviewed patient's old medical records in [facility]   HPI: [name] is a 59 y.o. male with medical history significant of diabetes mellitus type 2, hepatitis C, and bipolar disorder presents with complaints of falls and wanting to kill himself.  He reports that he has been falling for the last 11 months, but has been more frequent.  He has hit his head on several occasions, but not sure he has lost consciousness. Patient reports having left-sided weakness as the main reason for him falling.  He is complains of having severe pain in his legs secondary to falling so much for which he has been Tylenol PM without any relief.  Patient reports that because of all the pain that he is having he no longer wants to live and wants to kill himself.  He reportedly attempted to place a gun to his head and pulled the trigger, but it jammed.  He has been living with his daughter and her 2 children but her place is too small and does not want to feel like he is a burden on her.  Patient did admit to recently using cocaine and alcohol 6 days ago.    ED Course: Upon admission into the emergency department patient was seen to be afebrile with vital signs stable.  Labs significant for BUN 25, creatinine 1.07, glucose 170, and AST 50.  MRI of the brain was significant for acute on chronic lacunar infarct at the dorsal right lentiform / posterior limb of the right internal capsule.  Neurology have been formally consulted, but recommended transfer to [facility].  Review of Systems  Constitutional: Positive for fever and malaise/fatigue.  HENT: Positive for congestion. Negative for sore throat.   Eyes: Negative for photophobia and pain.    Respiratory: Negative for shortness of breath.   Cardiovascular: Positive for chest pain. Negative for leg swelling.  Gastrointestinal: Positive for diarrhea, nausea and vomiting. Negative for abdominal pain.  Genitourinary: Negative for dysuria and hematuria.  Musculoskeletal: Positive for falls, joint pain and myalgias.  Neurological: Positive for focal weakness. Negative for weakness.  Psychiatric/Behavioral: Positive for substance abuse and suicidal ideas.    Past Medical History:  Diagnosis Date  . Anxiety   . Bipolar affective disorder (HCC)   . Bipolar disorder (HCC) 03/11/2012  . Depression   . Diabetes mellitus   . Diabetes mellitus (HCC) 03/11/2012  . Hepatitis 06/30/2013   Type C  . Hypertension   . Schizophrenia, schizo-affective (HCC)     Past Surgical History:  Procedure Laterality Date  . FINGER SURGERY    . WISDOM TOOTH EXTRACTION       reports that he has never smoked. He has never used smokeless tobacco. He reports current drug use. Drugs: Marijuana and Cocaine. He reports that he does not drink alcohol.  Allergies  Allergen Reactions  . Wellbutrin [Bupropion] Hives and Swelling    Family History  Problem Relation Age of Onset  . Diabetes Mother   . Bipolar disorder Sister     Prior to Admission medications   Medication Sig Start Date End Date Taking? Authorizing Provider  ARIPiprazole (ABILIFY) 15 MG tablet Take 1 tablet (15 mg total) by mouth daily. Patient not taking: Reported on 04/04/2020  01/02/20   Terrilee Files, MD  FLUoxetine (PROZAC) 40 MG capsule Take 1 capsule (40 mg total) by mouth daily. Patient not taking: Reported on 04/04/2020 01/02/20   Terrilee Files, MD  glipiZIDE (GLUCOTROL) 10 MG tablet Take 1 tablet (10 mg total) by mouth 2 (two) times daily before a meal. 01/02/20   Terrilee Files, MD  HUMALOG KWIKPEN 100 UNIT/ML KwikPen Inject 0-0.1 mLs (0-10 Units total) into the skin 3 (three) times daily with meals. 01/02/20   Terrilee Files, MD  hydrOXYzine (ATARAX/VISTARIL) 25 MG tablet Take 1 tablet (25 mg total) by mouth 3 (three) times daily as needed for anxiety. Patient not taking: Reported on 04/04/2020 01/02/20   Terrilee Files, MD  insulin detemir (LEVEMIR) 100 UNIT/ML injection Inject 0.5 mLs (50 Units total) into the skin at bedtime. Schedule of insulin is prescribed as it was administered in the hospital.  And will be further managed by your outpatient provider. Patient taking differently: Inject 40 Units into the skin at bedtime. Schedule of insulin is prescribed as it was administered in the hospital.  And will be further managed by your outpatient provider. 01/02/20   Terrilee Files, MD  metFORMIN (GLUCOPHAGE) 1000 MG tablet Take 1 tablet (1,000 mg total) by mouth 2 (two) times daily with a meal. 01/02/20   Terrilee Files, MD  QUEtiapine (SEROQUEL) 200 MG tablet Take 1 tablet (200 mg total) by mouth at bedtime. Patient not taking: Reported on 04/04/2020 01/02/20 02/01/20  Terrilee Files, MD  zolpidem (AMBIEN) 5 MG tablet Take 1 tablet (5 mg total) by mouth at bedtime as needed for sleep. Patient not taking: Reported on 04/04/2020 01/02/20   Terrilee Files, MD    Physical Exam:  Constitutional: Elderly male who appears to be in NAD, calm, comfortable Vitals:   04/05/20 0936 04/05/20 0937 04/05/20 0942  BP:  140/85   Pulse:  79   Resp:  16   Temp:  98 F (36.7 C)   TempSrc:  Oral   SpO2: 100% 100%   Weight:   79.4 kg  Height:   5\' 9"  (1.753 m)   Eyes: PERRL, lids and conjunctivae normal ENMT: Mucous membranes are moist. Posterior pharynx clear of any exudate or lesions. Fair dentition.  Neck: normal, supple, no masses, no thyromegaly Respiratory: clear to auscultation bilaterally, no wheezing, no crackles. Normal respiratory effort. No accessory muscle use.  Cardiovascular: Regular rate and rhythm, no murmurs / rubs / gallops. No extremity edema. 2+ pedal pulses. No carotid bruits.  Abdomen: no tenderness, no  masses palpated. No hepatosplenomegaly. Bowel sounds positive.  Musculoskeletal: no clubbing / cyanosis. No joint deformity upper and lower extremities. Good ROM, no contractures. Normal muscle tone.  Skin: Abrasions noted of the lower extremities which patient relates to falls. Neurologic: CN 2-12 grossly intact.  Strength appears to be 5/5 patient's bilateral upper extremities and right lower extremity.  On the left lower extremity strength is 4/5. Psychiatric: Alert and oriented x3.  Depressed mood   Labs on Admission: I have personally reviewed following labs and imaging studies  CBC: Recent Labs  Lab 04/04/20 0721 04/05/20 1055  WBC 11.2* 8.2  NEUTROABS  --  5.5  HGB 12.8* 13.0  HCT 38.6* 41.2  MCV 86.2 90.4  PLT 245 204   Basic Metabolic Panel: Recent Labs  Lab 04/04/20 0721 04/05/20 1055  NA 136 135  K 4.5 4.2  CL 101 99  CO2 20* 23  GLUCOSE 397* 170*  BUN 22* 25*  CREATININE 1.27* 1.07  CALCIUM 10.2 9.5   GFR: Estimated Creatinine Clearance: 74.3 mL/min (by C-G formula based on SCr of 1.07 mg/dL). Liver Function Tests: Recent Labs  Lab 04/04/20 1133 04/05/20 1055  AST 27 50*  ALT 17 24  ALKPHOS 57 65  BILITOT 1.4* 1.1  PROT 7.2 7.7  ALBUMIN 3.3* 3.8   No results for input(s): LIPASE, AMYLASE in the last 168 hours. No results for input(s): AMMONIA in the last 168 hours. Coagulation Profile: No results for input(s): INR, PROTIME in the last 168 hours. Cardiac Enzymes: No results for input(s): CKTOTAL, CKMB, CKMBINDEX, TROPONINI in the last 168 hours. BNP (last 3 results) No results for input(s): PROBNP in the last 8760 hours. HbA1C: No results for input(s): HGBA1C in the last 72 hours. CBG: Recent Labs  Lab 04/04/20 1342  GLUCAP 322*   Lipid Profile: No results for input(s): CHOL, HDL, LDLCALC, TRIG, CHOLHDL, LDLDIRECT in the last 72 hours. Thyroid Function Tests: No results for input(s): TSH, T4TOTAL, FREET4, T3FREE, THYROIDAB in the last 72  hours. Anemia Panel: No results for input(s): VITAMINB12, FOLATE, FERRITIN, TIBC, IRON, RETICCTPCT in the last 72 hours. Urine analysis:    Component Value Date/Time   COLORURINE YELLOW 01/02/2020 0804   APPEARANCEUR CLEAR 01/02/2020 0804   LABSPEC 1.032 (H) 01/02/2020 0804   PHURINE 6.0 01/02/2020 0804   GLUCOSEU >=500 (A) 01/02/2020 0804   HGBUR NEGATIVE 01/02/2020 0804   BILIRUBINUR NEGATIVE 01/02/2020 0804   KETONESUR 5 (A) 01/02/2020 0804   PROTEINUR NEGATIVE 01/02/2020 0804   UROBILINOGEN 0.2 02/03/2013 1018   NITRITE NEGATIVE 01/02/2020 0804   LEUKOCYTESUR NEGATIVE 01/02/2020 0804   Sepsis Labs: Recent Results (from the past 240 hour(s))  SARS Coronavirus 2 by RT PCR (hospital order, performed in [facility] hospital lab) Nasopharyngeal Nasopharyngeal Swab     Status: None   Collection Time: 04/04/20 10:12 AM   Specimen: Nasopharyngeal Swab  Result Value Ref Range Status   SARS Coronavirus 2 NEGATIVE NEGATIVE Final    Comment: (NOTE) SARS-CoV-2 target nucleic acids are NOT DETECTED.  The SARS-CoV-2 RNA is generally detectable in upper and lower respiratory specimens during the acute phase of infection. The lowest concentration of SARS-CoV-2 viral copies this assay can detect is 250 copies / mL. A negative result does not preclude SARS-CoV-2 infection and should not be used as the sole basis for treatment or other patient management decisions.  A negative result may occur with improper specimen collection / handling, submission of specimen other than nasopharyngeal swab, presence of viral mutation(s) within the areas targeted by this assay, and inadequate number of viral copies (<250 copies / mL). A negative result must be combined with clinical observations, patient history, and epidemiological information.  Fact Sheet for Patients:   BoilerBrush.com.cy  Fact Sheet for Healthcare Providers: https://pope.com/  This  test is not yet approved or  cleared by the [place] FDA and has been authorized for detection and/or diagnosis of SARS-CoV-2 by FDA under an Emergency Use Authorization (EUA).  This EUA will remain in effect (meaning this test can be used) for the duration of the COVID-19 declaration under Section 564(b)(1) of the Act, 21 U.S.C. section 360bbb-3(b)(1), unless the authorization is terminated or revoked sooner.  Performed at [facility], [address], [place], [place] [address]      Radiological Exams on Admission: DG Chest 2 View  Result Date: 04/04/2020 CLINICAL DATA:  Hyperglycemic, suicidal thoughts  X 3 days. History of hypertension, diabetes, schizoaffective and bipolar disorder, hepatitis. EXAM: CHEST - 2 VIEW COMPARISON:  Chest x-ray 02/28/2016. chest x-ray 02/03/2013 FINDINGS: The heart size and mediastinal contours are within normal limits. Both lungs are clear. No acute osseous finding. Multilevel degenerative changes of the spine with chronic mild anterior wedge shaped compression deformity of a midthoracic body IMPRESSION: No active cardiopulmonary disease. Electronically Signed   By: Tish Frederickson M.D.   On: 04/04/2020 10:48   CT Head Wo Contrast  Result Date: 04/05/2020 CLINICAL DATA:  Trauma EXAM: CT HEAD WITHOUT CONTRAST CT CERVICAL SPINE WITHOUT CONTRAST TECHNIQUE: Multidetector CT imaging of the head and cervical spine was performed following the standard protocol without intravenous contrast. Multiplanar CT image reconstructions of the cervical spine were also generated. COMPARISON:  01/18/2015 head CT. FINDINGS: CT HEAD FINDINGS Brain: Age indeterminate focal hypodensities involving the left basal ganglia (3:14) and superior left cerebellum (7:46, new since prior exam. Additional scattered and confluent hypodense foci involving the periventricular and subcortical white matter are nonspecific however may reflect chronic microvascular ischemic changes. No mass  lesion. No midline shift, ventriculomegaly or extra-axial fluid collection. Mild cerebral atrophy with ex vacuo dilatation. Vascular: No hyperdense vessel or unexpected calcification. Skull: Negative for fracture or focal lesion. Sinuses/Orbits: Normal orbits. Clear paranasal sinuses. No mastoid effusion. Other: None. CT CERVICAL SPINE FINDINGS Alignment: Straightening of lordosis.  No listhesis. Skull base and vertebrae: No acute fracture. No primary bone lesion or focal pathologic process. Soft tissues and spinal canal: No prevertebral fluid or swelling. No visible canal hematoma. Disc levels: Multilevel spondylosis most prominent at the C5-7 levels. Patent bony spinal canal. Multilevel mild-to-moderate bony neural foraminal narrowing secondary to uncovertebral and facet hypertrophy. Upper chest: Clear lung apices. Other: None. IMPRESSION: Focal hypodensities involving the left basal ganglia and superior left cerebellum are age indeterminate. Consider MRI brain to exclude subacute insults. Mild cerebral atrophy and chronic microvascular ischemic changes. No acute fracture or traumatic listhesis.  Multilevel spondylosis. These results were called by telephone at the time of interpretation on 04/05/2020 at 10:51 am to provider [facility] , who verbally acknowledged these results. Electronically Signed   By: Stana Bunting M.D.   On: 04/05/2020 11:02   CT Cervical Spine Wo Contrast  Result Date: 04/05/2020 CLINICAL DATA:  Trauma EXAM: CT HEAD WITHOUT CONTRAST CT CERVICAL SPINE WITHOUT CONTRAST TECHNIQUE: Multidetector CT imaging of the head and cervical spine was performed following the standard protocol without intravenous contrast. Multiplanar CT image reconstructions of the cervical spine were also generated. COMPARISON:  01/18/2015 head CT. FINDINGS: CT HEAD FINDINGS Brain: Age indeterminate focal hypodensities involving the left basal ganglia (3:14) and superior left cerebellum (7:46, new since prior  exam. Additional scattered and confluent hypodense foci involving the periventricular and subcortical white matter are nonspecific however may reflect chronic microvascular ischemic changes. No mass lesion. No midline shift, ventriculomegaly or extra-axial fluid collection. Mild cerebral atrophy with ex vacuo dilatation. Vascular: No hyperdense vessel or unexpected calcification. Skull: Negative for fracture or focal lesion. Sinuses/Orbits: Normal orbits. Clear paranasal sinuses. No mastoid effusion. Other: None. CT CERVICAL SPINE FINDINGS Alignment: Straightening of lordosis.  No listhesis. Skull base and vertebrae: No acute fracture. No primary bone lesion or focal pathologic process. Soft tissues and spinal canal: No prevertebral fluid or swelling. No visible canal hematoma. Disc levels: Multilevel spondylosis most prominent at the C5-7 levels. Patent bony spinal canal. Multilevel mild-to-moderate bony neural foraminal narrowing secondary to uncovertebral and facet hypertrophy. Upper chest:  Clear lung apices. Other: None. IMPRESSION: Focal hypodensities involving the left basal ganglia and superior left cerebellum are age indeterminate. Consider MRI brain to exclude subacute insults. Mild cerebral atrophy and chronic microvascular ischemic changes. No acute fracture or traumatic listhesis.  Multilevel spondylosis. These results were called by telephone at the time of interpretation on 04/05/2020 at 10:51 am to provider [facility] , who verbally acknowledged these results. Electronically Signed   By: Stana Buntinghikanele  Emekauwa M.D.   On: 04/05/2020 11:02   MR Brain Wo Contrast (neuro protocol)  Result Date: 04/05/2020 CLINICAL DATA:  59 year old male status post trauma, neurologic deficit with left side weakness. Age indeterminate ischemia in the left basal ganglia and left cerebellum on plain head CT earlier today. EXAM: MRI HEAD WITHOUT CONTRAST TECHNIQUE: Multiplanar, multiecho pulse sequences of the brain and  surrounding structures were obtained without intravenous contrast. COMPARISON:  Head CT 1029 hours today. FINDINGS: Brain: Questionable punctate focus of restricted diffusion at the dorsal right lentiform near the posterior limb of the right internal capsule, occurring in an area of chronic small vessel ischemia and encephalomalacia (series 5, image 80 and series 8, image 15). No other restricted diffusion. 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 Moderate patchy T2 hyperintensity in the right paracentral pons also most resembles chronic small-vessel ischemia. No cortical encephalomalacia or definite chronic blood products. No midline shift, mass effect, evidence of mass lesion, ventriculomegaly, extra-axial collection or acute intracranial hemorrhage. Cervicomedullary junction and pituitary are within normal limits. Vascular: Major intracranial vascular flow voids are preserved. Skull and upper cervical spine: Negative for age visible cervical spine, bone marrow signal. Sinuses/Orbits: Negative orbits. Paranasal sinuses and mastoids are stable and well pneumatized. Other: There is a small volume of fluid trapped in the left petrous apex (series 10, image 12) with facilitated diffusion and no complicating features. Otherwise grossly normal visible internal auditory structures. Scalp and face appear negative. IMPRESSION: 1. Suspect punctate acute on chronic lacunar infarct at the dorsal right lentiform / posterior limb of the right internal capsule. No associated hemorrhage or mass effect. 2. Underlying advanced chronic small vessel disease, with chronic ischemia in the left basal ganglia and the left cerebellum SCA territory corresponding to head CT findings earlier today. Electronically Signed   By: Odessa FlemingH   Hall M.D.   On: 04/05/2020 13:44   DG Knee Complete 4 Views Left  Result Date: 04/05/2020 CLINICAL DATA:  Left knee pain after fall. EXAM: LEFT KNEE - COMPLETE 4+ VIEW COMPARISON:  None. FINDINGS: No evidence of fracture, dislocation, or joint effusion. No evidence of arthropathy or other focal bone abnormality. Soft tissues are unremarkable. IMPRESSION: Negative. Electronically Signed   By: Lupita RaiderJames  Green Jr M.D.   On: 04/05/2020 11:03    EKG: Independently reviewed.  Sinus rhythm at 91 bpm.  Assessment/Plan CVA: Acute on chronic.  Patient presents with reports left-sided weakness with recurrent falls.  MRI was significant for acute on chronic lacunar infarct at the dorsal right lentiform/posterior limb of the right internal capsule.  Dr. Jerrell BelfastAurora -Admit to telemetry bed -Stroke order set initiated -Neuro checks -Add-on Hemoglobin A1c and lipid panel in a.m. -Check vascular carotid Doppler ultrasound -PT/OT/Speech to evaluate and treat -Check echocardiogram -ASA 325 mg daily -Transitions of care consulted for need of placement. -Appreciate neurology consultative services, will  follow-up   Suicidal ideations/suicide attempt Schizoaffective/bipolar disorder: Acute on chronic.  Patient reports having suicidal ideations with recent suicide attempt with a gun.  He does not appear to be on any of his previous psych medications. -Suicide precautions with sitter -Currently in agreement with being admitted but would need to be involuntary committed tried to leave -Will need a formal psychiatric consult   Frequent falls: Suspect secondary to weakness related with stroke.. -PT/OT  Diabetes mellitus type 2, uncontrolled with hyperglycemia: On admission patient's blood sugars were noted to be elevated up to 170.  Last hemoglobin A1c was 11.4 on 01/02/2020.  Home medications include Lantus 40 units nightly, Humalog 0 to 10 units 3 times daily with meals, glipizide 10 mg twice daily, and Metformin 1000 mg  twice daily. -Hypoglycemic protocols -Held glipizide and remain. -Continue home Lantus at 40 units nightly -CBGs before every meal with moderate SSI  Hyperlipidemia  -Follow-up lipid panel -Atorvastatin 80 mg daily.  Polysubstance abuse: Acute on chronic.  Patient recently admitted to cocaine and alcohol use.  On admission alcohol level was noted to be undetectable. -Check urine drug screen -CIWA protocols  Hepatitis C: Patient reports was previously treated several years ago but he is not sure  DVT prophylaxis: Lovenox Code Status: Full  Family Communication: None requested. Disposition Plan: To be determined Consults called:  Neurology Admission status: Inpatient  Clydie Braun MD [facility] Pager [phone]   If 7PM-7AM, please contact night-coverage [email or web address] Password [email or web address]  04/05/2020, 2:58 PM

## 2020-04-05 NOTE — ED Notes (Signed)
Sent down small amount of blood to lab.  Unable to draw more.  Waiting to see if it is enough for test.

## 2020-04-05 NOTE — TOC Initial Note (Signed)
Transition of Care [facility]) - Initial/Assessment Note    Patient Details  Name: [name] MRN: [id number] Date of Birth: 1960-11-13  Transition of Care [facility]) CM/SW Contact:    Elliot Cousin, RN Phone Number:  [phone] 04/05/2020, 6:44 PM  Clinical Narrative:                 TOC CM attempted to speak to pt. Gave permission to speak to dtr, but no contact information for dtr. Attempted call to pt's mother. No answer. Waiting PT evaluation for SNF.   Expected Discharge Plan: Skilled Nursing Facility Barriers to Discharge: Continued Medical Work up   Patient Goals and CMS Choice Patient states their goals for this hospitalization and ongoing recovery are:: yes i want to go to SNF CMS Medicare.gov Compare Post Acute Care list provided to:: Patient Choice offered to / list presented to : Patient  Expected Discharge Plan and Services Expected Discharge Plan: Skilled Nursing Facility In-house Referral: Clinical Social Work Discharge Planning Services: CM Consult Post Acute Care Choice: Skilled Nursing Facility Living arrangements for the past 2 months: Single Family Home                                      Prior Living Arrangements/Services Living arrangements for the past 2 months: Single Family Home Lives with::  (unknown) Patient language and need for interpreter reviewed:: Yes        Need for Family Participation in Patient Care: Yes (Comment) Care giver support system in place?: Yes (comment)   Criminal Activity/Legal Involvement Pertinent to Current Situation/Hospitalization: No - Comment as needed  Activities of Daily Living      Permission Sought/Granted Permission sought to share information with : Case Manager, Facility Medical sales representative, PCP, Family Supports Permission granted to share information with : Yes, Verbal Permission Granted  Share Information with NAME: Lanora Manis  Permission granted to share info w AGENCY: SNF  Permission  granted to share info w Relationship: mother, daughter  Permission granted to share info w Contact Information: [phone]  Emotional Assessment Appearance:: Appears stated age Attitude/Demeanor/Rapport: Guarded Affect (typically observed): Agitated Orientation: : Oriented to Self   Psych Involvement: Yes (comment)  Admission diagnosis:  CVA (cerebral vascular accident) [facility]) [I63.9] Patient Active Problem List   Diagnosis Date Noted  . CVA (cerebral vascular accident) (HCC) 04/05/2020  . Cocaine use disorder (HCC) 01/02/2020  . 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 Homelessness 01/02/2020  . Cocaine use disorder, moderate, dependence (HCC) 10/29/2016  . DKA (diabetic ketoacidoses) (HCC) 02/28/2016  . Chest pain 02/28/2016  . AKI (acute kidney injury) (HCC) 02/28/2016  . Suicidal ideation 02/06/2016  . Cannabis use disorder, moderate, dependence (HCC) 02/06/2016  . Schizoaffective disorder, bipolar type (HCC) 02/05/2016  . Hyperlipidemia 01/18/2015  . Hyperprolactinemia (HCC) 01/18/2015  . Diabetes mellitus (HCC) 03/11/2012  . HTN (hypertension) 03/11/2012   PCP:  System, Provider Not In Pharmacy:   [id number] - [address] [place]), [place] - [address] [place] ([place]) [place] [address] Phone: [phone] Fax: [phone]  [facility] - [place], [place] - [address] [place] [address] [place] [address] Phone: [phone] Fax: [phone]     Social Determinants of Health (SDOH) Interventions    Readmission Risk Interventions No flowsheet data found.

## 2020-04-05 NOTE — ED Triage Notes (Addendum)
Per EMS- patient reports that he was seen at [facility] ED for c/o bilateral leg pain yesterday.  Patient reported to the EMS that he has been falling frequently and having bilateral leg pain.  When writer entered the room patient yelled out, "I am suicidal and I want to go to a group home." Patient states he has been falling a lot and does have an abrasion to the left knee.  Patient stated, "I tried to shoot myself, but the gun jammed up. If I leave I will shoot myself. I can't walk. I had a stroke. What is the use of living. I'm also hearing voices."

## 2020-04-05 NOTE — ED Notes (Signed)
PT ITEMS INCLUDE 1 BANK CARD 1 WATCH CLOTHING AND SHOES ALL LOCATED IN LOCKER 28

## 2020-04-06 ENCOUNTER — Inpatient Hospital Stay (HOSPITAL_COMMUNITY): Payer: Medicare Other

## 2020-04-06 ENCOUNTER — Encounter (HOSPITAL_COMMUNITY): Payer: Self-pay | Admitting: Internal Medicine

## 2020-04-06 DIAGNOSIS — I313 Pericardial effusion (noninflammatory): Secondary | ICD-10-CM

## 2020-04-06 DIAGNOSIS — I639 Cerebral infarction, unspecified: Secondary | ICD-10-CM

## 2020-04-06 LAB — ECHOCARDIOGRAM COMPLETE
Area-P 1/2: 2.73 cm2
Calc EF: 54.9 %
Height: 69 in
S' Lateral: 2.5 cm
Single Plane A2C EF: 51.4 %
Single Plane A4C EF: 59 %
Weight: 2800 oz

## 2020-04-06 LAB — MAGNESIUM: Magnesium: 1.4 mg/dL — ABNORMAL LOW (ref 1.7–2.4)

## 2020-04-06 LAB — CBG MONITORING, ED
Glucose-Capillary: 95 mg/dL (ref 70–99)
Glucose-Capillary: 126 mg/dL — ABNORMAL HIGH (ref 70–99)
Glucose-Capillary: 125 mg/dL — ABNORMAL HIGH (ref 70–99)

## 2020-04-06 LAB — BASIC METABOLIC PANEL
Anion gap: 8 (ref 5–15)
BUN: 20 mg/dL (ref 6–20)
CO2: 24 mmol/L (ref 22–32)
Calcium: 8.6 mg/dL — ABNORMAL LOW (ref 8.9–10.3)
Chloride: 105 mmol/L (ref 98–111)
Creatinine, Ser: 0.91 mg/dL (ref 0.61–1.24)
GFR calc Af Amer: >60 mL/min (ref >60)
GFR calc non Af Amer: >60 mL/min (ref >60)
Glucose, Bld: 116 mg/dL — ABNORMAL HIGH (ref 70–99)
Potassium: 3.3 mmol/L — ABNORMAL LOW (ref 3.5–5.1)
Sodium: 137 mmol/L (ref 135–145)

## 2020-04-06 LAB — CBC
HCT: 30.5 % — ABNORMAL LOW (ref 39.0–52.0)
Hemoglobin: 10.5 g/dL — ABNORMAL LOW (ref 13.0–17.0)
MCH: 29.3 pg (ref 26.0–34.0)
MCHC: 34.4 g/dL (ref 30.0–36.0)
MCV: 85.2 fL (ref 80.0–100.0)
Platelets: 201 10*3/uL (ref 150–400)
RBC: 3.58 MIL/uL — ABNORMAL LOW (ref 4.22–5.81)
RDW: 11.9 % (ref 11.5–15.5)
WBC: 5.5 10*3/uL (ref 4.0–10.5)
nRBC: 0 % (ref 0.0–0.2)

## 2020-04-06 LAB — PHOSPHORUS: Phosphorus: 3.7 mg/dL (ref 2.5–4.6)

## 2020-04-06 LAB — SARS CORONAVIRUS 2 BY RT PCR (HOSPITAL ORDER, PERFORMED IN ~~LOC~~ HOSPITAL LAB): SARS Coronavirus 2: NEGATIVE

## 2020-04-06 LAB — GLUCOSE, CAPILLARY: Glucose-Capillary: 304 mg/dL — ABNORMAL HIGH (ref 70–99)

## 2020-04-06 LAB — HIV ANTIBODY (ROUTINE TESTING W REFLEX): HIV Screen 4th Generation wRfx: NONREACTIVE

## 2020-04-06 MED ORDER — IOHEXOL 350 MG/ML SOLN
100.0000 mL | Freq: Once | INTRAVENOUS | Status: AC | PRN
  Administered 2020-04-06: 100 mL via INTRAVENOUS

## 2020-04-06 NOTE — Progress Notes (Signed)
Received pt from ED, telemetry monitor applied, vital signs obtained, sitter at bedside and oriented to unit and call light in reach.

## 2020-04-06 NOTE — Progress Notes (Signed)
Carotid artery duplex has been completed. Preliminary results can be found in CV Proc through chart review.   04/06/20 11:55 AM Olen Cordial RVT

## 2020-04-06 NOTE — Consult Note (Addendum)
NEUROHOSPITALISTS-CONSULTATION NOTE    Requesting Physician: Dr. Lajuana Ripple  Admit date: 04/05/20 Chief Complaint: Stroke  History obtained from:  Patient and Chart      HPI   [name] is a 59 year old male with medical history significant for diabetes mellitus type 2, hepatitis C, polysubstance abuse and bipolar disorder who presents with complaints of falls due to left-sided weakness and wanting to kill himself.  Given his persistent complaint of ongoing left-sided weakness, and MRI of the brain was obtained as a part of his work up.  MRI brain revealed a punctate acute on chronic lacunar infarct at the dorsal right lentiform/posterior limb of the right internal capsule for which neurology was consulted.   When asked about his symptoms, the patient states that he has noted left sided weakness, slurred speech and a facial droop. He states he does not know when his symptoms started. He also notes pain in his left knee and does not know how long this has been going on.  On repeat questioning-he reports that the left-sided weakness has been ongoing for 9 months.  He does not remember a sudden change in symptoms or sudden worsening but says maybe a few weeks ago he might have started noticing more frequent difficulty with ambulation and increasing falls.    Of note, history taking was limited due to patients agitation and him answering yes to every question that he was asked. Please review further history taken from the medicine H&P below:   "[name] is a 59 y.o. male with medical history significant of diabetes mellitus type 2, hepatitis C, and bipolar disorder presents with complaints of falls and wanting to kill himself.  He reports that he has been falling for the last 11 months, but has been more frequent.  He has hit his head on several occasions, but not sure he has lost consciousness. Patient reports having left-sided weakness as the main reason for him falling.  He  is complains of having severe pain in his legs secondary to falling so much for which he has been Tylenol PM without any relief.  Patient reports that because of all the pain that he is having he no longer wants to live and wants to kill himself.  He reportedly attempted to place a gun to his head and pulled the trigger, but it jammed.  He has been living with his daughter and her 2 children but her place is too small and does not want to feel like he is a burden on her.  Patient did admit to recently using cocaine and alcohol 6 days ago."   Past Medical History    Past Medical History:  Diagnosis Date  . Anxiety   . Bipolar affective disorder (HCC)   . Bipolar disorder (HCC) 03/11/2012  . Depression   . Diabetes mellitus   . Diabetes mellitus (HCC) 03/11/2012  . Hepatitis 06/30/2013   Type C  . Hypertension   . Schizophrenia, schizo-affective [facility])      Past Surgical History   Past Surgical History:  Procedure Laterality Date  . FINGER SURGERY    . WISDOM TOOTH EXTRACTION       Family History   Family History  Problem Relation Age of Onset  . Diabetes Mother   . Bipolar disorder Sister     Social History  Social History:  reports that he has never smoked. He has never used smokeless tobacco. He reports current drug use. Drugs: Marijuana and Cocaine. He reports that  he does not drink alcohol.   Allergies  Allergies:  Allergies  Allergen Reactions  . Wellbutrin [Bupropion] Hives and Swelling     Medications Prior to Admission    I have reviewed the patient's current medications.   ROS  Negative except for HPI    Physical Examination          Vitals:    10/18/17 1445 10/18/17 1515 10/18/17 1545 10/18/17 1600  BP: 103/68 (!) 93/58 118/76 (!) 120/101  Pulse: (!) 58 (!) 56 (!) 59 (!) 59  Resp: 17 15 16 14   Temp:          TempSrc:          SpO2: 98% 99% 100% 100%  Weight:          Height:            Constitutional: Resting in bed, alert and  agitated Cardiovascular - Regular rate and rhythm  Respiratory -  Non-labored breathing, No wheezing.    Neurological Examination  Blood pressure (!) 101/59, pulse 77, temperature 99 F (37.2 C), temperature source Oral, resp. rate 17, height 5\' 9"  (1.753 m), weight 79.4 kg, SpO2 96 %. Mental Status: Alert, oriented, thought content appropriate.  Speech fluent without evidence of aphasia.  Able to follow 3 step commands without difficulty. Cranial Nerves: PERRL, EOMI, VFF, Face is symmetric Shoulder shrug intact, tongue is midline.  Motor: Tone and bulk normal throughout, no atrophy noted    Dlt Bic Tri FgS Grp HF  KnF KnE PIF DoF  R 5 5 5 5 5 5 5 5 5 5   L 5 5 5 5 5 4  4+  4 4   Sensory: Intact to light tough throughout  Deep Tendon Reflexes: Deferred Coordination: No ataxia with FNF, refuses to participate in HTS testing  Gait: Deferred   NIHSS  1a Level of Conscious: 0  1b LOC Questions: 0 1c LOC Commands: 0 2 Best Gaze: 0 3 Visual: 0 4 Facial Palsy: 0 5a Motor Arm - Left: 0 5b Motor Arm - Right: 0 6a Motor Leg - Left: 1 6b Motor Leg - Right: 0 7 Limb Ataxia: 0 8 Sensory: 0 9 Best Language: 0 10 Dysarthria: 0 11 Neglect: 0   TOTAL: 1    Laboratory Results   Recent Labs  Lab 04/04/20 0721 04/05/20 1055 04/06/20 0403  NA 136 135 137  K 4.5 4.2 3.3*  CL 101 99 105  CO2 20* 23 24  GLUCOSE 397* 170* 116*  BUN 22* 25* 20  CREATININE 1.27* 1.07 0.91  CALCIUM 10.2 9.5 8.6*  MG  --   --  1.4*  PHOS  --   --  3.7    Recent Labs  Lab 04/05/20 1055  CHOL 254*  TRIG 85  HDL 66  CHOLHDL 3.8  VLDL 17  LDLCALC 06/04/20*     Recent Labs  Lab 04/04/20 1342 04/05/20 2304  GLUCAP 322* 377*     Imaging Results   Imaging: DG Chest 2 View  Result Date: 04/04/2020 CLINICAL DATA:  Hyperglycemic, suicidal thoughts X 3 days. History of hypertension, diabetes, schizoaffective and bipolar disorder, hepatitis. EXAM: CHEST - 2 VIEW COMPARISON:  Chest x-ray 02/28/2016.  chest x-ray 02/03/2013 FINDINGS: The heart size and mediastinal contours are within normal limits. Both lungs are clear. No acute osseous finding. Multilevel degenerative changes of the spine with chronic mild anterior wedge shaped compression deformity of a midthoracic body IMPRESSION: No active cardiopulmonary disease. Electronically Signed  By: Tish FredericksonMorgane  Naveau M.D.   On: 04/04/2020 10:48   CT Head Wo Contrast  Result Date: 04/05/2020 CLINICAL DATA:  Trauma EXAM: CT HEAD WITHOUT CONTRAST CT CERVICAL SPINE WITHOUT CONTRAST TECHNIQUE: Multidetector CT imaging of the head and cervical spine was performed following the standard protocol without intravenous contrast. Multiplanar CT image reconstructions of the cervical spine were also generated. COMPARISON:  01/18/2015 head CT. FINDINGS: CT HEAD FINDINGS Brain: Age indeterminate focal hypodensities involving the left basal ganglia (3:14) and superior left cerebellum (7:46, new since prior exam. Additional scattered and confluent hypodense foci involving the periventricular and subcortical white matter are nonspecific however may reflect chronic microvascular ischemic changes. No mass lesion. No midline shift, ventriculomegaly or extra-axial fluid collection. Mild cerebral atrophy with ex vacuo dilatation. Vascular: No hyperdense vessel or unexpected calcification. Skull: Negative for fracture or focal lesion. Sinuses/Orbits: Normal orbits. Clear paranasal sinuses. No mastoid effusion. Other: None. CT CERVICAL SPINE FINDINGS Alignment: Straightening of lordosis.  No listhesis. Skull base and vertebrae: No acute fracture. No primary bone lesion or focal pathologic process. Soft tissues and spinal canal: No prevertebral fluid or swelling. No visible canal hematoma. Disc levels: Multilevel spondylosis most prominent at the C5-7 levels. Patent bony spinal canal. Multilevel mild-to-moderate bony neural foraminal narrowing secondary to uncovertebral and facet hypertrophy.  Upper chest: Clear lung apices. Other: None. IMPRESSION: Focal hypodensities involving the left basal ganglia and superior left cerebellum are age indeterminate. Consider MRI brain to exclude subacute insults. Mild cerebral atrophy and chronic microvascular ischemic changes. No acute fracture or traumatic listhesis.  Multilevel spondylosis. These results were called by telephone at the time of interpretation on 04/05/2020 at 10:51 am to provider [facility] , who verbally acknowledged these results. Electronically Signed   By: Stana Buntinghikanele  Emekauwa M.D.   On: 04/05/2020 11:02   CT Cervical Spine Wo Contrast  Result Date: 04/05/2020 CLINICAL DATA:  Trauma EXAM: CT HEAD WITHOUT CONTRAST CT CERVICAL SPINE WITHOUT CONTRAST TECHNIQUE: Multidetector CT imaging of the head and cervical spine was performed following the standard protocol without intravenous contrast. Multiplanar CT image reconstructions of the cervical spine were also generated. COMPARISON:  01/18/2015 head CT. FINDINGS: CT HEAD FINDINGS Brain: Age indeterminate focal hypodensities involving the left basal ganglia (3:14) and superior left cerebellum (7:46, new since prior exam. Additional scattered and confluent hypodense foci involving the periventricular and subcortical white matter are nonspecific however may reflect chronic microvascular ischemic changes. No mass lesion. No midline shift, ventriculomegaly or extra-axial fluid collection. Mild cerebral atrophy with ex vacuo dilatation. Vascular: No hyperdense vessel or unexpected calcification. Skull: Negative for fracture or focal lesion. Sinuses/Orbits: Normal orbits. Clear paranasal sinuses. No mastoid effusion. Other: None. CT CERVICAL SPINE FINDINGS Alignment: Straightening of lordosis.  No listhesis. Skull base and vertebrae: No acute fracture. No primary bone lesion or focal pathologic process. Soft tissues and spinal canal: No prevertebral fluid or swelling. No visible canal hematoma. Disc  levels: Multilevel spondylosis most prominent at the C5-7 levels. Patent bony spinal canal. Multilevel mild-to-moderate bony neural foraminal narrowing secondary to uncovertebral and facet hypertrophy. Upper chest: Clear lung apices. Other: None. IMPRESSION: Focal hypodensities involving the left basal ganglia and superior left cerebellum are age indeterminate. Consider MRI brain to exclude subacute insults. Mild cerebral atrophy and chronic microvascular ischemic changes. No acute fracture or traumatic listhesis.  Multilevel spondylosis. These results were called by telephone at the time of interpretation on 04/05/2020 at 10:51 am to provider [facility] , who verbally acknowledged these results. Electronically  Signed   By: Stana Bunting M.D.   On: 04/05/2020 11:02   MR Brain Wo Contrast (neuro protocol)  Result Date: 04/05/2020 CLINICAL DATA:  59 year old male status post trauma, neurologic deficit with left side weakness. Age indeterminate ischemia in the left basal ganglia and left cerebellum on plain head CT earlier today. EXAM: MRI HEAD WITHOUT CONTRAST TECHNIQUE: Multiplanar, multiecho pulse sequences of the brain and surrounding structures were obtained without intravenous contrast. COMPARISON:  Head CT 1029 hours today. FINDINGS: Brain: Questionable punctate focus of restricted diffusion at the dorsal right lentiform near the posterior limb of the right internal capsule, occurring in an area of chronic small vessel ischemia and encephalomalacia (series 5, image 80 and series 8, image 15). No other restricted diffusion. 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 Moderate patchy T2 hyperintensity in the right paracentral pons also most resembles chronic small-vessel ischemia. No cortical  encephalomalacia or definite chronic blood products. No midline shift, mass effect, evidence of mass lesion, ventriculomegaly, extra-axial collection or acute intracranial hemorrhage. Cervicomedullary junction and pituitary are within normal limits. Vascular: Major intracranial vascular flow voids are preserved. Skull and upper cervical spine: Negative for age visible cervical spine, bone marrow signal. Sinuses/Orbits: Negative orbits. Paranasal sinuses and mastoids are stable and well pneumatized. Other: There is a small volume of fluid trapped in the left petrous apex (series 10, image 12) with facilitated diffusion and no complicating features. Otherwise grossly normal visible internal auditory structures. Scalp and face appear negative. IMPRESSION: 1. Suspect punctate acute on chronic lacunar infarct at the dorsal right lentiform / posterior limb of the right internal capsule. No associated hemorrhage or mass effect. 2. Underlying advanced chronic small vessel disease, with chronic ischemia in the left basal ganglia and the left cerebellum SCA territory corresponding to head CT findings earlier today. Electronically Signed   By: Odessa Fleming M.D.   On: 04/05/2020 13:44   DG Knee Complete 4 Views Left  Result Date: 04/05/2020 CLINICAL DATA:  Left knee pain after fall. EXAM: LEFT KNEE - COMPLETE 4+ VIEW COMPARISON:  None. FINDINGS: No evidence of fracture, dislocation, or joint effusion. No evidence of arthropathy or other focal bone abnormality. Soft tissues are unremarkable. IMPRESSION: Negative. Electronically Signed   By: Lupita Raider M.D.   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 On examination extensive LUE + LLE weakness were not noted however he has noted left sided weakness with  unclear onset which does correlate to the area in which he has had his stroke. Of note, he presented outside the time window for acute intervention including IVTPA + mechanical thrombectomy.   Stroke work up so far has included MRI Brain and Echocardiogram. Echocardiogram results are pending. Stroke labs have been completed; LDL 171 and Hemoglobin A1C 14.2. More than likely his stroke etiology is SVD in the setting of uncontrolled hyperlipidemia and diabetes.    Please view recommendations as follows: - For secondary stroke prevention continue Aspirin 325 mg + Atorvastatin 80 mg - F/U echocardiogram results  - Neuro checks Q4, NIHSS Q12  -  PT/OT/ST evaluation  - Please obtain CTA Head and Neck as a part of vessel imaging  - No need for permissive hypertension given stroke is acute on chronic and noted on FLAIR sequencing. Long term SBP goal is < 140/90  - Counsel on abstaining from cocaine use.  Will continue to follow given pending work up   -- Stark Jock, NP  [facility] Nurse Practitioner  Patient seen and discussed with attending physician Dr. Wilford Corner   Attending addendum Patient seen and examined at [facility] 59 year old man with past history of diabetes hepatitis C polysubstance use and bipolar disorder came in with complaints of left-sided weakness leading to falls that has been going on for many months.  Very unreliable historian.  Reports the complaints have been on there for 9 months but later on was able to say that some acute worsening was noted to couple weeks ago. Also exhibited suicidal ideation while at the hospital. Admitted to the hospitalist for further work-up because the MRI of the brain that was done to evaluate for this weakness showed a punctate area of restricted diffusion which looks to be subacute, but still restricting on DWI with positive FLAIR correlate warranting stroke work-up. On examination, agree with exam documented above.  I  independently examined the patient.  NIH stroke scale 1. Urinary toxicology screen positive for cocaine. MRI reviewed independently-as above. Assessment and plan-as above-I helped formulate. Awaiting transfer to [facility]. Since he has been addressed in the hospital most of the work-up is complete, I will try to touch base with the primary hospitalist to see if he can stay at [facility] long and we can follow him as needed at [facility] long hospital to complete the work-up.  He would need psychiatry evaluation and clearance is, which he might be better served at [facility] long hospital  -- Milon Dikes, MD [facility] Pager: [phone] If 7pm to 7am, please call on call as listed on AMION.

## 2020-04-06 NOTE — Progress Notes (Signed)
  Echocardiogram 2D Echocardiogram has been performed.  [name] 04/06/2020, 12:08 PM

## 2020-04-06 NOTE — Progress Notes (Signed)
PROGRESS NOTE    [name]  ZHY:[id number]  DOB: 1961/02/22  PCP: System, Provider Not In Admit date:04/05/2020 Chief compliant: suicidal, falls 59 y.o. male with medical history significant of diabetes mellitus type 2, hepatitis C, and bipolar disorder presented to ED with complaints of falls and wanting to kill himself.  He reports that he has been falling for the last 11 months, but has been more frequent.  He has hit his head on several occasions, but not sure he has lost consciousness. Patient reports having left-sided weakness as the main reason for him falling.  He is complains of having severe pain in his legs secondary to falling so much for which he has been Tylenol PM without any relief.  Patient reports that because of all the pain that he is having he no longer wants to live and wants to kill himself.  He reportedly attempted to place a gun to his head and pulled the trigger, but it jammed.  He has been living with his daughter and her 2 children but her place is too small and does not want to feel like he is a burden on her.  Patient did admit to recently using cocaine and alcohol 6 days ago.   ED Course: Afebrile,Labs significant for BUN 25, creatinine 1.07, glucose 170, and AST 50.  MRI of the brain was significant for acute on chronic lacunar infarct at the dorsalright lentiform / posterior limb of the right internal capsule. Hospital course: Patient admitted to [facility] for further evaluation and management of acute stroke and depression with suicidal attempt.  Started on aspirin, placed on safety precautions with sitter.  Initially admit orders placed to [facility] campus based on neurology recommendations.  Subjective:  Patient still in the ED awaiting bed availability.  His speech appears clear, he has some weakness in left lower extremity but able to move spontaneously and sleeping with both his legs folded.  He states he feels frustrated about his living situation, endorses continued  suicidal ideation.  Sitter at bedside.  Objective: Vitals:   04/06/20 0700 04/06/20 0715 04/06/20 0758 04/06/20 0945  BP: (!) 101/59  107/68 125/70  Pulse: 73 77 67 73  Resp:   15 16  Temp:      TempSrc:      SpO2: (!) 89% 96% 100% 96%  Weight:      Height:        Intake/Output Summary (Last 24 hours) at 04/06/2020 1245 Last data filed at 04/06/2020 1030 Gross per 24 hour  Intake 1840 ml  Output 500 ml  Net 1340 ml   Filed Weights   04/05/20 0942  Weight: 79.4 kg    Physical Examination:  General: Moderately built, no acute distress noted Head ENT: Atraumatic normocephalic, no significant facial droop appreciated during my exam.  PERRLA, neck supple Heart: S1-S2 heard, regular rate and rhythm, no murmurs.  No leg edema noted Lungs: Equal air entry bilaterally, no rhonchi or rales on exam, no accessory muscle use Abdomen: Bowel sounds heard, soft, nontender, nondistended. No organomegaly.  No CVA tenderness Extremities: No pedal edema.  No cyanosis or clubbing. Neurological: Awake alert oriented x3, weakness in LLE with strength 3/5, upper extremities with intact strength, sensations intact to crude touch.   Skin: Small abrasion to the right knee which appears to be subacute and healing     Data Reviewed: I have personally reviewed following labs and imaging studies  CBC: Recent Labs  Lab 04/04/20 0721 04/05/20 1055 04/06/20 0403  WBC 11.2* 8.2 5.5  NEUTROABS  --  5.5  --   HGB 12.8* 13.0 10.5*  HCT 38.6* 41.2 30.5*  MCV 86.2 90.4 85.2  PLT 245 204 201   Basic Metabolic Panel: Recent Labs  Lab 04/04/20 0721 04/05/20 1055 04/06/20 0403  NA 136 135 137  K 4.5 4.2 3.3*  CL 101 99 105  CO2 20* 23 24  GLUCOSE 397* 170* 116*  BUN 22* 25* 20  CREATININE 1.27* 1.07 0.91  CALCIUM 10.2 9.5 8.6*  MG  --   --  1.4*  PHOS  --   --  3.7   GFR: Estimated Creatinine Clearance: 87.4 mL/min (by C-G formula based on SCr of 0.91 mg/dL). Liver Function  Tests: Recent Labs  Lab 04/04/20 1133 04/05/20 1055  AST 27 50*  ALT 17 24  ALKPHOS 57 65  BILITOT 1.4* 1.1  PROT 7.2 7.7  ALBUMIN 3.3* 3.8   No results for input(s): LIPASE, AMYLASE in the last 168 hours. No results for input(s): AMMONIA in the last 168 hours. Coagulation Profile: No results for input(s): INR, PROTIME in the last 168 hours. Cardiac Enzymes: No results for input(s): CKTOTAL, CKMB, CKMBINDEX, TROPONINI in the last 168 hours. BNP (last 3 results) No results for input(s): PROBNP in the last 8760 hours. HbA1C: Recent Labs    04/05/20 1055  HGBA1C 14.2*   CBG: Recent Labs  Lab 04/04/20 1342 04/05/20 2304 04/06/20 0756 04/06/20 1202  GLUCAP 322* 377* 95 126*   Lipid Profile: Recent Labs    04/05/20 1055  CHOL 254*  HDL 66  LDLCALC 171*  TRIG 85  CHOLHDL 3.8   Thyroid Function Tests: No results for input(s): TSH, T4TOTAL, FREET4, T3FREE, THYROIDAB in the last 72 hours. Anemia Panel: No results for input(s): VITAMINB12, FOLATE, FERRITIN, TIBC, IRON, RETICCTPCT in the last 72 hours. Sepsis Labs: No results for input(s): PROCALCITON, LATICACIDVEN in the last 168 hours.  Recent Results (from the past 240 hour(s))  SARS Coronavirus 2 by RT PCR (hospital order, performed in [facility] hospital lab) Nasopharyngeal Nasopharyngeal Swab     Status: None   Collection Time: 04/04/20 10:12 AM   Specimen: Nasopharyngeal Swab  Result Value Ref Range Status   SARS Coronavirus 2 NEGATIVE NEGATIVE Final    Comment: (NOTE) SARS-CoV-2 target nucleic acids are NOT DETECTED.  The SARS-CoV-2 RNA is generally detectable in upper and lower respiratory specimens during the acute phase of infection. The lowest concentration of SARS-CoV-2 viral copies this assay can detect is 250 copies / mL. A negative result does not preclude SARS-CoV-2 infection and should not be used as the sole basis for treatment or other patient management decisions.  A negative result may  occur with improper specimen collection / handling, submission of specimen other than nasopharyngeal swab, presence of viral mutation(s) within the areas targeted by this assay, and inadequate number of viral copies (<250 copies / mL). A negative result must be combined with clinical observations, patient history, and epidemiological information.  Fact Sheet for Patients:   BoilerBrush.com.cy  Fact Sheet for Healthcare Providers: https://pope.com/  This test is not yet approved or  cleared by the [place] FDA and has been authorized for detection and/or diagnosis of SARS-CoV-2 by FDA under an Emergency Use Authorization (EUA).  This EUA will remain in effect (meaning this test can be used) for the duration of the COVID-19 declaration under Section 564(b)(1) of the Act, 21 U.S.C. section 360bbb-3(b)(1), unless the authorization is terminated or revoked  sooner.  Performed at [facility], [address], [place], [place] [address]       Radiology Studies: CT Head Wo Contrast  Result Date: 04/05/2020 CLINICAL DATA:  Trauma EXAM: CT HEAD WITHOUT CONTRAST CT CERVICAL SPINE WITHOUT CONTRAST TECHNIQUE: Multidetector CT imaging of the head and cervical spine was performed following the standard protocol without intravenous contrast. Multiplanar CT image reconstructions of the cervical spine were also generated. COMPARISON:  01/18/2015 head CT. FINDINGS: CT HEAD FINDINGS Brain: Age indeterminate focal hypodensities involving the left basal ganglia (3:14) and superior left cerebellum (7:46, new since prior exam. Additional scattered and confluent hypodense foci involving the periventricular and subcortical white matter are nonspecific however may reflect chronic microvascular ischemic changes. No mass lesion. No midline shift, ventriculomegaly or extra-axial fluid collection. Mild cerebral atrophy with ex vacuo dilatation. Vascular: No  hyperdense vessel or unexpected calcification. Skull: Negative for fracture or focal lesion. Sinuses/Orbits: Normal orbits. Clear paranasal sinuses. No mastoid effusion. Other: None. CT CERVICAL SPINE FINDINGS Alignment: Straightening of lordosis.  No listhesis. Skull base and vertebrae: No acute fracture. No primary bone lesion or focal pathologic process. Soft tissues and spinal canal: No prevertebral fluid or swelling. No visible canal hematoma. Disc levels: Multilevel spondylosis most prominent at the C5-7 levels. Patent bony spinal canal. Multilevel mild-to-moderate bony neural foraminal narrowing secondary to uncovertebral and facet hypertrophy. Upper chest: Clear lung apices. Other: None. IMPRESSION: Focal hypodensities involving the left basal ganglia and superior left cerebellum are age indeterminate. Consider MRI brain to exclude subacute insults. Mild cerebral atrophy and chronic microvascular ischemic changes. No acute fracture or traumatic listhesis.  Multilevel spondylosis. These results were called by telephone at the time of interpretation on 04/05/2020 at 10:51 am to provider [facility] , who verbally acknowledged these results. Electronically Signed   By: Stana Bunting M.D.   On: 04/05/2020 11:02   CT Cervical Spine Wo Contrast  Result Date: 04/05/2020 CLINICAL DATA:  Trauma EXAM: CT HEAD WITHOUT CONTRAST CT CERVICAL SPINE WITHOUT CONTRAST TECHNIQUE: Multidetector CT imaging of the head and cervical spine was performed following the standard protocol without intravenous contrast. Multiplanar CT image reconstructions of the cervical spine were also generated. COMPARISON:  01/18/2015 head CT. FINDINGS: CT HEAD FINDINGS Brain: Age indeterminate focal hypodensities involving the left basal ganglia (3:14) and superior left cerebellum (7:46, new since prior exam. Additional scattered and confluent hypodense foci involving the periventricular and subcortical white matter are nonspecific however  may reflect chronic microvascular ischemic changes. No mass lesion. No midline shift, ventriculomegaly or extra-axial fluid collection. Mild cerebral atrophy with ex vacuo dilatation. Vascular: No hyperdense vessel or unexpected calcification. Skull: Negative for fracture or focal lesion. Sinuses/Orbits: Normal orbits. Clear paranasal sinuses. No mastoid effusion. Other: None. CT CERVICAL SPINE FINDINGS Alignment: Straightening of lordosis.  No listhesis. Skull base and vertebrae: No acute fracture. No primary bone lesion or focal pathologic process. Soft tissues and spinal canal: No prevertebral fluid or swelling. No visible canal hematoma. Disc levels: Multilevel spondylosis most prominent at the C5-7 levels. Patent bony spinal canal. Multilevel mild-to-moderate bony neural foraminal narrowing secondary to uncovertebral and facet hypertrophy. Upper chest: Clear lung apices. Other: None. IMPRESSION: Focal hypodensities involving the left basal ganglia and superior left cerebellum are age indeterminate. Consider MRI brain to exclude subacute insults. Mild cerebral atrophy and chronic microvascular ischemic changes. No acute fracture or traumatic listhesis.  Multilevel spondylosis. These results were called by telephone at the time of interpretation on 04/05/2020 at 10:51 am  to provider [facility] , who verbally acknowledged these results. Electronically Signed   By: Stana Bunting M.D.   On: 04/05/2020 11:02   MR Brain Wo Contrast (neuro protocol)  Result Date: 04/05/2020 CLINICAL DATA:  59 year old male status post trauma, neurologic deficit with left side weakness. Age indeterminate ischemia in the left basal ganglia and left cerebellum on plain head CT earlier today. EXAM: MRI HEAD WITHOUT CONTRAST TECHNIQUE: Multiplanar, multiecho pulse sequences of the brain and surrounding structures were obtained without intravenous contrast. COMPARISON:  Head CT 1029 hours today. FINDINGS: Brain: Questionable  punctate focus of restricted diffusion at the dorsal right lentiform near the posterior limb of the right internal capsule, occurring in an area of chronic small vessel ischemia and encephalomalacia (series 5, image 80 and series 8, image 15). No other restricted diffusion. 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 Moderate patchy T2 hyperintensity in the right paracentral pons also most resembles chronic small-vessel ischemia. No cortical encephalomalacia or definite chronic blood products. No midline shift, mass effect, evidence of mass lesion, ventriculomegaly, extra-axial collection or acute intracranial hemorrhage. Cervicomedullary junction and pituitary are within normal limits. Vascular: Major intracranial vascular flow voids are preserved. Skull and upper cervical spine: Negative for age visible cervical spine, bone marrow signal. Sinuses/Orbits: Negative orbits. Paranasal sinuses and mastoids are stable and well pneumatized. Other: There is a small volume of fluid trapped in the left petrous apex (series 10, image 12) with facilitated diffusion and no complicating features. Otherwise grossly normal visible internal auditory structures. Scalp and face appear negative. IMPRESSION: 1. Suspect punctate acute on chronic lacunar infarct at the dorsal right lentiform / posterior limb of the right internal capsule. No associated hemorrhage or mass effect. 2. Underlying advanced chronic small vessel disease, with chronic ischemia in the left basal ganglia and the left cerebellum SCA territory corresponding to head CT findings earlier today. Electronically Signed   By: Odessa Fleming M.D.   On: 04/05/2020 13:44   DG Knee Complete 4 Views Left  Result Date: 04/05/2020 CLINICAL DATA:  Left knee pain after fall.  EXAM: LEFT KNEE - COMPLETE 4+ VIEW COMPARISON:  None. FINDINGS: No evidence of fracture, dislocation, or joint effusion. No evidence of arthropathy or other focal bone abnormality. Soft tissues are unremarkable. IMPRESSION: Negative. Electronically Signed   By: Lupita Raider M.D.   On: 04/05/2020 11:03   VAS US CAROTID  Result Date: 04/06/2020 Carotid Arterial Duplex Study Indications:       CVA. Risk Factors:      Prior CVA. Comparison Study:  No prior studies. Performing Technologist: Chanda Busing RVT  Examination Guidelines: A complete evaluation includes B-mode imaging, spectral Doppler, color Doppler, and power Doppler as needed of all accessible portions of each vessel. Bilateral testing is considered an integral part of a complete examination. Limited examinations for reoccurring indications may be performed as noted.  Right Carotid Findings: +----------+--------+--------+--------+-----------------------+--------+             PSV cm/s EDV cm/s Stenosis Plaque Description      Comments  +----------+--------+--------+--------+-----------------------+--------+  CCA Prox   85       17                smooth and heterogenous           +----------+--------+--------+--------+-----------------------+--------+  CCA Distal 42       14                smooth and heterogenous           +----------+--------+--------+--------+-----------------------+--------+  ICA Prox   38       16                smooth and heterogenous           +----------+--------+--------+--------+-----------------------+--------+  ICA Distal 58       21                                                  +----------+--------+--------+--------+-----------------------+--------+  ECA        87       16                                                  +----------+--------+--------+--------+-----------------------+--------+ +----------+--------+-------+--------+-------------------+             PSV cm/s EDV cms Describe Arm Pressure (mmHG)   +----------+--------+-------+--------+-------------------+  Subclavian 121                                            +----------+--------+-------+--------+-------------------+ +---------+--------+--+--------+--+---------+  Vertebral PSV cm/s 32 EDV cm/s 13 Antegrade  +---------+--------+--+--------+--+---------+  Left Carotid Findings: +----------+--------+--------+--------+-----------------------+--------+             PSV cm/s EDV cm/s Stenosis Plaque Description      Comments  +----------+--------+--------+--------+-----------------------+--------+  CCA Prox   75       15                smooth and heterogenous           +----------+--------+--------+--------+-----------------------+--------+  CCA Distal 31       8                 smooth and heterogenous           +----------+--------+--------+--------+-----------------------+--------+  ICA Prox   37       14                smooth and heterogenous           +----------+--------+--------+--------+-----------------------+--------+  ICA Distal 84       36                                                  +----------+--------+--------+--------+-----------------------+--------+  ECA        77       18                                                  +----------+--------+--------+--------+-----------------------+--------+ +----------+--------+--------+--------+-------------------+             PSV cm/s EDV cm/s Describe Arm Pressure (mmHG)  +----------+--------+--------+--------+-------------------+  Subclavian 83                                              +----------+--------+--------+--------+-------------------+ +---------+--------+--+--------+--+---------+  Vertebral PSV cm/s 46 EDV cm/s 14 Antegrade  +---------+--------+--+--------+--+---------+   Summary: Right Carotid: Velocities in the right ICA are consistent with a 1-39% stenosis. Left Carotid: Velocities in the left ICA are consistent with a 1-39% stenosis. Vertebrals: Bilateral vertebral arteries demonstrate  antegrade flow. *See table(s) above for measurements and observations.     Preliminary       Scheduled Meds:  aspirin  300 mg Rectal Daily   Or   aspirin  325 mg Oral Daily   atorvastatin  80 mg Oral Daily   enoxaparin (LOVENOX) injection  40 mg Subcutaneous Q24H   folic acid  1 mg Oral Daily   insulin aspart  0-15 Units Subcutaneous TID WC   insulin aspart  0-5 Units Subcutaneous QHS   insulin detemir  40 Units Subcutaneous QHS   multivitamin with minerals  1 tablet Oral Daily   thiamine  100 mg Oral Daily   Or   thiamine  100 mg Intravenous Daily   Continuous Infusions:    Assessment/Plan:  1.  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  Ischemic stroke versus cocaine induced.  Patient initiated on aspirin daily with statins.  Evaluated by neurology this morning and stated okay to admit [facility].  PT/OT/speech therapy evaluation pending.  He does not have significant dysarthria or facial droop as of now. Okay to eat if passes bedside swallow eval. continue neurochecks and telemetry monitoring for arrhythmias.  Other stroke work-up including CTA neck, head and echocardiogram pending.  Can likely be medically clear in a.m. if work-up unremarkable per neurology.  May need acute rehab versus SNF  2.  Bipolar disorder, situational depression with suicidal ideation/attempt: Patient endorses continued suicidal thoughts but also appears frustrated with living situation.  Discussed with psychiatry APP, Ms. Rosario AdieStarkes-Perry, who recommended to continue safety sitter, suicide precautions and they will evaluate patient in a.m. once medically cleared to determine appropriate disposition.  3.  Diabetes mellitus type 2, uncontrolled with hyperglycemia: Recent hemoglobin A1c was 11.4 in June.  Blood glucose elevated overnight, resumed  on Lantus 40 units nightly.  Patient also on glipizide, Premeal insulin at home which are held currently.  Titrate as needed per oral intake and blood glucose levels.  4.  Recurrent falls: In the setting of problem #1.  No acute injuries except for small abrasion along the knee.  PT evaluation pending  5.  Hyperlipidemia: Continue statins.  6.  Polysubstance abuse: He has longstanding history of cocaine, alcohol abuse.  Urine drug screen positive for cocaine.  Likely contributing to problem #1 and problem #2.  Will need substance abuse referrals, await psychiatry input  DVT prophylaxis: Lovenox Code Status: Full code Family / Patient Communication: Discussed with patient, family not present at bedside Disposition Plan:   Status is: Inpatient  Remains inpatient appropriate because:Unsafe d/c plan and Inpatient level of care appropriate due to severity of illness   Dispo: The patient is from: Home              Anticipated d/c is to: To be decided  Anticipated d/c date is: 3 days              Patient currently is not medically stable to d/c.           Time spent: 35 minutes     >50% time spent in discussions with care team and coordination of care.    Alessandra Bevels, MD [facility] Pager in [place]  If 7PM-7AM, please contact night-coverage [email or web address] 04/06/2020, 12:45 PM

## 2020-04-06 NOTE — ED Notes (Signed)
Report attempted. No answer with call attempt. Will try again.

## 2020-04-06 NOTE — Progress Notes (Signed)
Inpatient Diabetes Program Recommendations  AACE/ADA: New Consensus Statement on Inpatient Glycemic Control (2015)  Target Ranges:  Prepandial:   less than 140 mg/dL      Peak postprandial:   less than 180 mg/dL (1-2 hours)      Critically ill patients:  140 - 180 mg/dL   Lab Results  Component Value Date   GLUCAP 95 04/06/2020   HGBA1C 14.2 (H) 04/05/2020    Review of Glycemic Control  Diabetes history: DM 2 Outpatient Diabetes medications: Levemir 40 units, Humalog 10 units tid, Metformin 1000 mg bid Current orders for Inpatient glycemic control:  Levemir 40 units qhs Novolog 0-15 units tid + hs  A1c 14.2% on 9/9  9/1 ED visit to [facility] with hyperglycemia (only on orals at that time) 9/8 ED visit was on Levemir at that time. Newly started on insulin, however relapsed on cocaine, ETOH, and reports SI.  Will not educate at this time as pt  started new regimen and said he was compliant with medications. Education more appropriate outpatient at this time.   Inpatient Diabetes Program Recommendations:    - Please add ambulatory referral for DM education for pt when education will be more appropriate  Called pt in room to discuss A1c level of 14.2%. Pt reports being in the area for some time and taking his medications as prescribed. Pt reports being on insulin for awhile but does not have a glucose meter to know what his levels are. Discussed current A1c level. Discussed glucose and A1c goals. Discussed importance of glucose control. Pt has all needed supplies at home except for glucometer.   D/C: Blood glucose meter kit order # [id number]   Thanks,  Tama Headings RN, MSN, BC-ADM Inpatient Diabetes Coordinator Team Pager [phone] (8a-5p)

## 2020-04-06 NOTE — Progress Notes (Signed)
PT Cancellation Note  Patient Details Name: [name] MRN: [id number] DOB: 1960/08/01   Cancelled Treatment:     Held PT assessment in [facility] ED at this time noted pt continued work up for CVA and order to transfer to [facility] 04/06/2020, 3:23 PM

## 2020-04-07 DIAGNOSIS — F250 Schizoaffective disorder, bipolar type: Secondary | ICD-10-CM

## 2020-04-07 LAB — GLUCOSE, CAPILLARY
Glucose-Capillary: 251 mg/dL — ABNORMAL HIGH (ref 70–99)
Glucose-Capillary: 208 mg/dL — ABNORMAL HIGH (ref 70–99)
Glucose-Capillary: 157 mg/dL — ABNORMAL HIGH (ref 70–99)
Glucose-Capillary: 278 mg/dL — ABNORMAL HIGH (ref 70–99)
Glucose-Capillary: 246 mg/dL — ABNORMAL HIGH (ref 70–99)

## 2020-04-07 MED ORDER — TRAZODONE HCL 100 MG PO TABS
100.0000 mg | ORAL_TABLET | Freq: Every day | ORAL | Status: DC
  Administered 2020-04-07 – 2020-04-17 (×11): 100 mg via ORAL
  Filled 2020-04-07 (×11): qty 1

## 2020-04-07 MED ORDER — CLOPIDOGREL BISULFATE 75 MG PO TABS
75.0000 mg | ORAL_TABLET | Freq: Every day | ORAL | Status: DC
  Administered 2020-04-07 – 2020-04-18 (×12): 75 mg via ORAL
  Filled 2020-04-07 (×12): qty 1

## 2020-04-07 MED ORDER — ARIPIPRAZOLE 5 MG PO TABS
5.0000 mg | ORAL_TABLET | Freq: Every day | ORAL | Status: DC
  Administered 2020-04-07 – 2020-04-13 (×7): 5 mg via ORAL
  Filled 2020-04-07 (×7): qty 1

## 2020-04-07 NOTE — Consult Note (Signed)
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 male patient admitted due to frequent falls and suicide attempt by putting a gun to his head, pulled the trigger but it jammed.  HPI:  Patient with history of diabetes mellitus type 2, hepatitis C, Schizoaffective disorder-depressed type, Cocaine use disorder, Stroke with left limbs weakness who was admitted to the hospital due to frequent falls and suicide attempt. Patient reports that life has been difficult for him since he had Stroke about 6 months ago due to excessive Cocaine use. He has become more depressed, stressed, easily overwhelmed and falling more frequently due to lower limbs weakness secondary to stroke. Today, he denies psychosis, delusions but reports ongoing depression and suicidal thoughts with no plan. Patient reports long history of cocaine use but has been trying to cut down after he has Stroke.   Past Psychiatric History: as above  Risk to Self: Is patient at risk for suicide?: Yes Risk to Others:  denies Prior Inpatient Therapy:   Yes at [facility] in [place] Prior Outpatient Therapy:  yes  Past Medical History:  Past Medical History:  Diagnosis Date  . Anxiety   . Bipolar affective disorder (HCC)   . Bipolar disorder (HCC) 03/11/2012  . Depression   . Diabetes mellitus   . Diabetes mellitus (HCC) 03/11/2012  . Hepatitis 06/30/2013   Type C  . Hypertension   .  Schizophrenia, schizo-affective (HCC)     Past Surgical History:  Procedure Laterality Date  . FINGER SURGERY    . WISDOM TOOTH EXTRACTION     Family History:  Family History  Problem Relation Age of Onset  . Diabetes Mother   . Bipolar disorder Sister    Family Psychiatric  History:  Social History:  Social History   Substance and Sexual Activity  Alcohol Use No     Social History   Substance and Sexual Activity  Drug Use Yes  . Types: Marijuana, Cocaine    Social History   Socioeconomic History  . Marital status: Single    Spouse name: Not on file  . Number of children: Not on file  . Years of education: Not on file  . Highest education level: Not on file  Occupational History  . Not on file  Tobacco Use  . Smoking status: Never Smoker  . Smokeless tobacco: Never Used  Vaping Use  . Vaping Use: Never used  Substance and Sexual Activity  . Alcohol use: No  . Drug use: Yes    Types: Marijuana, Cocaine  . Sexual activity: Yes    Birth control/protection: Condom    Comment: marijuana 25 days ago  Other Topics Concern  . Not on file  Social History Narrative  . Not on file   Social Determinants of Health   Financial Resource Strain:   . Difficulty of Paying Living Expenses: Not on file  Food Insecurity:   . Worried About Programme researcher, broadcasting/film/video in the Last Year: Not on file  .  Ran Out of Food in the Last Year: Not on file  Transportation Needs:   . Lack of Transportation (Medical): Not on file  . Lack of Transportation (Non-Medical): Not on file  Physical Activity:   . Days of Exercise per Week: Not on file  . Minutes of Exercise per Session: Not on file  Stress:   . Feeling of Stress : Not on file  Social Connections:   . Frequency of Communication with Friends and Family: Not on file  . Frequency of Social Gatherings with Friends and Family: Not on file  . Attends Religious Services: Not on file  . Active Member of Clubs or Organizations: Not on file   . Attends Banker Meetings: Not on file  . Marital Status: Not on file   Additional Social History:    Allergies:   Allergies  Allergen Reactions  . Wellbutrin [Bupropion] Hives and Swelling    Labs:  Results for orders placed or performed during the hospital encounter of 04/05/20 (from the past 48 hour(s))  HIV Antibody (routine testing w rflx)     Status: None   Collection Time: 04/05/20  3:36 PM  Result Value Ref Range   HIV Screen 4th Generation wRfx Non Reactive Non Reactive    Comment: Performed at [facility], [address] [place], [place] [address]  Urinalysis, Routine w reflex microscopic Urine, Clean Catch     Status: Abnormal   Collection Time: 04/05/20 10:00 PM  Result Value Ref Range   Color, Urine YELLOW YELLOW   APPearance CLEAR CLEAR   Specific Gravity, Urine 1.020 1.005 - 1.030   pH 5.0 5.0 - 8.0   Glucose, UA >=500 (A) NEGATIVE mg/dL   Hgb urine dipstick NEGATIVE NEGATIVE   Bilirubin Urine NEGATIVE NEGATIVE   Ketones, ur NEGATIVE NEGATIVE mg/dL   Protein, ur NEGATIVE NEGATIVE mg/dL   Nitrite NEGATIVE NEGATIVE   Leukocytes,Ua NEGATIVE NEGATIVE   Bacteria, UA NONE SEEN NONE SEEN   Mucus PRESENT     Comment: Performed at [facility], [address] [place], [place] [address]  Urine rapid drug screen (hosp performed)     Status: Abnormal   Collection Time: 04/05/20 10:00 PM  Result Value Ref Range   Opiates NONE DETECTED NONE DETECTED   Cocaine POSITIVE (A) NONE DETECTED   Benzodiazepines NONE DETECTED NONE DETECTED   Amphetamines NONE DETECTED NONE DETECTED   Tetrahydrocannabinol NONE DETECTED NONE DETECTED   Barbiturates NONE DETECTED NONE DETECTED    Comment: (NOTE) DRUG SCREEN FOR MEDICAL PURPOSES ONLY.  IF CONFIRMATION IS NEEDED FOR ANY PURPOSE, NOTIFY LAB WITHIN 5 DAYS.  LOWEST DETECTABLE LIMITS FOR URINE DRUG SCREEN Drug Class                     Cutoff (ng/mL) Amphetamine and metabolites     1000 Barbiturate and metabolites    200 Benzodiazepine                 200 Tricyclics and metabolites     300 Opiates and metabolites        300 Cocaine and metabolites        300 THC                            50 Performed at [facility], [address] [place], [place] [address]   CBG monitoring, ED     Status:  Abnormal   Collection Time: 04/05/20 11:04 PM  Result Value Ref Range   Glucose-Capillary 377 (H) 70 - 99 mg/dL    Comment: Glucose reference range applies only to samples taken after fasting for at least 8 hours.  CBC     Status: Abnormal   Collection Time: 04/06/20  4:03 AM  Result Value Ref Range   WBC 5.5 4.0 - 10.5 K/uL   RBC 3.58 (L) 4.22 - 5.81 MIL/uL   Hemoglobin 10.5 (L) 13.0 - 17.0 g/dL   HCT 16.130.5 (L) 39 - 52 %   MCV 85.2 80.0 - 100.0 fL   MCH 29.3 26.0 - 34.0 pg   MCHC 34.4 30.0 - 36.0 g/dL   RDW 09.611.9 04.511.5 - 40.915.5 %   Platelets 201 150 - 400 K/uL   nRBC 0.0 0.0 - 0.2 %    Comment: Performed at [facility], [address] [place], [place] [address]  Basic metabolic panel     Status: Abnormal   Collection Time: 04/06/20  4:03 AM  Result Value Ref Range   Sodium 137 135 - 145 mmol/L   Potassium 3.3 (L) 3.5 - 5.1 mmol/L    Comment: DELTA CHECK NOTED   Chloride 105 98 - 111 mmol/L   CO2 24 22 - 32 mmol/L   Glucose, Bld 116 (H) 70 - 99 mg/dL    Comment: Glucose reference range applies only to samples taken after fasting for at least 8 hours.   BUN 20 6 - 20 mg/dL   Creatinine, Ser 4.780.91 0.61 - 1.24 mg/dL   Calcium 8.6 (L) 8.9 - 10.3 mg/dL   GFR calc non Af Amer >60 >60 mL/min   GFR calc Af Amer >60 >60 mL/min   Anion gap 8 5 - 15    Comment: Performed at [facility], [address] [place], [place] [address]  Magnesium     Status: Abnormal   Collection Time: 04/06/20  4:03 AM  Result Value Ref Range   Magnesium 1.4 (L) 1.7 - 2.4 mg/dL    Comment: Performed at [facility],  [address] [place], [place] [address]  Phosphorus     Status: None   Collection Time: 04/06/20  4:03 AM  Result Value Ref Range   Phosphorus 3.7 2.5 - 4.6 mg/dL    Comment: Performed at [facility], [address] [place], [place] [address]  CBG monitoring, ED     Status: None   Collection Time: 04/06/20  7:56 AM  Result Value Ref Range   Glucose-Capillary 95 70 - 99 mg/dL    Comment: Glucose reference range applies only to samples taken after fasting for at least 8 hours.  CBG monitoring, ED     Status: Abnormal   Collection Time: 04/06/20 12:02 PM  Result Value Ref Range   Glucose-Capillary 126 (H) 70 - 99 mg/dL    Comment: Glucose reference range applies only to samples taken after fasting for at least 8 hours.   Comment 1 Notify RN    Comment 2 Document in Chart   CBG monitoring, ED     Status: Abnormal   Collection Time: 04/06/20  4:38 PM  Result Value Ref Range   Glucose-Capillary 125 (H) 70 - 99 mg/dL    Comment: Glucose reference range applies only to samples taken after fasting for at least 8 hours.  SARS Coronavirus 2 by RT PCR (hospital order, performed in [facility] hospital lab) Nasopharyngeal  Nasopharyngeal Swab     Status: None   Collection Time: 04/06/20  5:54 PM   Specimen: Nasopharyngeal Swab  Result Value Ref Range   SARS Coronavirus 2 NEGATIVE NEGATIVE    Comment: (NOTE) SARS-CoV-2 target nucleic acids are NOT DETECTED.  The SARS-CoV-2 RNA is generally detectable in upper and lower respiratory specimens during the acute phase of infection. The lowest concentration of SARS-CoV-2 viral copies this assay can detect is 250 copies / mL. A negative result does not preclude SARS-CoV-2 infection and should not be used as the sole basis for treatment or other patient management decisions.  A negative result may occur with improper specimen collection / handling, submission of specimen other than nasopharyngeal swab, presence of viral  mutation(s) within the areas targeted by this assay, and inadequate number of viral copies (<250 copies / mL). A negative result must be combined with clinical observations, patient history, and epidemiological information.  Fact Sheet for Patients:   BoilerBrush.com.cy  Fact Sheet for Healthcare Providers: https://pope.com/  This test is not yet approved or  cleared by the [place] FDA and has been authorized for detection and/or diagnosis of SARS-CoV-2 by FDA under an Emergency Use Authorization (EUA).  This EUA will remain in effect (meaning this test can be used) for the duration of the COVID-19 declaration under Section 564(b)(1) of the Act, 21 U.S.C. section 360bbb-3(b)(1), unless the authorization is terminated or revoked sooner.  Performed at [facility], [address], [place], [place] [address]   Glucose, capillary     Status: Abnormal   Collection Time: 04/06/20  9:20 PM  Result Value Ref Range   Glucose-Capillary 304 (H) 70 - 99 mg/dL    Comment: Glucose reference range applies only to samples taken after fasting for at least 8 hours.  Glucose, capillary     Status: Abnormal   Collection Time: 04/07/20  7:34 AM  Result Value Ref Range   Glucose-Capillary 251 (H) 70 - 99 mg/dL    Comment: Glucose reference range applies only to samples taken after fasting for at least 8 hours.  Glucose, capillary     Status: Abnormal   Collection Time: 04/07/20  9:23 AM  Result Value Ref Range   Glucose-Capillary 208 (H) 70 - 99 mg/dL    Comment: Glucose reference range applies only to samples taken after fasting for at least 8 hours.  Glucose, capillary     Status: Abnormal   Collection Time: 04/07/20 11:53 AM  Result Value Ref Range   Glucose-Capillary 157 (H) 70 - 99 mg/dL    Comment: Glucose reference range applies only to samples taken after fasting for at least 8 hours.    Current  Facility-Administered Medications  Medication Dose Route Frequency Provider Last Rate Last Admin  . acetaminophen (TYLENOL) tablet 650 mg  650 mg Oral Q4H PRN Madelyn Flavors A, MD   650 mg at 04/07/20 1016   Or  . acetaminophen (TYLENOL) 160 MG/5ML solution 650 mg  650 mg Per Tube Q4H PRN Madelyn Flavors A, MD       Or  . acetaminophen (TYLENOL) suppository 650 mg  650 mg Rectal Q4H PRN Smith, Rondell A, MD      . ARIPiprazole (ABILIFY) tablet 5 mg  5 mg Oral Daily Hearl Heikes, MD      . aspirin suppository 300 mg  300 mg Rectal Daily Smith, Rondell A, MD       Or  . aspirin tablet 325 mg  325  mg Oral Daily Madelyn Flavors A, MD   325 mg at 04/07/20 0945  . atorvastatin (LIPITOR) tablet 80 mg  80 mg Oral Daily Katrinka Blazing, Rondell A, MD   80 mg at 04/07/20 0945  . clopidogrel (PLAVIX) tablet 75 mg  75 mg Oral Daily Glade Lloyd, MD   75 mg at 04/07/20 0945  . enoxaparin (LOVENOX) injection 40 mg  40 mg Subcutaneous Q24H Katrinka Blazing, Rondell A, MD   40 mg at 04/06/20 1857  . folic acid (FOLVITE) tablet 1 mg  1 mg Oral Daily Katrinka Blazing, Rondell A, MD   1 mg at 04/07/20 0945  . insulin aspart (novoLOG) injection 0-15 Units  0-15 Units Subcutaneous TID WC Clydie Braun, MD   5 Units at 04/07/20 0944  . insulin aspart (novoLOG) injection 0-5 Units  0-5 Units Subcutaneous QHS Clydie Braun, MD   4 Units at 04/06/20 2140  . insulin detemir (LEVEMIR) injection 40 Units  40 Units Subcutaneous QHS Clydie Braun, MD   40 Units at 04/06/20 2140  . LORazepam (ATIVAN) tablet 1-4 mg  1-4 mg Oral Q1H PRN Clydie Braun, MD       Or  . LORazepam (ATIVAN) injection 1-4 mg  1-4 mg Intravenous Q1H PRN Madelyn Flavors A, MD      . multivitamin with minerals tablet 1 tablet  1 tablet Oral Daily Madelyn Flavors A, MD   1 tablet at 04/07/20 0945  . senna-docusate (Senokot-S) tablet 1 tablet  1 tablet Oral QHS PRN Madelyn Flavors A, MD      . thiamine tablet 100 mg  100 mg Oral Daily Katrinka Blazing, Rondell A, MD   100 mg at  04/07/20 0945   Or  . thiamine (B-1) injection 100 mg  100 mg Intravenous Daily Smith, Rondell A, MD      . traZODone (DESYREL) tablet 100 mg  100 mg Oral QHS Thedore Mins, MD        Musculoskeletal: Strength & Muscle Tone: not tested Gait & Station: not tested Patient leans: N/A  Psychiatric Specialty Exam: Physical Exam Psychiatric:        Attention and Perception: Attention normal.        Mood and Affect: Mood is depressed. Affect is flat.        Speech: Speech normal.        Behavior: Behavior is cooperative.        Thought Content: Thought content includes suicidal ideation.        Cognition and Memory: Cognition normal.        Judgment: Judgment is impulsive.     Review of Systems  Constitutional: Positive for fatigue.  HENT: Negative.   Eyes: Negative.   Respiratory: Negative.   Cardiovascular: Negative.   Gastrointestinal: Negative.   Endocrine: Negative.   Neurological: Positive for weakness.  Psychiatric/Behavioral: Positive for dysphoric mood and suicidal ideas.    Blood pressure 125/83, pulse 62, temperature 98.1 F (36.7 C), temperature source Oral, resp. rate 19, height 5\' 9"  (1.753 m), weight 75.3 kg, SpO2 100 %.Body mass index is 24.51 kg/m.  General Appearance: Casual  Eye Contact:  Good  Speech:  Clear and Coherent and Slow  Volume:  Decreased  Mood:  Dysphoric  Affect:  Constricted  Thought Process:  Coherent and Linear  Orientation:  Full (Time, Place, and Person)  Thought Content:  Logical  Suicidal Thoughts:  Yes.  without intent/plan  Homicidal Thoughts:  No  Memory:  Immediate;   Good Recent;  Good Remote;   Good  Judgement:  Poor  Insight:  Shallow  Psychomotor Activity:  Psychomotor Retardation  Concentration:  Concentration: Fair and Attention Span: Fair  Recall:  Good  Fund of Knowledge:  Good  Language:  Good  Akathisia:  No  Handed:  Right  AIMS (if indicated):     Assets:  Communication Skills Desire for Improvement   ADL's:marginal following stroke  Cognition:  WNL  Sleep:        Treatment Plan Summary: 59 year old male who was admitted to the hospital due to frequent falls at home secondary to Stroke and suicide attempt by putting gun to his head. Patient will benefit from inpatient psychiatric admission for stabilization after he is medically stabilized.  Recommendations: -Continue 1:1 sitter for safety -Consider Abilify 5 mg daily for mood, Trazodone 100 mg at bedtime for depression/sleep -Consider social worker consult to facilitate inpatient psychiatric admission. -Consider putting patient on IVC if he refuses voluntary psychiatric admission.  Disposition: Recommend psychiatric Inpatient admission when medically cleared. Supportive therapy provided about ongoing stressors. Psychiatric service siging out. Re-consult as needed  Thedore Mins, MD 04/07/2020 12:48 PM

## 2020-04-07 NOTE — Progress Notes (Signed)
Patient ID: [name], male   DOB: [birth year], 59 y.o.   MRN: [id number]  PROGRESS NOTE    [name]  UYQ:[id number] DOB: April 10, 1961 DOA: 04/05/2020 PCP: System, Provider Not In   Brief Narrative:  59 year old male with history of diabetes mellitus type 2, hepatitis C, bipolar disorder presented with suicidal ideations and falls.  He reportedly attempted to place a gun to his head and pulled the trigger, but it was jammed.  Patient did admit to recently using cocaine and alcohol 6 days ago.  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  Neurology/psychiatry were consulted.  Assessment & Plan:   Acute on chronic lacunar infarct in the dorsal right lentiform/posterior limb of the right Internal capsule: Ischemic stroke versus cocaine induced -Presented with recurrent falls and ongoing left-sided weakness for months -Neurology following: Spoke to Dr. Arora/neurology on phone on 04/07/2020 and he recommends aspirin and Plavix for 3 months followed by aspirin alone along with Lipitor 80 mg daily -PT/OT/SLP evaluation pending -LDL 171.  Hemoglobin A1c 14.2 -Carotid ultrasound showed bilateral 1 to 39% ICA stenosis.  2D echo showed EF of 55 to 60% with grade 2 diastolic dysfunction -Neurology has signed off.  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  Outpatient follow-up  Two mm vascular protrusion arising from RICA/CCA bifurcation-infundibulum versus small aneurysm.    Outpatient follow-up with repeat CTA in a year as per neurology  Bipolar disorder with suicidal ideation/attempt -Psychiatry evaluation pending.  Continue Recruitment consultant.  Suicide precautions.  Diabetes mellitus type 2 uncontrolled with  hyperglycemia -A1c 14.2.  Continue Levemir along with CBGs with SSI.  Recurrent falls -PT eval pending.  No acute injuries except for small abrasion along the knee  Hyperlipidemia -Statin plan as above  Polysubstance abuse -Longstanding history of cocaine/alcohol abuse.  Urine drug screen was positive for cocaine.  Social worker consult.  DVT prophylaxis: Lovenox Code Status: Full Family Communication: Patient at bedside Disposition Plan: Status is: Inpatient  Remains inpatient appropriate because:Inpatient level of care appropriate due to severity of illness.  Pending psychiatry evaluation, will probably need inpatient psychiatric hospitalization.  Patient is currently medically stable for discharge once evaluation by PT has been completed.   Dispo: The patient is from: Home              Anticipated d/c is to: [facility]              Anticipated d/c date is: 1 day              Patient currently is medically stable to d/c.    Consultants: Neurology/psychiatry  Procedures: Echo/carotid ultrasound as above  Antimicrobials: None   Subjective: Patient seen and examined at bedside.  Sitter present at bedside as well.  Poor historian.  States that he has headache and does not feel well.  No overnight fever, vomiting reported. Objective: Vitals:   04/06/20 2108 04/07/20 0206 04/07/20 0512 04/07/20 1000  BP: 132/78 (!) 141/73 135/76 125/83  Pulse: 71 68 79 62  Resp: 18 18 18 19   Temp: 98.9 F (37.2 C) 98 F (36.7 C) 97.8 F (36.6 C) 98.1 F (36.7 C)  TempSrc: Oral Oral Oral Oral  SpO2: 100% 98% 99% 100%  Weight:      Height:  Intake/Output Summary (Last 24 hours) at 04/07/2020 1034 Last data filed at 04/07/2020 0630 Gross per 24 hour  Intake 2280 ml  Output 1630 ml  Net 650 ml   Filed Weights   04/05/20 0942 04/06/20 1817  Weight: 79.4 kg 75.3 kg    Examination:  General exam: Looks chronically ill.  Does not communicate much.  Poor historian.  Respiratory  system: Bilateral decreased breath sounds at bases Cardiovascular system: S1 & S2 heard, Rate controlled Gastrointestinal system: Abdomen is nondistended, soft and nontender. Normal bowel sounds heard. Extremities: No cyanosis, clubbing; trace lower extremity edema Central nervous system: Awake, does not participate in conversation much.  No focal neurological deficits. Moving extremities Skin: No rashes, lesions or ulcers Psychiatry: Flat affect    Data Reviewed: I have personally reviewed following labs and imaging studies  CBC: Recent Labs  Lab 04/04/20 0721 04/05/20 1055 04/06/20 0403  WBC 11.2* 8.2 5.5  NEUTROABS  --  5.5  --   HGB 12.8* 13.0 10.5*  HCT 38.6* 41.2 30.5*  MCV 86.2 90.4 85.2  PLT 245 204 201   Basic Metabolic Panel: Recent Labs  Lab 04/04/20 0721 04/05/20 1055 04/06/20 0403  NA 136 135 137  K 4.5 4.2 3.3*  CL 101 99 105  CO2 20* 23 24  GLUCOSE 397* 170* 116*  BUN 22* 25* 20  CREATININE 1.27* 1.07 0.91  CALCIUM 10.2 9.5 8.6*  MG  --   --  1.4*  PHOS  --   --  3.7   GFR: Estimated Creatinine Clearance: 87.4 mL/min (by C-G formula based on SCr of 0.91 mg/dL). Liver Function Tests: Recent Labs  Lab 04/04/20 1133 04/05/20 1055  AST 27 50*  ALT 17 24  ALKPHOS 57 65  BILITOT 1.4* 1.1  PROT 7.2 7.7  ALBUMIN 3.3* 3.8   No results for input(s): LIPASE, AMYLASE in the last 168 hours. No results for input(s): AMMONIA in the last 168 hours. Coagulation Profile: No results for input(s): INR, PROTIME in the last 168 hours. Cardiac Enzymes: No results for input(s): CKTOTAL, CKMB, CKMBINDEX, TROPONINI in the last 168 hours. BNP (last 3 results) No results for input(s): PROBNP in the last 8760 hours. HbA1C: Recent Labs    04/05/20 1055  HGBA1C 14.2*   CBG: Recent Labs  Lab 04/06/20 1202 04/06/20 1638 04/06/20 2120 04/07/20 0734 04/07/20 0923  GLUCAP 126* 125* 304* 251* 208*   Lipid Profile: Recent Labs    04/05/20 1055  CHOL 254*   HDL 66  LDLCALC 171*  TRIG 85  CHOLHDL 3.8   Thyroid Function Tests: No results for input(s): TSH, T4TOTAL, FREET4, T3FREE, THYROIDAB in the last 72 hours. Anemia Panel: No results for input(s): VITAMINB12, FOLATE, FERRITIN, TIBC, IRON, RETICCTPCT in the last 72 hours. Sepsis Labs: No results for input(s): PROCALCITON, LATICACIDVEN in the last 168 hours.  Recent Results (from the past 240 hour(s))  SARS Coronavirus 2 by RT PCR (hospital order, performed in [facility] hospital lab) Nasopharyngeal Nasopharyngeal Swab     Status: None   Collection Time: 04/04/20 10:12 AM   Specimen: Nasopharyngeal Swab  Result Value Ref Range Status   SARS Coronavirus 2 NEGATIVE NEGATIVE Final    Comment: (NOTE) SARS-CoV-2 target nucleic acids are NOT DETECTED.  The SARS-CoV-2 RNA is generally detectable in upper and lower respiratory specimens during the acute phase of infection. The lowest concentration of SARS-CoV-2 viral copies this assay can detect is 250 copies / mL. A negative result does not preclude  SARS-CoV-2 infection and should not be used as the sole basis for treatment or other patient management decisions.  A negative result may occur with improper specimen collection / handling, submission of specimen other than nasopharyngeal swab, presence of viral mutation(s) within the areas targeted by this assay, and inadequate number of viral copies (<250 copies / mL). A negative result must be combined with clinical observations, patient history, and epidemiological information.  Fact Sheet for Patients:   BoilerBrush.com.cy  Fact Sheet for Healthcare Providers: https://pope.com/  This test is not yet approved or  cleared by the [place] FDA and has been authorized for detection and/or diagnosis of SARS-CoV-2 by FDA under an Emergency Use Authorization (EUA).  This EUA will remain in effect (meaning this test can be used) for the  duration of the COVID-19 declaration under Section 564(b)(1) of the Act, 21 U.S.C. section 360bbb-3(b)(1), unless the authorization is terminated or revoked sooner.  Performed at [facility], [address], [place], [place] [address]   SARS Coronavirus 2 by RT PCR (hospital order, performed in [facility] hospital lab) Nasopharyngeal Nasopharyngeal Swab     Status: None   Collection Time: 04/06/20  5:54 PM   Specimen: Nasopharyngeal Swab  Result Value Ref Range Status   SARS Coronavirus 2 NEGATIVE NEGATIVE Final    Comment: (NOTE) SARS-CoV-2 target nucleic acids are NOT DETECTED.  The SARS-CoV-2 RNA is generally detectable in upper and lower respiratory specimens during the acute phase of infection. The lowest concentration of SARS-CoV-2 viral copies this assay can detect is 250 copies / mL. A negative result does not preclude SARS-CoV-2 infection and should not be used as the sole basis for treatment or other patient management decisions.  A negative result may occur with improper specimen collection / handling, submission of specimen other than nasopharyngeal swab, presence of viral mutation(s) within the areas targeted by this assay, and inadequate number of viral copies (<250 copies / mL). A negative result must be combined with clinical observations, patient history, and epidemiological information.  Fact Sheet for Patients:   BoilerBrush.com.cy  Fact Sheet for Healthcare Providers: https://pope.com/  This test is not yet approved or  cleared by the [place] FDA and has been authorized for detection and/or diagnosis of SARS-CoV-2 by FDA under an Emergency Use Authorization (EUA).  This EUA will remain in effect (meaning this test can be used) for the duration of the COVID-19 declaration under Section 564(b)(1) of the Act, 21 U.S.C. section 360bbb-3(b)(1), unless the authorization is terminated or revoked  sooner.  Performed at [facility], [address], [place], [place] [address]          Radiology Studies: CT ANGIO HEAD W OR WO CONTRAST  Result Date: 04/06/2020 CLINICAL DATA:  Stroke/TIA, assess extracranial arteries, right internal capsule stroke. EXAM: CT ANGIOGRAPHY HEAD AND NECK TECHNIQUE: Multidetector CT imaging of the head and neck was performed using the standard protocol during bolus administration of intravenous contrast. Multiplanar CT image reconstructions and MIPs were obtained to evaluate the vascular anatomy. Carotid stenosis measurements (when applicable) are obtained utilizing NASCET criteria, using the distal internal carotid diameter as the denominator. CONTRAST:  OMNIPAQUE IOHEXOL 350 MG/ML SOLN COMPARISON:  Brain MRI performed earlier the same day 04/05/2020, prior head CT examinations 04/05/2020 and earlier. FINDINGS: CT HEAD FINDINGS Brain: A punctate acute infarct within the right basal ganglia/posterior limb of right internal capsule was better appreciated on the brain MRI performed earlier the same day. Advanced  ill-defined hypoattenuation within the cerebral white matter consistent with chronic small vessel ischemic disease. Redemonstrated chronic lacunar infarcts within the bilateral corona radiata/basal ganglia and right thalamus. Also redemonstrated is a small chronic infarct within the left cerebellar hemisphere. There is no acute intracranial hemorrhage. No demarcated cortical infarct is identified. No extra-axial fluid collection. No evidence of intracranial mass. No midline shift. Vascular: Reported below. Skull: Normal. Negative for fracture or focal lesion. Sinuses: Minimal ethmoid and maxillary sinus mucosal thickening. No significant mastoid effusion. Orbits: No acute finding. Review of the MIP images confirms the above findings CTA NECK FINDINGS Aortic arch: Common origin of the innominate and left common carotid arteries.  Atherosclerotic plaque within the visualized aortic arch. No hemodynamically significant innominate or proximal subclavian artery stenosis. Right carotid system: CCA and ICA patent within the neck without significant stenosis (50% or greater). Minimal mixed plaque within the prox ICA. Left carotid system: CCA and ICA patent within the neck without significant stenosis (50% or greater). Mild mixed plaque within the carotid bifurcation and proximal ICA. This includes a small focus of ulcerated plaque along the posterior aspect of the carotid bulb (series 14, image 131) Vertebral arteries: Patent bilaterally without significant stenosis. The left vertebral artery is slightly dominant. Skeleton: No acute bony abnormality or aggressive osseous lesion. Cervical spondylosis with multilevel disc space narrowing, posterior disc osteophytes, uncovertebral and facet hypertrophy. Additionally, there are multilevel prominent ventral osteophytes most notably at the C5-C6 and C6-C7 levels. Other neck: No neck mass or cervical lymphadenopathy. Upper chest: No consolidation within the imaged lung apices. Review of the MIP images confirms the above findings CTA HEAD FINDINGS Anterior circulation: The intracranial internal carotid arteries are patent. Minimal calcified plaque within the intracranial left ICA without stenosis. The M1 middle cerebral arteries are patent without significant stenosis. Mild-to-moderate segmental narrowing of the proximal to mid M1 left MCA (series 15, image 50). No M2 proximal branch occlusion is identified. There are high-grade focal stenoses within multiple distal M2 left MCA branch vessels (for instance as seen on series 17, image 28). The anterior cerebral arteries are patent. Moderate/severe segmental stenosis within the A2 right anterior cerebral artery (series 17, image 22). 2 mm inferiorly projecting vascular protrusion arising from the paraclinoid right ICA, which may reflect an infundibulum or  small aneurysm (series 14, image 87). Posterior circulation: The intracranial vertebral arteries are patent. The basilar artery is patent. Fetal origin left posterior cerebral artery. The posterior cerebral arteries are patent bilaterally. There is a moderate focal stenosis within the P1 right PCA. Severe focal stenosis within the proximal P2 right PCA. Moderate/severe focal stenoses within the left posterior communicating artery and P2 left PCA (series 15, image 50). The right posterior communicating artery is hypoplastic or absent. Venous sinuses: Within limitations of contrast timing, no convincing thrombus. Anatomic variants: As described Review of the MIP images confirms the above findings IMPRESSION: CT head: 1. A punctate acute infarct within the right basal ganglia/posterior limb of right internal capsule was better appreciated on the brain MRI performed earlier the same day. 2. 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 3. Redemonstrated small chronic infarct within the left cerebellum. CTA neck: The common carotid, internal carotid and vertebral arteries are patent within the neck without hemodynamically significant stenosis. Mild calcified plaque within the carotid bifurcations/proximal ICAs bilaterally. This includes a focus of ulcerated plaque within the posterior aspect of the left  carotid bulb. CTA head: 1. No intracranial large vessel occlusion. 2. Intracranial atherosclerotic disease with multifocal stenoses, most notably as follows. 3. Mild-to-moderate segmental narrowing of the proximal to mid M1 left MCA. 4. High-grade focal stenoses within multiple distal M2 left MCA branch vessels. 5. Moderate/severe segmental stenosis within the A2 right anterior cerebral artery. 6. Moderate/severe focal stenosis within the P1 right PCA 7. Severe focal stenosis within  the P2 right PCA. 8. The left PCA is fetal in origin. Moderate/severe focal stenoses within the left posterior communicating artery and P2 left PCA. 9. 2 mm vascular protrusion arising from the paraclinoid right ICA which may reflect an infundibulum or small aneurysm. Electronically Signed   By: Jackey Loge DO   On: 04/06/2020 14:34   CT Head Wo Contrast  Result Date: 04/05/2020 CLINICAL DATA:  Trauma EXAM: CT HEAD WITHOUT CONTRAST CT CERVICAL SPINE WITHOUT CONTRAST TECHNIQUE: Multidetector CT imaging of the head and cervical spine was performed following the standard protocol without intravenous contrast. Multiplanar CT image reconstructions of the cervical spine were also generated. COMPARISON:  01/18/2015 head CT. FINDINGS: CT HEAD FINDINGS Brain: Age indeterminate focal hypodensities involving the left basal ganglia (3:14) and superior left cerebellum (7:46, new since prior exam. Additional scattered and confluent hypodense foci involving the periventricular and subcortical white matter are nonspecific however may reflect chronic microvascular ischemic changes. No mass lesion. No midline shift, ventriculomegaly or extra-axial fluid collection. Mild cerebral atrophy with ex vacuo dilatation. Vascular: No hyperdense vessel or unexpected calcification. Skull: Negative for fracture or focal lesion. Sinuses/Orbits: Normal orbits. Clear paranasal sinuses. No mastoid effusion. Other: None. CT CERVICAL SPINE FINDINGS Alignment: Straightening of lordosis.  No listhesis. Skull base and vertebrae: No acute fracture. No primary bone lesion or focal pathologic process. Soft tissues and spinal canal: No prevertebral fluid or swelling. No visible canal hematoma. Disc levels: Multilevel spondylosis most prominent at the C5-7 levels. Patent bony spinal canal. Multilevel mild-to-moderate bony neural foraminal narrowing secondary to uncovertebral and facet hypertrophy. Upper chest: Clear lung apices. Other: None. IMPRESSION:  Focal hypodensities involving the left basal ganglia and superior left cerebellum are age indeterminate. Consider MRI brain to exclude subacute insults. Mild cerebral atrophy and chronic microvascular ischemic changes. No acute fracture or traumatic listhesis.  Multilevel spondylosis. These results were called by telephone at the time of interpretation on 04/05/2020 at 10:51 am to provider [facility] , who verbally acknowledged these results. Electronically Signed   By: Stana Bunting M.D.   On: 04/05/2020 11:02   CT ANGIO NECK W OR WO CONTRAST  Result Date: 04/06/2020 CLINICAL DATA:  Stroke/TIA, assess extracranial arteries, right internal capsule stroke. EXAM: CT ANGIOGRAPHY HEAD AND NECK TECHNIQUE: Multidetector CT imaging of the head and neck was performed using the standard protocol during bolus administration of intravenous contrast. Multiplanar CT image reconstructions and MIPs were obtained to evaluate the vascular anatomy. Carotid stenosis measurements (when applicable) are obtained utilizing NASCET criteria, using the distal internal carotid diameter as the denominator. CONTRAST:  OMNIPAQUE IOHEXOL 350 MG/ML SOLN COMPARISON:  Brain MRI performed earlier the same day 04/05/2020, prior head CT examinations 04/05/2020 and earlier. FINDINGS: CT HEAD FINDINGS Brain: A punctate acute infarct within the right basal ganglia/posterior limb of right internal capsule was better appreciated on the brain MRI performed earlier the same day. Advanced ill-defined hypoattenuation within the cerebral white matter consistent with chronic small vessel ischemic disease. Redemonstrated chronic lacunar infarcts within the bilateral corona radiata/basal ganglia and right thalamus. Also redemonstrated is  a small chronic infarct within the left cerebellar hemisphere. There is no acute intracranial hemorrhage. No demarcated cortical infarct is identified. No extra-axial fluid collection. No evidence of intracranial  mass. No midline shift. Vascular: Reported below. Skull: Normal. Negative for fracture or focal lesion. Sinuses: Minimal ethmoid and maxillary sinus mucosal thickening. No significant mastoid effusion. Orbits: No acute finding. Review of the MIP images confirms the above findings CTA NECK FINDINGS Aortic arch: Common origin of the innominate and left common carotid arteries. Atherosclerotic plaque within the visualized aortic arch. No hemodynamically significant innominate or proximal subclavian artery stenosis. Right carotid system: CCA and ICA patent within the neck without significant stenosis (50% or greater). Minimal mixed plaque within the prox ICA. Left carotid system: CCA and ICA patent within the neck without significant stenosis (50% or greater). Mild mixed plaque within the carotid bifurcation and proximal ICA. This includes a small focus of ulcerated plaque along the posterior aspect of the carotid bulb (series 14, image 131) Vertebral arteries: Patent bilaterally without significant stenosis. The left vertebral artery is slightly dominant. Skeleton: No acute bony abnormality or aggressive osseous lesion. Cervical spondylosis with multilevel disc space narrowing, posterior disc osteophytes, uncovertebral and facet hypertrophy. Additionally, there are multilevel prominent ventral osteophytes most notably at the C5-C6 and C6-C7 levels. Other neck: No neck mass or cervical lymphadenopathy. Upper chest: No consolidation within the imaged lung apices. Review of the MIP images confirms the above findings CTA HEAD FINDINGS Anterior circulation: The intracranial internal carotid arteries are patent. Minimal calcified plaque within the intracranial left ICA without stenosis. The M1 middle cerebral arteries are patent without significant stenosis. Mild-to-moderate segmental narrowing of the proximal to mid M1 left MCA (series 15, image 50). No M2 proximal branch occlusion is identified. There are high-grade focal  stenoses within multiple distal M2 left MCA branch vessels (for instance as seen on series 17, image 28). The anterior cerebral arteries are patent. Moderate/severe segmental stenosis within the A2 right anterior cerebral artery (series 17, image 22). 2 mm inferiorly projecting vascular protrusion arising from the paraclinoid right ICA, which may reflect an infundibulum or small aneurysm (series 14, image 87). Posterior circulation: The intracranial vertebral arteries are patent. The basilar artery is patent. Fetal origin left posterior cerebral artery. The posterior cerebral arteries are patent bilaterally. There is a moderate focal stenosis within the P1 right PCA. Severe focal stenosis within the proximal P2 right PCA. Moderate/severe focal stenoses within the left posterior communicating artery and P2 left PCA (series 15, image 50). The right posterior communicating artery is hypoplastic or absent. Venous sinuses: Within limitations of contrast timing, no convincing thrombus. Anatomic variants: As described Review of the MIP images confirms the above findings IMPRESSION: CT head: 1. A punctate acute infarct within the right basal ganglia/posterior limb of right internal capsule was better appreciated on the brain MRI performed earlier the same day. 2. 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 3. Redemonstrated small chronic infarct within the left cerebellum. CTA neck: The common carotid, internal carotid and vertebral arteries are patent within the neck without hemodynamically significant stenosis. Mild calcified plaque within the carotid bifurcations/proximal ICAs bilaterally. This includes a focus of ulcerated plaque within the posterior aspect of the left carotid bulb. CTA head: 1. No intracranial large vessel occlusion. 2. Intracranial atherosclerotic disease with  multifocal stenoses, most notably as follows. 3. Mild-to-moderate segmental narrowing of the proximal to  mid M1 left MCA. 4. High-grade focal stenoses within multiple distal M2 left MCA branch vessels. 5. Moderate/severe segmental stenosis within the A2 right anterior cerebral artery. 6. Moderate/severe focal stenosis within the P1 right PCA 7. Severe focal stenosis within the P2 right PCA. 8. The left PCA is fetal in origin. Moderate/severe focal stenoses within the left posterior communicating artery and P2 left PCA. 9. 2 mm vascular protrusion arising from the paraclinoid right ICA which may reflect an infundibulum or small aneurysm. Electronically Signed   By: Jackey Loge DO   On: 04/06/2020 14:34   CT Cervical Spine Wo Contrast  Result Date: 04/05/2020 CLINICAL DATA:  Trauma EXAM: CT HEAD WITHOUT CONTRAST CT CERVICAL SPINE WITHOUT CONTRAST TECHNIQUE: Multidetector CT imaging of the head and cervical spine was performed following the standard protocol without intravenous contrast. Multiplanar CT image reconstructions of the cervical spine were also generated. COMPARISON:  01/18/2015 head CT. FINDINGS: CT HEAD FINDINGS Brain: Age indeterminate focal hypodensities involving the left basal ganglia (3:14) and superior left cerebellum (7:46, new since prior exam. Additional scattered and confluent hypodense foci involving the periventricular and subcortical white matter are nonspecific however may reflect chronic microvascular ischemic changes. No mass lesion. No midline shift, ventriculomegaly or extra-axial fluid collection. Mild cerebral atrophy with ex vacuo dilatation. Vascular: No hyperdense vessel or unexpected calcification. Skull: Negative for fracture or focal lesion. Sinuses/Orbits: Normal orbits. Clear paranasal sinuses. No mastoid effusion. Other: None. CT CERVICAL SPINE FINDINGS Alignment: Straightening of lordosis.  No listhesis. Skull base and vertebrae: No acute fracture. No primary bone lesion  or focal pathologic process. Soft tissues and spinal canal: No prevertebral fluid or swelling. No visible canal hematoma. Disc levels: Multilevel spondylosis most prominent at the C5-7 levels. Patent bony spinal canal. Multilevel mild-to-moderate bony neural foraminal narrowing secondary to uncovertebral and facet hypertrophy. Upper chest: Clear lung apices. Other: None. IMPRESSION: Focal hypodensities involving the left basal ganglia and superior left cerebellum are age indeterminate. Consider MRI brain to exclude subacute insults. Mild cerebral atrophy and chronic microvascular ischemic changes. No acute fracture or traumatic listhesis.  Multilevel spondylosis. These results were called by telephone at the time of interpretation on 04/05/2020 at 10:51 am to provider [facility] , who verbally acknowledged these results. Electronically Signed   By: Stana Bunting M.D.   On: 04/05/2020 11:02   MR Brain Wo Contrast (neuro protocol)  Result Date: 04/05/2020 CLINICAL DATA:  59 year old male status post trauma, neurologic deficit with left side weakness. Age indeterminate ischemia in the left basal ganglia and left cerebellum on plain head CT earlier today. EXAM: MRI HEAD WITHOUT CONTRAST TECHNIQUE: Multiplanar, multiecho pulse sequences of the brain and surrounding structures were obtained without intravenous contrast. COMPARISON:  Head CT 1029 hours today. FINDINGS: Brain: Questionable punctate focus of restricted diffusion at the dorsal right lentiform near the posterior limb of the right internal capsule, occurring in an area of chronic small vessel ischemia and encephalomalacia (series 5, image 80 and series 8, image 15). No other restricted diffusion. 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  Moderate patchy T2 hyperintensity in the right paracentral pons also most resembles chronic small-vessel ischemia. No cortical encephalomalacia or definite chronic blood products. No midline shift, mass effect, evidence of mass lesion, ventriculomegaly, extra-axial collection or acute intracranial hemorrhage. Cervicomedullary  junction and pituitary are within normal limits. Vascular: Major intracranial vascular flow voids are preserved. Skull and upper cervical spine: Negative for age visible cervical spine, bone marrow signal. Sinuses/Orbits: Negative orbits. Paranasal sinuses and mastoids are stable and well pneumatized. Other: There is a small volume of fluid trapped in the left petrous apex (series 10, image 12) with facilitated diffusion and no complicating features. Otherwise grossly normal visible internal auditory structures. Scalp and face appear negative. IMPRESSION: 1. Suspect punctate acute on chronic lacunar infarct at the dorsal right lentiform / posterior limb of the right internal capsule. No associated hemorrhage or mass effect. 2. Underlying advanced chronic small vessel disease, with chronic ischemia in the left basal ganglia and the left cerebellum SCA territory corresponding to head CT findings earlier today. Electronically Signed   By: Odessa Fleming M.D.   On: 04/05/2020 13:44   DG Knee Complete 4 Views Left  Result Date: 04/05/2020 CLINICAL DATA:  Left knee pain after fall. EXAM: LEFT KNEE - COMPLETE 4+ VIEW COMPARISON:  None. FINDINGS: No evidence of fracture, dislocation, or joint effusion. No evidence of arthropathy or other focal bone abnormality. Soft tissues are unremarkable. IMPRESSION: Negative. Electronically Signed   By: Lupita Raider M.D.   On: 04/05/2020 11:03   ECHOCARDIOGRAM COMPLETE  Result Date: 04/06/2020    ECHOCARDIOGRAM REPORT   Patient Name:   [name] Date of Exam: 04/06/2020 Medical Rec #:  [id number]      Height:       69.0 in Accession #:    [id number]      Weight:       175.0 lb Date of Birth:  15-Nov-1960       BSA:          1.952 m Patient Age:    59 years       BP:           125/70 mmHg Patient Gender: M              HR:           68 bpm. Exam Location:  Inpatient Procedure: 2D Echo, Cardiac Doppler, Color Doppler and Strain Analysis Indications:    Stroke  History:        Patient has prior history of Echocardiogram examinations, most                 recent 02/29/2016. Stroke, Signs/Symptoms:Chest Pain; Risk                 Factors:Diabetes and Dyslipidemia. Cocaine use.  Sonographer:    Sheralyn Boatman RDCS Referring Phys: [id number] RONDELL A SMITH  Sonographer Comments: Technically difficult study due to poor echo windows. Global longitudinal strain was attempted. IMPRESSIONS  1. Left ventricular ejection fraction, by estimation, is 55 to 60%. The left ventricle has normal function. The left ventricle has no regional wall motion abnormalities. There is moderate left ventricular hypertrophy. Left ventricular diastolic parameters are consistent with Grade II diastolic dysfunction (pseudonormalization). The average left ventricular global longitudinal strain is -18.1 %.  2. Right ventricular systolic function is normal. The right ventricular size is normal.  3. The mitral valve is grossly normal. Trivial mitral valve regurgitation.  4. The aortic valve is grossly normal. Aortic valve regurgitation is not visualized. No aortic stenosis is present. FINDINGS  Left Ventricle: Left ventricular ejection fraction, by estimation, is 55 to 60%. The left ventricle has normal function. The left ventricle has no regional wall motion abnormalities. The average  left ventricular global longitudinal strain is -18.1 %. The left ventricular internal cavity size was normal in size. There is moderate left ventricular hypertrophy. Left ventricular diastolic parameters are consistent with Grade II diastolic dysfunction (pseudonormalization). Right Ventricle: The right ventricular size is normal. No  increase in right ventricular wall thickness. Right ventricular systolic function is normal. Left Atrium: Left atrial size was normal in size. Right Atrium: Right atrial size was normal in size. Pericardium: There is no evidence of pericardial effusion. Mitral Valve: The mitral valve is grossly normal. Trivial mitral valve regurgitation. Tricuspid Valve: The tricuspid valve is grossly normal. Tricuspid valve regurgitation is trivial. Aortic Valve: The aortic valve is grossly normal. Aortic valve regurgitation is not visualized. No aortic stenosis is present. Pulmonic Valve: The pulmonic valve was normal in structure. Pulmonic valve regurgitation is not visualized. Aorta: The aortic root and ascending aorta are structurally normal, with no evidence of dilitation. IAS/Shunts: The atrial septum is grossly normal.  LEFT VENTRICLE PLAX 2D LVIDd:         3.60 cm     Diastology LVIDs:         2.50 cm     LV e' medial:    7.07 cm/s LV PW:         1.70 cm     LV E/e' medial:  9.5 LV IVS:        1.30 cm     LV e' lateral:   10.60 cm/s LVOT diam:     1.90 cm     LV E/e' lateral: 6.3 LV SV:         45 LV SV Index:   23          2D Longitudinal Strain LVOT Area:     2.84 cm    2D Strain GLS Avg:     -18.1 %  LV Volumes (MOD) LV vol d, MOD A2C: 47.7 ml LV vol d, MOD A4C: 71.2 ml LV vol s, MOD A2C: 23.2 ml LV vol s, MOD A4C: 29.2 ml LV SV MOD A2C:     24.5 ml LV SV MOD A4C:     71.2 ml LV SV MOD BP:      32.5 ml RIGHT VENTRICLE             IVC RV S prime:     10.90 cm/s  IVC diam: 1.80 cm TAPSE (M-mode): 1.9 cm LEFT ATRIUM             Index      RIGHT ATRIUM           Index LA diam:        4.00 cm 2.05 cm/m RA Area:     11.70 cm LA Vol (A2C):   13.0 ml 6.66 ml/m RA Volume:   25.20 ml  12.91 ml/m LA Vol (A4C):   18.2 ml 9.32 ml/m LA Biplane Vol: 16.8 ml 8.61 ml/m  AORTIC VALVE LVOT Vmax:   89.20 cm/s LVOT Vmean:  63.900 cm/s LVOT VTI:    0.159 m  AORTA Ao Root diam: 3.30 cm Ao Asc diam:  3.20 cm MITRAL VALVE MV Area (PHT):  2.73 cm    SHUNTS MV Decel Time: 278 msec    Systemic VTI:  0.16 m MV E velocity: 67.20 cm/s  Systemic Diam: 1.90 cm MV A velocity: 50.00 cm/s MV E/A ratio:  1.34 Kristeen Miss MD Electronically signed by Kristeen Miss MD Signature Date/Time: 04/06/2020/12:47:27 PM    Final  VAS US CAROTID  Result Date: 04/06/2020 Carotid Arterial Duplex Study Indications:       CVA. Risk Factors:      Prior CVA. Comparison Study:  No prior studies. Performing Technologist: Chanda Busing RVT  Examination Guidelines: A complete evaluation includes B-mode imaging, spectral Doppler, color Doppler, and power Doppler as needed of all accessible portions of each vessel. Bilateral testing is considered an integral part of a complete examination. Limited examinations for reoccurring indications may be performed as noted.  Right Carotid Findings: +----------+--------+--------+--------+-----------------------+--------+           PSV cm/sEDV cm/sStenosisPlaque Description     Comments +----------+--------+--------+--------+-----------------------+--------+ CCA Prox  85      17              smooth and heterogenous         +----------+--------+--------+--------+-----------------------+--------+ CCA Distal42      14              smooth and heterogenous         +----------+--------+--------+--------+-----------------------+--------+ ICA Prox  38      16              smooth and heterogenous         +----------+--------+--------+--------+-----------------------+--------+ ICA Distal58      21                                              +----------+--------+--------+--------+-----------------------+--------+ ECA       87      16                                              +----------+--------+--------+--------+-----------------------+--------+ +----------+--------+-------+--------+-------------------+           PSV cm/sEDV cmsDescribeArm Pressure (mmHG)  +----------+--------+-------+--------+-------------------+ [id number]                                        +----------+--------+-------+--------+-------------------+ +---------+--------+--+--------+--+---------+ VertebralPSV cm/s32EDV cm/s13Antegrade +---------+--------+--+--------+--+---------+  Left Carotid Findings: +----------+--------+--------+--------+-----------------------+--------+           PSV cm/sEDV cm/sStenosisPlaque Description     Comments +----------+--------+--------+--------+-----------------------+--------+ CCA Prox  75      15              smooth and heterogenous         +----------+--------+--------+--------+-----------------------+--------+ CCA Distal31      8               smooth and heterogenous         +----------+--------+--------+--------+-----------------------+--------+ ICA Prox  37      14              smooth and heterogenous         +----------+--------+--------+--------+-----------------------+--------+ ICA Distal84      36                                              +----------+--------+--------+--------+-----------------------+--------+ ECA       77      18                                              +----------+--------+--------+--------+-----------------------+--------+ +----------+--------+--------+--------+-------------------+  PSV cm/sEDV cm/sDescribeArm Pressure (mmHG) +----------+--------+--------+--------+-------------------+ [id number]                                          +----------+--------+--------+--------+-------------------+ +---------+--------+--+--------+--+---------+ VertebralPSV cm/s46EDV cm/s14Antegrade +---------+--------+--+--------+--+---------+   Summary: Right Carotid: Velocities in the right ICA are consistent with a 1-39% stenosis. Left Carotid: Velocities in the left ICA are consistent with a 1-39% stenosis. Vertebrals: Bilateral vertebral arteries demonstrate  antegrade flow. *See table(s) above for measurements and observations.  Electronically signed by Sherald Hess MD on 04/06/2020 at 4:37:52 PM.    Final         Scheduled Meds: . aspirin  300 mg Rectal Daily   Or  . aspirin  325 mg Oral Daily  . atorvastatin  80 mg Oral Daily  . clopidogrel  75 mg Oral Daily  . enoxaparin (LOVENOX) injection  40 mg Subcutaneous Q24H  . folic acid  1 mg Oral Daily  . insulin aspart  0-15 Units Subcutaneous TID WC  . insulin aspart  0-5 Units Subcutaneous QHS  . insulin detemir  40 Units Subcutaneous QHS  . multivitamin with minerals  1 tablet Oral Daily  . thiamine  100 mg Oral Daily   Or  . thiamine  100 mg Intravenous Daily   Continuous Infusions:        Glade Lloyd, MD [facility] 04/07/2020, 10:34 AM

## 2020-04-07 NOTE — Evaluation (Signed)
Physical Therapy Evaluation Patient Details Name: [name] MRN: [id number] DOB: September 04, 1960 Today's Date: 04/07/2020   History of Present Illness  59 year old male with history of diabetes mellitus type 2, hepatitis C, bipolar disorder presented with suicidal ideations and falls.  He reportedly attempted to place a gun to his head and pulled the trigger, but it was jammed.  Patient did admit to recently using cocaine and alcohol 6 days ago.  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  Clinical Impression  Pt admitted with above diagnosis.  Pt currently with functional limitations due to the deficits listed below (see PT Problem List). Pt will benefit from skilled PT to increase their independence and safety with mobility to allow discharge to the venue listed below.  Pt reports multiple falls at home due to balance issues.  Pt min assist currently for mobility.  Pt requesting rehab upon d/c although ultimately states he feels he needs to live in a "group home" or somewhere that can provide more assist for him.     Follow Up Recommendations SNF;Supervision/Assistance - 24 hour    Equipment Recommendations  Rolling walker with 5" wheels    Recommendations for Other Services       Precautions / Restrictions Precautions Precautions: Fall Precaution Comments: 1:1 Sitter Restrictions Weight Bearing Restrictions: No      Mobility  Bed Mobility Overal bed mobility: Modified Independent             General bed mobility comments: NT  Transfers Overall transfer level: Needs assistance Equipment used: Straight cane Transfers: Sit to/from Stand Sit to Stand: Min assist Stand pivot transfers: Min assist       General transfer comment: cues for hand placement  Ambulation/Gait Ambulation/Gait assistance: Min assist Gait Distance (Feet): 60 Feet Assistive  device: Straight cane Gait Pattern/deviations: Step-through pattern;Decreased stance time - left;Decreased step length - right Gait velocity: decr   General Gait Details: occasional steady assist, pt declines using RW, utilized cane from home  Stairs            Wheelchair Mobility    Modified Rankin (Stroke Patients Only) Modified Rankin (Stroke Patients Only) Pre-Morbid Rankin Score: Moderate disability Modified Rankin: Moderately severe disability     Balance Overall balance assessment: Needs assistance;History of Falls Sitting-balance support: Single extremity supported Sitting balance-Leahy Scale: Fair Sitting balance - Comments: Pt slumps back into chair after sitting edge of chair for ~30 seconds. Postural control: Posterior lean Standing balance support: No upper extremity supported Standing balance-Leahy Scale: Fair                               Pertinent Vitals/Pain Pain Assessment: No/denies pain Pain Score: 10-Worst pain ever Pain Location: LT LE from knee to foot and and LT hand Pain Descriptors / Indicators: Numbness;Sharp Pain Intervention(s): Repositioned    Home Living Family/patient expects to be discharged to:: Private residence Living Arrangements: Children (Daughter who works days. Grandchildren ages 76 and 13 years.) Available Help at Discharge: Family;Available PRN/intermittently Type of Home: Apartment Home Access: Stairs to enter Entrance Stairs-Rails: None Entrance Stairs-Number of Steps: 3-4 Home Layout: One level Home Equipment: Walker - 2 wheels;Walker - 4 wheels;Cane - single point      Prior Function Level of Independence: Needs assistance   Gait / Transfers Assistance Needed: Mod I with cane limited household.  States that he uses his Rollator outside of home  ADL's / Homemaking Assistance Needed: Daughter provides all housekeeping and transportation of pt takes public transit.  Pt reports that family is in a 2 bedroom  apartment and pt must sleep on the couch. Pt reports this is not a sustainable living situation and pt is asking for help finding a group home after SNF.        Hand Dominance   Dominant Hand: Right    Extremity/Trunk Assessment   Upper Extremity Assessment Upper Extremity Assessment: Generalized weakness;LUE deficits/detail LUE Deficits / Details: Able to raise LUE symmetrically with RUE. MMT of 3+/5 to shoulder with pt reporting increased pain. elbow and wrist 3+/5, grip: 4-/5 with positive composite grip.  Slight lack of supination which imporved with AROM repititions. LUE Sensation: decreased light touch (Intact proprioception) LUE Coordination: decreased fine motor;decreased gross motor    Lower Extremity Assessment Lower Extremity Assessment: LLE deficits/detail LLE Deficits / Details: grossly 2+/5 throughout with MMT       Communication   Communication: No difficulties  Cognition Arousal/Alertness: Awake/alert Behavior During Therapy: WFL for tasks assessed/performed Overall Cognitive Status: No family/caregiver present to determine baseline cognitive functioning                                 General Comments: Pt under 1:1 supervision.  Pt with delayed processing but responses appropriate.      General Comments      Exercises     Assessment/Plan    PT Assessment Patient needs continued PT services  PT Problem List Decreased strength;Decreased activity tolerance;Decreased mobility;Decreased balance;Decreased knowledge of use of DME       PT Treatment Interventions DME instruction;Therapeutic exercise;Stair training;Neuromuscular re-education;Functional mobility training;Therapeutic activities;Patient/family education;Balance training;Gait training    PT Goals (Current goals can be found in the Care Plan section)  Acute Rehab PT Goals PT Goal Formulation: With patient Time For Goal Achievement: 04/21/20 Potential to Achieve Goals: Good     Frequency Min 3X/week   Barriers to discharge        Co-evaluation               AM-PAC PT "6 Clicks" Mobility  Outcome Measure Help needed turning from your back to your side while in a flat bed without using bedrails?: A Little Help needed moving from lying on your back to sitting on the side of a flat bed without using bedrails?: A Little Help needed moving to and from a bed to a chair (including a wheelchair)?: A Little Help needed standing up from a chair using your arms (e.g., wheelchair or bedside chair)?: A Little Help needed to walk in hospital room?: A Little Help needed climbing 3-5 steps with a railing? : A Little 6 Click Score: 18    End of Session Equipment Utilized During Treatment: Gait belt Activity Tolerance: Patient tolerated treatment well Patient left: with call bell/phone within reach;in chair;with nursing/sitter in room Nurse Communication: Mobility status PT Visit Diagnosis: Repeated falls (R29.6);Other abnormalities of gait and mobility (R26.89)    Time: 1000-1014 PT Time Calculation (min) (ACUTE ONLY): 14 min   Charges:   PT Evaluation $PT Eval Low Complexity: 1 Low        Kati PT, DPT Acute Rehabilitation Services Pager: [phone] Office: [phone]  Sarajane Jews 04/07/2020, 12:26 PM

## 2020-04-07 NOTE — TOC Progression Note (Addendum)
Transition of Care [facility]) - Progression Note    Patient Details  Name: [name] MRN: [id number] Date of Birth: 1960/10/11  [facility] CM/SW Contact  Geni Bers, RN Phone Number: 04/07/2020, 3:56 PM  Clinical Narrative:    [facility] was called to request a bed.     Expected Discharge Plan: Skilled Nursing Facility Barriers to Discharge: Continued Medical Work up  Expected Discharge Plan and Services Expected Discharge Plan: Skilled Nursing Facility In-house Referral: Clinical Social Work Discharge Planning Services: CM Consult Post Acute Care Choice: Skilled Nursing Facility Living arrangements for the past 2 months: Single Family Home                                       Social Determinants of Health (SDOH) Interventions    Readmission Risk Interventions No flowsheet data found.

## 2020-04-07 NOTE — Evaluation (Signed)
Occupational Therapy Evaluation Patient Details Name: [name] MRN: [id number] DOB: [birth year] Today's Date: 04/07/2020    History of Present Illness 59 year old male with history of diabetes mellitus type 2, hepatitis C, bipolar disorder presented with suicidal ideations and falls.  He reportedly attempted to place a gun to his head and pulled the trigger, but it was jammed.  Patient did admit to recently using cocaine and alcohol 6 days ago.  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   Clinical Impression   An occupational therapy evaluation was completed on this 59 year old, Right handed,  male with pertinent past medical history of schizophrenia, bipolar, HTN, Hep C, DM, cocaine use as well as PMH in chart. Patient is currently requiring assistance with ADLs including minimal assist with LB and UB dressing and bathing, toileting and full setup for seated grooming,  all of which is below patient's typical baseline of being Modified independent however pt does endorse 6 month of progressively worsening weakness and increased difficulty completing his basic self care including being able to get off the couch where he sleeps and make to the bathroom on times.  During this evaluation, patient was limited by Lt sided pain and weakness, decreased sensation to LT hand, decreased mood and poor social support with reported loss of his sister and his mother within the last few months.  Current limitations have the potential to impact patient's safety and independence during functional mobility, as well as performance for ADLs. ?[employer] AM-PAC "6-clicks" Daily Activity Inpatient Short Form score of 18/24 indicates moderate ADL impairment this session. Patient lives with his daughter and two teenage grandchildren, who are able to provide only PRN supervision and assistance.   Patient demonstrates good rehab potential, and should benefit from continued skilled occupational therapy services while in acute care to maximize safety, independence and quality of life at home.  Continued occupational therapy services in a SNF setting prior to return home is recommended.  ?   Follow Up Recommendations  SNF    Equipment Recommendations  3 in 1 bedside commode    Recommendations for Other Services       Precautions / Restrictions Precautions Precautions: Fall Precaution Comments: 1:1 Sitter Restrictions Weight Bearing Restrictions: No      Mobility Bed Mobility Overal bed mobility: Modified Independent             General bed mobility comments: NT  Transfers Overall transfer level: Needs assistance Equipment used: Straight cane Transfers: Sit to/from Stand Sit to Stand: Min assist Stand pivot transfers: Min assist       General transfer comment: cues for hand placement    Balance Overall balance assessment: Needs assistance;History of Falls Sitting-balance support: Single extremity supported Sitting balance-Leahy Scale: Fair Sitting balance - Comments: Pt slumps back into chair after sitting edge of chair for ~30 seconds. Postural control: Posterior lean Standing balance support: No upper extremity supported Standing balance-Leahy Scale: Fair                             ADL either performed or assessed with clinical judgement   ADL Overall ADL's : Needs assistance/impaired     Grooming: Wash/dry face;Oral care;Set up;Sitting   Upper Body Bathing: Sitting;Set up;Supervision/ safety Upper Body Bathing Details (indicate cue type and reason): Based on general assessment. Lower Body Bathing: Minimal  assistance;Sitting/lateral leans;Sit to/from stand;Cueing for safety   Upper Body Dressing : Min guard;Set up;Sitting   Lower Body Dressing: Sitting/lateral leans;Sit to/from stand;Minimal assistance   Toilet Transfer:  Ambulation;Regular Toilet;Moderate assistance;Maximal assistance Toilet Transfer Details (indicate cue type and reason): Pt used wall mounted grab bar to descend to toilet with moderate assist due to poor eccentric control.  Pt cued to not use grab bar when standing to simulate home environment and pt required Maximum assist and max effort to rise from toilet. Toileting- Clothing Manipulation and Hygiene: Minimal assistance;Sitting/lateral lean;Sit to/from stand       Functional mobility during ADLs: Minimal assistance;Cane;Cueing for sequencing       Vision   Vision Assessment?: No apparent visual deficits     Perception     Praxis      Pertinent Vitals/Pain Pain Assessment: No/denies pain Pain Score: 10-Worst pain ever Pain Location: LT LE from knee to foot and and LT hand Pain Descriptors / Indicators: Numbness;Sharp Pain Intervention(s): Repositioned     Hand Dominance Right   Extremity/Trunk Assessment Upper Extremity Assessment Upper Extremity Assessment: Generalized weakness;LUE deficits/detail LUE Deficits / Details: Able to raise LUE symmetrically with RUE. MMT of 3+/5 to shoulder with pt reporting increased pain. elbow and wrist 3+/5, grip: 4-/5 with positive composite grip.  Slight lack of supination which imporved with AROM repititions. LUE Sensation: decreased light touch (Intact proprioception) LUE Coordination: decreased fine motor;decreased gross motor   Lower Extremity Assessment Lower Extremity Assessment: LLE deficits/detail LLE Deficits / Details: grossly 2+/5 throughout with MMT       Communication Communication Communication: No difficulties   Cognition Arousal/Alertness: Awake/alert Behavior During Therapy: WFL for tasks assessed/performed Overall Cognitive Status: No family/caregiver present to determine baseline cognitive functioning                                 General Comments: Pt under 1:1 supervision.  Pt with delayed  processing but responses appropriate.   General Comments       Exercises Other Exercises Other Exercises: Bil shoulder shrugs x 5, shoulder rolls x5.  Pt instructed to integrate LUE into as many activities as possible.   Shoulder Instructions      Home Living Family/patient expects to be discharged to:: Private residence Living Arrangements: Children (Daughter who works days. Grandchildren ages 71 and 13 years.) Available Help at Discharge: Family;Available PRN/intermittently Type of Home: Apartment Home Access: Stairs to enter Entrance Stairs-Number of Steps: 3-4 Entrance Stairs-Rails: None Home Layout: One level     Bathroom Shower/Tub: Chief Strategy Officer: Standard Bathroom Accessibility: No   Home Equipment: Environmental consultant - 2 wheels;Walker - 4 wheels;Cane - single point          Prior Functioning/Environment Level of Independence: Needs assistance  Gait / Transfers Assistance Needed: Mod I with cane limited household. States that he uses his Rollator outside of home ADL's / Homemaking Assistance Needed: Daughter provides all housekeeping and transportation of pt takes public transit.  Pt reports that family is in a 2 bedroom apartment and pt must sleep on the couch. Pt reports this is not a sustainable living situation and pt is asking for help finding a group home after SNF. Communication / Swallowing Assistance Needed: No deficits noted.          OT Problem List: Decreased strength;Decreased coordination;Pain;Impaired sensation;Impaired UE functional use;Decreased knowledge of precautions;Decreased knowledge of use of DME or AE;Impaired balance (  sitting and/or standing);Decreased activity tolerance;Decreased safety awareness      OT Treatment/Interventions: Self-care/ADL training;Therapeutic exercise;Therapeutic activities;Neuromuscular education;Cognitive remediation/compensation;Energy conservation;DME and/or AE instruction;Patient/family education;Balance  training;Manual therapy    OT Goals(Current goals can be found in the care plan section) Acute Rehab OT Goals Patient Stated Goal: "Get back to everything." OT Goal Formulation: With patient Time For Goal Achievement: 04/21/20 Potential to Achieve Goals: Good ADL Goals Pt Will Perform Grooming: standing;with supervision Pt Will Perform Upper Body Dressing: with set-up;with supervision;sitting Pt Will Perform Lower Body Dressing: with adaptive equipment;with supervision;with set-up;sitting/lateral leans;sit to/from stand Pt Will Transfer to Toilet: bedside commode;ambulating;with supervision Pt Will Perform Toileting - Clothing Manipulation and hygiene: sitting/lateral leans;sit to/from stand;with supervision Pt/caregiver will Perform Home Exercise Program: Both right and left upper extremity;With written HEP provided;With Supervision  OT Frequency: Min 2X/week   Barriers to D/C: Inaccessible home environment;Decreased caregiver support          Co-evaluation              AM-PAC OT "6 Clicks" Daily Activity     Outcome Measure Help from another person eating meals?: A Little Help from another person taking care of personal grooming?: A Little Help from another person toileting, which includes using toliet, bedpan, or urinal?: A Little Help from another person bathing (including washing, rinsing, drying)?: A Little Help from another person to put on and taking off regular upper body clothing?: A Little Help from another person to put on and taking off regular lower body clothing?: A Little 6 Click Score: 18   End of Session Equipment Utilized During Treatment: Gait belt;Other (comment) (Pt's cane) Nurse Communication: Patient requests pain meds  Activity Tolerance: Patient tolerated treatment well Patient left: in chair;with nursing/sitter in room;with call bell/phone within reach  OT Visit Diagnosis: Unsteadiness on feet (R26.81);Hemiplegia and hemiparesis;History of  falling (Z91.81);Muscle weakness (generalized) (M62.81);Repeated falls (R29.6) Hemiplegia - Right/Left: Right Hemiplegia - dominant/non-dominant: Non-Dominant Hemiplegia - caused by: Cerebral infarction                Time: 1007-1036 OT Time Calculation (min): 29 min Charges:  OT General Charges $OT Visit: 1 Visit OT Evaluation $OT Eval Low Complexity: 1 Low OT Treatments $Therapeutic Exercise: 8-22 mins  Victorino Dike, OT Acute Rehab Services Office: [phone] 04/07/2020   Theodoro Clock 04/07/2020, 12:36 PM

## 2020-04-07 NOTE — Progress Notes (Signed)
Chart review followup  Remaining work up from consult completed. Reviewed.   Echo IMPRESSIONS  1. Left ventricular ejection fraction, by estimation, is 55 to 60%. The  left ventricle has normal function. The left ventricle has no regional  wall motion abnormalities. There is moderate left ventricular hypertrophy.  Left ventricular diastolic  parameters are consistent with Grade II diastolic dysfunction  (pseudonormalization). The average left ventricular global longitudinal  strain is -18.1 %.  2. Right ventricular systolic function is normal. The right ventricular  size is normal.  3. The mitral valve is grossly normal. Trivial mitral valve  regurgitation.  4. The aortic valve is grossly normal. Aortic valve regurgitation is not  visualized. No aortic stenosis is present.   LA size normal.  No afib noted on chart  CTA head/neck CTA neck:  The common carotid, internal carotid and vertebral arteries are patent within the neck without hemodynamically significant stenosis. Mild calcified plaque within the carotid bifurcations/proximal ICAs bilaterally. This includes a focus of ulcerated plaque within the posterior aspect of the left carotid bulb.  CTA head:  1. No intracranial large vessel occlusion. 2. Intracranial atherosclerotic disease with multifocal stenoses, most notably as follows. 3. Mild-to-moderate segmental narrowing of the proximal to mid M1 left MCA. 4. High-grade focal stenoses within multiple distal M2 left MCA branch vessels. 5. Moderate/severe segmental stenosis within the A2 right anterior cerebral artery. 6. Moderate/severe focal stenosis within the P1 right PCA 7. Severe focal stenosis within the P2 right PCA. 8. The left PCA is fetal in origin. Moderate/severe focal stenoses within the left posterior communicating artery and P2 left PCA. 9. 2 mm vascular protrusion arising from the paraclinoid right ICA which may reflect an infundibulum or  small aneurysm.  Impression 1. Subacute right posterior limb of internal capsule stroke. 2. Vessel imaging consistent with multifocal moderate to high-grade focal stenosis in multiple intracerebral vessels-likely atherosclerotic but could reflect sequelae of cocaine use as well. 3. Polysubstance abuse-cocaine positive on UDS 4.  Two mm vascular protrusion arising from prior carotid right ICA-infundibulum versus small aneurysm.  Repeat imaging in 6 months to a year to reassess to evaluate for referral to endovascular.  Recs: -Dual antiplatelets given intracranial stenosis-aspirin 325 and Plavix 75 for 3 months followed by Plavix 75 only. -High-dose statin-atorvastatin 80. -Abstain from cocaine and illicit drug use. -F/u therapy recs for dispo. -Outpatient stroke clinic follow-up in 4 to 6 weeks -Follow up incidental 47mm vascular protrusion of RICA/CCA bifurcation by imaging in a year with CTA  Inpatient neurology will sign off and will be available as needed.  Relayed plan to Dr. Hanley Ben, primary hospitalist.  -- Milon Dikes, MD [facility] Pager: [phone] If 7pm to 7am, please call on call as listed on AMION.

## 2020-04-08 DIAGNOSIS — F1494 Cocaine use, unspecified with cocaine-induced mood disorder: Secondary | ICD-10-CM

## 2020-04-08 LAB — GLUCOSE, CAPILLARY
Glucose-Capillary: 153 mg/dL — ABNORMAL HIGH (ref 70–99)
Glucose-Capillary: 215 mg/dL — ABNORMAL HIGH (ref 70–99)
Glucose-Capillary: 330 mg/dL — ABNORMAL HIGH (ref 70–99)
Glucose-Capillary: 237 mg/dL — ABNORMAL HIGH (ref 70–99)

## 2020-04-08 MED ORDER — THIAMINE HCL 100 MG PO TABS
100.0000 mg | ORAL_TABLET | Freq: Every day | ORAL | 0 refills | Status: DC
Start: 2020-04-09 — End: 2020-04-18

## 2020-04-08 MED ORDER — TRAZODONE HCL 100 MG PO TABS
100.0000 mg | ORAL_TABLET | Freq: Every day | ORAL | Status: DC
Start: 2020-04-08 — End: 2020-04-18

## 2020-04-08 MED ORDER — ARIPIPRAZOLE 5 MG PO TABS
5.0000 mg | ORAL_TABLET | Freq: Every day | ORAL | Status: DC
Start: 2020-04-09 — End: 2020-04-14

## 2020-04-08 MED ORDER — ATORVASTATIN CALCIUM 80 MG PO TABS
80.0000 mg | ORAL_TABLET | Freq: Every day | ORAL | 0 refills | Status: DC
Start: 2020-04-09 — End: 2020-04-18

## 2020-04-08 MED ORDER — ADULT MULTIVITAMIN W/MINERALS CH: 1.0000 | ORAL_TABLET | Freq: Every day | ORAL | Status: DC

## 2020-04-08 MED ORDER — CLOPIDOGREL BISULFATE 75 MG PO TABS
75.0000 mg | ORAL_TABLET | Freq: Every day | ORAL | 0 refills | Status: DC
Start: 2020-04-09 — End: 2020-04-18

## 2020-04-08 MED ORDER — ASPIRIN 325 MG PO TABS
325.0000 mg | ORAL_TABLET | Freq: Every day | ORAL | 0 refills | Status: DC
Start: 2020-04-09 — End: 2020-04-18

## 2020-04-08 MED ORDER — FOLIC ACID 1 MG PO TABS: 1.0000 mg | ORAL_TABLET | Freq: Every day | ORAL | 0 refills | Status: DC

## 2020-04-08 MED ORDER — INSULIN DETEMIR 100 UNIT/ML ~~LOC~~ SOLN
40.0000 [IU] | Freq: Every day | SUBCUTANEOUS | Status: DC
Start: 2020-04-08 — End: 2020-04-10

## 2020-04-08 NOTE — Progress Notes (Signed)
Verbal Order to discontinue NIH scale.

## 2020-04-08 NOTE — TOC Progression Note (Signed)
Transition of Care [facility]) - Progression Note    Patient Details  Name: [name] MRN: [id number] Date of Birth: 02/17/1961  [facility]) CM/SW Contact  Gustavus Bryant, [place] Phone Number: 04/08/2020, 8:42 AM  Clinical Narrative:   [name] completed call to [facility] and was informed that they are at full capacity and are unable to accept patient at this time. [name] was encouraged to check back tomorrow morning. [name] completed call to [facility] and placed referral/inquiry successfully on 04/08/20. Team will review referral today and agreed to follow back up with [name]. [name] updated inpatient TOC RNCM.    Expected Discharge Plan: Skilled Nursing Facility Barriers to Discharge: Continued Medical Work up  Expected Discharge Plan and Services Expected Discharge Plan: Skilled Nursing Facility In-house Referral: Clinical Social Work Discharge Planning Services: CM Consult Post Acute Care Choice: Skilled Nursing Facility Living arrangements for the past 2 months: Single Family Home                   Readmission Risk Interventions No flowsheet data found.  [name], [name], [name], [name] [place]  [email or web address]

## 2020-04-08 NOTE — TOC Progression Note (Signed)
Transition of Care [facility]) - Progression Note    Patient Details  Name: [name] MRN: [id number] Date of Birth: [birth year]  Transition of Care [facility]) CM/SW Contact  Gustavus Bryant, [place] Phone Number: 04/08/2020, 12:24 PM  Clinical Narrative:   LCSW received return call back from [facility] and was informed that their team has decided to decline patient's referral today based on patient's need for 1:1 assist, PT note and being a fall risk. LCSW will update attending physician. Social work will continue to follow and seek behavioral health placement elsewhere.   Expected Discharge Plan: Skilled Nursing Facility Barriers to Discharge: Continued Medical Work up  Expected Discharge Plan and Services Expected Discharge Plan: Skilled Nursing Facility In-house Referral: Clinical Social Work Discharge Planning Services: CM Consult Post Acute Care Choice: Skilled Nursing Facility Living arrangements for the past 2 months: Single Family Home Expected Discharge Date: 04/08/20                Readmission Risk Interventions No flowsheet data found.   Dickie La, BSW, MSW, LCSW [email or web address]

## 2020-04-08 NOTE — Progress Notes (Signed)
Patient is resting comfortably. Pt is calm quiet. Sitter is at bedside. Pt reports feeling of sadness and confirmed suicidal ideation. Will continue to monitor.

## 2020-04-08 NOTE — Progress Notes (Signed)
Patient remains quiet,calm and cooperative at this time. Pt just finished lunch and has no complaints in this moment. Pt stated he will rest.

## 2020-04-08 NOTE — Discharge Summary (Signed)
Physician Discharge Summary  Nadia Torr ZOX:[id number] DOB: 1961-06-12 DOA: 04/05/2020  PCP: System, Provider Not In  Admit date: 04/05/2020 Discharge date: 04/08/2020  Admitted From: Home Disposition: Inpatient psychiatric hospital  Recommendations for Outpatient Follow-up:  1. Follow up with provider at inpatient psychiatric hospital at earliest convenience over discharge 2. Outpatient follow-up with neurology 3. Follow up in ED if symptoms worsen or new appear   Home Health: No Equipment/Devices: None  Discharge Condition: Stable CODE STATUS: Full Diet recommendation: Heart healthy/carb modified  Brief/Interim Summary: 59 year old male with history of diabetes mellitus type 2, hepatitis C, bipolar disorder presented with suicidal ideations and falls.  He reportedly attempted to place a gun to his head and pulled the trigger, but it was jammed.  Patient did admit to recently using cocaine and alcohol 6 days ago.  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  Neurology/psychiatry were consulted.  Neurology recommended aspirin and Plavix for 3 months along with Lipitor with outpatient follow-up with neurology.  Psychiatry recommended inpatient psychiatric hospitalization.  Patient will be discharged to inpatient psychiatric facility once bed is available.  He is currently medically stable for discharge.  Discharge Diagnoses:   Acute on chronic lacunar infarct in the dorsal right lentiform/posterior limb of the right Internal capsule: Ischemic stroke versus cocaine induced -Presented with recurrent falls and ongoing left-sided weakness for months -Neurology evaluation and follow-up appreciated: Recommend aspirin and Plavix for 3 months followed by Plavix 75 mg daily along with Lipitor 80 mg daily -PT recommended SNF.  Tolerating diet as per SLP recommendations. -LDL 171.   Hemoglobin A1c 14.2 -Carotid ultrasound showed bilateral 1 to 39% ICA stenosis.  2D echo showed EF of 55 to 60% with grade 2 diastolic dysfunction -Neurology has signed off.  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   Outpatient follow-up with repeat CTA in a year as per neurology  Bipolar disorder with suicidal ideation/attempt -Psychiatry recommended inpatient psychiatric hospitalization.  Patient will be discharged to inpatient psychiatric facility once bed is available.  He is currently medically stable for discharge.  Currently on Abilify and trazodone as per psychiatry recommendations.  Diabetes mellitus type 2 uncontrolled with hyperglycemia -A1c 14.2.  Continue Levemir along with Humalog.  Carb modified diet.  Resume Metformin.  Hold glipizide.  Outpatient follow-up with PCP  Recurrent falls -PT recommends SNF.   He will probably need to go to SNF from inpatient psychiatric hospital.  no acute injuries except for small abrasion along the knee  Hyperlipidemia -Statin plan as above  Polysubstance abuse -Longstanding history of cocaine/alcohol abuse.  Urine drug screen was positive for cocaine.  Social worker consult.    Discharge Instructions  Discharge Instructions    Ambulatory referral to Neurology   Complete by: As directed    An appointment is requested in approximately: 2-3 weeks for follow-up of stroke   Diet - low sodium heart healthy   Complete by: As directed    Diet Carb Modified   Complete by: As directed    Increase activity slowly   Complete by: As directed      Allergies as of 04/08/2020      Reactions   Wellbutrin [bupropion] Hives,  Swelling      Medication List     STOP taking these medications   FLUoxetine 40 MG capsule Commonly known as: PROZAC   glipiZIDE 10 MG tablet Commonly known as: GLUCOTROL   hydrOXYzine 25 MG tablet Commonly known as: ATARAX/VISTARIL   QUEtiapine 200 MG tablet Commonly known as: SEROQUEL   zolpidem 5 MG tablet Commonly known as: AMBIEN     TAKE these medications   ARIPiprazole 5 MG tablet Commonly known as: ABILIFY Take 1 tablet (5 mg total) by mouth daily. Start taking on: April 09, 2020 What changed:   medication strength  how much to take   aspirin 325 MG tablet Take 1 tablet (325 mg total) by mouth daily. Start taking on: April 09, 2020   atorvastatin 80 MG tablet Commonly known as: LIPITOR Take 1 tablet (80 mg total) by mouth daily. Start taking on: April 09, 2020   clopidogrel 75 MG tablet Commonly known as: PLAVIX Take 1 tablet (75 mg total) by mouth daily. Start taking on: April 09, 2020   folic acid 1 MG tablet Commonly known as: FOLVITE Take 1 tablet (1 mg total) by mouth daily. Start taking on: April 09, 2020   HumaLOG KwikPen 100 UNIT/ML KwikPen Generic drug: insulin lispro Inject 0-0.1 mLs (0-10 Units total) into the skin 3 (three) times daily with meals.   insulin detemir 100 UNIT/ML injection Commonly known as: LEVEMIR Inject 0.4 mLs (40 Units total) into the skin at bedtime. What changed:   how much to take  additional instructions   metFORMIN 1000 MG tablet Commonly known as: GLUCOPHAGE Take 1 tablet (1,000 mg total) by mouth 2 (two) times daily with a meal.   multivitamin with minerals Tabs tablet Take 1 tablet by mouth daily. Start taking on: April 09, 2020   thiamine 100 MG tablet Take 1 tablet (100 mg total) by mouth daily. Start taking on: April 09, 2020   traZODone 100 MG tablet Commonly known as: DESYREL Take 1 tablet (100 mg total) by mouth at bedtime.       Allergies  Allergen Reactions  . Wellbutrin [Bupropion]  Hives and Swelling    Consultations:  Neurology/psychiatry   Procedures/Studies: CT ANGIO HEAD W OR WO CONTRAST  Result Date: 04/06/2020 CLINICAL DATA:  Stroke/TIA, assess extracranial arteries, right internal capsule stroke. EXAM: CT ANGIOGRAPHY HEAD AND NECK TECHNIQUE: Multidetector CT imaging of the head and neck was performed using the standard protocol during bolus administration of intravenous contrast. Multiplanar CT image reconstructions and MIPs were obtained to evaluate the vascular anatomy. Carotid stenosis measurements (when applicable) are obtained utilizing NASCET criteria, using the distal internal carotid diameter as the denominator. CONTRAST:  OMNIPAQUE IOHEXOL 350 MG/ML SOLN COMPARISON:  Brain MRI performed earlier the same day 04/05/2020, prior head CT examinations 04/05/2020 and earlier. FINDINGS: CT HEAD FINDINGS Brain: A punctate acute infarct within the right basal ganglia/posterior limb of right internal capsule was better appreciated on the brain MRI performed earlier the same day. Advanced ill-defined hypoattenuation within the cerebral white matter consistent with chronic small vessel ischemic disease. Redemonstrated chronic lacunar infarcts within the bilateral corona radiata/basal ganglia and right thalamus. Also redemonstrated is a small chronic infarct within the left cerebellar hemisphere. There is no acute intracranial hemorrhage. No demarcated cortical infarct is identified. No extra-axial fluid collection. No evidence of intracranial mass. No midline shift. Vascular: Reported below. Skull: Normal. Negative for fracture or focal lesion. Sinuses: Minimal ethmoid and maxillary sinus mucosal thickening. No significant mastoid  effusion. Orbits: No acute finding. Review of the MIP images confirms the above findings CTA NECK FINDINGS Aortic arch: Common origin of the innominate and left common carotid arteries. Atherosclerotic plaque within the visualized aortic arch. No  hemodynamically significant innominate or proximal subclavian artery stenosis. Right carotid system: CCA and ICA patent within the neck without significant stenosis (50% or greater). Minimal mixed plaque within the prox ICA. Left carotid system: CCA and ICA patent within the neck without significant stenosis (50% or greater). Mild mixed plaque within the carotid bifurcation and proximal ICA. This includes a small focus of ulcerated plaque along the posterior aspect of the carotid bulb (series 14, image 131) Vertebral arteries: Patent bilaterally without significant stenosis. The left vertebral artery is slightly dominant. Skeleton: No acute bony abnormality or aggressive osseous lesion. Cervical spondylosis with multilevel disc space narrowing, posterior disc osteophytes, uncovertebral and facet hypertrophy. Additionally, there are multilevel prominent ventral osteophytes most notably at the C5-C6 and C6-C7 levels. Other neck: No neck mass or cervical lymphadenopathy. Upper chest: No consolidation within the imaged lung apices. Review of the MIP images confirms the above findings CTA HEAD FINDINGS Anterior circulation: The intracranial internal carotid arteries are patent. Minimal calcified plaque within the intracranial left ICA without stenosis. The M1 middle cerebral arteries are patent without significant stenosis. Mild-to-moderate segmental narrowing of the proximal to mid M1 left MCA (series 15, image 50). No M2 proximal branch occlusion is identified. There are high-grade focal stenoses within multiple distal M2 left MCA branch vessels (for instance as seen on series 17, image 28). The anterior cerebral arteries are patent. Moderate/severe segmental stenosis within the A2 right anterior cerebral artery (series 17, image 22). 2 mm inferiorly projecting vascular protrusion arising from the paraclinoid right ICA, which may reflect an infundibulum or small aneurysm (series 14, image 87). Posterior circulation:  The intracranial vertebral arteries are patent. The basilar artery is patent. Fetal origin left posterior cerebral artery. The posterior cerebral arteries are patent bilaterally. There is a moderate focal stenosis within the P1 right PCA. Severe focal stenosis within the proximal P2 right PCA. Moderate/severe focal stenoses within the left posterior communicating artery and P2 left PCA (series 15, image 50). The right posterior communicating artery is hypoplastic or absent. Venous sinuses: Within limitations of contrast timing, no convincing thrombus. Anatomic variants: As described Review of the MIP images confirms the above findings IMPRESSION: CT head: 1. A punctate acute infarct within the right basal ganglia/posterior limb of right internal capsule was better appreciated on the brain MRI performed earlier the same day. 2. 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 3. Redemonstrated small chronic infarct within the left cerebellum. CTA neck: The common carotid, internal carotid and vertebral arteries are patent within the neck without hemodynamically significant stenosis. Mild calcified plaque within the carotid bifurcations/proximal ICAs bilaterally. This includes a focus of ulcerated plaque within the posterior aspect of the left carotid bulb. CTA head: 1. No intracranial large vessel occlusion. 2. Intracranial atherosclerotic disease with multifocal stenoses, most notably as follows. 3. Mild-to-moderate segmental narrowing of the proximal to mid M1 left MCA. 4. High-grade focal stenoses within multiple distal M2 left MCA branch vessels. 5. Moderate/severe segmental stenosis within the A2 right anterior cerebral artery. 6. Moderate/severe focal stenosis within the P1 right PCA 7. Severe focal stenosis within the P2 right PCA. 8. The left PCA is fetal in origin.  Moderate/severe focal  stenoses within the left posterior communicating artery and P2 left PCA. 9. 2 mm vascular protrusion arising from the paraclinoid right ICA which may reflect an infundibulum or small aneurysm. Electronically Signed   By: Jackey Loge DO   On: 04/06/2020 14:34   DG Chest 2 View  Result Date: 04/04/2020 CLINICAL DATA:  Hyperglycemic, suicidal thoughts X 3 days. History of hypertension, diabetes, schizoaffective and bipolar disorder, hepatitis. EXAM: CHEST - 2 VIEW COMPARISON:  Chest x-ray 02/28/2016. chest x-ray 02/03/2013 FINDINGS: The heart size and mediastinal contours are within normal limits. Both lungs are clear. No acute osseous finding. Multilevel degenerative changes of the spine with chronic mild anterior wedge shaped compression deformity of a midthoracic body IMPRESSION: No active cardiopulmonary disease. Electronically Signed   By: Tish Frederickson M.D.   On: 04/04/2020 10:48   CT Head Wo Contrast  Result Date: 04/05/2020 CLINICAL DATA:  Trauma EXAM: CT HEAD WITHOUT CONTRAST CT CERVICAL SPINE WITHOUT CONTRAST TECHNIQUE: Multidetector CT imaging of the head and cervical spine was performed following the standard protocol without intravenous contrast. Multiplanar CT image reconstructions of the cervical spine were also generated. COMPARISON:  01/18/2015 head CT. FINDINGS: CT HEAD FINDINGS Brain: Age indeterminate focal hypodensities involving the left basal ganglia (3:14) and superior left cerebellum (7:46, new since prior exam. Additional scattered and confluent hypodense foci involving the periventricular and subcortical white matter are nonspecific however may reflect chronic microvascular ischemic changes. No mass lesion. No midline shift, ventriculomegaly or extra-axial fluid collection. Mild cerebral atrophy with ex vacuo dilatation. Vascular: No hyperdense vessel or unexpected calcification. Skull: Negative for fracture or focal lesion. Sinuses/Orbits: Normal orbits. Clear  paranasal sinuses. No mastoid effusion. Other: None. CT CERVICAL SPINE FINDINGS Alignment: Straightening of lordosis.  No listhesis. Skull base and vertebrae: No acute fracture. No primary bone lesion or focal pathologic process. Soft tissues and spinal canal: No prevertebral fluid or swelling. No visible canal hematoma. Disc levels: Multilevel spondylosis most prominent at the C5-7 levels. Patent bony spinal canal. Multilevel mild-to-moderate bony neural foraminal narrowing secondary to uncovertebral and facet hypertrophy. Upper chest: Clear lung apices. Other: None. IMPRESSION: Focal hypodensities involving the left basal ganglia and superior left cerebellum are age indeterminate. Consider MRI brain to exclude subacute insults. Mild cerebral atrophy and chronic microvascular ischemic changes. No acute fracture or traumatic listhesis.  Multilevel spondylosis. These results were called by telephone at the time of interpretation on 04/05/2020 at 10:51 am to provider [facility] , who verbally acknowledged these results. Electronically Signed   By: Stana Bunting M.D.   On: 04/05/2020 11:02   CT ANGIO NECK W OR WO CONTRAST  Result Date: 04/06/2020 CLINICAL DATA:  Stroke/TIA, assess extracranial arteries, right internal capsule stroke. EXAM: CT ANGIOGRAPHY HEAD AND NECK TECHNIQUE: Multidetector CT imaging of the head and neck was performed using the standard protocol during bolus administration of intravenous contrast. Multiplanar CT image reconstructions and MIPs were obtained to evaluate the vascular anatomy. Carotid stenosis measurements (when applicable) are obtained utilizing NASCET criteria, using the distal internal carotid diameter as the denominator. CONTRAST:  OMNIPAQUE IOHEXOL 350 MG/ML SOLN COMPARISON:  Brain MRI performed earlier the same day 04/05/2020, prior head CT examinations 04/05/2020 and earlier. FINDINGS: CT HEAD FINDINGS Brain: A punctate acute infarct within the right basal  ganglia/posterior limb of right internal capsule was better appreciated on the brain MRI performed earlier the same day. Advanced ill-defined hypoattenuation within the cerebral white matter consistent with chronic small vessel ischemic disease. Redemonstrated chronic lacunar  infarcts within the bilateral corona radiata/basal ganglia and right thalamus. Also redemonstrated is a small chronic infarct within the left cerebellar hemisphere. There is no acute intracranial hemorrhage. No demarcated cortical infarct is identified. No extra-axial fluid collection. No evidence of intracranial mass. No midline shift. Vascular: Reported below. Skull: Normal. Negative for fracture or focal lesion. Sinuses: Minimal ethmoid and maxillary sinus mucosal thickening. No significant mastoid effusion. Orbits: No acute finding. Review of the MIP images confirms the above findings CTA NECK FINDINGS Aortic arch: Common origin of the innominate and left common carotid arteries. Atherosclerotic plaque within the visualized aortic arch. No hemodynamically significant innominate or proximal subclavian artery stenosis. Right carotid system: CCA and ICA patent within the neck without significant stenosis (50% or greater). Minimal mixed plaque within the prox ICA. Left carotid system: CCA and ICA patent within the neck without significant stenosis (50% or greater). Mild mixed plaque within the carotid bifurcation and proximal ICA. This includes a small focus of ulcerated plaque along the posterior aspect of the carotid bulb (series 14, image 131) Vertebral arteries: Patent bilaterally without significant stenosis. The left vertebral artery is slightly dominant. Skeleton: No acute bony abnormality or aggressive osseous lesion. Cervical spondylosis with multilevel disc space narrowing, posterior disc osteophytes, uncovertebral and facet hypertrophy. Additionally, there are multilevel prominent ventral osteophytes most notably at the C5-C6 and  C6-C7 levels. Other neck: No neck mass or cervical lymphadenopathy. Upper chest: No consolidation within the imaged lung apices. Review of the MIP images confirms the above findings CTA HEAD FINDINGS Anterior circulation: The intracranial internal carotid arteries are patent. Minimal calcified plaque within the intracranial left ICA without stenosis. The M1 middle cerebral arteries are patent without significant stenosis. Mild-to-moderate segmental narrowing of the proximal to mid M1 left MCA (series 15, image 50). No M2 proximal branch occlusion is identified. There are high-grade focal stenoses within multiple distal M2 left MCA branch vessels (for instance as seen on series 17, image 28). The anterior cerebral arteries are patent. Moderate/severe segmental stenosis within the A2 right anterior cerebral artery (series 17, image 22). 2 mm inferiorly projecting vascular protrusion arising from the paraclinoid right ICA, which may reflect an infundibulum or small aneurysm (series 14, image 87). Posterior circulation: The intracranial vertebral arteries are patent. The basilar artery is patent. Fetal origin left posterior cerebral artery. The posterior cerebral arteries are patent bilaterally. There is a moderate focal stenosis within the P1 right PCA. Severe focal stenosis within the proximal P2 right PCA. Moderate/severe focal stenoses within the left posterior communicating artery and P2 left PCA (series 15, image 50). The right posterior communicating artery is hypoplastic or absent. Venous sinuses: Within limitations of contrast timing, no convincing thrombus. Anatomic variants: As described Review of the MIP images confirms the above findings IMPRESSION: CT head: 1. A punctate acute infarct within the right basal ganglia/posterior limb of right internal capsule was better appreciated on the brain MRI performed earlier the same day. 2. Unchanged advanced cerebral white matter chronic small vessel ischemic disease  with multiple chronic lacunar infarcts within the bilateral corona radiata/basal ganglia and right thalamus. 3. Redemonstrated small chronic infarct within the left cerebellum. CTA neck: The common carotid, internal carotid and vertebral arteries are patent within the neck without hemodynamically significant stenosis. Mild calcified plaque within the carotid bifurcations/proximal ICAs bilaterally. This includes a focus of ulcerated plaque within the posterior aspect of the left carotid bulb. CTA head: 1. No intracranial large vessel occlusion. 2. Intracranial atherosclerotic disease with multifocal stenoses,  most notably as follows. 3. Mild-to-moderate segmental narrowing of the proximal to mid M1 left MCA. 4. High-grade focal stenoses within multiple distal M2 left MCA branch vessels. 5. Moderate/severe segmental stenosis within the A2 right anterior cerebral artery. 6. Moderate/severe focal stenosis within the P1 right PCA 7. Severe focal stenosis within the P2 right PCA. 8. The left PCA is fetal in origin. Moderate/severe focal stenoses within the left posterior communicating artery and P2 left PCA. 9. 2 mm vascular protrusion arising from the paraclinoid right ICA which may reflect an infundibulum or small aneurysm. Electronically Signed   By: Jackey Loge DO   On: 04/06/2020 14:34   CT Cervical Spine Wo Contrast  Result Date: 04/05/2020 CLINICAL DATA:  Trauma EXAM: CT HEAD WITHOUT CONTRAST CT CERVICAL SPINE WITHOUT CONTRAST TECHNIQUE: Multidetector CT imaging of the head and cervical spine was performed following the standard protocol without intravenous contrast. Multiplanar CT image reconstructions of the cervical spine were also generated. COMPARISON:  01/18/2015 head CT. FINDINGS: CT HEAD FINDINGS Brain: Age indeterminate focal hypodensities involving the left basal ganglia (3:14) and superior left cerebellum (7:46, new since prior exam. Additional scattered and confluent hypodense foci involving the  periventricular and subcortical white matter are nonspecific however may reflect chronic microvascular ischemic changes. No mass lesion. No midline shift, ventriculomegaly or extra-axial fluid collection. Mild cerebral atrophy with ex vacuo dilatation. Vascular: No hyperdense vessel or unexpected calcification. Skull: Negative for fracture or focal lesion. Sinuses/Orbits: Normal orbits. Clear paranasal sinuses. No mastoid effusion. Other: None. CT CERVICAL SPINE FINDINGS Alignment: Straightening of lordosis.  No listhesis. Skull base and vertebrae: No acute fracture. No primary bone lesion or focal pathologic process. Soft tissues and spinal canal: No prevertebral fluid or swelling. No visible canal hematoma. Disc levels: Multilevel spondylosis most prominent at the C5-7 levels. Patent bony spinal canal. Multilevel mild-to-moderate bony neural foraminal narrowing secondary to uncovertebral and facet hypertrophy. Upper chest: Clear lung apices. Other: None. IMPRESSION: Focal hypodensities involving the left basal ganglia and superior left cerebellum are age indeterminate. Consider MRI brain to exclude subacute insults. Mild cerebral atrophy and chronic microvascular ischemic changes. No acute fracture or traumatic listhesis.  Multilevel spondylosis. These results were called by telephone at the time of interpretation on 04/05/2020 at 10:51 am to provider [facility] , who verbally acknowledged these results. Electronically Signed   By: Stana Bunting M.D.   On: 04/05/2020 11:02   MR Brain Wo Contrast (neuro protocol)  Result Date: 04/05/2020 CLINICAL DATA:  59 year old male status post trauma, neurologic deficit with left side weakness. Age indeterminate ischemia in the left basal ganglia and left cerebellum on plain head CT earlier today. EXAM: MRI HEAD WITHOUT CONTRAST TECHNIQUE: Multiplanar, multiecho pulse sequences of the brain and surrounding structures were obtained without intravenous contrast.  COMPARISON:  Head CT 1029 hours today. FINDINGS: Brain: Questionable punctate focus of restricted diffusion at the dorsal right lentiform near the posterior limb of the right internal capsule, occurring in an area of chronic small vessel ischemia and encephalomalacia (series 5, image 80 and series 8, image 15). No other restricted diffusion. 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 Moderate patchy T2 hyperintensity in the right paracentral pons also most resembles chronic small-vessel ischemia. No cortical encephalomalacia or definite chronic blood products. No midline shift, mass effect,  evidence of mass lesion, ventriculomegaly, extra-axial collection or acute intracranial hemorrhage. Cervicomedullary junction and pituitary are within normal limits. Vascular: Major intracranial vascular flow voids are preserved. Skull and upper cervical spine: Negative for age visible cervical spine, bone marrow signal. Sinuses/Orbits: Negative orbits. Paranasal sinuses and mastoids are stable and well pneumatized. Other: There is a small volume of fluid trapped in the left petrous apex (series 10, image 12) with facilitated diffusion and no complicating features. Otherwise grossly normal visible internal auditory structures. Scalp and face appear negative. IMPRESSION: 1. Suspect punctate acute on chronic lacunar infarct at the dorsal right lentiform / posterior limb of the right internal capsule. No associated hemorrhage or mass effect. 2. Underlying advanced chronic small vessel disease, with chronic ischemia in the left basal ganglia and the left cerebellum SCA territory corresponding to head CT findings earlier today. Electronically Signed   By: Odessa Fleming M.D.   On: 04/05/2020 13:44   DG Knee Complete 4 Views  Left  Result Date: 04/05/2020 CLINICAL DATA:  Left knee pain after fall. EXAM: LEFT KNEE - COMPLETE 4+ VIEW COMPARISON:  None. FINDINGS: No evidence of fracture, dislocation, or joint effusion. No evidence of arthropathy or other focal bone abnormality. Soft tissues are unremarkable. IMPRESSION: Negative. Electronically Signed   By: Lupita Raider M.D.   On: 04/05/2020 11:03   ECHOCARDIOGRAM COMPLETE  Result Date: 04/06/2020    ECHOCARDIOGRAM REPORT   Patient Name:   [name] Date of Exam: 04/06/2020 Medical Rec #:  [id number]      Height:       69.0 in Accession #:    [id number]     Weight:       175.0 lb Date of Birth:  1961/06/09       BSA:          1.952 m Patient Age:    59 years       BP:           125/70 mmHg Patient Gender: M              HR:           68 bpm. Exam Location:  Inpatient Procedure: 2D Echo, Cardiac Doppler, Color Doppler and Strain Analysis Indications:    Stroke  History:        Patient has prior history of Echocardiogram examinations, most                 recent 02/29/2016. Stroke, Signs/Symptoms:Chest Pain; Risk                 Factors:Diabetes and Dyslipidemia. Cocaine use.  Sonographer:    Sheralyn Boatman RDCS Referring Phys: [id number] RONDELL A SMITH  Sonographer Comments: Technically difficult study due to poor echo windows. Global longitudinal strain was attempted. IMPRESSIONS  1. Left ventricular ejection fraction, by estimation, is 55 to 60%. The left ventricle has normal function. The left ventricle has no regional wall motion abnormalities. There is moderate left ventricular hypertrophy. Left ventricular diastolic parameters are consistent with Grade II diastolic dysfunction (pseudonormalization). The average left ventricular global longitudinal strain is -18.1 %.  2. Right ventricular systolic function is normal. The right ventricular size is normal.  3. The mitral valve is grossly normal. Trivial mitral valve regurgitation.  4. The aortic valve is grossly normal. Aortic valve  regurgitation is not visualized. No aortic stenosis is present. FINDINGS  Left Ventricle: Left ventricular ejection fraction, by estimation, is 55 to 60%. The left ventricle has  normal function. The left ventricle has no regional wall motion abnormalities. The average left ventricular global longitudinal strain is -18.1 %. The left ventricular internal cavity size was normal in size. There is moderate left ventricular hypertrophy. Left ventricular diastolic parameters are consistent with Grade II diastolic dysfunction (pseudonormalization). Right Ventricle: The right ventricular size is normal. No increase in right ventricular wall thickness. Right ventricular systolic function is normal. Left Atrium: Left atrial size was normal in size. Right Atrium: Right atrial size was normal in size. Pericardium: There is no evidence of pericardial effusion. Mitral Valve: The mitral valve is grossly normal. Trivial mitral valve regurgitation. Tricuspid Valve: The tricuspid valve is grossly normal. Tricuspid valve regurgitation is trivial. Aortic Valve: The aortic valve is grossly normal. Aortic valve regurgitation is not visualized. No aortic stenosis is present. Pulmonic Valve: The pulmonic valve was normal in structure. Pulmonic valve regurgitation is not visualized. Aorta: The aortic root and ascending aorta are structurally normal, with no evidence of dilitation. IAS/Shunts: The atrial septum is grossly normal.  LEFT VENTRICLE PLAX 2D LVIDd:         3.60 cm     Diastology LVIDs:         2.50 cm     LV e' medial:    7.07 cm/s LV PW:         1.70 cm     LV E/e' medial:  9.5 LV IVS:        1.30 cm     LV e' lateral:   10.60 cm/s LVOT diam:     1.90 cm     LV E/e' lateral: 6.3 LV SV:         45 LV SV Index:   23          2D Longitudinal Strain LVOT Area:     2.84 cm    2D Strain GLS Avg:     -18.1 %  LV Volumes (MOD) LV vol d, MOD A2C: 47.7 ml LV vol d, MOD A4C: 71.2 ml LV vol s, MOD A2C: 23.2 ml LV vol s, MOD A4C: 29.2 ml LV  SV MOD A2C:     24.5 ml LV SV MOD A4C:     71.2 ml LV SV MOD BP:      32.5 ml RIGHT VENTRICLE             IVC RV S prime:     10.90 cm/s  IVC diam: 1.80 cm TAPSE (M-mode): 1.9 cm LEFT ATRIUM             Index      RIGHT ATRIUM           Index LA diam:        4.00 cm 2.05 cm/m RA Area:     11.70 cm LA Vol (A2C):   13.0 ml 6.66 ml/m RA Volume:   25.20 ml  12.91 ml/m LA Vol (A4C):   18.2 ml 9.32 ml/m LA Biplane Vol: 16.8 ml 8.61 ml/m  AORTIC VALVE LVOT Vmax:   89.20 cm/s LVOT Vmean:  63.900 cm/s LVOT VTI:    0.159 m  AORTA Ao Root diam: 3.30 cm Ao Asc diam:  3.20 cm MITRAL VALVE MV Area (PHT): 2.73 cm    SHUNTS MV Decel Time: 278 msec    Systemic VTI:  0.16 m MV E velocity: 67.20 cm/s  Systemic Diam: 1.90 cm MV A velocity: 50.00 cm/s MV E/A ratio:  1.34 Kristeen Miss MD Electronically signed by Loistine Chance  Nahser MD Signature Date/Time: 04/06/2020/12:47:27 PM    Final    VAS US CAROTID  Result Date: 04/06/2020 Carotid Arterial Duplex Study Indications:       CVA. Risk Factors:      Prior CVA. Comparison Study:  No prior studies. Performing Technologist: Chanda Busing RVT  Examination Guidelines: A complete evaluation includes B-mode imaging, spectral Doppler, color Doppler, and power Doppler as needed of all accessible portions of each vessel. Bilateral testing is considered an integral part of a complete examination. Limited examinations for reoccurring indications may be performed as noted.  Right Carotid Findings: +----------+--------+--------+--------+-----------------------+--------+           PSV cm/sEDV cm/sStenosisPlaque Description     Comments +----------+--------+--------+--------+-----------------------+--------+ CCA Prox  85      17              smooth and heterogenous         +----------+--------+--------+--------+-----------------------+--------+ CCA Distal42      14              smooth and heterogenous          +----------+--------+--------+--------+-----------------------+--------+ ICA Prox  38      16              smooth and heterogenous         +----------+--------+--------+--------+-----------------------+--------+ ICA Distal58      21                                              +----------+--------+--------+--------+-----------------------+--------+ ECA       87      16                                              +----------+--------+--------+--------+-----------------------+--------+ +----------+--------+-------+--------+-------------------+           PSV cm/sEDV cmsDescribeArm Pressure (mmHG) +----------+--------+-------+--------+-------------------+ [id number]                                        +----------+--------+-------+--------+-------------------+ +---------+--------+--+--------+--+---------+ VertebralPSV cm/s32EDV cm/s13Antegrade +---------+--------+--+--------+--+---------+  Left Carotid Findings: +----------+--------+--------+--------+-----------------------+--------+           PSV cm/sEDV cm/sStenosisPlaque Description     Comments +----------+--------+--------+--------+-----------------------+--------+ CCA Prox  75      15              smooth and heterogenous         +----------+--------+--------+--------+-----------------------+--------+ CCA Distal31      8               smooth and heterogenous         +----------+--------+--------+--------+-----------------------+--------+ ICA Prox  37      14              smooth and heterogenous         +----------+--------+--------+--------+-----------------------+--------+ ICA Distal84      36                                              +----------+--------+--------+--------+-----------------------+--------+ ECA       77  18                                              +----------+--------+--------+--------+-----------------------+--------+  +----------+--------+--------+--------+-------------------+           PSV cm/sEDV cm/sDescribeArm Pressure (mmHG) +----------+--------+--------+--------+-------------------+ [id number]                                          +----------+--------+--------+--------+-------------------+ +---------+--------+--+--------+--+---------+ VertebralPSV cm/s46EDV cm/s14Antegrade +---------+--------+--+--------+--+---------+   Summary: Right Carotid: Velocities in the right ICA are consistent with a 1-39% stenosis. Left Carotid: Velocities in the left ICA are consistent with a 1-39% stenosis. Vertebrals: Bilateral vertebral arteries demonstrate antegrade flow. *See table(s) above for measurements and observations.  Electronically signed by Sherald Hess MD on 04/06/2020 at 4:37:52 PM.    Final        Subjective: Patient seen and examined at bedside.  Poor historian.  Does not engage in conversation much.  No overnight fever, vomiting or agitation reported.  Sitter present at bedside  Discharge Exam: Vitals:   04/08/20 0550 04/08/20 0856  BP: 123/75 104/72  Pulse: 77 72  Resp: 20 18  Temp: 98.1 F (36.7 C) 97.7 F (36.5 C)  SpO2: 98% 100%    General: Pt is alert, awake, not in acute distress.  Looks chronically ill/does not communicate much. Cardiovascular: rate controlled, S1/S2 + Respiratory: bilateral decreased breath sounds at bases Abdominal: Soft, NT, ND, bowel sounds + Extremities: Trace lower extremity edema; no cyanosis    The results of significant diagnostics from this hospitalization (including imaging, microbiology, ancillary and laboratory) are listed below for reference.     Microbiology: Recent Results (from the past 240 hour(s))  SARS Coronavirus 2 by RT PCR (hospital order, performed in [facility] hospital lab) Nasopharyngeal Nasopharyngeal Swab     Status: None   Collection Time: 04/04/20 10:12 AM   Specimen: Nasopharyngeal Swab  Result Value Ref  Range Status   SARS Coronavirus 2 NEGATIVE NEGATIVE Final    Comment: (NOTE) SARS-CoV-2 target nucleic acids are NOT DETECTED.  The SARS-CoV-2 RNA is generally detectable in upper and lower respiratory specimens during the acute phase of infection. The lowest concentration of SARS-CoV-2 viral copies this assay can detect is 250 copies / mL. A negative result does not preclude SARS-CoV-2 infection and should not be used as the sole basis for treatment or other patient management decisions.  A negative result may occur with improper specimen collection / handling, submission of specimen other than nasopharyngeal swab, presence of viral mutation(s) within the areas targeted by this assay, and inadequate number of viral copies (<250 copies / mL). A negative result must be combined with clinical observations, patient history, and epidemiological information.  Fact Sheet for Patients:   BoilerBrush.com.cy  Fact Sheet for Healthcare Providers: https://pope.com/  This test is not yet approved or  cleared by the [place] FDA and has been authorized for detection and/or diagnosis of SARS-CoV-2 by FDA under an Emergency Use Authorization (EUA).  This EUA will remain in effect (meaning this test can be used) for the duration of the COVID-19 declaration under Section 564(b)(1) of the Act, 21 U.S.C. section 360bbb-3(b)(1), unless the authorization is terminated or revoked sooner.  Performed at [facility], [address], [place], [place] [address]  SARS Coronavirus 2 by RT PCR (hospital order, performed in [facility] hospital lab) Nasopharyngeal Nasopharyngeal Swab     Status: None   Collection Time: 04/06/20  5:54 PM   Specimen: Nasopharyngeal Swab  Result Value Ref Range Status   SARS Coronavirus 2 NEGATIVE NEGATIVE Final    Comment: (NOTE) SARS-CoV-2 target nucleic acids are NOT DETECTED.  The SARS-CoV-2 RNA is generally  detectable in upper and lower respiratory specimens during the acute phase of infection. The lowest concentration of SARS-CoV-2 viral copies this assay can detect is 250 copies / mL. A negative result does not preclude SARS-CoV-2 infection and should not be used as the sole basis for treatment or other patient management decisions.  A negative result may occur with improper specimen collection / handling, submission of specimen other than nasopharyngeal swab, presence of viral mutation(s) within the areas targeted by this assay, and inadequate number of viral copies (<250 copies / mL). A negative result must be combined with clinical observations, patient history, and epidemiological information.  Fact Sheet for Patients:   BoilerBrush.com.cy  Fact Sheet for Healthcare Providers: https://pope.com/  This test is not yet approved or  cleared by the [place] FDA and has been authorized for detection and/or diagnosis of SARS-CoV-2 by FDA under an Emergency Use Authorization (EUA).  This EUA will remain in effect (meaning this test can be used) for the duration of the COVID-19 declaration under Section 564(b)(1) of the Act, 21 U.S.C. section 360bbb-3(b)(1), unless the authorization is terminated or revoked sooner.  Performed at [facility], [address], [place], [place] [address]      Labs: BNP (last 3 results) No results for input(s): BNP in the last 8760 hours. Basic Metabolic Panel: Recent Labs  Lab 04/04/20 0721 04/05/20 1055 04/06/20 0403  NA 136 135 137  K 4.5 4.2 3.3*  CL 101 99 105  CO2 20* 23 24  GLUCOSE 397* 170* 116*  BUN 22* 25* 20  CREATININE 1.27* 1.07 0.91  CALCIUM 10.2 9.5 8.6*  MG  --   --  1.4*  PHOS  --   --  3.7   Liver Function Tests: Recent Labs  Lab 04/04/20 1133 04/05/20 1055  AST 27 50*  ALT 17 24  ALKPHOS 57 65  BILITOT 1.4* 1.1  PROT 7.2 7.7  ALBUMIN 3.3* 3.8    No results for input(s): LIPASE, AMYLASE in the last 168 hours. No results for input(s): AMMONIA in the last 168 hours. CBC: Recent Labs  Lab 04/04/20 0721 04/05/20 1055 04/06/20 0403  WBC 11.2* 8.2 5.5  NEUTROABS  --  5.5  --   HGB 12.8* 13.0 10.5*  HCT 38.6* 41.2 30.5*  MCV 86.2 90.4 85.2  PLT 245 204 201   Cardiac Enzymes: No results for input(s): CKTOTAL, CKMB, CKMBINDEX, TROPONINI in the last 168 hours. BNP: Invalid input(s): POCBNP CBG: Recent Labs  Lab 04/07/20 0923 04/07/20 1153 04/07/20 1637 04/07/20 2033 04/08/20 0735  GLUCAP 208* 157* 278* 246* 153*   D-Dimer No results for input(s): DDIMER in the last 72 hours. Hgb A1c No results for input(s): HGBA1C in the last 72 hours. Lipid Profile No results for input(s): CHOL, HDL, LDLCALC, TRIG, CHOLHDL, LDLDIRECT in the last 72 hours. Thyroid function studies No results for input(s): TSH, T4TOTAL, T3FREE, THYROIDAB in the last 72 hours.  Invalid input(s): FREET3 Anemia work up No results for input(s): VITAMINB12, FOLATE, FERRITIN, TIBC, IRON, RETICCTPCT in the last 72 hours. Urinalysis  Component Value Date/Time   COLORURINE YELLOW 04/05/2020 2200   APPEARANCEUR CLEAR 04/05/2020 2200   LABSPEC 1.020 04/05/2020 2200   PHURINE 5.0 04/05/2020 2200   GLUCOSEU >=500 (A) 04/05/2020 2200   HGBUR NEGATIVE 04/05/2020 2200   BILIRUBINUR NEGATIVE 04/05/2020 2200   KETONESUR NEGATIVE 04/05/2020 2200   PROTEINUR NEGATIVE 04/05/2020 2200   UROBILINOGEN 0.2 02/03/2013 1018   NITRITE NEGATIVE 04/05/2020 2200   LEUKOCYTESUR NEGATIVE 04/05/2020 2200   Sepsis Labs Invalid input(s): PROCALCITONIN,  WBC,  LACTICIDVEN Microbiology Recent Results (from the past 240 hour(s))  SARS Coronavirus 2 by RT PCR (hospital order, performed in [facility] hospital lab) Nasopharyngeal Nasopharyngeal Swab     Status: None   Collection Time: 04/04/20 10:12 AM   Specimen: Nasopharyngeal Swab  Result Value Ref Range Status   SARS  Coronavirus 2 NEGATIVE NEGATIVE Final    Comment: (NOTE) SARS-CoV-2 target nucleic acids are NOT DETECTED.  The SARS-CoV-2 RNA is generally detectable in upper and lower respiratory specimens during the acute phase of infection. The lowest concentration of SARS-CoV-2 viral copies this assay can detect is 250 copies / mL. A negative result does not preclude SARS-CoV-2 infection and should not be used as the sole basis for treatment or other patient management decisions.  A negative result may occur with improper specimen collection / handling, submission of specimen other than nasopharyngeal swab, presence of viral mutation(s) within the areas targeted by this assay, and inadequate number of viral copies (<250 copies / mL). A negative result must be combined with clinical observations, patient history, and epidemiological information.  Fact Sheet for Patients:   BoilerBrush.com.cy  Fact Sheet for Healthcare Providers: https://pope.com/  This test is not yet approved or  cleared by the [place] FDA and has been authorized for detection and/or diagnosis of SARS-CoV-2 by FDA under an Emergency Use Authorization (EUA).  This EUA will remain in effect (meaning this test can be used) for the duration of the COVID-19 declaration under Section 564(b)(1) of the Act, 21 U.S.C. section 360bbb-3(b)(1), unless the authorization is terminated or revoked sooner.  Performed at [facility], [address], [place], [place] [address]   SARS Coronavirus 2 by RT PCR (hospital order, performed in [facility] hospital lab) Nasopharyngeal Nasopharyngeal Swab     Status: None   Collection Time: 04/06/20  5:54 PM   Specimen: Nasopharyngeal Swab  Result Value Ref Range Status   SARS Coronavirus 2 NEGATIVE NEGATIVE Final    Comment: (NOTE) SARS-CoV-2 target nucleic acids are NOT DETECTED.  The SARS-CoV-2 RNA is generally detectable in upper and  lower respiratory specimens during the acute phase of infection. The lowest concentration of SARS-CoV-2 viral copies this assay can detect is 250 copies / mL. A negative result does not preclude SARS-CoV-2 infection and should not be used as the sole basis for treatment or other patient management decisions.  A negative result may occur with improper specimen collection / handling, submission of specimen other than nasopharyngeal swab, presence of viral mutation(s) within the areas targeted by this assay, and inadequate number of viral copies (<250 copies / mL). A negative result must be combined with clinical observations, patient history, and epidemiological information.  Fact Sheet for Patients:   BoilerBrush.com.cy  Fact Sheet for Healthcare Providers: https://pope.com/  This test is not yet approved or  cleared by the [place] FDA and has been authorized for detection and/or diagnosis of SARS-CoV-2 by FDA under an Emergency Use Authorization (EUA).  This EUA  will remain in effect (meaning this test can be used) for the duration of the COVID-19 declaration under Section 564(b)(1) of the Act, 21 U.S.C. section 360bbb-3(b)(1), unless the authorization is terminated or revoked sooner.  Performed at [facility], [address] [place], [place] [id number]      Time coordinating discharge: 35 minutes  SIGNED:   Glade LloydKshitiz Daulton Harbaugh, MD  [facility] 04/08/2020, 11:35 AM

## 2020-04-08 NOTE — Evaluation (Signed)
Speech Language Pathology Evaluation Patient Details Name: [name] MRN: [id number] DOB: 06-Feb-1961 Today's Date: 04/08/2020 Time: 0093-8182 SLP Time Calculation (min) (ACUTE ONLY): 15 min  Problem List:  Patient Active Problem List   Diagnosis Date Noted  . CVA (cerebral vascular accident) (HCC) 04/05/2020  . Frequent falls 04/05/2020  . Cocaine use disorder (HCC) 01/02/2020  . 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 Homelessness 01/02/2020  . Cocaine use disorder, moderate, dependence (HCC) 10/29/2016  . DKA (diabetic ketoacidoses) (HCC) 02/28/2016  . Chest pain 02/28/2016  . AKI (acute kidney injury) (HCC) 02/28/2016  . Suicidal ideation 02/06/2016  . Cannabis use disorder, moderate, dependence (HCC) 02/06/2016  . Schizoaffective disorder, bipolar type (HCC) 02/05/2016  . Hyperlipidemia 01/18/2015  . Hyperprolactinemia (HCC) 01/18/2015  . Diabetes mellitus (HCC) 03/11/2012  . HTN (hypertension) 03/11/2012   Past Medical History:  Past Medical History:  Diagnosis Date  . Anxiety   . Bipolar affective disorder (HCC)   . Bipolar disorder (HCC) 03/11/2012  . Depression   . Diabetes mellitus   . Diabetes mellitus (HCC) 03/11/2012  . Hepatitis 06/30/2013   Type C  . Hypertension   . Schizophrenia, schizo-affective (HCC)    Past Surgical History:  Past Surgical History:  Procedure Laterality Date  . FINGER SURGERY    . WISDOM TOOTH EXTRACTION     HPI:  59 year old male with history of diabetes mellitus type 2, hepatitis C, bipolar disorder presented with suicidal ideations and falls.  He reportedly attempted to place a gun to his head and pulled the trigger, but it was jammed.  Patient did admit to recently using cocaine and alcohol 6 days ago.  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   Assessment / Plan /  Recommendation Clinical Impression  Patient presents with a moderate cognitive-linguistic impairment with most significant areas of impairment being attention, working memory, poor frustration toleration, decreased pragmatic language abilities (flat affect, monotone voice, no eye contact). He was oriented x4 however only recalled 1/5 words after 2 minute delay. When responding to other questions (comparing objects, verbal reasoning for hypothetical problems, basic mathematical calculations, etc. he would appear to attempt to solve but then if he did not give an answer quickly would say "I dont know". When SLP would ask him if he remembered the question asked he would say no, but was able to demonstrate some recall with semantic cues, partial phrase cues. Patient was very impulsive, appeared apathetic but when asked he did endorse being frustrated by his new impairments in attention and memory. Although patient would benefit from cognitive-linguistic therapy, SLP is recommending that this be completed at next venue of care secondary to patient's current likely poor participation at this acute care venue.    SLP Assessment  SLP Recommendation/Assessment: All further Speech Lanaguage Pathology  needs can be addressed in the next venue of care SLP Visit Diagnosis: Cognitive communication deficit (R41.841)    Follow Up Recommendations  Skilled Nursing facility;24 hour supervision/assistance    Frequency and Duration   n/A        SLP Evaluation Cognition  Overall Cognitive Status: No family/caregiver present to determine baseline cognitive functioning Arousal/Alertness: Awake/alert Orientation Level: Oriented X4 Attention: Sustained Sustained Attention: Impaired Sustained Attention Impairment: Verbal basic;Functional basic Memory: Impaired Memory Impairment: Storage deficit;Decreased recall of new information Executive Function: Initiating Initiating: Impaired Initiating Impairment: Verbal  basic;Verbal complex Behaviors:  Restless;Poor frustration tolerance;Impulsive Safety/Judgment: Impaired       Comprehension  Auditory Comprehension Overall Auditory Comprehension: Appears within functional limits for tasks assessed Yes/No Questions: Within Functional Limits Commands: Within Functional Limits Conversation: Simple Interfering Components: Attention;Processing speed;Working Radio broadcast assistant: Dietitian: Not tested Reading Comprehension Reading Status: Not tested    Expression Expression Primary Mode of Expression: Verbal Verbal Expression Overall Verbal Expression: Impaired Initiation: Impaired Level of Generative/Spontaneous Verbalization: Phrase;Sentence Repetition: No impairment Naming: No impairment Pragmatics: Impairment Impairments: Abnormal affect;Eye contact;Monotone Interfering Components: Attention Non-Verbal Means of Communication: Not applicable   Oral / Motor  Oral Motor/Sensory Function Overall Oral Motor/Sensory Function: Within functional limits   GO                   Angela Nevin, MA, CCC-SLP 04/08/20 6:04 PM

## 2020-04-09 LAB — GLUCOSE, CAPILLARY
Glucose-Capillary: 135 mg/dL — ABNORMAL HIGH (ref 70–99)
Glucose-Capillary: 260 mg/dL — ABNORMAL HIGH (ref 70–99)
Glucose-Capillary: 120 mg/dL — ABNORMAL HIGH (ref 70–99)
Glucose-Capillary: 306 mg/dL — ABNORMAL HIGH (ref 70–99)

## 2020-04-09 MED ORDER — IBUPROFEN 200 MG PO TABS
400.0000 mg | ORAL_TABLET | Freq: Four times a day (QID) | ORAL | Status: DC | PRN
  Administered 2020-04-09 – 2020-04-15 (×5): 400 mg via ORAL
  Filled 2020-04-09 (×5): qty 2

## 2020-04-09 MED ORDER — INSULIN ASPART 100 UNIT/ML ~~LOC~~ SOLN
5.0000 [IU] | Freq: Three times a day (TID) | SUBCUTANEOUS | Status: DC
  Administered 2020-04-09 (×2): 5 [IU] via SUBCUTANEOUS

## 2020-04-09 NOTE — TOC Progression Note (Signed)
Transition of Care [facility]) - Progression Note    Patient Details  Name: [name] MRN: [id number] Date of Birth: 04-18-1961  [facility]) CM/SW Contact  Geni Bers, RN Phone Number: 04/09/2020, 3:27 PM  Clinical Narrative:    There are no [facility] beds at present time.   Expected Discharge Plan: Skilled Nursing Facility Barriers to Discharge: Continued Medical Work up  Expected Discharge Plan and Services Expected Discharge Plan: Skilled Nursing Facility In-house Referral: Clinical Social Work Discharge Planning Services: CM Consult Post Acute Care Choice: Skilled Nursing Facility Living arrangements for the past 2 months: Single Family Home Expected Discharge Date: 04/08/20                                     Social Determinants of Health (SDOH) Interventions    Readmission Risk Interventions No flowsheet data found.

## 2020-04-09 NOTE — Plan of Care (Signed)
  Problem: Activity: Goal: Risk for activity intolerance will decrease Outcome: Progressing   Problem: Pain Managment: Goal: General experience of comfort will improve Outcome: Progressing   Problem: Education: Goal: Knowledge of General Education information will improve Description: Including pain rating scale, medication(s)/side effects and non-pharmacologic comfort measures Outcome: Completed/Met   

## 2020-04-09 NOTE — Care Management Important Message (Signed)
Important Message  Patient Details  IM Letter given to the Patient Name: [name] MRN: [id number] Date of Birth: [birth year]   Medicare Important Message Given:  Yes     Caren Macadam 04/09/2020, 10:48 AM

## 2020-04-09 NOTE — Care Management Important Message (Signed)
Important Message  Patient Details IM Letter given to the Patient Name: [name] MRN: [id number] Date of Birth: 1961-01-18   Medicare Important Message Given:  Yes     Caren Macadam 04/09/2020, 10:49 AM

## 2020-04-09 NOTE — Progress Notes (Signed)
Upon assessment, patient complained of aching with a pain level of 10/10 for left arm and left leg. Notified MD due to patient stating that PRN Tylenol does not help. MD gave a PRN order for Ibuprofen. Patient is alert and oriented. Patient is calm and cooperative. Patient was given PRN Ibuprofen for pain. Followed-up on pain medication and patient stated that pain medication helped some and worked a little better than the PRN Tylenol. Patient still has the same pain level 10/10, but patient refuses further pain interventions. Sitter is in the room with patient and is assisting patient with a bath.

## 2020-04-09 NOTE — Progress Notes (Signed)
Patient ID: [name], male   DOB: [birth year], 59 y.o.   MRN: [id number] Patient supposedly discharged to inpatient psychiatric facility on 04/08/2020 pending bed availability.  Patient seen and examined at bedside.  Complains of some right extremity pain.  Currently medically stable for discharge.  Please refer to the discharge summary done by me on 04/08/2020 for full details.

## 2020-04-10 LAB — GLUCOSE, CAPILLARY
Glucose-Capillary: 241 mg/dL — ABNORMAL HIGH (ref 70–99)
Glucose-Capillary: 152 mg/dL — ABNORMAL HIGH (ref 70–99)
Glucose-Capillary: 82 mg/dL (ref 70–99)
Glucose-Capillary: 316 mg/dL — ABNORMAL HIGH (ref 70–99)
Glucose-Capillary: 227 mg/dL — ABNORMAL HIGH (ref 70–99)

## 2020-04-10 MED ORDER — INSULIN ASPART 100 UNIT/ML ~~LOC~~ SOLN
8.0000 [IU] | Freq: Three times a day (TID) | SUBCUTANEOUS | Status: DC
  Administered 2020-04-10 – 2020-04-18 (×23): 8 [IU] via SUBCUTANEOUS

## 2020-04-10 MED ORDER — INSULIN DETEMIR 100 UNIT/ML ~~LOC~~ SOLN
45.0000 [IU] | Freq: Every day | SUBCUTANEOUS | Status: DC
  Administered 2020-04-10 – 2020-04-17 (×8): 45 [IU] via SUBCUTANEOUS
  Filled 2020-04-10 (×8): qty 0.45

## 2020-04-10 MED ORDER — INSULIN DETEMIR 100 UNIT/ML ~~LOC~~ SOLN
45.0000 [IU] | Freq: Every day | SUBCUTANEOUS | Status: AC
Start: 2020-04-10 — End: ?

## 2020-04-10 NOTE — Progress Notes (Signed)
Green tape not placed on blinds or in bathroom over cords.   RN completed placing green tape over bathroom string and blind cord.   Education provided to shift sitter: [employer], [place].    Will continue to monitor/assess patient.   [place], Charity fundraiser

## 2020-04-10 NOTE — Progress Notes (Signed)
Occupational Therapy Treatment Patient Details Name: [name] MRN: [id number] DOB: 04-23-1961 Today's Date: 04/10/2020    History of present illness 59 year old male with history of diabetes mellitus type 2, hepatitis C, bipolar disorder presented with suicidal ideations and falls.  He reportedly attempted to place a gun to his head and pulled the trigger, but it was jammed.  Patient did admit to recently using cocaine and alcohol 6 days ago.  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   OT comments  Pt with good participation with OT.  Pt agreeable and wants to get well  Follow Up Recommendations  SNF    Equipment Recommendations  3 in 1 bedside commode    Recommendations for Other Services      Precautions / Restrictions Precautions Precautions: Fall Precaution Comments: s/p CVA mild/slight L hemi       Mobility Bed Mobility Overal bed mobility: Modified Independent                Transfers Overall transfer level: Needs assistance Equipment used: Straight cane Transfers: Sit to/from Stand;Stand Pivot Transfers Sit to Stand: Min assist Stand pivot transfers: Min assist       General transfer comment: pt has used a straight cane since his stroke but also reports multiple falls.  Pt not interested in using a walker given his age/appearance.  Pt would benefit from a Rollator Walker.    Balance Overall balance assessment: Needs assistance;History of Falls   Sitting balance-Leahy Scale: Good     Standing balance support: No upper extremity supported Standing balance-Leahy Scale: Fair                             ADL either performed or assessed with clinical judgement   ADL Overall ADL's : Needs assistance/impaired     Grooming: Set up;Sitting   Upper Body Bathing: Set up;Sitting   Lower Body Bathing: Minimal  assistance;Sitting/lateral leans;Sit to/from stand;Cueing for safety   Upper Body Dressing : Set up;Sitting   Lower Body Dressing: Sitting/lateral leans;Sit to/from stand;Minimal assistance   Toilet Transfer: Ambulation;Minimal assistance;BSC   Toileting- Clothing Manipulation and Hygiene: Minimal assistance;Sitting/lateral lean;Sit to/from stand       Functional mobility during ADLs: Minimal assistance;Cane;Cueing for sequencing       Vision Patient Visual Report: No change from baseline            Cognition Arousal/Alertness: Awake/alert Behavior During Therapy: WFL for tasks assessed/performed Overall Cognitive Status: Within Functional Limits for tasks assessed                                 General Comments: AxO x 3 very pleasant and conversive.  Shared the loss of his mother and older sister which he feels he is still grieving. Stated he had to move in with his daughter 'temporarily" and all she wants is his money.  Admits to a good relationship with daughter and his 2 grandkids but "they have their own lives" 2 teenagers.  Pt admits feeling "lost" and wants to "get my own place".                   Pertinent Vitals/ Pain       Pain Assessment: No/denies pain Pain Score: 3  Faces Pain Scale: Hurts  a little bit Pain Location: l knee Pain Descriptors / Indicators: Sore Pain Intervention(s): Limited activity within patient's tolerance         Frequency  Min 2X/week        Progress Toward Goals  OT Goals(current goals can now be found in the care plan section)  Progress towards OT goals: Progressing toward goals     Plan Discharge plan remains appropriate    Co-evaluation                 AM-PAC OT "6 Clicks" Daily Activity     Outcome Measure   Help from another person eating meals?: A Little Help from another person taking care of personal grooming?: A Little Help from another person toileting, which includes using toliet,  bedpan, or urinal?: A Little Help from another person bathing (including washing, rinsing, drying)?: A Little Help from another person to put on and taking off regular upper body clothing?: A Little Help from another person to put on and taking off regular lower body clothing?: A Little 6 Click Score: 18    End of Session Equipment Utilized During Treatment: Gait belt  OT Visit Diagnosis: Unsteadiness on feet (R26.81);Hemiplegia and hemiparesis;History of falling (Z91.81);Muscle weakness (generalized) (M62.81);Repeated falls (R29.6) Hemiplegia - dominant/non-dominant: Non-Dominant Hemiplegia - caused by: Cerebral infarction   Activity Tolerance Patient tolerated treatment well   Patient Left in bed;with call bell/phone within reach;with nursing/sitter in room;with bed alarm set   Nurse Communication Mobility status        Time: 4854-6270 OT Time Calculation (min): 23 min  Charges: OT General Charges $OT Visit: 1 Visit OT Treatments $Self Care/Home Management : 23-37 mins  Lise Auer, OT Acute Rehabilitation Services [phone] Office- [phone]      [name], [name] D 04/10/2020, 3:24 PM

## 2020-04-10 NOTE — TOC Progression Note (Signed)
Transition of Care [facility]) - Progression Note    Patient Details  Name: [name] MRN: [id number] Date of Birth: 09/07/1960  Transition of Care [facility]) CM/SW Contact  Geni Bers, RN Phone Number: 04/10/2020, 10:53 AM  Clinical Narrative:    [facility] was called for a bed. Pt is under review now at [facility].   Expected Discharge Plan: Skilled Nursing Facility Barriers to Discharge: Continued Medical Work up  Expected Discharge Plan and Services Expected Discharge Plan: Skilled Nursing Facility In-house Referral: Clinical Social Work Discharge Planning Services: CM Consult Post Acute Care Choice: Skilled Nursing Facility Living arrangements for the past 2 months: Single Family Home Expected Discharge Date: 04/08/20                                     Social Determinants of Health (SDOH) Interventions    Readmission Risk Interventions No flowsheet data found.

## 2020-04-10 NOTE — Discharge Summary (Signed)
Physician Discharge Summary  [name] SWF:[id number] DOB: 09/08/1960 DOA: 04/05/2020  PCP: System, Provider Not In  Admit date: 04/05/2020 Discharge date: 04/10/2020  Admitted From: Home Disposition: Inpatient psychiatric hospital  Recommendations for Outpatient Follow-up:  1. Follow up with provider at inpatient psychiatric hospital at earliest convenience over discharge 2. Outpatient follow-up with neurology 3. Follow up in ED if symptoms worsen or new appear   Home Health: No Equipment/Devices: None  Discharge Condition: Stable CODE STATUS: Full Diet recommendation: Heart healthy/carb modified  Brief/Interim Summary: 59 year old male with history of diabetes mellitus type 2, hepatitis C, bipolar disorder presented with suicidal ideations and falls.  He reportedly attempted to place a gun to his head and pulled the trigger, but it was jammed.  Patient did admit to recently using cocaine and alcohol 6 days ago.  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  Neurology/psychiatry were consulted.  Neurology recommended aspirin and Plavix for 3 months along with Lipitor with outpatient follow-up with neurology.  Psychiatry recommended inpatient psychiatric hospitalization.  Patient will be discharged to inpatient psychiatric facility once bed is available.  He is currently medically stable for discharge.  Patient was supposed to be discharged to inpatient psychiatric facility on 04/08/2020 pending bed availability. He is still currently medically stable for discharge and still waiting for a bed.  Discharge Diagnoses:   Acute on chronic lacunar infarct in the dorsal right lentiform/posterior limb of the right Internal capsule: Ischemic stroke versus cocaine induced -Presented with recurrent falls and ongoing left-sided weakness for months -Neurology evaluation and follow-up  appreciated: Recommend aspirin and Plavix for 3 months followed by Plavix 75 mg daily along with Lipitor 80 mg daily -PT recommended SNF.  Tolerating diet as per SLP recommendations. -LDL 171.  Hemoglobin A1c 14.2 -Carotid ultrasound showed bilateral 1 to 39% ICA stenosis.  2D echo showed EF of 55 to 60% with grade 2 diastolic dysfunction -Neurology has signed off.  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   Outpatient follow-up with repeat CTA in a year as per neurology  Bipolar disorder with suicidal ideation/attempt -Psychiatry recommended inpatient psychiatric hospitalization.  Patient will be discharged to inpatient psychiatric facility once bed is available.  He is currently medically stable for discharge.  Currently on Abilify and trazodone as per psychiatry recommendations.  Diabetes mellitus type 2 uncontrolled with hyperglycemia -A1c 14.2.  Continue Levemir along with Humalog.  Carb modified diet.  Resume Metformin.  Hold glipizide.  Outpatient follow-up with PCP  Recurrent falls -PT recommends SNF.   He will probably need to go to SNF from inpatient psychiatric hospital.  no acute injuries except for small abrasion along the knee  Hyperlipidemia -Statin plan as above  Polysubstance abuse -Longstanding history of cocaine/alcohol abuse.  Urine drug screen was positive for cocaine.  Social worker consult.    Discharge Instructions  Discharge Instructions    Ambulatory referral to Neurology   Complete by: As directed    An appointment is requested in approximately: 2-3 weeks for follow-up of stroke   Diet - low sodium heart healthy   Complete by: As directed    Diet Carb Modified    Complete by: As directed  Increase activity slowly   Complete by: As directed      Allergies as of 04/10/2020      Reactions   Wellbutrin [bupropion] Hives, Swelling      Medication List    STOP taking these medications   FLUoxetine 40 MG capsule Commonly known as: PROZAC   glipiZIDE 10 MG tablet Commonly known as: GLUCOTROL   hydrOXYzine 25 MG tablet Commonly known as: ATARAX/VISTARIL   QUEtiapine 200 MG tablet Commonly known as: SEROQUEL   zolpidem 5 MG tablet Commonly known as: AMBIEN     TAKE these medications   ARIPiprazole 5 MG tablet Commonly known as: ABILIFY Take 1 tablet (5 mg total) by mouth daily. What changed:   medication strength  how much to take   aspirin 325 MG tablet Take 1 tablet (325 mg total) by mouth daily.   atorvastatin 80 MG tablet Commonly known as: LIPITOR Take 1 tablet (80 mg total) by mouth daily.   clopidogrel 75 MG tablet Commonly known as: PLAVIX Take 1 tablet (75 mg total) by mouth daily.   folic acid 1 MG tablet Commonly known as: FOLVITE Take 1 tablet (1 mg total) by mouth daily.   HumaLOG KwikPen 100 UNIT/ML KwikPen Generic drug: insulin lispro Inject 0-0.1 mLs (0-10 Units total) into the skin 3 (three) times daily with meals.   insulin detemir 100 UNIT/ML injection Commonly known as: LEVEMIR Inject 0.45 mLs (45 Units total) into the skin at bedtime. What changed:   how much to take  additional instructions   metFORMIN 1000 MG tablet Commonly known as: GLUCOPHAGE Take 1 tablet (1,000 mg total) by mouth 2 (two) times daily with a meal.   multivitamin with minerals Tabs tablet Take 1 tablet by mouth daily.   thiamine 100 MG tablet Take 1 tablet (100 mg total) by mouth daily.   traZODone 100 MG tablet Commonly known as: DESYREL Take 1 tablet (100 mg total) by mouth at bedtime.       Allergies  Allergen Reactions  . Wellbutrin [Bupropion] Hives and Swelling     Consultations:  Neurology/psychiatry   Procedures/Studies: CT ANGIO HEAD W OR WO CONTRAST  Result Date: 04/06/2020 CLINICAL DATA:  Stroke/TIA, assess extracranial arteries, right internal capsule stroke. EXAM: CT ANGIOGRAPHY HEAD AND NECK TECHNIQUE: Multidetector CT imaging of the head and neck was performed using the standard protocol during bolus administration of intravenous contrast. Multiplanar CT image reconstructions and MIPs were obtained to evaluate the vascular anatomy. Carotid stenosis measurements (when applicable) are obtained utilizing NASCET criteria, using the distal internal carotid diameter as the denominator. CONTRAST:  OMNIPAQUE IOHEXOL 350 MG/ML SOLN COMPARISON:  Brain MRI performed earlier the same day 04/05/2020, prior head CT examinations 04/05/2020 and earlier. FINDINGS: CT HEAD FINDINGS Brain: A punctate acute infarct within the right basal ganglia/posterior limb of right internal capsule was better appreciated on the brain MRI performed earlier the same day. Advanced ill-defined hypoattenuation within the cerebral white matter consistent with chronic small vessel ischemic disease. Redemonstrated chronic lacunar infarcts within the bilateral corona radiata/basal ganglia and right thalamus. Also redemonstrated is a small chronic infarct within the left cerebellar hemisphere. There is no acute intracranial hemorrhage. No demarcated cortical infarct is identified. No extra-axial fluid collection. No evidence of intracranial mass. No midline shift. Vascular: Reported below. Skull: Normal. Negative for fracture or focal lesion. Sinuses: Minimal ethmoid and maxillary sinus mucosal thickening. No significant mastoid effusion. Orbits: No acute finding. Review of the MIP images confirms the above findings  CTA NECK FINDINGS Aortic arch: Common origin of the innominate and left common carotid arteries. Atherosclerotic plaque within the visualized aortic arch. No hemodynamically  significant innominate or proximal subclavian artery stenosis. Right carotid system: CCA and ICA patent within the neck without significant stenosis (50% or greater). Minimal mixed plaque within the prox ICA. Left carotid system: CCA and ICA patent within the neck without significant stenosis (50% or greater). Mild mixed plaque within the carotid bifurcation and proximal ICA. This includes a small focus of ulcerated plaque along the posterior aspect of the carotid bulb (series 14, image 131) Vertebral arteries: Patent bilaterally without significant stenosis. The left vertebral artery is slightly dominant. Skeleton: No acute bony abnormality or aggressive osseous lesion. Cervical spondylosis with multilevel disc space narrowing, posterior disc osteophytes, uncovertebral and facet hypertrophy. Additionally, there are multilevel prominent ventral osteophytes most notably at the C5-C6 and C6-C7 levels. Other neck: No neck mass or cervical lymphadenopathy. Upper chest: No consolidation within the imaged lung apices. Review of the MIP images confirms the above findings CTA HEAD FINDINGS Anterior circulation: The intracranial internal carotid arteries are patent. Minimal calcified plaque within the intracranial left ICA without stenosis. The M1 middle cerebral arteries are patent without significant stenosis. Mild-to-moderate segmental narrowing of the proximal to mid M1 left MCA (series 15, image 50). No M2 proximal branch occlusion is identified. There are high-grade focal stenoses within multiple distal M2 left MCA branch vessels (for instance as seen on series 17, image 28). The anterior cerebral arteries are patent. Moderate/severe segmental stenosis within the A2 right anterior cerebral artery (series 17, image 22). 2 mm inferiorly projecting vascular protrusion arising from the paraclinoid right ICA, which may reflect an infundibulum or small aneurysm (series 14, image 87). Posterior circulation: The intracranial  vertebral arteries are patent. The basilar artery is patent. Fetal origin left posterior cerebral artery. The posterior cerebral arteries are patent bilaterally. There is a moderate focal stenosis within the P1 right PCA. Severe focal stenosis within the proximal P2 right PCA. Moderate/severe focal stenoses within the left posterior communicating artery and P2 left PCA (series 15, image 50). The right posterior communicating artery is hypoplastic or absent. Venous sinuses: Within limitations of contrast timing, no convincing thrombus. Anatomic variants: As described Review of the MIP images confirms the above findings IMPRESSION: CT head: 1. A punctate acute infarct within the right basal ganglia/posterior limb of right internal capsule was better appreciated on the brain MRI performed earlier the same day. 2. 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 3. Redemonstrated small chronic infarct within the left cerebellum. CTA neck: The common carotid, internal carotid and vertebral arteries are patent within the neck without hemodynamically significant stenosis. Mild calcified plaque within the carotid bifurcations/proximal ICAs bilaterally. This includes a focus of ulcerated plaque within the posterior aspect of the left carotid bulb. CTA head: 1. No intracranial large vessel occlusion. 2. Intracranial atherosclerotic disease with multifocal stenoses, most notably as follows. 3. Mild-to-moderate segmental narrowing of the proximal to mid M1 left MCA. 4. High-grade focal stenoses within multiple distal M2 left MCA branch vessels. 5. Moderate/severe segmental stenosis within the A2 right anterior cerebral artery. 6. Moderate/severe focal stenosis within the P1 right PCA 7. Severe focal stenosis within the P2 right PCA. 8. The left PCA is fetal in origin. Moderate/severe focal  stenoses within the left posterior communicating artery and P2 left PCA. 9. 2 mm  vascular protrusion arising from the paraclinoid right ICA which may reflect an infundibulum or small aneurysm. Electronically Signed   By: Jackey Loge DO   On: 04/06/2020 14:34   DG Chest 2 View  Result Date: 04/04/2020 CLINICAL DATA:  Hyperglycemic, suicidal thoughts X 3 days. History of hypertension, diabetes, schizoaffective and bipolar disorder, hepatitis. EXAM: CHEST - 2 VIEW COMPARISON:  Chest x-ray 02/28/2016. chest x-ray 02/03/2013 FINDINGS: The heart size and mediastinal contours are within normal limits. Both lungs are clear. No acute osseous finding. Multilevel degenerative changes of the spine with chronic mild anterior wedge shaped compression deformity of a midthoracic body IMPRESSION: No active cardiopulmonary disease. Electronically Signed   By: Tish Frederickson M.D.   On: 04/04/2020 10:48   CT Head Wo Contrast  Result Date: 04/05/2020 CLINICAL DATA:  Trauma EXAM: CT HEAD WITHOUT CONTRAST CT CERVICAL SPINE WITHOUT CONTRAST TECHNIQUE: Multidetector CT imaging of the head and cervical spine was performed following the standard protocol without intravenous contrast. Multiplanar CT image reconstructions of the cervical spine were also generated. COMPARISON:  01/18/2015 head CT. FINDINGS: CT HEAD FINDINGS Brain: Age indeterminate focal hypodensities involving the left basal ganglia (3:14) and superior left cerebellum (7:46, new since prior exam. Additional scattered and confluent hypodense foci involving the periventricular and subcortical white matter are nonspecific however may reflect chronic microvascular ischemic changes. No mass lesion. No midline shift, ventriculomegaly or extra-axial fluid collection. Mild cerebral atrophy with ex vacuo dilatation. Vascular: No hyperdense vessel or unexpected calcification. Skull: Negative for fracture or focal lesion. Sinuses/Orbits: Normal orbits. Clear paranasal sinuses. No  mastoid effusion. Other: None. CT CERVICAL SPINE FINDINGS Alignment: Straightening of lordosis.  No listhesis. Skull base and vertebrae: No acute fracture. No primary bone lesion or focal pathologic process. Soft tissues and spinal canal: No prevertebral fluid or swelling. No visible canal hematoma. Disc levels: Multilevel spondylosis most prominent at the C5-7 levels. Patent bony spinal canal. Multilevel mild-to-moderate bony neural foraminal narrowing secondary to uncovertebral and facet hypertrophy. Upper chest: Clear lung apices. Other: None. IMPRESSION: Focal hypodensities involving the left basal ganglia and superior left cerebellum are age indeterminate. Consider MRI brain to exclude subacute insults. Mild cerebral atrophy and chronic microvascular ischemic changes. No acute fracture or traumatic listhesis.  Multilevel spondylosis. These results were called by telephone at the time of interpretation on 04/05/2020 at 10:51 am to provider [facility] , who verbally acknowledged these results. Electronically Signed   By: Stana Bunting M.D.   On: 04/05/2020 11:02   CT ANGIO NECK W OR WO CONTRAST  Result Date: 04/06/2020 CLINICAL DATA:  Stroke/TIA, assess extracranial arteries, right internal capsule stroke. EXAM: CT ANGIOGRAPHY HEAD AND NECK TECHNIQUE: Multidetector CT imaging of the head and neck was performed using the standard protocol during bolus administration of intravenous contrast. Multiplanar CT image reconstructions and MIPs were obtained to evaluate the vascular anatomy. Carotid stenosis measurements (when applicable) are obtained utilizing NASCET criteria, using the distal internal carotid diameter as the denominator. CONTRAST:  OMNIPAQUE IOHEXOL 350 MG/ML SOLN COMPARISON:  Brain MRI performed earlier the same day 04/05/2020, prior head CT examinations 04/05/2020 and earlier. FINDINGS: CT HEAD FINDINGS Brain: A punctate acute infarct within the right basal ganglia/posterior limb of  right internal capsule was better appreciated on the brain MRI performed earlier the same day. Advanced ill-defined hypoattenuation within the cerebral white matter consistent with chronic small vessel ischemic disease. Redemonstrated chronic lacunar infarcts within the bilateral corona radiata/basal ganglia and right thalamus. Also redemonstrated is a  small chronic infarct within the left cerebellar hemisphere. There is no acute intracranial hemorrhage. No demarcated cortical infarct is identified. No extra-axial fluid collection. No evidence of intracranial mass. No midline shift. Vascular: Reported below. Skull: Normal. Negative for fracture or focal lesion. Sinuses: Minimal ethmoid and maxillary sinus mucosal thickening. No significant mastoid effusion. Orbits: No acute finding. Review of the MIP images confirms the above findings CTA NECK FINDINGS Aortic arch: Common origin of the innominate and left common carotid arteries. Atherosclerotic plaque within the visualized aortic arch. No hemodynamically significant innominate or proximal subclavian artery stenosis. Right carotid system: CCA and ICA patent within the neck without significant stenosis (50% or greater). Minimal mixed plaque within the prox ICA. Left carotid system: CCA and ICA patent within the neck without significant stenosis (50% or greater). Mild mixed plaque within the carotid bifurcation and proximal ICA. This includes a small focus of ulcerated plaque along the posterior aspect of the carotid bulb (series 14, image 131) Vertebral arteries: Patent bilaterally without significant stenosis. The left vertebral artery is slightly dominant. Skeleton: No acute bony abnormality or aggressive osseous lesion. Cervical spondylosis with multilevel disc space narrowing, posterior disc osteophytes, uncovertebral and facet hypertrophy. Additionally, there are multilevel prominent ventral osteophytes most notably at the C5-C6 and C6-C7 levels. Other neck: No  neck mass or cervical lymphadenopathy. Upper chest: No consolidation within the imaged lung apices. Review of the MIP images confirms the above findings CTA HEAD FINDINGS Anterior circulation: The intracranial internal carotid arteries are patent. Minimal calcified plaque within the intracranial left ICA without stenosis. The M1 middle cerebral arteries are patent without significant stenosis. Mild-to-moderate segmental narrowing of the proximal to mid M1 left MCA (series 15, image 50). No M2 proximal branch occlusion is identified. There are high-grade focal stenoses within multiple distal M2 left MCA branch vessels (for instance as seen on series 17, image 28). The anterior cerebral arteries are patent. Moderate/severe segmental stenosis within the A2 right anterior cerebral artery (series 17, image 22). 2 mm inferiorly projecting vascular protrusion arising from the paraclinoid right ICA, which may reflect an infundibulum or small aneurysm (series 14, image 87). Posterior circulation: The intracranial vertebral arteries are patent. The basilar artery is patent. Fetal origin left posterior cerebral artery. The posterior cerebral arteries are patent bilaterally. There is a moderate focal stenosis within the P1 right PCA. Severe focal stenosis within the proximal P2 right PCA. Moderate/severe focal stenoses within the left posterior communicating artery and P2 left PCA (series 15, image 50). The right posterior communicating artery is hypoplastic or absent. Venous sinuses: Within limitations of contrast timing, no convincing thrombus. Anatomic variants: As described Review of the MIP images confirms the above findings IMPRESSION: CT head: 1. A punctate acute infarct within the right basal ganglia/posterior limb of right internal capsule was better appreciated on the brain MRI performed earlier the same day. 2. Unchanged advanced cerebral white matter chronic small vessel ischemic disease with multiple chronic  lacunar infarcts within the bilateral corona radiata/basal ganglia and right thalamus. 3. Redemonstrated small chronic infarct within the left cerebellum. CTA neck: The common carotid, internal carotid and vertebral arteries are patent within the neck without hemodynamically significant stenosis. Mild calcified plaque within the carotid bifurcations/proximal ICAs bilaterally. This includes a focus of ulcerated plaque within the posterior aspect of the left carotid bulb. CTA head: 1. No intracranial large vessel occlusion. 2. Intracranial atherosclerotic disease with multifocal stenoses, most notably as follows. 3. Mild-to-moderate segmental narrowing of the proximal to mid M1  left MCA. 4. High-grade focal stenoses within multiple distal M2 left MCA branch vessels. 5. Moderate/severe segmental stenosis within the A2 right anterior cerebral artery. 6. Moderate/severe focal stenosis within the P1 right PCA 7. Severe focal stenosis within the P2 right PCA. 8. The left PCA is fetal in origin. Moderate/severe focal stenoses within the left posterior communicating artery and P2 left PCA. 9. 2 mm vascular protrusion arising from the paraclinoid right ICA which may reflect an infundibulum or small aneurysm. Electronically Signed   By: Jackey Loge DO   On: 04/06/2020 14:34   CT Cervical Spine Wo Contrast  Result Date: 04/05/2020 CLINICAL DATA:  Trauma EXAM: CT HEAD WITHOUT CONTRAST CT CERVICAL SPINE WITHOUT CONTRAST TECHNIQUE: Multidetector CT imaging of the head and cervical spine was performed following the standard protocol without intravenous contrast. Multiplanar CT image reconstructions of the cervical spine were also generated. COMPARISON:  01/18/2015 head CT. FINDINGS: CT HEAD FINDINGS Brain: Age indeterminate focal hypodensities involving the left basal ganglia (3:14) and superior left cerebellum (7:46, new since prior exam. Additional scattered and confluent hypodense foci involving the periventricular and  subcortical white matter are nonspecific however may reflect chronic microvascular ischemic changes. No mass lesion. No midline shift, ventriculomegaly or extra-axial fluid collection. Mild cerebral atrophy with ex vacuo dilatation. Vascular: No hyperdense vessel or unexpected calcification. Skull: Negative for fracture or focal lesion. Sinuses/Orbits: Normal orbits. Clear paranasal sinuses. No mastoid effusion. Other: None. CT CERVICAL SPINE FINDINGS Alignment: Straightening of lordosis.  No listhesis. Skull base and vertebrae: No acute fracture. No primary bone lesion or focal pathologic process. Soft tissues and spinal canal: No prevertebral fluid or swelling. No visible canal hematoma. Disc levels: Multilevel spondylosis most prominent at the C5-7 levels. Patent bony spinal canal. Multilevel mild-to-moderate bony neural foraminal narrowing secondary to uncovertebral and facet hypertrophy. Upper chest: Clear lung apices. Other: None. IMPRESSION: Focal hypodensities involving the left basal ganglia and superior left cerebellum are age indeterminate. Consider MRI brain to exclude subacute insults. Mild cerebral atrophy and chronic microvascular ischemic changes. No acute fracture or traumatic listhesis.  Multilevel spondylosis. These results were called by telephone at the time of interpretation on 04/05/2020 at 10:51 am to provider [facility] , who verbally acknowledged these results. Electronically Signed   By: Stana Bunting M.D.   On: 04/05/2020 11:02   MR Brain Wo Contrast (neuro protocol)  Result Date: 04/05/2020 CLINICAL DATA:  59 year old male status post trauma, neurologic deficit with left side weakness. Age indeterminate ischemia in the left basal ganglia and left cerebellum on plain head CT earlier today. EXAM: MRI HEAD WITHOUT CONTRAST TECHNIQUE: Multiplanar, multiecho pulse sequences of the brain and surrounding structures were obtained without intravenous contrast. COMPARISON:  Head CT 1029  hours today. FINDINGS: Brain: Questionable punctate focus of restricted diffusion at the dorsal right lentiform near the posterior limb of the right internal capsule, occurring in an area of chronic small vessel ischemia and encephalomalacia (series 5, image 80 and series 8, image 15). No other restricted diffusion. 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 Moderate patchy T2 hyperintensity in the right paracentral pons also most resembles chronic small-vessel ischemia. No cortical encephalomalacia or definite chronic blood products. No midline shift, mass effect, evidence of mass lesion, ventriculomegaly, extra-axial collection or acute intracranial hemorrhage. Cervicomedullary junction and  pituitary are within normal limits. Vascular: Major intracranial vascular flow voids are preserved. Skull and upper cervical spine: Negative for age visible cervical spine, bone marrow signal. Sinuses/Orbits: Negative orbits. Paranasal sinuses and mastoids are stable and well pneumatized. Other: There is a small volume of fluid trapped in the left petrous apex (series 10, image 12) with facilitated diffusion and no complicating features. Otherwise grossly normal visible internal auditory structures. Scalp and face appear negative. IMPRESSION: 1. Suspect punctate acute on chronic lacunar infarct at the dorsal right lentiform / posterior limb of the right internal capsule. No associated hemorrhage or mass effect. 2. Underlying advanced chronic small vessel disease, with chronic ischemia in the left basal ganglia and the left cerebellum SCA territory corresponding to head CT findings earlier today. Electronically Signed   By: Odessa Fleming M.D.   On: 04/05/2020 13:44   DG Knee Complete 4 Views Left  Result Date:  04/05/2020 CLINICAL DATA:  Left knee pain after fall. EXAM: LEFT KNEE - COMPLETE 4+ VIEW COMPARISON:  None. FINDINGS: No evidence of fracture, dislocation, or joint effusion. No evidence of arthropathy or other focal bone abnormality. Soft tissues are unremarkable. IMPRESSION: Negative. Electronically Signed   By: Lupita Raider M.D.   On: 04/05/2020 11:03   ECHOCARDIOGRAM COMPLETE  Result Date: 04/06/2020    ECHOCARDIOGRAM REPORT   Patient Name:   ASWAD WANDREY Date of Exam: 04/06/2020 Medical Rec #:  [id number]      Height:       69.0 in Accession #:    [id number]     Weight:       175.0 lb Date of Birth:  August 05, 1960       BSA:          1.952 m Patient Age:    59 years       BP:           125/70 mmHg Patient Gender: M              HR:           68 bpm. Exam Location:  Inpatient Procedure: 2D Echo, Cardiac Doppler, Color Doppler and Strain Analysis Indications:    Stroke  History:        Patient has prior history of Echocardiogram examinations, most                 recent 02/29/2016. Stroke, Signs/Symptoms:Chest Pain; Risk                 Factors:Diabetes and Dyslipidemia. Cocaine use.  Sonographer:    Sheralyn Boatman RDCS Referring Phys: [id number] RONDELL A SMITH  Sonographer Comments: Technically difficult study due to poor echo windows. Global longitudinal strain was attempted. IMPRESSIONS  1. Left ventricular ejection fraction, by estimation, is 55 to 60%. The left ventricle has normal function. The left ventricle has no regional wall motion abnormalities. There is moderate left ventricular hypertrophy. Left ventricular diastolic parameters are consistent with Grade II diastolic dysfunction (pseudonormalization). The average left ventricular global longitudinal strain is -18.1 %.  2. Right ventricular systolic function is normal. The right ventricular size is normal.  3. The mitral valve is grossly normal. Trivial mitral valve regurgitation.  4. The aortic valve is grossly normal. Aortic valve regurgitation is not  visualized. No aortic stenosis is present. FINDINGS  Left Ventricle: Left ventricular ejection fraction, by estimation, is 55 to 60%. The left ventricle has normal function. The left ventricle has no regional wall motion abnormalities. The average left  ventricular global longitudinal strain is -18.1 %. The left ventricular internal cavity size was normal in size. There is moderate left ventricular hypertrophy. Left ventricular diastolic parameters are consistent with Grade II diastolic dysfunction (pseudonormalization). Right Ventricle: The right ventricular size is normal. No increase in right ventricular wall thickness. Right ventricular systolic function is normal. Left Atrium: Left atrial size was normal in size. Right Atrium: Right atrial size was normal in size. Pericardium: There is no evidence of pericardial effusion. Mitral Valve: The mitral valve is grossly normal. Trivial mitral valve regurgitation. Tricuspid Valve: The tricuspid valve is grossly normal. Tricuspid valve regurgitation is trivial. Aortic Valve: The aortic valve is grossly normal. Aortic valve regurgitation is not visualized. No aortic stenosis is present. Pulmonic Valve: The pulmonic valve was normal in structure. Pulmonic valve regurgitation is not visualized. Aorta: The aortic root and ascending aorta are structurally normal, with no evidence of dilitation. IAS/Shunts: The atrial septum is grossly normal.  LEFT VENTRICLE PLAX 2D LVIDd:         3.60 cm     Diastology LVIDs:         2.50 cm     LV e' medial:    7.07 cm/s LV PW:         1.70 cm     LV E/e' medial:  9.5 LV IVS:        1.30 cm     LV e' lateral:   10.60 cm/s LVOT diam:     1.90 cm     LV E/e' lateral: 6.3 LV SV:         45 LV SV Index:   23          2D Longitudinal Strain LVOT Area:     2.84 cm    2D Strain GLS Avg:     -18.1 %  LV Volumes (MOD) LV vol d, MOD A2C: 47.7 ml LV vol d, MOD A4C: 71.2 ml LV vol s, MOD A2C: 23.2 ml LV vol s, MOD A4C: 29.2 ml LV SV MOD A2C:     24.5  ml LV SV MOD A4C:     71.2 ml LV SV MOD BP:      32.5 ml RIGHT VENTRICLE             IVC RV S prime:     10.90 cm/s  IVC diam: 1.80 cm TAPSE (M-mode): 1.9 cm LEFT ATRIUM             Index      RIGHT ATRIUM           Index LA diam:        4.00 cm 2.05 cm/m RA Area:     11.70 cm LA Vol (A2C):   13.0 ml 6.66 ml/m RA Volume:   25.20 ml  12.91 ml/m LA Vol (A4C):   18.2 ml 9.32 ml/m LA Biplane Vol: 16.8 ml 8.61 ml/m  AORTIC VALVE LVOT Vmax:   89.20 cm/s LVOT Vmean:  63.900 cm/s LVOT VTI:    0.159 m  AORTA Ao Root diam: 3.30 cm Ao Asc diam:  3.20 cm MITRAL VALVE MV Area (PHT): 2.73 cm    SHUNTS MV Decel Time: 278 msec    Systemic VTI:  0.16 m MV E velocity: 67.20 cm/s  Systemic Diam: 1.90 cm MV A velocity: 50.00 cm/s MV E/A ratio:  1.34 Kristeen Miss MD Electronically signed by Kristeen Miss MD Signature Date/Time: 04/06/2020/12:47:27 PM    Final    VAS  US CAROTID  Result Date: 04/06/2020 Carotid Arterial Duplex Study Indications:       CVA. Risk Factors:      Prior CVA. Comparison Study:  No prior studies. Performing Technologist: Chanda Busing RVT  Examination Guidelines: A complete evaluation includes B-mode imaging, spectral Doppler, color Doppler, and power Doppler as needed of all accessible portions of each vessel. Bilateral testing is considered an integral part of a complete examination. Limited examinations for reoccurring indications may be performed as noted.  Right Carotid Findings: +----------+--------+--------+--------+-----------------------+--------+           PSV cm/sEDV cm/sStenosisPlaque Description     Comments +----------+--------+--------+--------+-----------------------+--------+ CCA Prox  85      17              smooth and heterogenous         +----------+--------+--------+--------+-----------------------+--------+ CCA Distal42      14              smooth and heterogenous         +----------+--------+--------+--------+-----------------------+--------+ ICA Prox  38       16              smooth and heterogenous         +----------+--------+--------+--------+-----------------------+--------+ ICA Distal58      21                                              +----------+--------+--------+--------+-----------------------+--------+ ECA       87      16                                              +----------+--------+--------+--------+-----------------------+--------+ +----------+--------+-------+--------+-------------------+           PSV cm/sEDV cmsDescribeArm Pressure (mmHG) +----------+--------+-------+--------+-------------------+ [id number]                                        +----------+--------+-------+--------+-------------------+ +---------+--------+--+--------+--+---------+ VertebralPSV cm/s32EDV cm/s13Antegrade +---------+--------+--+--------+--+---------+  Left Carotid Findings: +----------+--------+--------+--------+-----------------------+--------+           PSV cm/sEDV cm/sStenosisPlaque Description     Comments +----------+--------+--------+--------+-----------------------+--------+ CCA Prox  75      15              smooth and heterogenous         +----------+--------+--------+--------+-----------------------+--------+ CCA Distal31      8               smooth and heterogenous         +----------+--------+--------+--------+-----------------------+--------+ ICA Prox  37      14              smooth and heterogenous         +----------+--------+--------+--------+-----------------------+--------+ ICA Distal84      36                                              +----------+--------+--------+--------+-----------------------+--------+ ECA       77      18                                              +----------+--------+--------+--------+-----------------------+--------+ +----------+--------+--------+--------+-------------------+  PSV cm/sEDV cm/sDescribeArm Pressure (mmHG)  +----------+--------+--------+--------+-------------------+ [id number]                                          +----------+--------+--------+--------+-------------------+ +---------+--------+--+--------+--+---------+ VertebralPSV cm/s46EDV cm/s14Antegrade +---------+--------+--+--------+--+---------+   Summary: Right Carotid: Velocities in the right ICA are consistent with a 1-39% stenosis. Left Carotid: Velocities in the left ICA are consistent with a 1-39% stenosis. Vertebrals: Bilateral vertebral arteries demonstrate antegrade flow. *See table(s) above for measurements and observations.  Electronically signed by Sherald Hess MD on 04/06/2020 at 4:37:52 PM.    Final       Subjective: Patient seen and examined at bedside.  Very poor historian.  Sitter present at bedside.  Does not participate in conversation much.  No overnight fever, agitation or vomiting reported.  Discharge Exam: Vitals:   04/10/20 0103 04/10/20 0647  BP: (!) 142/78 125/70  Pulse: 66 67  Resp: 18 18  Temp: 98.4 F (36.9 C) 98 F (36.7 C)  SpO2: 99% 100%    General: No acute distress.  Chronically ill looking.  Does not participate in conversation much.   Cardiovascular: S1-S2 heard, rate controlled Respiratory: bilateral decreased breath sounds at bases, no wheezing Abdominal: Soft, NT, ND, bowel sounds are heard Extremities: No clubbing, cyanosis or edema   The results of significant diagnostics from this hospitalization (including imaging, microbiology, ancillary and laboratory) are listed below for reference.     Microbiology: Recent Results (from the past 240 hour(s))  SARS Coronavirus 2 by RT PCR (hospital order, performed in [facility] hospital lab) Nasopharyngeal Nasopharyngeal Swab     Status: None   Collection Time: 04/04/20 10:12 AM   Specimen: Nasopharyngeal Swab  Result Value Ref Range Status   SARS Coronavirus 2 NEGATIVE NEGATIVE Final    Comment: (NOTE) SARS-CoV-2 target  nucleic acids are NOT DETECTED.  The SARS-CoV-2 RNA is generally detectable in upper and lower respiratory specimens during the acute phase of infection. The lowest concentration of SARS-CoV-2 viral copies this assay can detect is 250 copies / mL. A negative result does not preclude SARS-CoV-2 infection and should not be used as the sole basis for treatment or other patient management decisions.  A negative result may occur with improper specimen collection / handling, submission of specimen other than nasopharyngeal swab, presence of viral mutation(s) within the areas targeted by this assay, and inadequate number of viral copies (<250 copies / mL). A negative result must be combined with clinical observations, patient history, and epidemiological information.  Fact Sheet for Patients:   BoilerBrush.com.cy  Fact Sheet for Healthcare Providers: https://pope.com/  This test is not yet approved or  cleared by the [place] FDA and has been authorized for detection and/or diagnosis of SARS-CoV-2 by FDA under an Emergency Use Authorization (EUA).  This EUA will remain in effect (meaning this test can be used) for the duration of the COVID-19 declaration under Section 564(b)(1) of the Act, 21 U.S.C. section 360bbb-3(b)(1), unless the authorization is terminated or revoked sooner.  Performed at [facility], [address] [place], [place] [address]   SARS Coronavirus 2 by RT PCR (hospital order, performed in [facility] hospital lab) Nasopharyngeal Nasopharyngeal Swab     Status: None   Collection Time: 04/06/20  5:54 PM   Specimen: Nasopharyngeal Swab  Result Value Ref Range Status   SARS Coronavirus 2 NEGATIVE NEGATIVE Final  Comment: (NOTE) SARS-CoV-2 target nucleic acids are NOT DETECTED.  The SARS-CoV-2 RNA is generally detectable in upper and lower respiratory specimens during the acute phase of infection. The  lowest concentration of SARS-CoV-2 viral copies this assay can detect is 250 copies / mL. A negative result does not preclude SARS-CoV-2 infection and should not be used as the sole basis for treatment or other patient management decisions.  A negative result may occur with improper specimen collection / handling, submission of specimen other than nasopharyngeal swab, presence of viral mutation(s) within the areas targeted by this assay, and inadequate number of viral copies (<250 copies / mL). A negative result must be combined with clinical observations, patient history, and epidemiological information.  Fact Sheet for Patients:   BoilerBrush.com.cy  Fact Sheet for Healthcare Providers: https://pope.com/  This test is not yet approved or  cleared by the [place] FDA and has been authorized for detection and/or diagnosis of SARS-CoV-2 by FDA under an Emergency Use Authorization (EUA).  This EUA will remain in effect (meaning this test can be used) for the duration of the COVID-19 declaration under Section 564(b)(1) of the Act, 21 U.S.C. section 360bbb-3(b)(1), unless the authorization is terminated or revoked sooner.  Performed at [facility], [address] [place], [place] [address]      Labs: BNP (last 3 results) No results for input(s): BNP in the last 8760 hours. Basic Metabolic Panel: Recent Labs  Lab 04/04/20 0721 04/05/20 1055 04/06/20 0403  NA 136 135 137  K 4.5 4.2 3.3*  CL 101 99 105  CO2 20* 23 24  GLUCOSE 397* 170* 116*  BUN 22* 25* 20  CREATININE 1.27* 1.07 0.91  CALCIUM 10.2 9.5 8.6*  MG  --   --  1.4*  PHOS  --   --  3.7   Liver Function Tests: Recent Labs  Lab 04/04/20 1133 04/05/20 1055  AST 27 50*  ALT 17 24  ALKPHOS 57 65  BILITOT 1.4* 1.1  PROT 7.2 7.7  ALBUMIN 3.3* 3.8   No results for input(s): LIPASE, AMYLASE in the last 168 hours. No results for input(s):  AMMONIA in the last 168 hours. CBC: Recent Labs  Lab 04/04/20 0721 04/05/20 1055 04/06/20 0403  WBC 11.2* 8.2 5.5  NEUTROABS  --  5.5  --   HGB 12.8* 13.0 10.5*  HCT 38.6* 41.2 30.5*  MCV 86.2 90.4 85.2  PLT 245 204 201   Cardiac Enzymes: No results for input(s): CKTOTAL, CKMB, CKMBINDEX, TROPONINI in the last 168 hours. BNP: Invalid input(s): POCBNP CBG: Recent Labs  Lab 04/09/20 0727 04/09/20 1309 04/09/20 1651 04/09/20 2117 04/10/20 0105  GLUCAP 135* 260* 120* 306* 241*   D-Dimer No results for input(s): DDIMER in the last 72 hours. Hgb A1c No results for input(s): HGBA1C in the last 72 hours. Lipid Profile No results for input(s): CHOL, HDL, LDLCALC, TRIG, CHOLHDL, LDLDIRECT in the last 72 hours. Thyroid function studies No results for input(s): TSH, T4TOTAL, T3FREE, THYROIDAB in the last 72 hours.  Invalid input(s): FREET3 Anemia work up No results for input(s): VITAMINB12, FOLATE, FERRITIN, TIBC, IRON, RETICCTPCT in the last 72 hours. Urinalysis    Component Value Date/Time   COLORURINE YELLOW 04/05/2020 2200   APPEARANCEUR CLEAR 04/05/2020 2200   LABSPEC 1.020 04/05/2020 2200   PHURINE 5.0 04/05/2020 2200   GLUCOSEU >=500 (A) 04/05/2020 2200   HGBUR NEGATIVE 04/05/2020 2200   BILIRUBINUR NEGATIVE 04/05/2020 2200   KETONESUR NEGATIVE 04/05/2020 2200  PROTEINUR NEGATIVE 04/05/2020 2200   UROBILINOGEN 0.2 02/03/2013 1018   NITRITE NEGATIVE 04/05/2020 2200   LEUKOCYTESUR NEGATIVE 04/05/2020 2200   Sepsis Labs Invalid input(s): PROCALCITONIN,  WBC,  LACTICIDVEN Microbiology Recent Results (from the past 240 hour(s))  SARS Coronavirus 2 by RT PCR (hospital order, performed in [facility] hospital lab) Nasopharyngeal Nasopharyngeal Swab     Status: None   Collection Time: 04/04/20 10:12 AM   Specimen: Nasopharyngeal Swab  Result Value Ref Range Status   SARS Coronavirus 2 NEGATIVE NEGATIVE Final    Comment: (NOTE) SARS-CoV-2 target nucleic acids are  NOT DETECTED.  The SARS-CoV-2 RNA is generally detectable in upper and lower respiratory specimens during the acute phase of infection. The lowest concentration of SARS-CoV-2 viral copies this assay can detect is 250 copies / mL. A negative result does not preclude SARS-CoV-2 infection and should not be used as the sole basis for treatment or other patient management decisions.  A negative result may occur with improper specimen collection / handling, submission of specimen other than nasopharyngeal swab, presence of viral mutation(s) within the areas targeted by this assay, and inadequate number of viral copies (<250 copies / mL). A negative result must be combined with clinical observations, patient history, and epidemiological information.  Fact Sheet for Patients:   BoilerBrush.com.cy  Fact Sheet for Healthcare Providers: https://pope.com/  This test is not yet approved or  cleared by the [place] FDA and has been authorized for detection and/or diagnosis of SARS-CoV-2 by FDA under an Emergency Use Authorization (EUA).  This EUA will remain in effect (meaning this test can be used) for the duration of the COVID-19 declaration under Section 564(b)(1) of the Act, 21 U.S.C. section 360bbb-3(b)(1), unless the authorization is terminated or revoked sooner.  Performed at [facility], [address] [place], [place] [address]   SARS Coronavirus 2 by RT PCR (hospital order, performed in [facility] hospital lab) Nasopharyngeal Nasopharyngeal Swab     Status: None   Collection Time: 04/06/20  5:54 PM   Specimen: Nasopharyngeal Swab  Result Value Ref Range Status   SARS Coronavirus 2 NEGATIVE NEGATIVE Final    Comment: (NOTE) SARS-CoV-2 target nucleic acids are NOT DETECTED.  The SARS-CoV-2 RNA is generally detectable in upper and lower respiratory specimens during the acute phase of infection. The lowest concentration of  SARS-CoV-2 viral copies this assay can detect is 250 copies / mL. A negative result does not preclude SARS-CoV-2 infection and should not be used as the sole basis for treatment or other patient management decisions.  A negative result may occur with improper specimen collection / handling, submission of specimen other than nasopharyngeal swab, presence of viral mutation(s) within the areas targeted by this assay, and inadequate number of viral copies (<250 copies / mL). A negative result must be combined with clinical observations, patient history, and epidemiological information.  Fact Sheet for Patients:   BoilerBrush.com.cy  Fact Sheet for Healthcare Providers: https://pope.com/  This test is not yet approved or  cleared by the [place] FDA and has been authorized for detection and/or diagnosis of SARS-CoV-2 by FDA under an Emergency Use Authorization (EUA).  This EUA will remain in effect (meaning this test can be used) for the duration of the COVID-19 declaration under Section 564(b)(1) of the Act, 21 U.S.C. section 360bbb-3(b)(1), unless the authorization is terminated or revoked sooner.  Performed at [facility], [address] [place], [place] [address]  Time coordinating discharge: 35 minutes  SIGNED:   Glade Lloyd, MD  [facility] 04/10/2020, 7:52 AM

## 2020-04-10 NOTE — Progress Notes (Addendum)
Physical Therapy Treatment Patient Details Name: [name] MRN: [id number] DOB: 07/07/1961 Today's Date: 04/10/2020    History of Present Illness 59 year old male with history of diabetes mellitus type 2, hepatitis C, bipolar disorder presented with suicidal ideations and falls.  He reportedly attempted to place a gun to his head and pulled the trigger, but it was jammed.  Patient did admit to recently using cocaine and alcohol 6 days ago.  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    PT Comments    General Comments: AxO x 3 very pleasant and conversive.  Shared the loss of his mother and older sister which he feels he is still grieving. Stated he had to move in with his daughter "temporarily" and all she wants is his money.  Admits to a good relationship with daughter and his 2 grandkids but "they have their own lives" 2 teenagers.  Pt admits feeling "lost" and wants to "get my own place".General Gait Details: pt has used a straight cane since his stroke but also reports multiple falls.  Pt present with L LE weakness from past CVA and "bad knee".  Gait is slightly unsteady.   Requires Min/MinGuard Assist with cane.  Would benefit from a walker instead.  Pt not interested in using a walker given his age/appearance. A  Rollator Walker would be an ideal alternative for increased gait stability and safety to amb on his own. Rec pt amb with Rollator....not cane  Follow Up Recommendations  Per MD D/C Inpt Pych      Equipment Recommendations  Rollator [phone]   Recommendations for Other Services       Precautions / Restrictions Precautions Precautions: Fall Precaution Comments: s/p CVA mild/slight L hemi    Mobility  Bed Mobility Overal bed mobility: Modified Independent                Transfers Overall transfer level: Needs assistance Equipment used: Straight  cane Transfers: Sit to/from Stand;Stand Pivot Transfers Sit to Stand: Min assist Stand pivot transfers: Min assist       General transfer comment: pt has used a straight cane since his stroke but also reports multiple falls.  Pt not interested in using a walker given his age/appearance.  Pt would benefit from a Rollator Walker.  Ambulation/Gait Ambulation/Gait assistance: Min assist Gait Distance (Feet): 500 Feet Assistive device: Straight cane Gait Pattern/deviations: Step-through pattern;Decreased stance time - left;Decreased step length - right Gait velocity: decreased   General Gait Details: pt has used a straight cane since his stroke but also reports multiple falls.  Pt present with L LE weakness from past CVA and "bad knee".  Gait is slightly unsteady.  Pt not interested in using a walker given his age/appearance.  Pt would benefit from a Rollator Walker.   Stairs             Wheelchair Mobility    Modified Rankin (Stroke Patients Only)       Balance                                            Cognition Arousal/Alertness: Awake/alert Behavior During Therapy: WFL for tasks assessed/performed Overall Cognitive Status: No family/caregiver present to determine baseline cognitive functioning  General Comments: AxO x 3 very pleasant and conversive.  Shared the loss of his mother and older sister which he feels he is still grieving. Stated he had to move in with his daughter 'temporarily" and all she wants is his money.  Admits to a good relationship with daughter and his 2 grandkids but "they have their own lives" 2 teenagers.  Pt admits feeling "lost" and wants to "get my own place".      Exercises      General Comments        Pertinent Vitals/Pain Pain Assessment: Faces Faces Pain Scale: Hurts a little bit Pain Location: L knee "gives me problems" Pain Descriptors / Indicators: Grimacing Pain  Intervention(s): Monitored during session    Home Living                      Prior Function            PT Goals (current goals can now be found in the care plan section) Progress towards PT goals: Progressing toward goals    Frequency    Min 3X/week      PT Plan Current plan remains appropriate    Co-evaluation              AM-PAC PT "6 Clicks" Mobility   Outcome Measure  Help needed turning from your back to your side while in a flat bed without using bedrails?: None Help needed moving from lying on your back to sitting on the side of a flat bed without using bedrails?: None Help needed moving to and from a bed to a chair (including a wheelchair)?: A Little Help needed standing up from a chair using your arms (e.g., wheelchair or bedside chair)?: A Little Help needed to walk in hospital room?: A Little Help needed climbing 3-5 steps with a railing? : A Little 6 Click Score: 20    End of Session Equipment Utilized During Treatment: Gait belt Activity Tolerance: Patient tolerated treatment well Patient left: with call bell/phone within reach;with nursing/sitter in room;in bed Nurse Communication: Mobility status PT Visit Diagnosis: Repeated falls (R29.6);Other abnormalities of gait and mobility (R26.89)     Time: 8242-3536 PT Time Calculation (min) (ACUTE ONLY): 25 min  Charges:  $Gait Training: 8-22 mins $Therapeutic Activity: 8-22 mins                     Felecia Shelling  PTA Acute  Rehabilitation Services Pager      [phone] Office      [phone]

## 2020-04-11 ENCOUNTER — Inpatient Hospital Stay (HOSPITAL_COMMUNITY): Payer: Medicare Other

## 2020-04-11 LAB — GLUCOSE, CAPILLARY
Glucose-Capillary: 212 mg/dL — ABNORMAL HIGH (ref 70–99)
Glucose-Capillary: 144 mg/dL — ABNORMAL HIGH (ref 70–99)
Glucose-Capillary: 218 mg/dL — ABNORMAL HIGH (ref 70–99)
Glucose-Capillary: 292 mg/dL — ABNORMAL HIGH (ref 70–99)

## 2020-04-11 NOTE — TOC Progression Note (Signed)
Transition of Care [facility]) - Progression Note    Patient Details  Name: [name] MRN: [id number] Date of Birth: 10-Sep-1960  [facility]) CM/SW Contact  Geni Bers, RN Phone Number: 04/11/2020, 9:55 AM  Clinical Narrative:     Faxing pt out to other [facility]ilities. Waiting for response.   Expected Discharge Plan: Skilled Nursing Facility Barriers to Discharge: Continued Medical Work up  Expected Discharge Plan and Services Expected Discharge Plan: Skilled Nursing Facility In-house Referral: Clinical Social Work Discharge Planning Services: CM Consult Post Acute Care Choice: Skilled Nursing Facility Living arrangements for the past 2 months: Single Family Home Expected Discharge Date: 04/08/20                                     Social Determinants of Health (SDOH) Interventions    Readmission Risk Interventions No flowsheet data found.

## 2020-04-11 NOTE — Progress Notes (Signed)
PROGRESS NOTE  [name] XFG:[id number] DOB: 04/30/1961 DOA: 04/05/2020 PCP: System, Provider Not In  HPI/Recap of past 68 hours:  59 year old male with history of diabetes mellitus type 2, hepatitis C, bipolar disorder presented with suicidal ideations and falls. He reportedly attempted to place a gun to his head and pulled the trigger, but it was jammed. Patient did admit to recently using cocaine and alcohol 6 days ago. 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 Neurology/psychiatry were consulted.  Neurology recommended aspirin and Plavix for 3 months along with Lipitor with outpatient follow-up with neurology.  Psychiatry recommended inpatient psychiatric hospitalization.  Patient will be discharged to inpatient psychiatric facility once bed is available.  He is currently medically stable for discharge.  Patient was supposed to be discharged to inpatient psychiatric facility on 04/08/2020 pending bed availability. He is still currently medically stable for discharge and still waiting for a bed.  04/11/20:  Reports left great toe pain.  Denies prior hx of gout.  He is unsure whether or not he had any trauma to his left foot.  Will get an xray.  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  Tolerating diet as per SLP recommendations. -LDL 171. Hemoglobin A1c 14.2 -Carotid ultrasound showed bilateral 1 to 39% ICA stenosis. 2D echo showed EF of 55 to 60% with grade 2 diastolic dysfunction -Neurology has signed off. 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 Outpatient follow-up  Suicidal attempt/schizoaffective /bipolar disorder Recent suicidal attempt with a gun Continue 1-to-1 sitter Plan to dc to psych ward  Left great toe pain Reports left great toe pain.  Denies prior hx of gout.  He is unsure whether or not he had any trauma to his left foot.  Will get an xray. Analgesics PRN  Twomm vascular protrusion arising fromRICA/CCAbifurcation-infundibulum versus small aneurysm.Outpatient follow-up with repeat CTA in a year as per neurology  Bipolar disorder with suicidal ideation/attempt -Psychiatry recommended inpatient psychiatric hospitalization.  Patient will be discharged to inpatient psychiatric facility once bed is available.  He is currently medically stable for discharge.  Currently on Abilify and trazodone as per psychiatry recommendations.  Diabetes mellitus type 2 uncontrolled with hyperglycemia -A1c 14.2. Continue Levemir along with Humalog.  Carb modified diet.  Resume Metformin.  Hold glipizide.  Outpatient follow-up with PCP  Recurrent falls -PT recommends SNF.  He will probably need to go to SNF from inpatient psychiatric hospital.  no acute injuries except for small abrasion along the knee  Hyperlipidemia -Statin plan as above  Polysubstance abuse -Longstanding history of cocaine/alcohol abuse. Urine drug screen was positive for cocaine. Social worker consult.     Code Status: Full  Family Communication: None at  bedside  Consultants:  Psychiatry  Procedures:  None  Antimicrobials:  None  DVT prophylaxis:  None   Disposition Plan:   Status is: Inpatient    Dispo: The patient is from: Home              Anticipated d/c is to: Psych ward               Anticipated d/c date is: 04/12/20              Patient currently ongoing management of left great toe.        Objective: Vitals:   04/10/20 0647 04/10/20 1406 04/10/20 2110 04/11/20 0559  BP: 125/70 124/77 (!) 145/79 117/88  Pulse: 67 71 75 85  Resp: 18 16 17 15   Temp: 98 F (36.7 C) 98.1 F (36.7 C) 98.1 F (36.7 C) 98 F (36.7 C)  TempSrc: Oral Oral Oral Oral  SpO2: 100% 100% 100% 100%  Weight:      Height:        Intake/Output Summary (Last 24 hours) at 04/11/2020 1325 Last data filed at 04/10/2020 1800 Gross per 24 hour  Intake 600 ml  Output --  Net 600 ml   Filed Weights   04/05/20 0942 04/06/20 1817  Weight: 79.4 kg 75.3 kg    Exam:  . General: 59 y.o. year-old male well developed well nourished in no acute distress.  Alert and oriented x3. . Cardiovascular: Regular rate and rhythm with no rubs or gallops.  No thyromegaly or JVD noted.   Marland Kitchen. Respiratory: Clear to auscultation with no wheezes or rales. Good inspiratory effort. . Abdomen: Soft nontender nondistended with normal bowel sounds x4 quadrants. . Musculoskeletal: No lower extremity edema. 2/4 pulses in all 4 extremities. Left great toe tender with passive range of motion at base. Marland Kitchen. Psychiatry: Mood is appropriate for condition and setting   Data Reviewed: CBC: Recent Labs  Lab 04/05/20 1055 04/06/20 0403  WBC 8.2 5.5  NEUTROABS 5.5  --   HGB 13.0 10.5*  HCT 41.2 30.5*  MCV 90.4 85.2  PLT 204 201   Basic Metabolic Panel: Recent Labs  Lab 04/05/20 1055 04/06/20 0403  NA 135 137  K 4.2 3.3*  CL 99 105  CO2 23 24  GLUCOSE 170* 116*  BUN 25* 20  CREATININE 1.07 0.91  CALCIUM 9.5 8.6*  MG  --  1.4*  PHOS  --  3.7    GFR: Estimated Creatinine Clearance: 87.4 mL/min (by C-G formula based on SCr of 0.91 mg/dL). Liver Function Tests: Recent Labs  Lab 04/05/20 1055  AST 50*  ALT 24  ALKPHOS 65  BILITOT 1.1  PROT 7.7  ALBUMIN 3.8   No results for input(s): LIPASE, AMYLASE in the last 168 hours. No results for input(s): AMMONIA in the last 168 hours. Coagulation Profile: No results for input(s): INR, PROTIME in the last 168 hours. Cardiac Enzymes: No results for input(s): CKTOTAL, CKMB, CKMBINDEX, TROPONINI in the last 168 hours. BNP (last 3 results) No results for input(s): PROBNP in the last 8760 hours. HbA1C: No results for input(s): HGBA1C in the last 72 hours. CBG: Recent Labs  Lab 04/10/20 1202 04/10/20 1659 04/10/20 2113 04/11/20 0742 04/11/20 1200  GLUCAP 82 316* 227* 212* 144*   Lipid Profile: No results for input(s): CHOL, HDL, LDLCALC, TRIG, CHOLHDL, LDLDIRECT in the last 72 hours. Thyroid Function Tests: No results for input(s): TSH, T4TOTAL, FREET4, T3FREE, THYROIDAB in the last 72 hours. Anemia Panel: No results for  input(s): VITAMINB12, FOLATE, FERRITIN, TIBC, IRON, RETICCTPCT in the last 72 hours. Urine analysis:    Component Value Date/Time   COLORURINE YELLOW 04/05/2020 2200   APPEARANCEUR CLEAR 04/05/2020 2200   LABSPEC 1.020 04/05/2020 2200   PHURINE 5.0 04/05/2020 2200   GLUCOSEU >=500 (A) 04/05/2020 2200   HGBUR NEGATIVE 04/05/2020 2200   BILIRUBINUR NEGATIVE 04/05/2020 2200   KETONESUR NEGATIVE 04/05/2020 2200   PROTEINUR NEGATIVE 04/05/2020 2200   UROBILINOGEN 0.2 02/03/2013 1018   NITRITE NEGATIVE 04/05/2020 2200   LEUKOCYTESUR NEGATIVE 04/05/2020 2200   Sepsis Labs: @LABRCNTIP (procalcitonin:4,lacticidven:4)  ) Recent Results (from the past 240 hour(s))  SARS Coronavirus 2 by RT PCR (hospital order, performed in [facility] hospital lab) Nasopharyngeal Nasopharyngeal Swab     Status: None   Collection Time: 04/04/20 10:12 AM   Specimen:  Nasopharyngeal Swab  Result Value Ref Range Status   SARS Coronavirus 2 NEGATIVE NEGATIVE Final    Comment: (NOTE) SARS-CoV-2 target nucleic acids are NOT DETECTED.  The SARS-CoV-2 RNA is generally detectable in upper and lower respiratory specimens during the acute phase of infection. The lowest concentration of SARS-CoV-2 viral copies this assay can detect is 250 copies / mL. A negative result does not preclude SARS-CoV-2 infection and should not be used as the sole basis for treatment or other patient management decisions.  A negative result may occur with improper specimen collection / handling, submission of specimen other than nasopharyngeal swab, presence of viral mutation(s) within the areas targeted by this assay, and inadequate number of viral copies (<250 copies / mL). A negative result must be combined with clinical observations, patient history, and epidemiological information.  Fact Sheet for Patients:   06/04/20  Fact Sheet for Healthcare Providers: BoilerBrush.com.cy  This test is not yet approved or  cleared by the https://pope.com/ FDA and has been authorized for detection and/or diagnosis of SARS-CoV-2 by FDA under an Emergency Use Authorization (EUA).  This EUA will remain in effect (meaning this test can be used) for the duration of the COVID-19 declaration under Section 564(b)(1) of the Act, 21 U.S.C. section 360bbb-3(b)(1), unless the authorization is terminated or revoked sooner.  Performed at [facility], [address], [place], [place]   SARS Coronavirus 2 by RT PCR (hospital order, performed in [facility] hospital lab) Nasopharyngeal Nasopharyngeal Swab     Status: None   Collection Time: 04/06/20  5:54 PM   Specimen: Nasopharyngeal Swab  Result Value Ref Range Status   SARS Coronavirus 2 NEGATIVE NEGATIVE Final    Comment: (NOTE) SARS-CoV-2 target nucleic acids are NOT  DETECTED.  The SARS-CoV-2 RNA is generally detectable in upper and lower respiratory specimens during the acute phase of infection. The lowest concentration of SARS-CoV-2 viral copies this assay can detect is 250 copies / mL. A negative result does not preclude SARS-CoV-2 infection and should not be used as the sole basis for treatment or other patient management decisions.  A negative result may occur with improper specimen collection / handling, submission of specimen other than nasopharyngeal swab, presence of viral mutation(s) within the areas targeted by this assay, and inadequate number of viral copies (<250 copies / mL). A negative result must be combined with clinical observations, patient history, and epidemiological information.  Fact Sheet for Patients:   06/06/20  Fact Sheet for Healthcare Providers: BoilerBrush.com.cy  This test is not yet approved or  cleared by the https://pope.com/ FDA and has been authorized for detection and/or diagnosis of  SARS-CoV-2 by FDA under an Emergency Use Authorization (EUA).  This EUA will remain in effect (meaning this test can be used) for the duration of the COVID-19 declaration under Section 564(b)(1) of the Act, 21 U.S.C. section 360bbb-3(b)(1), unless the authorization is terminated or revoked sooner.  Performed at [facility], [address], [place], [place] [address]       Studies: No results found.  Scheduled Meds: . ARIPiprazole  5 mg Oral Daily  . aspirin  300 mg Rectal Daily   Or  . aspirin  325 mg Oral Daily  . atorvastatin  80 mg Oral Daily  . clopidogrel  75 mg Oral Daily  . enoxaparin (LOVENOX) injection  40 mg Subcutaneous Q24H  . folic acid  1 mg Oral Daily  . insulin aspart  0-15 Units Subcutaneous TID WC  . insulin aspart  0-5 Units Subcutaneous QHS  . insulin aspart  8 Units Subcutaneous TID WC  . insulin detemir  45 Units  Subcutaneous QHS  . multivitamin with minerals  1 tablet Oral Daily  . thiamine  100 mg Oral Daily   Or  . thiamine  100 mg Intravenous Daily  . traZODone  100 mg Oral QHS    Continuous Infusions:   LOS: 6 days     Darlin Drop, MD [facility] Pager [phone]  If 7PM-7AM, please contact night-coverage [email or web address] Password [email or web address] 04/11/2020, 1:25 PM

## 2020-04-12 LAB — MAGNESIUM: Magnesium: 1.5 mg/dL — ABNORMAL LOW (ref 1.7–2.4)

## 2020-04-12 LAB — POTASSIUM: Potassium: 4.8 mmol/L (ref 3.5–5.1)

## 2020-04-12 LAB — GLUCOSE, CAPILLARY
Glucose-Capillary: 211 mg/dL — ABNORMAL HIGH (ref 70–99)
Glucose-Capillary: 144 mg/dL — ABNORMAL HIGH (ref 70–99)
Glucose-Capillary: 119 mg/dL — ABNORMAL HIGH (ref 70–99)
Glucose-Capillary: 334 mg/dL — ABNORMAL HIGH (ref 70–99)

## 2020-04-12 MED ORDER — POTASSIUM CHLORIDE CRYS ER 20 MEQ PO TBCR
40.0000 meq | EXTENDED_RELEASE_TABLET | Freq: Once | ORAL | Status: AC
  Administered 2020-04-12: 40 meq via ORAL
  Filled 2020-04-12: qty 2

## 2020-04-12 MED ORDER — MAGNESIUM SULFATE 2 GM/50ML IV SOLN
2.0000 g | Freq: Once | INTRAVENOUS | Status: AC
  Administered 2020-04-12: 2 g via INTRAVENOUS
  Filled 2020-04-12 (×2): qty 50

## 2020-04-12 NOTE — Discharge Summary (Addendum)
Discharge Summary  [name] RUE:[id number] DOB: 1961-07-05  PCP: System, Provider Not In  Admit date: 04/05/2020 Discharge date: 04/12/2020  Time spent: 35 minutes  Recommendations for Outpatient Follow-up:  1. Continue care at the psychiatric ward. 2. Follow up with neurology 3. Follow up with Psychiatry 4. Follow-up with your PCP 5. Take your medications as prescribed. 6. Continue PT OT with assistance and fall precautions.  Discharge Diagnoses:  Active Hospital Problems   Diagnosis Date Noted  . Schizoaffective disorder, bipolar type (HCC) 02/05/2016  . CVA (cerebral vascular accident) (HCC) 04/05/2020  . Frequent falls 04/05/2020  . 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 Suicidal ideation 02/06/2016  . Hyperlipidemia 01/18/2015  . Diabetes mellitus (HCC) 03/11/2012    Resolved Hospital Problems  No resolved problems to display.    Discharge Condition: Stable  Diet recommendation: Heart healthy carb modified diet.  Vitals:   04/11/20 1958 04/12/20 0556  BP: 134/74 120/71  Pulse: 84 73  Resp: 18 18  Temp: 99.1 F (37.3 C) (!) 97.5 F (36.4 C)  SpO2: 100% 100%    History of present illness:  59 year old male with history of diabetes mellitus type 2, hepatitis C, bipolar disorder presented with suicidal ideations, attempt, and falls. He reportedly attempted to place a gun to his head and pulled the trigger, but it was jammed. Patient did admit to recently using cocaine and alcohol 6 days prior to presentation. In the ED, MRI of the brain done on 04/05/2020 was significant for acute on chronic lacunar infarct at the dorsal right lentiform/posterior limb of the right internal capsule. Neurology/psychiatry were consulted.Neurology recommended aspirin and Plavix for 3 months along with Lipitor with outpatient follow-up with neurology. Psychiatry recommended inpatient psychiatric hospitalization. One-to-one sitter in place for patient's  own safety.  Currently on oral therapies and medically cleared for discharge.  Patient will be discharged to inpatient psychiatric facility once bed is available.   04/11/2020 reported left first great toe pain, x-ray complete of left foot was negative for acute abnormalities.  Currently his pain has resolved.  04/12/2020: Seen and examined.  No acute events overnight.  He has no new complaints.  Awaiting bed placement for inpatient psychiatric hospitalization.   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 Tolerating diet as per SLP recommendations. -LDL 171, goal LDL less than 70. Hemoglobin A1c 14.2, goal A1c less than 7.0. -Carotid ultrasound showed bilateral 1 to 39% ICA stenosis. 2D echo showed EF of 55 to 60% with grade 2 diastolic dysfunction -Neurology has signed off. 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 Outpatient follow-up  Suicidal attempt/schizoaffective /bipolar disorder Recent suicidal attempt with a gun Continue 1-to-1 sitter Plan to dc to psych ward Continue Abilify and trazodone as recommended by psych. Awaiting bed placement for inpatient psychiatric  hospitalization  Resolved left great toe pain Reports left great toe pain.  Denies prior hx of gout.  X-ray negative for acute abnormalities. Analgesic as needed.  Twomm vascular protrusion arising fromRICA/CCAbifurcation-infundibulum versus small aneurysm. Outpatient follow-up with repeat CTA in a year as per neurology Follow-up with neurology outpatient.  Diabetes mellitus type 2 uncontrolled with hyperglycemia -A1c 14.2. Continue Levemir along with Humalog. Carb modified diet.  Resume Metformin at discharge.  Hold glipizide.  Outpatient follow-up with PCP  Recurrent falls/ambulatory dysfunction -PT recommends SNF.He will likely need to go to SNF from inpatient psychiatric hospital.  Continue PT OT with assistance and fall precautions  Hyperlipidemia LDL 171, goal less than 70. High intensity statin, Lipitor 80 mg daily. Follow-up with your PCP  Polysubstance abuse -Longstanding history of cocaine/alcohol abuse.  Urine drug screen was positive for cocaine.  Polysubstance cessation counseling.     Code Status: Full  Family Communication: None at bedside     Consultants:  Psychiatry  Procedures:  None  Antimicrobials:  None  DVT prophylaxis:  None   Disposition Plan:   Status is: Inpatient    Dispo: The patient is from: Home  Anticipated d/c is to: Psych ward   Anticipated d/c date is: 04/12/20  Patient currently medically stable for discharge.   Discharge Exam: BP 120/71 (BP Location: Right Arm)   Pulse 73   Temp (!) 97.5 F (36.4 C) (Oral)   Resp 18   Ht 5\' 9"  (1.753 m)   Wt 75.3 kg   SpO2 100%   BMI 24.51 kg/m  . General: 59 y.o. year-old male well developed well nourished in no acute distress.  Alert and oriented x3. . Cardiovascular: Regular rate and rhythm with no rubs or gallops.  No thyromegaly or JVD noted.   Marland Kitchen Respiratory: Clear to auscultation with  no wheezes or rales. Good inspiratory effort. . Abdomen: Soft nontender nondistended with normal bowel sounds x4 quadrants. . Musculoskeletal: No lower extremity edema. 2/4 pulses in all 4 extremities. Marland Kitchen Psychiatry: Mood is appropriate for condition and setting  Discharge Instructions You were cared for by a hospitalist during your hospital stay. If you have any questions about your discharge medications or the care you received while you were in the hospital after you are discharged, you can call the unit and asked to speak with the hospitalist on call if the hospitalist that took care of you is not available. Once you are discharged, your primary care physician will handle any further medical issues. Please note that NO REFILLS for any discharge medications will be authorized once you are discharged, as it is imperative that you return to your primary care physician (or establish a relationship with a primary care physician if you do not have one) for your aftercare needs so that they can reassess your need for medications and monitor your lab values.  Discharge Instructions    Ambulatory referral to Neurology   Complete by: As directed    An appointment is requested in approximately: 2-3 weeks for follow-up of stroke   Diet - low sodium heart healthy   Complete by: As directed    Diet Carb Modified   Complete by: As directed    Increase activity slowly   Complete by: As directed      Allergies as of 04/12/2020      Reactions   Wellbutrin [bupropion] Hives, Swelling      Medication List    STOP taking these medications   FLUoxetine 40 MG  capsule Commonly known as: PROZAC   glipiZIDE 10 MG tablet Commonly known as: GLUCOTROL   hydrOXYzine 25 MG tablet Commonly known as: ATARAX/VISTARIL   QUEtiapine 200 MG tablet Commonly known as: SEROQUEL   zolpidem 5 MG tablet Commonly known as: AMBIEN     TAKE these medications   ARIPiprazole 5 MG tablet Commonly known as:  ABILIFY Take 1 tablet (5 mg total) by mouth daily. What changed:   medication strength  how much to take   aspirin 325 MG tablet Take 1 tablet (325 mg total) by mouth daily.   atorvastatin 80 MG tablet Commonly known as: LIPITOR Take 1 tablet (80 mg total) by mouth daily.   clopidogrel 75 MG tablet Commonly known as: PLAVIX Take 1 tablet (75 mg total) by mouth daily.   folic acid 1 MG tablet Commonly known as: FOLVITE Take 1 tablet (1 mg total) by mouth daily.   HumaLOG KwikPen 100 UNIT/ML KwikPen Generic drug: insulin lispro Inject 0-0.1 mLs (0-10 Units total) into the skin 3 (three) times daily with meals.   insulin detemir 100 UNIT/ML injection Commonly known as: LEVEMIR Inject 0.45 mLs (45 Units total) into the skin at bedtime. What changed:   how much to take  additional instructions   metFORMIN 1000 MG tablet Commonly known as: GLUCOPHAGE Take 1 tablet (1,000 mg total) by mouth 2 (two) times daily with a meal.   multivitamin with minerals Tabs tablet Take 1 tablet by mouth daily.   thiamine 100 MG tablet Take 1 tablet (100 mg total) by mouth daily.   traZODone 100 MG tablet Commonly known as: DESYREL Take 1 tablet (100 mg total) by mouth at bedtime.            Durable Medical Equipment  (From admission, onward)         Start     Ordered   04/10/20 1439  For home use only DME 4 wheeled rolling walker with seat  Once       Question:  Patient needs a walker to treat with the following condition  Answer:  Fear for personal safety   04/10/20 1439         Allergies  Allergen Reactions  . Wellbutrin [Bupropion] Hives and Swelling    Follow-up Information    Milon Dikes, MD. Call in 1 day(s).   Specialty: Neurology Why: Please call for a post hospital follow-up appointment. Contact information: [address] [place] [address] [phone]        [facility]. Call in 1 day(s).   Why: Please  call for a post hospital follow-up appointment. Contact information: [address] [place] [address] [phone]               The results of significant diagnostics from this hospitalization (including imaging, microbiology, ancillary and laboratory) are listed below for reference.    Significant Diagnostic Studies: CT ANGIO HEAD W OR WO CONTRAST  Result Date: 04/06/2020 CLINICAL DATA:  Stroke/TIA, assess extracranial arteries, right internal capsule stroke. EXAM: CT ANGIOGRAPHY HEAD AND NECK TECHNIQUE: Multidetector CT imaging of the head and neck was performed using the standard protocol during bolus administration of intravenous contrast. Multiplanar CT image reconstructions and MIPs were obtained to evaluate the vascular anatomy. Carotid stenosis measurements (when applicable) are obtained utilizing NASCET criteria, using the distal internal carotid diameter as the denominator. CONTRAST:  OMNIPAQUE IOHEXOL 350 MG/ML SOLN COMPARISON:  Brain MRI performed earlier  the same day 04/05/2020, prior head CT examinations 04/05/2020 and earlier. FINDINGS: CT HEAD FINDINGS Brain: A punctate acute infarct within the right basal ganglia/posterior limb of right internal capsule was better appreciated on the brain MRI performed earlier the same day. Advanced ill-defined hypoattenuation within the cerebral white matter consistent with chronic small vessel ischemic disease. Redemonstrated chronic lacunar infarcts within the bilateral corona radiata/basal ganglia and right thalamus. Also redemonstrated is a small chronic infarct within the left cerebellar hemisphere. There is no acute intracranial hemorrhage. No demarcated cortical infarct is identified. No extra-axial fluid collection. No evidence of intracranial mass. No midline shift. Vascular: Reported below. Skull: Normal. Negative for fracture or focal lesion. Sinuses: Minimal ethmoid and maxillary sinus mucosal thickening.  No significant mastoid effusion. Orbits: No acute finding. Review of the MIP images confirms the above findings CTA NECK FINDINGS Aortic arch: Common origin of the innominate and left common carotid arteries. Atherosclerotic plaque within the visualized aortic arch. No hemodynamically significant innominate or proximal subclavian artery stenosis. Right carotid system: CCA and ICA patent within the neck without significant stenosis (50% or greater). Minimal mixed plaque within the prox ICA. Left carotid system: CCA and ICA patent within the neck without significant stenosis (50% or greater). Mild mixed plaque within the carotid bifurcation and proximal ICA. This includes a small focus of ulcerated plaque along the posterior aspect of the carotid bulb (series 14, image 131) Vertebral arteries: Patent bilaterally without significant stenosis. The left vertebral artery is slightly dominant. Skeleton: No acute bony abnormality or aggressive osseous lesion. Cervical spondylosis with multilevel disc space narrowing, posterior disc osteophytes, uncovertebral and facet hypertrophy. Additionally, there are multilevel prominent ventral osteophytes most notably at the C5-C6 and C6-C7 levels. Other neck: No neck mass or cervical lymphadenopathy. Upper chest: No consolidation within the imaged lung apices. Review of the MIP images confirms the above findings CTA HEAD FINDINGS Anterior circulation: The intracranial internal carotid arteries are patent. Minimal calcified plaque within the intracranial left ICA without stenosis. The M1 middle cerebral arteries are patent without significant stenosis. Mild-to-moderate segmental narrowing of the proximal to mid M1 left MCA (series 15, image 50). No M2 proximal branch occlusion is identified. There are high-grade focal stenoses within multiple distal M2 left MCA branch vessels (for instance as seen on series 17, image 28). The anterior cerebral arteries are patent. Moderate/severe  segmental stenosis within the A2 right anterior cerebral artery (series 17, image 22). 2 mm inferiorly projecting vascular protrusion arising from the paraclinoid right ICA, which may reflect an infundibulum or small aneurysm (series 14, image 87). Posterior circulation: The intracranial vertebral arteries are patent. The basilar artery is patent. Fetal origin left posterior cerebral artery. The posterior cerebral arteries are patent bilaterally. There is a moderate focal stenosis within the P1 right PCA. Severe focal stenosis within the proximal P2 right PCA. Moderate/severe focal stenoses within the left posterior communicating artery and P2 left PCA (series 15, image 50). The right posterior communicating artery is hypoplastic or absent. Venous sinuses: Within limitations of contrast timing, no convincing thrombus. Anatomic variants: As described Review of the MIP images confirms the above findings IMPRESSION: CT head: 1. A punctate acute infarct within the right basal ganglia/posterior limb of right internal capsule was better appreciated on the brain MRI performed earlier the same day. 2. 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 3. Redemonstrated small chronic infarct within the left  cerebellum. CTA neck: The common carotid, internal carotid and vertebral arteries are patent within the neck without hemodynamically significant stenosis. Mild calcified plaque within the carotid bifurcations/proximal ICAs bilaterally. This includes a focus of ulcerated plaque within the posterior aspect of the left carotid bulb. CTA head: 1. No intracranial large vessel occlusion. 2. Intracranial atherosclerotic disease with multifocal stenoses, most notably as follows. 3. Mild-to-moderate segmental narrowing of the proximal to mid M1 left MCA. 4. High-grade focal stenoses within  multiple distal M2 left MCA branch vessels. 5. Moderate/severe segmental stenosis within the A2 right anterior cerebral artery. 6. Moderate/severe focal stenosis within the P1 right PCA 7. Severe focal stenosis within the P2 right PCA. 8. The left PCA is fetal in origin. Moderate/severe focal stenoses within the left posterior communicating artery and P2 left PCA. 9. 2 mm vascular protrusion arising from the paraclinoid right ICA which may reflect an infundibulum or small aneurysm. Electronically Signed   By: Jackey Loge DO   On: 04/06/2020 14:34   DG Chest 2 View  Result Date: 04/04/2020 CLINICAL DATA:  Hyperglycemic, suicidal thoughts X 3 days. History of hypertension, diabetes, schizoaffective and bipolar disorder, hepatitis. EXAM: CHEST - 2 VIEW COMPARISON:  Chest x-ray 02/28/2016. chest x-ray 02/03/2013 FINDINGS: The heart size and mediastinal contours are within normal limits. Both lungs are clear. No acute osseous finding. Multilevel degenerative changes of the spine with chronic mild anterior wedge shaped compression deformity of a midthoracic body IMPRESSION: No active cardiopulmonary disease. Electronically Signed   By: Tish Frederickson M.D.   On: 04/04/2020 10:48   CT Head Wo Contrast  Result Date: 04/05/2020 CLINICAL DATA:  Trauma EXAM: CT HEAD WITHOUT CONTRAST CT CERVICAL SPINE WITHOUT CONTRAST TECHNIQUE: Multidetector CT imaging of the head and cervical spine was performed following the standard protocol without intravenous contrast. Multiplanar CT image reconstructions of the cervical spine were also generated. COMPARISON:  01/18/2015 head CT. FINDINGS: CT HEAD FINDINGS Brain: Age indeterminate focal hypodensities involving the left basal ganglia (3:14) and superior left cerebellum (7:46, new since prior exam. Additional scattered and confluent hypodense foci involving the periventricular and subcortical white matter are nonspecific however may reflect chronic microvascular ischemic changes. No  mass lesion. No midline shift, ventriculomegaly or extra-axial fluid collection. Mild cerebral atrophy with ex vacuo dilatation. Vascular: No hyperdense vessel or unexpected calcification. Skull: Negative for fracture or focal lesion. Sinuses/Orbits: Normal orbits. Clear paranasal sinuses. No mastoid effusion. Other: None. CT CERVICAL SPINE FINDINGS Alignment: Straightening of lordosis.  No listhesis. Skull base and vertebrae: No acute fracture. No primary bone lesion or focal pathologic process. Soft tissues and spinal canal: No prevertebral fluid or swelling. No visible canal hematoma. Disc levels: Multilevel spondylosis most prominent at the C5-7 levels. Patent bony spinal canal. Multilevel mild-to-moderate bony neural foraminal narrowing secondary to uncovertebral and facet hypertrophy. Upper chest: Clear lung apices. Other: None. IMPRESSION: Focal hypodensities involving the left basal ganglia and superior left cerebellum are age indeterminate. Consider MRI brain to exclude subacute insults. Mild cerebral atrophy and chronic microvascular ischemic changes. No acute fracture or traumatic listhesis.  Multilevel spondylosis. These results were called by telephone at the time of interpretation on 04/05/2020 at 10:51 am to provider [facility] , who verbally acknowledged these results. Electronically Signed   By: Stana Bunting M.D.   On: 04/05/2020 11:02   CT ANGIO NECK W OR WO CONTRAST  Result Date: 04/06/2020 CLINICAL DATA:  Stroke/TIA, assess extracranial arteries, right internal capsule stroke. EXAM: CT ANGIOGRAPHY HEAD AND NECK  TECHNIQUE: Multidetector CT imaging of the head and neck was performed using the standard protocol during bolus administration of intravenous contrast. Multiplanar CT image reconstructions and MIPs were obtained to evaluate the vascular anatomy. Carotid stenosis measurements (when applicable) are obtained utilizing NASCET criteria, using the distal internal carotid diameter as  the denominator. CONTRAST:  OMNIPAQUE IOHEXOL 350 MG/ML SOLN COMPARISON:  Brain MRI performed earlier the same day 04/05/2020, prior head CT examinations 04/05/2020 and earlier. FINDINGS: CT HEAD FINDINGS Brain: A punctate acute infarct within the right basal ganglia/posterior limb of right internal capsule was better appreciated on the brain MRI performed earlier the same day. Advanced ill-defined hypoattenuation within the cerebral white matter consistent with chronic small vessel ischemic disease. Redemonstrated chronic lacunar infarcts within the bilateral corona radiata/basal ganglia and right thalamus. Also redemonstrated is a small chronic infarct within the left cerebellar hemisphere. There is no acute intracranial hemorrhage. No demarcated cortical infarct is identified. No extra-axial fluid collection. No evidence of intracranial mass. No midline shift. Vascular: Reported below. Skull: Normal. Negative for fracture or focal lesion. Sinuses: Minimal ethmoid and maxillary sinus mucosal thickening. No significant mastoid effusion. Orbits: No acute finding. Review of the MIP images confirms the above findings CTA NECK FINDINGS Aortic arch: Common origin of the innominate and left common carotid arteries. Atherosclerotic plaque within the visualized aortic arch. No hemodynamically significant innominate or proximal subclavian artery stenosis. Right carotid system: CCA and ICA patent within the neck without significant stenosis (50% or greater). Minimal mixed plaque within the prox ICA. Left carotid system: CCA and ICA patent within the neck without significant stenosis (50% or greater). Mild mixed plaque within the carotid bifurcation and proximal ICA. This includes a small focus of ulcerated plaque along the posterior aspect of the carotid bulb (series 14, image 131) Vertebral arteries: Patent bilaterally without significant stenosis. The left vertebral artery is slightly dominant. Skeleton: No acute  bony abnormality or aggressive osseous lesion. Cervical spondylosis with multilevel disc space narrowing, posterior disc osteophytes, uncovertebral and facet hypertrophy. Additionally, there are multilevel prominent ventral osteophytes most notably at the C5-C6 and C6-C7 levels. Other neck: No neck mass or cervical lymphadenopathy. Upper chest: No consolidation within the imaged lung apices. Review of the MIP images confirms the above findings CTA HEAD FINDINGS Anterior circulation: The intracranial internal carotid arteries are patent. Minimal calcified plaque within the intracranial left ICA without stenosis. The M1 middle cerebral arteries are patent without significant stenosis. Mild-to-moderate segmental narrowing of the proximal to mid M1 left MCA (series 15, image 50). No M2 proximal branch occlusion is identified. There are high-grade focal stenoses within multiple distal M2 left MCA branch vessels (for instance as seen on series 17, image 28). The anterior cerebral arteries are patent. Moderate/severe segmental stenosis within the A2 right anterior cerebral artery (series 17, image 22). 2 mm inferiorly projecting vascular protrusion arising from the paraclinoid right ICA, which may reflect an infundibulum or small aneurysm (series 14, image 87). Posterior circulation: The intracranial vertebral arteries are patent. The basilar artery is patent. Fetal origin left posterior cerebral artery. The posterior cerebral arteries are patent bilaterally. There is a moderate focal stenosis within the P1 right PCA. Severe focal stenosis within the proximal P2 right PCA. Moderate/severe focal stenoses within the left posterior communicating artery and P2 left PCA (series 15, image 50). The right posterior communicating artery is hypoplastic or absent. Venous sinuses: Within limitations of contrast timing, no convincing thrombus. Anatomic variants: As described Review of the MIP images  confirms the above findings  IMPRESSION: CT head: 1. A punctate acute infarct within the right basal ganglia/posterior limb of right internal capsule was better appreciated on the brain MRI performed earlier the same day. 2. 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 3. Redemonstrated small chronic infarct within the left cerebellum. CTA neck: The common carotid, internal carotid and vertebral arteries are patent within the neck without hemodynamically significant stenosis. Mild calcified plaque within the carotid bifurcations/proximal ICAs bilaterally. This includes a focus of ulcerated plaque within the posterior aspect of the left carotid bulb. CTA head: 1. No intracranial large vessel occlusion. 2. Intracranial atherosclerotic disease with multifocal stenoses, most notably as follows. 3. Mild-to-moderate segmental narrowing of the proximal to mid M1 left MCA. 4. High-grade focal stenoses within multiple distal M2 left MCA branch vessels. 5. Moderate/severe segmental stenosis within the A2 right anterior cerebral artery. 6. Moderate/severe focal stenosis within the P1 right PCA 7. Severe focal stenosis within the P2 right PCA. 8. The left PCA is fetal in origin. Moderate/severe focal stenoses within the left posterior communicating artery and P2 left PCA. 9. 2 mm vascular protrusion arising from the paraclinoid right ICA which may reflect an infundibulum or small aneurysm. Electronically Signed   By: Jackey Loge DO   On: 04/06/2020 14:34   CT Cervical Spine Wo Contrast  Result Date: 04/05/2020 CLINICAL DATA:  Trauma EXAM: CT HEAD WITHOUT CONTRAST CT CERVICAL SPINE WITHOUT CONTRAST TECHNIQUE: Multidetector CT imaging of the head and cervical spine was performed following the standard protocol without intravenous contrast. Multiplanar CT image reconstructions of the cervical spine were also  generated. COMPARISON:  01/18/2015 head CT. FINDINGS: CT HEAD FINDINGS Brain: Age indeterminate focal hypodensities involving the left basal ganglia (3:14) and superior left cerebellum (7:46, new since prior exam. Additional scattered and confluent hypodense foci involving the periventricular and subcortical white matter are nonspecific however may reflect chronic microvascular ischemic changes. No mass lesion. No midline shift, ventriculomegaly or extra-axial fluid collection. Mild cerebral atrophy with ex vacuo dilatation. Vascular: No hyperdense vessel or unexpected calcification. Skull: Negative for fracture or focal lesion. Sinuses/Orbits: Normal orbits. Clear paranasal sinuses. No mastoid effusion. Other: None. CT CERVICAL SPINE FINDINGS Alignment: Straightening of lordosis.  No listhesis. Skull base and vertebrae: No acute fracture. No primary bone lesion or focal pathologic process. Soft tissues and spinal canal: No prevertebral fluid or swelling. No visible canal hematoma. Disc levels: Multilevel spondylosis most prominent at the C5-7 levels. Patent bony spinal canal. Multilevel mild-to-moderate bony neural foraminal narrowing secondary to uncovertebral and facet hypertrophy. Upper chest: Clear lung apices. Other: None. IMPRESSION: Focal hypodensities involving the left basal ganglia and superior left cerebellum are age indeterminate. Consider MRI brain to exclude subacute insults. Mild cerebral atrophy and chronic microvascular ischemic changes. No acute fracture or traumatic listhesis.  Multilevel spondylosis. These results were called by telephone at the time of interpretation on 04/05/2020 at 10:51 am to provider [facility] , who verbally acknowledged these results. Electronically Signed   By: Stana Bunting M.D.   On: 04/05/2020 11:02   MR Brain Wo Contrast (neuro protocol)  Result Date: 04/05/2020 CLINICAL DATA:  59 year old male status post trauma, neurologic deficit with left side  weakness. Age indeterminate ischemia in the left basal ganglia and left cerebellum on plain head CT earlier today. EXAM: MRI HEAD WITHOUT CONTRAST TECHNIQUE: Multiplanar, multiecho pulse sequences of the brain and surrounding  structures were obtained without intravenous contrast. COMPARISON:  Head CT 1029 hours today. FINDINGS: Brain: Questionable punctate focus of restricted diffusion at the dorsal right lentiform near the posterior limb of the right internal capsule, occurring in an area of chronic small vessel ischemia and encephalomalacia (series 5, image 80 and series 8, image 15). No other restricted diffusion. 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 Moderate patchy T2 hyperintensity in the right paracentral pons also most resembles chronic small-vessel ischemia. No cortical encephalomalacia or definite chronic blood products. No midline shift, mass effect, evidence of mass lesion, ventriculomegaly, extra-axial collection or acute intracranial hemorrhage. Cervicomedullary junction and pituitary are within normal limits. Vascular: Major intracranial vascular flow voids are preserved. Skull and upper cervical spine: Negative for age visible cervical spine, bone marrow signal. Sinuses/Orbits: Negative orbits. Paranasal sinuses and mastoids are stable and well pneumatized. Other: There is a small volume of fluid trapped in the left petrous apex (series 10, image 12) with facilitated diffusion and no complicating features. Otherwise grossly normal visible internal auditory structures. Scalp and face appear negative. IMPRESSION: 1. Suspect punctate acute on chronic lacunar infarct at the dorsal right lentiform / posterior limb of the right internal capsule. No associated hemorrhage or mass effect. 2.  Underlying advanced chronic small vessel disease, with chronic ischemia in the left basal ganglia and the left cerebellum SCA territory corresponding to head CT findings earlier today. Electronically Signed   By: Odessa Fleming M.D.   On: 04/05/2020 13:44   DG Knee Complete 4 Views Left  Result Date: 04/05/2020 CLINICAL DATA:  Left knee pain after fall. EXAM: LEFT KNEE - COMPLETE 4+ VIEW COMPARISON:  None. FINDINGS: No evidence of fracture, dislocation, or joint effusion. No evidence of arthropathy or other focal bone abnormality. Soft tissues are unremarkable. IMPRESSION: Negative. Electronically Signed   By: Lupita Raider M.D.   On: 04/05/2020 11:03   DG Foot Complete Left  Result Date: 04/11/2020 CLINICAL DATA:  LEFT great toe pain, no injury EXAM: LEFT FOOT - COMPLETE 3+ VIEW COMPARISON:  None FINDINGS: Osseous demineralization. Joint spaces preserved. No acute fracture, dislocation, or bone destruction. IMPRESSION: No acute abnormalities. Electronically Signed   By: Ulyses Southward M.D.   On: 04/11/2020 14:36   ECHOCARDIOGRAM COMPLETE  Result Date: 04/06/2020    ECHOCARDIOGRAM REPORT   Patient Name:   NOX TALENT Date of Exam: 04/06/2020 Medical Rec #:  [id number]      Height:       69.0 in Accession #:    [id number]     Weight:       175.0 lb Date of Birth:  January 26, 1961       BSA:          1.952 m Patient Age:    59 years       BP:           125/70 mmHg Patient Gender: M              HR:           68 bpm. Exam Location:  Inpatient Procedure: 2D Echo, Cardiac Doppler, Color Doppler and Strain Analysis Indications:    Stroke  History:        Patient has prior history of Echocardiogram examinations, most  recent 02/29/2016. Stroke, Signs/Symptoms:Chest Pain; Risk                 Factors:Diabetes and Dyslipidemia. Cocaine use.  Sonographer:    Sheralyn Boatman RDCS Referring Phys: [id number] RONDELL A SMITH  Sonographer Comments: Technically difficult study due to poor echo windows. Global longitudinal strain  was attempted. IMPRESSIONS  1. Left ventricular ejection fraction, by estimation, is 55 to 60%. The left ventricle has normal function. The left ventricle has no regional wall motion abnormalities. There is moderate left ventricular hypertrophy. Left ventricular diastolic parameters are consistent with Grade II diastolic dysfunction (pseudonormalization). The average left ventricular global longitudinal strain is -18.1 %.  2. Right ventricular systolic function is normal. The right ventricular size is normal.  3. The mitral valve is grossly normal. Trivial mitral valve regurgitation.  4. The aortic valve is grossly normal. Aortic valve regurgitation is not visualized. No aortic stenosis is present. FINDINGS  Left Ventricle: Left ventricular ejection fraction, by estimation, is 55 to 60%. The left ventricle has normal function. The left ventricle has no regional wall motion abnormalities. The average left ventricular global longitudinal strain is -18.1 %. The left ventricular internal cavity size was normal in size. There is moderate left ventricular hypertrophy. Left ventricular diastolic parameters are consistent with Grade II diastolic dysfunction (pseudonormalization). Right Ventricle: The right ventricular size is normal. No increase in right ventricular wall thickness. Right ventricular systolic function is normal. Left Atrium: Left atrial size was normal in size. Right Atrium: Right atrial size was normal in size. Pericardium: There is no evidence of pericardial effusion. Mitral Valve: The mitral valve is grossly normal. Trivial mitral valve regurgitation. Tricuspid Valve: The tricuspid valve is grossly normal. Tricuspid valve regurgitation is trivial. Aortic Valve: The aortic valve is grossly normal. Aortic valve regurgitation is not visualized. No aortic stenosis is present. Pulmonic Valve: The pulmonic valve was normal in structure. Pulmonic valve regurgitation is not visualized. Aorta: The aortic root and  ascending aorta are structurally normal, with no evidence of dilitation. IAS/Shunts: The atrial septum is grossly normal.  LEFT VENTRICLE PLAX 2D LVIDd:         3.60 cm     Diastology LVIDs:         2.50 cm     LV e' medial:    7.07 cm/s LV PW:         1.70 cm     LV E/e' medial:  9.5 LV IVS:        1.30 cm     LV e' lateral:   10.60 cm/s LVOT diam:     1.90 cm     LV E/e' lateral: 6.3 LV SV:         45 LV SV Index:   23          2D Longitudinal Strain LVOT Area:     2.84 cm    2D Strain GLS Avg:     -18.1 %  LV Volumes (MOD) LV vol d, MOD A2C: 47.7 ml LV vol d, MOD A4C: 71.2 ml LV vol s, MOD A2C: 23.2 ml LV vol s, MOD A4C: 29.2 ml LV SV MOD A2C:     24.5 ml LV SV MOD A4C:     71.2 ml LV SV MOD BP:      32.5 ml RIGHT VENTRICLE             IVC RV S prime:     10.90 cm/s  IVC diam: 1.80 cm  TAPSE (M-mode): 1.9 cm LEFT ATRIUM             Index      RIGHT ATRIUM           Index LA diam:        4.00 cm 2.05 cm/m RA Area:     11.70 cm LA Vol (A2C):   13.0 ml 6.66 ml/m RA Volume:   25.20 ml  12.91 ml/m LA Vol (A4C):   18.2 ml 9.32 ml/m LA Biplane Vol: 16.8 ml 8.61 ml/m  AORTIC VALVE LVOT Vmax:   89.20 cm/s LVOT Vmean:  63.900 cm/s LVOT VTI:    0.159 m  AORTA Ao Root diam: 3.30 cm Ao Asc diam:  3.20 cm MITRAL VALVE MV Area (PHT): 2.73 cm    SHUNTS MV Decel Time: 278 msec    Systemic VTI:  0.16 m MV E velocity: 67.20 cm/s  Systemic Diam: 1.90 cm MV A velocity: 50.00 cm/s MV E/A ratio:  1.34 Kristeen Miss MD Electronically signed by Kristeen Miss MD Signature Date/Time: 04/06/2020/12:47:27 PM    Final    VAS US CAROTID  Result Date: 04/06/2020 Carotid Arterial Duplex Study Indications:       CVA. Risk Factors:      Prior CVA. Comparison Study:  No prior studies. Performing Technologist: Chanda Busing RVT  Examination Guidelines: A complete evaluation includes B-mode imaging, spectral Doppler, color Doppler, and power Doppler as needed of all accessible portions of each vessel. Bilateral testing is considered an  integral part of a complete examination. Limited examinations for reoccurring indications may be performed as noted.  Right Carotid Findings: +----------+--------+--------+--------+-----------------------+--------+           PSV cm/sEDV cm/sStenosisPlaque Description     Comments +----------+--------+--------+--------+-----------------------+--------+ CCA Prox  85      17              smooth and heterogenous         +----------+--------+--------+--------+-----------------------+--------+ CCA Distal42      14              smooth and heterogenous         +----------+--------+--------+--------+-----------------------+--------+ ICA Prox  38      16              smooth and heterogenous         +----------+--------+--------+--------+-----------------------+--------+ ICA Distal58      21                                              +----------+--------+--------+--------+-----------------------+--------+ ECA       87      16                                              +----------+--------+--------+--------+-----------------------+--------+ +----------+--------+-------+--------+-------------------+           PSV cm/sEDV cmsDescribeArm Pressure (mmHG) +----------+--------+-------+--------+-------------------+ [id number]                                        +----------+--------+-------+--------+-------------------+ +---------+--------+--+--------+--+---------+ VertebralPSV cm/s32EDV cm/s13Antegrade +---------+--------+--+--------+--+---------+  Left Carotid Findings: +----------+--------+--------+--------+-----------------------+--------+           PSV cm/sEDV cm/sStenosisPlaque Description  Comments +----------+--------+--------+--------+-----------------------+--------+ CCA Prox  75      15              smooth and heterogenous         +----------+--------+--------+--------+-----------------------+--------+ CCA Distal31      8                smooth and heterogenous         +----------+--------+--------+--------+-----------------------+--------+ ICA Prox  37      14              smooth and heterogenous         +----------+--------+--------+--------+-----------------------+--------+ ICA Distal84      36                                              +----------+--------+--------+--------+-----------------------+--------+ ECA       77      18                                              +----------+--------+--------+--------+-----------------------+--------+ +----------+--------+--------+--------+-------------------+           PSV cm/sEDV cm/sDescribeArm Pressure (mmHG) +----------+--------+--------+--------+-------------------+ [id number]                                          +----------+--------+--------+--------+-------------------+ +---------+--------+--+--------+--+---------+ VertebralPSV cm/s46EDV cm/s14Antegrade +---------+--------+--+--------+--+---------+   Summary: Right Carotid: Velocities in the right ICA are consistent with a 1-39% stenosis. Left Carotid: Velocities in the left ICA are consistent with a 1-39% stenosis. Vertebrals: Bilateral vertebral arteries demonstrate antegrade flow. *See table(s) above for measurements and observations.  Electronically signed by Sherald Hess MD on 04/06/2020 at 4:37:52 PM.    Final     Microbiology: Recent Results (from the past 240 hour(s))  SARS Coronavirus 2 by RT PCR (hospital order, performed in [facility] hospital lab) Nasopharyngeal Nasopharyngeal Swab     Status: None   Collection Time: 04/04/20 10:12 AM   Specimen: Nasopharyngeal Swab  Result Value Ref Range Status   SARS Coronavirus 2 NEGATIVE NEGATIVE Final    Comment: (NOTE) SARS-CoV-2 target nucleic acids are NOT DETECTED.  The SARS-CoV-2 RNA is generally detectable in upper and lower respiratory specimens during the acute phase of infection. The lowest concentration of  SARS-CoV-2 viral copies this assay can detect is 250 copies / mL. A negative result does not preclude SARS-CoV-2 infection and should not be used as the sole basis for treatment or other patient management decisions.  A negative result may occur with improper specimen collection / handling, submission of specimen other than nasopharyngeal swab, presence of viral mutation(s) within the areas targeted by this assay, and inadequate number of viral copies (<250 copies / mL). A negative result must be combined with clinical observations, patient history, and epidemiological information.  Fact Sheet for Patients:   BoilerBrush.com.cy  Fact Sheet for Healthcare Providers: https://pope.com/  This test is not yet approved or  cleared by the [place] FDA and has been authorized for detection and/or diagnosis of SARS-CoV-2 by FDA under an Emergency Use Authorization (EUA).  This EUA will remain in effect (meaning this test can be used) for the duration of the COVID-19  declaration under Section 564(b)(1) of the Act, 21 U.S.C. section 360bbb-3(b)(1), unless the authorization is terminated or revoked sooner.  Performed at [facility], [address] [place], [place] [address]   SARS Coronavirus 2 by RT PCR (hospital order, performed in [facility]) Nasopharyngeal Nasopharyngeal Swab     Status: None   Collection Time: 04/06/20  5:54 PM   Specimen: Nasopharyngeal Swab  Result Value Ref Range Status   SARS Coronavirus 2 NEGATIVE NEGATIVE Final    Comment: (NOTE) SARS-CoV-2 target nucleic acids are NOT DETECTED.  The SARS-CoV-2 RNA is generally detectable in upper and lower respiratory specimens during the acute phase of infection. The lowest concentration of SARS-CoV-2 viral copies this assay can detect is 250 copies / mL. A negative result does not preclude SARS-CoV-2 infection and should not be used as the sole basis  for treatment or other patient management decisions.  A negative result may occur with improper specimen collection / handling, submission of specimen other than nasopharyngeal swab, presence of viral mutation(s) within the areas targeted by this assay, and inadequate number of viral copies (<250 copies / mL). A negative result must be combined with clinical observations, patient history, and epidemiological information.  Fact Sheet for Patients:   BoilerBrush.com.cyhttps://www.fda.gov/media/136312/download  Fact Sheet for Healthcare Providers: https://pope.com/https://www.fda.gov/media/136313/download  This test is not yet approved or  cleared by the [place] FDA and has been authorized for detection and/or diagnosis of SARS-CoV-2 by FDA under an Emergency Use Authorization (EUA).  This EUA will remain in effect (meaning this test can be used) for the duration of the COVID-19 declaration under Section 564(b)(1) of the Act, 21 U.S.C. section 360bbb-3(b)(1), unless the authorization is terminated or revoked sooner.  Performed at [facility], [address] [place], [place] [address]      Labs: Basic Metabolic Panel: Recent Labs  Lab 04/06/20 0403 04/12/20 0851  NA 137  --   K 3.3* 4.8  CL 105  --   CO2 24  --   GLUCOSE 116*  --   BUN 20  --   CREATININE 0.91  --   CALCIUM 8.6*  --   MG 1.4* 1.5*  PHOS 3.7  --    Liver Function Tests: No results for input(s): AST, ALT, ALKPHOS, BILITOT, PROT, ALBUMIN in the last 168 hours. No results for input(s): LIPASE, AMYLASE in the last 168 hours. No results for input(s): AMMONIA in the last 168 hours. CBC: Recent Labs  Lab 04/06/20 0403  WBC 5.5  HGB 10.5*  HCT 30.5*  MCV 85.2  PLT 201   Cardiac Enzymes: No results for input(s): CKTOTAL, CKMB, CKMBINDEX, TROPONINI in the last 168 hours. BNP: BNP (last 3 results) No results for input(s): BNP in the last 8760 hours.  ProBNP (last 3 results) No results for input(s): PROBNP in  the last 8760 hours.  CBG: Recent Labs  Lab 04/11/20 1200 04/11/20 1625 04/11/20 2001 04/12/20 0815 04/12/20 1135  GLUCAP 144* 218* 292* 211* 144*       Signed:  Darlin Droparole N Shinita Mac, MD [facility] 04/12/2020, 12:46 PM

## 2020-04-12 NOTE — Care Management Important Message (Signed)
Important Message  Patient Details IM Letter given to the Patient Name: [name] MRN: [id number] Date of Birth: Mar 06, 1961   Medicare Important Message Given:  Yes     Caren Macadam 04/12/2020, 10:56 AM

## 2020-04-12 NOTE — TOC Progression Note (Addendum)
Transition of Care [facility]) - Progression Note    Patient Details  Name: [name] MRN: [id number] Date of Birth: February 02, 1961  [facility]) CM/SW Contact  Geni Bers, RN Phone Number: 04/12/2020, 12:15 PM  Clinical Narrative:    Pt was declined by the following Behavioral Health related to Medical:[facility], [facility], [facility], [facility] at capacity, [facility] answer, [facility]-medically declined, [facility]-no answer, [facility], [facility]-medically declined, [facility]-asked to re-fax. [facility] is evaluating pt now.    Expected Discharge Plan: Skilled Nursing Facility Barriers to Discharge: Continued Medical Work up  Expected Discharge Plan and Services Expected Discharge Plan: Skilled Nursing Facility In-house Referral: Clinical Social Work Discharge Planning Services: CM Consult Post Acute Care Choice: Skilled Nursing Facility Living arrangements for the past 2 months: Single Family Home Expected Discharge Date: 04/12/20                                     Social Determinants of Health (SDOH) Interventions    Readmission Risk Interventions No flowsheet data found.

## 2020-04-13 LAB — GLUCOSE, CAPILLARY
Glucose-Capillary: 200 mg/dL — ABNORMAL HIGH (ref 70–99)
Glucose-Capillary: 310 mg/dL — ABNORMAL HIGH (ref 70–99)
Glucose-Capillary: 171 mg/dL — ABNORMAL HIGH (ref 70–99)

## 2020-04-13 MED ORDER — ARIPIPRAZOLE 5 MG PO TABS
7.5000 mg | ORAL_TABLET | Freq: Every day | ORAL | Status: DC
  Administered 2020-04-14 – 2020-04-18 (×5): 7.5 mg via ORAL
  Filled 2020-04-13 (×5): qty 2

## 2020-04-13 NOTE — TOC Progression Note (Signed)
Transition of Care [facility]) - Progression Note    Patient Details  Name: [name] MRN: [id number] Date of Birth: [birth year]  Transition of Care [facility]) CM/SW Contact  Geni Bers, RN Phone Number: 04/13/2020, 12:58 PM  Clinical Narrative:    Spoke with pt concerning no facility has offered him a bed and that he was faxed [phone] to [facility] [phone] hospital. Waiting for answer.    Expected Discharge Plan: Skilled Nursing Facility Barriers to Discharge: Continued Medical Work up  Expected Discharge Plan and Services Expected Discharge Plan: Skilled Nursing Facility In-house Referral: Clinical Social Work Discharge Planning Services: CM Consult Post Acute Care Choice: Skilled Nursing Facility Living arrangements for the past 2 months: Single Family Home Expected Discharge Date: 04/13/20                                     Social Determinants of Health (SDOH) Interventions    Readmission Risk Interventions No flowsheet data found.

## 2020-04-13 NOTE — Progress Notes (Signed)
Pt SI sitter d.c'd and pt immediately successfully ambulated to the vending machine with the bed alrm sounding by the time staff reached pt room =t was already at vending machine and out of writer's vision. NP Psychiatry made aware by another RN on the unit Engineer, building services. Telesitter implemented at safety reminder. SRP, RN

## 2020-04-13 NOTE — Progress Notes (Signed)
Occupational Therapy Treatment Patient Details Name: [name] MRN: [id number] DOB: 08-08-1960 Today's Date: 04/13/2020    History of present illness 59 year old male with history of diabetes mellitus type 2, hepatitis C, bipolar disorder presented with suicidal ideations and falls.  He reportedly attempted to place a gun to his head and pulled the trigger, but it was jammed.  Patient did admit to recently using cocaine and alcohol 6 days ago.  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   OT comments  Today patient demonstrates ability to perform all bed mobility and ambulation in room and hall with rollator without assistance from therapist. Therapist did cue patient to remember to lock his brakes. Patient demonstrated ability to donn lower body clothing and perform toilet transfers. Patient reports independence with toileting and standing at the sink to perform grooming task. Patinet reports no deficits other than knee pain. Patient has met his goals and appears near his baseline. No further acute care OT services required at this time.    Follow Up Recommendations  No OT follow up    Equipment Recommendations  None recommended by OT    Recommendations for Other Services      Precautions / Restrictions Precautions Precautions: Fall Precaution Comments: s/p CVA mild/slight L hemi       Mobility Bed Mobility Overal bed mobility: Modified Independent                Transfers Overall transfer level: Modified independent Equipment used: 4-wheeled walker             General transfer comment: Patient performed ambulation in room and 100 feet in hall with Rollator. no assistance from therapist only supervision for continued assessment.    Balance Overall balance assessment: Mild deficits observed, not formally tested                                          ADL either performed or assessed with clinical judgement   ADL   Eating/Feeding: Independent Eating/Feeding Details (indicate cue type and reason): Reports independence with eating. Grooming: Standing;Oral care Grooming Details (indicate cue type and reason): reports independence with brushing teeth at sink.               Lower Body Dressing Details (indicate cue type and reason): Patient able to donn socks. Toilet Transfer: Supervision/safety;Independent;Regular Toilet;Grab bars;RW Armed forces technical officer Details (indicate cue type and reason): Patient demonstrates ability to perform toilet transfer with grab bars. Toileting- Clothing Manipulation and Hygiene: Supervision/safety Toileting - Clothing Manipulation Details (indicate cue type and reason): Demonstrates physical abilities to perform toileting. Patient reports independence with toileting.             Vision   Vision Assessment?: No apparent visual deficits   Perception     Praxis      Cognition Arousal/Alertness: Awake/alert Behavior During Therapy: WFL for tasks assessed/performed Overall Cognitive Status: Within Functional Limits for tasks assessed                                          Exercises     Shoulder Instructions       General Comments      Pertinent  Vitals/ Pain       Pain Assessment: 0-10 Pain Score: 9  Pain Location: l knee Pain Descriptors / Indicators: Sore Pain Intervention(s): Monitored during session  Home Living                                          Prior Functioning/Environment              Frequency           Progress Toward Goals  OT Goals(current goals can now be found in the care plan section)  Progress towards OT goals: Goals met/education completed, patient discharged from OT  Acute Rehab OT Goals Patient Stated Goal: "Get back to everything." OT Goal Formulation: All assessment and education  complete, DC therapy  Plan Discharge plan needs to be updated    Co-evaluation                 AM-PAC OT "6 Clicks" Daily Activity     Outcome Measure   Help from another person eating meals?: None Help from another person taking care of personal grooming?: None Help from another person toileting, which includes using toliet, bedpan, or urinal?: None Help from another person bathing (including washing, rinsing, drying)?: None Help from another person to put on and taking off regular upper body clothing?: None Help from another person to put on and taking off regular lower body clothing?: None 6 Click Score: 24    End of Session Equipment Utilized During Treatment:  (rollator)  OT Visit Diagnosis: Unsteadiness on feet (R26.81);Hemiplegia and hemiparesis;History of falling (Z91.81);Muscle weakness (generalized) (M62.81);Repeated falls (R29.6) Hemiplegia - Right/Left: Right Hemiplegia - dominant/non-dominant: Non-Dominant Hemiplegia - caused by: Cerebral infarction   Activity Tolerance Patient tolerated treatment well   Patient Left in bed;with call bell/phone within reach;with nursing/sitter in room;with bed alarm set   Nurse Communication Mobility status        Time: 1594-7076 OT Time Calculation (min): 8 min  Charges: OT General Charges $OT Visit: 1 Visit OT Treatments $Therapeutic Activity: 8-22 mins  Derl Barrow, OTR/L [place]  Office [phone] Pager: [place] 04/13/2020, 11:10 AM

## 2020-04-13 NOTE — TOC Progression Note (Signed)
Transition of Care [facility]) - Progression Note    Patient Details  Name: [name] MRN: [id number] Date of Birth: 13-Feb-1961  Transition of Care [facility]) CM/SW Contact  Geni Bers, RN Phone Number: 04/13/2020, 12:23 PM  Clinical Narrative:    [facility] was called, pt is from [facility]. [facility] referred pt to [facility] in [place], [place]. Clinical faxed to [phone].   Expected Discharge Plan: Skilled Nursing Facility Barriers to Discharge: Continued Medical Work up  Expected Discharge Plan and Services Expected Discharge Plan: Skilled Nursing Facility In-house Referral: Clinical Social Work Discharge Planning Services: CM Consult Post Acute Care Choice: Skilled Nursing Facility Living arrangements for the past 2 months: Single Family Home Expected Discharge Date: 04/13/20                                     Social Determinants of Health (SDOH) Interventions    Readmission Risk Interventions No flowsheet data found.

## 2020-04-13 NOTE — Progress Notes (Signed)
Physical Therapy Treatment Patient Details Name: [name] MRN: [id number] DOB: May 30, 1961 Today's Date: 04/13/2020    History of Present Illness 59 year old male with history of diabetes mellitus type 2, hepatitis C, bipolar disorder presented with suicidal ideations and falls.  He reportedly attempted to place a gun to his head and pulled the trigger, but it was jammed.  Patient did admit to recently using cocaine and alcohol 6 days ago.  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    PT Comments    General Gait Details: pt doing well with his new Rollator (vs cane) tolerated an increased distance and no LOB  Follow Up Recommendations  Other (comment) (Behavior Health)     Equipment Recommendations       Recommendations for Other Services       Precautions / Restrictions Precautions Precautions: Fall Precaution Comments: s/p CVA mild/slight L hemi    Mobility  Bed Mobility Overal bed mobility: Modified Independent                Transfers Overall transfer level: Modified independent   Transfers: Sit to/from [employer] Transfers Sit to Stand: Modified independent (Device/Increase time) Stand pivot transfers: Modified independent (Device/Increase time)       General transfer comment: pt doing well with his new rollator  Ambulation/Gait Ambulation/Gait assistance: Supervision Gait Distance (Feet): 500 Feet Assistive device: 4-wheeled walker Gait Pattern/deviations: Step-through pattern Gait velocity: decreased   General Gait Details: pt doing well with his new Rollator (vs cane) tolerated an increased distance and no LOB   Stairs             Wheelchair Mobility    Modified Rankin (Stroke Patients Only)       Balance Overall balance assessment: Mild deficits observed, not formally tested                                           Cognition Arousal/Alertness: Awake/alert Behavior During Therapy: WFL for tasks assessed/performed Overall Cognitive Status: Within Functional Limits for tasks assessed                                 General Comments: AxO x 3      Exercises      General Comments        Pertinent Vitals/Pain      Home Living                      Prior Function            PT Goals (current goals can now be found in the care plan section) Acute Rehab PT Goals Patient Stated Goal: "Get back to everything." Progress towards PT goals: Progressing toward goals    Frequency    Min 3X/week      PT Plan Current plan remains appropriate    Co-evaluation              AM-PAC PT "6 Clicks" Mobility   Outcome Measure  Help needed turning from your back to your side while in a flat bed without using bedrails?: None Help needed moving from lying on your back to sitting on the side of a flat bed without using  bedrails?: None Help needed moving to and from a bed to a chair (including a wheelchair)?: None Help needed standing up from a chair using your arms (e.g., wheelchair or bedside chair)?: None Help needed to walk in hospital room?: None Help needed climbing 3-5 steps with a railing? : A Little 6 Click Score: 23    End of Session Equipment Utilized During Treatment: Gait belt Activity Tolerance: Patient tolerated treatment well Patient left: with call bell/phone within reach;with nursing/sitter in room;in bed Nurse Communication: Mobility status PT Visit Diagnosis: Other abnormalities of gait and mobility (R26.89);Repeated falls (R29.6)     Time: 8338-2505 PT Time Calculation (min) (ACUTE ONLY): 17 min  Charges:  $Gait Training: 8-22 mins                     Felecia Shelling  PTA Acute  Rehabilitation Services Pager      [phone] Office      [phone]

## 2020-04-13 NOTE — Progress Notes (Signed)
Inpatient Diabetes Program Recommendations  AACE/ADA: New Consensus Statement on Inpatient Glycemic Control (2015)  Target Ranges:  Prepandial:   less than 140 mg/dL      Peak postprandial:   less than 180 mg/dL (1-2 hours)      Critically ill patients:  140 - 180 mg/dL   Lab Results  Component Value Date   GLUCAP 200 (H) 04/13/2020   HGBA1C 14.2 (H) 04/05/2020    Review of Glycemic Control Results for TICO, CROTTEAU (MRN [id number]) as of 04/13/2020 10:49  Ref. Range 04/12/2020 08:15 04/12/2020 11:35 04/12/2020 16:25 04/12/2020 21:18 04/13/2020 07:14  Glucose-Capillary Latest Ref Range: 70 - 99 mg/dL 696 (H) 295 (H) 284 (H) 334 (H) 200 (H)   Diabetes history: DM 2 Outpatient Diabetes medications: Levemir 40 units qhs, Humalog tid ssi, Metformin 1000 mg bid Current orders for Inpatient glycemic control:  Levemir 45 units qhs Novolog 0-15 units tid + hs Novolog 8 units tid meal coverage  Inpatient Diabetes Program Recommendations:    NOTE: meal coverage not given at supper with CBG 119 as ordered. Glucose increased into 300 range. No changes for now. Follow trends as hyperglycemia maybe residual from the absence of meal coverage last night.  Thanks,  Christena Deem RN, MSN, BC-ADM Inpatient Diabetes Coordinator Team Pager [phone] (8a-5p)

## 2020-04-13 NOTE — TOC Progression Note (Addendum)
Transition of Care [facility]) - Progression Note    Patient Details  Name: [name] MRN: [id number] Date of Birth: 1961/04/01  [facility] Care [facility]) CM/SW Contact  Geni Bers, RN Phone Number: 04/13/2020, 1:02 PM  Clinical Narrative:    Will continue to wait for a call back from [facility].    Expected Discharge Plan: Skilled Nursing Facility Barriers to Discharge: Continued Medical Work up  Expected Discharge Plan and Services Expected Discharge Plan: Skilled Nursing Facility In-house Referral: Clinical Social Work Discharge Planning Services: CM Consult Post Acute Care Choice: Skilled Nursing Facility Living arrangements for the past 2 months: Single Family Home Expected Discharge Date: 04/13/20                                     Social Determinants of Health (SDOH) Interventions    Readmission Risk Interventions No flowsheet data found.

## 2020-04-13 NOTE — TOC Progression Note (Signed)
Transition of Care [facility]) - Progression Note    Patient Details  Name: [name] MRN: [id number] Date of Birth: 03-Sep-1960  Transition of Care [facility]) CM/SW Contact  Geni Bers, RN Phone Number: 04/13/2020, 10:23 AM  Clinical Narrative:    Sandre Kitty declined pt related to drug abuse.    Expected Discharge Plan: Skilled Nursing Facility Barriers to Discharge: Continued Medical Work up  Expected Discharge Plan and Services Expected Discharge Plan: Skilled Nursing Facility In-house Referral: Clinical Social Work Discharge Planning Services: CM Consult Post Acute Care Choice: Skilled Nursing Facility Living arrangements for the past 2 months: Single Family Home Expected Discharge Date: 04/13/20                                     Social Determinants of Health (SDOH) Interventions    Readmission Risk Interventions No flowsheet data found.

## 2020-04-13 NOTE — Consult Note (Signed)
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 male patient admitted due to frequent falls and suicide attempt by putting a gun to his head, pulled the trigger but it jammed.  HPI:  Patient with history of diabetes mellitus type 2, hepatitis C, Schizoaffective disorder-depressed type, Cocaine use disorder, Stroke with left limbs weakness who was admitted to the hospital due to frequent falls and suicide attempt. Patient reports that life has been difficult for him since he had Stroke about 6 months ago due to excessive Cocaine use. He has become more depressed, stressed, easily overwhelmed and falling more frequently due to lower limbs weakness secondary to stroke. Today, he denies psychosis, delusions but reports ongoing depression and suicidal thoughts with no plan. Patient reports long history of cocaine use but has been trying to cut down after he has Stroke.   Patient originally had a referral out to [facility], however is in catchment area is [place], however he appears to be psychiatrically stable for discharge to the community.  Patient understands they are very limited resources at this time due to COVID outbreak.  He declines wanting to go to [facility] stating" I have a disability check out I will need to work.  I just need assistance finding a place.  There is a nice boarding home where I can go to on the [place] that has my own room and to  bathroom that available for [name] to use.  I just need help get in a place."  Patient reports improvement in his depressive mood secondary to feeling hopeful.  He also shows improved insight and judgment stating" cannot call the bank and use the phone now", once he was advised he was psychiatrically cleared and safety sitter will be discontinued.  We must note at this point he is here predominantly for housing purposes spies on admission he denies current suicidal thoughts plans or intent, history significant for malingering.   [name] is a 59 year old male with history significant for malingering, polysubstance abuse, unspecified personality disorder who presented with increased falls and suicide attempt by putting a gun to his head.  Patient is unable to deny any new triggers with the exception of his homelessness.  Of note he has been to the emergency department greater than 10 times in the past 90 days, 2 inpatient admissions to a psychiatric unit.    Patient is able to verbalize that he is had chronic suicidal ideations, and depression it is just worse now because he is homeless. "  If you are all discharge me with nowhere to go my depression may come back, and aint no telling what I will do.  I need a place to go". As notes above, the patient's statement that he will attempt suicide if discharged appears to be an expression of unmet needs (housing, pain management) that is representative of limited and often-maladaptive coping and skills, rather than an indicator of imminent risk of death. Patient is unable to identify social supports for the  team to contact to assist in the short-term, besides his need for housing.  Past Psychiatric History: as above  Risk to Self: Is patient at risk for suicide?: Yes Risk to Others:  denies Prior In[facility]:   [facility] [place] Prior Outpatient Therapy:  yes  Past Medical History:  Past Medical History:  Diagnosis Date  . Anxiety   .  Bipolar affective disorder (HCC)   . Bipolar disorder (HCC) 03/11/2012  . Depression   . Diabetes mellitus   . Diabetes mellitus (HCC) 03/11/2012  . Hepatitis 06/30/2013   Type C  . Hypertension   . Schizophrenia, schizo-affective (HCC)     Past Surgical History:  Procedure Laterality Date  . FINGER SURGERY    . WISDOM TOOTH EXTRACTION     Family History:  Family History  Problem Relation Age of Onset  . Diabetes Mother   . Bipolar disorder Sister    Family Psychiatric  History:  Social History:  Social History   Substance and Sexual Activity  Alcohol Use No     Social History   Substance and Sexual Activity  Drug Use Yes  . Types: Marijuana, Cocaine    Social History   Socioeconomic History  . Marital status: Single    Spouse name: Not on file  . Number of children: Not on file  . Years of education: Not on file  . Highest education level: Not on file  Occupational History  . Not on file  Tobacco Use  . Smoking status: Never Smoker  . Smokeless tobacco: Never Used  Vaping Use  . Vaping Use: Never used  Substance and Sexual Activity  . Alcohol use: No  . Drug use: Yes    Types: Marijuana, Cocaine  . Sexual activity: Yes    Birth control/protection: Condom    Comment: marijuana 25 days ago  Other Topics Concern  . Not on file  Social History Narrative  . Not on file   Social Determinants of Health   Financial Resource Strain:   . Difficulty of Paying Living Expenses: Not on file  Food Insecurity:   . Worried About Programme researcher, broadcasting/film/videounning Out of Food in the Last Year: Not on file  . Ran Out of Food in the Last Year: Not on file  Transportation Needs:   . Lack of Transportation (Medical): Not on file  . Lack of Transportation (Non-Medical): Not on file  Physical Activity:   . Days of Exercise per Week: Not on file  . Minutes of Exercise per Session: Not on file  Stress:   . Feeling of Stress : Not on file  Social Connections:   . Frequency of Communication with  Friends and Family: Not on file  . Frequency of Social Gatherings with Friends and Family: Not on file  . Attends Religious Services: Not on file  . Active Member of Clubs or Organizations: Not on file  . Attends BankerClub or Organization Meetings: Not on file  . Marital Status: Not on file   Additional Social History:    Allergies:   Allergies  Allergen Reactions  . Wellbutrin [Bupropion] Hives and Swelling    Labs:  Results for orders placed or performed during the hospital encounter of 04/05/20 (from the past 48 hour(s))  Glucose, capillary     Status: Abnormal   Collection Time: 04/11/20  8:01 PM  Result Value Ref Range   Glucose-Capillary 292 (H) 70 - 99 mg/dL    Comment: Glucose reference range applies only  to samples taken after fasting for at least 8 hours.   Comment 1 Notify RN   Glucose, capillary     Status: Abnormal   Collection Time: 04/12/20  8:15 AM  Result Value Ref Range   Glucose-Capillary 211 (H) 70 - 99 mg/dL    Comment: Glucose reference range applies only to samples taken after fasting for at least 8 hours.  Potassium     Status: None   Collection Time: 04/12/20  8:51 AM  Result Value Ref Range   Potassium 4.8 3.5 - 5.1 mmol/L    Comment: Performed at [facility], [address] [place], [place] [address]  Magnesium     Status: Abnormal   Collection Time: 04/12/20  8:51 AM  Result Value Ref Range   Magnesium 1.5 (L) 1.7 - 2.4 mg/dL    Comment: Performed at [facility], [address], [place], [place] [address]  Glucose, capillary     Status: Abnormal   Collection Time: 04/12/20 11:35 AM  Result Value Ref Range   Glucose-Capillary 144 (H) 70 - 99 mg/dL    Comment: Glucose reference range applies only to samples taken after fasting for at least 8 hours.  Glucose, capillary     Status: Abnormal   Collection Time: 04/12/20  4:25 PM  Result Value Ref Range   Glucose-Capillary 119 (H) 70 - 99 mg/dL    Comment:  Glucose reference range applies only to samples taken after fasting for at least 8 hours.  Glucose, capillary     Status: Abnormal   Collection Time: 04/12/20  9:18 PM  Result Value Ref Range   Glucose-Capillary 334 (H) 70 - 99 mg/dL    Comment: Glucose reference range applies only to samples taken after fasting for at least 8 hours.  Glucose, capillary     Status: Abnormal   Collection Time: 04/13/20  7:14 AM  Result Value Ref Range   Glucose-Capillary 200 (H) 70 - 99 mg/dL    Comment: Glucose reference range applies only to samples taken after fasting for at least 8 hours.  Glucose, capillary     Status: Abnormal   Collection Time: 04/13/20  5:05 PM  Result Value Ref Range   Glucose-Capillary 310 (H) 70 - 99 mg/dL    Comment: Glucose reference range applies only to samples taken after fasting for at least 8 hours.    Current Facility-Administered Medications  Medication Dose Route Frequency Provider Last Rate Last Admin  . acetaminophen (TYLENOL) tablet 650 mg  650 mg Oral Q4H PRN Madelyn Flavors A, MD   650 mg at 04/08/20 0905   Or  . acetaminophen (TYLENOL) 160 MG/5ML solution 650 mg  650 mg Per Tube Q4H PRN Madelyn Flavors A, MD       Or  . acetaminophen (TYLENOL) suppository 650 mg  650 mg Rectal Q4H PRN Smith, Rondell A, MD      . ARIPiprazole (ABILIFY) tablet 5 mg  5 mg Oral Daily Akintayo, Mojeed, MD   5 mg at 04/13/20 1014  . aspirin suppository 300 mg  300 mg Rectal Daily Smith, Rondell A, MD       Or  . aspirin tablet 325 mg  325 mg Oral Daily Katrinka Blazing, Rondell A, MD   325 mg at 04/13/20 1015  . atorvastatin (LIPITOR) tablet 80 mg  80 mg Oral Daily Katrinka Blazing, Rondell A, MD   80 mg at 04/13/20 1014  . clopidogrel (PLAVIX) tablet 75 mg  75 mg  Oral Daily Glade Lloyd, MD   75 mg at 04/13/20 1015  . enoxaparin (LOVENOX) injection 40 mg  40 mg Subcutaneous Q24H Madelyn Flavors A, MD   40 mg at 04/12/20 1802  . folic acid (FOLVITE) tablet 1 mg  1 mg Oral Daily Smith, Rondell A, MD   1  mg at 04/13/20 1014  . ibuprofen (ADVIL) tablet 400 mg  400 mg Oral Q6H PRN Glade Lloyd, MD   400 mg at 04/09/20 2117  . insulin aspart (novoLOG) injection 0-15 Units  0-15 Units Subcutaneous TID WC Madelyn Flavors A, MD   5 Units at 04/13/20 1328  . insulin aspart (novoLOG) injection 0-5 Units  0-5 Units Subcutaneous QHS Clydie Braun, MD   4 Units at 04/12/20 2124  . insulin aspart (novoLOG) injection 8 Units  8 Units Subcutaneous TID WC Glade Lloyd, MD   8 Units at 04/13/20 1329  . insulin detemir (LEVEMIR) injection 45 Units  45 Units Subcutaneous QHS Glade Lloyd, MD   45 Units at 04/12/20 2123  . multivitamin with minerals tablet 1 tablet  1 tablet Oral Daily Madelyn Flavors A, MD   1 tablet at 04/13/20 1015  . senna-docusate (Senokot-S) tablet 1 tablet  1 tablet Oral QHS PRN Madelyn Flavors A, MD      . thiamine tablet 100 mg  100 mg Oral Daily Smith, Rondell A, MD   100 mg at 04/13/20 1015   Or  . thiamine (B-1) injection 100 mg  100 mg Intravenous Daily Smith, Rondell A, MD      . traZODone (DESYREL) tablet 100 mg  100 mg Oral QHS Akintayo, Mojeed, MD   100 mg at 04/12/20 2123    Musculoskeletal: Strength & Muscle Tone: not tested Gait & Station: not tested Patient leans: N/A  Psychiatric Specialty Exam: Physical Exam Psychiatric:        Attention and Perception: Attention normal.        Mood and Affect: Mood is depressed. Affect is flat.        Speech: Speech normal.        Behavior: Behavior is cooperative.        Thought Content: Thought content includes suicidal ideation.        Cognition and Memory: Cognition normal.        Judgment: Judgment is impulsive.     Review of Systems  Constitutional: Positive for fatigue.  HENT: Negative.   Eyes: Negative.   Respiratory: Negative.   Cardiovascular: Negative.   Gastrointestinal: Negative.   Endocrine: Negative.   Neurological: Positive for weakness.  Psychiatric/Behavioral: Positive for dysphoric mood and  suicidal ideas.    Blood pressure 106/72, pulse 79, temperature 98.6 F (37 C), temperature source Oral, resp. rate 16, height  (1.753 m), weight 75.3 kg, SpO2 98 %.Body mass index is 24.51 kg/m.  General Appearance: Casual  Eye Contact:  Good  Speech:  Clear and Coherent and Normal Rate  Volume:  Normal  Mood:  Euthymic  Affect:  Appropriate and Congruent  Thought Process:  Coherent, Linear and Descriptions of Associations: Intact  Orientation:  Full (Time, Place, and Person)  Thought Content:  Logical  Suicidal Thoughts:  No  Homicidal Thoughts:  No  Memory:  Immediate;   Good Recent;   Good Remote;   Good  Judgement:  Intact  Insight:  Present  Psychomotor Activity:  Normal  Concentration:  Concentration: Fair and Attention Span: Fair  Recall:  Good  Fund of Knowledge:  Good  Language:  Good  Akathisia:  No  Handed:  Right  AIMS (if indicated):     Assets:  Communication Skills Desire for Improvement  ADL's:marginal following stroke  Cognition:  WNL  Sleep:        Treatment Plan Summary: 59 year old male who was admitted to the hospital due to frequent falls at home secondary to Stroke.  Upon admission patient also endorsed suicidal attempt/gesture in which he reports he placed a gun to his head.  At this time he is denying the statements.  Per chart review patient has a history of suicidal ideations and gestures, malingering, and antisocial traits.  At this time patient's depression and suicidal ideations appear to be complicated by his current homeless issues and substance abuse.  He does appear to be motivated to seek substance abuse treatment in addition to receiving intensive in-home services in a group home setting where he can receive ongoing help for his current issues listed above.  He denies suicidal ideations at this time.    Recommendations: Discharge summary has already been completed, unable to process new orders below.  Originally discontinue one-to-one  suicide sitter, however nursing staff is concerned regarding patient's safety and requests will remain in place.  Patient immediately left the unit and went to the vending machine, and needed redirection and observation from staff, 1 sitter was discontinued.  -Will recommend increase Abilify to 7.5 mg p.o. daily for mood stabilization, adjuvant therapy for depression -Continue trazodone 100 mg p.o. nightly for sleep  -Consider working closely with social work to facilitate referral or placement for group home.  Disposition: No evidence of imminent risk to self or others at present.   Patient does not meet criteria for psychiatric inpatient admission. Supportive therapy provided about ongoing stressors. Psych cleared for disposition to group home or adult living facility.   Maryagnes Amos, FNP 04/13/2020 5:31 PM

## 2020-04-13 NOTE — Plan of Care (Signed)
?  Problem: Clinical Measurements: ?Goal: Will remain free from infection ?Outcome: Progressing ?  ?

## 2020-04-13 NOTE — Discharge Summary (Signed)
Discharge Summary  [name] ZOX:[id number] DOB: 21-Feb-1961  PCP: System, Provider Not In  Admit date: 04/05/2020 Discharge date: 04/13/2020  Time spent: 35 minutes  Recommendations for Outpatient Follow-up:  1. Continue care at the psychiatric ward. 2. Follow up with neurology 3. Follow up with Psychiatry 4. Follow-up with your PCP 5. Take your medications as prescribed. 6. Continue PT OT with assistance and fall precautions.  Discharge Diagnoses:  Active Hospital Problems   Diagnosis Date Noted  . Schizoaffective disorder, bipolar type (HCC) 02/05/2016  . CVA (cerebral vascular accident) (HCC) 04/05/2020  . Frequent falls 04/05/2020  . 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 Suicidal ideation 02/06/2016  . Hyperlipidemia 01/18/2015  . Diabetes mellitus (HCC) 03/11/2012    Resolved Hospital Problems  No resolved problems to display.    Discharge Condition: Stable  Diet recommendation: Heart healthy carb modified diet.  Vitals:   04/12/20 2121 04/13/20 0532  BP: (!) 141/74 120/70  Pulse: 79 75  Resp: 20 18  Temp: 98.4 F (36.9 C) 98.4 F (36.9 C)  SpO2: 100% 100%    History of present illness:  59 year old male with history of diabetes mellitus type 2, hepatitis C, bipolar disorder presented with suicidal ideations, attempt, and falls. He reportedly attempted to place a gun to his head and pulled the trigger, but it was jammed. Patient did admit to recently using cocaine and alcohol 6 days prior to presentation. In the ED, MRI of the brain done on 04/05/2020 was significant for acute on chronic lacunar infarct at the dorsal right lentiform/posterior limb of the right internal capsule. Neurology/psychiatry were consulted.Neurology recommended aspirin and Plavix for 3 months along with Lipitor with outpatient follow-up with neurology. Psychiatry recommended inpatient psychiatric hospitalization. One-to-one sitter in place for patient's  own safety.  Currently on oral therapies and medically cleared for discharge.  Patient will be discharged to inpatient psychiatric facility once bed is available.   04/11/2020 reported left first great toe pain, x-ray complete of left foot was negative for acute abnormalities.  Currently his pain has resolved.  04/13/2020: Seen and examined.  No acute events overnight.  He has no new complaints.  Awaiting bed placement for inpatient psychiatric hospitalization.   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 Tolerating diet as per SLP recommendations. -LDL 171, goal LDL less than 70. Hemoglobin A1c 14.2, goal A1c less than 7.0. -Carotid ultrasound showed bilateral 1 to 39% ICA stenosis. 2D echo showed EF of 55 to 60% with grade 2 diastolic dysfunction -Neurology has signed off. 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 Outpatient follow-up  Suicidal attempt/schizoaffective /bipolar disorder Recent suicidal attempt with a gun Continue 1-to-1 sitter Plan to dc to psych ward Continue Abilify and trazodone as recommended by psych. Awaiting bed placement for inpatient psychiatric  hospitalization  Resolved left great toe pain Reports left great toe pain.  Denies prior hx of gout.  X-ray negative for acute abnormalities. Analgesic as needed.  Twomm vascular protrusion arising fromRICA/CCAbifurcation-infundibulum versus small aneurysm. Outpatient follow-up with repeat CTA in a year as per neurology Follow-up with neurology outpatient.  Diabetes mellitus type 2 uncontrolled with hyperglycemia -A1c 14.2. Continue Levemir along with Humalog. Carb modified diet.  Resume Metformin at discharge.  Hold glipizide.  Outpatient follow-up with PCP  Recurrent falls/ambulatory dysfunction -PT recommends SNF.He will likely need to go to SNF from inpatient psychiatric hospital.  Continue PT OT with assistance and fall precautions  Hyperlipidemia LDL 171, goal less than 70. High intensity statin, Lipitor 80 mg daily. Follow-up with your PCP  Polysubstance abuse -Longstanding history of cocaine/alcohol abuse.  Urine drug screen was positive for cocaine.  Polysubstance cessation counseling.     Code Status: Full  Family Communication: None at bedside     Consultants:  Psychiatry  Procedures:  None  Antimicrobials:  None  DVT prophylaxis:  None   Disposition Plan:   Status is: Inpatient    Dispo: The patient is from: Home  Anticipated d/c is to: Psych ward   Anticipated d/c date is: 04/13/20  Patient currently medically stable for discharge.   Discharge Exam: BP 120/70 (BP Location: Right Arm)   Pulse 75   Temp 98.4 F (36.9 C) (Oral)   Resp 18   Ht 5\' 9"  (1.753 m)   Wt 75.3 kg   SpO2 100%   BMI 24.51 kg/m  . General: 59 y.o. year-old male well developed well nourished in no acute distress.  Alert and oriented x3. . Cardiovascular: Regular rate and rhythm with no rubs or gallops.  No thyromegaly or JVD noted.   46 Respiratory: Clear to auscultation with no  wheezes or rales. Good inspiratory effort. . Abdomen: Soft nontender nondistended with normal bowel sounds x4 quadrants. . Musculoskeletal: No lower extremity edema. 2/4 pulses in all 4 extremities. Marland Kitchen Psychiatry: Mood is appropriate for condition and setting  Discharge Instructions You were cared for by a hospitalist during your hospital stay. If you have any questions about your discharge medications or the care you received while you were in the hospital after you are discharged, you can call the unit and asked to speak with the hospitalist on call if the hospitalist that took care of you is not available. Once you are discharged, your primary care physician will handle any further medical issues. Please note that NO REFILLS for any discharge medications will be authorized once you are discharged, as it is imperative that you return to your primary care physician (or establish a relationship with a primary care physician if you do not have one) for your aftercare needs so that they can reassess your need for medications and monitor your lab values.  Discharge Instructions    Ambulatory referral to Neurology   Complete by: As directed    An appointment is requested in approximately: 2-3 weeks for follow-up of stroke   Diet - low sodium heart healthy   Complete by: As directed    Diet Carb Modified   Complete by: As directed    Increase activity slowly   Complete by: As directed      Allergies as of 04/13/2020      Reactions   Wellbutrin [bupropion] Hives, Swelling      Medication List    STOP taking these medications   FLUoxetine 40 MG capsule  Commonly known as: PROZAC   glipiZIDE 10 MG tablet Commonly known as: GLUCOTROL   hydrOXYzine 25 MG tablet Commonly known as: ATARAX/VISTARIL   QUEtiapine 200 MG tablet Commonly known as: SEROQUEL   zolpidem 5 MG tablet Commonly known as: AMBIEN     TAKE these medications   ARIPiprazole 5 MG tablet Commonly known as: ABILIFY Take  1 tablet (5 mg total) by mouth daily. What changed:   medication strength  how much to take   aspirin 325 MG tablet Take 1 tablet (325 mg total) by mouth daily.   atorvastatin 80 MG tablet Commonly known as: LIPITOR Take 1 tablet (80 mg total) by mouth daily.   clopidogrel 75 MG tablet Commonly known as: PLAVIX Take 1 tablet (75 mg total) by mouth daily.   folic acid 1 MG tablet Commonly known as: FOLVITE Take 1 tablet (1 mg total) by mouth daily.   HumaLOG KwikPen 100 UNIT/ML KwikPen Generic drug: insulin lispro Inject 0-0.1 mLs (0-10 Units total) into the skin 3 (three) times daily with meals.   insulin detemir 100 UNIT/ML injection Commonly known as: LEVEMIR Inject 0.45 mLs (45 Units total) into the skin at bedtime. What changed:   how much to take  additional instructions   metFORMIN 1000 MG tablet Commonly known as: GLUCOPHAGE Take 1 tablet (1,000 mg total) by mouth 2 (two) times daily with a meal.   multivitamin with minerals Tabs tablet Take 1 tablet by mouth daily.   thiamine 100 MG tablet Take 1 tablet (100 mg total) by mouth daily.   traZODone 100 MG tablet Commonly known as: DESYREL Take 1 tablet (100 mg total) by mouth at bedtime.            Durable Medical Equipment  (From admission, onward)         Start     Ordered   04/10/20 1439  For home use only DME 4 wheeled rolling walker with seat  Once       Question:  Patient needs a walker to treat with the following condition  Answer:  Fear for personal safety   04/10/20 1439         Allergies  Allergen Reactions  . Wellbutrin [Bupropion] Hives and Swelling    Follow-up Information    Milon DikesArora, Ashish, MD. Call in 1 day(s).   Specialty: Neurology Why: Please call for a post hospital follow-up appointment. Contact information: [address] [place] [id number] [phone]        [facility]. Call in 1 day(s).   Why: Please call for a post  hospital follow-up appointment. Contact information: [address] [place] [id number] [phone]               The results of significant diagnostics from this hospitalization (including imaging, microbiology, ancillary and laboratory) are listed below for reference.    Significant Diagnostic Studies: CT ANGIO HEAD W OR WO CONTRAST  Result Date: 04/06/2020 CLINICAL DATA:  Stroke/TIA, assess extracranial arteries, right internal capsule stroke. EXAM: CT ANGIOGRAPHY HEAD AND NECK TECHNIQUE: Multidetector CT imaging of the head and neck was performed using the standard protocol during bolus administration of intravenous contrast. Multiplanar CT image reconstructions and MIPs were obtained to evaluate the vascular anatomy. Carotid stenosis measurements (when applicable) are obtained utilizing NASCET criteria, using the distal internal carotid diameter as the denominator. CONTRAST:  100mL OMNIPAQUE IOHEXOL 350 MG/ML SOLN COMPARISON:  Brain MRI performed earlier the  same day 04/05/2020, prior head CT examinations 04/05/2020 and earlier. FINDINGS: CT HEAD FINDINGS Brain: A punctate acute infarct within the right basal ganglia/posterior limb of right internal capsule was better appreciated on the brain MRI performed earlier the same day. Advanced ill-defined hypoattenuation within the cerebral white matter consistent with chronic small vessel ischemic disease. Redemonstrated chronic lacunar infarcts within the bilateral corona radiata/basal ganglia and right thalamus. Also redemonstrated is a small chronic infarct within the left cerebellar hemisphere. There is no acute intracranial hemorrhage. No demarcated cortical infarct is identified. No extra-axial fluid collection. No evidence of intracranial mass. No midline shift. Vascular: Reported below. Skull: Normal. Negative for fracture or focal lesion. Sinuses: Minimal ethmoid and maxillary sinus mucosal thickening. No significant  mastoid effusion. Orbits: No acute finding. Review of the MIP images confirms the above findings CTA NECK FINDINGS Aortic arch: Common origin of the innominate and left common carotid arteries. Atherosclerotic plaque within the visualized aortic arch. No hemodynamically significant innominate or proximal subclavian artery stenosis. Right carotid system: CCA and ICA patent within the neck without significant stenosis (50% or greater). Minimal mixed plaque within the prox ICA. Left carotid system: CCA and ICA patent within the neck without significant stenosis (50% or greater). Mild mixed plaque within the carotid bifurcation and proximal ICA. This includes a small focus of ulcerated plaque along the posterior aspect of the carotid bulb (series 14, image 131) Vertebral arteries: Patent bilaterally without significant stenosis. The left vertebral artery is slightly dominant. Skeleton: No acute bony abnormality or aggressive osseous lesion. Cervical spondylosis with multilevel disc space narrowing, posterior disc osteophytes, uncovertebral and facet hypertrophy. Additionally, there are multilevel prominent ventral osteophytes most notably at the C5-C6 and C6-C7 levels. Other neck: No neck mass or cervical lymphadenopathy. Upper chest: No consolidation within the imaged lung apices. Review of the MIP images confirms the above findings CTA HEAD FINDINGS Anterior circulation: The intracranial internal carotid arteries are patent. Minimal calcified plaque within the intracranial left ICA without stenosis. The M1 middle cerebral arteries are patent without significant stenosis. Mild-to-moderate segmental narrowing of the proximal to mid M1 left MCA (series 15, image 50). No M2 proximal branch occlusion is identified. There are high-grade focal stenoses within multiple distal M2 left MCA branch vessels (for instance as seen on series 17, image 28). The anterior cerebral arteries are patent. Moderate/severe segmental stenosis  within the A2 right anterior cerebral artery (series 17, image 22). 2 mm inferiorly projecting vascular protrusion arising from the paraclinoid right ICA, which may reflect an infundibulum or small aneurysm (series 14, image 87). Posterior circulation: The intracranial vertebral arteries are patent. The basilar artery is patent. Fetal origin left posterior cerebral artery. The posterior cerebral arteries are patent bilaterally. There is a moderate focal stenosis within the P1 right PCA. Severe focal stenosis within the proximal P2 right PCA. Moderate/severe focal stenoses within the left posterior communicating artery and P2 left PCA (series 15, image 50). The right posterior communicating artery is hypoplastic or absent. Venous sinuses: Within limitations of contrast timing, no convincing thrombus. Anatomic variants: As described Review of the MIP images confirms the above findings IMPRESSION: CT head: 1. A punctate acute infarct within the right basal ganglia/posterior limb of right internal capsule was better appreciated on the brain MRI performed earlier the same day. 2. 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 3. Redemonstrated small chronic infarct within the left cerebellum.  CTA neck: The common carotid, internal carotid and vertebral arteries are patent within the neck without hemodynamically significant stenosis. Mild calcified plaque within the carotid bifurcations/proximal ICAs bilaterally. This includes a focus of ulcerated plaque within the posterior aspect of the left carotid bulb. CTA head: 1. No intracranial large vessel occlusion. 2. Intracranial atherosclerotic disease with multifocal stenoses, most notably as follows. 3. Mild-to-moderate segmental narrowing of the proximal to mid M1 left MCA. 4. High-grade focal stenoses within multiple distal M2 left  MCA branch vessels. 5. Moderate/severe segmental stenosis within the A2 right anterior cerebral artery. 6. Moderate/severe focal stenosis within the P1 right PCA 7. Severe focal stenosis within the P2 right PCA. 8. The left PCA is fetal in origin. Moderate/severe focal stenoses within the left posterior communicating artery and P2 left PCA. 9. 2 mm vascular protrusion arising from the paraclinoid right ICA which may reflect an infundibulum or small aneurysm. Electronically Signed   By: Jackey Loge DO   On: 04/06/2020 14:34   DG Chest 2 View  Result Date: 04/04/2020 CLINICAL DATA:  Hyperglycemic, suicidal thoughts X 3 days. History of hypertension, diabetes, schizoaffective and bipolar disorder, hepatitis. EXAM: CHEST - 2 VIEW COMPARISON:  Chest x-ray 02/28/2016. chest x-ray 02/03/2013 FINDINGS: The heart size and mediastinal contours are within normal limits. Both lungs are clear. No acute osseous finding. Multilevel degenerative changes of the spine with chronic mild anterior wedge shaped compression deformity of a midthoracic body IMPRESSION: No active cardiopulmonary disease. Electronically Signed   By: Tish Frederickson M.D.   On: 04/04/2020 10:48   CT Head Wo Contrast  Result Date: 04/05/2020 CLINICAL DATA:  Trauma EXAM: CT HEAD WITHOUT CONTRAST CT CERVICAL SPINE WITHOUT CONTRAST TECHNIQUE: Multidetector CT imaging of the head and cervical spine was performed following the standard protocol without intravenous contrast. Multiplanar CT image reconstructions of the cervical spine were also generated. COMPARISON:  01/18/2015 head CT. FINDINGS: CT HEAD FINDINGS Brain: Age indeterminate focal hypodensities involving the left basal ganglia (3:14) and superior left cerebellum (7:46, new since prior exam. Additional scattered and confluent hypodense foci involving the periventricular and subcortical white matter are nonspecific however may reflect chronic microvascular ischemic changes. No mass lesion. No midline  shift, ventriculomegaly or extra-axial fluid collection. Mild cerebral atrophy with ex vacuo dilatation. Vascular: No hyperdense vessel or unexpected calcification. Skull: Negative for fracture or focal lesion. Sinuses/Orbits: Normal orbits. Clear paranasal sinuses. No mastoid effusion. Other: None. CT CERVICAL SPINE FINDINGS Alignment: Straightening of lordosis.  No listhesis. Skull base and vertebrae: No acute fracture. No primary bone lesion or focal pathologic process. Soft tissues and spinal canal: No prevertebral fluid or swelling. No visible canal hematoma. Disc levels: Multilevel spondylosis most prominent at the C5-7 levels. Patent bony spinal canal. Multilevel mild-to-moderate bony neural foraminal narrowing secondary to uncovertebral and facet hypertrophy. Upper chest: Clear lung apices. Other: None. IMPRESSION: Focal hypodensities involving the left basal ganglia and superior left cerebellum are age indeterminate. Consider MRI brain to exclude subacute insults. Mild cerebral atrophy and chronic microvascular ischemic changes. No acute fracture or traumatic listhesis.  Multilevel spondylosis. These results were called by telephone at the time of interpretation on 04/05/2020 at 10:51 am to provider [facility] , who verbally acknowledged these results. Electronically Signed   By: Stana Bunting M.D.   On: 04/05/2020 11:02   CT ANGIO NECK W OR WO CONTRAST  Result Date: 04/06/2020 CLINICAL DATA:  Stroke/TIA, assess extracranial arteries, right internal capsule stroke. EXAM: CT ANGIOGRAPHY HEAD AND NECK TECHNIQUE:  Multidetector CT imaging of the head and neck was performed using the standard protocol during bolus administration of intravenous contrast. Multiplanar CT image reconstructions and MIPs were obtained to evaluate the vascular anatomy. Carotid stenosis measurements (when applicable) are obtained utilizing NASCET criteria, using the distal internal carotid diameter as the denominator.  CONTRAST:  OMNIPAQUE IOHEXOL 350 MG/ML SOLN COMPARISON:  Brain MRI performed earlier the same day 04/05/2020, prior head CT examinations 04/05/2020 and earlier. FINDINGS: CT HEAD FINDINGS Brain: A punctate acute infarct within the right basal ganglia/posterior limb of right internal capsule was better appreciated on the brain MRI performed earlier the same day. Advanced ill-defined hypoattenuation within the cerebral white matter consistent with chronic small vessel ischemic disease. Redemonstrated chronic lacunar infarcts within the bilateral corona radiata/basal ganglia and right thalamus. Also redemonstrated is a small chronic infarct within the left cerebellar hemisphere. There is no acute intracranial hemorrhage. No demarcated cortical infarct is identified. No extra-axial fluid collection. No evidence of intracranial mass. No midline shift. Vascular: Reported below. Skull: Normal. Negative for fracture or focal lesion. Sinuses: Minimal ethmoid and maxillary sinus mucosal thickening. No significant mastoid effusion. Orbits: No acute finding. Review of the MIP images confirms the above findings CTA NECK FINDINGS Aortic arch: Common origin of the innominate and left common carotid arteries. Atherosclerotic plaque within the visualized aortic arch. No hemodynamically significant innominate or proximal subclavian artery stenosis. Right carotid system: CCA and ICA patent within the neck without significant stenosis (50% or greater). Minimal mixed plaque within the prox ICA. Left carotid system: CCA and ICA patent within the neck without significant stenosis (50% or greater). Mild mixed plaque within the carotid bifurcation and proximal ICA. This includes a small focus of ulcerated plaque along the posterior aspect of the carotid bulb (series 14, image 131) Vertebral arteries: Patent bilaterally without significant stenosis. The left vertebral artery is slightly dominant. Skeleton: No acute bony abnormality or  aggressive osseous lesion. Cervical spondylosis with multilevel disc space narrowing, posterior disc osteophytes, uncovertebral and facet hypertrophy. Additionally, there are multilevel prominent ventral osteophytes most notably at the C5-C6 and C6-C7 levels. Other neck: No neck mass or cervical lymphadenopathy. Upper chest: No consolidation within the imaged lung apices. Review of the MIP images confirms the above findings CTA HEAD FINDINGS Anterior circulation: The intracranial internal carotid arteries are patent. Minimal calcified plaque within the intracranial left ICA without stenosis. The M1 middle cerebral arteries are patent without significant stenosis. Mild-to-moderate segmental narrowing of the proximal to mid M1 left MCA (series 15, image 50). No M2 proximal branch occlusion is identified. There are high-grade focal stenoses within multiple distal M2 left MCA branch vessels (for instance as seen on series 17, image 28). The anterior cerebral arteries are patent. Moderate/severe segmental stenosis within the A2 right anterior cerebral artery (series 17, image 22). 2 mm inferiorly projecting vascular protrusion arising from the paraclinoid right ICA, which may reflect an infundibulum or small aneurysm (series 14, image 87). Posterior circulation: The intracranial vertebral arteries are patent. The basilar artery is patent. Fetal origin left posterior cerebral artery. The posterior cerebral arteries are patent bilaterally. There is a moderate focal stenosis within the P1 right PCA. Severe focal stenosis within the proximal P2 right PCA. Moderate/severe focal stenoses within the left posterior communicating artery and P2 left PCA (series 15, image 50). The right posterior communicating artery is hypoplastic or absent. Venous sinuses: Within limitations of contrast timing, no convincing thrombus. Anatomic variants: As described Review of the MIP images confirms  the above findings IMPRESSION: CT head: 1. A  punctate acute infarct within the right basal ganglia/posterior limb of right internal capsule was better appreciated on the brain MRI performed earlier the same day. 2. 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 3. Redemonstrated small chronic infarct within the left cerebellum. CTA neck: The common carotid, internal carotid and vertebral arteries are patent within the neck without hemodynamically significant stenosis. Mild calcified plaque within the carotid bifurcations/proximal ICAs bilaterally. This includes a focus of ulcerated plaque within the posterior aspect of the left carotid bulb. CTA head: 1. No intracranial large vessel occlusion. 2. Intracranial atherosclerotic disease with multifocal stenoses, most notably as follows. 3. Mild-to-moderate segmental narrowing of the proximal to mid M1 left MCA. 4. High-grade focal stenoses within multiple distal M2 left MCA branch vessels. 5. Moderate/severe segmental stenosis within the A2 right anterior cerebral artery. 6. Moderate/severe focal stenosis within the P1 right PCA 7. Severe focal stenosis within the P2 right PCA. 8. The left PCA is fetal in origin. Moderate/severe focal stenoses within the left posterior communicating artery and P2 left PCA. 9. 2 mm vascular protrusion arising from the paraclinoid right ICA which may reflect an infundibulum or small aneurysm. Electronically Signed   By: Jackey Loge DO   On: 04/06/2020 14:34   CT Cervical Spine Wo Contrast  Result Date: 04/05/2020 CLINICAL DATA:  Trauma EXAM: CT HEAD WITHOUT CONTRAST CT CERVICAL SPINE WITHOUT CONTRAST TECHNIQUE: Multidetector CT imaging of the head and cervical spine was performed following the standard protocol without intravenous contrast. Multiplanar CT image reconstructions of the cervical spine were also generated. COMPARISON:   01/18/2015 head CT. FINDINGS: CT HEAD FINDINGS Brain: Age indeterminate focal hypodensities involving the left basal ganglia (3:14) and superior left cerebellum (7:46, new since prior exam. Additional scattered and confluent hypodense foci involving the periventricular and subcortical white matter are nonspecific however may reflect chronic microvascular ischemic changes. No mass lesion. No midline shift, ventriculomegaly or extra-axial fluid collection. Mild cerebral atrophy with ex vacuo dilatation. Vascular: No hyperdense vessel or unexpected calcification. Skull: Negative for fracture or focal lesion. Sinuses/Orbits: Normal orbits. Clear paranasal sinuses. No mastoid effusion. Other: None. CT CERVICAL SPINE FINDINGS Alignment: Straightening of lordosis.  No listhesis. Skull base and vertebrae: No acute fracture. No primary bone lesion or focal pathologic process. Soft tissues and spinal canal: No prevertebral fluid or swelling. No visible canal hematoma. Disc levels: Multilevel spondylosis most prominent at the C5-7 levels. Patent bony spinal canal. Multilevel mild-to-moderate bony neural foraminal narrowing secondary to uncovertebral and facet hypertrophy. Upper chest: Clear lung apices. Other: None. IMPRESSION: Focal hypodensities involving the left basal ganglia and superior left cerebellum are age indeterminate. Consider MRI brain to exclude subacute insults. Mild cerebral atrophy and chronic microvascular ischemic changes. No acute fracture or traumatic listhesis.  Multilevel spondylosis. These results were called by telephone at the time of interpretation on 04/05/2020 at 10:51 am to provider [facility] , who verbally acknowledged these results. Electronically Signed   By: Stana Bunting M.D.   On: 04/05/2020 11:02   MR Brain Wo Contrast (neuro protocol)  Result Date: 04/05/2020 CLINICAL DATA:  59 year old male status post trauma, neurologic deficit with left side weakness. Age indeterminate  ischemia in the left basal ganglia and left cerebellum on plain head CT earlier today. EXAM: MRI HEAD WITHOUT CONTRAST TECHNIQUE: Multiplanar, multiecho pulse sequences of the brain and surrounding structures  were obtained without intravenous contrast. COMPARISON:  Head CT 1029 hours today. FINDINGS: Brain: Questionable punctate focus of restricted diffusion at the dorsal right lentiform near the posterior limb of the right internal capsule, occurring in an area of chronic small vessel ischemia and encephalomalacia (series 5, image 80 and series 8, image 15). No other restricted diffusion. 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 Moderate patchy T2 hyperintensity in the right paracentral pons also most resembles chronic small-vessel ischemia. No cortical encephalomalacia or definite chronic blood products. No midline shift, mass effect, evidence of mass lesion, ventriculomegaly, extra-axial collection or acute intracranial hemorrhage. Cervicomedullary junction and pituitary are within normal limits. Vascular: Major intracranial vascular flow voids are preserved. Skull and upper cervical spine: Negative for age visible cervical spine, bone marrow signal. Sinuses/Orbits: Negative orbits. Paranasal sinuses and mastoids are stable and well pneumatized. Other: There is a small volume of fluid trapped in the left petrous apex (series 10, image 12) with facilitated diffusion and no complicating features. Otherwise grossly normal visible internal auditory structures. Scalp and face appear negative. IMPRESSION: 1. Suspect punctate acute on chronic lacunar infarct at the dorsal right lentiform / posterior limb of the right internal capsule. No associated hemorrhage or mass effect. 2. Underlying advanced chronic  small vessel disease, with chronic ischemia in the left basal ganglia and the left cerebellum SCA territory corresponding to head CT findings earlier today. Electronically Signed   By: Odessa Fleming M.D.   On: 04/05/2020 13:44   DG Knee Complete 4 Views Left  Result Date: 04/05/2020 CLINICAL DATA:  Left knee pain after fall. EXAM: LEFT KNEE - COMPLETE 4+ VIEW COMPARISON:  None. FINDINGS: No evidence of fracture, dislocation, or joint effusion. No evidence of arthropathy or other focal bone abnormality. Soft tissues are unremarkable. IMPRESSION: Negative. Electronically Signed   By: Lupita Raider M.D.   On: 04/05/2020 11:03   DG Foot Complete Left  Result Date: 04/11/2020 CLINICAL DATA:  LEFT great toe pain, no injury EXAM: LEFT FOOT - COMPLETE 3+ VIEW COMPARISON:  None FINDINGS: Osseous demineralization. Joint spaces preserved. No acute fracture, dislocation, or bone destruction. IMPRESSION: No acute abnormalities. Electronically Signed   By: Ulyses Southward M.D.   On: 04/11/2020 14:36   ECHOCARDIOGRAM COMPLETE  Result Date: 04/06/2020    ECHOCARDIOGRAM REPORT   Patient Name:   LERONE ONDER Date of Exam: 04/06/2020 Medical Rec #:  [id number]      Height:       69.0 in Accession #:    [id number]     Weight:       175.0 lb Date of Birth:  1960/08/01       BSA:          1.952 m Patient Age:    59 years       BP:           125/70 mmHg Patient Gender: M              HR:           68 bpm. Exam Location:  Inpatient Procedure: 2D Echo, Cardiac Doppler, Color Doppler and Strain Analysis Indications:    Stroke  History:        Patient has prior history of Echocardiogram examinations, most  recent 02/29/2016. Stroke, Signs/Symptoms:Chest Pain; Risk                 Factors:Diabetes and Dyslipidemia. Cocaine use.  Sonographer:    Sheralyn Boatman RDCS Referring Phys: [id number] RONDELL A SMITH  Sonographer Comments: Technically difficult study due to poor echo windows. Global longitudinal strain was attempted. IMPRESSIONS   1. Left ventricular ejection fraction, by estimation, is 55 to 60%. The left ventricle has normal function. The left ventricle has no regional wall motion abnormalities. There is moderate left ventricular hypertrophy. Left ventricular diastolic parameters are consistent with Grade II diastolic dysfunction (pseudonormalization). The average left ventricular global longitudinal strain is -18.1 %.  2. Right ventricular systolic function is normal. The right ventricular size is normal.  3. The mitral valve is grossly normal. Trivial mitral valve regurgitation.  4. The aortic valve is grossly normal. Aortic valve regurgitation is not visualized. No aortic stenosis is present. FINDINGS  Left Ventricle: Left ventricular ejection fraction, by estimation, is 55 to 60%. The left ventricle has normal function. The left ventricle has no regional wall motion abnormalities. The average left ventricular global longitudinal strain is -18.1 %. The left ventricular internal cavity size was normal in size. There is moderate left ventricular hypertrophy. Left ventricular diastolic parameters are consistent with Grade II diastolic dysfunction (pseudonormalization). Right Ventricle: The right ventricular size is normal. No increase in right ventricular wall thickness. Right ventricular systolic function is normal. Left Atrium: Left atrial size was normal in size. Right Atrium: Right atrial size was normal in size. Pericardium: There is no evidence of pericardial effusion. Mitral Valve: The mitral valve is grossly normal. Trivial mitral valve regurgitation. Tricuspid Valve: The tricuspid valve is grossly normal. Tricuspid valve regurgitation is trivial. Aortic Valve: The aortic valve is grossly normal. Aortic valve regurgitation is not visualized. No aortic stenosis is present. Pulmonic Valve: The pulmonic valve was normal in structure. Pulmonic valve regurgitation is not visualized. Aorta: The aortic root and ascending aorta are  structurally normal, with no evidence of dilitation. IAS/Shunts: The atrial septum is grossly normal.  LEFT VENTRICLE PLAX 2D LVIDd:         3.60 cm     Diastology LVIDs:         2.50 cm     LV e' medial:    7.07 cm/s LV PW:         1.70 cm     LV E/e' medial:  9.5 LV IVS:        1.30 cm     LV e' lateral:   10.60 cm/s LVOT diam:     1.90 cm     LV E/e' lateral: 6.3 LV SV:         45 LV SV Index:   23          2D Longitudinal Strain LVOT Area:     2.84 cm    2D Strain GLS Avg:     -18.1 %  LV Volumes (MOD) LV vol d, MOD A2C: 47.7 ml LV vol d, MOD A4C: 71.2 ml LV vol s, MOD A2C: 23.2 ml LV vol s, MOD A4C: 29.2 ml LV SV MOD A2C:     24.5 ml LV SV MOD A4C:     71.2 ml LV SV MOD BP:      32.5 ml RIGHT VENTRICLE             IVC RV S prime:     10.90 cm/s  IVC diam: 1.80 cm  TAPSE (M-mode): 1.9 cm LEFT ATRIUM             Index      RIGHT ATRIUM           Index LA diam:        4.00 cm 2.05 cm/m RA Area:     11.70 cm LA Vol (A2C):   13.0 ml 6.66 ml/m RA Volume:   25.20 ml  12.91 ml/m LA Vol (A4C):   18.2 ml 9.32 ml/m LA Biplane Vol: 16.8 ml 8.61 ml/m  AORTIC VALVE LVOT Vmax:   89.20 cm/s LVOT Vmean:  63.900 cm/s LVOT VTI:    0.159 m  AORTA Ao Root diam: 3.30 cm Ao Asc diam:  3.20 cm MITRAL VALVE MV Area (PHT): 2.73 cm    SHUNTS MV Decel Time: 278 msec    Systemic VTI:  0.16 m MV E velocity: 67.20 cm/s  Systemic Diam: 1.90 cm MV A velocity: 50.00 cm/s MV E/A ratio:  1.34 Kristeen Miss MD Electronically signed by Kristeen Miss MD Signature Date/Time: 04/06/2020/12:47:27 PM    Final    VAS US CAROTID  Result Date: 04/06/2020 Carotid Arterial Duplex Study Indications:       CVA. Risk Factors:      Prior CVA. Comparison Study:  No prior studies. Performing Technologist: Chanda Busing RVT  Examination Guidelines: A complete evaluation includes B-mode imaging, spectral Doppler, color Doppler, and power Doppler as needed of all accessible portions of each vessel. Bilateral testing is considered an integral part of a  complete examination. Limited examinations for reoccurring indications may be performed as noted.  Right Carotid Findings: +----------+--------+--------+--------+-----------------------+--------+           PSV cm/sEDV cm/sStenosisPlaque Description     Comments +----------+--------+--------+--------+-----------------------+--------+ CCA Prox  85      17              smooth and heterogenous         +----------+--------+--------+--------+-----------------------+--------+ CCA Distal42      14              smooth and heterogenous         +----------+--------+--------+--------+-----------------------+--------+ ICA Prox  38      16              smooth and heterogenous         +----------+--------+--------+--------+-----------------------+--------+ ICA Distal58      21                                              +----------+--------+--------+--------+-----------------------+--------+ ECA       87      16                                              +----------+--------+--------+--------+-----------------------+--------+ +----------+--------+-------+--------+-------------------+           PSV cm/sEDV cmsDescribeArm Pressure (mmHG) +----------+--------+-------+--------+-------------------+ [id number]                                        +----------+--------+-------+--------+-------------------+ +---------+--------+--+--------+--+---------+ VertebralPSV cm/s32EDV cm/s13Antegrade +---------+--------+--+--------+--+---------+  Left Carotid Findings: +----------+--------+--------+--------+-----------------------+--------+           PSV cm/sEDV cm/sStenosisPlaque Description  Comments +----------+--------+--------+--------+-----------------------+--------+ CCA Prox  75      15              smooth and heterogenous         +----------+--------+--------+--------+-----------------------+--------+ CCA Distal31      8               smooth and  heterogenous         +----------+--------+--------+--------+-----------------------+--------+ ICA Prox  37      14              smooth and heterogenous         +----------+--------+--------+--------+-----------------------+--------+ ICA Distal84      36                                              +----------+--------+--------+--------+-----------------------+--------+ ECA       77      18                                              +----------+--------+--------+--------+-----------------------+--------+ +----------+--------+--------+--------+-------------------+           PSV cm/sEDV cm/sDescribeArm Pressure (mmHG) +----------+--------+--------+--------+-------------------+ [id number]                                          +----------+--------+--------+--------+-------------------+ +---------+--------+--+--------+--+---------+ VertebralPSV cm/s46EDV cm/s14Antegrade +---------+--------+--+--------+--+---------+   Summary: Right Carotid: Velocities in the right ICA are consistent with a 1-39% stenosis. Left Carotid: Velocities in the left ICA are consistent with a 1-39% stenosis. Vertebrals: Bilateral vertebral arteries demonstrate antegrade flow. *See table(s) above for measurements and observations.  Electronically signed by Sherald Hess MD on 04/06/2020 at 4:37:52 PM.    Final     Microbiology: Recent Results (from the past 240 hour(s))  SARS Coronavirus 2 by RT PCR (hospital order, performed in [facility] hospital lab) Nasopharyngeal Nasopharyngeal Swab     Status: None   Collection Time: 04/04/20 10:12 AM   Specimen: Nasopharyngeal Swab  Result Value Ref Range Status   SARS Coronavirus 2 NEGATIVE NEGATIVE Final    Comment: (NOTE) SARS-CoV-2 target nucleic acids are NOT DETECTED.  The SARS-CoV-2 RNA is generally detectable in upper and lower respiratory specimens during the acute phase of infection. The lowest concentration of SARS-CoV-2 viral  copies this assay can detect is 250 copies / mL. A negative result does not preclude SARS-CoV-2 infection and should not be used as the sole basis for treatment or other patient management decisions.  A negative result may occur with improper specimen collection / handling, submission of specimen other than nasopharyngeal swab, presence of viral mutation(s) within the areas targeted by this assay, and inadequate number of viral copies (<250 copies / mL). A negative result must be combined with clinical observations, patient history, and epidemiological information.  Fact Sheet for Patients:   BoilerBrush.com.cy  Fact Sheet for Healthcare Providers: https://pope.com/  This test is not yet approved or  cleared by the [place] FDA and has been authorized for detection and/or diagnosis of SARS-CoV-2 by FDA under an Emergency Use Authorization (EUA).  This EUA will remain in effect (meaning this test can be used) for the duration of the COVID-19  declaration under Section 564(b)(1) of the Act, 21 U.S.C. section 360bbb-3(b)(1), unless the authorization is terminated or revoked sooner.  Performed at [facility], [address], [place], [place] [address]   SARS Coronavirus 2 by RT PCR (hospital order, performed in [facility] hospital lab) Nasopharyngeal Nasopharyngeal Swab     Status: None   Collection Time: 04/06/20  5:54 PM   Specimen: Nasopharyngeal Swab  Result Value Ref Range Status   SARS Coronavirus 2 NEGATIVE NEGATIVE Final    Comment: (NOTE) SARS-CoV-2 target nucleic acids are NOT DETECTED.  The SARS-CoV-2 RNA is generally detectable in upper and lower respiratory specimens during the acute phase of infection. The lowest concentration of SARS-CoV-2 viral copies this assay can detect is 250 copies / mL. A negative result does not preclude SARS-CoV-2 infection and should not be used as the sole basis for treatment or  other patient management decisions.  A negative result may occur with improper specimen collection / handling, submission of specimen other than nasopharyngeal swab, presence of viral mutation(s) within the areas targeted by this assay, and inadequate number of viral copies (<250 copies / mL). A negative result must be combined with clinical observations, patient history, and epidemiological information.  Fact Sheet for Patients:   BoilerBrush.com.cy  Fact Sheet for Healthcare Providers: https://pope.com/  This test is not yet approved or  cleared by the [place] FDA and has been authorized for detection and/or diagnosis of SARS-CoV-2 by FDA under an Emergency Use Authorization (EUA).  This EUA will remain in effect (meaning this test can be used) for the duration of the COVID-19 declaration under Section 564(b)(1) of the Act, 21 U.S.C. section 360bbb-3(b)(1), unless the authorization is terminated or revoked sooner.  Performed at [facility], [address], [place], [place] [address]      Labs: Basic Metabolic Panel: Recent Labs  Lab 04/12/20 0851  K 4.8  MG 1.5*   Liver Function Tests: No results for input(s): AST, ALT, ALKPHOS, BILITOT, PROT, ALBUMIN in the last 168 hours. No results for input(s): LIPASE, AMYLASE in the last 168 hours. No results for input(s): AMMONIA in the last 168 hours. CBC: No results for input(s): WBC, NEUTROABS, HGB, HCT, MCV, PLT in the last 168 hours. Cardiac Enzymes: No results for input(s): CKTOTAL, CKMB, CKMBINDEX, TROPONINI in the last 168 hours. BNP: BNP (last 3 results) No results for input(s): BNP in the last 8760 hours.  ProBNP (last 3 results) No results for input(s): PROBNP in the last 8760 hours.  CBG: Recent Labs  Lab 04/12/20 0815 04/12/20 1135 04/12/20 1625 04/12/20 2118 04/13/20 0714  GLUCAP 211* 144* 119* 334* 200*        Signed:  Darlin Drop, MD [facility] 04/13/2020, 12:08 PM

## 2020-04-14 LAB — GLUCOSE, CAPILLARY
Glucose-Capillary: 203 mg/dL — ABNORMAL HIGH (ref 70–99)
Glucose-Capillary: 251 mg/dL — ABNORMAL HIGH (ref 70–99)
Glucose-Capillary: 205 mg/dL — ABNORMAL HIGH (ref 70–99)
Glucose-Capillary: 297 mg/dL — ABNORMAL HIGH (ref 70–99)

## 2020-04-14 LAB — MAGNESIUM: Magnesium: 1.9 mg/dL (ref 1.7–2.4)

## 2020-04-14 MED ORDER — ARIPIPRAZOLE 15 MG PO TABS
7.5000 mg | ORAL_TABLET | Freq: Every day | ORAL | 0 refills | Status: DC
Start: 2020-04-15 — End: 2020-04-18

## 2020-04-14 MED ORDER — MAGNESIUM SULFATE 2 GM/50ML IV SOLN
2.0000 g | Freq: Once | INTRAVENOUS | Status: AC
  Administered 2020-04-14: 2 g via INTRAVENOUS
  Filled 2020-04-14: qty 50

## 2020-04-14 NOTE — Plan of Care (Signed)
  Problem: Clinical Measurements: Goal: Ability to maintain clinical measurements within normal limits will improve Outcome: Progressing Goal: Will remain free from infection Outcome: Progressing   

## 2020-04-14 NOTE — TOC Progression Note (Signed)
Transition of Care [facility]) - Progression Note    Patient Details  Name: Gustabo Gordillo MRN: [id number] Date of Birth: Feb 07, 1961  [facility] CM/SW Contact  Darleene Cleaver, [place] Phone Number: 04/14/2020, 3:58 PM  Clinical Narrative:     Patient still does not have an inpatient psychiatric facility bed.  CSW continuing to work on placement for patient.   Expected Discharge Plan: Skilled Nursing Facility Barriers to Discharge: Continued Medical Work up  Expected Discharge Plan and Services Expected Discharge Plan: Skilled Nursing Facility In-house Referral: Clinical Social Work Discharge Planning Services: CM Consult Post Acute Care Choice: Skilled Nursing Facility Living arrangements for the past 2 months: Single Family Home Expected Discharge Date: 04/14/20                                     Social Determinants of Health (SDOH) Interventions    Readmission Risk Interventions No flowsheet data found.

## 2020-04-14 NOTE — Discharge Summary (Addendum)
Discharge Summary  Lytle Malburg ZOX:[id number] DOB: 04-14-1961  PCP: System, Provider Not In  Admit date: 04/05/2020 Discharge date: 04/14/2020  Time spent: 35 minutes  Recommendations for Outpatient Follow-up:  1. Follow up with neurology 2. Follow up with Psychiatry 3. Follow-up with your PCP 4. Take your medications as prescribed. 5. Continue PT OT with assistance and fall precautions.  Discharge Diagnoses:  Active Hospital Problems   Diagnosis Date Noted  . Schizoaffective disorder, bipolar type (HCC) 02/05/2016  . CVA (cerebral vascular accident) (HCC) 04/05/2020  . Frequent falls 04/05/2020  . 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 Suicidal ideation 02/06/2016  . Hyperlipidemia 01/18/2015  . Diabetes mellitus (HCC) 03/11/2012    Resolved Hospital Problems  No resolved problems to display.    Discharge Condition: Stable  Diet recommendation: Heart healthy carb modified diet.  Vitals:   04/13/20 2043 04/14/20 0509  BP: 116/66 121/86  Pulse: 78 89  Resp: 16 20  Temp: 98.1 F (36.7 C) 98.2 F (36.8 C)  SpO2: 100% 99%    History of present illness:  59 year old male with history of diabetes mellitus type 2, hepatitis C, bipolar disorder presented with suicidal ideations, attempt, and falls. He reportedly attempted to place a gun to his head and pulled the trigger, but it was jammed. Patient did admit to recently using cocaine and alcohol 6 days prior to presentation. In the ED, MRI of the brain done on 04/05/2020 was significant for acute on chronic lacunar infarct at the dorsal right lentiform/posterior limb of the right internal capsule. Neurology/psychiatry were consulted.Neurology recommended aspirin and Plavix for 3 months along with Lipitor with outpatient follow-up with neurology. Psychiatry recommended inpatient psychiatric hospitalization. One-to-one sitter in place for patient's own safety.  Currently on oral therapies and  medically cleared for discharge.  Patient will be discharged to inpatient psychiatric facility once bed is available.   04/11/2020 reported left first great toe pain, x-ray complete of left foot was negative for acute abnormalities.  Currently his pain has resolved.  Patient has an extensive psychiatric history and psych team is familiar with him.  Discussed with psychiatry Dr. Jannifer Franklin.  Patient does not meet criteria for inpatient behavioral health at this time.  He is no longer suicidal.  He is on medication for his bipolar disorder and mood has been stable while on his medications.    Patient is not able to care for himself and has a poor social support.  Additionally, he needs assistance with taking his medications.  These have been complicated by his recent stroke.  He would benefit from SNF placement.    He is stable to be discharged from a medical standpoint.  We will continue to seek SNF placement.  We appreciate psychiatry's assistance with the care of this patient and TOC's assistance with SNF placement.    04/14/2020: Seen and examined. No new complaints.  Patient is medically cleared to discharge to SNF.  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 -Continue high dose statin Lipitor 80 mg daily -PT recommended SNF. Tolerating diet as per SLP recommendations. -LDL 171,  goal LDL less than 70. Hemoglobin A1c 14.2, goal A1c less than 7.0. -Carotid ultrasound showed bilateral 1 to 39% ICA stenosis. 2D echo showed EF of 55 to 60% with grade 2 diastolic dysfunction -Neurology has signed off.  -Outpatient follow-up with neurology  Moderate to high-grade focal stenosis in multiple intracerebral vessels -Most likely atherosclerotic but could reflect sequelae of cocaine use as well as per neurology. Outpatient follow-up  Suicidal attempt/schizoaffective /bipolar disorder Recent suicidal attempt with a gun He reportedly attempted to place a gun to his head and pulled the trigger, but it jammed. Continue 1-to-1 sitter May not be a safe discharge to the community at this time due to the severity of the previous attempt. Plan to dc to inpatient behavioral health Continue Abilify 7.5 mg daily and trazodone 100 mg qhs as recommended by psych. Awaiting bed placement to SNF  Resolved post repletion: Hypomagnesemia Repleted intravenously Mg2+ 1.9 on 04/14/20  Resolved left great toe pain Reports left great toe pain.  Denies prior hx of gout.  X-ray negative for acute abnormalities. Analgesic as needed.  Twomm vascular protrusion arising fromRICA/CCAbifurcation-infundibulum versus small aneurysm. Outpatient follow-up with repeat CTA in a year as per neurology Follow-up with neurology outpatient.  Diabetes mellitus type 2 uncontrolled with hyperglycemia -A1c 14.2. Continue Levemir along with Humalog. Carb modified diet.  Resume Metformin at discharge.  Outpatient follow-up with PCP  Recurrent falls/ambulatory dysfunction -PT recommends SNF.He will likely need to go to SNF from inpatient psychiatric hospital.  Continue PT OT with assistance and fall precautions  Hyperlipidemia LDL 171, goal less than 70. High intensity statin, Lipitor 80 mg daily. Follow-up with your PCP  Polysubstance abuse -Longstanding history of cocaine/alcohol  abuse.  Urine drug screen was positive for cocaine on 04/05/20.  Polysubstance cessation counseling.     Code Status: Full     Consultants:  Psychiatry  Procedures:  None  Antimicrobials:  None  DVT prophylaxis:  None   Disposition Plan:   Status is: Inpatient    Dispo: The patient is from: Home  Anticipated d/c is to: Psych ward   Anticipated d/c date is: 04/14/20  Patient currently medically stable for discharge.   Discharge Exam: BP 121/86 (BP Location: Left Arm)   Pulse 89   Temp 98.2 F (36.8 C) (Oral)   Resp 20   Ht 5\' 9"  (1.753 m)   Wt 75.3 kg   SpO2 99%   BMI 24.51 kg/m  . General: 59 y.o. year-old male Well developed well nourished in NAD. A&O x 3 . Cardiovascular: RRR no rubs or gallops..   . Respiratory: Clear to auscultation no wheezes or rales.   . Abdomen: Soft nontender normal bowel sounds present.   . Musculoskeletal: No lower extremity edema bilaterally. 46 Psychiatry: Mood is appropriate for condition.   Discharge Instructions You were cared for by a hospitalist during your hospital stay. If you have any questions about your discharge medications or the care you received while you were in the hospital after you are discharged, you can call the unit and asked to speak with the hospitalist on call if the hospitalist that took care of you is not available. Once you are discharged, your primary care physician will handle any further medical issues. Please note that NO REFILLS for any discharge medications will be authorized once you  are discharged, as it is imperative that you return to your primary care physician (or establish a relationship with a primary care physician if you do not have one) for your aftercare needs so that they can reassess your need for medications and monitor your lab values.  Discharge Instructions    Ambulatory referral to Neurology   Complete by: As directed      An appointment is requested in approximately: 2-3 weeks for follow-up of stroke   Diet - low sodium heart healthy   Complete by: As directed    Diet Carb Modified   Complete by: As directed    Increase activity slowly   Complete by: As directed      Allergies as of 04/14/2020      Reactions   Wellbutrin [bupropion] Hives, Swelling      Medication List    STOP taking these medications   FLUoxetine 40 MG capsule Commonly known as: PROZAC   glipiZIDE 10 MG tablet Commonly known as: GLUCOTROL   hydrOXYzine 25 MG tablet Commonly known as: ATARAX/VISTARIL   QUEtiapine 200 MG tablet Commonly known as: SEROQUEL   zolpidem 5 MG tablet Commonly known as: AMBIEN     TAKE these medications   ARIPiprazole 15 MG tablet Commonly known as: ABILIFY Take 0.5 tablets (7.5 mg total) by mouth daily. Start taking on: April 15, 2020 What changed: how much to take   aspirin 325 MG tablet Take 1 tablet (325 mg total) by mouth daily.   atorvastatin 80 MG tablet Commonly known as: LIPITOR Take 1 tablet (80 mg total) by mouth daily.   clopidogrel 75 MG tablet Commonly known as: PLAVIX Take 1 tablet (75 mg total) by mouth daily.   folic acid 1 MG tablet Commonly known as: FOLVITE Take 1 tablet (1 mg total) by mouth daily.   HumaLOG KwikPen 100 UNIT/ML KwikPen Generic drug: insulin lispro Inject 0-0.1 mLs (0-10 Units total) into the skin 3 (three) times daily with meals.   insulin detemir 100 UNIT/ML injection Commonly known as: LEVEMIR Inject 0.45 mLs (45 Units total) into the skin at bedtime. What changed:   how much to take  additional instructions   metFORMIN 1000 MG tablet Commonly known as: GLUCOPHAGE Take 1 tablet (1,000 mg total) by mouth 2 (two) times daily with a meal.   multivitamin with minerals Tabs tablet Take 1 tablet by mouth daily.   thiamine 100 MG tablet Take 1 tablet (100 mg total) by mouth daily.   traZODone 100 MG tablet Commonly known as:  DESYREL Take 1 tablet (100 mg total) by mouth at bedtime.            Durable Medical Equipment  (From admission, onward)         Start     Ordered   04/10/20 1439  For home use only DME 4 wheeled rolling walker with seat  Once       Question:  Patient needs a walker to treat with the following condition  Answer:  Fear for personal safety   04/10/20 1439         Allergies  Allergen Reactions  . Wellbutrin [Bupropion] Hives and Swelling    Follow-up Information    Milon Dikes, MD. Call in 1 day(s).   Specialty: Neurology Why: Please call for a post hospital follow-up appointment. Contact information: [address] [place] [address] [phone]        [facility]. Call in  1 day(s).   Why: Please call for a post hospital follow-up appointment. Contact information: [address] [place] [address] [phone]               The results of significant diagnostics from this hospitalization (including imaging, microbiology, ancillary and laboratory) are listed below for reference.    Significant Diagnostic Studies: CT ANGIO HEAD W OR WO CONTRAST  Result Date: 04/06/2020 CLINICAL DATA:  Stroke/TIA, assess extracranial arteries, right internal capsule stroke. EXAM: CT ANGIOGRAPHY HEAD AND NECK TECHNIQUE: Multidetector CT imaging of the head and neck was performed using the standard protocol during bolus administration of intravenous contrast. Multiplanar CT image reconstructions and MIPs were obtained to evaluate the vascular anatomy. Carotid stenosis measurements (when applicable) are obtained utilizing NASCET criteria, using the distal internal carotid diameter as the denominator. CONTRAST:  OMNIPAQUE IOHEXOL 350 MG/ML SOLN COMPARISON:  Brain MRI performed earlier the same day 04/05/2020, prior head CT examinations 04/05/2020 and earlier. FINDINGS: CT HEAD FINDINGS Brain: A punctate acute  infarct within the right basal ganglia/posterior limb of right internal capsule was better appreciated on the brain MRI performed earlier the same day. Advanced ill-defined hypoattenuation within the cerebral white matter consistent with chronic small vessel ischemic disease. Redemonstrated chronic lacunar infarcts within the bilateral corona radiata/basal ganglia and right thalamus. Also redemonstrated is a small chronic infarct within the left cerebellar hemisphere. There is no acute intracranial hemorrhage. No demarcated cortical infarct is identified. No extra-axial fluid collection. No evidence of intracranial mass. No midline shift. Vascular: Reported below. Skull: Normal. Negative for fracture or focal lesion. Sinuses: Minimal ethmoid and maxillary sinus mucosal thickening. No significant mastoid effusion. Orbits: No acute finding. Review of the MIP images confirms the above findings CTA NECK FINDINGS Aortic arch: Common origin of the innominate and left common carotid arteries. Atherosclerotic plaque within the visualized aortic arch. No hemodynamically significant innominate or proximal subclavian artery stenosis. Right carotid system: CCA and ICA patent within the neck without significant stenosis (50% or greater). Minimal mixed plaque within the prox ICA. Left carotid system: CCA and ICA patent within the neck without significant stenosis (50% or greater). Mild mixed plaque within the carotid bifurcation and proximal ICA. This includes a small focus of ulcerated plaque along the posterior aspect of the carotid bulb (series 14, image 131) Vertebral arteries: Patent bilaterally without significant stenosis. The left vertebral artery is slightly dominant. Skeleton: No acute bony abnormality or aggressive osseous lesion. Cervical spondylosis with multilevel disc space narrowing, posterior disc osteophytes, uncovertebral and facet hypertrophy. Additionally, there are multilevel prominent ventral osteophytes  most notably at the C5-C6 and C6-C7 levels. Other neck: No neck mass or cervical lymphadenopathy. Upper chest: No consolidation within the imaged lung apices. Review of the MIP images confirms the above findings CTA HEAD FINDINGS Anterior circulation: The intracranial internal carotid arteries are patent. Minimal calcified plaque within the intracranial left ICA without stenosis. The M1 middle cerebral arteries are patent without significant stenosis. Mild-to-moderate segmental narrowing of the proximal to mid M1 left MCA (series 15, image 50). No M2 proximal branch occlusion is identified. There are high-grade focal stenoses within multiple distal M2 left MCA branch vessels (for instance as seen on series 17, image 28). The anterior cerebral arteries are patent. Moderate/severe segmental stenosis within the A2 right anterior cerebral artery (series 17, image 22). 2 mm inferiorly projecting vascular protrusion arising from the paraclinoid right ICA, which may reflect an infundibulum or small aneurysm (series 14, image 87).  Posterior circulation: The intracranial vertebral arteries are patent. The basilar artery is patent. Fetal origin left posterior cerebral artery. The posterior cerebral arteries are patent bilaterally. There is a moderate focal stenosis within the P1 right PCA. Severe focal stenosis within the proximal P2 right PCA. Moderate/severe focal stenoses within the left posterior communicating artery and P2 left PCA (series 15, image 50). The right posterior communicating artery is hypoplastic or absent. Venous sinuses: Within limitations of contrast timing, no convincing thrombus. Anatomic variants: As described Review of the MIP images confirms the above findings IMPRESSION: CT head: 1. A punctate acute infarct within the right basal ganglia/posterior limb of right internal capsule was better appreciated on the brain MRI performed earlier the same day. 2. Unchanged advanced cerebral white matter chronic  small vessel ischemic disease with multiple chronic lacunar infarcts within the bilateral corona radiata/basal ganglia and right thalamus. 3. Redemonstrated small chronic infarct within the left cerebellum. CTA neck: The common carotid, internal carotid and vertebral arteries are patent within the neck without hemodynamically significant stenosis. Mild calcified plaque within the carotid bifurcations/proximal ICAs bilaterally. This includes a focus of ulcerated plaque within the posterior aspect of the left carotid bulb. CTA head: 1. No intracranial large vessel occlusion. 2. Intracranial atherosclerotic disease with multifocal stenoses, most notably as follows. 3. Mild-to-moderate segmental narrowing of the proximal to mid M1 left MCA. 4. High-grade focal stenoses within multiple distal M2 left MCA branch vessels. 5. Moderate/severe segmental stenosis within the A2 right anterior cerebral artery. 6. Moderate/severe focal stenosis within the P1 right PCA 7. Severe focal stenosis within the P2 right PCA. 8. The left PCA is fetal in origin. Moderate/severe focal stenoses within the left posterior communicating artery and P2 left PCA. 9. 2 mm vascular protrusion arising from the paraclinoid right ICA which may reflect an infundibulum or small aneurysm. Electronically Signed   By: Jackey Loge DO   On: 04/06/2020 14:34   DG Chest 2 View  Result Date: 04/04/2020 CLINICAL DATA:  Hyperglycemic, suicidal thoughts X 3 days. History of hypertension, diabetes, schizoaffective and bipolar disorder, hepatitis. EXAM: CHEST - 2 VIEW COMPARISON:  Chest x-ray 02/28/2016. chest x-ray 02/03/2013 FINDINGS: The heart size and mediastinal contours are within normal limits. Both lungs are clear. No acute osseous finding. Multilevel degenerative changes of the spine with chronic mild anterior wedge shaped compression deformity of a midthoracic body IMPRESSION: No active cardiopulmonary disease. Electronically Signed   By: Tish Frederickson M.D.   On: 04/04/2020 10:48   CT Head Wo Contrast  Result Date: 04/05/2020 CLINICAL DATA:  Trauma EXAM: CT HEAD WITHOUT CONTRAST CT CERVICAL SPINE WITHOUT CONTRAST TECHNIQUE: Multidetector CT imaging of the head and cervical spine was performed following the standard protocol without intravenous contrast. Multiplanar CT image reconstructions of the cervical spine were also generated. COMPARISON:  01/18/2015 head CT. FINDINGS: CT HEAD FINDINGS Brain: Age indeterminate focal hypodensities involving the left basal ganglia (3:14) and superior left cerebellum (7:46, new since prior exam. Additional scattered and confluent hypodense foci involving the periventricular and subcortical white matter are nonspecific however may reflect chronic microvascular ischemic changes. No mass lesion. No midline shift, ventriculomegaly or extra-axial fluid collection. Mild cerebral atrophy with ex vacuo dilatation. Vascular: No hyperdense vessel or unexpected calcification. Skull: Negative for fracture or focal lesion. Sinuses/Orbits: Normal orbits. Clear paranasal sinuses. No mastoid effusion. Other: None. CT CERVICAL SPINE FINDINGS Alignment: Straightening of lordosis.  No listhesis. Skull base and vertebrae: No acute fracture. No primary bone lesion or focal  pathologic process. Soft tissues and spinal canal: No prevertebral fluid or swelling. No visible canal hematoma. Disc levels: Multilevel spondylosis most prominent at the C5-7 levels. Patent bony spinal canal. Multilevel mild-to-moderate bony neural foraminal narrowing secondary to uncovertebral and facet hypertrophy. Upper chest: Clear lung apices. Other: None. IMPRESSION: Focal hypodensities involving the left basal ganglia and superior left cerebellum are age indeterminate. Consider MRI brain to exclude subacute insults. Mild cerebral atrophy and chronic microvascular ischemic changes. No acute fracture or traumatic listhesis.  Multilevel spondylosis. These results  were called by telephone at the time of interpretation on 04/05/2020 at 10:51 am to provider [facility] , who verbally acknowledged these results. Electronically Signed   By: Stana Bunting M.D.   On: 04/05/2020 11:02   CT ANGIO NECK W OR WO CONTRAST  Result Date: 04/06/2020 CLINICAL DATA:  Stroke/TIA, assess extracranial arteries, right internal capsule stroke. EXAM: CT ANGIOGRAPHY HEAD AND NECK TECHNIQUE: Multidetector CT imaging of the head and neck was performed using the standard protocol during bolus administration of intravenous contrast. Multiplanar CT image reconstructions and MIPs were obtained to evaluate the vascular anatomy. Carotid stenosis measurements (when applicable) are obtained utilizing NASCET criteria, using the distal internal carotid diameter as the denominator. CONTRAST:  OMNIPAQUE IOHEXOL 350 MG/ML SOLN COMPARISON:  Brain MRI performed earlier the same day 04/05/2020, prior head CT examinations 04/05/2020 and earlier. FINDINGS: CT HEAD FINDINGS Brain: A punctate acute infarct within the right basal ganglia/posterior limb of right internal capsule was better appreciated on the brain MRI performed earlier the same day. Advanced ill-defined hypoattenuation within the cerebral white matter consistent with chronic small vessel ischemic disease. Redemonstrated chronic lacunar infarcts within the bilateral corona radiata/basal ganglia and right thalamus. Also redemonstrated is a small chronic infarct within the left cerebellar hemisphere. There is no acute intracranial hemorrhage. No demarcated cortical infarct is identified. No extra-axial fluid collection. No evidence of intracranial mass. No midline shift. Vascular: Reported below. Skull: Normal. Negative for fracture or focal lesion. Sinuses: Minimal ethmoid and maxillary sinus mucosal thickening. No significant mastoid effusion. Orbits: No acute finding. Review of the MIP images confirms the above findings CTA NECK FINDINGS  Aortic arch: Common origin of the innominate and left common carotid arteries. Atherosclerotic plaque within the visualized aortic arch. No hemodynamically significant innominate or proximal subclavian artery stenosis. Right carotid system: CCA and ICA patent within the neck without significant stenosis (50% or greater). Minimal mixed plaque within the prox ICA. Left carotid system: CCA and ICA patent within the neck without significant stenosis (50% or greater). Mild mixed plaque within the carotid bifurcation and proximal ICA. This includes a small focus of ulcerated plaque along the posterior aspect of the carotid bulb (series 14, image 131) Vertebral arteries: Patent bilaterally without significant stenosis. The left vertebral artery is slightly dominant. Skeleton: No acute bony abnormality or aggressive osseous lesion. Cervical spondylosis with multilevel disc space narrowing, posterior disc osteophytes, uncovertebral and facet hypertrophy. Additionally, there are multilevel prominent ventral osteophytes most notably at the C5-C6 and C6-C7 levels. Other neck: No neck mass or cervical lymphadenopathy. Upper chest: No consolidation within the imaged lung apices. Review of the MIP images confirms the above findings CTA HEAD FINDINGS Anterior circulation: The intracranial internal carotid arteries are patent. Minimal calcified plaque within the intracranial left ICA without stenosis. The M1 middle cerebral arteries are patent without significant stenosis. Mild-to-moderate segmental narrowing of the proximal to mid M1 left MCA (series 15, image 50). No M2 proximal branch occlusion is  identified. There are high-grade focal stenoses within multiple distal M2 left MCA branch vessels (for instance as seen on series 17, image 28). The anterior cerebral arteries are patent. Moderate/severe segmental stenosis within the A2 right anterior cerebral artery (series 17, image 22). 2 mm inferiorly projecting vascular protrusion  arising from the paraclinoid right ICA, which may reflect an infundibulum or small aneurysm (series 14, image 87). Posterior circulation: The intracranial vertebral arteries are patent. The basilar artery is patent. Fetal origin left posterior cerebral artery. The posterior cerebral arteries are patent bilaterally. There is a moderate focal stenosis within the P1 right PCA. Severe focal stenosis within the proximal P2 right PCA. Moderate/severe focal stenoses within the left posterior communicating artery and P2 left PCA (series 15, image 50). The right posterior communicating artery is hypoplastic or absent. Venous sinuses: Within limitations of contrast timing, no convincing thrombus. Anatomic variants: As described Review of the MIP images confirms the above findings IMPRESSION: CT head: 1. A punctate acute infarct within the right basal ganglia/posterior limb of right internal capsule was better appreciated on the brain MRI performed earlier the same day. 2. 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 3. Redemonstrated small chronic infarct within the left cerebellum. CTA neck: The common carotid, internal carotid and vertebral arteries are patent within the neck without hemodynamically significant stenosis. Mild calcified plaque within the carotid bifurcations/proximal ICAs bilaterally. This includes a focus of ulcerated plaque within the posterior aspect of the left carotid bulb. CTA head: 1. No intracranial large vessel occlusion. 2. Intracranial atherosclerotic disease with multifocal stenoses, most notably as follows. 3. Mild-to-moderate segmental narrowing of the proximal to mid M1 left MCA. 4. High-grade focal stenoses within multiple distal M2 left MCA branch vessels. 5. Moderate/severe segmental stenosis within the A2 right anterior cerebral artery. 6.  Moderate/severe focal stenosis within the P1 right PCA 7. Severe focal stenosis within the P2 right PCA. 8. The left PCA is fetal in origin. Moderate/severe focal stenoses within the left posterior communicating artery and P2 left PCA. 9. 2 mm vascular protrusion arising from the paraclinoid right ICA which may reflect an infundibulum or small aneurysm. Electronically Signed   By: Jackey Loge DO   On: 04/06/2020 14:34   CT Cervical Spine Wo Contrast  Result Date: 04/05/2020 CLINICAL DATA:  Trauma EXAM: CT HEAD WITHOUT CONTRAST CT CERVICAL SPINE WITHOUT CONTRAST TECHNIQUE: Multidetector CT imaging of the head and cervical spine was performed following the standard protocol without intravenous contrast. Multiplanar CT image reconstructions of the cervical spine were also generated. COMPARISON:  01/18/2015 head CT. FINDINGS: CT HEAD FINDINGS Brain: Age indeterminate focal hypodensities involving the left basal ganglia (3:14) and superior left cerebellum (7:46, new since prior exam. Additional scattered and confluent hypodense foci involving the periventricular and subcortical white matter are nonspecific however may reflect chronic microvascular ischemic changes. No mass lesion. No midline shift, ventriculomegaly or extra-axial fluid collection. Mild cerebral atrophy with ex vacuo dilatation. Vascular: No hyperdense vessel or unexpected calcification. Skull: Negative for fracture or focal lesion. Sinuses/Orbits: Normal orbits. Clear paranasal sinuses. No mastoid effusion. Other: None. CT CERVICAL SPINE FINDINGS Alignment: Straightening of lordosis.  No listhesis. Skull base and vertebrae: No acute fracture. No primary bone lesion or focal pathologic process. Soft tissues and spinal canal: No prevertebral fluid or swelling. No visible canal hematoma. Disc levels: Multilevel spondylosis most prominent at the C5-7 levels. Patent bony spinal  canal. Multilevel mild-to-moderate bony neural foraminal narrowing secondary  to uncovertebral and facet hypertrophy. Upper chest: Clear lung apices. Other: None. IMPRESSION: Focal hypodensities involving the left basal ganglia and superior left cerebellum are age indeterminate. Consider MRI brain to exclude subacute insults. Mild cerebral atrophy and chronic microvascular ischemic changes. No acute fracture or traumatic listhesis.  Multilevel spondylosis. These results were called by telephone at the time of interpretation on 04/05/2020 at 10:51 am to provider [facility] , who verbally acknowledged these results. Electronically Signed   By: Stana Bunting M.D.   On: 04/05/2020 11:02   MR Brain Wo Contrast (neuro protocol)  Result Date: 04/05/2020 CLINICAL DATA:  58 year old male status post trauma, neurologic deficit with left side weakness. Age indeterminate ischemia in the left basal ganglia and left cerebellum on plain head CT earlier today. EXAM: MRI HEAD WITHOUT CONTRAST TECHNIQUE: Multiplanar, multiecho pulse sequences of the brain and surrounding structures were obtained without intravenous contrast. COMPARISON:  Head CT 1029 hours today. FINDINGS: Brain: Questionable punctate focus of restricted diffusion at the dorsal right lentiform near the posterior limb of the right internal capsule, occurring in an area of chronic small vessel ischemia and encephalomalacia (series 5, image 80 and series 8, image 15). No other restricted diffusion. 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 Moderate patchy T2 hyperintensity in the right paracentral pons also most resembles chronic small-vessel ischemia. No cortical encephalomalacia or definite chronic blood products. No midline shift, mass effect, evidence of mass lesion, ventriculomegaly, extra-axial collection or acute  intracranial hemorrhage. Cervicomedullary junction and pituitary are within normal limits. Vascular: Major intracranial vascular flow voids are preserved. Skull and upper cervical spine: Negative for age visible cervical spine, bone marrow signal. Sinuses/Orbits: Negative orbits. Paranasal sinuses and mastoids are stable and well pneumatized. Other: There is a small volume of fluid trapped in the left petrous apex (series 10, image 12) with facilitated diffusion and no complicating features. Otherwise grossly normal visible internal auditory structures. Scalp and face appear negative. IMPRESSION: 1. Suspect punctate acute on chronic lacunar infarct at the dorsal right lentiform / posterior limb of the right internal capsule. No associated hemorrhage or mass effect. 2. Underlying advanced chronic small vessel disease, with chronic ischemia in the left basal ganglia and the left cerebellum SCA territory corresponding to head CT findings earlier today. Electronically Signed   By: Odessa Fleming M.D.   On: 04/05/2020 13:44   DG Knee Complete 4 Views Left  Result Date: 04/05/2020 CLINICAL DATA:  Left knee pain after fall. EXAM: LEFT KNEE - COMPLETE 4+ VIEW COMPARISON:  None. FINDINGS: No evidence of fracture, dislocation, or joint effusion. No evidence of arthropathy or other focal bone abnormality. Soft tissues are unremarkable. IMPRESSION: Negative. Electronically Signed   By: Lupita Raider M.D.   On: 04/05/2020 11:03   DG Foot Complete Left  Result Date: 04/11/2020 CLINICAL DATA:  LEFT great toe pain, no injury EXAM: LEFT FOOT - COMPLETE 3+ VIEW COMPARISON:  None FINDINGS: Osseous demineralization. Joint spaces preserved. No acute fracture, dislocation, or bone destruction. IMPRESSION: No acute abnormalities. Electronically Signed   By: Ulyses Southward M.D.   On: 04/11/2020 14:36   ECHOCARDIOGRAM COMPLETE  Result Date: 04/06/2020    ECHOCARDIOGRAM REPORT   Patient Name:   KALIL WOESSNER Date of Exam:  04/06/2020  Medical Rec #:  [id number]      Height:       69.0 in Accession #:    [id number]     Weight:       175.0 lb Date of Birth:  07-07-1961       BSA:          1.952 m Patient Age:    59 years       BP:           125/70 mmHg Patient Gender: M              HR:           68 bpm. Exam Location:  Inpatient Procedure: 2D Echo, Cardiac Doppler, Color Doppler and Strain Analysis Indications:    Stroke  History:        Patient has prior history of Echocardiogram examinations, most                 recent 02/29/2016. Stroke, Signs/Symptoms:Chest Pain; Risk                 Factors:Diabetes and Dyslipidemia. Cocaine use.  Sonographer:    Sheralyn Boatman RDCS Referring Phys: [id number] RONDELL A SMITH  Sonographer Comments: Technically difficult study due to poor echo windows. Global longitudinal strain was attempted. IMPRESSIONS  1. Left ventricular ejection fraction, by estimation, is 55 to 60%. The left ventricle has normal function. The left ventricle has no regional wall motion abnormalities. There is moderate left ventricular hypertrophy. Left ventricular diastolic parameters are consistent with Grade II diastolic dysfunction (pseudonormalization). The average left ventricular global longitudinal strain is -18.1 %.  2. Right ventricular systolic function is normal. The right ventricular size is normal.  3. The mitral valve is grossly normal. Trivial mitral valve regurgitation.  4. The aortic valve is grossly normal. Aortic valve regurgitation is not visualized. No aortic stenosis is present. FINDINGS  Left Ventricle: Left ventricular ejection fraction, by estimation, is 55 to 60%. The left ventricle has normal function. The left ventricle has no regional wall motion abnormalities. The average left ventricular global longitudinal strain is -18.1 %. The left ventricular internal cavity size was normal in size. There is moderate left ventricular hypertrophy. Left ventricular diastolic parameters are consistent with Grade II diastolic  dysfunction (pseudonormalization). Right Ventricle: The right ventricular size is normal. No increase in right ventricular wall thickness. Right ventricular systolic function is normal. Left Atrium: Left atrial size was normal in size. Right Atrium: Right atrial size was normal in size. Pericardium: There is no evidence of pericardial effusion. Mitral Valve: The mitral valve is grossly normal. Trivial mitral valve regurgitation. Tricuspid Valve: The tricuspid valve is grossly normal. Tricuspid valve regurgitation is trivial. Aortic Valve: The aortic valve is grossly normal. Aortic valve regurgitation is not visualized. No aortic stenosis is present. Pulmonic Valve: The pulmonic valve was normal in structure. Pulmonic valve regurgitation is not visualized. Aorta: The aortic root and ascending aorta are structurally normal, with no evidence of dilitation. IAS/Shunts: The atrial septum is grossly normal.  LEFT VENTRICLE PLAX 2D LVIDd:         3.60 cm     Diastology LVIDs:         2.50 cm     LV e' medial:    7.07 cm/s LV PW:         1.70 cm     LV E/e' medial:  9.5 LV IVS:        1.30 cm  LV e' lateral:   10.60 cm/s LVOT diam:     1.90 cm     LV E/e' lateral: 6.3 LV SV:         45 LV SV Index:   23          2D Longitudinal Strain LVOT Area:     2.84 cm    2D Strain GLS Avg:     -18.1 %  LV Volumes (MOD) LV vol d, MOD A2C: 47.7 ml LV vol d, MOD A4C: 71.2 ml LV vol s, MOD A2C: 23.2 ml LV vol s, MOD A4C: 29.2 ml LV SV MOD A2C:     24.5 ml LV SV MOD A4C:     71.2 ml LV SV MOD BP:      32.5 ml RIGHT VENTRICLE             IVC RV S prime:     10.90 cm/s  IVC diam: 1.80 cm TAPSE (M-mode): 1.9 cm LEFT ATRIUM             Index      RIGHT ATRIUM           Index LA diam:        4.00 cm 2.05 cm/m RA Area:     11.70 cm LA Vol (A2C):   13.0 ml 6.66 ml/m RA Volume:   25.20 ml  12.91 ml/m LA Vol (A4C):   18.2 ml 9.32 ml/m LA Biplane Vol: 16.8 ml 8.61 ml/m  AORTIC VALVE LVOT Vmax:   89.20 cm/s LVOT Vmean:  63.900 cm/s LVOT  VTI:    0.159 m  AORTA Ao Root diam: 3.30 cm Ao Asc diam:  3.20 cm MITRAL VALVE MV Area (PHT): 2.73 cm    SHUNTS MV Decel Time: 278 msec    Systemic VTI:  0.16 m MV E velocity: 67.20 cm/s  Systemic Diam: 1.90 cm MV A velocity: 50.00 cm/s MV E/A ratio:  1.34 Kristeen Miss MD Electronically signed by Kristeen Miss MD Signature Date/Time: 04/06/2020/12:47:27 PM    Final    VAS US CAROTID  Result Date: 04/06/2020 Carotid Arterial Duplex Study Indications:       CVA. Risk Factors:      Prior CVA. Comparison Study:  No prior studies. Performing Technologist: Chanda Busing RVT  Examination Guidelines: A complete evaluation includes B-mode imaging, spectral Doppler, color Doppler, and power Doppler as needed of all accessible portions of each vessel. Bilateral testing is considered an integral part of a complete examination. Limited examinations for reoccurring indications may be performed as noted.  Right Carotid Findings: +----------+--------+--------+--------+-----------------------+--------+           PSV cm/sEDV cm/sStenosisPlaque Description     Comments +----------+--------+--------+--------+-----------------------+--------+ CCA Prox  85      17              smooth and heterogenous         +----------+--------+--------+--------+-----------------------+--------+ CCA Distal42      14              smooth and heterogenous         +----------+--------+--------+--------+-----------------------+--------+ ICA Prox  38      16              smooth and heterogenous         +----------+--------+--------+--------+-----------------------+--------+ ICA Distal58      21                                              +----------+--------+--------+--------+-----------------------+--------+  ECA       87      16                                              +----------+--------+--------+--------+-----------------------+--------+ +----------+--------+-------+--------+-------------------+            PSV cm/sEDV cmsDescribeArm Pressure (mmHG) +----------+--------+-------+--------+-------------------+ [id number]                                        +----------+--------+-------+--------+-------------------+ +---------+--------+--+--------+--+---------+ VertebralPSV cm/s32EDV cm/s13Antegrade +---------+--------+--+--------+--+---------+  Left Carotid Findings: +----------+--------+--------+--------+-----------------------+--------+           PSV cm/sEDV cm/sStenosisPlaque Description     Comments +----------+--------+--------+--------+-----------------------+--------+ CCA Prox  75      15              smooth and heterogenous         +----------+--------+--------+--------+-----------------------+--------+ CCA Distal31      8               smooth and heterogenous         +----------+--------+--------+--------+-----------------------+--------+ ICA Prox  37      14              smooth and heterogenous         +----------+--------+--------+--------+-----------------------+--------+ ICA Distal84      36                                              +----------+--------+--------+--------+-----------------------+--------+ ECA       77      18                                              +----------+--------+--------+--------+-----------------------+--------+ +----------+--------+--------+--------+-------------------+           PSV cm/sEDV cm/sDescribeArm Pressure (mmHG) +----------+--------+--------+--------+-------------------+ [id number]                                          +----------+--------+--------+--------+-------------------+ +---------+--------+--+--------+--+---------+ VertebralPSV cm/s46EDV cm/s14Antegrade +---------+--------+--+--------+--+---------+   Summary: Right Carotid: Velocities in the right ICA are consistent with a 1-39% stenosis. Left Carotid: Velocities in the left ICA are consistent with a 1-39% stenosis.  Vertebrals: Bilateral vertebral arteries demonstrate antegrade flow. *See table(s) above for measurements and observations.  Electronically signed by Sherald Hesshristopher Clark MD on 04/06/2020 at 4:37:52 PM.    Final     Microbiology: Recent Results (from the past 240 hour(s))  SARS Coronavirus 2 by RT PCR (hospital order, [facility]) Nasopharyngeal Nasopharyngeal Swab     Status: None   Collection Time: 04/06/20  5:54 PM   Specimen: Nasopharyngeal Swab  Result Value Ref Range Status   SARS Coronavirus 2 NEGATIVE NEGATIVE Final    Comment: (NOTE) SARS-CoV-2 target nucleic acids are NOT DETECTED.  The SARS-CoV-2 RNA is generally detectable in upper and lower respiratory specimens during the acute phase of infection. The lowest concentration of SARS-CoV-2 viral copies this assay can detect is 250 copies /  mL. A negative result does not preclude SARS-CoV-2 infection and should not be used as the sole basis for treatment or other patient management decisions.  A negative result may occur with improper specimen collection / handling, submission of specimen other than nasopharyngeal swab, presence of viral mutation(s) within the areas targeted by this assay, and inadequate number of viral copies (<250 copies / mL). A negative result must be combined with clinical observations, patient history, and epidemiological information.  Fact Sheet for Patients:   BoilerBrush.com.cy  Fact Sheet for Healthcare Providers: https://pope.com/  This test is not yet approved or  cleared by the [place] FDA and has been authorized for detection and/or diagnosis of SARS-CoV-2 by FDA under an Emergency Use Authorization (EUA).  This EUA will remain in effect (meaning this test can be used) for the duration of the COVID-19 declaration under Section 564(b)(1) of the Act, 21 U.S.C. section 360bbb-3(b)(1), unless the authorization is  terminated or revoked sooner.  Performed at [facility], [address] [place], [place] [address]      Labs: Basic Metabolic Panel: Recent Labs  Lab 04/12/20 0851 04/14/20 0951  K 4.8  --   MG 1.5* 1.9   Liver Function Tests: No results for input(s): AST, ALT, ALKPHOS, BILITOT, PROT, ALBUMIN in the last 168 hours. No results for input(s): LIPASE, AMYLASE in the last 168 hours. No results for input(s): AMMONIA in the last 168 hours. CBC: No results for input(s): WBC, NEUTROABS, HGB, HCT, MCV, PLT in the last 168 hours. Cardiac Enzymes: No results for input(s): CKTOTAL, CKMB, CKMBINDEX, TROPONINI in the last 168 hours. BNP: BNP (last 3 results) No results for input(s): BNP in the last 8760 hours.  ProBNP (last 3 results) No results for input(s): PROBNP in the last 8760 hours.  CBG: Recent Labs  Lab 04/13/20 0714 04/13/20 1705 04/13/20 2040 04/14/20 0811 04/14/20 1055  GLUCAP 200* 310* 171* 203* 251*       Signed:  Darlin Drop, MD [facility] 04/14/2020, 12:16 PM

## 2020-04-14 NOTE — Progress Notes (Signed)
Patient has an extensive psychiatric history and psych team is familiar with him.  Discussed with psychiatry Dr. Jannifer Franklin.  Patient does not meet criteria for inpatient behavioral health at this time.  He is no longer suicidal.  He is on medication for his bipolar disorder and mood has been stable while on his medications.    Patient is not able to care for himself and has a poor social support.  Additionally, he needs assistance with taking his medications.  These have been complicated by his recent stroke.  He would benefit from SNF placement.    He is stable to be discharged from a medical standpoint.  We will continue to seek SNF placement.  We appreciate psychiatry's assistance with the care of this patient and TOC's assistance with SNF placement.

## 2020-04-14 NOTE — Discharge Instructions (Signed)
Eating Plan After Stroke A stroke causes damage to the brain cells, which can affect your ability to walk, talk, and even eat. The impact of a stroke is different for everyone, and so is recovery. A good nutrition plan is important for your recovery. It can also lower your risk of another stroke. If you have difficulty chewing and swallowing your food, a dietitian or your stroke care team can help so that you can enjoy eating healthy foods. What are tips for following this plan?  Reading food labels  Choose foods that have less than 300 milligrams (mg) of sodium per serving. Limit your sodium intake to less than 1,500 mg per day.  Avoid foods that have saturated fat and trans fat.  Choose foods that are low in cholesterol. Limit the amount of cholesterol you eat each day to less than 200 mg.  Choose foods that are high in fiber. Eat 20-30 grams (g) of fiber each day.  Avoid foods with added sugar. Check the food label for ingredients such as sugar, corn syrup, honey, fructose, molasses, and cane juice. Shopping  At the grocery store, buy most of your food from areas near the walls of the store. This includes: ? Fresh fruits and vegetables. ? Dry grains, beans, nuts, and seeds. ? Fresh seafood, poultry, lean meats, and eggs. ? Low-fat dairy products.  Buy whole ingredients instead of prepackaged foods.  Buy fresh, in-season fruits and vegetables from local farmers markets.  Buy frozen fruits and vegetables in resealable bags. Cooking  Prepare foods with very little salt. Use herbs or salt-free spices instead.  Cook with heart-healthy oils, such as olive, avocado, canola, soybean, or sunflower oil.  Avoid frying foods. Bake, grill, or broil foods instead.  Remove visible fat and skin from meat and poultry before eating.  Modify food textures as told by your health care provider. Meal planning  Eat a wide variety of colorful fruits and vegetables. Make sure one-half of your  plate is filled with fruits and vegetables at each meal.  Eat fruits and vegetables that are high in potassium, such as: ? Apples, bananas, oranges, and melon. ? Sweet potatoes, spinach, zucchini, and tomatoes.  Eat fish that contain heart-healthy fats (omega-3 fats) at least twice a week. These include salmon, tuna, mackerel, and sardines.  Eat plant foods that are high in omega-3 fats, such as flaxseeds and walnuts. Add these to cereals, yogurt, or pasta dishes.  Eat several servings of high-fiber foods each day, such as fruits, vegetables, whole grains, and beans.  Do not put salt at the table for meals.  When eating out at restaurants: ? Ask the server about low-salt or salt-free food options. ? Avoid fried foods. Look for menu items that are grilled, steamed, broiled, or roasted. ? Ask if your food can be prepared without butter. ? Ask for condiments, such as salad dressings, gravy, or sauces to be served on the side.  If you have difficulty swallowing: ? Choose foods that are softer and easier to chew and swallow. ? Cut foods into small pieces and chew well before swallowing. ? Thicken liquids as told by your health care provider or dietitian. ? Let your health care provider know if your condition does not improve over time. You may need to work with a speech therapist to re-train the muscles that are used for eating. General recommendations  Involve your family and friends in your recovery, if possible. It may be helpful to have a slower meal time   and to plan meals that include foods everyone in the family can eat.  Brush your teeth with fluoride toothpaste twice a day, and floss once a day. Keeping a clean mouth can help you swallow and can also help your appetite.  Drink enough water each day to keep your urine pale yellow. If needed, set reminders or ask your family to help you remember to drink water.  Limit alcohol intake to no more than 1 drink a day for nonpregnant  women and 2 drinks a day for men. One drink equals 12 oz of beer, 5 oz of wine, or 1 oz of hard liquor. Summary  Following this eating plan can help in your stroke recovery and can decrease your risk for another stroke.  Let your health care provider know if you have problems with swallowing. You may need to work with a speech therapist. This information is not intended to replace advice given to you by your health care provider. Make sure you discuss any questions you have with your health care provider. Document Revised: 11/04/2018 Document Reviewed: 09/21/2017 Elsevier Patient Education  2020 [employer]  

## 2020-04-15 LAB — GLUCOSE, CAPILLARY
Glucose-Capillary: 162 mg/dL — ABNORMAL HIGH (ref 70–99)
Glucose-Capillary: 198 mg/dL — ABNORMAL HIGH (ref 70–99)
Glucose-Capillary: 193 mg/dL — ABNORMAL HIGH (ref 70–99)
Glucose-Capillary: 215 mg/dL — ABNORMAL HIGH (ref 70–99)

## 2020-04-15 MED ORDER — KETOROLAC TROMETHAMINE 30 MG/ML IJ SOLN
30.0000 mg | Freq: Once | INTRAMUSCULAR | Status: AC
  Administered 2020-04-15: 30 mg via INTRAVENOUS
  Filled 2020-04-15: qty 1

## 2020-04-15 NOTE — Progress Notes (Addendum)
Discharge Summary  [name]:[id number] DOB: [birth year]  PCP: System, Provider Not In  Admit date: 04/05/2020 Discharge date: 04/15/2020  Time spent: 35 minutes  Recommendations for Outpatient Follow-up:  1. Follow up with neurology 2. Follow up with Psychiatry 3. Follow-up with your PCP 4. Take your medications as prescribed. 5. Continue PT OT with assistance and fall precautions.  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    Discharge Condition: Stable  Diet recommendation: Heart healthy carb modified diet.  Vitals:   04/14/20 2206 04/15/20 0601  BP: (!) 150/74 109/78  Pulse: 81 79  Resp: 20 18  Temp: 98.5 F (36.9 C) 97.6 F (36.4 C)  SpO2: 100% 100%    History of present illness:  59 year old male with history of diabetes mellitus type 2, hepatitis C, bipolar disorder presented with suicidal ideations, attempt, and falls. He reportedly attempted to place a gun to his head and pulled the trigger, but it was jammed. Patient did admit to recently using cocaine and alcohol 6 days prior to presentation. In the ED, MRI of the brain done on 04/05/2020 was significant for acute on chronic lacunar infarct at the dorsal right lentiform/posterior limb of the right internal capsule. Neurology/psychiatry were consulted.Neurology recommended aspirin and Plavix for 3 months along with Lipitor with outpatient follow-up with neurology. Psychiatry recommended inpatient psychiatric hospitalization. One-to-one sitter in place for patient's own safety.  Currently on oral therapies  and medically cleared for discharge.  Patient will be discharged to inpatient psychiatric facility once bed is available.   04/11/2020 reported left first great toe pain, x-ray complete of left foot was negative for acute abnormalities.  Currently his pain has resolved.  Patient has an extensive psychiatric history and psych team is familiar with him.  Discussed with psychiatry Dr. Jannifer Franklin.  Patient does not meet criteria for inpatient behavioral health at this time.  He is no longer suicidal.  He is on medication for his bipolar disorder and mood has been stable while on his medications.    Patient is not able to care for himself and has a poor social support.  Additionally, he needs assistance with taking his medications.  These have been complicated by his recent stroke.  He would benefit from SNF placement.    He is stable to be discharged from a medical standpoint.  We will continue to seek SNF placement.  We appreciate psychiatry's assistance with the care of this patient and TOC's assistance with SNF placement.    04/15/2020: Seen and examined.  No new complaints.  Awaiting bed placement.  Patient is medically cleared to discharge to SNF.  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 -Continue high dose statin Lipitor 80 mg daily -PT recommended SNF. Tolerating diet as per SLP  recommendations. -LDL 171, goal LDL less than 70. Hemoglobin A1c 14.2, goal A1c less than 7.0. -Carotid ultrasound showed bilateral 1 to 39% ICA stenosis. 2D echo showed EF of 55 to 60% with grade 2 diastolic dysfunction -Neurology has signed off.  -Outpatient follow-up with neurology  Moderate to high-grade focal stenosis in multiple intracerebral vessels -Most likely atherosclerotic but could reflect sequelae of cocaine use as well as per neurology. Outpatient follow-up  Suicidal attempt/schizoaffective /bipolar disorder Recent suicidal attempt with a gun He reportedly attempted to place a gun to his head and pulled the trigger, but it jammed. Continue 1-to-1 sitter May not be a safe discharge to the community at this time due to the severity of the previous attempt. Plan to dc to inpatient behavioral health Continue Abilify 7.5 mg daily and trazodone 100 mg qhs as recommended by psych. Awaiting bed placement to SNF  Resolved post repletion: Hypomagnesemia Repleted intravenously Mg2+ 1.9 on 04/14/20  Resolved left great toe pain Reports left great toe pain.  Denies prior hx of gout.  X-ray negative for acute abnormalities. Analgesic as needed.  Twomm vascular protrusion arising fromRICA/CCAbifurcation-infundibulum versus small aneurysm. Outpatient follow-up with repeat CTA in a year as per neurology Follow-up with neurology outpatient.  Diabetes mellitus type 2 uncontrolled with hyperglycemia -A1c 14.2. Continue Levemir along with Humalog. Carb modified diet.  Resume Metformin at discharge.  Outpatient follow-up with PCP  Recurrent falls/ambulatory dysfunction -PT recommends SNF.He will likely need to go to SNF from inpatient psychiatric hospital.  Continue PT OT with assistance and fall precautions  Hyperlipidemia LDL 171, goal less than 70. High intensity statin, Lipitor 80 mg daily. Follow-up with your PCP  Polysubstance abuse -Longstanding  history of cocaine/alcohol abuse.  Urine drug screen was positive for cocaine on 04/05/20.  Polysubstance cessation counseling.     Code Status: Full     Consultants:  Psychiatry  Procedures:  None  Antimicrobials:  None  DVT prophylaxis:  None   Disposition Plan:   Status is: Inpatient    Dispo: The patient is from: Home  Anticipated d/c is to: Psych ward   Anticipated d/c date is: 04/14/20  Patient currently medically stable for discharge.   Discharge Exam: BP 109/78 (BP Location: Right Arm)    Pulse 79    Temp 97.6 F (36.4 C) (Oral)    Resp 18    Ht 5\' 9"  (1.753 m)    Wt 75.3 kg    SpO2 100%    BMI 24.51 kg/m   General: 59 y.o. year-old male Well developed well nourished in NAD. A&O x 3  Cardiovascular: RRR no rubs or gallops.Marland Kitchen    Respiratory: Clear to auscultation no wheezes or rales.    Abdomen: Soft nontender normal bowel sounds present.    Musculoskeletal: No lower extremity edema bilaterally.  Psychiatry: Mood is appropriate for condition.   Discharge Instructions You were cared for by a hospitalist during your hospital stay. If you have any questions about your discharge medications or the care you received while you were in the hospital after you are discharged, you can call the unit and asked to speak with the hospitalist on call if the hospitalist that took care of you is not available. Once you are discharged, your primary care physician will handle any further medical issues. Please  note that NO REFILLS for any discharge medications will be authorized once you are discharged, as it is imperative that you return to your primary care physician (or establish a relationship with a primary care physician if you do not have one) for your aftercare needs so that they can reassess your need for medications and monitor your lab values.  Discharge Instructions    Ambulatory referral to Neurology    Complete by: As directed    An appointment is requested in approximately: 2-3 weeks for follow-up of stroke   Diet - low sodium heart healthy   Complete by: As directed    Diet Carb Modified   Complete by: As directed    Increase activity slowly   Complete by: As directed      Allergies as of 04/15/2020      Reactions   Wellbutrin [bupropion] Hives, Swelling      Medication List    STOP taking these medications   FLUoxetine 40 MG capsule Commonly known as: PROZAC   glipiZIDE 10 MG tablet Commonly known as: GLUCOTROL   hydrOXYzine 25 MG tablet Commonly known as: ATARAX/VISTARIL   QUEtiapine 200 MG tablet Commonly known as: SEROQUEL   zolpidem 5 MG tablet Commonly known as: AMBIEN     TAKE these medications   ARIPiprazole 15 MG tablet Commonly known as: ABILIFY Take 0.5 tablets (7.5 mg total) by mouth daily. What changed: how much to take   aspirin 325 MG tablet Take 1 tablet (325 mg total) by mouth daily.   atorvastatin 80 MG tablet Commonly known as: LIPITOR Take 1 tablet (80 mg total) by mouth daily.   clopidogrel 75 MG tablet Commonly known as: PLAVIX Take 1 tablet (75 mg total) by mouth daily.   folic acid 1 MG tablet Commonly known as: FOLVITE Take 1 tablet (1 mg total) by mouth daily.   HumaLOG KwikPen 100 UNIT/ML KwikPen Generic drug: insulin lispro Inject 0-0.1 mLs (0-10 Units total) into the skin 3 (three) times daily with meals.   insulin detemir 100 UNIT/ML injection Commonly known as: LEVEMIR Inject 0.45 mLs (45 Units total) into the skin at bedtime. What changed:   how much to take  additional instructions   metFORMIN 1000 MG tablet Commonly known as: GLUCOPHAGE Take 1 tablet (1,000 mg total) by mouth 2 (two) times daily with a meal.   multivitamin with minerals Tabs tablet Take 1 tablet by mouth daily.   thiamine 100 MG tablet Take 1 tablet (100 mg total) by mouth daily.   traZODone 100 MG tablet Commonly known as:  DESYREL Take 1 tablet (100 mg total) by mouth at bedtime.            Durable Medical Equipment  (From admission, onward)         Start     Ordered   04/10/20 1439  For home use only DME 4 wheeled rolling walker with seat  Once       Question:  Patient needs a walker to treat with the following condition  Answer:  Fear for personal safety   04/10/20 1439         Allergies  Allergen Reactions   Wellbutrin [Bupropion] Hives and Swelling    Follow-up Information    Milon Dikes, MD. Call in 1 day(s).   Specialty: Neurology Why: Please call for a post hospital follow-up appointment. Contact information: [address] [place] [address] [phone]        [facility]. Call in 1 day(s).   Why: Please call for a post hospital follow-up appointment. Contact information: [address] [place] [address] [phone]       [facility]. Call in 1 day(s).   Why: please call for a post hospital follow up appointment. Contact information: [address]  [address] [place] [address] [phone]                The results of significant diagnostics from this hospitalization (including imaging, microbiology, ancillary and laboratory) are listed below for reference.    Significant Diagnostic Studies: CT ANGIO HEAD W OR WO CONTRAST  Result Date: 04/06/2020 CLINICAL DATA:  Stroke/TIA, assess extracranial arteries, right internal capsule stroke. EXAM: CT ANGIOGRAPHY HEAD AND NECK TECHNIQUE: Multidetector CT imaging of the head and neck was performed using the standard protocol during bolus administration of intravenous contrast. Multiplanar CT image reconstructions and MIPs were obtained to evaluate the vascular anatomy. Carotid stenosis measurements (when applicable) are obtained utilizing NASCET criteria, using the distal internal carotid diameter as the denominator. CONTRAST:  OMNIPAQUE  IOHEXOL 350 MG/ML SOLN COMPARISON:  Brain MRI performed earlier the same day 04/05/2020, prior head CT examinations 04/05/2020 and earlier. FINDINGS: CT HEAD FINDINGS Brain: A punctate acute infarct within the right basal ganglia/posterior limb of right internal capsule was better appreciated on the brain MRI performed earlier the same day. Advanced ill-defined hypoattenuation within the cerebral white matter consistent with chronic small vessel ischemic disease. Redemonstrated chronic lacunar infarcts within the bilateral corona radiata/basal ganglia and right thalamus. Also redemonstrated is a small chronic infarct within the left cerebellar hemisphere. There is no acute intracranial hemorrhage. No demarcated cortical infarct is identified. No extra-axial fluid collection. No evidence of intracranial mass. No midline shift. Vascular: Reported below. Skull: Normal. Negative for fracture or focal lesion. Sinuses: Minimal ethmoid and maxillary sinus mucosal thickening. No significant mastoid effusion. Orbits: No acute finding. Review of the MIP images confirms the above findings CTA NECK FINDINGS Aortic arch: Common origin of the innominate and left common carotid arteries. Atherosclerotic plaque within the visualized aortic arch. No hemodynamically significant innominate or proximal subclavian artery stenosis. Right carotid system: CCA and ICA patent within the neck without significant stenosis (50% or greater). Minimal mixed plaque within the prox ICA. Left carotid system: CCA and ICA patent within the neck without significant stenosis (50% or greater). Mild mixed plaque within the carotid bifurcation and proximal ICA. This includes a small focus of ulcerated plaque along the posterior aspect of the carotid bulb (series 14, image 131) Vertebral arteries: Patent bilaterally without significant stenosis. The left vertebral artery is slightly dominant. Skeleton: No acute bony abnormality or aggressive osseous lesion.  Cervical spondylosis with multilevel disc space narrowing, posterior disc osteophytes, uncovertebral and facet hypertrophy. Additionally, there are multilevel prominent ventral osteophytes most notably at the C5-C6 and C6-C7 levels. Other neck: No neck mass or cervical lymphadenopathy. Upper chest: No consolidation within the imaged lung apices. Review of the MIP images confirms the above findings CTA HEAD FINDINGS Anterior circulation: The intracranial internal carotid arteries are patent. Minimal calcified plaque within the intracranial left ICA without stenosis. The M1 middle cerebral arteries are patent without significant stenosis. Mild-to-moderate segmental narrowing of the proximal to mid M1 left MCA (series 15, image 50). No M2 proximal branch occlusion is identified. There are high-grade focal stenoses within multiple distal M2 left MCA branch vessels (for instance as seen on series 17, image 28). The anterior  cerebral arteries are patent. Moderate/severe segmental stenosis within the A2 right anterior cerebral artery (series 17, image 22). 2 mm inferiorly projecting vascular protrusion arising from the paraclinoid right ICA, which may reflect an infundibulum or small aneurysm (series 14, image 87). Posterior circulation: The intracranial vertebral arteries are patent. The basilar artery is patent. Fetal origin left posterior cerebral artery. The posterior cerebral arteries are patent bilaterally. There is a moderate focal stenosis within the P1 right PCA. Severe focal stenosis within the proximal P2 right PCA. Moderate/severe focal stenoses within the left posterior communicating artery and P2 left PCA (series 15, image 50). The right posterior communicating artery is hypoplastic or absent. Venous sinuses: Within limitations of contrast timing, no convincing thrombus. Anatomic variants: As described Review of the MIP images confirms the above findings IMPRESSION: CT head: 1. A punctate acute infarct within  the right basal ganglia/posterior limb of right internal capsule was better appreciated on the brain MRI performed earlier the same day. 2. 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 3. Redemonstrated small chronic infarct within the left cerebellum. CTA neck: The common carotid, internal carotid and vertebral arteries are patent within the neck without hemodynamically significant stenosis. Mild calcified plaque within the carotid bifurcations/proximal ICAs bilaterally. This includes a focus of ulcerated plaque within the posterior aspect of the left carotid bulb. CTA head: 1. No intracranial large vessel occlusion. 2. Intracranial atherosclerotic disease with multifocal stenoses, most notably as follows. 3. Mild-to-moderate segmental narrowing of the proximal to mid M1 left MCA. 4. High-grade focal stenoses within multiple distal M2 left MCA branch vessels. 5. Moderate/severe segmental stenosis within the A2 right anterior cerebral artery. 6. Moderate/severe focal stenosis within the P1 right PCA 7. Severe focal stenosis within the P2 right PCA. 8. The left PCA is fetal in origin. Moderate/severe focal stenoses within the left posterior communicating artery and P2 left PCA. 9. 2 mm vascular protrusion arising from the paraclinoid right ICA which may reflect an infundibulum or small aneurysm. Electronically Signed   By: Jackey Loge DO   On: 04/06/2020 14:34   DG Chest 2 View  Result Date: 04/04/2020 CLINICAL DATA:  Hyperglycemic, suicidal thoughts X 3 days. History of hypertension, diabetes, schizoaffective and bipolar disorder, hepatitis. EXAM: CHEST - 2 VIEW COMPARISON:  Chest x-ray 02/28/2016. chest x-ray 02/03/2013 FINDINGS: The heart size and mediastinal contours are within normal limits. Both lungs are clear. No acute osseous finding. Multilevel degenerative  changes of the spine with chronic mild anterior wedge shaped compression deformity of a midthoracic body IMPRESSION: No active cardiopulmonary disease. Electronically Signed   By: Tish Frederickson M.D.   On: 04/04/2020 10:48   CT Head Wo Contrast  Result Date: 04/05/2020 CLINICAL DATA:  Trauma EXAM: CT HEAD WITHOUT CONTRAST CT CERVICAL SPINE WITHOUT CONTRAST TECHNIQUE: Multidetector CT imaging of the head and cervical spine was performed following the standard protocol without intravenous contrast. Multiplanar CT image reconstructions of the cervical spine were also generated. COMPARISON:  01/18/2015 head CT. FINDINGS: CT HEAD FINDINGS Brain: Age indeterminate focal hypodensities involving the left basal ganglia (3:14) and superior left cerebellum (7:46, new since prior exam. Additional scattered and confluent hypodense foci involving the periventricular and subcortical white matter are nonspecific however may reflect chronic microvascular ischemic changes. No mass lesion. No midline shift, ventriculomegaly or extra-axial fluid collection. Mild cerebral atrophy with ex vacuo dilatation. Vascular: No hyperdense vessel or unexpected calcification.  Skull: Negative for fracture or focal lesion. Sinuses/Orbits: Normal orbits. Clear paranasal sinuses. No mastoid effusion. Other: None. CT CERVICAL SPINE FINDINGS Alignment: Straightening of lordosis.  No listhesis. Skull base and vertebrae: No acute fracture. No primary bone lesion or focal pathologic process. Soft tissues and spinal canal: No prevertebral fluid or swelling. No visible canal hematoma. Disc levels: Multilevel spondylosis most prominent at the C5-7 levels. Patent bony spinal canal. Multilevel mild-to-moderate bony neural foraminal narrowing secondary to uncovertebral and facet hypertrophy. Upper chest: Clear lung apices. Other: None. IMPRESSION: Focal hypodensities involving the left basal ganglia and superior left cerebellum are age indeterminate. Consider  MRI brain to exclude subacute insults. Mild cerebral atrophy and chronic microvascular ischemic changes. No acute fracture or traumatic listhesis.  Multilevel spondylosis. These results were called by telephone at the time of interpretation on 04/05/2020 at 10:51 am to provider [facility] , who verbally acknowledged these results. Electronically Signed   By: Stana Buntinghikanele  Emekauwa M.D.   On: 04/05/2020 11:02   CT ANGIO NECK W OR WO CONTRAST  Result Date: 04/06/2020 CLINICAL DATA:  Stroke/TIA, assess extracranial arteries, right internal capsule stroke. EXAM: CT ANGIOGRAPHY HEAD AND NECK TECHNIQUE: Multidetector CT imaging of the head and neck was performed using the standard protocol during bolus administration of intravenous contrast. Multiplanar CT image reconstructions and MIPs were obtained to evaluate the vascular anatomy. Carotid stenosis measurements (when applicable) are obtained utilizing NASCET criteria, using the distal internal carotid diameter as the denominator. CONTRAST:  100mL OMNIPAQUE IOHEXOL 350 MG/ML SOLN COMPARISON:  Brain MRI performed earlier the same day 04/05/2020, prior head CT examinations 04/05/2020 and earlier. FINDINGS: CT HEAD FINDINGS Brain: A punctate acute infarct within the right basal ganglia/posterior limb of right internal capsule was better appreciated on the brain MRI performed earlier the same day. Advanced ill-defined hypoattenuation within the cerebral white matter consistent with chronic small vessel ischemic disease. Redemonstrated chronic lacunar infarcts within the bilateral corona radiata/basal ganglia and right thalamus. Also redemonstrated is a small chronic infarct within the left cerebellar hemisphere. There is no acute intracranial hemorrhage. No demarcated cortical infarct is identified. No extra-axial fluid collection. No evidence of intracranial mass. No midline shift. Vascular: Reported below. Skull: Normal. Negative for fracture or focal lesion. Sinuses:  Minimal ethmoid and maxillary sinus mucosal thickening. No significant mastoid effusion. Orbits: No acute finding. Review of the MIP images confirms the above findings CTA NECK FINDINGS Aortic arch: Common origin of the innominate and left common carotid arteries. Atherosclerotic plaque within the visualized aortic arch. No hemodynamically significant innominate or proximal subclavian artery stenosis. Right carotid system: CCA and ICA patent within the neck without significant stenosis (50% or greater). Minimal mixed plaque within the prox ICA. Left carotid system: CCA and ICA patent within the neck without significant stenosis (50% or greater). Mild mixed plaque within the carotid bifurcation and proximal ICA. This includes a small focus of ulcerated plaque along the posterior aspect of the carotid bulb (series 14, image 131) Vertebral arteries: Patent bilaterally without significant stenosis. The left vertebral artery is slightly dominant. Skeleton: No acute bony abnormality or aggressive osseous lesion. Cervical spondylosis with multilevel disc space narrowing, posterior disc osteophytes, uncovertebral and facet hypertrophy. Additionally, there are multilevel prominent ventral osteophytes most notably at the C5-C6 and C6-C7 levels. Other neck: No neck mass or cervical lymphadenopathy. Upper chest: No consolidation within the imaged lung apices. Review of the MIP images confirms the above findings CTA HEAD FINDINGS Anterior circulation: The intracranial internal carotid arteries are  patent. Minimal calcified plaque within the intracranial left ICA without stenosis. The M1 middle cerebral arteries are patent without significant stenosis. Mild-to-moderate segmental narrowing of the proximal to mid M1 left MCA (series 15, image 50). No M2 proximal branch occlusion is identified. There are high-grade focal stenoses within multiple distal M2 left MCA branch vessels (for instance as seen on series 17, image 28). The  anterior cerebral arteries are patent. Moderate/severe segmental stenosis within the A2 right anterior cerebral artery (series 17, image 22). 2 mm inferiorly projecting vascular protrusion arising from the paraclinoid right ICA, which may reflect an infundibulum or small aneurysm (series 14, image 87). Posterior circulation: The intracranial vertebral arteries are patent. The basilar artery is patent. Fetal origin left posterior cerebral artery. The posterior cerebral arteries are patent bilaterally. There is a moderate focal stenosis within the P1 right PCA. Severe focal stenosis within the proximal P2 right PCA. Moderate/severe focal stenoses within the left posterior communicating artery and P2 left PCA (series 15, image 50). The right posterior communicating artery is hypoplastic or absent. Venous sinuses: Within limitations of contrast timing, no convincing thrombus. Anatomic variants: As described Review of the MIP images confirms the above findings IMPRESSION: CT head: 1. A punctate acute infarct within the right basal ganglia/posterior limb of right internal capsule was better appreciated on the brain MRI performed earlier the same day. 2. 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 3. Redemonstrated small chronic infarct within the left cerebellum. CTA neck: The common carotid, internal carotid and vertebral arteries are patent within the neck without hemodynamically significant stenosis. Mild calcified plaque within the carotid bifurcations/proximal ICAs bilaterally. This includes a focus of ulcerated plaque within the posterior aspect of the left carotid bulb. CTA head: 1. No intracranial large vessel occlusion. 2. Intracranial atherosclerotic disease with multifocal stenoses, most notably as follows. 3. Mild-to-moderate segmental narrowing of the proximal to mid  M1 left MCA. 4. High-grade focal stenoses within multiple distal M2 left MCA branch vessels. 5. Moderate/severe segmental stenosis within the A2 right anterior cerebral artery. 6. Moderate/severe focal stenosis within the P1 right PCA 7. Severe focal stenosis within the P2 right PCA. 8. The left PCA is fetal in origin. Moderate/severe focal stenoses within the left posterior communicating artery and P2 left PCA. 9. 2 mm vascular protrusion arising from the paraclinoid right ICA which may reflect an infundibulum or small aneurysm. Electronically Signed   By: Jackey Loge DO   On: 04/06/2020 14:34   CT Cervical Spine Wo Contrast  Result Date: 04/05/2020 CLINICAL DATA:  Trauma EXAM: CT HEAD WITHOUT CONTRAST CT CERVICAL SPINE WITHOUT CONTRAST TECHNIQUE: Multidetector CT imaging of the head and cervical spine was performed following the standard protocol without intravenous contrast. Multiplanar CT image reconstructions of the cervical spine were also generated. COMPARISON:  01/18/2015 head CT. FINDINGS: CT HEAD FINDINGS Brain: Age indeterminate focal hypodensities involving the left basal ganglia (3:14) and superior left cerebellum (7:46, new since prior exam. Additional scattered and confluent hypodense foci involving the periventricular and subcortical white matter are nonspecific however may reflect chronic microvascular ischemic changes. No mass lesion. No midline shift, ventriculomegaly or extra-axial fluid collection. Mild cerebral atrophy with ex vacuo dilatation. Vascular: No hyperdense vessel or unexpected calcification. Skull: Negative for fracture or focal lesion. Sinuses/Orbits: Normal orbits. Clear paranasal sinuses. No mastoid effusion. Other: None. CT CERVICAL SPINE FINDINGS Alignment: Straightening of lordosis.  No listhesis.  Skull base and vertebrae: No acute fracture. No primary bone lesion or focal pathologic process. Soft tissues and spinal canal: No prevertebral fluid or swelling. No visible  canal hematoma. Disc levels: Multilevel spondylosis most prominent at the C5-7 levels. Patent bony spinal canal. Multilevel mild-to-moderate bony neural foraminal narrowing secondary to uncovertebral and facet hypertrophy. Upper chest: Clear lung apices. Other: None. IMPRESSION: Focal hypodensities involving the left basal ganglia and superior left cerebellum are age indeterminate. Consider MRI brain to exclude subacute insults. Mild cerebral atrophy and chronic microvascular ischemic changes. No acute fracture or traumatic listhesis.  Multilevel spondylosis. These results were called by telephone at the time of interpretation on 04/05/2020 at 10:51 am to provider [facility] , who verbally acknowledged these results. Electronically Signed   By: Stana Bunting M.D.   On: 04/05/2020 11:02   MR Brain Wo Contrast (neuro protocol)  Result Date: 04/05/2020 CLINICAL DATA:  59 year old male status post trauma, neurologic deficit with left side weakness. Age indeterminate ischemia in the left basal ganglia and left cerebellum on plain head CT earlier today. EXAM: MRI HEAD WITHOUT CONTRAST TECHNIQUE: Multiplanar, multiecho pulse sequences of the brain and surrounding structures were obtained without intravenous contrast. COMPARISON:  Head CT 1029 hours today. FINDINGS: Brain: Questionable punctate focus of restricted diffusion at the dorsal right lentiform near the posterior limb of the right internal capsule, occurring in an area of chronic small vessel ischemia and encephalomalacia (series 5, image 80 and series 8, image 15). No other restricted diffusion. 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 Moderate patchy T2 hyperintensity in the right paracentral pons also most resembles chronic small-vessel  ischemia. No cortical encephalomalacia or definite chronic blood products. No midline shift, mass effect, evidence of mass lesion, ventriculomegaly, extra-axial collection or acute intracranial hemorrhage. Cervicomedullary junction and pituitary are within normal limits. Vascular: Major intracranial vascular flow voids are preserved. Skull and upper cervical spine: Negative for age visible cervical spine, bone marrow signal. Sinuses/Orbits: Negative orbits. Paranasal sinuses and mastoids are stable and well pneumatized. Other: There is a small volume of fluid trapped in the left petrous apex (series 10, image 12) with facilitated diffusion and no complicating features. Otherwise grossly normal visible internal auditory structures. Scalp and face appear negative. IMPRESSION: 1. Suspect punctate acute on chronic lacunar infarct at the dorsal right lentiform / posterior limb of the right internal capsule. No associated hemorrhage or mass effect. 2. Underlying advanced chronic small vessel disease, with chronic ischemia in the left basal ganglia and the left cerebellum SCA territory corresponding to head CT findings earlier today. Electronically Signed   By: Odessa Fleming M.D.   On: 04/05/2020 13:44   DG Knee Complete 4 Views Left  Result Date: 04/05/2020 CLINICAL DATA:  Left knee pain after fall. EXAM: LEFT KNEE - COMPLETE 4+ VIEW COMPARISON:  None. FINDINGS: No evidence of fracture, dislocation, or joint effusion. No evidence of arthropathy or other focal bone abnormality. Soft tissues are unremarkable. IMPRESSION: Negative. Electronically Signed   By: Lupita Raider M.D.   On: 04/05/2020 11:03   DG Foot Complete Left  Result Date: 04/11/2020 CLINICAL DATA:  LEFT great toe pain, no injury EXAM: LEFT FOOT - COMPLETE 3+ VIEW COMPARISON:  None FINDINGS: Osseous demineralization. Joint spaces preserved. No acute fracture, dislocation, or bone destruction.  IMPRESSION: No acute abnormalities. Electronically Signed   By:  Ulyses Southward M.D.   On: 04/11/2020 14:36   ECHOCARDIOGRAM COMPLETE  Result Date: 04/06/2020    ECHOCARDIOGRAM REPORT   Patient Name:   [name] Date of Exam: 04/06/2020 Medical Rec #:  [id number]      Height:       69.0 in Accession #:    [id number]     Weight:       175.0 lb Date of Birth:  [birth year]       BSA:          1.952 m Patient Age:    59 years       BP:           125/70 mmHg Patient Gender: M              HR:           68 bpm. Exam Location:  Inpatient Procedure: 2D Echo, Cardiac Doppler, Color Doppler and Strain Analysis Indications:    Stroke  History:        Patient has prior history of Echocardiogram examinations, most                 recent 02/29/2016. Stroke, Signs/Symptoms:Chest Pain; Risk                 Factors:Diabetes and Dyslipidemia. Cocaine use.  Sonographer:    Sheralyn Boatman RDCS Referring Phys: [id number] RONDELL A SMITH  Sonographer Comments: Technically difficult study due to poor echo windows. Global longitudinal strain was attempted. IMPRESSIONS  1. Left ventricular ejection fraction, by estimation, is 55 to 60%. The left ventricle has normal function. The left ventricle has no regional wall motion abnormalities. There is moderate left ventricular hypertrophy. Left ventricular diastolic parameters are consistent with Grade II diastolic dysfunction (pseudonormalization). The average left ventricular global longitudinal strain is -18.1 %.  2. Right ventricular systolic function is normal. The right ventricular size is normal.  3. The mitral valve is grossly normal. Trivial mitral valve regurgitation.  4. The aortic valve is grossly normal. Aortic valve regurgitation is not visualized. No aortic stenosis is present. FINDINGS  Left Ventricle: Left ventricular ejection fraction, by estimation, is 55 to 60%. The left ventricle has normal function. The left ventricle has no regional wall motion abnormalities. The average left ventricular global longitudinal strain is -18.1 %. The left  ventricular internal cavity size was normal in size. There is moderate left ventricular hypertrophy. Left ventricular diastolic parameters are consistent with Grade II diastolic dysfunction (pseudonormalization). Right Ventricle: The right ventricular size is normal. No increase in right ventricular wall thickness. Right ventricular systolic function is normal. Left Atrium: Left atrial size was normal in size. Right Atrium: Right atrial size was normal in size. Pericardium: There is no evidence of pericardial effusion. Mitral Valve: The mitral valve is grossly normal. Trivial mitral valve regurgitation. Tricuspid Valve: The tricuspid valve is grossly normal. Tricuspid valve regurgitation is trivial. Aortic Valve: The aortic valve is grossly normal. Aortic valve regurgitation is not visualized. No aortic stenosis is present. Pulmonic Valve: The pulmonic valve was normal in structure. Pulmonic valve regurgitation is not visualized. Aorta: The aortic root and ascending aorta are structurally normal, with no evidence of dilitation. IAS/Shunts: The atrial septum is grossly normal.  LEFT VENTRICLE PLAX 2D LVIDd:         3.60 cm     Diastology LVIDs:         2.50 cm  LV e' medial:    7.07 cm/s LV PW:         1.70 cm     LV E/e' medial:  9.5 LV IVS:        1.30 cm     LV e' lateral:   10.60 cm/s LVOT diam:     1.90 cm     LV E/e' lateral: 6.3 LV SV:         45 LV SV Index:   23          2D Longitudinal Strain LVOT Area:     2.84 cm    2D Strain GLS Avg:     -18.1 %  LV Volumes (MOD) LV vol d, MOD A2C: 47.7 ml LV vol d, MOD A4C: 71.2 ml LV vol s, MOD A2C: 23.2 ml LV vol s, MOD A4C: 29.2 ml LV SV MOD A2C:     24.5 ml LV SV MOD A4C:     71.2 ml LV SV MOD BP:      32.5 ml RIGHT VENTRICLE             IVC RV S prime:     10.90 cm/s  IVC diam: 1.80 cm TAPSE (M-mode): 1.9 cm LEFT ATRIUM             Index      RIGHT ATRIUM           Index LA diam:        4.00 cm 2.05 cm/m RA Area:     11.70 cm LA Vol (A2C):   13.0 ml 6.66  ml/m RA Volume:   25.20 ml  12.91 ml/m LA Vol (A4C):   18.2 ml 9.32 ml/m LA Biplane Vol: 16.8 ml 8.61 ml/m  AORTIC VALVE LVOT Vmax:   89.20 cm/s LVOT Vmean:  63.900 cm/s LVOT VTI:    0.159 m  AORTA Ao Root diam: 3.30 cm Ao Asc diam:  3.20 cm MITRAL VALVE MV Area (PHT): 2.73 cm    SHUNTS MV Decel Time: 278 msec    Systemic VTI:  0.16 m MV E velocity: 67.20 cm/s  Systemic Diam: 1.90 cm MV A velocity: 50.00 cm/s MV E/A ratio:  1.34 Kristeen Miss MD Electronically signed by Kristeen Miss MD Signature Date/Time: 04/06/2020/12:47:27 PM    Final    VAS US CAROTID  Result Date: 04/06/2020 Carotid Arterial Duplex Study Indications:       CVA. Risk Factors:      Prior CVA. Comparison Study:  No prior studies. Performing Technologist: Chanda Busing RVT  Examination Guidelines: A complete evaluation includes B-mode imaging, spectral Doppler, color Doppler, and power Doppler as needed of all accessible portions of each vessel. Bilateral testing is considered an integral part of a complete examination. Limited examinations for reoccurring indications may be performed as noted.  Right Carotid Findings: +----------+--------+--------+--------+-----------------------+--------+             PSV cm/s EDV cm/s Stenosis Plaque Description      Comments  +----------+--------+--------+--------+-----------------------+--------+  CCA Prox   85       17                smooth and heterogenous           +----------+--------+--------+--------+-----------------------+--------+  CCA Distal 42       14                smooth and heterogenous           +----------+--------+--------+--------+-----------------------+--------+  ICA Prox   38       16                smooth and heterogenous           +----------+--------+--------+--------+-----------------------+--------+  ICA Distal 58       21                                                  +----------+--------+--------+--------+-----------------------+--------+  ECA        87       16                                                   +----------+--------+--------+--------+-----------------------+--------+ +----------+--------+-------+--------+-------------------+             PSV cm/s EDV cms Describe Arm Pressure (mmHG)  +----------+--------+-------+--------+-------------------+  Subclavian 121                                            +----------+--------+-------+--------+-------------------+ +---------+--------+--+--------+--+---------+  Vertebral PSV cm/s 32 EDV cm/s 13 Antegrade  +---------+--------+--+--------+--+---------+  Left Carotid Findings: +----------+--------+--------+--------+-----------------------+--------+             PSV cm/s EDV cm/s Stenosis Plaque Description      Comments  +----------+--------+--------+--------+-----------------------+--------+  CCA Prox   75       15                smooth and heterogenous           +----------+--------+--------+--------+-----------------------+--------+  CCA Distal 31       8                 smooth and heterogenous           +----------+--------+--------+--------+-----------------------+--------+  ICA Prox   37       14                smooth and heterogenous           +----------+--------+--------+--------+-----------------------+--------+  ICA Distal 84       36                                                  +----------+--------+--------+--------+-----------------------+--------+  ECA        77       18                                                  +----------+--------+--------+--------+-----------------------+--------+ +----------+--------+--------+--------+-------------------+             PSV cm/s EDV cm/s Describe Arm Pressure (mmHG)  +----------+--------+--------+--------+-------------------+  Subclavian 83                                              +----------+--------+--------+--------+-------------------+ +---------+--------+--+--------+--+---------+  Vertebral PSV cm/s 46 EDV cm/s 14 Antegrade   +---------+--------+--+--------+--+---------+   Summary: Right Carotid: Velocities in the right ICA are consistent with a 1-39% stenosis. Left Carotid: Velocities in the left ICA are consistent with a 1-39% stenosis. Vertebrals: Bilateral vertebral arteries demonstrate antegrade flow. *See table(s) above for measurements and observations.  Electronically signed by Sherald Hess MD on 04/06/2020 at 4:37:52 PM.    Final     Microbiology: Recent Results (from the past 240 hour(s))  SARS Coronavirus 2 by RT PCR (hospital order, performed in [facility] hospital lab) Nasopharyngeal Nasopharyngeal Swab     Status: None   Collection Time: 04/06/20  5:54 PM   Specimen: Nasopharyngeal Swab  Result Value Ref Range Status   SARS Coronavirus 2 NEGATIVE NEGATIVE Final    Comment: (NOTE) SARS-CoV-2 target nucleic acids are NOT DETECTED.  The SARS-CoV-2 RNA is generally detectable in upper and lower respiratory specimens during the acute phase of infection. The lowest concentration of SARS-CoV-2 viral copies this assay can detect is 250 copies / mL. A negative result does not preclude SARS-CoV-2 infection and should not be used as the sole basis for treatment or other patient management decisions.  A negative result may occur with improper specimen collection / handling, submission of specimen other than nasopharyngeal swab, presence of viral mutation(s) within the areas targeted by this assay, and inadequate number of viral copies (<250 copies / mL). A negative result must be combined with clinical observations, patient history, and epidemiological information.  Fact Sheet for Patients:   BoilerBrush.com.cy  Fact Sheet for Healthcare Providers: https://pope.com/  This test is not yet approved or  cleared by the [place] FDA and has been authorized for detection and/or diagnosis of SARS-CoV-2 by FDA under an Emergency Use Authorization (EUA).   This EUA will remain in effect (meaning this test can be used) for the duration of the COVID-19 declaration under Section 564(b)(1) of the Act, 21 U.S.C. section 360bbb-3(b)(1), unless the authorization is terminated or revoked sooner.  Performed at [facility], [address], [place], [place] [address]      Labs: Basic Metabolic Panel: Recent Labs  Lab 04/12/20 0851 04/14/20 0951  K 4.8  --   MG 1.5* 1.9   Liver Function Tests: No results for input(s): AST, ALT, ALKPHOS, BILITOT, PROT, ALBUMIN in the last 168 hours. No results for input(s): LIPASE, AMYLASE in the last 168 hours. No results for input(s): AMMONIA in the last 168 hours. CBC: No results for input(s): WBC, NEUTROABS, HGB, HCT, MCV, PLT in the last 168 hours. Cardiac Enzymes: No results for input(s): CKTOTAL, CKMB, CKMBINDEX, TROPONINI in the last 168 hours. BNP: BNP (last 3 results) No results for input(s): BNP in the last 8760 hours.  ProBNP (last 3 results) No results for input(s): PROBNP in the last 8760 hours.  CBG: Recent Labs  Lab 04/14/20 1055 04/14/20 1616 04/14/20 2115 04/15/20 0851 04/15/20 1247  GLUCAP 251* 205* 297* 198* 193*       Signed:  Darlin Drop, MD [facility] 04/15/2020, 1:34 PM

## 2020-04-15 NOTE — TOC Progression Note (Addendum)
Transition of Care [facility]) - Progression Note    Patient Details  Name: [name] MRN: [id number] Date of Birth: 1961-04-20  [facility] CM/SW Contact  Elliot Gault, LCSW Phone Number: 04/15/2020, 12:01 PM  Clinical Narrative:     TOC following. Contacted Admissions at [facility] to inquire on bed availability for pt. Admissions staff stated that there is some information missing from the referral packet that was faxed to them. Per admissions, pt needs to have an authorization number from [employer] in the referring county. Pt resides in [place]. Once this information is completed and faxed to [facility], it will be reviewed by the physician for admission determination.  [facility] not available on the weekend. Assigned TOC on Monday will follow up.   1456: Spoke with pt by phone to update on above at request of RN. Pt expressed understanding.  Expected Discharge Plan: Skilled Nursing Facility Barriers to Discharge: Continued Medical Work up  Expected Discharge Plan and Services Expected Discharge Plan: Skilled Nursing Facility In-house Referral: Clinical Social Work Discharge Planning Services: CM Consult Post Acute Care Choice: Skilled Nursing Facility Living arrangements for the past 2 months: Single Family Home Expected Discharge Date: 04/14/20                                     Social Determinants of Health (SDOH) Interventions    Readmission Risk Interventions No flowsheet data found.

## 2020-04-15 NOTE — Progress Notes (Signed)
Patient stated that the Tylenol and Motrin do not help his leg pain. PCP was notified for any new orders.

## 2020-04-15 NOTE — Plan of Care (Signed)
  Problem: Clinical Measurements: Goal: Ability to maintain clinical measurements within normal limits will improve Outcome: Progressing Goal: Will remain free from infection Outcome: Progressing   Problem: Clinical Measurements: Goal: Ability to maintain clinical measurements within normal limits will improve Outcome: Progressing   Problem: Clinical Measurements: Goal: Will remain free from infection Outcome: Progressing

## 2020-04-16 LAB — GLUCOSE, CAPILLARY
Glucose-Capillary: 317 mg/dL — ABNORMAL HIGH (ref 70–99)
Glucose-Capillary: 126 mg/dL — ABNORMAL HIGH (ref 70–99)
Glucose-Capillary: 135 mg/dL — ABNORMAL HIGH (ref 70–99)
Glucose-Capillary: 190 mg/dL — ABNORMAL HIGH (ref 70–99)
Glucose-Capillary: 231 mg/dL — ABNORMAL HIGH (ref 70–99)

## 2020-04-16 MED ORDER — TRAMADOL HCL 50 MG PO TABS
50.0000 mg | ORAL_TABLET | Freq: Two times a day (BID) | ORAL | Status: DC | PRN
  Administered 2020-04-16 – 2020-04-18 (×3): 50 mg via ORAL
  Filled 2020-04-16 (×3): qty 1

## 2020-04-16 MED ORDER — GABAPENTIN 100 MG PO CAPS
200.0000 mg | ORAL_CAPSULE | Freq: Two times a day (BID) | ORAL | 0 refills | Status: DC
Start: 2020-04-16 — End: 2020-04-18

## 2020-04-16 MED ORDER — GABAPENTIN 100 MG PO CAPS
200.0000 mg | ORAL_CAPSULE | Freq: Two times a day (BID) | ORAL | Status: DC
  Administered 2020-04-16 – 2020-04-18 (×5): 200 mg via ORAL
  Filled 2020-04-16 (×5): qty 2

## 2020-04-16 NOTE — Progress Notes (Signed)
Physical Therapy Treatment Patient Details Name: [name] MRN: [id number] DOB: 12-18-1960 Today's Date: 04/16/2020    History of Present Illness 59 year old male with history of diabetes mellitus type 2, hepatitis C, bipolar disorder presented with suicidal ideations and falls.  He reportedly attempted to place a gun to his head and pulled the trigger, but it was jammed.  Patient did admit to recently using cocaine and alcohol 6 days ago.  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    PT Comments    Pt ambulated around unit with rollator and supervision.  Pt also able to perform LE standing exercises.  Pt would benefit from HHPT upon d/c.  Follow Up Recommendations  Home health PT (if not d/c to psych facility then pt would benefit from HHPT)     Equipment Recommendations  Other (comment) (has 4WW in room)    Recommendations for Other Services       Precautions / Restrictions Precautions Precautions: Fall Precaution Comments: s/p CVA mild/slight L hemi Restrictions Weight Bearing Restrictions: No    Mobility  Bed Mobility Overal bed mobility: Modified Independent                Transfers Overall transfer level: Modified independent Equipment used: None                Ambulation/Gait Ambulation/Gait assistance: Supervision Gait Distance (Feet): 600 Feet Assistive device: 4-wheeled walker Gait Pattern/deviations: Step-through pattern;Decreased stance time - left Gait velocity: decreased   General Gait Details: slow but steady with RW   Stairs             Wheelchair Mobility    Modified Rankin (Stroke Patients Only)       Balance                                            Cognition Arousal/Alertness: Awake/alert Behavior During Therapy: WFL for tasks assessed/performed Overall Cognitive Status:  Within Functional Limits for tasks assessed                                 General Comments: pleasant and motivated for d/c      Exercises General Exercises - Lower Extremity Hip ABduction/ADduction: AROM;Both;10 reps;Standing (all exercises performed with UE support) Hip Flexion/Marching: AROM;Both;10 reps;Standing Heel Raises: AROM;Both;10 reps;Standing Mini-Sqauts: Both;AROM;Standing;10 reps    General Comments        Pertinent Vitals/Pain Pain Assessment: Faces Faces Pain Scale: Hurts little more Pain Location: left leg Pain Descriptors / Indicators: Grimacing;Sore Pain Intervention(s): Repositioned;Monitored during session    Home Living                      Prior Function            PT Goals (current goals can now be found in the care plan section) Progress towards PT goals: Progressing toward goals    Frequency    Min 3X/week      PT Plan Current plan remains appropriate    Co-evaluation              AM-PAC PT "6 Clicks" Mobility   Outcome Measure  Help needed turning from your back to your side while  in a flat bed without using bedrails?: None Help needed moving from lying on your back to sitting on the side of a flat bed without using bedrails?: None Help needed moving to and from a bed to a chair (including a wheelchair)?: None Help needed standing up from a chair using your arms (e.g., wheelchair or bedside chair)?: None Help needed to walk in hospital room?: A Little Help needed climbing 3-5 steps with a railing? : A Little 6 Click Score: 22    End of Session   Activity Tolerance: Patient tolerated treatment well Patient left: with call bell/phone within reach;in bed Nurse Communication: Mobility status PT Visit Diagnosis: Other abnormalities of gait and mobility (R26.89);Repeated falls (R29.6)     Time: 3403-5248 PT Time Calculation (min) (ACUTE ONLY): 17 min  Charges:  $Gait Training: 8-22 mins                     Paulino Door, DPT Acute Rehabilitation Services Pager: [phone] Office: [phone]  Maida Sale E 04/16/2020, 10:32 AM

## 2020-04-16 NOTE — TOC Progression Note (Signed)
Transition of Care [facility]) - Progression Note    Patient Details  Name: Adler Chartrand MRN: [id number] Date of Birth: 11-Jun-1961  Transition of Care [facility]) CM/SW Contact  Armanda Heritage, RN Phone Number: 04/16/2020, 10:50 AM  Clinical Narrative:   CM spoke with [employer] regarding need for authorization for psychiatric hospital stay.  Regional referral form faxed to [employer] for review and authorization.     Expected Discharge Plan: Skilled Nursing Facility Barriers to Discharge: Continued Medical Work up  Expected Discharge Plan and Services Expected Discharge Plan: Skilled Nursing Facility In-house Referral: Clinical Social Work Discharge Planning Services: CM Consult Post Acute Care Choice: Skilled Nursing Facility Living arrangements for the past 2 months: Single Family Home Expected Discharge Date: 04/16/20                                     Social Determinants of Health (SDOH) Interventions    Readmission Risk Interventions No flowsheet data found.

## 2020-04-16 NOTE — TOC Progression Note (Signed)
Transition of Care [facility]) - Progression Note    Patient Details  Name: [name] MRN: [id number] Date of Birth: Oct 02, 1960  Transition of Care [facility]) CM/SW Contact  Armanda Heritage, RN Phone Number: 04/16/2020, 9:30 AM  Clinical Narrative:   CM reached out to [employer] and left a voicemail requesting call back regarding need for authorization for patient to go to [facility].  CM will wait for call back.    Expected Discharge Plan: Skilled Nursing Facility Barriers to Discharge: Continued Medical Work up  Expected Discharge Plan and Services Expected Discharge Plan: Skilled Nursing Facility In-house Referral: Clinical Social Work Discharge Planning Services: CM Consult Post Acute Care Choice: Skilled Nursing Facility Living arrangements for the past 2 months: Single Family Home Expected Discharge Date: 04/16/20                                     Social Determinants of Health (SDOH) Interventions    Readmission Risk Interventions No flowsheet data found.

## 2020-04-16 NOTE — Progress Notes (Signed)
30 Day Passar Note  RE:  [id number]       Date of Birth: 06/02/1961    Date: 04/16/2020       To Whom It May Concern:  Please be advised that the above-named patient will require a short-term nursing home stay - anticipated 30 days or less for rehabilitation and strengthening.  The plan is for return home.   Geni Bers, RN, BSN [phone]  MD signature: _________________________

## 2020-04-16 NOTE — Plan of Care (Signed)
  Problem: Clinical Measurements: Goal: Ability to maintain clinical measurements within normal limits will improve Outcome: Progressing Goal: Will remain free from infection Outcome: Progressing Goal: Diagnostic test results will improve Outcome: Progressing   Problem: Activity: Goal: Risk for activity intolerance will decrease Outcome: Progressing   Problem: Nutrition: Goal: Adequate nutrition will be maintained Outcome: Progressing   Problem: Pain Managment: Goal: General experience of comfort will improve Outcome: Progressing   Problem: Safety: Goal: Ability to remain free from injury will improve Outcome: Progressing

## 2020-04-16 NOTE — [place] FL2 (Signed)
[place] MEDICAID FL2 LEVEL OF CARE SCREENING TOOL     IDENTIFICATION  Patient Name: [name] Birthdate: 1961/03/07 Sex: male Admission Date (Current Location): 04/05/2020  [facility] and [place] Number:  Producer, television/film/video and Address:  [facility],  [address] [place], [place] [address]      Provider Number: [id number]  Attending Physician Name and Address:  Darlin Drop, DO  Relative Name and Phone Number:  Richard Ritchey [phone]    Current Level of Care: Hospital Recommended Level of Care: Skilled Nursing Facility Prior Approval Number:    Date Approved/Denied:   PASRR Number:    Discharge Plan: SNF    Current Diagnoses: Patient Active Problem List   Diagnosis Date Noted  . CVA (cerebral vascular accident) (HCC) 04/05/2020  . Frequent falls 04/05/2020  . Cocaine use disorder (HCC) 01/02/2020  . 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 Homelessness 01/02/2020  . Cocaine use disorder, moderate, dependence (HCC) 10/29/2016  . DKA (diabetic ketoacidoses) (HCC) 02/28/2016  . Chest pain 02/28/2016  . AKI (acute kidney injury) (HCC) 02/28/2016  . Suicidal ideation 02/06/2016  . Cannabis use disorder, moderate, dependence (HCC) 02/06/2016  . Schizoaffective disorder, bipolar type (HCC) 02/05/2016  . Hyperlipidemia 01/18/2015  . Hyperprolactinemia (HCC) 01/18/2015  . Diabetes mellitus (HCC) 03/11/2012  . HTN (hypertension) 03/11/2012    Orientation RESPIRATION BLADDER Height & Weight     Self, Time, Situation, Place  Normal Continent Weight: 75.3 kg Height:  5\' 9"  (175.3 cm)  BEHAVIORAL SYMPTOMS/MOOD NEUROLOGICAL BOWEL NUTRITION STATUS      Continent Diet (Mod Carb)  AMBULATORY STATUS COMMUNICATION OF NEEDS Skin   Limited Assist (Left CVA, use Rolling Walker) Verbally Normal                       Personal Care Assistance Level of Assistance  Bathing, Feeding, Dressing Bathing Assistance: Independent Feeding  assistance: Independent Dressing Assistance: Limited assistance     Functional Limitations Info  Sight, Hearing, Speech Sight Info: Adequate Hearing Info: Adequate Speech Info: Adequate    SPECIAL CARE FACTORS FREQUENCY  PT (By licensed PT), OT (By licensed OT)     PT Frequency: 5x week OT Frequency: 5x week            Contractures Contractures Info: Not present    Additional Factors Info  Code Status, Allergies, Psychotropic (Aripiprazole 7.5mg  every day) Code Status Info: FULL Allergies Info: Wellbutrin, Bupropion Psychotropic Info: Anxiety, Bipolar disorder, Schizophrenia, schizo-affective, Depression         Current Medications (04/16/2020):  This is the current hospital active medication list Current Facility-Administered Medications  Medication Dose Route Frequency Provider Last Rate Last Admin  . acetaminophen (TYLENOL) tablet 650 mg  650 mg Oral Q4H PRN 04/18/2020 A, MD   650 mg at 04/08/20 0905   Or  . acetaminophen (TYLENOL) 160 MG/5ML solution 650 mg  650 mg Per Tube Q4H PRN 06/08/20 A, MD       Or  . acetaminophen (TYLENOL) suppository 650 mg  650 mg Rectal Q4H PRN Smith, Rondell A, MD      . ARIPiprazole (ABILIFY) tablet 7.5 mg  7.5 mg Oral Daily Madelyn Flavors, FNP   7.5 mg at 04/16/20 04/18/20  . aspirin suppository 300 mg  300 mg Rectal Daily Smith, Rondell A, MD       Or  . aspirin tablet 325 mg  325 mg Oral Daily Smith, Rondell A,  MD   325 mg at 04/16/20 0922  . atorvastatin (LIPITOR) tablet 80 mg  80 mg Oral Daily Madelyn Flavors A, MD   80 mg at 04/16/20 0923  . clopidogrel (PLAVIX) tablet 75 mg  75 mg Oral Daily Glade Lloyd, MD   75 mg at 04/16/20 0922  . enoxaparin (LOVENOX) injection 40 mg  40 mg Subcutaneous Q24H Smith, Rondell A, MD   40 mg at 04/15/20 1715  . folic acid (FOLVITE) tablet 1 mg  1 mg Oral Daily Katrinka Blazing, Rondell A, MD   1 mg at 04/16/20 0924  . gabapentin (NEURONTIN) capsule 200 mg  200 mg Oral BID Dow Adolph N, DO    200 mg at 04/16/20 5631  . ibuprofen (ADVIL) tablet 400 mg  400 mg Oral Q6H PRN Glade Lloyd, MD   400 mg at 04/15/20 2205  . insulin aspart (novoLOG) injection 0-15 Units  0-15 Units Subcutaneous TID WC Clydie Braun, MD   2 Units at 04/16/20 0810  . insulin aspart (novoLOG) injection 0-5 Units  0-5 Units Subcutaneous QHS Clydie Braun, MD   4 Units at 04/15/20 2106  . insulin aspart (novoLOG) injection 8 Units  8 Units Subcutaneous TID WC Glade Lloyd, MD   8 Units at 04/16/20 0810  . insulin detemir (LEVEMIR) injection 45 Units  45 Units Subcutaneous QHS Glade Lloyd, MD   45 Units at 04/15/20 2107  . multivitamin with minerals tablet 1 tablet  1 tablet Oral Daily Madelyn Flavors A, MD   1 tablet at 04/16/20 4970  . senna-docusate (Senokot-S) tablet 1 tablet  1 tablet Oral QHS PRN Madelyn Flavors A, MD      . thiamine tablet 100 mg  100 mg Oral Daily Katrinka Blazing, Rondell A, MD   100 mg at 04/16/20 2637   Or  . thiamine (B-1) injection 100 mg  100 mg Intravenous Daily Smith, Rondell A, MD      . traMADol (ULTRAM) tablet 50 mg  50 mg Oral Q12H PRN Dow Adolph N, DO      . traZODone (DESYREL) tablet 100 mg  100 mg Oral QHS Akintayo, Mojeed, MD   100 mg at 04/15/20 2107     Discharge Medications: Please see discharge summary for a list of discharge medications.  Relevant Imaging Results:  Relevant Lab Results:   Additional Information CH#[id number]  Geni Bers, RN

## 2020-04-16 NOTE — Discharge Summary (Signed)
Discharge Summary  [name] RUE:[id number] DOB: 05/29/1961  PCP: System, Provider Not In  Admit date: 04/05/2020 Discharge date: 04/16/2020  Time spent: 35 minutes  Recommendations for Outpatient Follow-up:  1. Follow up with neurology 2. Follow up with Psychiatry 3. Follow-up with your PCP 4. Take your medications as prescribed. 5. Continue PT OT with assistance and fall precautions.  Discharge Diagnoses:  Active Hospital Problems   Diagnosis Date Noted  . Schizoaffective disorder, bipolar type (HCC) 02/05/2016  . CVA (cerebral vascular accident) (HCC) 04/05/2020  . Frequent falls 04/05/2020  . 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 Suicidal ideation 02/06/2016  . Hyperlipidemia 01/18/2015  . Diabetes mellitus (HCC) 03/11/2012    Resolved Hospital Problems  No resolved problems to display.    Discharge Condition: Stable  Diet recommendation: Heart healthy carb modified diet.  Vitals:   04/15/20 2039 04/16/20 0432  BP: (!) 156/93 137/82  Pulse: 86 70  Resp: 16 17  Temp: 98.2 F (36.8 C) 98.1 F (36.7 C)  SpO2: 100% 100%    History of present illness:  59 year old male with history of diabetes mellitus type 2, hepatitis C, bipolar disorder presented with suicidal ideations, attempt, and falls. He reportedly attempted to place a gun to his head and pulled the trigger, but it was jammed. Patient did admit to recently using cocaine and alcohol 6 days prior to presentation. In the ED, MRI of the brain done on 04/05/2020 was significant for acute on chronic lacunar infarct at the dorsal right lentiform/posterior limb of the right internal capsule. Neurology/psychiatry were consulted.Neurology recommended aspirin and Plavix for 3 months along with Lipitor with outpatient follow-up with neurology. Psychiatry recommended inpatient psychiatric hospitalization. One-to-one sitter in place for patient's own safety.  Currently on oral therapies  and medically cleared for discharge.  Patient will be discharged to inpatient psychiatric facility once bed is available.   04/11/2020 reported left first great toe pain, x-ray complete of left foot was negative for acute abnormalities.  Currently his pain has resolved.  Patient has an extensive psychiatric history and psych team is familiar with him.  Discussed with psychiatry Dr. Jannifer Franklin.  Patient does not meet criteria for inpatient behavioral health at this time.  He is no longer suicidal.  He is on medication for his bipolar disorder and mood has been stable while on his medications.    Patient is not able to care for himself and has a poor social support.  Additionally, he needs assistance with taking his medications.  These have been complicated by his recent stroke.  He would benefit from SNF placement.    He is stable to be discharged from a medical standpoint.  We will continue to seek SNF placement.  We appreciate psychiatry's assistance with the care of this patient and TOC's assistance with SNF placement.    04/16/2020: Seen and examined.  No new complaints.  Awaiting bed placement.  Patient is medically cleared to discharge to SNF.  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 -Continue high dose statin Lipitor 80 mg daily -PT recommended SNF. Tolerating diet as per SLP  recommendations. -LDL 171, goal LDL less than 70. Hemoglobin A1c 14.2, goal A1c less than 7.0. -Carotid ultrasound showed bilateral 1 to 39% ICA stenosis. 2D echo showed EF of 55 to 60% with grade 2 diastolic dysfunction -Neurology has signed off.  -Outpatient follow-up with neurology  Moderate to high-grade focal stenosis in multiple intracerebral vessels -Most likely atherosclerotic but could reflect sequelae of cocaine use as well as per neurology. Outpatient follow-up  Suicidal attempt/schizoaffective /bipolar disorder Recent suicidal attempt with a gun He reportedly attempted to place a gun to his head and pulled the trigger, but it jammed. Continue 1-to-1 sitter May not be a safe discharge to the community at this time due to the severity of the previous attempt. Plan to dc to inpatient behavioral health Continue Abilify 7.5 mg daily and trazodone 100 mg qhs as recommended by psych. Awaiting bed placement to SNF  Resolved post repletion: Hypomagnesemia Repleted intravenously Mg2+ 1.9 on 04/14/20  Resolved left great toe pain Reports left great toe pain.  Denies prior hx of gout.  X-ray negative for acute abnormalities. Analgesic as needed.  Twomm vascular protrusion arising fromRICA/CCAbifurcation-infundibulum versus small aneurysm. Outpatient follow-up with repeat CTA in a year as per neurology Follow-up with neurology outpatient.  Diabetes mellitus type 2 uncontrolled with hyperglycemia -A1c 14.2. Continue Levemir along with Humalog. Carb modified diet.  Resume Metformin at discharge.  Outpatient follow-up with PCP  Recurrent falls/ambulatory dysfunction -PT recommends SNF.He will likely need to go to SNF from inpatient psychiatric hospital.  Continue PT OT with assistance and fall precautions  Hyperlipidemia LDL 171, goal less than 70. High intensity statin, Lipitor 80 mg daily. Follow-up with your PCP  Polysubstance abuse -Longstanding  history of cocaine/alcohol abuse.  Urine drug screen was positive for cocaine on 04/05/20.  Polysubstance cessation counseling.     Code Status: Full     Consultants:  Psychiatry  Procedures:  None  Antimicrobials:  None  DVT prophylaxis:  None   Disposition Plan:   Status is: Inpatient    Dispo: The patient is from: Home  Anticipated d/c is to: Psych ward   Anticipated d/c date is: 04/16/20  Patient currently medically stable for discharge.   Discharge Exam: No significant changes from prior exam. BP 137/82 (BP Location: Right Arm)   Pulse 70   Temp 98.1 F (36.7 C) (Oral)   Resp 17   Ht 5\' 9"  (1.753 m)   Wt 75.3 kg   SpO2 100%   BMI 24.51 kg/m  . General: 59 y.o. year-old male Well developed well nourished in NAD. A&O x 3 . Cardiovascular: RRR no rubs or gallops..   . Respiratory: Clear to auscultation no wheezes or rales.   . Abdomen: Soft nontender normal bowel sounds present.   . Musculoskeletal: No lower extremity edema bilaterally. 46 Psychiatry: Mood is appropriate for condition.   Discharge Instructions You were cared for by a hospitalist during your hospital stay. If you have any questions about your discharge medications or the care you received while you were in the hospital after you are discharged, you can call the unit and asked to speak with the hospitalist on call if the hospitalist that took care of you is not available. Once you are discharged, your primary care physician will handle any further medical issues. Please note  that NO REFILLS for any discharge medications will be authorized once you are discharged, as it is imperative that you return to your primary care physician (or establish a relationship with a primary care physician if you do not have one) for your aftercare needs so that they can reassess your need for medications and monitor your lab values.  Discharge  Instructions    Ambulatory referral to Neurology   Complete by: As directed    An appointment is requested in approximately: 2-3 weeks for follow-up of stroke   Diet - low sodium heart healthy   Complete by: As directed    Diet Carb Modified   Complete by: As directed    Increase activity slowly   Complete by: As directed      Allergies as of 04/16/2020      Reactions   Wellbutrin [bupropion] Hives, Swelling      Medication List    STOP taking these medications   FLUoxetine 40 MG capsule Commonly known as: PROZAC   glipiZIDE 10 MG tablet Commonly known as: GLUCOTROL   hydrOXYzine 25 MG tablet Commonly known as: ATARAX/VISTARIL   QUEtiapine 200 MG tablet Commonly known as: SEROQUEL   zolpidem 5 MG tablet Commonly known as: AMBIEN     TAKE these medications   ARIPiprazole 15 MG tablet Commonly known as: ABILIFY Take 0.5 tablets (7.5 mg total) by mouth daily. What changed: how much to take   aspirin 325 MG tablet Take 1 tablet (325 mg total) by mouth daily.   atorvastatin 80 MG tablet Commonly known as: LIPITOR Take 1 tablet (80 mg total) by mouth daily.   clopidogrel 75 MG tablet Commonly known as: PLAVIX Take 1 tablet (75 mg total) by mouth daily.   folic acid 1 MG tablet Commonly known as: FOLVITE Take 1 tablet (1 mg total) by mouth daily.   gabapentin 100 MG capsule Commonly known as: NEURONTIN Take 2 capsules (200 mg total) by mouth 2 (two) times daily.   HumaLOG KwikPen 100 UNIT/ML KwikPen Generic drug: insulin lispro Inject 0-0.1 mLs (0-10 Units total) into the skin 3 (three) times daily with meals.   insulin detemir 100 UNIT/ML injection Commonly known as: LEVEMIR Inject 0.45 mLs (45 Units total) into the skin at bedtime. What changed:   how much to take  additional instructions   metFORMIN 1000 MG tablet Commonly known as: GLUCOPHAGE Take 1 tablet (1,000 mg total) by mouth 2 (two) times daily with a meal.   multivitamin with minerals  Tabs tablet Take 1 tablet by mouth daily.   thiamine 100 MG tablet Take 1 tablet (100 mg total) by mouth daily.   traZODone 100 MG tablet Commonly known as: DESYREL Take 1 tablet (100 mg total) by mouth at bedtime.            Durable Medical Equipment  (From admission, onward)         Start     Ordered   04/10/20 1439  For home use only DME 4 wheeled rolling walker with seat  Once       Question:  Patient needs a walker to treat with the following condition  Answer:  Fear for personal safety   04/10/20 1439         Allergies  Allergen Reactions  . Wellbutrin [Bupropion] Hives and Swelling    Follow-up Information    Milon Dikes, MD. Call in 1 day(s).   Specialty: Neurology Why: Please call for a post hospital follow-up appointment.  Contact information: [address] [place] [address] [phone]        [facility]. Call in 1 day(s).   Why: Please call for a post hospital follow-up appointment. Contact information: [address] [place] [address] [phone]       [facility]. Call in 1 day(s).   Why: please call for a post hospital follow up appointment. Contact information: [address]  [address] [place] [address] [phone]                The results of significant diagnostics from this hospitalization (including imaging, microbiology, ancillary and laboratory) are listed below for reference.    Significant Diagnostic Studies: CT ANGIO HEAD W OR WO CONTRAST  Result Date: 04/06/2020 CLINICAL DATA:  Stroke/TIA, assess extracranial arteries, right internal capsule stroke. EXAM: CT ANGIOGRAPHY HEAD AND NECK TECHNIQUE: Multidetector CT imaging of the head and neck was performed using the standard protocol during bolus administration of intravenous contrast. Multiplanar CT image reconstructions and MIPs were obtained to evaluate the vascular anatomy. Carotid stenosis  measurements (when applicable) are obtained utilizing NASCET criteria, using the distal internal carotid diameter as the denominator. CONTRAST:  OMNIPAQUE IOHEXOL 350 MG/ML SOLN COMPARISON:  Brain MRI performed earlier the same day 04/05/2020, prior head CT examinations 04/05/2020 and earlier. FINDINGS: CT HEAD FINDINGS Brain: A punctate acute infarct within the right basal ganglia/posterior limb of right internal capsule was better appreciated on the brain MRI performed earlier the same day. Advanced ill-defined hypoattenuation within the cerebral white matter consistent with chronic small vessel ischemic disease. Redemonstrated chronic lacunar infarcts within the bilateral corona radiata/basal ganglia and right thalamus. Also redemonstrated is a small chronic infarct within the left cerebellar hemisphere. There is no acute intracranial hemorrhage. No demarcated cortical infarct is identified. No extra-axial fluid collection. No evidence of intracranial mass. No midline shift. Vascular: Reported below. Skull: Normal. Negative for fracture or focal lesion. Sinuses: Minimal ethmoid and maxillary sinus mucosal thickening. No significant mastoid effusion. Orbits: No acute finding. Review of the MIP images confirms the above findings CTA NECK FINDINGS Aortic arch: Common origin of the innominate and left common carotid arteries. Atherosclerotic plaque within the visualized aortic arch. No hemodynamically significant innominate or proximal subclavian artery stenosis. Right carotid system: CCA and ICA patent within the neck without significant stenosis (50% or greater). Minimal mixed plaque within the prox ICA. Left carotid system: CCA and ICA patent within the neck without significant stenosis (50% or greater). Mild mixed plaque within the carotid bifurcation and proximal ICA. This includes a small focus of ulcerated plaque along the posterior aspect of the carotid bulb (series 14, image 131) Vertebral arteries:  Patent bilaterally without significant stenosis. The left vertebral artery is slightly dominant. Skeleton: No acute bony abnormality or aggressive osseous lesion. Cervical spondylosis with multilevel disc space narrowing, posterior disc osteophytes, uncovertebral and facet hypertrophy. Additionally, there are multilevel prominent ventral osteophytes most notably at the C5-C6 and C6-C7 levels. Other neck: No neck mass or cervical lymphadenopathy. Upper chest: No consolidation within the imaged lung apices. Review of the MIP images confirms the above findings CTA HEAD FINDINGS Anterior circulation: The intracranial internal carotid arteries are patent. Minimal calcified plaque within the intracranial left ICA without stenosis. The M1 middle cerebral arteries are patent without significant stenosis. Mild-to-moderate segmental narrowing of the proximal to mid M1 left MCA (series 15, image 50). No M2 proximal branch occlusion is identified. There are high-grade  focal stenoses within multiple distal M2 left MCA branch vessels (for instance as seen on series 17, image 28). The anterior cerebral arteries are patent. Moderate/severe segmental stenosis within the A2 right anterior cerebral artery (series 17, image 22). 2 mm inferiorly projecting vascular protrusion arising from the paraclinoid right ICA, which may reflect an infundibulum or small aneurysm (series 14, image 87). Posterior circulation: The intracranial vertebral arteries are patent. The basilar artery is patent. Fetal origin left posterior cerebral artery. The posterior cerebral arteries are patent bilaterally. There is a moderate focal stenosis within the P1 right PCA. Severe focal stenosis within the proximal P2 right PCA. Moderate/severe focal stenoses within the left posterior communicating artery and P2 left PCA (series 15, image 50). The right posterior communicating artery is hypoplastic or absent. Venous sinuses: Within limitations of contrast timing,  no convincing thrombus. Anatomic variants: As described Review of the MIP images confirms the above findings IMPRESSION: CT head: 1. A punctate acute infarct within the right basal ganglia/posterior limb of right internal capsule was better appreciated on the brain MRI performed earlier the same day. 2. 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 3. Redemonstrated small chronic infarct within the left cerebellum. CTA neck: The common carotid, internal carotid and vertebral arteries are patent within the neck without hemodynamically significant stenosis. Mild calcified plaque within the carotid bifurcations/proximal ICAs bilaterally. This includes a focus of ulcerated plaque within the posterior aspect of the left carotid bulb. CTA head: 1. No intracranial large vessel occlusion. 2. Intracranial atherosclerotic disease with multifocal stenoses, most notably as follows. 3. Mild-to-moderate segmental narrowing of the proximal to mid M1 left MCA. 4. High-grade focal stenoses within multiple distal M2 left MCA branch vessels. 5. Moderate/severe segmental stenosis within the A2 right anterior cerebral artery. 6. Moderate/severe focal stenosis within the P1 right PCA 7. Severe focal stenosis within the P2 right PCA. 8. The left PCA is fetal in origin. Moderate/severe focal stenoses within the left posterior communicating artery and P2 left PCA. 9. 2 mm vascular protrusion arising from the paraclinoid right ICA which may reflect an infundibulum or small aneurysm. Electronically Signed   By: Jackey Loge DO   On: 04/06/2020 14:34   DG Chest 2 View  Result Date: 04/04/2020 CLINICAL DATA:  Hyperglycemic, suicidal thoughts X 3 days. History of hypertension, diabetes, schizoaffective and bipolar disorder, hepatitis. EXAM: CHEST - 2 VIEW COMPARISON:  Chest x-ray 02/28/2016. chest x-ray  02/03/2013 FINDINGS: The heart size and mediastinal contours are within normal limits. Both lungs are clear. No acute osseous finding. Multilevel degenerative changes of the spine with chronic mild anterior wedge shaped compression deformity of a midthoracic body IMPRESSION: No active cardiopulmonary disease. Electronically Signed   By: Tish Frederickson M.D.   On: 04/04/2020 10:48   CT Head Wo Contrast  Result Date: 04/05/2020 CLINICAL DATA:  Trauma EXAM: CT HEAD WITHOUT CONTRAST CT CERVICAL SPINE WITHOUT CONTRAST TECHNIQUE: Multidetector CT imaging of the head and cervical spine was performed following the standard protocol without intravenous contrast. Multiplanar CT image reconstructions of the cervical spine were also generated. COMPARISON:  01/18/2015 head CT. FINDINGS: CT HEAD FINDINGS Brain: Age indeterminate focal hypodensities involving the left basal ganglia (3:14) and superior left cerebellum (7:46, new since prior exam. Additional scattered and confluent hypodense foci involving the periventricular and subcortical white matter are nonspecific however may reflect chronic microvascular ischemic changes. No mass lesion. No  midline shift, ventriculomegaly or extra-axial fluid collection. Mild cerebral atrophy with ex vacuo dilatation. Vascular: No hyperdense vessel or unexpected calcification. Skull: Negative for fracture or focal lesion. Sinuses/Orbits: Normal orbits. Clear paranasal sinuses. No mastoid effusion. Other: None. CT CERVICAL SPINE FINDINGS Alignment: Straightening of lordosis.  No listhesis. Skull base and vertebrae: No acute fracture. No primary bone lesion or focal pathologic process. Soft tissues and spinal canal: No prevertebral fluid or swelling. No visible canal hematoma. Disc levels: Multilevel spondylosis most prominent at the C5-7 levels. Patent bony spinal canal. Multilevel mild-to-moderate bony neural foraminal narrowing secondary to uncovertebral and facet hypertrophy. Upper  chest: Clear lung apices. Other: None. IMPRESSION: Focal hypodensities involving the left basal ganglia and superior left cerebellum are age indeterminate. Consider MRI brain to exclude subacute insults. Mild cerebral atrophy and chronic microvascular ischemic changes. No acute fracture or traumatic listhesis.  Multilevel spondylosis. These results were called by telephone at the time of interpretation on 04/05/2020 at 10:51 am to provider [facility] , who verbally acknowledged these results. Electronically Signed   By: Stana Buntinghikanele  Emekauwa M.D.   On: 04/05/2020 11:02   CT ANGIO NECK W OR WO CONTRAST  Result Date: 04/06/2020 CLINICAL DATA:  Stroke/TIA, assess extracranial arteries, right internal capsule stroke. EXAM: CT ANGIOGRAPHY HEAD AND NECK TECHNIQUE: Multidetector CT imaging of the head and neck was performed using the standard protocol during bolus administration of intravenous contrast. Multiplanar CT image reconstructions and MIPs were obtained to evaluate the vascular anatomy. Carotid stenosis measurements (when applicable) are obtained utilizing NASCET criteria, using the distal internal carotid diameter as the denominator. CONTRAST:  100mL OMNIPAQUE IOHEXOL 350 MG/ML SOLN COMPARISON:  Brain MRI performed earlier the same day 04/05/2020, prior head CT examinations 04/05/2020 and earlier. FINDINGS: CT HEAD FINDINGS Brain: A punctate acute infarct within the right basal ganglia/posterior limb of right internal capsule was better appreciated on the brain MRI performed earlier the same day. Advanced ill-defined hypoattenuation within the cerebral white matter consistent with chronic small vessel ischemic disease. Redemonstrated chronic lacunar infarcts within the bilateral corona radiata/basal ganglia and right thalamus. Also redemonstrated is a small chronic infarct within the left cerebellar hemisphere. There is no acute intracranial hemorrhage. No demarcated cortical infarct is identified. No  extra-axial fluid collection. No evidence of intracranial mass. No midline shift. Vascular: Reported below. Skull: Normal. Negative for fracture or focal lesion. Sinuses: Minimal ethmoid and maxillary sinus mucosal thickening. No significant mastoid effusion. Orbits: No acute finding. Review of the MIP images confirms the above findings CTA NECK FINDINGS Aortic arch: Common origin of the innominate and left common carotid arteries. Atherosclerotic plaque within the visualized aortic arch. No hemodynamically significant innominate or proximal subclavian artery stenosis. Right carotid system: CCA and ICA patent within the neck without significant stenosis (50% or greater). Minimal mixed plaque within the prox ICA. Left carotid system: CCA and ICA patent within the neck without significant stenosis (50% or greater). Mild mixed plaque within the carotid bifurcation and proximal ICA. This includes a small focus of ulcerated plaque along the posterior aspect of the carotid bulb (series 14, image 131) Vertebral arteries: Patent bilaterally without significant stenosis. The left vertebral artery is slightly dominant. Skeleton: No acute bony abnormality or aggressive osseous lesion. Cervical spondylosis with multilevel disc space narrowing, posterior disc osteophytes, uncovertebral and facet hypertrophy. Additionally, there are multilevel prominent ventral osteophytes most notably at the C5-C6 and C6-C7 levels. Other neck: No neck mass or cervical lymphadenopathy. Upper chest: No consolidation within the imaged lung  apices. Review of the MIP images confirms the above findings CTA HEAD FINDINGS Anterior circulation: The intracranial internal carotid arteries are patent. Minimal calcified plaque within the intracranial left ICA without stenosis. The M1 middle cerebral arteries are patent without significant stenosis. Mild-to-moderate segmental narrowing of the proximal to mid M1 left MCA (series 15, image 50). No M2 proximal  branch occlusion is identified. There are high-grade focal stenoses within multiple distal M2 left MCA branch vessels (for instance as seen on series 17, image 28). The anterior cerebral arteries are patent. Moderate/severe segmental stenosis within the A2 right anterior cerebral artery (series 17, image 22). 2 mm inferiorly projecting vascular protrusion arising from the paraclinoid right ICA, which may reflect an infundibulum or small aneurysm (series 14, image 87). Posterior circulation: The intracranial vertebral arteries are patent. The basilar artery is patent. Fetal origin left posterior cerebral artery. The posterior cerebral arteries are patent bilaterally. There is a moderate focal stenosis within the P1 right PCA. Severe focal stenosis within the proximal P2 right PCA. Moderate/severe focal stenoses within the left posterior communicating artery and P2 left PCA (series 15, image 50). The right posterior communicating artery is hypoplastic or absent. Venous sinuses: Within limitations of contrast timing, no convincing thrombus. Anatomic variants: As described Review of the MIP images confirms the above findings IMPRESSION: CT head: 1. A punctate acute infarct within the right basal ganglia/posterior limb of right internal capsule was better appreciated on the brain MRI performed earlier the same day. 2. 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 3. Redemonstrated small chronic infarct within the left cerebellum. CTA neck: The common carotid, internal carotid and vertebral arteries are patent within the neck without hemodynamically significant stenosis. Mild calcified plaque within the carotid bifurcations/proximal ICAs bilaterally. This includes a focus of ulcerated plaque within the posterior aspect of the left carotid bulb. CTA head: 1. No intracranial large  vessel occlusion. 2. Intracranial atherosclerotic disease with multifocal stenoses, most notably as follows. 3. Mild-to-moderate segmental narrowing of the proximal to mid M1 left MCA. 4. High-grade focal stenoses within multiple distal M2 left MCA branch vessels. 5. Moderate/severe segmental stenosis within the A2 right anterior cerebral artery. 6. Moderate/severe focal stenosis within the P1 right PCA 7. Severe focal stenosis within the P2 right PCA. 8. The left PCA is fetal in origin. Moderate/severe focal stenoses within the left posterior communicating artery and P2 left PCA. 9. 2 mm vascular protrusion arising from the paraclinoid right ICA which may reflect an infundibulum or small aneurysm. Electronically Signed   By: Jackey Loge DO   On: 04/06/2020 14:34   CT Cervical Spine Wo Contrast  Result Date: 04/05/2020 CLINICAL DATA:  Trauma EXAM: CT HEAD WITHOUT CONTRAST CT CERVICAL SPINE WITHOUT CONTRAST TECHNIQUE: Multidetector CT imaging of the head and cervical spine was performed following the standard protocol without intravenous contrast. Multiplanar CT image reconstructions of the cervical spine were also generated. COMPARISON:  01/18/2015 head CT. FINDINGS: CT HEAD FINDINGS Brain: Age indeterminate focal hypodensities involving the left basal ganglia (3:14) and superior left cerebellum (7:46, new since prior exam. Additional scattered and confluent hypodense foci involving the periventricular and subcortical white matter are nonspecific however may reflect chronic microvascular ischemic changes. No mass lesion. No midline shift, ventriculomegaly or extra-axial fluid collection. Mild cerebral atrophy with ex vacuo dilatation. Vascular: No hyperdense vessel or unexpected calcification. Skull: Negative for fracture or focal lesion. Sinuses/Orbits:  Normal orbits. Clear paranasal sinuses. No mastoid effusion. Other: None. CT CERVICAL SPINE FINDINGS Alignment: Straightening of lordosis.  No listhesis. Skull  base and vertebrae: No acute fracture. No primary bone lesion or focal pathologic process. Soft tissues and spinal canal: No prevertebral fluid or swelling. No visible canal hematoma. Disc levels: Multilevel spondylosis most prominent at the C5-7 levels. Patent bony spinal canal. Multilevel mild-to-moderate bony neural foraminal narrowing secondary to uncovertebral and facet hypertrophy. Upper chest: Clear lung apices. Other: None. IMPRESSION: Focal hypodensities involving the left basal ganglia and superior left cerebellum are age indeterminate. Consider MRI brain to exclude subacute insults. Mild cerebral atrophy and chronic microvascular ischemic changes. No acute fracture or traumatic listhesis.  Multilevel spondylosis. These results were called by telephone at the time of interpretation on 04/05/2020 at 10:51 am to provider [facility] , who verbally acknowledged these results. Electronically Signed   By: Stana Bunting M.D.   On: 04/05/2020 11:02   MR Brain Wo Contrast (neuro protocol)  Result Date: 04/05/2020 CLINICAL DATA:  59 year old male status post trauma, neurologic deficit with left side weakness. Age indeterminate ischemia in the left basal ganglia and left cerebellum on plain head CT earlier today. EXAM: MRI HEAD WITHOUT CONTRAST TECHNIQUE: Multiplanar, multiecho pulse sequences of the brain and surrounding structures were obtained without intravenous contrast. COMPARISON:  Head CT 1029 hours today. FINDINGS: Brain: Questionable punctate focus of restricted diffusion at the dorsal right lentiform near the posterior limb of the right internal capsule, occurring in an area of chronic small vessel ischemia and encephalomalacia (series 5, image 80 and series 8, image 15). No other restricted diffusion. Small chronic infarcts are confirmed in the left superior cerebellum (left SCA territory), the left corona radiata, left basal ganglia, as well as the right thalamus, right corona radiata, and  affecting the body of the corpus callosum (series 10, image 36). Moderate patchy T2 hyperintensity in the right paracentral pons also most resembles chronic small-vessel ischemia. No cortical encephalomalacia or definite chronic blood products. No midline shift, mass effect, evidence of mass lesion, ventriculomegaly, extra-axial collection or acute intracranial hemorrhage. Cervicomedullary junction and pituitary are within normal limits. Vascular: Major intracranial vascular flow voids are preserved. Skull and upper cervical spine: Negative for age visible cervical spine, bone marrow signal. Sinuses/Orbits: Negative orbits. Paranasal sinuses and mastoids are stable and well pneumatized. Other: There is a small volume of fluid trapped in the left petrous apex (series 10, image 12) with facilitated diffusion and no complicating features. Otherwise grossly normal visible internal auditory structures. Scalp and face appear negative. IMPRESSION: 1. Suspect punctate acute on chronic lacunar infarct at the dorsal right lentiform / posterior limb of the right internal capsule. No associated hemorrhage or mass effect. 2. Underlying advanced chronic small vessel disease, with chronic ischemia in the left basal ganglia and the left cerebellum SCA territory corresponding to head CT findings earlier today. Electronically Signed   By: Odessa Fleming M.D.   On: 04/05/2020 13:44   DG Knee Complete 4 Views Left  Result Date: 04/05/2020 CLINICAL DATA:  Left knee pain after fall. EXAM: LEFT KNEE - COMPLETE 4+ VIEW COMPARISON:  None. FINDINGS: No evidence of fracture, dislocation, or joint effusion. No evidence of arthropathy or other focal bone abnormality. Soft tissues are unremarkable. IMPRESSION: Negative. Electronically Signed   By: Lupita Raider M.D.   On: 04/05/2020 11:03   DG Foot Complete Left  Result Date: 04/11/2020 CLINICAL DATA:  LEFT great toe pain, no injury EXAM: LEFT  FOOT - COMPLETE 3+ VIEW COMPARISON:  None  FINDINGS: Osseous demineralization. Joint spaces preserved. No acute fracture, dislocation, or bone destruction. IMPRESSION: No acute abnormalities. Electronically Signed   By: Ulyses Southward M.D.   On: 04/11/2020 14:36   ECHOCARDIOGRAM COMPLETE  Result Date: 04/06/2020    ECHOCARDIOGRAM REPORT   Patient Name:   DSHAUN REPPUCCI Date of Exam: 04/06/2020 Medical Rec #:  [id number]      Height:       69.0 in Accession #:    [id number]     Weight:       175.0 lb Date of Birth:  [birth year]       BSA:          1.952 m Patient Age:    59 years       BP:           125/70 mmHg Patient Gender: M              HR:           68 bpm. Exam Location:  Inpatient Procedure: 2D Echo, Cardiac Doppler, Color Doppler and Strain Analysis Indications:    Stroke  History:        Patient has prior history of Echocardiogram examinations, most                 recent 02/29/2016. Stroke, Signs/Symptoms:Chest Pain; Risk                 Factors:Diabetes and Dyslipidemia. Cocaine use.  Sonographer:    Sheralyn Boatman RDCS Referring Phys: [id number] RONDELL A SMITH  Sonographer Comments: Technically difficult study due to poor echo windows. Global longitudinal strain was attempted. IMPRESSIONS  1. Left ventricular ejection fraction, by estimation, is 55 to 60%. The left ventricle has normal function. The left ventricle has no regional wall motion abnormalities. There is moderate left ventricular hypertrophy. Left ventricular diastolic parameters are consistent with Grade II diastolic dysfunction (pseudonormalization). The average left ventricular global longitudinal strain is -18.1 %.  2. Right ventricular systolic function is normal. The right ventricular size is normal.  3. The mitral valve is grossly normal. Trivial mitral valve regurgitation.  4. The aortic valve is grossly normal. Aortic valve regurgitation is not visualized. No aortic stenosis is present. FINDINGS  Left Ventricle: Left ventricular ejection fraction, by estimation, is 55 to 60%. The left  ventricle has normal function. The left ventricle has no regional wall motion abnormalities. The average left ventricular global longitudinal strain is -18.1 %. The left ventricular internal cavity size was normal in size. There is moderate left ventricular hypertrophy. Left ventricular diastolic parameters are consistent with Grade II diastolic dysfunction (pseudonormalization). Right Ventricle: The right ventricular size is normal. No increase in right ventricular wall thickness. Right ventricular systolic function is normal. Left Atrium: Left atrial size was normal in size. Right Atrium: Right atrial size was normal in size. Pericardium: There is no evidence of pericardial effusion. Mitral Valve: The mitral valve is grossly normal. Trivial mitral valve regurgitation. Tricuspid Valve: The tricuspid valve is grossly normal. Tricuspid valve regurgitation is trivial. Aortic Valve: The aortic valve is grossly normal. Aortic valve regurgitation is not visualized. No aortic stenosis is present. Pulmonic Valve: The pulmonic valve was normal in structure. Pulmonic valve regurgitation is not visualized. Aorta: The aortic root and ascending aorta are structurally normal, with no evidence of dilitation. IAS/Shunts: The atrial septum is grossly normal.  LEFT VENTRICLE PLAX 2D LVIDd:  3.60 cm     Diastology LVIDs:         2.50 cm     LV e' medial:    7.07 cm/s LV PW:         1.70 cm     LV E/e' medial:  9.5 LV IVS:        1.30 cm     LV e' lateral:   10.60 cm/s LVOT diam:     1.90 cm     LV E/e' lateral: 6.3 LV SV:         45 LV SV Index:   23          2D Longitudinal Strain LVOT Area:     2.84 cm    2D Strain GLS Avg:     -18.1 %  LV Volumes (MOD) LV vol d, MOD A2C: 47.7 ml LV vol d, MOD A4C: 71.2 ml LV vol s, MOD A2C: 23.2 ml LV vol s, MOD A4C: 29.2 ml LV SV MOD A2C:     24.5 ml LV SV MOD A4C:     71.2 ml LV SV MOD BP:      32.5 ml RIGHT VENTRICLE             IVC RV S prime:     10.90 cm/s  IVC diam: 1.80 cm TAPSE  (M-mode): 1.9 cm LEFT ATRIUM             Index      RIGHT ATRIUM           Index LA diam:        4.00 cm 2.05 cm/m RA Area:     11.70 cm LA Vol (A2C):   13.0 ml 6.66 ml/m RA Volume:   25.20 ml  12.91 ml/m LA Vol (A4C):   18.2 ml 9.32 ml/m LA Biplane Vol: 16.8 ml 8.61 ml/m  AORTIC VALVE LVOT Vmax:   89.20 cm/s LVOT Vmean:  63.900 cm/s LVOT VTI:    0.159 m  AORTA Ao Root diam: 3.30 cm Ao Asc diam:  3.20 cm MITRAL VALVE MV Area (PHT): 2.73 cm    SHUNTS MV Decel Time: 278 msec    Systemic VTI:  0.16 m MV E velocity: 67.20 cm/s  Systemic Diam: 1.90 cm MV A velocity: 50.00 cm/s MV E/A ratio:  1.34 Kristeen Miss MD Electronically signed by Kristeen Miss MD Signature Date/Time: 04/06/2020/12:47:27 PM    Final    VAS US CAROTID  Result Date: 04/06/2020 Carotid Arterial Duplex Study Indications:       CVA. Risk Factors:      Prior CVA. Comparison Study:  No prior studies. Performing Technologist: Chanda Busing RVT  Examination Guidelines: A complete evaluation includes B-mode imaging, spectral Doppler, color Doppler, and power Doppler as needed of all accessible portions of each vessel. Bilateral testing is considered an integral part of a complete examination. Limited examinations for reoccurring indications may be performed as noted.  Right Carotid Findings: +----------+--------+--------+--------+-----------------------+--------+           PSV cm/sEDV cm/sStenosisPlaque Description     Comments +----------+--------+--------+--------+-----------------------+--------+ CCA Prox  85      17              smooth and heterogenous         +----------+--------+--------+--------+-----------------------+--------+ CCA Distal42      14              smooth and heterogenous         +----------+--------+--------+--------+-----------------------+--------+  ICA Prox  38      16              smooth and heterogenous         +----------+--------+--------+--------+-----------------------+--------+ ICA  Distal58      21                                              +----------+--------+--------+--------+-----------------------+--------+ ECA       87      16                                              +----------+--------+--------+--------+-----------------------+--------+ +----------+--------+-------+--------+-------------------+           PSV cm/sEDV cmsDescribeArm Pressure (mmHG) +----------+--------+-------+--------+-------------------+ [id number]                                        +----------+--------+-------+--------+-------------------+ +---------+--------+--+--------+--+---------+ VertebralPSV cm/s32EDV cm/s13Antegrade +---------+--------+--+--------+--+---------+  Left Carotid Findings: +----------+--------+--------+--------+-----------------------+--------+           PSV cm/sEDV cm/sStenosisPlaque Description     Comments +----------+--------+--------+--------+-----------------------+--------+ CCA Prox  75      15              smooth and heterogenous         +----------+--------+--------+--------+-----------------------+--------+ CCA Distal31      8               smooth and heterogenous         +----------+--------+--------+--------+-----------------------+--------+ ICA Prox  37      14              smooth and heterogenous         +----------+--------+--------+--------+-----------------------+--------+ ICA Distal84      36                                              +----------+--------+--------+--------+-----------------------+--------+ ECA       77      18                                              +----------+--------+--------+--------+-----------------------+--------+ +----------+--------+--------+--------+-------------------+           PSV cm/sEDV cm/sDescribeArm Pressure (mmHG) +----------+--------+--------+--------+-------------------+ [id number]                                           +----------+--------+--------+--------+-------------------+ +---------+--------+--+--------+--+---------+ VertebralPSV cm/s46EDV cm/s14Antegrade +---------+--------+--+--------+--+---------+   Summary: Right Carotid: Velocities in the right ICA are consistent with a 1-39% stenosis. Left Carotid: Velocities in the left ICA are consistent with a 1-39% stenosis. Vertebrals: Bilateral vertebral arteries demonstrate antegrade flow. *See table(s) above for measurements and observations.  Electronically signed by Sherald Hess MD on 04/06/2020 at 4:37:52 PM.    Final     Microbiology: Recent Results (from the past 240 hour(s))  SARS Coronavirus 2 by RT PCR (  hospital order, performed in [facility] hospital lab) Nasopharyngeal Nasopharyngeal Swab     Status: None   Collection Time: 04/06/20  5:54 PM   Specimen: Nasopharyngeal Swab  Result Value Ref Range Status   SARS Coronavirus 2 NEGATIVE NEGATIVE Final    Comment: (NOTE) SARS-CoV-2 target nucleic acids are NOT DETECTED.  The SARS-CoV-2 RNA is generally detectable in upper and lower respiratory specimens during the acute phase of infection. The lowest concentration of SARS-CoV-2 viral copies this assay can detect is 250 copies / mL. A negative result does not preclude SARS-CoV-2 infection and should not be used as the sole basis for treatment or other patient management decisions.  A negative result may occur with improper specimen collection / handling, submission of specimen other than nasopharyngeal swab, presence of viral mutation(s) within the areas targeted by this assay, and inadequate number of viral copies (<250 copies / mL). A negative result must be combined with clinical observations, patient history, and epidemiological information.  Fact Sheet for Patients:   BoilerBrush.com.cy  Fact Sheet for Healthcare Providers: https://pope.com/  This test is not yet approved or   cleared by the [place] FDA and has been authorized for detection and/or diagnosis of SARS-CoV-2 by FDA under an Emergency Use Authorization (EUA).  This EUA will remain in effect (meaning this test can be used) for the duration of the COVID-19 declaration under Section 564(b)(1) of the Act, 21 U.S.C. section 360bbb-3(b)(1), unless the authorization is terminated or revoked sooner.  Performed at [facility], [address] [place], [place] [address]      Labs: Basic Metabolic Panel: Recent Labs  Lab 04/12/20 0851 04/14/20 0951  K 4.8  --   MG 1.5* 1.9   Liver Function Tests: No results for input(s): AST, ALT, ALKPHOS, BILITOT, PROT, ALBUMIN in the last 168 hours. No results for input(s): LIPASE, AMYLASE in the last 168 hours. No results for input(s): AMMONIA in the last 168 hours. CBC: No results for input(s): WBC, NEUTROABS, HGB, HCT, MCV, PLT in the last 168 hours. Cardiac Enzymes: No results for input(s): CKTOTAL, CKMB, CKMBINDEX, TROPONINI in the last 168 hours. BNP: BNP (last 3 results) No results for input(s): BNP in the last 8760 hours.  ProBNP (last 3 results) No results for input(s): PROBNP in the last 8760 hours.  CBG: Recent Labs  Lab 04/15/20 1247 04/15/20 1616 04/15/20 2105 04/16/20 0720 04/16/20 1207  GLUCAP 193* 215* 317* 126* 135*       Signed:  Darlin Drop, MD [facility] 04/16/2020, 12:37 PM

## 2020-04-16 NOTE — TOC Progression Note (Signed)
Transition of Care [facility]) - Progression Note    Patient Details  Name: [name] MRN: [id number] Date of Birth: 04/21/1961  [facility]) CM/SW Contact  Geni Bers, RN Phone Number: 04/16/2020, 2:39 PM  Clinical Narrative:     Pt is now medically cleared. Pt was faxed to SNF with no bed offers. Pt may benefit with HHPT/RN/SW.   Expected Discharge Plan: Skilled Nursing Facility Barriers to Discharge: Continued Medical Work up  Expected Discharge Plan and Services Expected Discharge Plan: Skilled Nursing Facility In-house Referral: Clinical Social Work Discharge Planning Services: CM Consult Post Acute Care Choice: Skilled Nursing Facility Living arrangements for the past 2 months: Single Family Home Expected Discharge Date: 04/16/20                                     Social Determinants of Health (SDOH) Interventions    Readmission Risk Interventions No flowsheet data found.

## 2020-04-16 NOTE — TOC Progression Note (Signed)
Transition of Care [facility]) - Progression Note    Patient Details  Name: [name] MRN: [id number] Date of Birth: 12-09-1960  [facility]) CM/SW Contact  Geni Bers, RN Phone Number: 04/16/2020, 4:08 PM  Clinical Narrative:    A call to Pt's daughter was made with no answer.  VM left, waiting for return call. There are no bed offers at present time.    Expected Discharge Plan: Skilled Nursing Facility Barriers to Discharge: Continued Medical Work up  Expected Discharge Plan and Services Expected Discharge Plan: Skilled Nursing Facility In-house Referral: Clinical Social Work Discharge Planning Services: CM Consult Post Acute Care Choice: Skilled Nursing Facility Living arrangements for the past 2 months: Single Family Home Expected Discharge Date: 04/16/20                                     Social Determinants of Health (SDOH) Interventions    Readmission Risk Interventions No flowsheet data found.

## 2020-04-17 LAB — GLUCOSE, CAPILLARY
Glucose-Capillary: 145 mg/dL — ABNORMAL HIGH (ref 70–99)
Glucose-Capillary: 113 mg/dL — ABNORMAL HIGH (ref 70–99)
Glucose-Capillary: 247 mg/dL — ABNORMAL HIGH (ref 70–99)
Glucose-Capillary: 260 mg/dL — ABNORMAL HIGH (ref 70–99)

## 2020-04-17 NOTE — TOC Progression Note (Signed)
Transition of Care [facility]) - Progression Note    Patient Details  Name: [name] MRN: [id number] Date of Birth: [birth year]  Transition [facility] Care [facility]) CM/SW Contact  Geni Bers, RN Phone Number: 04/17/2020, 11:06 AM  Clinical Narrative:    Pt Yelling asking for SW. Pt asking RN for SW, where am I going. A visit is made to pt everyday explaining that there are no bed offers for him at this present time. [facility] MD asked for all MD notes to be faxed to [phone]. Faxed.    Expected Discharge Plan: Skilled Nursing Facility Barriers to Discharge: Continued Medical Work up  Expected Discharge Plan and Services Expected Discharge Plan: Skilled Nursing Facility In-house Referral: Clinical Social Work Discharge Planning Services: CM Consult Post Acute Care Choice: Skilled Nursing Facility Living arrangements for the past 2 months: Single Family Home Expected Discharge Date: 04/17/20                                     Social Determinants of Health (SDOH) Interventions    Readmission Risk Interventions No flowsheet data found.

## 2020-04-17 NOTE — Discharge Summary (Signed)
Discharge Summary  [name] ZOX:[id number] DOB: February 23, 1961  PCP: System, Provider Not In  Admit date: 04/05/2020 Discharge date: 04/17/2020  Time spent: 35 minutes  Recommendations for Outpatient Follow-up:  1. Follow up with neurology 2. Follow up with Psychiatry 3. Follow-up with your PCP 4. Take your medications as prescribed. 5. Continue PT OT with assistance and fall precautions.  Discharge Diagnoses:  Active Hospital Problems   Diagnosis Date Noted  . Schizoaffective disorder, bipolar type (HCC) 02/05/2016  . CVA (cerebral vascular accident) (HCC) 04/05/2020  . Frequent falls 04/05/2020  . 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 Suicidal ideation 02/06/2016  . Hyperlipidemia 01/18/2015  . Diabetes mellitus (HCC) 03/11/2012    Resolved Hospital Problems  No resolved problems to display.    Discharge Condition: Stable  Diet recommendation: Heart healthy carb modified diet.  Vitals:   04/16/20 2052 04/17/20 0505  BP: (!) 161/91 124/80  Pulse: 77 81  Resp: 18 14  Temp: 98.3 F (36.8 C) 97.9 F (36.6 C)  SpO2: 98% 100%    History of present illness:  59 year old male with history of diabetes mellitus type 2, hepatitis C, bipolar disorder presented with suicidal ideations, suicidal attempt with a gun that failed to fire when pointed to his head, and frequent falls. He reportedly attempted to place a gun to his head and pulled the trigger, but it was jammed. Patient did admit to recently using cocaine and alcohol 6 days prior to presentation. In the ED, MRI of the brain done on 04/05/2020 showed acute on chronic lacunar infarct at the dorsal right lentiform/posterior limb of the right internal capsule. Neurology/psychiatry were consulted.Neurology recommended aspirin and Plavix for 3 months along with high intensity Lipitor and outpatient follow-up with neurology. Seen by Psychiatry, his psych medications were adjusted.  One-to-one  sitter was in place for patient's own safety.  Per psychiatry, patient is not a candidate for inpatient psych admission as they suspected malingering.  Psychiatry was re-consulted due to the severity of this act of suicidal attempt with a gun that failed to fire when pointed at his head.  TOC was consulted with assisting with placement to a facility for close monitoring and management of ongoing medical condition.  04/11/2020 reported left first great toe pain, x-ray complete of left foot was negative for acute abnormalities.  Burning pain on his left lower extremity likely neuropathic pain, improved with gabapentin.  Patient continues to express how depressed he feels.  Denies suicidal or homicidal ideation.  In addition, he has a poor social support.    He is stable to be discharged from a medical standpoint.   04/17/2020: Seen and examined at his bedside.  Wrapped in covers.  States he is depressed and wants to go to a facility that will provide him the help he needs.    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 -Continue high dose statin Lipitor 80 mg daily -Tolerating diet as per  SLP recommendations. -LDL 171, goal LDL less than 70. Hemoglobin A1c 14.2, goal A1c less than 7.0. -Carotid ultrasound showed bilateral 1 to 39% ICA stenosis. 2D echo showed EF of 55 to 60% with grade 2 diastolic  dysfunction -Neurology has signed off.  -Outpatient follow-up with neurology  Suicidal attempt with a gun/schizoaffective /bipolar disorder Recent suicidal attempt with a gun He reportedly attempted to place a gun to his head and pulled the trigger, but it jammed. Continue to monitor with sitter for patient's own safety May not be a safe discharge to the community at this time due to the severity of the recent suicidal attempt. Plan to dc to inpatient behavioral health Continue Abilify 7.5 mg daily and trazodone 100 mg qhs as recommended by psych. Psych re-consulted.  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 Outpatient follow-up  Resolved post repletion: Hypomagnesemia Repleted intravenously Mg2+ 1.9 on 04/14/20  Resolved left great toe pain Reports left great toe pain.  Denies prior hx of gout.  X-ray negative for acute abnormalities. Analgesic as needed.  Polyneuropathy Stable Continue gabapentin  Twomm vascular protrusion arising fromRICA/CCAbifurcation-infundibulum versus small aneurysm. Outpatient follow-up with repeat CTA in a year as per neurology Follow-up with neurology outpatient.  Diabetes mellitus type 2 uncontrolled with hyperglycemia -A1c 14.2. Goal A1C <7.0 Continue Levemir along with Humalog.  Carb modified diet.  Resume Metformin at discharge.  Outpatient follow-up with PCP  Recurrent falls/ambulatory dysfunction Continue PT OT with assistance and fall precautions  Hyperlipidemia LDL 171, goal LDL less than 70. Continue High intensity statin, Lipitor 80 mg daily. Follow-up with your PCP and neurology  Polysubstance abuse -Longstanding history of cocaine/alcohol abuse.  Urine drug screen was positive for cocaine on 04/05/20.  Polysubstance cessation counseling done at bedside.     Code Status:  Full     Consultants: Neurology Psychiatry  Procedures:  None  Antimicrobials:  None  DVT prophylaxis: SQ Lovenox daily   Disposition Plan:   Status is: Inpatient    Dispo: The patient is from: Home  Anticipated d/c is to: Psych ward   Anticipated d/c date is: 04/17/20  Patient currently is medically stable for discharge.   Discharge Exam: No significant changes from prior exam. BP 124/80 (BP Location: Right Arm)   Pulse 81   Temp 97.9 F (36.6 C) (Oral)   Resp 14   Ht 5\' 9"  (1.753 m)   Wt 75.3 kg   SpO2 100%   BMI 24.51 kg/m  . General: 59 y.o. year-old male well-developed well-nourished no acute distress.  Alert and oriented x3.  Flat affect. . Cardiovascular: Regular rate and rhythm no rubs or gallops. Marland Kitchen Respiratory: Clear to auscultation with no wheezes or rales..   . Abdomen: Soft nontender normal bowel sounds present.   . Musculoskeletal: No lower extremity edema bilaterally.   Marland Kitchen Psychiatry: Mood is appropriate for condition and setting.  Discharge Instructions You were cared for by a hospitalist during your hospital stay. If you have any questions about your discharge medications or the care you received while you were in the hospital after you are discharged, you can call the unit and asked to speak with the hospitalist on call if the hospitalist that took care of you is not available. Once you are discharged, your primary care physician will handle any further medical issues. Please note that NO REFILLS for any discharge medications will be authorized once you  are discharged, as it is imperative that you return to your primary care physician (or establish a relationship with a primary care physician if you do not have one) for your aftercare needs so that they can reassess your need for medications and monitor your lab values.  Discharge Instructions    Ambulatory referral to Neurology   Complete by: As  directed    An appointment is requested in approximately: 2-3 weeks for follow-up of stroke   Diet - low sodium heart healthy   Complete by: As directed    Diet Carb Modified   Complete by: As directed    Increase activity slowly   Complete by: As directed      Allergies as of 04/17/2020      Reactions   Wellbutrin [bupropion] Hives, Swelling      Medication List    STOP taking these medications   FLUoxetine 40 MG capsule Commonly known as: PROZAC   glipiZIDE 10 MG tablet Commonly known as: GLUCOTROL   hydrOXYzine 25 MG tablet Commonly known as: ATARAX/VISTARIL   QUEtiapine 200 MG tablet Commonly known as: SEROQUEL   zolpidem 5 MG tablet Commonly known as: AMBIEN     TAKE these medications   ARIPiprazole 15 MG tablet Commonly known as: ABILIFY Take 0.5 tablets (7.5 mg total) by mouth daily. What changed: how much to take   aspirin 325 MG tablet Take 1 tablet (325 mg total) by mouth daily.   atorvastatin 80 MG tablet Commonly known as: LIPITOR Take 1 tablet (80 mg total) by mouth daily.   clopidogrel 75 MG tablet Commonly known as: PLAVIX Take 1 tablet (75 mg total) by mouth daily.   folic acid 1 MG tablet Commonly known as: FOLVITE Take 1 tablet (1 mg total) by mouth daily.   gabapentin 100 MG capsule Commonly known as: NEURONTIN Take 2 capsules (200 mg total) by mouth 2 (two) times daily.   HumaLOG KwikPen 100 UNIT/ML KwikPen Generic drug: insulin lispro Inject 0-0.1 mLs (0-10 Units total) into the skin 3 (three) times daily with meals.   insulin detemir 100 UNIT/ML injection Commonly known as: LEVEMIR Inject 0.45 mLs (45 Units total) into the skin at bedtime. What changed:   how much to take  additional instructions   metFORMIN 1000 MG tablet Commonly known as: GLUCOPHAGE Take 1 tablet (1,000 mg total) by mouth 2 (two) times daily with a meal.   multivitamin with minerals Tabs tablet Take 1 tablet by mouth daily.   thiamine 100 MG  tablet Take 1 tablet (100 mg total) by mouth daily.   traZODone 100 MG tablet Commonly known as: DESYREL Take 1 tablet (100 mg total) by mouth at bedtime.            Durable Medical Equipment  (From admission, onward)         Start     Ordered   04/10/20 1439  For home use only DME 4 wheeled rolling walker with seat  Once       Question:  Patient needs a walker to treat with the following condition  Answer:  Fear for personal safety   04/10/20 1439         Allergies  Allergen Reactions  . Wellbutrin [Bupropion] Hives and Swelling    Follow-up Information    Milon Dikes, MD. Call in 1 day(s).   Specialty: Neurology Why: Please call for a post hospital follow-up appointment. Contact information: [address] [place] [address] [phone]  [facility]. Call in 1 day(s).   Why: Please call for a post hospital follow-up appointment. Contact information: [address] [place] [address] [phone]       [facility]. Call in 1 day(s).   Why: please call for a post hospital follow up appointment. Contact information: [address]  [address] [place] [address] [phone]                The results of significant diagnostics from this hospitalization (including imaging, microbiology, ancillary and laboratory) are listed below for reference.    Significant Diagnostic Studies: CT ANGIO HEAD W OR WO CONTRAST  Result Date: 04/06/2020 CLINICAL DATA:  Stroke/TIA, assess extracranial arteries, right internal capsule stroke. EXAM: CT ANGIOGRAPHY HEAD AND NECK TECHNIQUE: Multidetector CT imaging of the head and neck was performed using the standard protocol during bolus administration of intravenous contrast. Multiplanar CT image reconstructions and MIPs were obtained to evaluate the vascular anatomy. Carotid stenosis measurements (when applicable) are obtained utilizing NASCET  criteria, using the distal internal carotid diameter as the denominator. CONTRAST:  OMNIPAQUE IOHEXOL 350 MG/ML SOLN COMPARISON:  Brain MRI performed earlier the same day 04/05/2020, prior head CT examinations 04/05/2020 and earlier. FINDINGS: CT HEAD FINDINGS Brain: A punctate acute infarct within the right basal ganglia/posterior limb of right internal capsule was better appreciated on the brain MRI performed earlier the same day. Advanced ill-defined hypoattenuation within the cerebral white matter consistent with chronic small vessel ischemic disease. Redemonstrated chronic lacunar infarcts within the bilateral corona radiata/basal ganglia and right thalamus. Also redemonstrated is a small chronic infarct within the left cerebellar hemisphere. There is no acute intracranial hemorrhage. No demarcated cortical infarct is identified. No extra-axial fluid collection. No evidence of intracranial mass. No midline shift. Vascular: Reported below. Skull: Normal. Negative for fracture or focal lesion. Sinuses: Minimal ethmoid and maxillary sinus mucosal thickening. No significant mastoid effusion. Orbits: No acute finding. Review of the MIP images confirms the above findings CTA NECK FINDINGS Aortic arch: Common origin of the innominate and left common carotid arteries. Atherosclerotic plaque within the visualized aortic arch. No hemodynamically significant innominate or proximal subclavian artery stenosis. Right carotid system: CCA and ICA patent within the neck without significant stenosis (50% or greater). Minimal mixed plaque within the prox ICA. Left carotid system: CCA and ICA patent within the neck without significant stenosis (50% or greater). Mild mixed plaque within the carotid bifurcation and proximal ICA. This includes a small focus of ulcerated plaque along the posterior aspect of the carotid bulb (series 14, image 131) Vertebral arteries: Patent bilaterally without significant stenosis. The left  vertebral artery is slightly dominant. Skeleton: No acute bony abnormality or aggressive osseous lesion. Cervical spondylosis with multilevel disc space narrowing, posterior disc osteophytes, uncovertebral and facet hypertrophy. Additionally, there are multilevel prominent ventral osteophytes most notably at the C5-C6 and C6-C7 levels. Other neck: No neck mass or cervical lymphadenopathy. Upper chest: No consolidation within the imaged lung apices. Review of the MIP images confirms the above findings CTA HEAD FINDINGS Anterior circulation: The intracranial internal carotid arteries are patent. Minimal calcified plaque within the intracranial left ICA without stenosis. The M1 middle cerebral arteries are patent without significant stenosis. Mild-to-moderate segmental narrowing of the proximal to mid M1 left MCA (series 15, image 50). No M2 proximal branch occlusion is identified. There are high-grade focal stenoses within multiple distal M2 left MCA branch vessels (for instance as seen on series 17, image 28).  The anterior cerebral arteries are patent. Moderate/severe segmental stenosis within the A2 right anterior cerebral artery (series 17, image 22). 2 mm inferiorly projecting vascular protrusion arising from the paraclinoid right ICA, which may reflect an infundibulum or small aneurysm (series 14, image 87). Posterior circulation: The intracranial vertebral arteries are patent. The basilar artery is patent. Fetal origin left posterior cerebral artery. The posterior cerebral arteries are patent bilaterally. There is a moderate focal stenosis within the P1 right PCA. Severe focal stenosis within the proximal P2 right PCA. Moderate/severe focal stenoses within the left posterior communicating artery and P2 left PCA (series 15, image 50). The right posterior communicating artery is hypoplastic or absent. Venous sinuses: Within limitations of contrast timing, no convincing thrombus. Anatomic variants: As described  Review of the MIP images confirms the above findings IMPRESSION: CT head: 1. A punctate acute infarct within the right basal ganglia/posterior limb of right internal capsule was better appreciated on the brain MRI performed earlier the same day. 2. 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 3. Redemonstrated small chronic infarct within the left cerebellum. CTA neck: The common carotid, internal carotid and vertebral arteries are patent within the neck without hemodynamically significant stenosis. Mild calcified plaque within the carotid bifurcations/proximal ICAs bilaterally. This includes a focus of ulcerated plaque within the posterior aspect of the left carotid bulb. CTA head: 1. No intracranial large vessel occlusion. 2. Intracranial atherosclerotic disease with multifocal stenoses, most notably as follows. 3. Mild-to-moderate segmental narrowing of the proximal to mid M1 left MCA. 4. High-grade focal stenoses within multiple distal M2 left MCA branch vessels. 5. Moderate/severe segmental stenosis within the A2 right anterior cerebral artery. 6. Moderate/severe focal stenosis within the P1 right PCA 7. Severe focal stenosis within the P2 right PCA. 8. The left PCA is fetal in origin. Moderate/severe focal stenoses within the left posterior communicating artery and P2 left PCA. 9. 2 mm vascular protrusion arising from the paraclinoid right ICA which may reflect an infundibulum or small aneurysm. Electronically Signed   By: Jackey Loge DO   On: 04/06/2020 14:34   DG Chest 2 View  Result Date: 04/04/2020 CLINICAL DATA:  Hyperglycemic, suicidal thoughts X 3 days. History of hypertension, diabetes, schizoaffective and bipolar disorder, hepatitis. EXAM: CHEST - 2 VIEW COMPARISON:  Chest x-ray 02/28/2016. chest x-ray 02/03/2013 FINDINGS: The heart size and mediastinal  contours are within normal limits. Both lungs are clear. No acute osseous finding. Multilevel degenerative changes of the spine with chronic mild anterior wedge shaped compression deformity of a midthoracic body IMPRESSION: No active cardiopulmonary disease. Electronically Signed   By: Tish Frederickson M.D.   On: 04/04/2020 10:48   CT Head Wo Contrast  Result Date: 04/05/2020 CLINICAL DATA:  Trauma EXAM: CT HEAD WITHOUT CONTRAST CT CERVICAL SPINE WITHOUT CONTRAST TECHNIQUE: Multidetector CT imaging of the head and cervical spine was performed following the standard protocol without intravenous contrast. Multiplanar CT image reconstructions of the cervical spine were also generated. COMPARISON:  01/18/2015 head CT. FINDINGS: CT HEAD FINDINGS Brain: Age indeterminate focal hypodensities involving the left basal ganglia (3:14) and superior left cerebellum (7:46, new since prior exam. Additional scattered and confluent hypodense foci involving the periventricular and subcortical white matter are nonspecific however may reflect chronic microvascular ischemic changes. No mass lesion. No midline shift, ventriculomegaly or extra-axial fluid collection. Mild cerebral atrophy with ex vacuo dilatation. Vascular: No hyperdense vessel or  unexpected calcification. Skull: Negative for fracture or focal lesion. Sinuses/Orbits: Normal orbits. Clear paranasal sinuses. No mastoid effusion. Other: None. CT CERVICAL SPINE FINDINGS Alignment: Straightening of lordosis.  No listhesis. Skull base and vertebrae: No acute fracture. No primary bone lesion or focal pathologic process. Soft tissues and spinal canal: No prevertebral fluid or swelling. No visible canal hematoma. Disc levels: Multilevel spondylosis most prominent at the C5-7 levels. Patent bony spinal canal. Multilevel mild-to-moderate bony neural foraminal narrowing secondary to uncovertebral and facet hypertrophy. Upper chest: Clear lung apices. Other: None. IMPRESSION: Focal  hypodensities involving the left basal ganglia and superior left cerebellum are age indeterminate. Consider MRI brain to exclude subacute insults. Mild cerebral atrophy and chronic microvascular ischemic changes. No acute fracture or traumatic listhesis.  Multilevel spondylosis. These results were called by telephone at the time of interpretation on 04/05/2020 at 10:51 am to provider [facility] , who verbally acknowledged these results. Electronically Signed   By: Stana Bunting M.D.   On: 04/05/2020 11:02   CT ANGIO NECK W OR WO CONTRAST  Result Date: 04/06/2020 CLINICAL DATA:  Stroke/TIA, assess extracranial arteries, right internal capsule stroke. EXAM: CT ANGIOGRAPHY HEAD AND NECK TECHNIQUE: Multidetector CT imaging of the head and neck was performed using the standard protocol during bolus administration of intravenous contrast. Multiplanar CT image reconstructions and MIPs were obtained to evaluate the vascular anatomy. Carotid stenosis measurements (when applicable) are obtained utilizing NASCET criteria, using the distal internal carotid diameter as the denominator. CONTRAST:  OMNIPAQUE IOHEXOL 350 MG/ML SOLN COMPARISON:  Brain MRI performed earlier the same day 04/05/2020, prior head CT examinations 04/05/2020 and earlier. FINDINGS: CT HEAD FINDINGS Brain: A punctate acute infarct within the right basal ganglia/posterior limb of right internal capsule was better appreciated on the brain MRI performed earlier the same day. Advanced ill-defined hypoattenuation within the cerebral white matter consistent with chronic small vessel ischemic disease. Redemonstrated chronic lacunar infarcts within the bilateral corona radiata/basal ganglia and right thalamus. Also redemonstrated is a small chronic infarct within the left cerebellar hemisphere. There is no acute intracranial hemorrhage. No demarcated cortical infarct is identified. No extra-axial fluid collection. No evidence of intracranial mass.  No midline shift. Vascular: Reported below. Skull: Normal. Negative for fracture or focal lesion. Sinuses: Minimal ethmoid and maxillary sinus mucosal thickening. No significant mastoid effusion. Orbits: No acute finding. Review of the MIP images confirms the above findings CTA NECK FINDINGS Aortic arch: Common origin of the innominate and left common carotid arteries. Atherosclerotic plaque within the visualized aortic arch. No hemodynamically significant innominate or proximal subclavian artery stenosis. Right carotid system: CCA and ICA patent within the neck without significant stenosis (50% or greater). Minimal mixed plaque within the prox ICA. Left carotid system: CCA and ICA patent within the neck without significant stenosis (50% or greater). Mild mixed plaque within the carotid bifurcation and proximal ICA. This includes a small focus of ulcerated plaque along the posterior aspect of the carotid bulb (series 14, image 131) Vertebral arteries: Patent bilaterally without significant stenosis. The left vertebral artery is slightly dominant. Skeleton: No acute bony abnormality or aggressive osseous lesion. Cervical spondylosis with multilevel disc space narrowing, posterior disc osteophytes, uncovertebral and facet hypertrophy. Additionally, there are multilevel prominent ventral osteophytes most notably at the C5-C6 and C6-C7 levels. Other neck: No neck mass or cervical lymphadenopathy. Upper chest: No consolidation within the imaged lung apices. Review of the MIP images confirms the above findings CTA HEAD FINDINGS Anterior circulation: The intracranial internal carotid  arteries are patent. Minimal calcified plaque within the intracranial left ICA without stenosis. The M1 middle cerebral arteries are patent without significant stenosis. Mild-to-moderate segmental narrowing of the proximal to mid M1 left MCA (series 15, image 50). No M2 proximal branch occlusion is identified. There are high-grade focal  stenoses within multiple distal M2 left MCA branch vessels (for instance as seen on series 17, image 28). The anterior cerebral arteries are patent. Moderate/severe segmental stenosis within the A2 right anterior cerebral artery (series 17, image 22). 2 mm inferiorly projecting vascular protrusion arising from the paraclinoid right ICA, which may reflect an infundibulum or small aneurysm (series 14, image 87). Posterior circulation: The intracranial vertebral arteries are patent. The basilar artery is patent. Fetal origin left posterior cerebral artery. The posterior cerebral arteries are patent bilaterally. There is a moderate focal stenosis within the P1 right PCA. Severe focal stenosis within the proximal P2 right PCA. Moderate/severe focal stenoses within the left posterior communicating artery and P2 left PCA (series 15, image 50). The right posterior communicating artery is hypoplastic or absent. Venous sinuses: Within limitations of contrast timing, no convincing thrombus. Anatomic variants: As described Review of the MIP images confirms the above findings IMPRESSION: CT head: 1. A punctate acute infarct within the right basal ganglia/posterior limb of right internal capsule was better appreciated on the brain MRI performed earlier the same day. 2. 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 3. Redemonstrated small chronic infarct within the left cerebellum. CTA neck: The common carotid, internal carotid and vertebral arteries are patent within the neck without hemodynamically significant stenosis. Mild calcified plaque within the carotid bifurcations/proximal ICAs bilaterally. This includes a focus of ulcerated plaque within the posterior aspect of the left carotid bulb. CTA head: 1. No intracranial large vessel occlusion. 2. Intracranial atherosclerotic disease with  multifocal stenoses, most notably as follows. 3. Mild-to-moderate segmental narrowing of the proximal to mid M1 left MCA. 4. High-grade focal stenoses within multiple distal M2 left MCA branch vessels. 5. Moderate/severe segmental stenosis within the A2 right anterior cerebral artery. 6. Moderate/severe focal stenosis within the P1 right PCA 7. Severe focal stenosis within the P2 right PCA. 8. The left PCA is fetal in origin. Moderate/severe focal stenoses within the left posterior communicating artery and P2 left PCA. 9. 2 mm vascular protrusion arising from the paraclinoid right ICA which may reflect an infundibulum or small aneurysm. Electronically Signed   By: Jackey Loge DO   On: 04/06/2020 14:34   CT Cervical Spine Wo Contrast  Result Date: 04/05/2020 CLINICAL DATA:  Trauma EXAM: CT HEAD WITHOUT CONTRAST CT CERVICAL SPINE WITHOUT CONTRAST TECHNIQUE: Multidetector CT imaging of the head and cervical spine was performed following the standard protocol without intravenous contrast. Multiplanar CT image reconstructions of the cervical spine were also generated. COMPARISON:  01/18/2015 head CT. FINDINGS: CT HEAD FINDINGS Brain: Age indeterminate focal hypodensities involving the left basal ganglia (3:14) and superior left cerebellum (7:46, new since prior exam. Additional scattered and confluent hypodense foci involving the periventricular and subcortical white matter are nonspecific however may reflect chronic microvascular ischemic changes. No mass lesion. No midline shift, ventriculomegaly or extra-axial fluid collection. Mild cerebral atrophy with ex vacuo dilatation. Vascular: No hyperdense vessel or unexpected calcification. Skull: Negative for fracture or focal lesion. Sinuses/Orbits: Normal orbits. Clear paranasal sinuses. No mastoid effusion. Other: None. CT CERVICAL SPINE FINDINGS Alignment: Straightening of lordosis.  No listhesis. Skull base and vertebrae: No acute fracture. No primary bone lesion  or focal pathologic process. Soft tissues and spinal canal: No prevertebral fluid or swelling. No visible canal hematoma. Disc levels: Multilevel spondylosis most prominent at the C5-7 levels. Patent bony spinal canal. Multilevel mild-to-moderate bony neural foraminal narrowing secondary to uncovertebral and facet hypertrophy. Upper chest: Clear lung apices. Other: None. IMPRESSION: Focal hypodensities involving the left basal ganglia and superior left cerebellum are age indeterminate. Consider MRI brain to exclude subacute insults. Mild cerebral atrophy and chronic microvascular ischemic changes. No acute fracture or traumatic listhesis.  Multilevel spondylosis. These results were called by telephone at the time of interpretation on 04/05/2020 at 10:51 am to provider [facility] , who verbally acknowledged these results. Electronically Signed   By: Stana Buntinghikanele  Emekauwa M.D.   On: 04/05/2020 11:02   MR Brain Wo Contrast (neuro protocol)  Result Date: 04/05/2020 CLINICAL DATA:  59 year old male status post trauma, neurologic deficit with left side weakness. Age indeterminate ischemia in the left basal ganglia and left cerebellum on plain head CT earlier today. EXAM: MRI HEAD WITHOUT CONTRAST TECHNIQUE: Multiplanar, multiecho pulse sequences of the brain and surrounding structures were obtained without intravenous contrast. COMPARISON:  Head CT 1029 hours today. FINDINGS: Brain: Questionable punctate focus of restricted diffusion at the dorsal right lentiform near the posterior limb of the right internal capsule, occurring in an area of chronic small vessel ischemia and encephalomalacia (series 5, image 80 and series 8, image 15). No other restricted diffusion. 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  Moderate patchy T2 hyperintensity in the right paracentral pons also most resembles chronic small-vessel ischemia. No cortical encephalomalacia or definite chronic blood products. No midline shift, mass effect, evidence of mass lesion, ventriculomegaly, extra-axial collection or acute intracranial hemorrhage. Cervicomedullary junction and pituitary are within normal limits. Vascular: Major intracranial vascular flow voids are preserved. Skull and upper cervical spine: Negative for age visible cervical spine, bone marrow signal. Sinuses/Orbits: Negative orbits. Paranasal sinuses and mastoids are stable and well pneumatized. Other: There is a small volume of fluid trapped in the left petrous apex (series 10, image 12) with facilitated diffusion and no complicating features. Otherwise grossly normal visible internal auditory structures. Scalp and face appear negative. IMPRESSION: 1. Suspect punctate acute on chronic lacunar infarct at the dorsal right lentiform / posterior limb of the right internal capsule. No associated hemorrhage or mass effect. 2. Underlying advanced chronic small vessel disease, with chronic ischemia in the left basal ganglia and the left cerebellum SCA territory corresponding to head CT findings earlier today. Electronically Signed   By: Odessa FlemingH  Ouida Abeyta M.D.   On: 04/05/2020 13:44   DG Knee Complete 4 Views Left  Result Date: 04/05/2020 CLINICAL DATA:  Left knee pain after fall. EXAM: LEFT KNEE - COMPLETE 4+ VIEW COMPARISON:  None. FINDINGS: No evidence of fracture, dislocation, or joint effusion. No evidence of arthropathy or other focal bone abnormality. Soft tissues are unremarkable. IMPRESSION: Negative. Electronically Signed   By: Lupita RaiderJames  Green Jr M.D.   On: 04/05/2020 11:03   DG Foot Complete Left  Result Date: 04/11/2020 CLINICAL DATA:  LEFT great toe pain, no injury EXAM: LEFT FOOT - COMPLETE 3+ VIEW COMPARISON:  None FINDINGS: Osseous demineralization. Joint spaces preserved. No acute  fracture, dislocation,  or bone destruction. IMPRESSION: No acute abnormalities. Electronically Signed   By: Ulyses Southward M.D.   On: 04/11/2020 14:36   ECHOCARDIOGRAM COMPLETE  Result Date: 04/06/2020    ECHOCARDIOGRAM REPORT   Patient Name:   ZERRICK HANSSEN Date of Exam: 04/06/2020 Medical Rec #:  [id number]      Height:       69.0 in Accession #:    [id number]     Weight:       175.0 lb Date of Birth:  07-14-1961       BSA:          1.952 m Patient Age:    59 years       BP:           125/70 mmHg Patient Gender: M              HR:           68 bpm. Exam Location:  Inpatient Procedure: 2D Echo, Cardiac Doppler, Color Doppler and Strain Analysis Indications:    Stroke  History:        Patient has prior history of Echocardiogram examinations, most                 recent 02/29/2016. Stroke, Signs/Symptoms:Chest Pain; Risk                 Factors:Diabetes and Dyslipidemia. Cocaine use.  Sonographer:    Sheralyn Boatman RDCS Referring Phys: [id number] RONDELL A SMITH  Sonographer Comments: Technically difficult study due to poor echo windows. Global longitudinal strain was attempted. IMPRESSIONS  1. Left ventricular ejection fraction, by estimation, is 55 to 60%. The left ventricle has normal function. The left ventricle has no regional wall motion abnormalities. There is moderate left ventricular hypertrophy. Left ventricular diastolic parameters are consistent with Grade II diastolic dysfunction (pseudonormalization). The average left ventricular global longitudinal strain is -18.1 %.  2. Right ventricular systolic function is normal. The right ventricular size is normal.  3. The mitral valve is grossly normal. Trivial mitral valve regurgitation.  4. The aortic valve is grossly normal. Aortic valve regurgitation is not visualized. No aortic stenosis is present. FINDINGS  Left Ventricle: Left ventricular ejection fraction, by estimation, is 55 to 60%. The left ventricle has normal function. The left ventricle has no regional wall  motion abnormalities. The average left ventricular global longitudinal strain is -18.1 %. The left ventricular internal cavity size was normal in size. There is moderate left ventricular hypertrophy. Left ventricular diastolic parameters are consistent with Grade II diastolic dysfunction (pseudonormalization). Right Ventricle: The right ventricular size is normal. No increase in right ventricular wall thickness. Right ventricular systolic function is normal. Left Atrium: Left atrial size was normal in size. Right Atrium: Right atrial size was normal in size. Pericardium: There is no evidence of pericardial effusion. Mitral Valve: The mitral valve is grossly normal. Trivial mitral valve regurgitation. Tricuspid Valve: The tricuspid valve is grossly normal. Tricuspid valve regurgitation is trivial. Aortic Valve: The aortic valve is grossly normal. Aortic valve regurgitation is not visualized. No aortic stenosis is present. Pulmonic Valve: The pulmonic valve was normal in structure. Pulmonic valve regurgitation is not visualized. Aorta: The aortic root and ascending aorta are structurally normal, with no evidence of dilitation. IAS/Shunts: The atrial septum is grossly normal.  LEFT VENTRICLE PLAX 2D LVIDd:         3.60 cm     Diastology LVIDs:         2.50 cm  LV e' medial:    7.07 cm/s LV PW:         1.70 cm     LV E/e' medial:  9.5 LV IVS:        1.30 cm     LV e' lateral:   10.60 cm/s LVOT diam:     1.90 cm     LV E/e' lateral: 6.3 LV SV:         45 LV SV Index:   23          2D Longitudinal Strain LVOT Area:     2.84 cm    2D Strain GLS Avg:     -18.1 %  LV Volumes (MOD) LV vol d, MOD A2C: 47.7 ml LV vol d, MOD A4C: 71.2 ml LV vol s, MOD A2C: 23.2 ml LV vol s, MOD A4C: 29.2 ml LV SV MOD A2C:     24.5 ml LV SV MOD A4C:     71.2 ml LV SV MOD BP:      32.5 ml RIGHT VENTRICLE             IVC RV S prime:     10.90 cm/s  IVC diam: 1.80 cm TAPSE (M-mode): 1.9 cm LEFT ATRIUM             Index      RIGHT ATRIUM            Index LA diam:        4.00 cm 2.05 cm/m RA Area:     11.70 cm LA Vol (A2C):   13.0 ml 6.66 ml/m RA Volume:   25.20 ml  12.91 ml/m LA Vol (A4C):   18.2 ml 9.32 ml/m LA Biplane Vol: 16.8 ml 8.61 ml/m  AORTIC VALVE LVOT Vmax:   89.20 cm/s LVOT Vmean:  63.900 cm/s LVOT VTI:    0.159 m  AORTA Ao Root diam: 3.30 cm Ao Asc diam:  3.20 cm MITRAL VALVE MV Area (PHT): 2.73 cm    SHUNTS MV Decel Time: 278 msec    Systemic VTI:  0.16 m MV E velocity: 67.20 cm/s  Systemic Diam: 1.90 cm MV A velocity: 50.00 cm/s MV E/A ratio:  1.34 Kristeen Miss MD Electronically signed by Kristeen Miss MD Signature Date/Time: 04/06/2020/12:47:27 PM    Final    VAS US CAROTID  Result Date: 04/06/2020 Carotid Arterial Duplex Study Indications:       CVA. Risk Factors:      Prior CVA. Comparison Study:  No prior studies. Performing Technologist: Chanda Busing RVT  Examination Guidelines: A complete evaluation includes B-mode imaging, spectral Doppler, color Doppler, and power Doppler as needed of all accessible portions of each vessel. Bilateral testing is considered an integral part of a complete examination. Limited examinations for reoccurring indications may be performed as noted.  Right Carotid Findings: +----------+--------+--------+--------+-----------------------+--------+           PSV cm/sEDV cm/sStenosisPlaque Description     Comments +----------+--------+--------+--------+-----------------------+--------+ CCA Prox  85      17              smooth and heterogenous         +----------+--------+--------+--------+-----------------------+--------+ CCA Distal42      14              smooth and heterogenous         +----------+--------+--------+--------+-----------------------+--------+ ICA Prox  38      16  smooth and heterogenous         +----------+--------+--------+--------+-----------------------+--------+ ICA Distal58      21                                               +----------+--------+--------+--------+-----------------------+--------+ ECA       87      16                                              +----------+--------+--------+--------+-----------------------+--------+ +----------+--------+-------+--------+-------------------+           PSV cm/sEDV cmsDescribeArm Pressure (mmHG) +----------+--------+-------+--------+-------------------+ [id number]                                        +----------+--------+-------+--------+-------------------+ +---------+--------+--+--------+--+---------+ VertebralPSV cm/s32EDV cm/s13Antegrade +---------+--------+--+--------+--+---------+  Left Carotid Findings: +----------+--------+--------+--------+-----------------------+--------+           PSV cm/sEDV cm/sStenosisPlaque Description     Comments +----------+--------+--------+--------+-----------------------+--------+ CCA Prox  75      15              smooth and heterogenous         +----------+--------+--------+--------+-----------------------+--------+ CCA Distal31      8               smooth and heterogenous         +----------+--------+--------+--------+-----------------------+--------+ ICA Prox  37      14              smooth and heterogenous         +----------+--------+--------+--------+-----------------------+--------+ ICA Distal84      36                                              +----------+--------+--------+--------+-----------------------+--------+ ECA       77      18                                              +----------+--------+--------+--------+-----------------------+--------+ +----------+--------+--------+--------+-------------------+           PSV cm/sEDV cm/sDescribeArm Pressure (mmHG) +----------+--------+--------+--------+-------------------+ [id number]                                          +----------+--------+--------+--------+-------------------+  +---------+--------+--+--------+--+---------+ VertebralPSV cm/s46EDV cm/s14Antegrade +---------+--------+--+--------+--+---------+   Summary: Right Carotid: Velocities in the right ICA are consistent with a 1-39% stenosis. Left Carotid: Velocities in the left ICA are consistent with a 1-39% stenosis. Vertebrals: Bilateral vertebral arteries demonstrate antegrade flow. *See table(s) above for measurements and observations.  Electronically signed by Sherald Hess MD on 04/06/2020 at 4:37:52 PM.    Final     Microbiology: No results found for this or any previous visit (from the past 240 hour(s)).   Labs: Basic Metabolic Panel: Recent Labs  Lab 04/12/20 0851 04/14/20 0951  K 4.8  --   MG 1.5*  1.9   Liver Function Tests: No results for input(s): AST, ALT, ALKPHOS, BILITOT, PROT, ALBUMIN in the last 168 hours. No results for input(s): LIPASE, AMYLASE in the last 168 hours. No results for input(s): AMMONIA in the last 168 hours. CBC: No results for input(s): WBC, NEUTROABS, HGB, HCT, MCV, PLT in the last 168 hours. Cardiac Enzymes: No results for input(s): CKTOTAL, CKMB, CKMBINDEX, TROPONINI in the last 168 hours. BNP: BNP (last 3 results) No results for input(s): BNP in the last 8760 hours.  ProBNP (last 3 results) No results for input(s): PROBNP in the last 8760 hours.  CBG: Recent Labs  Lab 04/16/20 0720 04/16/20 1207 04/16/20 1614 04/16/20 2054 04/17/20 0744  GLUCAP 126* 135* 190* 231* 145*       Signed:  Darlin Drop, MD [facility] 04/17/2020, 10:50 AM

## 2020-04-18 LAB — GLUCOSE, CAPILLARY: Glucose-Capillary: 178 mg/dL — ABNORMAL HIGH (ref 70–99)

## 2020-04-18 MED ORDER — CLOPIDOGREL BISULFATE 75 MG PO TABS: 75.0000 mg | ORAL_TABLET | Freq: Every day | ORAL | 0 refills | Status: AC

## 2020-04-18 MED ORDER — FOLIC ACID 1 MG PO TABS: 1.0000 mg | ORAL_TABLET | Freq: Every day | ORAL | 0 refills | Status: AC

## 2020-04-18 MED ORDER — GABAPENTIN 100 MG PO CAPS: 200.0000 mg | ORAL_CAPSULE | Freq: Two times a day (BID) | ORAL | 0 refills | Status: AC

## 2020-04-18 MED ORDER — ASPIRIN 325 MG PO TABS: 325.0000 mg | ORAL_TABLET | Freq: Every day | ORAL | 0 refills | Status: AC

## 2020-04-18 MED ORDER — METFORMIN HCL 1000 MG PO TABS
1000.0000 mg | ORAL_TABLET | Freq: Two times a day (BID) | ORAL | 0 refills | Status: AC
Start: 2020-04-18 — End: ?

## 2020-04-18 MED ORDER — ATORVASTATIN CALCIUM 80 MG PO TABS: 80.0000 mg | ORAL_TABLET | Freq: Every day | ORAL | 0 refills | Status: AC

## 2020-04-18 MED ORDER — TRAZODONE HCL 100 MG PO TABS
100.0000 mg | ORAL_TABLET | Freq: Every day | ORAL | 0 refills | Status: AC
Start: 2020-04-18 — End: ?

## 2020-04-18 MED ORDER — THIAMINE HCL 100 MG PO TABS: 100.0000 mg | ORAL_TABLET | Freq: Every day | ORAL | 0 refills | Status: AC

## 2020-04-18 MED ORDER — ADULT MULTIVITAMIN W/MINERALS CH: 1.0000 | ORAL_TABLET | Freq: Every day | ORAL | 0 refills | Status: AC

## 2020-04-18 MED ORDER — HUMALOG KWIKPEN 100 UNIT/ML ~~LOC~~ SOPN: 0.0000 [IU] | PEN_INJECTOR | Freq: Three times a day (TID) | SUBCUTANEOUS | 0 refills | Status: AC

## 2020-04-18 MED ORDER — ARIPIPRAZOLE 15 MG PO TABS
7.5000 mg | ORAL_TABLET | Freq: Every day | ORAL | 0 refills | Status: AC
Start: 2020-04-18 — End: 2020-07-17

## 2020-04-18 MED ORDER — BLOOD GLUCOSE METER KIT: PACK | 0 refills | Status: AC

## 2020-04-18 NOTE — TOC Progression Note (Addendum)
Transition of Care [facility]) - Progression Note    Patient Details  Name: [name] MRN: [id number] Date of Birth: 05-Jul-1961  [facility] of [facility] CM/SW Contact  Geni Bers, RN Phone Number: 04/18/2020, 10:19 AM  Clinical Narrative:    Pt is asking to go to Shelter in [facility]. Pt is asking for transportation to [facility] [address] [place], [place], [place] [address]. Shelter is open until 3:30PM. A copy of the Negative COVID result was given to pt. Pt states that he does not have any money, just need transportation to shelter. Checked with [facility] to clarify that they could transport pt to [place]. This is an one way trip with [facility]. Asked pt to call shelter for a bed. Spoke with pt concerning that this is only one way. [employer], pt understood and agreed with going to [place].   [facility] was called to inform them that pt was cleared by Psychi.   Expected Discharge Plan: Skilled Nursing Facility Barriers to Discharge: Continued Medical Work up  Expected Discharge Plan and Services Expected Discharge Plan: Skilled Nursing Facility In-house Referral: Clinical Social Work Discharge Planning Services: CM Consult Post Acute Care Choice: Skilled Nursing Facility Living arrangements for the past 2 months: Single Family Home Expected Discharge Date: 04/17/20                                     Social Determinants of Health (SDOH) Interventions    Readmission Risk Interventions No flowsheet data found.

## 2020-04-18 NOTE — Discharge Summary (Signed)
  PROGRESS NOTE  Patient ready for discharge. See DC summary completed by Dr. Margo Aye 9/21. Patient has been cleared for discharge by psychiatry. He will discharge to shelter in [place] per patient request. Transportation arranged. Patient without any new or acute medical needs this morning. VS remain stable.    Noralee Stain, DO [facility] 04/18/2020, 10:29 AM  Available via Epic secure chat 7am-7pm After these hours, please refer to coverage provider listed on [email or web address]

## 2020-04-18 NOTE — Progress Notes (Addendum)
No change from am assessment. Patient alert, oriented(x4) ambulatory without assistance. Discharge instructions reviewed by charge nurse. Questions, concerns denied at this time. Transport arranged via [facility].

## 2021-04-21 IMAGING — DX DG FOOT COMPLETE 3+V*L*
3 series · 3 of 3 positions shown · non-contrast
Comparison: None

CLINICAL DATA: LEFT great toe pain, no injury

EXAM:
LEFT FOOT - COMPLETE 3+ VIEW

[foot ap]
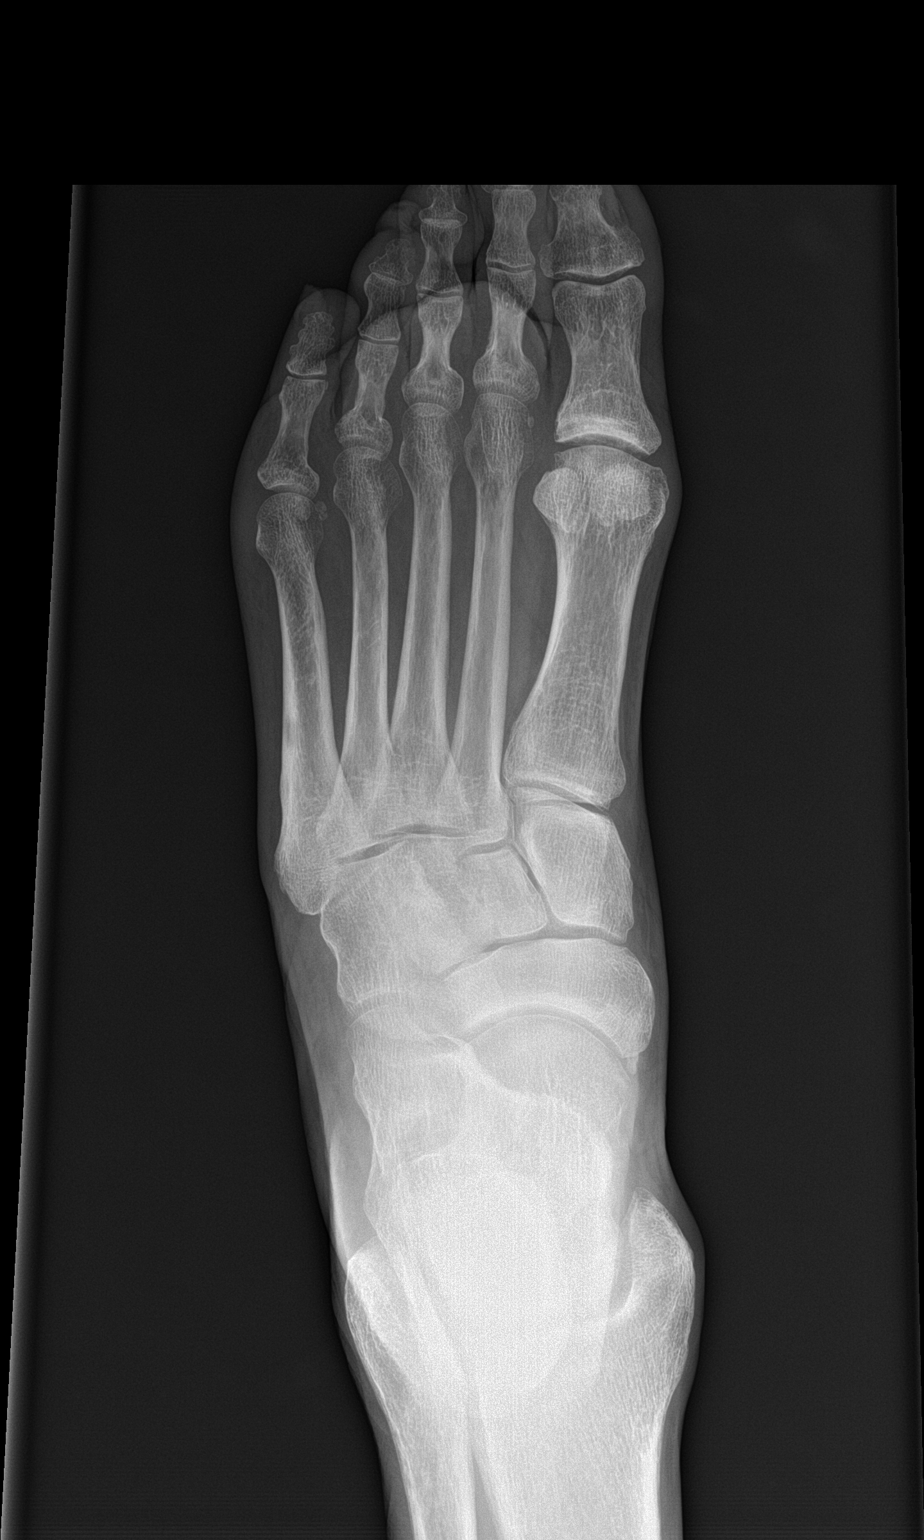

[foot obl]
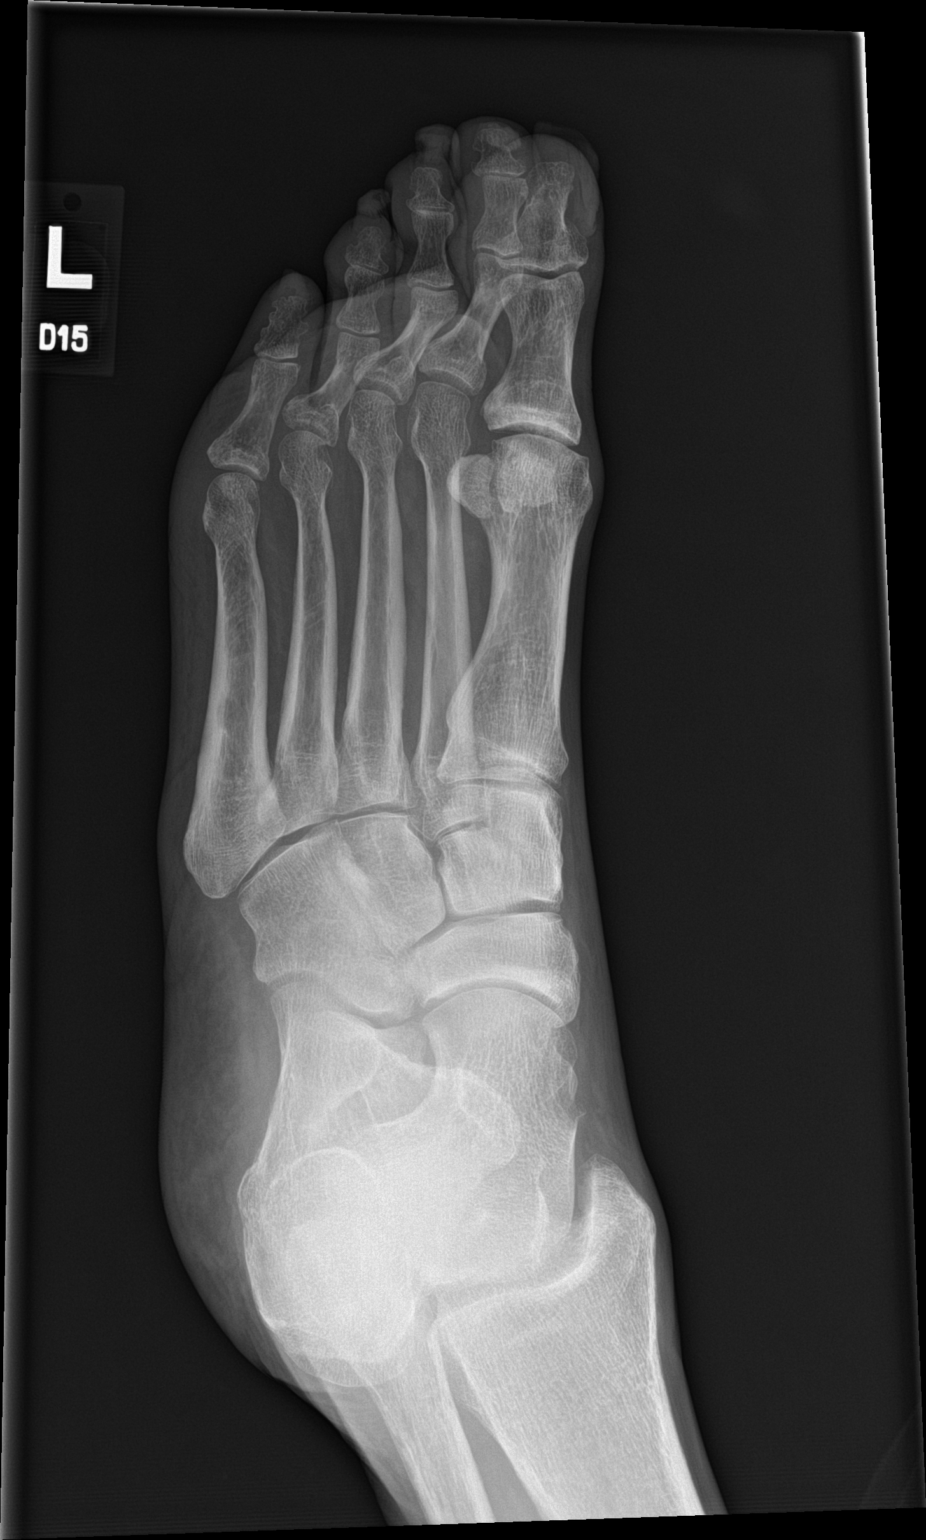

[foot lat]
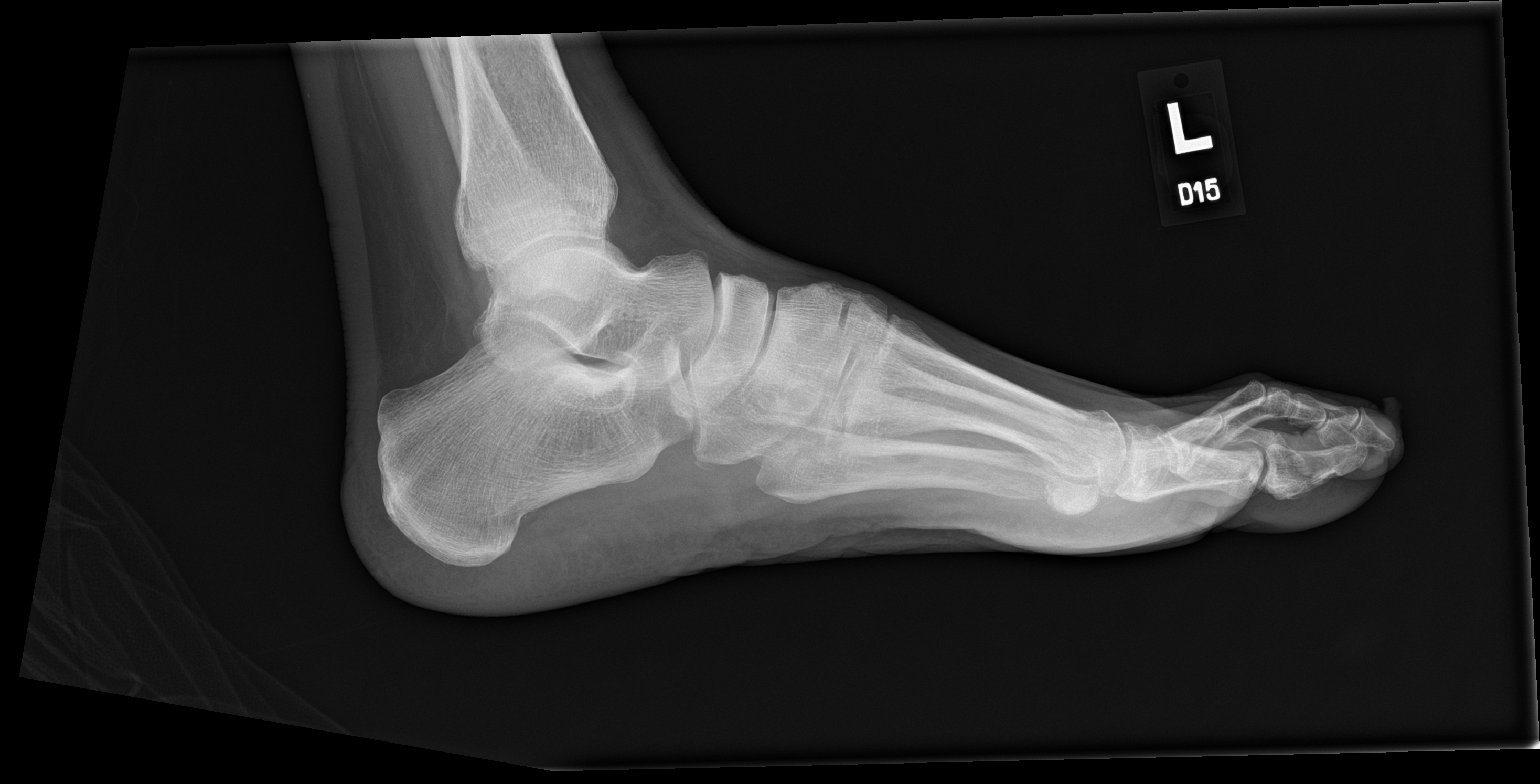

[3 of 3 positions shown; findings below may reference images not displayed]

FINDINGS: Osseous demineralization.

Joint spaces preserved.

No acute fracture, dislocation, or bone destruction.
IMPRESSION: No acute abnormalities.
